# Patient Record
Sex: Male | Born: 1965 | Race: White | Hispanic: No | Marital: Married | Smoking: Current every day smoker
Health system: Southern US, Community
[De-identification: ages and names within clinical notes are randomized; demographics above are authoritative.]

## PROBLEM LIST (undated history)

## (undated) DIAGNOSIS — Z720 Tobacco use: Secondary | ICD-10-CM

## (undated) DIAGNOSIS — I5020 Unspecified systolic (congestive) heart failure: Secondary | ICD-10-CM

## (undated) DIAGNOSIS — I2109 ST elevation (STEMI) myocardial infarction involving other coronary artery of anterior wall: Secondary | ICD-10-CM

## (undated) DIAGNOSIS — I255 Ischemic cardiomyopathy: Secondary | ICD-10-CM

## (undated) DIAGNOSIS — I10 Essential (primary) hypertension: Secondary | ICD-10-CM

## (undated) DIAGNOSIS — I2510 Atherosclerotic heart disease of native coronary artery without angina pectoris: Secondary | ICD-10-CM

---

## 2012-07-25 ENCOUNTER — Ambulatory Visit: Payer: Self-pay | Admitting: Emergency Medicine

## 2012-07-30 ENCOUNTER — Ambulatory Visit: Payer: Self-pay | Admitting: Podiatry

## 2019-06-17 ENCOUNTER — Inpatient Hospital Stay (HOSPITAL_COMMUNITY)
Admission: AD | Admit: 2019-06-17 | Discharge: 2019-07-24 | DRG: 001 | Disposition: A | Discharge status: Rehabilitation Facility | Payer: BC Managed Care – PPO | Source: Other Acute Inpatient Hospital | Attending: Cardiothoracic Surgery | Admitting: Cardiothoracic Surgery

## 2019-06-17 ENCOUNTER — Emergency Department: Payer: BC Managed Care – PPO

## 2019-06-17 ENCOUNTER — Ambulatory Visit (INDEPENDENT_AMBULATORY_CARE_PROVIDER_SITE_OTHER)
Admission: EM | Admit: 2019-06-17 | Discharge: 2019-06-17 | Disposition: A | Discharge status: Home / Self Care | Payer: BC Managed Care – PPO | Source: Home / Self Care

## 2019-06-17 ENCOUNTER — Other Ambulatory Visit: Payer: Self-pay

## 2019-06-17 ENCOUNTER — Inpatient Hospital Stay
Admission: EM | Admit: 2019-06-17 | Discharge: 2019-06-17 | DRG: 270 | Disposition: A | Discharge status: Nursing Home (Includes ICF) | Payer: BC Managed Care – PPO | Attending: Internal Medicine | Admitting: Internal Medicine

## 2019-06-17 ENCOUNTER — Encounter
Admission: EM | Disposition: A | Discharge status: Nursing Home (Includes ICF) | Payer: Self-pay | Source: Home / Self Care | Attending: Internal Medicine

## 2019-06-17 ENCOUNTER — Encounter: Payer: Self-pay | Admitting: Physician Assistant

## 2019-06-17 DIAGNOSIS — E871 Hypo-osmolality and hyponatremia: Secondary | ICD-10-CM | POA: Diagnosis not present

## 2019-06-17 DIAGNOSIS — Z8249 Family history of ischemic heart disease and other diseases of the circulatory system: Secondary | ICD-10-CM

## 2019-06-17 DIAGNOSIS — I110 Hypertensive heart disease with heart failure: Secondary | ICD-10-CM | POA: Diagnosis present

## 2019-06-17 DIAGNOSIS — Z452 Encounter for adjustment and management of vascular access device: Secondary | ICD-10-CM

## 2019-06-17 DIAGNOSIS — I213 ST elevation (STEMI) myocardial infarction of unspecified site: Secondary | ICD-10-CM | POA: Diagnosis not present

## 2019-06-17 DIAGNOSIS — I420 Dilated cardiomyopathy: Secondary | ICD-10-CM | POA: Diagnosis present

## 2019-06-17 DIAGNOSIS — I340 Nonrheumatic mitral (valve) insufficiency: Secondary | ICD-10-CM | POA: Diagnosis not present

## 2019-06-17 DIAGNOSIS — E785 Hyperlipidemia, unspecified: Secondary | ICD-10-CM | POA: Diagnosis present

## 2019-06-17 DIAGNOSIS — Y95 Nosocomial condition: Secondary | ICD-10-CM | POA: Diagnosis not present

## 2019-06-17 DIAGNOSIS — Z95811 Presence of heart assist device: Secondary | ICD-10-CM

## 2019-06-17 DIAGNOSIS — D696 Thrombocytopenia, unspecified: Secondary | ICD-10-CM | POA: Diagnosis present

## 2019-06-17 DIAGNOSIS — Z6833 Body mass index (BMI) 33.0-33.9, adult: Secondary | ICD-10-CM

## 2019-06-17 DIAGNOSIS — R112 Nausea with vomiting, unspecified: Secondary | ICD-10-CM | POA: Diagnosis not present

## 2019-06-17 DIAGNOSIS — G4730 Sleep apnea, unspecified: Secondary | ICD-10-CM | POA: Diagnosis present

## 2019-06-17 DIAGNOSIS — R531 Weakness: Secondary | ICD-10-CM | POA: Diagnosis not present

## 2019-06-17 DIAGNOSIS — N289 Disorder of kidney and ureter, unspecified: Secondary | ICD-10-CM | POA: Diagnosis present

## 2019-06-17 DIAGNOSIS — J189 Pneumonia, unspecified organism: Secondary | ICD-10-CM | POA: Diagnosis not present

## 2019-06-17 DIAGNOSIS — E874 Mixed disorder of acid-base balance: Secondary | ICD-10-CM | POA: Diagnosis not present

## 2019-06-17 DIAGNOSIS — I5021 Acute systolic (congestive) heart failure: Secondary | ICD-10-CM | POA: Diagnosis not present

## 2019-06-17 DIAGNOSIS — I2101 ST elevation (STEMI) myocardial infarction involving left main coronary artery: Secondary | ICD-10-CM | POA: Diagnosis not present

## 2019-06-17 DIAGNOSIS — F419 Anxiety disorder, unspecified: Secondary | ICD-10-CM | POA: Diagnosis not present

## 2019-06-17 DIAGNOSIS — R570 Cardiogenic shock: Secondary | ICD-10-CM | POA: Diagnosis present

## 2019-06-17 DIAGNOSIS — I272 Pulmonary hypertension, unspecified: Secondary | ICD-10-CM | POA: Diagnosis present

## 2019-06-17 DIAGNOSIS — I5022 Chronic systolic (congestive) heart failure: Secondary | ICD-10-CM | POA: Diagnosis not present

## 2019-06-17 DIAGNOSIS — R0600 Dyspnea, unspecified: Secondary | ICD-10-CM

## 2019-06-17 DIAGNOSIS — I255 Ischemic cardiomyopathy: Secondary | ICD-10-CM | POA: Diagnosis present

## 2019-06-17 DIAGNOSIS — R079 Chest pain, unspecified: Secondary | ICD-10-CM

## 2019-06-17 DIAGNOSIS — Z9889 Other specified postprocedural states: Secondary | ICD-10-CM | POA: Diagnosis not present

## 2019-06-17 DIAGNOSIS — K5900 Constipation, unspecified: Secondary | ICD-10-CM | POA: Diagnosis not present

## 2019-06-17 DIAGNOSIS — J90 Pleural effusion, not elsewhere classified: Secondary | ICD-10-CM

## 2019-06-17 DIAGNOSIS — I4901 Ventricular fibrillation: Secondary | ICD-10-CM | POA: Diagnosis not present

## 2019-06-17 DIAGNOSIS — Z7901 Long term (current) use of anticoagulants: Secondary | ICD-10-CM | POA: Diagnosis not present

## 2019-06-17 DIAGNOSIS — Z20822 Contact with and (suspected) exposure to covid-19: Secondary | ICD-10-CM | POA: Diagnosis present

## 2019-06-17 DIAGNOSIS — I10 Essential (primary) hypertension: Secondary | ICD-10-CM | POA: Diagnosis present

## 2019-06-17 DIAGNOSIS — A419 Sepsis, unspecified organism: Secondary | ICD-10-CM | POA: Diagnosis not present

## 2019-06-17 DIAGNOSIS — C641 Malignant neoplasm of right kidney, except renal pelvis: Secondary | ICD-10-CM | POA: Diagnosis present

## 2019-06-17 DIAGNOSIS — I2102 ST elevation (STEMI) myocardial infarction involving left anterior descending coronary artery: Secondary | ICD-10-CM | POA: Diagnosis not present

## 2019-06-17 DIAGNOSIS — R7401 Elevation of levels of liver transaminase levels: Secondary | ICD-10-CM | POA: Diagnosis not present

## 2019-06-17 DIAGNOSIS — Z01818 Encounter for other preprocedural examination: Secondary | ICD-10-CM

## 2019-06-17 DIAGNOSIS — R5381 Other malaise: Secondary | ICD-10-CM | POA: Diagnosis not present

## 2019-06-17 DIAGNOSIS — E876 Hypokalemia: Secondary | ICD-10-CM | POA: Diagnosis not present

## 2019-06-17 DIAGNOSIS — Z515 Encounter for palliative care: Secondary | ICD-10-CM | POA: Diagnosis not present

## 2019-06-17 DIAGNOSIS — I472 Ventricular tachycardia: Secondary | ICD-10-CM | POA: Diagnosis not present

## 2019-06-17 DIAGNOSIS — J9601 Acute respiratory failure with hypoxia: Secondary | ICD-10-CM | POA: Diagnosis not present

## 2019-06-17 DIAGNOSIS — K7200 Acute and subacute hepatic failure without coma: Secondary | ICD-10-CM | POA: Diagnosis not present

## 2019-06-17 DIAGNOSIS — F17210 Nicotine dependence, cigarettes, uncomplicated: Secondary | ICD-10-CM | POA: Diagnosis present

## 2019-06-17 DIAGNOSIS — Z4509 Encounter for adjustment and management of other cardiac device: Secondary | ICD-10-CM

## 2019-06-17 DIAGNOSIS — I2109 ST elevation (STEMI) myocardial infarction involving other coronary artery of anterior wall: Secondary | ICD-10-CM | POA: Diagnosis present

## 2019-06-17 DIAGNOSIS — J181 Lobar pneumonia, unspecified organism: Secondary | ICD-10-CM | POA: Diagnosis not present

## 2019-06-17 DIAGNOSIS — I77819 Aortic ectasia, unspecified site: Secondary | ICD-10-CM | POA: Diagnosis present

## 2019-06-17 DIAGNOSIS — I5023 Acute on chronic systolic (congestive) heart failure: Secondary | ICD-10-CM

## 2019-06-17 DIAGNOSIS — I2510 Atherosclerotic heart disease of native coronary artery without angina pectoris: Secondary | ICD-10-CM | POA: Diagnosis present

## 2019-06-17 DIAGNOSIS — E669 Obesity, unspecified: Secondary | ICD-10-CM | POA: Diagnosis present

## 2019-06-17 DIAGNOSIS — I442 Atrioventricular block, complete: Secondary | ICD-10-CM | POA: Diagnosis not present

## 2019-06-17 DIAGNOSIS — R42 Dizziness and giddiness: Secondary | ICD-10-CM | POA: Diagnosis not present

## 2019-06-17 DIAGNOSIS — K5901 Slow transit constipation: Secondary | ICD-10-CM

## 2019-06-17 DIAGNOSIS — Z7189 Other specified counseling: Secondary | ICD-10-CM | POA: Diagnosis not present

## 2019-06-17 DIAGNOSIS — Z9289 Personal history of other medical treatment: Secondary | ICD-10-CM

## 2019-06-17 DIAGNOSIS — I959 Hypotension, unspecified: Secondary | ICD-10-CM | POA: Diagnosis present

## 2019-06-17 DIAGNOSIS — I509 Heart failure, unspecified: Secondary | ICD-10-CM | POA: Diagnosis not present

## 2019-06-17 DIAGNOSIS — Z01810 Encounter for preprocedural cardiovascular examination: Secondary | ICD-10-CM | POA: Diagnosis not present

## 2019-06-17 DIAGNOSIS — D649 Anemia, unspecified: Secondary | ICD-10-CM | POA: Diagnosis present

## 2019-06-17 DIAGNOSIS — Z9911 Dependence on respirator [ventilator] status: Secondary | ICD-10-CM | POA: Diagnosis not present

## 2019-06-17 DIAGNOSIS — I952 Hypotension due to drugs: Secondary | ICD-10-CM | POA: Diagnosis not present

## 2019-06-17 HISTORY — PX: LEFT HEART CATH AND CORONARY ANGIOGRAPHY: CATH118249

## 2019-06-17 HISTORY — DX: Essential (primary) hypertension: I10

## 2019-06-17 HISTORY — DX: Unspecified systolic (congestive) heart failure: I50.20

## 2019-06-17 HISTORY — PX: CORONARY/GRAFT ACUTE MI REVASCULARIZATION: CATH118305

## 2019-06-17 HISTORY — DX: ST elevation (STEMI) myocardial infarction involving other coronary artery of anterior wall: I21.09

## 2019-06-17 HISTORY — DX: Tobacco use: Z72.0

## 2019-06-17 HISTORY — PX: IABP INSERTION: CATH118242

## 2019-06-17 HISTORY — PX: RIGHT HEART CATH: CATH118263

## 2019-06-17 HISTORY — DX: Atherosclerotic heart disease of native coronary artery without angina pectoris: I25.10

## 2019-06-17 HISTORY — DX: Ischemic cardiomyopathy: I25.5

## 2019-06-17 LAB — BASIC METABOLIC PANEL
Anion gap: 12 (ref 5–15)
BUN: 17 mg/dL (ref 6–20)
CO2: 20 mmol/L — ABNORMAL LOW (ref 22–32)
Calcium: 8.6 mg/dL — ABNORMAL LOW (ref 8.9–10.3)
Chloride: 105 mmol/L (ref 98–111)
Creatinine, Ser: 0.95 mg/dL (ref 0.61–1.24)
GFR calc Af Amer: >60 mL/min (ref >60)
GFR calc non Af Amer: >60 mL/min (ref >60)
Glucose, Bld: 168 mg/dL — ABNORMAL HIGH (ref 70–99)
Potassium: 3.9 mmol/L (ref 3.5–5.1)
Sodium: 137 mmol/L (ref 135–145)

## 2019-06-17 LAB — TROPONIN I (HIGH SENSITIVITY): Troponin I (High Sensitivity): 202 ng/L (ref <18)

## 2019-06-17 LAB — CBC WITH DIFFERENTIAL/PLATELET
Abs Immature Granulocytes: 0.05 10*3/uL (ref 0.00–0.07)
Basophils Absolute: 0 10*3/uL (ref 0.0–0.1)
Basophils Relative: 0 %
Eosinophils Absolute: 0 10*3/uL (ref 0.0–0.5)
Eosinophils Relative: 0 %
HCT: 47.4 % (ref 39.0–52.0)
Hemoglobin: 16.6 g/dL (ref 13.0–17.0)
Immature Granulocytes: 0 %
Lymphocytes Relative: 10 %
Lymphs Abs: 1.2 10*3/uL (ref 0.7–4.0)
MCH: 33.7 pg (ref 26.0–34.0)
MCHC: 35 g/dL (ref 30.0–36.0)
MCV: 96.1 fL (ref 80.0–100.0)
Monocytes Absolute: 0.5 10*3/uL (ref 0.1–1.0)
Monocytes Relative: 4 %
Neutro Abs: 10.7 10*3/uL — ABNORMAL HIGH (ref 1.7–7.7)
Neutrophils Relative %: 86 %
Platelets: 172 10*3/uL (ref 150–400)
RBC: 4.93 MIL/uL (ref 4.22–5.81)
RDW: 12.7 % (ref 11.5–15.5)
WBC: 12.5 10*3/uL — ABNORMAL HIGH (ref 4.0–10.5)
nRBC: 0 % (ref 0.0–0.2)

## 2019-06-17 LAB — SARS CORONAVIRUS 2 BY RT PCR (HOSPITAL ORDER, PERFORMED IN ~~LOC~~ HOSPITAL LAB): SARS Coronavirus 2: NEGATIVE

## 2019-06-17 LAB — HEPATIC FUNCTION PANEL
ALT: 161 U/L — ABNORMAL HIGH (ref 0–44)
AST: 196 U/L — ABNORMAL HIGH (ref 15–41)
Albumin: 3.7 g/dL (ref 3.5–5.0)
Alkaline Phosphatase: 40 U/L (ref 38–126)
Bilirubin, Direct: 0.3 mg/dL — ABNORMAL HIGH (ref 0.0–0.2)
Indirect Bilirubin: 1.1 mg/dL — ABNORMAL HIGH (ref 0.3–0.9)
Total Bilirubin: 1.4 mg/dL — ABNORMAL HIGH (ref 0.3–1.2)
Total Protein: 6.3 g/dL — ABNORMAL LOW (ref 6.5–8.1)

## 2019-06-17 LAB — APTT: aPTT: 25 seconds (ref 24–36)

## 2019-06-17 LAB — POCT ACTIVATED CLOTTING TIME: Activated Clotting Time: 324 seconds

## 2019-06-17 LAB — BRAIN NATRIURETIC PEPTIDE: B Natriuretic Peptide: 751.1 pg/mL — ABNORMAL HIGH (ref 0.0–100.0)

## 2019-06-17 LAB — PROTIME-INR
INR: 1 (ref 0.8–1.2)
Prothrombin Time: 12.8 seconds (ref 11.4–15.2)

## 2019-06-17 SURGERY — CORONARY/GRAFT ACUTE MI REVASCULARIZATION
Anesthesia: Moderate Sedation

## 2019-06-17 MED ORDER — HEPARIN (PORCINE) 25000 UT/250ML-% IV SOLN: 1200.0000 [IU]/h | INTRAVENOUS | 0 refills | Status: DC

## 2019-06-17 MED ORDER — SODIUM CHLORIDE 0.9 % IV SOLN
INTRAVENOUS | Status: AC | PRN
  Administered 2019-06-17: 10 mL/h via INTRAVENOUS

## 2019-06-17 MED ORDER — HYDRALAZINE HCL 20 MG/ML IJ SOLN: 10.0000 mg | INTRAMUSCULAR | Status: AC | PRN

## 2019-06-17 MED ORDER — SODIUM CHLORIDE 0.9% FLUSH
3.0000 mL | Freq: Two times a day (BID) | INTRAVENOUS | Status: DC
  Administered 2019-06-18 – 2019-06-23 (×5): 3 mL via INTRAVENOUS

## 2019-06-17 MED ORDER — SODIUM CHLORIDE 0.9% IV SOLUTION: INTRAVENOUS | Status: DC | PRN

## 2019-06-17 MED ORDER — CHLORHEXIDINE GLUCONATE CLOTH 2 % EX PADS: 6.0000 | MEDICATED_PAD | Freq: Every day | CUTANEOUS | Status: DC

## 2019-06-17 MED ORDER — FUROSEMIDE 10 MG/ML IJ SOLN
INTRAMUSCULAR | Status: AC
  Filled 2019-06-17: qty 4

## 2019-06-17 MED ORDER — MORPHINE SULFATE (PF) 4 MG/ML IV SOLN: 4.0000 mg | Freq: Once | INTRAVENOUS | Status: AC

## 2019-06-17 MED ORDER — NOREPINEPHRINE BITARTRATE 1 MG/ML IV SOLN
INTRAVENOUS | Status: AC
  Filled 2019-06-17: qty 4

## 2019-06-17 MED ORDER — ASPIRIN 81 MG PO CHEW
324.0000 mg | CHEWABLE_TABLET | Freq: Once | ORAL | Status: AC
  Administered 2019-06-17: 324 mg via ORAL

## 2019-06-17 MED ORDER — ASPIRIN 81 MG PO CHEW
81.0000 mg | CHEWABLE_TABLET | Freq: Every day | ORAL | Status: DC
  Administered 2019-06-18 – 2019-06-24 (×7): 81 mg via ORAL
  Filled 2019-06-17 (×4): qty 1

## 2019-06-17 MED ORDER — SODIUM CHLORIDE 0.9 % IV SOLN: INTRAVENOUS | Status: DC

## 2019-06-17 MED ORDER — HEPARIN (PORCINE) 25000 UT/250ML-% IV SOLN
1200.0000 [IU]/h | INTRAVENOUS | Status: DC
  Administered 2019-06-17: 1200 [IU]/h via INTRAVENOUS

## 2019-06-17 MED ORDER — IOHEXOL 300 MG/ML  SOLN
INTRAMUSCULAR | Status: DC | PRN
  Administered 2019-06-17: 260 mL

## 2019-06-17 MED ORDER — HEPARIN BOLUS VIA INFUSION
4000.0000 [IU] | Freq: Once | INTRAVENOUS | Status: AC
  Administered 2019-06-17: 4000 [IU] via INTRAVENOUS
  Filled 2019-06-17: qty 4000

## 2019-06-17 MED ORDER — LABETALOL HCL 5 MG/ML IV SOLN: 10.0000 mg | INTRAVENOUS | Status: AC | PRN

## 2019-06-17 MED ORDER — FENTANYL CITRATE (PF) 100 MCG/2ML IJ SOLN
INTRAMUSCULAR | Status: DC | PRN
  Administered 2019-06-17 (×2): 25 ug via INTRAVENOUS

## 2019-06-17 MED ORDER — ONDANSETRON HCL 4 MG/2ML IJ SOLN
INTRAMUSCULAR | Status: AC
  Administered 2019-06-17: 4 mg via INTRAVENOUS
  Filled 2019-06-17: qty 2

## 2019-06-17 MED ORDER — BIVALIRUDIN BOLUS VIA INFUSION - CUPID
INTRAVENOUS | Status: DC | PRN
  Administered 2019-06-17: 76.2 mg via INTRAVENOUS

## 2019-06-17 MED ORDER — ONDANSETRON HCL 4 MG/2ML IJ SOLN: 4.0000 mg | Freq: Four times a day (QID) | INTRAMUSCULAR | Status: DC | PRN

## 2019-06-17 MED ORDER — SODIUM CHLORIDE 0.9 % IV SOLN: 250.0000 mL | INTRAVENOUS | Status: DC | PRN

## 2019-06-17 MED ORDER — SODIUM CHLORIDE 0.9% FLUSH: 3.0000 mL | INTRAVENOUS | Status: DC | PRN

## 2019-06-17 MED ORDER — SODIUM CHLORIDE 0.9% IV SOLUTION: INTRAVENOUS | Status: DC

## 2019-06-17 MED ORDER — HEPARIN (PORCINE) IN NACL 1000-0.9 UT/500ML-% IV SOLN
INTRAVENOUS | Status: DC | PRN
  Administered 2019-06-17: 1500 mL

## 2019-06-17 MED ORDER — BIVALIRUDIN TRIFLUOROACETATE 250 MG IV SOLR
INTRAVENOUS | Status: AC
  Filled 2019-06-17: qty 250

## 2019-06-17 MED ORDER — FUROSEMIDE 10 MG/ML IJ SOLN
INTRAMUSCULAR | Status: DC | PRN
  Administered 2019-06-17: 40 mg via INTRAVENOUS

## 2019-06-17 MED ORDER — MORPHINE SULFATE (PF) 4 MG/ML IV SOLN
INTRAVENOUS | Status: AC
  Administered 2019-06-17: 4 mg via INTRAVENOUS
  Filled 2019-06-17: qty 1

## 2019-06-17 MED ORDER — ONDANSETRON HCL 4 MG/2ML IJ SOLN: 4.0000 mg | Freq: Once | INTRAMUSCULAR | Status: AC

## 2019-06-17 MED ORDER — ACETAMINOPHEN 325 MG PO TABS: 650.0000 mg | ORAL_TABLET | ORAL | Status: DC | PRN

## 2019-06-17 MED ORDER — MIDAZOLAM HCL 2 MG/2ML IJ SOLN
INTRAMUSCULAR | Status: AC
  Filled 2019-06-17: qty 2

## 2019-06-17 MED ORDER — NITROGLYCERIN 0.4 MG SL SUBL
0.4000 mg | SUBLINGUAL_TABLET | SUBLINGUAL | Status: DC | PRN
  Administered 2019-06-17 (×2): 0.4 mg via SUBLINGUAL

## 2019-06-17 MED ORDER — IOHEXOL 350 MG/ML SOLN
100.0000 mL | Freq: Once | INTRAVENOUS | Status: AC | PRN
  Administered 2019-06-17: 100 mL via INTRAVENOUS

## 2019-06-17 MED ORDER — HEPARIN (PORCINE) 25000 UT/250ML-% IV SOLN
INTRAVENOUS | Status: AC
  Filled 2019-06-17: qty 250

## 2019-06-17 MED ORDER — SODIUM CHLORIDE 0.9 % IV SOLN
INTRAVENOUS | Status: DC | PRN
  Administered 2019-06-17: 1.75 mg/kg/h via INTRAVENOUS

## 2019-06-17 MED ORDER — FENTANYL CITRATE (PF) 100 MCG/2ML IJ SOLN
INTRAMUSCULAR | Status: AC
  Filled 2019-06-17: qty 2

## 2019-06-17 SURGICAL SUPPLY — 20 items
BALLN EUPHORA RX 2.5X15 (BALLOONS)
BALLN IABP SENSA PLUS 8F 50CC (BALLOONS) ×3
BALLOON EUPHORA RX 2.5X15 (BALLOONS) IMPLANT
BALLOON IABP SENS PLUS 8F 50CC (BALLOONS) ×1 IMPLANT
CATH INFINITI 5FR JL4 (CATHETERS) ×3 IMPLANT
CATH INFINITI JR4 5F (CATHETERS) ×3 IMPLANT
CATH SWANZ 7F THERMO (CATHETERS) ×3 IMPLANT
CATH VISTA GUIDE 6FR XB3.5 (CATHETERS) ×3 IMPLANT
DEVICE INFLAT 30 PLUS (MISCELLANEOUS) ×3 IMPLANT
GUIDEWIRE EMER 3M J .025X150CM (WIRE) ×3 IMPLANT
KIT MANI 3VAL PERCEP (MISCELLANEOUS) ×3 IMPLANT
NEEDLE PERC 18GX7CM (NEEDLE) ×3 IMPLANT
PACK CARDIAC CATH (CUSTOM PROCEDURE TRAY) ×3 IMPLANT
PROTECTION STATION PRESSURIZED (MISCELLANEOUS) ×3
SHEATH AVANTI 6FR X 11CM (SHEATH) ×3 IMPLANT
SHEATH AVANTI 7FRX11 (SHEATH) ×3 IMPLANT
SLEEVE REPOSITIONING LENGTH 30 (MISCELLANEOUS) ×3 IMPLANT
STATION PROTECTION PRESSURIZED (MISCELLANEOUS) ×1 IMPLANT
WIRE G HI TQ BMW 190 (WIRE) ×6 IMPLANT
WIRE GUIDERIGHT .035X150 (WIRE) ×3 IMPLANT

## 2019-06-17 NOTE — ED Triage Notes (Signed)
Pt to ED with sudden onset of chest pain at 1700 tonight while at work. Pain feels like pressure and radiating to his left arm and back. No cardiac hx. 324 ASA and 4 Nitro given with no relief. Pt arrived to ED on 2L of oxygen via [place] due to reported SOB but no hypoxia on RA per EMS. Pt is diaphoretic but alert and oriented.

## 2019-06-17 NOTE — ED Notes (Signed)
Left arm numbness and abd pain also reported.

## 2019-06-17 NOTE — ED Notes (Signed)
Pt arrived back to ED treatment room from CT. Cardiologist at bedside with pt and patient's sister.

## 2019-06-17 NOTE — ED Provider Notes (Signed)
[facility]    CSN: [id number] Arrival date & time: 06/17/19  1851      History   Chief Complaint Chief Complaint  Patient presents with  . Chest Pain    HPI Aaron [name] is a 54 y.o. male.   patient is a 54 y.o. male with a history of hypertension who presented with sudden onset of left sided chest pain.  Patient states that he was at work when he had sudden onset of severe chest pain in the middle of his chest radiating to his back and down his left arm.  Patient also reported tingling sensation in his left arm as well.  He denies having this previously.  Patient rated pain at a 7-9/10 and described as a tightness/pressure.  Patient reports she has not been on any blood pressure medications for at least a year.  Patient also presenting with shortness of breath and diaphoresis.     Past medical history of hypertension Patient Active Problem List   Diagnosis Date Noted  . STEMI (ST elevation myocardial infarction) ([place]) 06/17/2019    History reviewed. No pertinent surgical history.     Home Medications    Prior to Admission medications   Not on File    Family History History reviewed. No pertinent family history.  Social History Social History   Tobacco Use  . Smoking status: Current Every Day Smoker    Packs/day: 1.50    Years: 30.00    Pack years: 45.00  . Smokeless tobacco: Never Used  Substance Use Topics  . Alcohol use: Yes    Alcohol/week: 7.0 - 14.0 standard drinks    Types: 7 - 14 Standard drinks or equivalent per week  . Drug use: Not on file     Allergies   Patient has no known allergies.   Review of Systems Review of Systems as noted above in HPI.  Other systems reviewed and found to be negative   Physical Exam Triage Vital Signs ED Triage Vitals  Enc Vitals Group     BP 06/17/19 1906 (!) 127/103     Pulse Rate 06/17/19 1906 96     Resp 06/17/19 1906 (!) 24     Temp 06/17/19 1906 (!) 97.4 F (36.3 C)     Temp  Source 06/17/19 1906 Oral     SpO2 06/17/19 1906 100 %     Weight --      Height --      Head Circumference --      Peak Flow --      Pain Score 06/17/19 1905 8     Pain Loc --      Pain Edu? --      Excl. in [place]? --    No data found.  Updated Vital Signs BP 129/87   Pulse 94   Temp (!) 97.4 F (36.3 C) (Oral)   Resp 20   SpO2 99%    Physical Exam Constitutional:      Appearance: He is well-developed.     Comments: Diaphoretic with increased work of breathing  HENT:     Head: Normocephalic.  Eyes:     Pupils: Pupils are equal, round, and reactive to light.  Cardiovascular:     Rate and Rhythm: Normal rate and regular rhythm.     Pulses:          Carotid pulses are 2+ on the right side and 2+ on the left side.      Radial  pulses are 2+ on the right side and 2+ on the left side.       Dorsalis pedis pulses are 2+ on the right side and 2+ on the left side.       Posterior tibial pulses are 2+ on the right side and 2+ on the left side.     Heart sounds: Normal heart sounds. No murmur heard.   Pulmonary:     Effort: Pulmonary effort is normal. Tachypnea present. No respiratory distress.     Breath sounds: Normal breath sounds. No stridor.  Chest:     Chest wall: No mass, deformity or tenderness.  Abdominal:     General: Bowel sounds are normal.     Palpations: Abdomen is soft.  Musculoskeletal:        General: Normal range of motion.  Skin:    General: Skin is warm.     Capillary Refill: Capillary refill takes less than 2 seconds.     Comments: Diaphoretic  Neurological:     General: No focal deficit present.     Mental Status: He is alert and oriented to person, place, and time.      UC Treatments / Results  Labs (all labs ordered are listed, but only abnormal results are displayed) Labs Reviewed - No data to display  EKG ED ECG REPORT   Date: 06/17/2019  EKG Time: 7:11 PM  Rate: 96  Rhythm: normal sinus rhythm,  ST depression in 2,3,aVF, V4-V6. ST  elevation in aVR, V1-V3 Intervals:first-degree A-V block   Narrative Interpretation: 1st degree AV block.  Anterior infarct, possibly acute.  [place] ST abnormality, possible inferolateral subendocardial injury.  ACUTE MI.  Normal EKG     Radiology None in urgent care  Procedures Critical care Critical care time of 40 minutes.  Great care was necessary to treat or prevent imminent or life-threatening deterioration due to cardiac failure in the setting of acute STEMI.  Critical care time spent evaluating EKG, ordering medications, administering medications, discussion with nursing staff and patient.  Evaluation of patient exam, condition and vital signs.   Medications Ordered in UC Medications  aspirin chewable tablet 324 mg (324 mg Oral Given 06/17/19 1921)    Initial Impression / Assessment and Plan / UC Course  I have reviewed the triage vital signs and the nursing notes.  Pertinent labs & imaging results that were available during my care of the patient were reviewed by me and considered in my medical decision making (see chart for details).    Patient presents with left-sided chest pain radiating down left arm that began approximately 1 hour prior to arrival with increased shortness of breath and diaphoresis.  EKG done on arrival with ST elevations in V1 through V3 and AVR.  Reciprocal ST depression in leads II, 3, aVF, V4 through V6.  911 called for emergency transport as soon as EKG obtained.  Patient given 324 mg aspirin.  Patient given nitroglycerin sublingual x3 5 minutes apart.  No improvement in pain after the first 2.  Patient reported minimal improvement in pain after the third tablet.  Blood pressure after the second tablet of 129/87.  Blood pressure after the second time of 128/77.  Patient placed on nasal cannula at 4 L with saturations 94-96% heart rate 94. Final Clinical Impressions(s) / UC Diagnoses   Final diagnoses:  ST elevation myocardial infarction (STEMI),  unspecified artery [facility])     Discharge Instructions     EMS called after EKG obtained -Patient transferred  to [place] regional ER by EMS    ED Prescriptions    None     PDMP not reviewed this encounter.   Luvenia Redden, PA-C 06/17/19 2159

## 2019-06-17 NOTE — ED Provider Notes (Signed)
[facility] Emergency Department Provider Note  ____________________________________________   First MD Initiated Contact with Patient 06/17/19 1955     (approximate)  I have reviewed the triage vital signs and the nursing notes.   HISTORY  Chief Complaint Chest Pain    HPI Aaron [name] is a 54 y.o. male who is not seen a doctor in many years with a history of hypertension who comes in with sudden onset of chest pain.  Patient states that he was exerting himself when he had sudden onset of severe chest pain in the middle of his chest radiating to his back and down his left arm.  He states it is associate with a tingling sensation in his left arm and left leg as well as pain in his abdomen as well.  He denies having this previously.  Is been constant, severe, nothing makes it better, nothing makes it worse.  EKG with EMS was concerning for acute MI with ST elevation in V1 V2 V3 with ST depression in 2 3 aVF V4 through V6 so STEMI was initially activated in the field.  Patient denies any history of GI bleeding.  He states he had a little bit of shortness of breath for the past few days.    Medical.  Patient reports prior hypertension but denies being on any medicines.     There are no problems to display for this patient.   Prior to Admission medications   Not on File    Allergies Patient has no known allergies.  No family history on file.  Social History Due to acuity of the situation unable to get full social history   Review of Systems Constitutional: No fever/chills Eyes: No visual changes. ENT: No sore throat. Cardiovascular: Positive chest pain Respiratory: Positive shortness of breath Gastrointestinal: Positive abdominal pain no nausea, no vomiting.  No diarrhea.  No constipation. Genitourinary: Negative for dysuria. Musculoskeletal: Negative for back pain. Skin: Negative for rash. Neurological: Negative for headaches, focal  weakness.  Positive for tingling on the left side of his body All other ROS negative ____________________________________________   PHYSICAL EXAM:  VITAL SIGNS: ED Triage Vitals [06/17/19 1956]  Enc Vitals Group     BP      Pulse Rate 93     Resp (!) 25     Temp 97.8 F (36.6 C)     Temp Source Oral     SpO2 95 %     Weight 223 lb 15.8 oz (101.6 kg)     Height 5\' 9"  (1.753 m)     Head Circumference      Peak Flow      Pain Score 9     Pain Loc      Pain Edu?      Excl. in [place]?     Constitutional: Alert and oriented. Diaphoretic, appears in pain  Eyes: Conjunctivae are normal. EOMI. Head: Atraumatic. Nose: No congestion/rhinnorhea. Mouth/Throat: Mucous membranes are moist.   Neck: No stridor. Trachea Midline. FROM Cardiovascular: Normal rate, regular rhythm. Grossly normal heart sounds.  Good peripheral circulation. Respiratory: Normal respiratory effort.  No retractions. Lungs CTAB. Gastrointestinal: Soft and nontender. No distention. No abdominal bruits.  Musculoskeletal: No lower extremity tenderness nor edema.  No joint effusions. Neurologic:  Normal speech and language. No gross focal neurologic deficits are appreciated.  Skin:  Skin is warm, dry and intact. No rash noted. Psychiatric: Mood and affect are normal. Speech and behavior are normal. GU: Deferred  ____________________________________________   LABS (all labs ordered are listed, but only abnormal results are displayed)  Labs Reviewed  CBC WITH DIFFERENTIAL/PLATELET - Abnormal; Notable for the following components:      Result Value   WBC 12.5 (*)    Neutro Abs 10.7 (*)    All other components within normal limits  SARS CORONAVIRUS 2 BY RT PCR (HOSPITAL ORDER, [place] [facility])  BASIC METABOLIC PANEL  BRAIN NATRIURETIC PEPTIDE  HEPATIC FUNCTION PANEL  PROTIME-INR  APTT  TROPONIN I (HIGH SENSITIVITY)   ____________________________________________   ED ECG REPORT I, Vanessa Shields, the attending physician, personally viewed and interpreted this ECG.  EKG shows ST elevation in aVR V1 through V3 with ST depression in V4 through V6, 2 3 aVF, type I AV block ____________________________________________  RADIOLOGY   Official radiology report(s): CT Angio Chest/Abd/Pel for Dissection W and/or Wo Contrast  Result Date: 06/17/2019 CLINICAL DATA:  Thoracic aortic disease, post repair. Chest pain, left arm pain. EXAM: CT ANGIOGRAPHY CHEST, ABDOMEN AND PELVIS TECHNIQUE: Non-contrast CT of the chest was initially obtained. Multidetector CT imaging through the chest, abdomen and pelvis was performed using the standard protocol during bolus administration of intravenous contrast. Multiplanar reconstructed images and MIPs were obtained and reviewed to evaluate the vascular anatomy. CONTRAST:  143mL OMNIPAQUE IOHEXOL 350 MG/ML SOLN COMPARISON:  None. FINDINGS: CTA CHEST FINDINGS Cardiovascular: Mild cardiomegaly. Diffuse coronary artery calcifications. No visible aortic aneurysm or dissection. No evidence of prior repair. Maximum aortic diameter 3.6 cm in the ascending thoracic aorta. Mediastinum/Nodes: No mediastinal, hilar, or axillary adenopathy. Trachea and esophagus are unremarkable. Thyroid unremarkable. Lungs/Pleura: Lungs are clear. No focal airspace opacities or suspicious nodules. No effusions. Musculoskeletal: Chest wall soft tissues are unremarkable. No acute bony abnormality. Review of the MIP images confirms the above findings. CTA ABDOMEN AND PELVIS FINDINGS VASCULAR Aorta: Infrarenal aortic atherosclerosis. No aneurysm or dissection. Celiac: Patent without evidence of aneurysm, dissection, vasculitis or significant stenosis. SMA: Patent without evidence of aneurysm, dissection, vasculitis or significant stenosis. Renals: Both renal arteries are patent without evidence of aneurysm, dissection, vasculitis, fibromuscular dysplasia or significant stenosis. IMA: Patent without  evidence of aneurysm, dissection, vasculitis or significant stenosis. Inflow: Patent without evidence of aneurysm, dissection, vasculitis or significant stenosis. Scattered calcifications. Veins: No obvious venous abnormality within the limitations of this arterial phase study. Review of the MIP images confirms the above findings. NON-VASCULAR Hepatobiliary: Suspect diffuse fatty infiltration of the liver. No focal hepatic abnormality. Gallbladder unremarkable. Pancreas: No focal abnormality or ductal dilatation. Spleen: No focal abnormality.  Normal size. Adrenals/Urinary Tract: Low-density area noted in the midpole of the right kidney measuring 2.4 cm concerning for enhancing lesion. Small cyst off the lower pole of the right kidney measures 11 mm. No hydronephrosis. Adrenal glands and urinary bladder are unremarkable. Stomach/Bowel: Normal appendix. Stomach, large and small bowel grossly unremarkable. Lymphatic: No adenopathy Reproductive: No visible focal abnormality. Other: No free fluid or free air. Musculoskeletal: No acute bony abnormality. Review of the MIP images confirms the above findings. IMPRESSION: No evidence of aortic aneurysm or dissection. No visible evidence of prior repair. Diffuse coronary artery calcifications.  Aortic atherosclerosis. No acute cardiopulmonary disease. Concern for 2.4 cm enhancing lesion within the midpole of the right kidney. Cannot exclude renal cell carcinoma. When the patient is clinically stable and able to follow directions and hold their breath (preferably as an outpatient) further evaluation with dedicated abdominal MRI should be considered. Electronically Signed   By: Lennette Bihari  [place] M.D.   On: 06/17/2019 20:29    ____________________________________________   PROCEDURES  Procedure(s) performed (including Critical Care):  .Critical Care Performed by: Vanessa [place], MD Authorized by: Vanessa [place], MD   Critical care provider statement:    Critical care  time (minutes):  31   Critical care was necessary to treat or prevent imminent or life-threatening deterioration of the following conditions:  Cardiac failure   Critical care was time spent personally by me on the following activities:  Discussions with consultants, evaluation of patient's response to treatment, examination of patient, ordering and performing treatments and interventions, ordering and review of laboratory studies, ordering and review of radiographic studies, pulse oximetry, re-evaluation of patient's condition, obtaining history from patient or surrogate and review of old charts .1-3 Lead EKG Interpretation Performed by: Vanessa , MD Authorized by: Vanessa , MD     Interpretation: normal     ECG rate:  90s   ECG rate assessment: normal     Rhythm: sinus rhythm     Ectopy: none     Conduction: normal       ____________________________________________   INITIAL IMPRESSION / ASSESSMENT AND PLAN / ED COURSE   Vannie Hochstetler Guidotti was evaluated in Emergency Department on 06/17/2019 for the symptoms described in the history of present illness. He was evaluated in the context of the global COVID-19 pandemic, which necessitated consideration that the patient might be at risk for infection with the SARS-CoV-2 virus that causes COVID-19. Institutional protocols and algorithms that pertain to the evaluation of patients at risk for COVID-19 are in a state of rapid change based on information released by regulatory bodies including the CDC and federal and state organizations. These policies and algorithms were followed during the patient's care in the ED.    Patient comes in as STEMI activation however his story is concerning for the possibility of dissection.  Discussed with Dr. Clayborn Bigness.  Patient has no recent BMP but given the significant EKG changes in the setting of significant concerning story I think it is best to proceed with CT dissection before heparinizing patient.  I  discussed the case with Dr. Clayborn Bigness and he also agrees.  Patient himself consents for CT without waiting for kidney function back.  He understands there is a risk to his kidneys.  Patient already received full dose of aspirin with EMS.  We will keep patient on the cardiac monitor to evaluate for arrhythmia  DDx that was also considered d/t potential to cause harm, but was found less likely based on history and physical (as detailed above): -PNA (no fevers, cough but CXR to evaluate) -Symptomatic anemia (will get H&H)   Patient CT scan was negative.  Patient was already taken to the Cath Lab while waiting the results.  There was an incidental finding of a lesion in the kidney which I have messaged Dr. Clayborn Bigness about the hope that he will get follow-up for this outpatient.  Patient taken to the Cath Lab for further intervention       ____________________________________________   FINAL CLINICAL IMPRESSION(S) / ED DIAGNOSES   Final diagnoses:  ST elevation myocardial infarction (STEMI), unspecified artery ([place])     MEDICATIONS GIVEN DURING THIS VISIT:  Medications  ondansetron (ZOFRAN) injection 4 mg (4 mg Intravenous Given 06/17/19 2005)  morphine 4 MG/ML injection 4 mg (4 mg Intravenous Given 06/17/19 2005)  iohexol (OMNIPAQUE) 350 MG/ML injection 100 mL (100 mLs Intravenous Contrast Given 06/17/19 2011)  ED Discharge Orders    None       Note:  This document was prepared using Dragon voice recognition software and may include unintentional dictation errors.   Vanessa [place], MD 06/17/19 2035

## 2019-06-17 NOTE — Progress Notes (Signed)
19:45   Ch arrived at room in response to Code STEMI. Pt was to be taken to CT soon. Ch found from RN that no family was around at this time. Ch let ED intake staff know to page chaplain when family arrives.   20:39  Ch ws paged and called to be let known about family in the waiting area. Ch went to special recovery waiting area to meet with Pt's sister Aaron [name]. Aaron [name] was tearful and looking at the screen for the time when the procedure began. Aaron [name] let Ch know that Pt took longer to arrive at ED because he was hesitant about getting EMS to his neighborhood. Pt had asked her to take her to the ED; she lives twenty minutes away from him. They stopped at an urgent care facility before coming to the ED. Ch provided support to sister. Sister said that she will be alright, and that they have been getting prayer help from friends and family. Ch took her number and gave it to the staff at the Cath Lab for easier contact if needed.

## 2019-06-17 NOTE — Discharge Instructions (Addendum)
No evidence of aortic aneurysm or dissection. No visible evidence of prior repair.   Diffuse coronary artery calcifications.  Aortic atherosclerosis.   No acute cardiopulmonary disease.   Concern for 2.4 cm enhancing lesion within the midpole of the right kidney. Cannot exclude renal cell carcinoma. When the patient is clinically stable and able to follow directions and hold their breath (preferably as an outpatient) further evaluation with dedicated abdominal MRI should be considered.

## 2019-06-17 NOTE — Discharge Instructions (Addendum)
EMS called after EKG obtained -Patient transferred to [facility] regional ER by EMS

## 2019-06-17 NOTE — ED Triage Notes (Signed)
Patient in today c/o chest pain and left arm pain that started 45 minutes ago. Patient denies nausea. Patient cannot describe the pain.

## 2019-06-17 NOTE — H&P (Signed)
CARDIOLOGY CONSULT NOTE               Patient ID: Aaron [name] MRN: [id number] DOB/AGE: 55-Sep-1967 55 y.o.  Admit date: 06/17/2019 Referring Physician Dr. Jari Pigg emergency room Primary Physician none Primary Cardiologist [facility] Reason for Consultation STEMI  HPI: Patient was admitted as a STEMI complaining of significant new onset chest discomfort abdominal discomfort.  Patient went to urgent care around 5 PM after evaluation urgent care and EKG they called ambulance and he was brought to the emergency room during the ambulance ride he was a code STEMI with abnormal EKG with diffuse ST segment depression pain having 9 out of 10 chest pain and abdominal pain.  When the patient came to emergency room hemodynamically stable but he had some complaints of abdominal back pain with left arm numbness the emergency room physician thought it was prudent to rule out dissection so he went for CT of his chest and abdomen which was negative for dissection.  Patient was then taken directly to cardiac Cath Lab for evaluation and possible intervention.  Patient got aspirin morphine in the emergency room before being transported to the Cath Lab  Review of systems complete and found to be negative unless listed above     History reviewed. No pertinent past medical history.  No past surgical history on file.  No medications prior to admission.   Social History   Socioeconomic History  . Marital status: Married    Spouse name: Not on file  . Number of children: Not on file  . Years of education: Not on file  . Highest education level: Not on file  Occupational History  . Not on file  Tobacco Use  . Smoking status: Current Every Day Smoker    Packs/day: 1.50    Years: 30.00    Pack years: 45.00  . Smokeless tobacco: Never Used  Substance and Sexual Activity  . Alcohol use: Yes    Alcohol/week: 7.0 - 14.0 standard drinks    Types: 7 - 14 Standard drinks or equivalent per week  . Drug  use: Not on file  . Sexual activity: Not on file  Other Topics Concern  . Not on file  Social History Narrative  . Not on file   Social Determinants of Health   Financial Resource Strain:   . Difficulty of Paying Living Expenses:   Food Insecurity:   . Worried About Charity fundraiser in the Last Year:   . Arboriculturist in the Last Year:   Transportation Needs:   . Film/video editor (Medical):   Marland Kitchen Lack of Transportation (Non-Medical):   Physical Activity:   . Days of Exercise per Week:   . Minutes of Exercise per Session:   Stress:   . Feeling of Stress :   Social Connections:   . Frequency of Communication with Friends and Family:   . Frequency of Social Gatherings with Friends and Family:   . Attends Religious Services:   . Active Member of Clubs or Organizations:   . Attends Archivist Meetings:   Marland Kitchen Marital Status:   Intimate Partner Violence:   . Fear of Current or Ex-Partner:   . Emotionally Abused:   Marland Kitchen Physically Abused:   . Sexually Abused:     No family history on file.    Review of systems complete and found to be negative unless listed above      PHYSICAL EXAM  General: Well developed, well  nourished, in no acute distress HEENT:  Normocephalic and atramatic Neck:  No JVD.  Lungs: Clear bilaterally to auscultation and percussion. Heart: HRRR . Normal S1 and S2 without gallops or murmurs.  Abdomen: Bowel sounds are positive, abdomen soft and non-tender  Msk:  Back normal, normal gait. Normal strength and tone for age. Extremities: No clubbing, cyanosis or edema.   Neuro: Alert and oriented X 3. Psych:  Good affect, responds appropriately  Labs:   Lab Results  Component Value Date   WBC 12.5 (H) 06/17/2019   HGB 16.6 06/17/2019   HCT 47.4 06/17/2019   MCV 96.1 06/17/2019   PLT 172 06/17/2019    Recent Labs  Lab 06/17/19 2023  NA 137  K 3.9  CL 105  CO2 20*  BUN 17  CREATININE 0.95  CALCIUM 8.6*  PROT 6.3*  BILITOT  1.4*  ALKPHOS 40  ALT 161*  AST 196*  GLUCOSE 168*   No results found for: CKTOTAL, CKMB, CKMBINDEX, TROPONINI No results found for: CHOL No results found for: HDL No results found for: LDLCALC No results found for: TRIG No results found for: CHOLHDL No results found for: LDLDIRECT    Radiology: CT Angio Chest/Abd/Pel for Dissection W and/or Wo Contrast  Result Date: 06/17/2019 CLINICAL DATA:  Thoracic aortic disease, post repair. Chest pain, left arm pain. EXAM: CT ANGIOGRAPHY CHEST, ABDOMEN AND PELVIS TECHNIQUE: Non-contrast CT of the chest was initially obtained. Multidetector CT imaging through the chest, abdomen and pelvis was performed using the standard protocol during bolus administration of intravenous contrast. Multiplanar reconstructed images and MIPs were obtained and reviewed to evaluate the vascular anatomy. CONTRAST:  193mL OMNIPAQUE IOHEXOL 350 MG/ML SOLN COMPARISON:  None. FINDINGS: CTA CHEST FINDINGS Cardiovascular: Mild cardiomegaly. Diffuse coronary artery calcifications. No visible aortic aneurysm or dissection. No evidence of prior repair. Maximum aortic diameter 3.6 cm in the ascending thoracic aorta. Mediastinum/Nodes: No mediastinal, hilar, or axillary adenopathy. Trachea and esophagus are unremarkable. Thyroid unremarkable. Lungs/Pleura: Lungs are clear. No focal airspace opacities or suspicious nodules. No effusions. Musculoskeletal: Chest wall soft tissues are unremarkable. No acute bony abnormality. Review of the MIP images confirms the above findings. CTA ABDOMEN AND PELVIS FINDINGS VASCULAR Aorta: Infrarenal aortic atherosclerosis. No aneurysm or dissection. Celiac: Patent without evidence of aneurysm, dissection, vasculitis or significant stenosis. SMA: Patent without evidence of aneurysm, dissection, vasculitis or significant stenosis. Renals: Both renal arteries are patent without evidence of aneurysm, dissection, vasculitis, fibromuscular dysplasia or significant  stenosis. IMA: Patent without evidence of aneurysm, dissection, vasculitis or significant stenosis. Inflow: Patent without evidence of aneurysm, dissection, vasculitis or significant stenosis. Scattered calcifications. Veins: No obvious venous abnormality within the limitations of this arterial phase study. Review of the MIP images confirms the above findings. NON-VASCULAR Hepatobiliary: Suspect diffuse fatty infiltration of the liver. No focal hepatic abnormality. Gallbladder unremarkable. Pancreas: No focal abnormality or ductal dilatation. Spleen: No focal abnormality.  Normal size. Adrenals/Urinary Tract: Low-density area noted in the midpole of the right kidney measuring 2.4 cm concerning for enhancing lesion. Small cyst off the lower pole of the right kidney measures 11 mm. No hydronephrosis. Adrenal glands and urinary bladder are unremarkable. Stomach/Bowel: Normal appendix. Stomach, large and small bowel grossly unremarkable. Lymphatic: No adenopathy Reproductive: No visible focal abnormality. Other: No free fluid or free air. Musculoskeletal: No acute bony abnormality. Review of the MIP images confirms the above findings. IMPRESSION: No evidence of aortic aneurysm or dissection. No visible evidence of prior repair. Diffuse coronary artery calcifications.  Aortic atherosclerosis. No acute cardiopulmonary disease. Concern for 2.4 cm enhancing lesion within the midpole of the right kidney. Cannot exclude renal cell carcinoma. When the patient is clinically stable and able to follow directions and hold their breath (preferably as an outpatient) further evaluation with dedicated abdominal MRI should be considered. Electronically Signed   By: Rolm Baptise M.D.   On: 06/17/2019 20:29    EKG: Normal sinus rhythm with diffuse ST segment depression rate of 80  ASSESSMENT AND PLAN:  STEMI Abnormal EKG Hypotension Acute coronary syndrome Obesity Smoking Alcohol abuse . Plan Recommend admit as code  STEMI Cardiac cath with intention to treat with possible intervention Pain control with morphine aspirin nitrates Echocardiogram will be helpful for assessment of ventricular function Emergent cardiac cath with possible PCI and stent  Signed: Yolonda Kida MD 06/17/2019, 9:56 PM

## 2019-06-17 NOTE — Progress Notes (Signed)
ANTICOAGULATION CONSULT NOTE - Initial Consult  Pharmacy Consult for Heparin  Indication: chest pain/ACS  No Known Allergies  Patient Measurements: Height: 5\' 9"  (175.3 cm) Weight: 101.6 kg (223 lb 15.8 oz) IBW/kg (Calculated) : 70.7 Heparin Dosing Weight: 92.3 kg   Vital Signs: Temp: 97.8 F (36.6 C) (06/16 1956) Temp Source: Oral (06/16 1956) BP: 118/92 (06/16 2111) Pulse Rate: 93 (06/16 1956)  Labs: Recent Labs    06/17/19 1958 06/17/19 2023  HGB 16.6  --   HCT 47.4  --   PLT 172  --   APTT  --  25  LABPROT  --  12.8  INR  --  1.0  CREATININE  --  0.95  TROPONINIHS  --  202*    Estimated Creatinine Clearance: 103.3 mL/min (by C-G formula based on SCr of 0.95 mg/dL).   Medical History: History reviewed. No pertinent past medical history.  Medications:  No medications prior to admission.    Assessment: Pharmacy consulted to dose heparin in this 54 year old male admitted with STEMI. PTA med list not available but baseline aPTT and INR were subtherapeutic so probably no prior anticoag. CrCl = 103.3 ml/min  Goal of Therapy:  Heparin level 0.3-0.7 units/ml Monitor platelets by anticoagulation protocol: Yes   Plan:  Give 4000 units bolus x 1 Start heparin infusion at 1200 units/hr Check anti-Xa level in 6 hours and daily while on heparin Continue to monitor H&H and platelets  Keiran Sias D 06/17/2019,9:38 PM

## 2019-06-17 NOTE — ED Notes (Addendum)
Pt transported to cath lab.  

## 2019-06-18 ENCOUNTER — Inpatient Hospital Stay (HOSPITAL_COMMUNITY): Payer: BC Managed Care – PPO

## 2019-06-18 ENCOUNTER — Other Ambulatory Visit (HOSPITAL_COMMUNITY): Payer: Self-pay

## 2019-06-18 ENCOUNTER — Encounter (HOSPITAL_COMMUNITY): Payer: Self-pay | Admitting: Cardiovascular Disease

## 2019-06-18 DIAGNOSIS — I2102 ST elevation (STEMI) myocardial infarction involving left anterior descending coronary artery: Secondary | ICD-10-CM

## 2019-06-18 DIAGNOSIS — I340 Nonrheumatic mitral (valve) insufficiency: Secondary | ICD-10-CM

## 2019-06-18 DIAGNOSIS — I2101 ST elevation (STEMI) myocardial infarction involving left main coronary artery: Secondary | ICD-10-CM

## 2019-06-18 DIAGNOSIS — R570 Cardiogenic shock: Secondary | ICD-10-CM

## 2019-06-18 DIAGNOSIS — Z01810 Encounter for preprocedural cardiovascular examination: Secondary | ICD-10-CM

## 2019-06-18 DIAGNOSIS — I213 ST elevation (STEMI) myocardial infarction of unspecified site: Secondary | ICD-10-CM

## 2019-06-18 LAB — BASIC METABOLIC PANEL
Anion gap: 12 (ref 5–15)
Anion gap: 10 (ref 5–15)
Anion gap: 10 (ref 5–15)
BUN: 16 mg/dL (ref 6–20)
BUN: 15 mg/dL (ref 6–20)
BUN: 12 mg/dL (ref 6–20)
CO2: 22 mmol/L (ref 22–32)
CO2: 26 mmol/L (ref 22–32)
CO2: 25 mmol/L (ref 22–32)
Calcium: 9 mg/dL (ref 8.9–10.3)
Calcium: 8.9 mg/dL (ref 8.9–10.3)
Calcium: 8.8 mg/dL — ABNORMAL LOW (ref 8.9–10.3)
Chloride: 103 mmol/L (ref 98–111)
Chloride: 103 mmol/L (ref 98–111)
Chloride: 101 mmol/L (ref 98–111)
Creatinine, Ser: 1.02 mg/dL (ref 0.61–1.24)
Creatinine, Ser: 1.16 mg/dL (ref 0.61–1.24)
Creatinine, Ser: 0.86 mg/dL (ref 0.61–1.24)
GFR calc Af Amer: >60 mL/min (ref >60)
GFR calc Af Amer: >60 mL/min (ref >60)
GFR calc Af Amer: >60 mL/min (ref >60)
GFR calc non Af Amer: >60 mL/min (ref >60)
GFR calc non Af Amer: >60 mL/min (ref >60)
GFR calc non Af Amer: >60 mL/min (ref >60)
Glucose, Bld: 159 mg/dL — ABNORMAL HIGH (ref 70–99)
Glucose, Bld: 158 mg/dL — ABNORMAL HIGH (ref 70–99)
Glucose, Bld: 163 mg/dL — ABNORMAL HIGH (ref 70–99)
Potassium: 4.2 mmol/L (ref 3.5–5.1)
Potassium: 4 mmol/L (ref 3.5–5.1)
Potassium: 3.5 mmol/L (ref 3.5–5.1)
Sodium: 137 mmol/L (ref 135–145)
Sodium: 139 mmol/L (ref 135–145)
Sodium: 136 mmol/L (ref 135–145)

## 2019-06-18 LAB — COOXEMETRY PANEL
Carboxyhemoglobin: 1.7 % — ABNORMAL HIGH (ref 0.5–1.5)
Methemoglobin: 1 % (ref 0.0–1.5)
O2 Saturation: 58.3 %
Total hemoglobin: 16.4 g/dL — ABNORMAL HIGH (ref 12.0–16.0)

## 2019-06-18 LAB — TYPE AND SCREEN
ABO/RH(D): O NEG
Antibody Screen: NEGATIVE

## 2019-06-18 LAB — HIV ANTIBODY (ROUTINE TESTING W REFLEX): HIV Screen 4th Generation wRfx: NONREACTIVE

## 2019-06-18 LAB — CBC
HCT: 49.7 % (ref 39.0–52.0)
HCT: 49.1 % (ref 39.0–52.0)
Hemoglobin: 16.8 g/dL (ref 13.0–17.0)
Hemoglobin: 16.5 g/dL (ref 13.0–17.0)
MCH: 33.3 pg (ref 26.0–34.0)
MCH: 33.5 pg (ref 26.0–34.0)
MCHC: 33.8 g/dL (ref 30.0–36.0)
MCHC: 33.6 g/dL (ref 30.0–36.0)
MCV: 98.4 fL (ref 80.0–100.0)
MCV: 99.8 fL (ref 80.0–100.0)
Platelets: 157 10*3/uL (ref 150–400)
Platelets: 155 10*3/uL (ref 150–400)
RBC: 5.05 MIL/uL (ref 4.22–5.81)
RBC: 4.92 MIL/uL (ref 4.22–5.81)
RDW: 12.7 % (ref 11.5–15.5)
RDW: 12.8 % (ref 11.5–15.5)
WBC: 8.7 10*3/uL (ref 4.0–10.5)
WBC: 8.9 10*3/uL (ref 4.0–10.5)
nRBC: 0 % (ref 0.0–0.2)
nRBC: 0 % (ref 0.0–0.2)

## 2019-06-18 LAB — ABO/RH: ABO/RH(D): O NEG

## 2019-06-18 LAB — MRSA PCR SCREENING: MRSA by PCR: NEGATIVE

## 2019-06-18 LAB — HEPARIN LEVEL (UNFRACTIONATED)
Heparin Unfractionated: 0.43 IU/mL (ref 0.30–0.70)
Heparin Unfractionated: 0.35 IU/mL (ref 0.30–0.70)

## 2019-06-18 LAB — PROTIME-INR
INR: 1.2 (ref 0.8–1.2)
Prothrombin Time: 14.5 seconds (ref 11.4–15.2)

## 2019-06-18 LAB — BRAIN NATRIURETIC PEPTIDE: B Natriuretic Peptide: 1068.5 pg/mL — ABNORMAL HIGH (ref 0.0–100.0)

## 2019-06-18 LAB — TROPONIN I (HIGH SENSITIVITY)
Troponin I (High Sensitivity): 17602 ng/L (ref <18)
Troponin I (High Sensitivity): 17568 ng/L (ref <18)
Troponin I (High Sensitivity): >27000 ng/L (ref <18)

## 2019-06-18 MED ORDER — MORPHINE SULFATE (PF) 2 MG/ML IV SOLN: 2.0000 mg | INTRAVENOUS | Status: DC | PRN

## 2019-06-18 MED ORDER — MILRINONE LACTATE IN DEXTROSE 20-5 MG/100ML-% IV SOLN
0.1250 ug/kg/min | INTRAVENOUS | Status: DC
  Administered 2019-06-18 – 2019-06-19 (×3): 0.25 ug/kg/min via INTRAVENOUS
  Administered 2019-06-20: 0.125 ug/kg/min via INTRAVENOUS
  Administered 2019-06-20: 0.25 ug/kg/min via INTRAVENOUS
  Administered 2019-06-21 – 2019-06-22 (×2): 0.2 ug/kg/min via INTRAVENOUS
  Administered 2019-06-22 – 2019-06-24 (×3): 0.125 ug/kg/min via INTRAVENOUS
  Administered 2019-06-25: .125 ug/kg/min via INTRAVENOUS
  Administered 2019-06-26 – 2019-06-30 (×5): 0.125 ug/kg/min via INTRAVENOUS
  Administered 2019-07-01 – 2019-07-02 (×3): 0.25 ug/kg/min via INTRAVENOUS
  Administered 2019-07-02: 0.125 ug/kg/min via INTRAVENOUS
  Filled 2019-06-18 (×19): qty 100

## 2019-06-18 MED ORDER — ATORVASTATIN CALCIUM 80 MG PO TABS
80.0000 mg | ORAL_TABLET | Freq: Every day | ORAL | Status: DC
  Administered 2019-06-18 – 2019-07-10 (×22): 80 mg via ORAL
  Filled 2019-06-18 (×22): qty 1

## 2019-06-18 MED ORDER — HEPARIN (PORCINE) 25000 UT/250ML-% IV SOLN
1200.0000 [IU]/h | INTRAVENOUS | Status: DC
  Administered 2019-06-18: 1200 [IU]/h via INTRAVENOUS
  Filled 2019-06-18: qty 250

## 2019-06-18 MED ORDER — TRAZODONE HCL 50 MG PO TABS
100.0000 mg | ORAL_TABLET | Freq: Every evening | ORAL | Status: DC | PRN
  Administered 2019-06-18 – 2019-07-05 (×15): 100 mg via ORAL
  Filled 2019-06-18 (×16): qty 2

## 2019-06-18 MED ORDER — FUROSEMIDE 10 MG/ML IJ SOLN
40.0000 mg | Freq: Once | INTRAMUSCULAR | Status: AC
  Administered 2019-06-18: 40 mg via INTRAVENOUS
  Filled 2019-06-18: qty 4

## 2019-06-18 MED ORDER — CHLORHEXIDINE GLUCONATE CLOTH 2 % EX PADS
6.0000 | MEDICATED_PAD | Freq: Every day | CUTANEOUS | Status: DC
  Administered 2019-06-18 – 2019-07-24 (×34): 6 via TOPICAL

## 2019-06-18 MED ORDER — DIGOXIN 125 MCG PO TABS
0.1250 mg | ORAL_TABLET | Freq: Every day | ORAL | Status: DC
  Administered 2019-06-18 – 2019-06-24 (×7): 0.125 mg via ORAL
  Filled 2019-06-18 (×7): qty 1

## 2019-06-18 MED ORDER — SPIRONOLACTONE 12.5 MG HALF TABLET
12.5000 mg | ORAL_TABLET | Freq: Every day | ORAL | Status: DC
  Administered 2019-06-18: 12.5 mg via ORAL
  Filled 2019-06-18: qty 1

## 2019-06-18 MED ORDER — SODIUM CHLORIDE 0.9 % IV SOLN: INTRAVENOUS | Status: DC | PRN

## 2019-06-18 MED ORDER — SACUBITRIL-VALSARTAN 24-26 MG PO TABS
1.0000 | ORAL_TABLET | Freq: Two times a day (BID) | ORAL | Status: DC
  Administered 2019-06-18 – 2019-06-24 (×12): 1 via ORAL
  Filled 2019-06-18 (×17): qty 1

## 2019-06-18 MED ORDER — MIDAZOLAM HCL 2 MG/2ML IJ SOLN
1.0000 mg | INTRAMUSCULAR | Status: DC | PRN
  Administered 2019-06-18: 1 mg via INTRAVENOUS
  Filled 2019-06-18: qty 2

## 2019-06-18 MED ORDER — NITROGLYCERIN 0.4 MG SL SUBL: 0.4000 mg | SUBLINGUAL_TABLET | SUBLINGUAL | Status: DC | PRN

## 2019-06-18 MED ORDER — SODIUM CHLORIDE 0.9% FLUSH: 10.0000 mL | INTRAVENOUS | Status: DC | PRN

## 2019-06-18 MED ORDER — FENTANYL CITRATE (PF) 100 MCG/2ML IJ SOLN
25.0000 ug | INTRAMUSCULAR | Status: DC | PRN
  Administered 2019-06-25: 200 ug via INTRAVENOUS
  Administered 2019-06-25: 25 ug via INTRAVENOUS
  Administered 2019-06-25: 250 ug via INTRAVENOUS
  Administered 2019-06-25 (×6): 100 ug via INTRAVENOUS
  Administered 2019-06-25: 200 ug via INTRAVENOUS
  Administered 2019-06-26: 25 ug via INTRAVENOUS
  Filled 2019-06-18 (×2): qty 2

## 2019-06-18 MED ORDER — SODIUM CHLORIDE 0.9% FLUSH
10.0000 mL | Freq: Two times a day (BID) | INTRAVENOUS | Status: DC
  Administered 2019-06-18 – 2019-06-19 (×2): 10 mL

## 2019-06-18 MED ORDER — NITROGLYCERIN IN D5W 200-5 MCG/ML-% IV SOLN
0.0000 ug/min | INTRAVENOUS | Status: DC
  Administered 2019-06-18: 5 ug/min via INTRAVENOUS
  Administered 2019-06-18: 60 ug/min via INTRAVENOUS
  Filled 2019-06-18 (×2): qty 250

## 2019-06-18 MED ORDER — MORPHINE SULFATE (PF) 2 MG/ML IV SOLN
2.0000 mg | INTRAVENOUS | Status: DC | PRN
  Administered 2019-06-18 – 2019-06-20 (×6): 2 mg via INTRAVENOUS
  Filled 2019-06-18 (×6): qty 1

## 2019-06-18 MED ORDER — PNEUMOCOCCAL VAC POLYVALENT 25 MCG/0.5ML IJ INJ: 0.5000 mL | INJECTION | INTRAMUSCULAR | Status: DC | PRN

## 2019-06-18 MED ORDER — FUROSEMIDE 10 MG/ML IJ SOLN
40.0000 mg | Freq: Two times a day (BID) | INTRAMUSCULAR | Status: DC
  Administered 2019-06-18 – 2019-06-19 (×2): 40 mg via INTRAVENOUS
  Filled 2019-06-18 (×2): qty 4

## 2019-06-18 MED ORDER — MORPHINE SULFATE (PF) 2 MG/ML IV SOLN
INTRAVENOUS | Status: AC
  Administered 2019-06-18: 2 mg via INTRAVENOUS
  Filled 2019-06-18: qty 1

## 2019-06-18 MED ORDER — ORAL CARE MOUTH RINSE
15.0000 mL | Freq: Two times a day (BID) | OROMUCOSAL | Status: DC
  Administered 2019-06-18 – 2019-06-24 (×9): 15 mL via OROMUCOSAL

## 2019-06-18 NOTE — Progress Notes (Signed)
Pre cabg has been completed.   Preliminary results in CV Proc.   Abram Sander 06/18/2019 2:36 PM

## 2019-06-18 NOTE — Progress Notes (Addendum)
Progress Note  Patient Name: Aaron [name] Date of Encounter: 06/18/2019  [facility] Cardiologist: No primary care provider on file. Dr. Clayborn Bigness new  Subjective   Currently in bed, balloon pump in place.  Having mild to moderate chest discomfort.  Breathing fairly comfortably.  Inpatient Medications    Scheduled Meds: . aspirin  81 mg Oral Daily  . atorvastatin  80 mg Oral Daily  . Chlorhexidine Gluconate Cloth  6 each Topical Daily  . mouth rinse  15 mL Mouth Rinse BID  . sodium chloride flush  10-40 mL Intracatheter Q12H  . sodium chloride flush  3 mL Intravenous Q12H   Continuous Infusions: . sodium chloride 10 mL/hr at 06/18/19 0800  . sodium chloride 10 mL/hr at 06/18/19 0800  . sodium chloride 10 mL/hr at 06/18/19 0800  . heparin 1,200 Units/hr (06/18/19 0800)  . nitroGLYCERIN 45 mcg/min (06/18/19 0800)   PRN Meds: sodium chloride, sodium chloride, sodium chloride, acetaminophen, morphine injection, nitroGLYCERIN, ondansetron (ZOFRAN) IV, [START ON 06/22/2019] pneumococcal 23 valent vaccine, sodium chloride flush, sodium chloride flush   Vital Signs    Vitals:   06/18/19 0636 06/18/19 0645 06/18/19 0700 06/18/19 0800  BP:   (!) 122/97 118/82  Pulse: 84 (!) 163 84 (!) 115  Resp: 11 11 10 13   Temp:  97.9 F (36.6 C) 98.1 F (36.7 C) 97.9 F (36.6 C)  TempSrc:   Oral Oral  SpO2: 99% 98% 98% 98%  Weight:      Height:        Intake/Output Summary (Last 24 hours) at 06/18/2019 0816 Last data filed at 06/18/2019 0800 Gross per 24 hour  Intake 402.79 ml  Output 1575 ml  Net -1172.21 ml   Last 3 Weights 06/18/2019 06/18/2019 06/17/2019  Weight (lbs) 210 lb 8.6 oz 223 lb 15.8 oz 223 lb 15.8 oz  Weight (kg) 95.5 kg 101.6 kg 101.6 kg      Telemetry    5 beats of nonsustained ventricular tachycardia otherwise sinus rhythm- Personally Reviewed  ECG    Sinus rhythm anterior wall ST elevation Q waves present- Personally Reviewed  Physical Exam   GEN: No  acute distress.   Neck: No JVD Cardiac: RRR, intra-aortic balloon pump noise in place, no rubs, or gallops.  Respiratory: Clear to auscultation bilaterally. GI: Soft, nontender, non-distended  MS: No edema; No deformity. Neuro:  Nonfocal  Psych: Normal affect   Labs    High Sensitivity Troponin:   Recent Labs  Lab 06/17/19 2023 06/18/19 0119 06/18/19 0424  TROPONINIHS 202* 17,602* 17,568*      Chemistry Recent Labs  Lab 06/17/19 2023 06/18/19 0119  NA 137 137  K 3.9 4.2  CL 105 103  CO2 20* 22  GLUCOSE 168* 159*  BUN 17 16  CREATININE 0.95 1.02  CALCIUM 8.6* 9.0  PROT 6.3*  --   ALBUMIN 3.7  --   AST 196*  --   ALT 161*  --   ALKPHOS 40  --   BILITOT 1.4*  --   GFRNONAA >60 >60  GFRAA >60 >60  ANIONGAP 12 12     Hematology Recent Labs  Lab 06/17/19 1958 06/18/19 0119 06/18/19 0424  WBC 12.5* 8.7 8.9  RBC 4.93 5.05 4.92  HGB 16.6 16.8 16.5  HCT 47.4 49.7 49.1  MCV 96.1 98.4 99.8  MCH 33.7 33.3 33.5  MCHC 35.0 33.8 33.6  RDW 12.7 12.7 12.8  PLT 172 157 155    BNP Recent Labs  Lab 06/17/19 2023 06/18/19 0119  BNP 751.1* 1,068.5*     DDimer No results for input(s): DDIMER in the last 168 hours.   Radiology    CARDIAC CATHETERIZATION  Result Date: 06/17/2019  Mid LM lesion is 75% stenosed.  Ost LAD lesion is 100% stenosed.  Ost Cx to Prox Cx lesion is 100% stenosed.  Ost RCA lesion is 100% stenosed.  Hemodynamic findings consistent with severe pulmonary hypertension.  Intra-aortic balloon pump was placed to help with hemodynamic stability  Intervention was deferred unable to cross with a wire ostial LAD  Intervention was otherwise deferred in favor of bypass surgery  Conclusion Evidence of moderate to severe pulmonary hypertension Mean wedge of 43 mean PA of 53 Severe dilated cardiomyopathy probably ischemic ejection fraction less than 25% Moderate calcification of the proximal coronary arteries Multiple CTO's ostial LAD proximal  circumflex ostial RCA Mild to moderate collaterals right to left to the LAD Minimal right to right collaterals Minimal left to left collaterals Coronaries 75% mid to distal left main 100% ostial LAD probable CTO 100% proximal circumflex probable CTO 100% ostial RCA probable CTO Collaterals as described Intervention was deferred after unable to cross with the wire for what was thought to be suspected CTO's of the ostial LAD Intra-aortic balloon pump was placed right femoral artery to help with hemodynamic stability 7 [place] Swan was placed on left and the PA position to help with hemodynamic monitoring Patient was placed on IV heparin for anticoagulation Arrangements were made for transfer to [facility] for possible ICU CCU care and possible evaluation for coronary bypass surgery Dr. Nechama Guard accepted the patient in transfer to CCU:   CT Angio Chest/Abd/Pel for Dissection W and/or Wo Contrast  Result Date: 06/17/2019 CLINICAL DATA:  Thoracic aortic disease, post repair. Chest pain, left arm pain. EXAM: CT ANGIOGRAPHY CHEST, ABDOMEN AND PELVIS TECHNIQUE: Non-contrast CT of the chest was initially obtained. Multidetector CT imaging through the chest, abdomen and pelvis was performed using the standard protocol during bolus administration of intravenous contrast. Multiplanar reconstructed images and MIPs were obtained and reviewed to evaluate the vascular anatomy. CONTRAST:  122mL OMNIPAQUE IOHEXOL 350 MG/ML SOLN COMPARISON:  None. FINDINGS: CTA CHEST FINDINGS Cardiovascular: Mild cardiomegaly. Diffuse coronary artery calcifications. No visible aortic aneurysm or dissection. No evidence of prior repair. Maximum aortic diameter 3.6 cm in the ascending thoracic aorta. Mediastinum/Nodes: No mediastinal, hilar, or axillary adenopathy. Trachea and esophagus are unremarkable. Thyroid unremarkable. Lungs/Pleura: Lungs are clear. No focal airspace opacities or suspicious nodules. No effusions. Musculoskeletal: Chest  wall soft tissues are unremarkable. No acute bony abnormality. Review of the MIP images confirms the above findings. CTA ABDOMEN AND PELVIS FINDINGS VASCULAR Aorta: Infrarenal aortic atherosclerosis. No aneurysm or dissection. Celiac: Patent without evidence of aneurysm, dissection, vasculitis or significant stenosis. SMA: Patent without evidence of aneurysm, dissection, vasculitis or significant stenosis. Renals: Both renal arteries are patent without evidence of aneurysm, dissection, vasculitis, fibromuscular dysplasia or significant stenosis. IMA: Patent without evidence of aneurysm, dissection, vasculitis or significant stenosis. Inflow: Patent without evidence of aneurysm, dissection, vasculitis or significant stenosis. Scattered calcifications. Veins: No obvious venous abnormality within the limitations of this arterial phase study. Review of the MIP images confirms the above findings. NON-VASCULAR Hepatobiliary: Suspect diffuse fatty infiltration of the liver. No focal hepatic abnormality. Gallbladder unremarkable. Pancreas: No focal abnormality or ductal dilatation. Spleen: No focal abnormality.  Normal size. Adrenals/Urinary Tract: Low-density area noted in the midpole of the right kidney measuring 2.4 cm concerning for  enhancing lesion. Small cyst off the lower pole of the right kidney measures 11 mm. No hydronephrosis. Adrenal glands and urinary bladder are unremarkable. Stomach/Bowel: Normal appendix. Stomach, large and small bowel grossly unremarkable. Lymphatic: No adenopathy Reproductive: No visible focal abnormality. Other: No free fluid or free air. Musculoskeletal: No acute bony abnormality. Review of the MIP images confirms the above findings. IMPRESSION: No evidence of aortic aneurysm or dissection. No visible evidence of prior repair. Diffuse coronary artery calcifications.  Aortic atherosclerosis. No acute cardiopulmonary disease. Concern for 2.4 cm enhancing lesion within the midpole of the  right kidney. Cannot exclude renal cell carcinoma. When the patient is clinically stable and able to follow directions and hold their breath (preferably as an outpatient) further evaluation with dedicated abdominal MRI should be considered. Electronically Signed   By: Rolm Baptise M.D.   On: 06/17/2019 20:29    Cardiac Studies   Cardiac catheterization angiogram personally reviewed-extremely severe triple-vessel disease. Diagnostic Dominance: Right   Mid LM lesion is 75% stenosed.  Ost LAD lesion is 100% stenosed.  Ost Cx to Prox Cx lesion is 100% stenosed.  Ost RCA lesion is 100% stenosed.  Hemodynamic findings consistent with severe pulmonary hypertension.  Intra-aortic balloon pump was placed to help with hemodynamic stability  Intervention was deferred unable to cross with a wire ostial LAD  Intervention was otherwise deferred in favor of bypass surgery   Conclusion Evidence of moderate to severe pulmonary hypertension Mean wedge of 43 mean PA of 53 Severe dilated cardiomyopathy probably ischemic ejection fraction less than 25% Moderate calcification of the proximal coronary arteries Multiple CTO's ostial LAD proximal circumflex ostial RCA Mild to moderate collaterals right to left to the LAD Minimal right to right collaterals Minimal left to left collaterals Coronaries 75% mid to distal left main 100% ostial LAD probable CTO 100% proximal circumflex probable CTO 100% ostial RCA probable CTO Collaterals as described Intervention was deferred after unable to cross with the wire for what was thought to be suspected CTO's of the ostial LAD Intra-aortic balloon pump was placed right femoral artery to help with hemodynamic stability 7 [place] Swan was placed on left and the PA position to help with hemodynamic monitoring Patient was placed on IV heparin for anticoagulation Arrangements were made for transfer to [facility] for possible ICU CCU care and possible  evaluation for coronary bypass surgery Dr. Nechama Guard accepted the patient in transfer to CCU:  Patient Profile     54 y.o. 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  Assessment & Plan    Anterior ST elevation myocardial infarction Cardiogenic shock-wedge pressure 43 in Cath Lab Severe ischemic cardiomyopathy, acute systolic heart failure-EF 25% Diabetic smoker.  -Currently intraaortic balloon pump in place.  Mentating well.  Question would he benefit from [place]ella while awaiting potential CABG. -Dr. Orvan Seen with cardiothoracic surgery aware (discussed with surgery team here) -IV nitroglycerin, IV heparin, aspirin, high intensity statin -Swan-Ganz catheter in place -We will get advanced heart failure team involved -Currently appears fairly comfortable in bed. -High-sensitivity troponin value has increased to 17,000.  CRITICAL CARE Performed by: Candee Furbish   Total critical care time: 40 minutes  Critical care time was exclusive of separately billable procedures and treating other patients.  Critical care was necessary to treat or prevent imminent or life-threatening deterioration.  Critical care was time spent personally by me on the following activities: development  of treatment plan with patient and/or surrogate as well as nursing, discussions with consultants, evaluation of patient's response to treatment, examination of patient, obtaining history from patient or surrogate, ordering and performing treatments and interventions, ordering and review of laboratory studies, ordering and review of radiographic studies, pulse oximetry and re-evaluation of patient's condition.     For questions or updates, please contact [place] Please consult [email or web address] for contact info under        Signed, Candee Furbish, MD  06/18/2019, 8:16 AM

## 2019-06-18 NOTE — Progress Notes (Signed)
  EKG CRITICAL VALUE     12 lead EKG performed.  Critical value noted.  Monna Fam, RN notified.   Zekiah Caruth, CCT 06/18/2019 8:02 AM

## 2019-06-18 NOTE — Consult Note (Addendum)
Advanced Heart Failure Team Consult Note   Primary Physician: Patient, No Pcp Per PCP-Cardiologist:  Dr. Marlou Porch (new)  AHF: Dr. Haroldine Laws (new)  Reason for Consultation: Post MI Cardiogenic Shock, Optimization prior to anticipated CABG   HPI:    Aaron [name] is seen today for evaluation of post MI cardiogenic shock/ optimization prior to anticipated CABG, at the request of Dr. Marlou Porch, Cardiology.   54 y/o male w/ PMH of untreated HTN and tobacco use (30 pack years) but no prior cardiac history, admitted to [facility] 6/16 w/ acute anterolateral STEMI. Emergent cath showed severe multivessel disease w/ 75% mid- distal LM, 100% prox LAD, not amendable to PCI (unable to cross w/ guide wire, probable CTO), 100% prox LCx (probable CTO) and 100% ostial RCA (probable CTO). There were mild to moderate collaterals right to left to the LAD, minimal right to right collaterals an minimal left to left collaterals. RHC demonstrated severe pulmonary HTN and elevated LVEDP. LVG demonstrated severely reduced LVEF, 25%, and mild-mod AI. IABP + Gordy Councilman cath placed and pt transferred to [facility] for further management + surgical consultation for CABG.   He has been evaluated by Dr. Orvan Seen. CABG tentatively scheduled for tomorrow.   Pertinent labs: Hs Troponin peaked at 17,602 (now trending down). BNP 1,068. CBC unremarkable. SCr 1.02. K 4.2. Hepatic enzymes elevated, AST 196, ALD 161.  Remains on IABP 1:1. Good argumentation.   Swan#s CVP 8 PAP 56/29 PCWP 32 CO 4.4 CI 2.1 PVR 201 SVR 1607  He has been started on milrinone 0.25 mcg/kg/min. On IV heparin + NTG gtt. BP 138/92.  Currently CP free.      Emergent R/LHC 06/18/19  Mid LM lesion is 75% stenosed.  Ost LAD lesion is 100% stenosed.  Ost Cx to Prox Cx lesion is 100% stenosed.  Ost RCA lesion is 100% stenosed.  Hemodynamic findings consistent with severe pulmonary hypertension.  Intra-aortic balloon pump was placed to help with hemodynamic  stability  Intervention was deferred unable to cross with a wire ostial LAD  Intervention was otherwise deferred in favor of bypass surgery   Right Heart Pressures Hemodynamic findings consistent with severe pulmonary hypertension. Elevated LV EDP consistent with volume overload. Ao 114/97 mean of 102 LV 967/59 and diastolic of 55 Wedge mean of 43 A-wave 43 V wave 50 PA systolic 65 diastolic of 45 mean of 53 RV 16/38-GYK RV end diastolic of 20 AO sat was 98 PA sat was 66   LVG  All segments of the heart are hypokinetic.  Globally depressed left ventricular function severely ejection fraction 25% Mild to moderate AI    2D Echo- pending   Review of Systems: [y] = yes, [ ]  = no   . General: Weight gain [ ] ; Weight loss [ ] ; Anorexia [ ] ; Fatigue [ ] ; Fever [ ] ; Chills [ ] ; Weakness [ ]   . Cardiac: Chest pain/pressure [ ] ; Resting SOB [ ] ; Exertional SOB [ ] ; Orthopnea [ ] ; Pedal Edema [ ] ; Palpitations [ ] ; Syncope [ ] ; Presyncope [ ] ; Paroxysmal nocturnal dyspnea[ ]   . Pulmonary: Cough [ ] ; Wheezing[ ] ; Hemoptysis[ ] ; Sputum [ ] ; Snoring [ ]   . GI: Vomiting[ ] ; Dysphagia[ ] ; Melena[ ] ; Hematochezia [ ] ; Heartburn[ ] ; Abdominal pain [ ] ; Constipation [ ] ; Diarrhea [ ] ; BRBPR [ ]   . GU: Hematuria[ ] ; Dysuria [ ] ; Nocturia[ ]   . Vascular: Pain in legs with walking [ ] ; Pain in feet with lying flat [ ] ;  Non-healing sores [ ] ; Stroke [ ] ; TIA [ ] ; Slurred speech [ ] ;  . Neuro: Headaches[ ] ; Vertigo[ ] ; Seizures[ ] ; Paresthesias[ ] ;Blurred vision [ ] ; Diplopia [ ] ; Vision changes [ ]   . Ortho/Skin: Arthritis [ ] ; Joint pain [ ] ; Muscle pain [ ] ; Joint swelling [ ] ; Back Pain [ ] ; Rash [ ]   . Psych: Depression[ ] ; Anxiety[ ]   . Heme: Bleeding problems [ ] ; Clotting disorders [ ] ; Anemia [ ]   . Endocrine: Diabetes [ ] ; Thyroid dysfunction[ ]   Home Medications Prior to Admission medications   Medication Sig Start Date End Date Taking? Authorizing Provider  heparin 25000-0.45 UT/250ML-%  infusion Inject 1,200 Units/hr into the vein continuous. 06/17/19   Yolonda Kida, MD    Past Medical History: Past Medical History:  Diagnosis Date  . Acute anterolateral wall MI ([place])   . CAD, multiple vessel   . Hypertension   . Ischemic cardiomyopathy   . Systolic heart failure ([place])   . Tobacco abuse     Past Surgical History: Past Surgical History:  Procedure Laterality Date  . CORONARY/GRAFT ACUTE MI REVASCULARIZATION N/A 06/17/2019   Procedure: Coronary/Graft Acute MI Revascularization;  Surgeon: Yolonda Kida, MD;  Location: [place] [facility];  Service: Cardiovascular;  Laterality: N/A;  . IABP INSERTION N/A 06/17/2019   Procedure: IABP Insertion;  Surgeon: Yolonda Kida, MD;  Location: [place] [facility];  Service: Cardiovascular;  Laterality: N/A;  . LEFT HEART CATH AND CORONARY ANGIOGRAPHY N/A 06/17/2019   Procedure: LEFT HEART CATH AND CORONARY ANGIOGRAPHY;  Surgeon: Yolonda Kida, MD;  Location: [place] [facility];  Service: Cardiovascular;  Laterality: N/A;  . RIGHT HEART CATH N/A 06/17/2019   Procedure: RIGHT HEART CATH;  Surgeon: Yolonda Kida, MD;  Location: [place] [facility];  Service: Cardiovascular;  Laterality: N/A;    Family History: Family History  Problem Relation Age of Onset  . CAD Father   . Valvular heart disease Father     Social History: Social History   Socioeconomic History  . Marital status: Married    Spouse name: Not on file  . Number of children: Not on file  . Years of education: Not on file  . Highest education level: Not on file  Occupational History  . Not on file  Tobacco Use  . Smoking status: Current Every Day Smoker    Packs/day: 1.50    Years: 30.00    Pack years: 45.00  . Smokeless tobacco: Never Used  Substance and Sexual Activity  . Alcohol use: Yes    Alcohol/week: 7.0 - 14.0 standard drinks    Types: 7 - 14 Standard drinks or equivalent per week  . Drug use: Never  . Sexual  activity: Not on file  Other Topics Concern  . Not on file  Social History Narrative  . Not on file   Social Determinants of Health   Financial Resource Strain:   . Difficulty of Paying Living Expenses:   Food Insecurity:   . Worried About Charity fundraiser in the Last Year:   . Arboriculturist in the Last Year:   Transportation Needs:   . Film/video editor (Medical):   Marland Kitchen Lack of Transportation (Non-Medical):   Physical Activity:   . Days of Exercise per Week:   . Minutes of Exercise per Session:   Stress:   . Feeling of Stress :   Social Connections:   . Frequency of Communication  with Friends and Family:   . Frequency of Social Gatherings with Friends and Family:   . Attends Religious Services:   . Active Member of Clubs or Organizations:   . Attends Archivist Meetings:   Marland Kitchen Marital Status:     Allergies:  Allergies  Allergen Reactions  . Codeine Hives    Objective:    Vital Signs:   Temp:  [97.8 F (36.6 C)-98.4 F (36.9 C)] 98.2 F (36.8 C) (06/17 1000) Pulse Rate:  [84-264] 184 (06/17 1000) Resp:  [10-28] 20 (06/17 1000) BP: (118-169)/(82-115) 138/92 (06/17 1000) SpO2:  [90 %-99 %] 96 % (06/17 1000) Arterial Line BP: (90-153)/(60-96) 107/71 (06/17 1000) Weight:  [95.5 kg-101.6 kg] 95.5 kg (06/17 0530) Last BM Date: 06/17/19  Weight change: Filed Weights   06/18/19 0045 06/18/19 0530  Weight: 101.6 kg 95.5 kg    Intake/Output:   Intake/Output Summary (Last 24 hours) at 06/18/2019 1052 Last data filed at 06/18/2019 1000 Gross per 24 hour  Intake 763.07 ml  Output 1575 ml  Net -811.93 ml      Physical Exam    General:  Well appearing, moderately obese. No resp difficulty HEENT: normal Neck: supple. JVP ~8 cm . Carotids 2+ bilat; no bruits. No lymphadenopathy or thyromegaly appreciated. Cor: PMI nondisplaced. Regular rate & rhythm. No rubs, gallops or murmurs. + Rt femoral IABP site ok Lungs: clear Abdomen: soft, nontender,  nondistended. No hepatosplenomegaly. No bruits or masses. Good bowel sounds. Extremities: no cyanosis, clubbing, rash, edema Neuro: alert & orientedx3, cranial nerves grossly intact. moves all 4 extremities w/o difficulty. Affect pleasant   Telemetry   NSR 80s   EKG    SR w/ anterolateral ST elevations, PVCs, 81 bpm   Labs   Basic Metabolic Panel: Recent Labs  Lab 06/17/19 2023 06/18/19 0119  NA 137 137  K 3.9 4.2  CL 105 103  CO2 20* 22  GLUCOSE 168* 159*  BUN 17 16  CREATININE 0.95 1.02  CALCIUM 8.6* 9.0    Liver Function Tests: Recent Labs  Lab 06/17/19 2023  AST 196*  ALT 161*  ALKPHOS 40  BILITOT 1.4*  PROT 6.3*  ALBUMIN 3.7   No results for input(s): LIPASE, AMYLASE in the last 168 hours. No results for input(s): AMMONIA in the last 168 hours.  CBC: Recent Labs  Lab 06/17/19 1958 06/18/19 0119 06/18/19 0424  WBC 12.5* 8.7 8.9  NEUTROABS 10.7*  --   --   HGB 16.6 16.8 16.5  HCT 47.4 49.7 49.1  MCV 96.1 98.4 99.8  PLT 172 157 155    Cardiac Enzymes: No results for input(s): CKTOTAL, CKMB, CKMBINDEX, TROPONINI in the last 168 hours.  BNP: BNP (last 3 results) Recent Labs    06/17/19 2023 06/18/19 0119  BNP 751.1* 1,068.5*    ProBNP (last 3 results) No results for input(s): PROBNP in the last 8760 hours.   CBG: No results for input(s): GLUCAP in the last 168 hours.  Coagulation Studies: Recent Labs    06/17/19 2023 06/18/19 0119  LABPROT 12.8 14.5  INR 1.0 1.2     Imaging   DG Chest 1 View  Result Date: 06/18/2019 CLINICAL DATA:  Chest pain EXAM: CHEST  1 VIEW COMPARISON:  06/17/2019 FINDINGS: Cardiac shadow is enlarged. Swan-Ganz catheter is noted extending into the left pulmonary artery. Intra-aortic balloon pump is noted with the tip just below the aortic knob. Lungs are well aerated without focal infiltrate. Mild central vascular congestion is noted  with minimal interstitial edema. IMPRESSION: Mild CHF. Tubes and lines  as described. Electronically Signed   By: Inez Catalina M.D.   On: 06/18/2019 10:31   CARDIAC CATHETERIZATION  Result Date: 06/17/2019  Mid LM lesion is 75% stenosed.  Ost LAD lesion is 100% stenosed.  Ost Cx to Prox Cx lesion is 100% stenosed.  Ost RCA lesion is 100% stenosed.  Hemodynamic findings consistent with severe pulmonary hypertension.  Intra-aortic balloon pump was placed to help with hemodynamic stability  Intervention was deferred unable to cross with a wire ostial LAD  Intervention was otherwise deferred in favor of bypass surgery  Conclusion Evidence of moderate to severe pulmonary hypertension Mean wedge of 43 mean PA of 53 Severe dilated cardiomyopathy probably ischemic ejection fraction less than 25% Moderate calcification of the proximal coronary arteries Multiple CTO's ostial LAD proximal circumflex ostial RCA Mild to moderate collaterals right to left to the LAD Minimal right to right collaterals Minimal left to left collaterals Coronaries 75% mid to distal left main 100% ostial LAD probable CTO 100% proximal circumflex probable CTO 100% ostial RCA probable CTO Collaterals as described Intervention was deferred after unable to cross with the wire for what was thought to be suspected CTO's of the ostial LAD Intra-aortic balloon pump was placed right femoral artery to help with hemodynamic stability 7 [place] Swan was placed on left and the PA position to help with hemodynamic monitoring Patient was placed on IV heparin for anticoagulation Arrangements were made for transfer to [facility] for possible ICU CCU care and possible evaluation for coronary bypass surgery Dr. Nechama Guard accepted the patient in transfer to CCU:   CT Angio Chest/Abd/Pel for Dissection W and/or Wo Contrast  Result Date: 06/17/2019 CLINICAL DATA:  Thoracic aortic disease, post repair. Chest pain, left arm pain. EXAM: CT ANGIOGRAPHY CHEST, ABDOMEN AND PELVIS TECHNIQUE: Non-contrast CT of the chest was  initially obtained. Multidetector CT imaging through the chest, abdomen and pelvis was performed using the standard protocol during bolus administration of intravenous contrast. Multiplanar reconstructed images and MIPs were obtained and reviewed to evaluate the vascular anatomy. CONTRAST:  162mL OMNIPAQUE IOHEXOL 350 MG/ML SOLN COMPARISON:  None. FINDINGS: CTA CHEST FINDINGS Cardiovascular: Mild cardiomegaly. Diffuse coronary artery calcifications. No visible aortic aneurysm or dissection. No evidence of prior repair. Maximum aortic diameter 3.6 cm in the ascending thoracic aorta. Mediastinum/Nodes: No mediastinal, hilar, or axillary adenopathy. Trachea and esophagus are unremarkable. Thyroid unremarkable. Lungs/Pleura: Lungs are clear. No focal airspace opacities or suspicious nodules. No effusions. Musculoskeletal: Chest wall soft tissues are unremarkable. No acute bony abnormality. Review of the MIP images confirms the above findings. CTA ABDOMEN AND PELVIS FINDINGS VASCULAR Aorta: Infrarenal aortic atherosclerosis. No aneurysm or dissection. Celiac: Patent without evidence of aneurysm, dissection, vasculitis or significant stenosis. SMA: Patent without evidence of aneurysm, dissection, vasculitis or significant stenosis. Renals: Both renal arteries are patent without evidence of aneurysm, dissection, vasculitis, fibromuscular dysplasia or significant stenosis. IMA: Patent without evidence of aneurysm, dissection, vasculitis or significant stenosis. Inflow: Patent without evidence of aneurysm, dissection, vasculitis or significant stenosis. Scattered calcifications. Veins: No obvious venous abnormality within the limitations of this arterial phase study. Review of the MIP images confirms the above findings. NON-VASCULAR Hepatobiliary: Suspect diffuse fatty infiltration of the liver. No focal hepatic abnormality. Gallbladder unremarkable. Pancreas: No focal abnormality or ductal dilatation. Spleen: No focal  abnormality.  Normal size. Adrenals/Urinary Tract: Low-density area noted in the midpole of the right kidney measuring 2.4 cm concerning for enhancing  lesion. Small cyst off the lower pole of the right kidney measures 11 mm. No hydronephrosis. Adrenal glands and urinary bladder are unremarkable. Stomach/Bowel: Normal appendix. Stomach, large and small bowel grossly unremarkable. Lymphatic: No adenopathy Reproductive: No visible focal abnormality. Other: No free fluid or free air. Musculoskeletal: No acute bony abnormality. Review of the MIP images confirms the above findings. IMPRESSION: No evidence of aortic aneurysm or dissection. No visible evidence of prior repair. Diffuse coronary artery calcifications.  Aortic atherosclerosis. No acute cardiopulmonary disease. Concern for 2.4 cm enhancing lesion within the midpole of the right kidney. Cannot exclude renal cell carcinoma. When the patient is clinically stable and able to follow directions and hold their breath (preferably as an outpatient) further evaluation with dedicated abdominal MRI should be considered. Electronically Signed   By: Rolm Baptise M.D.   On: 06/17/2019 20:29     Medications:     Current Medications: . aspirin  81 mg Oral Daily  . atorvastatin  80 mg Oral Daily  . Chlorhexidine Gluconate Cloth  6 each Topical Daily  . mouth rinse  15 mL Mouth Rinse BID  . sodium chloride flush  10-40 mL Intracatheter Q12H  . sodium chloride flush  3 mL Intravenous Q12H    Infusions: . sodium chloride 10 mL/hr at 06/18/19 1000  . sodium chloride Stopped (06/18/19 0953)  . sodium chloride 10 mL/hr at 06/18/19 1000  . heparin 1,200 Units/hr (06/18/19 1000)  . milrinone 0.25 mcg/kg/min (06/18/19 1000)  . nitroGLYCERIN 45 mcg/min (06/18/19 1000)     Assessment/Plan   1. Acute Anterolateral STEMI/ Multivessel CAD - Emergent cath showed severe multivessel disease w/ 75% mid- distal LM, 100% prox LAD, not amendable to PCI (unable to cross  w/ guide wire, probable CTO), 100% prox LCx (probable CTO) and 100% ostial RCA (probable CTO). There were mild to moderate collaterals right to left to the LAD, minimal right to right collaterals an minimal left to left collaterals. LVEF 25% - Hs trop peaked to 17,602. Trending down - Plan for CABG, tentatively scheduled for tomorrow. Currently CP free. - Continue IV heparin - Continue NTG gtt  - Continue ASA 81 mg - Continue Atorvastatin 80. Check FLP in am. LDL goal < 70   - no  blocker w/ shock   - would benefit from early initiation of a MRA (spironolactone)  - hold initiation of ARB/ ARNI until post CABG   2. Cardiogenic Shock - 2/2 acute MI. LVEF by LVG 25% - support w/ IABP until CABG, continue at 1:1 - CI marginal at 2.1. SVR elevated at 1607. - start milrinone 0.25 mcg/kg/min - follow Swan#s closely - co-ox pending   3. Systolic Heart Failure/ Ischemic CM - EF 25% by LVG. 2D Echo pending - 2/2 multivessel CAD. Plan for CABG - Volume status ok, CVP 8 - no  blocker w/ shock - eventual addition of an ARB/ARNi and spiro post CABG    4. Hypertension  - reported h/o untreated HTN prior to admit - BP elevated - on NTG gtt at 45 mcg/min - starting milrinone for cardiogenic shock, monitor pressure closely - If BP remains elevated, will try adding low dose spironolactone    5. Anxiety  - anxious post MI and awaiting CABG  - PRN versed and fentanyl  - trazodone at bedtime for sleep  6. Tobacco Abuse - 30 pack years  - smoking cessation advised   7. Elevated LFTs - AST 196 - ALT 161  - suspect shock  liver in setting of MI + CGS - continue MCS + inotropes - follow trend    Length of Stay: 1  Brittainy Simmons, PA-C  06/18/2019, 10:52 AM  Advanced Heart Failure Team Pager [phone] (M-F; 7a - 4p)  Please contact [facility] for night-coverage after hours (4p -7a ) and weekends on [email or web address]  Agree with above  54 y/o male smoker with HTN admitted to [facility]  with anterior stemi. Cath with severe 3v CAD. Unable to open LAD. Has total occlusion of pLCX and pRCA as well. EF 15% Now with IABP in place.   Luiz Blare numbers this am c/w cardiogenic shock and I started milrinone. Swan numbers now improved.   General:  Sitting up in bed . No resp difficulty HEENT: normal Neck: supple. no JVD. Carotids 2+ bilat; no bruits. No lymphadenopathy or thryomegaly appreciated. Cor: PMI nondisplaced. Regular rate & rhythm. 2/6 MR Lungs: clear Abdomen: soft, nontender, nondistended. No hepatosplenomegaly. No bruits or masses. Good bowel sounds. Extremities: no cyanosis, clubbing, rash, edema + RFA IABP Neuro: alert & orientedx3, cranial nerves grossly intact. moves all 4 extremities w/o difficulty. Affect pleasant  I reviewed cath films with Dr. Orvan Seen -> no targets for CABG or PCI. At this point he will almost certainly need advanced therapies due to lack of coronary circulation. Ideally would get transplant but has been actively smoking so would have to wait 6 months at a minimum. Suspect he will need VAD prior to discharge. Will try to optimize drips and oral meds and wean IABP first. VAD team to see in am.   R groin swan currently in RA on echo. I will likely move to IJ in am.  CRITICAL CARE Performed by: Glori Bickers  Total critical care time: 35 minutes  Critical care time was exclusive of separately billable procedures and treating other patients.  Critical care was necessary to treat or prevent imminent or life-threatening deterioration.  Critical care was time spent personally by me (independent of midlevel providers or residents) on the following activities: development of treatment plan with patient and/or surrogate as well as nursing, discussions with consultants, evaluation of patient's response to treatment, examination of patient, obtaining history from patient or surrogate, ordering and performing treatments and interventions, ordering and review  of laboratory studies, ordering and review of radiographic studies, pulse oximetry and re-evaluation of patient's condition.  Glori Bickers, MD  8:41 PM

## 2019-06-18 NOTE — Progress Notes (Signed)
White for Heparin  Indication: chest pain/ACS, s/p cath, multi-vessel CAD, awaiting CVTS evaluation for CABG, IABP in place  Allergies  Allergen Reactions  . Codeine Hives    Patient Measurements: Height: 5\' 9"  (175.3 cm) Weight: 101.6 kg (223 lb 15.8 oz) IBW/kg (Calculated) : 70.7  Vital Signs: Temp: 98.3 F (36.8 C) (06/17 0100) Temp Source: Oral (06/17 0100) BP: 127/88 (06/17 0500) Pulse Rate: 88 (06/17 0500)  Labs: Recent Labs    06/17/19 1958 06/17/19 1958 06/17/19 2023 06/18/19 0119 06/18/19 0424 06/18/19 0425  HGB 16.6   < >  --  16.8 16.5  --   HCT 47.4  --   --  49.7 49.1  --   PLT 172  --   --  157 155  --   APTT  --   --  25  --   --   --   LABPROT  --   --  12.8 14.5  --   --   INR  --   --  1.0 1.2  --   --   HEPARINUNFRC  --   --   --   --   --  0.43  CREATININE  --   --  0.95 1.02  --   --   TROPONINIHS  --   --  202* 17,602* 17,568*  --    < > = values in this interval not displayed.    Estimated Creatinine Clearance: 96.2 mL/min (by C-G formula based on SCr of 1.02 mg/dL).   Assessment: 54 y/o M transfer from [facility] for CABG. Pt presented to [facility] as CODE STEMI and underwent cath which showed multi-vessel CAD not amendable to PCI. IABP was placed in the cath lab. Pt was transferred with heparin running at 1200 units/hr.   6/17 AM update:  Heparin level is therapeutic   Goal of Therapy:  Heparin level 0.2-0.5 units/ml with IABP in place Monitor platelets by anticoagulation protocol: Yes   Plan:  Cont heparin at 1200 units/hr Confirmatory heparin level at 1200 Daily CBC/HL Monitor for bleeding F/U CVTS plans  Narda Bonds, PharmD, BCPS Clinical Pharmacist Phone: [phone]

## 2019-06-18 NOTE — Progress Notes (Signed)
°  Echocardiogram 2D Echocardiogram has been performed.  Aaron [name] 06/18/2019, 11:39 AM

## 2019-06-18 NOTE — Plan of Care (Signed)

## 2019-06-18 NOTE — Progress Notes (Signed)
ANTICOAGULATION CONSULT NOTE - Initial Consult  Pharmacy Consult for Heparin  Indication: chest pain/ACS, s/p cath, multi-vessel CAD, awaiting CVTS evaluation for CABG, IABP in place  Allergies  Allergen Reactions  . Codeine Hives    Patient Measurements: Height: 5\' 9"  (175.3 cm) Weight: 101.6 kg (223 lb 15.8 oz) IBW/kg (Calculated) : 70.7  Vital Signs: Temp: 98.3 F (36.8 C) (06/17 0100) Temp Source: Oral (06/17 0100) BP: 129/104 (06/17 0100) Pulse Rate: 98 (06/17 0100)  Labs: Recent Labs    06/17/19 1958 06/17/19 2023 06/18/19 0119  HGB 16.6  --  16.8  HCT 47.4  --  49.7  PLT 172  --  157  APTT  --  25  --   LABPROT  --  12.8  --   INR  --  1.0  --   CREATININE  --  0.95  --   TROPONINIHS  --  202*  --     Estimated Creatinine Clearance: 103.3 mL/min (by C-G formula based on SCr of 0.95 mg/dL).   Assessment: 54 y/o M transfer from [facility] for CABG. Pt presented to [facility] as CODE STEMI and underwent cath which showed multi-vessel CAD not amendable to PCI. IABP was placed in the cath lab. Pt was transferred with heparin running at 1200 units/hr.   Goal of Therapy:  Heparin level 0.2-0.5 units/ml with IABP in place Monitor platelets by anticoagulation protocol: Yes   Plan:  Cont heparin at 1200 units/hr 0500 heparin level Daily CBC/HL Monitor for bleeding F/U CVTS plans  Narda Bonds, PharmD, BCPS Clinical Pharmacist Phone: [phone]

## 2019-06-18 NOTE — H&P (Signed)
Cardiology Admission History and Physical:   Patient ID: Aaron [name]; MRN: [id number]; DOB: Mar 11, 1965   Admission date: 24-Jun-2019  Primary Care Provider:  Patient, No Pcp Per Primary Cardiologist:  No primary care provider on file.   Chief Complaint:  Chest pain  History of Present Illness:   Aaron [name] is a 54 y.o. male with a history of hypertension (not on any medications) who presented to the hospital with complaints of acute onset of chest pain yesterday (06-24-2019). The patient states that at around 5 pm he developed substernal, pressure-like chest pain, 9/10 in intensity. The pain went to his back. He also noted tingling sensation in his left arm. He felt dizzy and was very diaphoretic. He denied any shortness of breath. He did not have any nausea or vomiting. He has been experiencing worsening dyspnea on exertion for the past few weeks.   In the ED at [facility], the vital signs were: BP 129/87 mm Hg, HR 94 bpm. The electrocardiogram revealed normal sinus rhythm, rate 96 bpm, ST elevations in V1 - V3, aVR, ST depressions in inferolateral leads and Q waves in V1 - V3. For the diagnosis of a STEMI he was taken emergently to the cath lab at [facility]. 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 An attempt was made to open the LAD but the wire would not cross. An IABP and Swan-Ganz catheter were placed and the patient was then transferred to [facility] ICU for possible evaluation for CABG. EF on LV gram was 25%. LVEDP = 40 mmHg.  The labs were as follows: hs-Troponin 202, BNP 751, K 3.9, Gluc 168, Cr 0.95, AST 196, ALT 161, WBC 12.5, Hgb 16.6, Platelets 172. CTA chest/abdomen/pelvis did not reveal an aortic dissection.  Cardiac studies: Cardiac catheterization 06/24/2019 Conclusion Evidence of moderate to severe pulmonary hypertension Mean wedge of 43 mean PA of 53 Severe dilated cardiomyopathy probably ischemic ejection fraction less than 25% Moderate calcification of  the proximal coronary arteries Multiple CTO's ostial LAD proximal circumflex ostial RCA Mild to moderate collaterals right to left to the LAD Minimal right to right collaterals Minimal left to left collaterals Coronaries 75% mid to distal left main 100% ostial LAD probable CTO 100% proximal circumflex probable CTO 100% ostial RCA probable CTO Collaterals as described Intervention was deferred after unable to cross with the wire for what was thought to be suspected CTO's of the ostial LAD Intra-aortic balloon pump was placed right femoral artery to help with hemodynamic stability 7 [place] Swan was placed on left and the PA position to help with hemodynamic monitoring Patient was placed on IV heparin for anticoagulation   PAST MEDICAL HISTORY Hypertension Tobacco use   No past surgical history on file.   Medications Prior to Admission: Prior to Admission medications   Medication Sig Start Date End Date Taking? Authorizing Provider  heparin 25000-0.45 UT/250ML-% infusion Inject 1,200 Units/hr into the vein continuous. 2019-06-24   Callwood, Loran Senters, MD     Allergies:    Allergies  Allergen Reactions  . Codeine Hives    Social History:   Social History   Socioeconomic History  . Marital status: Married    Spouse name: Not on file  . Number of children: Not on file  . Years of education: Not on file  . Highest education level: Not on file  Occupational History  . Not on file  Tobacco Use  . Smoking status: Current Every Day Smoker    Packs/day: 1.50  Years: 30.00    Pack years: 45.00  . Smokeless tobacco: Never Used  Substance and Sexual Activity  . Alcohol use: Yes    Alcohol/week: 7.0 - 14.0 standard drinks    Types: 7 - 14 Standard drinks or equivalent per week  . Drug use: Not on file  . Sexual activity: Not on file  Other Topics Concern  . Not on file  Social History Narrative  . Not on file   Social Determinants of Health   Financial Resource Strain:    . Difficulty of Paying Living Expenses:   Food Insecurity:   . Worried About Charity fundraiser in the Last Year:   . Arboriculturist in the Last Year:   Transportation Needs:   . Film/video editor (Medical):   Marland Kitchen Lack of Transportation (Non-Medical):   Physical Activity:   . Days of Exercise per Week:   . Minutes of Exercise per Session:   Stress:   . Feeling of Stress :   Social Connections:   . Frequency of Communication with Friends and Family:   . Frequency of Social Gatherings with Friends and Family:   . Attends Religious Services:   . Active Member of Clubs or Organizations:   . Attends Archivist Meetings:   Marland Kitchen Marital Status:   Intimate Partner Violence:   . Fear of Current or Ex-Partner:   . Emotionally Abused:   Marland Kitchen Physically Abused:   . Sexually Abused:      Family History:   The patient's family history is not on file.     Review of Systems: [y] = yes, [ ]  = no   . General: Weight gain [ ] ; Weight loss [ ] ; Anorexia [ ] ; Fatigue [Y]; Fever [ ] ; Chills [ ] ; Weakness [ ]   . Cardiac: Chest pain/pressure [Y]; Resting SOB [ ] ; Exertional SOB [ ] ; Orthopnea [ ] ; Pedal Edema [ ] ; Palpitations [ ] ; Syncope [ ] ; Presyncope [ ] ; Paroxysmal nocturnal dyspnea[ ]   . Pulmonary: Cough [ ] ; Wheezing[ ] ; Hemoptysis[ ] ; Sputum [ ] ; Snoring [ ]   . GI: Vomiting[ ] ; Dysphagia[ ] ; Melena[ ] ; Hematochezia [ ] ; Heartburn[ ] ; Abdominal pain [ ] ; Constipation [ ] ; Diarrhea [ ] ; BRBPR [ ]   . GU: Hematuria[ ] ; Dysuria [ ] ; Nocturia[ ]   . Vascular: Pain in legs with walking [ ] ; Pain in feet with lying flat [ ] ; Non-healing sores [ ] ; Stroke [ ] ; TIA [ ] ; Slurred speech [ ] ;  . Neuro: Headaches[ ] ; Vertigo[ ] ; Seizures[ ] ; Paresthesias[ ] ;Blurred vision [ ] ; Diplopia [ ] ; Vision changes [ ]   . Ortho/Skin: Arthritis [ ] ; Joint pain [ ] ; Muscle pain [ ] ; Joint swelling [ ] ; Back Pain [ ] ; Rash [ ]   . Psych: Depression[ ] ; Anxiety[ ]   . Heme: Bleeding problems [ ] ; Clotting  disorders [ ] ; Anemia [ ]   . Endocrine: Diabetes [ ] ; Thyroid dysfunction[ ]      Physical Exam/Data:   Vitals:   06/17/19 2345 06/18/19 0000 06/18/19 0015 06/18/19 0030  BP: (!) 169/115     Pulse: 98 (!) 157 (!) 155 95  Resp: (!) 24 (!) 27 18 18   SpO2: 94% 93% 96% 96%   No intake or output data in the 24 hours ending 06/18/19 0039 There were no vitals filed for this visit. There is no height or weight on file to calculate BMI.  General:  I'll appearing HEENT: normal Lymph: no adenopathy Neck:  mildly elevated JVP Endocrine:  No thryomegaly Vascular: No carotid bruits; FA pulses 2+ bilaterally without bruits  Cardiac:  Soft systolic murmur, tachycardiac Lungs:  clear to auscultation bilaterally  Abd: soft, nontender, no hepatomegaly  Ext: trace edema, IABP in place Musculoskeletal:  No deformities Skin: warm and dry  Neuro:  CNs 2-12 intact, no focal abnormalities noted Psych:  Normal affect    Laboratory Data:  Chemistry Recent Labs  Lab 06/17/19 2023  NA 137  K 3.9  CL 105  CO2 20*  GLUCOSE 168*  BUN 17  CREATININE 0.95  CALCIUM 8.6*  GFRNONAA >60  GFRAA >60  ANIONGAP 12    Recent Labs  Lab 06/17/19 2023  PROT 6.3*  ALBUMIN 3.7  AST 196*  ALT 161*  ALKPHOS 40  BILITOT 1.4*   Hematology Recent Labs  Lab 06/17/19 1958  WBC 12.5*  RBC 4.93  HGB 16.6  HCT 47.4  MCV 96.1  MCH 33.7  MCHC 35.0  RDW 12.7  PLT 172   Cardiac EnzymesNo results for input(s): TROPONINI in the last 168 hours. No results for input(s): TROPIPOC in the last 168 hours.  BNP Recent Labs  Lab 06/17/19 2023  BNP 751.1*    DDimer No results for input(s): DDIMER in the last 168 hours.  Radiology/Studies:  CARDIAC CATHETERIZATION  Result Date: 06/17/2019  Mid LM lesion is 75% stenosed.  Ost LAD lesion is 100% stenosed.  Ost Cx to Prox Cx lesion is 100% stenosed.  Ost RCA lesion is 100% stenosed.  Hemodynamic findings consistent with severe pulmonary hypertension.   Intra-aortic balloon pump was placed to help with hemodynamic stability  Intervention was deferred unable to cross with a wire ostial LAD  Intervention was otherwise deferred in favor of bypass surgery  Conclusion Evidence of moderate to severe pulmonary hypertension Mean wedge of 43 mean PA of 53 Severe dilated cardiomyopathy probably ischemic ejection fraction less than 25% Moderate calcification of the proximal coronary arteries Multiple CTO's ostial LAD proximal circumflex ostial RCA Mild to moderate collaterals right to left to the LAD Minimal right to right collaterals Minimal left to left collaterals Coronaries 75% mid to distal left main 100% ostial LAD probable CTO 100% proximal circumflex probable CTO 100% ostial RCA probable CTO Collaterals as described Intervention was deferred after unable to cross with the wire for what was thought to be suspected CTO's of the ostial LAD Intra-aortic balloon pump was placed right femoral artery to help with hemodynamic stability 7 [place] Swan was placed on left and the PA position to help with hemodynamic monitoring Patient was placed on IV heparin for anticoagulation Arrangements were made for transfer to [facility] for possible ICU CCU care and possible evaluation for coronary bypass surgery Dr. Nechama Guard accepted the patient in transfer to CCU:   CT Angio Chest/Abd/Pel for Dissection W and/or Wo Contrast  Result Date: 06/17/2019 CLINICAL DATA:  Thoracic aortic disease, post repair. Chest pain, left arm pain. EXAM: CT ANGIOGRAPHY CHEST, ABDOMEN AND PELVIS TECHNIQUE: Non-contrast CT of the chest was initially obtained. Multidetector CT imaging through the chest, abdomen and pelvis was performed using the standard protocol during bolus administration of intravenous contrast. Multiplanar reconstructed images and MIPs were obtained and reviewed to evaluate the vascular anatomy. CONTRAST:  134mL OMNIPAQUE IOHEXOL 350 MG/ML SOLN COMPARISON:  None. FINDINGS: CTA  CHEST FINDINGS Cardiovascular: Mild cardiomegaly. Diffuse coronary artery calcifications. No visible aortic aneurysm or dissection. No evidence of prior repair. Maximum aortic diameter 3.6 cm in the  ascending thoracic aorta. Mediastinum/Nodes: No mediastinal, hilar, or axillary adenopathy. Trachea and esophagus are unremarkable. Thyroid unremarkable. Lungs/Pleura: Lungs are clear. No focal airspace opacities or suspicious nodules. No effusions. Musculoskeletal: Chest wall soft tissues are unremarkable. No acute bony abnormality. Review of the MIP images confirms the above findings. CTA ABDOMEN AND PELVIS FINDINGS VASCULAR Aorta: Infrarenal aortic atherosclerosis. No aneurysm or dissection. Celiac: Patent without evidence of aneurysm, dissection, vasculitis or significant stenosis. SMA: Patent without evidence of aneurysm, dissection, vasculitis or significant stenosis. Renals: Both renal arteries are patent without evidence of aneurysm, dissection, vasculitis, fibromuscular dysplasia or significant stenosis. IMA: Patent without evidence of aneurysm, dissection, vasculitis or significant stenosis. Inflow: Patent without evidence of aneurysm, dissection, vasculitis or significant stenosis. Scattered calcifications. Veins: No obvious venous abnormality within the limitations of this arterial phase study. Review of the MIP images confirms the above findings. NON-VASCULAR Hepatobiliary: Suspect diffuse fatty infiltration of the liver. No focal hepatic abnormality. Gallbladder unremarkable. Pancreas: No focal abnormality or ductal dilatation. Spleen: No focal abnormality.  Normal size. Adrenals/Urinary Tract: Low-density area noted in the midpole of the right kidney measuring 2.4 cm concerning for enhancing lesion. Small cyst off the lower pole of the right kidney measures 11 mm. No hydronephrosis. Adrenal glands and urinary bladder are unremarkable. Stomach/Bowel: Normal appendix. Stomach, large and small bowel grossly  unremarkable. Lymphatic: No adenopathy Reproductive: No visible focal abnormality. Other: No free fluid or free air. Musculoskeletal: No acute bony abnormality. Review of the MIP images confirms the above findings. IMPRESSION: No evidence of aortic aneurysm or dissection. No visible evidence of prior repair. Diffuse coronary artery calcifications.  Aortic atherosclerosis. No acute cardiopulmonary disease. Concern for 2.4 cm enhancing lesion within the midpole of the right kidney. Cannot exclude renal cell carcinoma. When the patient is clinically stable and able to follow directions and hold their breath (preferably as an outpatient) further evaluation with dedicated abdominal MRI should be considered. Electronically Signed   By: Rolm Baptise M.D.   On: 06/17/2019 20:29    Assessment and Plan:   1. ST Elevation myocardial infarction  The patient presents with acute onset of chest pain and ST elevations on ECG. He underwent an emergent cardiac catheterization procedure and was found to have multi-vessel CAD. An attempt was made at PCI of the LAD but was unsuccessful. His EF is 25% and the LV filling pressures were elevated during the cath (LVEDP 40). He has an IABP and Swan in place.  -IABP at 1:1 augmentation -Continue IV heparin -ASA 81 mg daily -High dose statins -Start IV nitroglycerin infusion for afterload reduction -Echocardiogram in AM -CT Surgery consult in AM -Keep the patient NPO   2. Systolic LV dysfunction  -Strict I and Os -Diuretics as needed -Monitor right heart pressures (PA 66/41 mmHg), CVP >10 currently, cardiac output is preserved   Severity of Illness: The appropriate patient status for this patient is INPATIENT. Inpatient status is judged to be reasonable and necessary in order to provide the required intensity of service to ensure the patient's safety. The patient's presenting symptoms, physical exam findings, and initial radiographic and laboratory data in the  context of their chronic comorbidities is felt to place them at high risk for further clinical deterioration. Furthermore, it is not anticipated that the patient will be medically stable for discharge from the hospital within 2 midnights of admission. The following factors support the patient status of inpatient.   " The patient's presenting symptoms include chest pain. " The worrisome physical exam  findings include tachycardia. " The initial radiographic and laboratory data are worrisome because of abnormal ECG. " The chronic co-morbidities include hypertension.   * I certify that at the point of admission it is my clinical judgment that the patient will require inpatient hospital care spanning beyond 2 midnights from the point of admission due to high intensity of service, high risk for further deterioration and high frequency of surveillance required.*    For questions or updates, please contact [place] Please consult [email or web address] for contact info under Cardiology/STEMI.    Signed, Meade Maw, MD  06/18/2019 12:39 AM

## 2019-06-18 NOTE — Progress Notes (Signed)
CRITICAL VALUE ALERT  Critical Value:  EKG reading ACUTE MI/STEMI  Date & Time Notied:  06/18/19 7:00 AM   Provider Notified: Dr. Kathlen Mody, PA- Cardiology  Orders Received/Actions taken: No new orders. Team to round shortly.

## 2019-06-18 NOTE — Progress Notes (Signed)
SWAN repositioned at bedside by Dr. Orvan Seen. Swan at [facility]

## 2019-06-18 NOTE — Progress Notes (Signed)
[facility] for Heparin  Indication: chest pain/ACS, multi-vessel CAD, awaiting CVTS evaluation for CABG, IABP in place  Allergies  Allergen Reactions  . Codeine Hives    Patient Measurements: Height: 5\' 9"  (175.3 cm) Weight: 95.5 kg (210 lb 8.6 oz) IBW/kg (Calculated) : 70.7  Vital Signs: Temp: 99.1 F (37.3 C) (06/17 1800) Temp Source: Oral (06/17 0800) BP: 118/65 (06/17 1800) Pulse Rate: 206 (06/17 1800)  Labs: Recent Labs    06/17/19 1958 06/17/19 1958 06/17/19 2023 06/18/19 0119 06/18/19 0424 06/18/19 0425 06/18/19 0911 06/18/19 1228  HGB 16.6   < >  --  16.8 16.5  --   --   --   HCT 47.4  --   --  49.7 49.1  --   --   --   PLT 172  --   --  157 155  --   --   --   APTT  --   --  25  --   --   --   --   --   LABPROT  --   --  12.8 14.5  --   --   --   --   INR  --   --  1.0 1.2  --   --   --   --   HEPARINUNFRC  --   --   --   --   --  0.43  --  0.35  CREATININE  --   --  0.95 1.02  --   --   --  1.16  TROPONINIHS  --    < > 202* 17,602* 17,568*  --  >27,000*  --    < > = values in this interval not displayed.    Estimated Creatinine Clearance: 82 mL/min (by C-G formula based on SCr of 1.16 mg/dL).   Assessment: 54 y/o M transfer from [facility] for CABG. Pt presented to [facility] as CODE STEMI and underwent cath which showed multi-vessel CAD not amendable to PCI. IABP was placed in the cath lab.   Confirmatory heparin level therapeutic; no bleeding reported.   Goal of Therapy:  Heparin level 0.2-0.5 units/ml with IABP in place Monitor platelets by anticoagulation protocol: Yes   Plan:  Continue heparin gtt at 1200 units/hr CABG in AM  Rylie Limburg D. Mina Marble, PharmD, BCPS, [place] 06/18/2019, 6:25 PM

## 2019-06-19 ENCOUNTER — Inpatient Hospital Stay: Payer: Self-pay

## 2019-06-19 LAB — COOXEMETRY PANEL
Carboxyhemoglobin: 1.8 % — ABNORMAL HIGH (ref 0.5–1.5)
Carboxyhemoglobin: 1.8 % — ABNORMAL HIGH (ref 0.5–1.5)
Methemoglobin: 1 % (ref 0.0–1.5)
Methemoglobin: 1.1 % (ref 0.0–1.5)
O2 Saturation: 63.9 %
O2 Saturation: 69.7 %
Total hemoglobin: 13.8 g/dL (ref 12.0–16.0)
Total hemoglobin: 14 g/dL (ref 12.0–16.0)

## 2019-06-19 LAB — HEPARIN LEVEL (UNFRACTIONATED): Heparin Unfractionated: 0.18 IU/mL — ABNORMAL LOW (ref 0.30–0.70)

## 2019-06-19 LAB — LIPID PANEL
Cholesterol: 196 mg/dL (ref 0–200)
HDL: 59 mg/dL (ref >40)
LDL Cholesterol: 123 mg/dL — ABNORMAL HIGH (ref 0–99)
Total CHOL/HDL Ratio: 3.3 RATIO
Triglycerides: 72 mg/dL (ref <150)
VLDL: 14 mg/dL (ref 0–40)

## 2019-06-19 LAB — CBC
HCT: 46.2 % (ref 39.0–52.0)
Hemoglobin: 15.3 g/dL (ref 13.0–17.0)
MCH: 33.1 pg (ref 26.0–34.0)
MCHC: 33.1 g/dL (ref 30.0–36.0)
MCV: 100 fL (ref 80.0–100.0)
Platelets: 105 10*3/uL — ABNORMAL LOW (ref 150–400)
RBC: 4.62 MIL/uL (ref 4.22–5.81)
RDW: 12.8 % (ref 11.5–15.5)
WBC: 10.1 10*3/uL (ref 4.0–10.5)
nRBC: 0 % (ref 0.0–0.2)

## 2019-06-19 LAB — BASIC METABOLIC PANEL
Anion gap: 10 (ref 5–15)
BUN: 12 mg/dL (ref 6–20)
CO2: 26 mmol/L (ref 22–32)
Calcium: 8.7 mg/dL — ABNORMAL LOW (ref 8.9–10.3)
Chloride: 99 mmol/L (ref 98–111)
Creatinine, Ser: 1.08 mg/dL (ref 0.61–1.24)
GFR calc Af Amer: >60 mL/min (ref >60)
GFR calc non Af Amer: >60 mL/min (ref >60)
Glucose, Bld: 124 mg/dL — ABNORMAL HIGH (ref 70–99)
Potassium: 3.6 mmol/L (ref 3.5–5.1)
Sodium: 135 mmol/L (ref 135–145)

## 2019-06-19 LAB — POCT ACTIVATED CLOTTING TIME: Activated Clotting Time: 125 seconds

## 2019-06-19 LAB — ECHOCARDIOGRAM COMPLETE
Height: 69 in
Weight: 3368.63 oz

## 2019-06-19 MED ORDER — POTASSIUM CHLORIDE CRYS ER 20 MEQ PO TBCR
40.0000 meq | EXTENDED_RELEASE_TABLET | Freq: Once | ORAL | Status: AC
  Administered 2019-06-19: 40 meq via ORAL
  Filled 2019-06-19: qty 2

## 2019-06-19 MED ORDER — SODIUM CHLORIDE 0.9% FLUSH
10.0000 mL | Freq: Two times a day (BID) | INTRAVENOUS | Status: DC
  Administered 2019-06-20 – 2019-06-22 (×4): 10 mL
  Administered 2019-06-23: 30 mL
  Administered 2019-06-24 – 2019-06-27 (×4): 10 mL

## 2019-06-19 MED ORDER — ADULT MULTIVITAMIN W/MINERALS CH
1.0000 | ORAL_TABLET | Freq: Every day | ORAL | Status: DC
  Administered 2019-06-19 – 2019-07-10 (×19): 1 via ORAL
  Filled 2019-06-19 (×21): qty 1

## 2019-06-19 MED ORDER — SODIUM CHLORIDE 0.9% FLUSH: 10.0000 mL | INTRAVENOUS | Status: DC | PRN

## 2019-06-19 MED ORDER — MIDAZOLAM HCL 2 MG/2ML IJ SOLN: 1.0000 mg | INTRAMUSCULAR | Status: DC | PRN

## 2019-06-19 MED ORDER — SPIRONOLACTONE 25 MG PO TABS
25.0000 mg | ORAL_TABLET | Freq: Every day | ORAL | Status: DC
  Administered 2019-06-19 – 2019-06-24 (×6): 25 mg via ORAL
  Filled 2019-06-19 (×6): qty 1

## 2019-06-19 MED ORDER — THIAMINE HCL 100 MG PO TABS
100.0000 mg | ORAL_TABLET | Freq: Every day | ORAL | Status: DC
  Administered 2019-06-19 – 2019-07-10 (×19): 100 mg via ORAL
  Filled 2019-06-19 (×23): qty 1

## 2019-06-19 MED ORDER — ASPIRIN 81 MG PO CHEW
CHEWABLE_TABLET | ORAL | Status: AC
  Filled 2019-06-19: qty 1

## 2019-06-19 MED ORDER — ALPRAZOLAM 0.25 MG PO TABS
0.2500 mg | ORAL_TABLET | Freq: Three times a day (TID) | ORAL | Status: DC | PRN
  Administered 2019-06-19 – 2019-07-08 (×16): 0.25 mg via ORAL
  Filled 2019-06-19 (×16): qty 1

## 2019-06-19 MED ORDER — FOLIC ACID 1 MG PO TABS
1.0000 mg | ORAL_TABLET | Freq: Every day | ORAL | Status: DC
  Administered 2019-06-19 – 2019-07-10 (×19): 1 mg via ORAL
  Filled 2019-06-19 (×20): qty 1

## 2019-06-19 NOTE — Progress Notes (Signed)
IABP 1:3 per Dr. Haroldine Laws. RN to check COOX  in 1 hr, if greater than 58-60, turn off heparin in preparation for removal of IABP. RN will continue to monitor.   Pt also to get PICC line. Once PICC line in, RN to remove SWAN in right fem.

## 2019-06-19 NOTE — Progress Notes (Signed)
Progress Note  Patient Name: Aaron [name] Date of Encounter: 06/19/2019  [facility] Cardiologist: No primary care provider on file.   Subjective   Understandably shocked by situation.  Breathing improved.  Minimal chest pain.  Inpatient Medications    Scheduled Meds: . aspirin  81 mg Oral Daily  . atorvastatin  80 mg Oral Daily  . Chlorhexidine Gluconate Cloth  6 each Topical Daily  . digoxin  0.125 mg Oral Daily  . folic acid  1 mg Oral Daily  . furosemide  40 mg Intravenous BID  . mouth rinse  15 mL Mouth Rinse BID  . multivitamin with minerals  1 tablet Oral Daily  . sacubitril-valsartan  1 tablet Oral BID  . sodium chloride flush  10-40 mL Intracatheter Q12H  . sodium chloride flush  3 mL Intravenous Q12H  . spironolactone  25 mg Oral Daily  . thiamine  100 mg Oral Daily   Continuous Infusions: . sodium chloride 10 mL/hr at 06/19/19 0900  . sodium chloride Stopped (06/18/19 0953)  . sodium chloride 10 mL/hr at 06/19/19 0900  . heparin 1,200 Units/hr (06/19/19 0900)  . milrinone 0.25 mcg/kg/min (06/19/19 0900)  . nitroGLYCERIN Stopped (06/19/19 0840)   PRN Meds: sodium chloride, sodium chloride, sodium chloride, acetaminophen, ALPRAZolam, fentaNYL (SUBLIMAZE) injection, morphine injection, nitroGLYCERIN, ondansetron (ZOFRAN) IV, [START ON 06/22/2019] pneumococcal 23 valent vaccine, sodium chloride flush, sodium chloride flush, traZODone   Vital Signs    Vitals:   06/19/19 0700 06/19/19 0800 06/19/19 0819 06/19/19 0900  BP: 117/81 129/62 103/71 110/64  Pulse: 94 96  (!) 131  Resp: 18 15  17   Temp: 99.5 F (37.5 C) 100 F (37.8 C)  100 F (37.8 C)  TempSrc:  Core    SpO2: 94% 97%  99%  Weight:      Height:        Intake/Output Summary (Last 24 hours) at 06/19/2019 0935 Last data filed at 06/19/2019 0900 Gross per 24 hour  Intake 2300.85 ml  Output 3700 ml  Net -1399.15 ml   Last 3 Weights 06/19/2019 06/18/2019 06/18/2019  Weight (lbs) 209 lb 3.5 oz  210 lb 8.6 oz 223 lb 15.8 oz  Weight (kg) 94.9 kg 95.5 kg 101.6 kg      Telemetry    Sinus rhythm with occasional ectopy- Personally Reviewed  ECG    Sinus rhythm, persistent ST elevation anterior leads- Personally Reviewed  Physical Exam   GEN: No acute distress.   Neck: No JVD Cardiac: RRR, no murmurs, rubs, or gallops.  Ectopy, balloon pump noise Respiratory: Clear to auscultation bilaterally. GI: Soft, nontender, non-distended  MS: No edema; No deformity. Neuro:  Nonfocal  Psych: Overwhelmed  Labs    High Sensitivity Troponin:   Recent Labs  Lab 06/17/19 2023 06/18/19 0119 06/18/19 0424 06/18/19 0911  TROPONINIHS 202* 17,602* 17,568* >27,000*      Chemistry Recent Labs  Lab 06/17/19 2023 06/18/19 0119 06/18/19 1228 06/18/19 2013 06/19/19 0402  NA 137   < > 139 136 135  K 3.9   < > 4.0 3.5 3.6  CL 105   < > 103 101 99  CO2 20*   < > 26 25 26   GLUCOSE 168*   < > 158* 163* 124*  BUN 17   < > 15 12 12   CREATININE 0.95   < > 1.16 0.86 1.08  CALCIUM 8.6*   < > 8.9 8.8* 8.7*  PROT 6.3*  --   --   --   --  ALBUMIN 3.7  --   --   --   --   AST 196*  --   --   --   --   ALT 161*  --   --   --   --   ALKPHOS 40  --   --   --   --   BILITOT 1.4*  --   --   --   --   GFRNONAA >60   < > >60 >60 >60  GFRAA >60   < > >60 >60 >60  ANIONGAP 12   < > 10 10 10    < > = values in this interval not displayed.     Hematology Recent Labs  Lab 06/18/19 0119 06/18/19 0424 06/19/19 0402  WBC 8.7 8.9 10.1  RBC 5.05 4.92 4.62  HGB 16.8 16.5 15.3  HCT 49.7 49.1 46.2  MCV 98.4 99.8 100.0  MCH 33.3 33.5 33.1  MCHC 33.8 33.6 33.1  RDW 12.7 12.8 12.8  PLT 157 155 105*    BNP Recent Labs  Lab 06/17/19 2023 06/18/19 0119  BNP 751.1* 1,068.5*     DDimer No results for input(s): DDIMER in the last 168 hours.   Radiology    DG Chest 1 View  Result Date: 06/18/2019 CLINICAL DATA:  Chest pain EXAM: CHEST  1 VIEW COMPARISON:  06/17/2019 FINDINGS: Cardiac shadow  is enlarged. Swan-Ganz catheter is noted extending into the left pulmonary artery. Intra-aortic balloon pump is noted with the tip just below the aortic knob. Lungs are well aerated without focal infiltrate. Mild central vascular congestion is noted with minimal interstitial edema. IMPRESSION: Mild CHF. Tubes and lines as described. Electronically Signed   By: Inez Catalina M.D.   On: 06/18/2019 10:31   CARDIAC CATHETERIZATION  Result Date: 06/17/2019  Mid LM lesion is 75% stenosed.  Ost LAD lesion is 100% stenosed.  Ost Cx to Prox Cx lesion is 100% stenosed.  Ost RCA lesion is 100% stenosed.  Hemodynamic findings consistent with severe pulmonary hypertension.  Intra-aortic balloon pump was placed to help with hemodynamic stability  Intervention was deferred unable to cross with a wire ostial LAD  Intervention was otherwise deferred in favor of bypass surgery  Conclusion Evidence of moderate to severe pulmonary hypertension Mean wedge of 43 mean PA of 53 Severe dilated cardiomyopathy probably ischemic ejection fraction less than 25% Moderate calcification of the proximal coronary arteries Multiple CTO's ostial LAD proximal circumflex ostial RCA Mild to moderate collaterals right to left to the LAD Minimal right to right collaterals Minimal left to left collaterals Coronaries 75% mid to distal left main 100% ostial LAD probable CTO 100% proximal circumflex probable CTO 100% ostial RCA probable CTO Collaterals as described Intervention was deferred after unable to cross with the wire for what was thought to be suspected CTO's of the ostial LAD Intra-aortic balloon pump was placed right femoral artery to help with hemodynamic stability 7 [place] Swan was placed on left and the PA position to help with hemodynamic monitoring Patient was placed on IV heparin for anticoagulation Arrangements were made for transfer to [facility] for possible ICU CCU care and possible evaluation for coronary bypass  surgery Dr. Nechama Guard accepted the patient in transfer to CCU:   DG CHEST PORT 1 VIEW  Result Date: 06/18/2019 CLINICAL DATA:  Central line care, Swan-Ganz catheter EXAM: PORTABLE CHEST 1 VIEW COMPARISON:  06/18/2019 FINDINGS: Swan-Ganz catheter enters the heart from the IVC, coils with the tip in the  right atrium. Cardiomegaly. Mild vascular congestion. No confluent opacities or effusions. IMPRESSION: Swan-Ganz catheter coils in the heart with the tip in the right atrium. Cardiomegaly, vascular congestion. Electronically Signed   By: Rolm Baptise M.D.   On: 06/18/2019 20:23   ECHOCARDIOGRAM COMPLETE  Result Date: 06/19/2019    ECHOCARDIOGRAM REPORT   Patient Name:   Aaron [name] Date of Exam: 06/18/2019 Medical Rec #:  [id number]       Height:       69.0 in Accession #:    [id number]      Weight:       210.5 lb Date of Birth:  [birth year]       BSA:          2.112 m Patient Age:    54 years        BP:           138/92 mmHg Patient Gender: M               HR:           87 bpm. Exam Location:  Inpatient Procedure: 2D Echo, Cardiac Doppler and Color Doppler Indications:     Chest pain  History:         Patient has no prior history of Echocardiogram examinations.                  CAD; Risk Factors:Current Smoker and Hypertension. Ischemic                  cardiomyopathy.  Sonographer:     Clayton Lefort RDCS (AE) Referring Phys:  [id number] Lacie Scotts [facility] Diagnosing Phys: Jenkins Rouge MD IMPRESSIONS  1. Findings consistent with multi vessel CAD. Septal and apical akinesis mid and apical inferior and anterior wall hypokinesis . Left ventricular ejection fraction, by estimation, is 20 to 25%. The left ventricle has severely decreased function. The left ventricle demonstrates regional wall motion abnormalities (see scoring diagram/findings for description). The left ventricular internal cavity size was severely dilated. There is moderate left ventricular hypertrophy. Left ventricular diastolic parameters are  indeterminate.  2. Right ventricular systolic function is normal. The right ventricular size is normal.  3. Left atrial size was severely dilated.  4. The mitral valve is normal in structure. Mild mitral valve regurgitation. No evidence of mitral stenosis.  5. The aortic valve is tricuspid. Aortic valve regurgitation is not visualized. No aortic stenosis is present.  6. Dilated sinus and root Root measures 4.2 cm . Aortic dilatation noted. There is mild dilatation at the level of the sinuses of Valsalva measuring 41 mm.  7. The inferior vena cava is normal in size with greater than 50% respiratory variability, suggesting right atrial pressure of 3 mmHg. FINDINGS  Left Ventricle: Findings consistent with multi vessel CAD. Septal and apical akinesis mid and apical inferior and anterior wall hypokinesis. Left ventricular ejection fraction, by estimation, is 20 to 25%. The left ventricle has severely decreased function. The left ventricle demonstrates regional wall motion abnormalities. The left ventricular internal cavity size was severely dilated. There is moderate left ventricular hypertrophy. Left ventricular diastolic parameters are indeterminate. Right Ventricle: The right ventricular size is normal. No increase in right ventricular wall thickness. Right ventricular systolic function is normal. Left Atrium: Left atrial size was severely dilated. Right Atrium: Right atrial size was normal in size. Pericardium: There is no evidence of pericardial effusion. Mitral Valve: The mitral valve is normal in structure. There is mild thickening  of the mitral valve leaflet(s). Normal mobility of the mitral valve leaflets. Mild mitral annular calcification. Mild mitral valve regurgitation. No evidence of mitral valve stenosis. Tricuspid Valve: The tricuspid valve is normal in structure. Tricuspid valve regurgitation is not demonstrated. No evidence of tricuspid stenosis. Aortic Valve: The aortic valve is tricuspid. Aortic valve  regurgitation is not visualized. No aortic stenosis is present. Aortic valve mean gradient measures 3.0 mmHg. Aortic valve peak gradient measures 5.0 mmHg. Aortic valve area, by VTI measures 3.32 cm. Pulmonic Valve: The pulmonic valve was normal in structure. Pulmonic valve regurgitation is not visualized. No evidence of pulmonic stenosis. Aorta: Dilated sinus and root Root measures 4.2 cm. The aortic root is normal in size and structure and aortic dilatation noted. There is mild dilatation at the level of the sinuses of Valsalva measuring 41 mm. Venous: The inferior vena cava is normal in size with greater than 50% respiratory variability, suggesting right atrial pressure of 3 mmHg. IAS/Shunts: No atrial level shunt detected by color flow Doppler.  LEFT VENTRICLE PLAX 2D LVIDd:         5.10 cm      Diastology LVIDs:         4.10 cm      LV e' lateral: 4.78 cm/s LV PW:         1.30 cm      LV e' medial:  2.27 cm/s LV IVS:        1.40 cm LVOT diam:     2.40 cm LV SV:         56 LV SV Index:   27 LVOT Area:     4.52 cm     3D Volume EF:                             3D EF:        32 %                             LV EDV:       304 ml LV Volumes (MOD)            LV ESV:       206 ml LV vol d, MOD A2C: 141.0 ml LV SV:        97 ml LV vol d, MOD A4C: 171.0 ml LV vol s, MOD A2C: 111.0 ml LV vol s, MOD A4C: 131.0 ml LV SV MOD A2C:     30.0 ml LV SV MOD A4C:     171.0 ml LV SV MOD BP:      32.7 ml RIGHT VENTRICLE            IVC RV Basal diam:  4.00 cm    IVC diam: 1.70 cm RV Mid diam:    3.20 cm RV S prime:     8.92 cm/s TAPSE (M-mode): 1.4 cm LEFT ATRIUM              Index       RIGHT ATRIUM           Index LA diam:        5.40 cm  2.56 cm/m  RA Area:     20.60 cm LA Vol (A2C):   117.0 ml 55.41 ml/m RA Volume:   62.20 ml  29.46 ml/m LA Vol (A4C):   131.0 ml 62.04 ml/m LA Biplane Vol:  123.0 ml 58.25 ml/m  AORTIC VALVE AV Area (Vmax):    3.40 cm AV Area (Vmean):   3.57 cm AV Area (VTI):     3.32 cm AV Vmax:            112.00 cm/s AV Vmean:          80.000 cm/s AV VTI:            0.169 m AV Peak Grad:      5.0 mmHg AV Mean Grad:      3.0 mmHg LVOT Vmax:         84.20 cm/s LVOT Vmean:        63.100 cm/s LVOT VTI:          0.124 m LVOT/AV VTI ratio: 0.73  AORTA Ao Root diam: 4.10 cm Ao Asc diam:  4.20 cm  SHUNTS Systemic VTI:  0.12 m Systemic Diam: 2.40 cm Jenkins Rouge MD Electronically signed by Jenkins Rouge MD Signature Date/Time: 06/18/2019/5:23:11 PM    Final (Updated)    CT Angio Chest/Abd/Pel for Dissection W and/or Wo Contrast  Result Date: 06/17/2019 CLINICAL DATA:  Thoracic aortic disease, post repair. Chest pain, left arm pain. EXAM: CT ANGIOGRAPHY CHEST, ABDOMEN AND PELVIS TECHNIQUE: Non-contrast CT of the chest was initially obtained. Multidetector CT imaging through the chest, abdomen and pelvis was performed using the standard protocol during bolus administration of intravenous contrast. Multiplanar reconstructed images and MIPs were obtained and reviewed to evaluate the vascular anatomy. CONTRAST:  151mL OMNIPAQUE IOHEXOL 350 MG/ML SOLN COMPARISON:  None. FINDINGS: CTA CHEST FINDINGS Cardiovascular: Mild cardiomegaly. Diffuse coronary artery calcifications. No visible aortic aneurysm or dissection. No evidence of prior repair. Maximum aortic diameter 3.6 cm in the ascending thoracic aorta. Mediastinum/Nodes: No mediastinal, hilar, or axillary adenopathy. Trachea and esophagus are unremarkable. Thyroid unremarkable. Lungs/Pleura: Lungs are clear. No focal airspace opacities or suspicious nodules. No effusions. Musculoskeletal: Chest wall soft tissues are unremarkable. No acute bony abnormality. Review of the MIP images confirms the above findings. CTA ABDOMEN AND PELVIS FINDINGS VASCULAR Aorta: Infrarenal aortic atherosclerosis. No aneurysm or dissection. Celiac: Patent without evidence of aneurysm, dissection, vasculitis or significant stenosis. SMA: Patent without evidence of aneurysm, dissection, vasculitis or  significant stenosis. Renals: Both renal arteries are patent without evidence of aneurysm, dissection, vasculitis, fibromuscular dysplasia or significant stenosis. IMA: Patent without evidence of aneurysm, dissection, vasculitis or significant stenosis. Inflow: Patent without evidence of aneurysm, dissection, vasculitis or significant stenosis. Scattered calcifications. Veins: No obvious venous abnormality within the limitations of this arterial phase study. Review of the MIP images confirms the above findings. NON-VASCULAR Hepatobiliary: Suspect diffuse fatty infiltration of the liver. No focal hepatic abnormality. Gallbladder unremarkable. Pancreas: No focal abnormality or ductal dilatation. Spleen: No focal abnormality.  Normal size. Adrenals/Urinary Tract: Low-density area noted in the midpole of the right kidney measuring 2.4 cm concerning for enhancing lesion. Small cyst off the lower pole of the right kidney measures 11 mm. No hydronephrosis. Adrenal glands and urinary bladder are unremarkable. Stomach/Bowel: Normal appendix. Stomach, large and small bowel grossly unremarkable. Lymphatic: No adenopathy Reproductive: No visible focal abnormality. Other: No free fluid or free air. Musculoskeletal: No acute bony abnormality. Review of the MIP images confirms the above findings. IMPRESSION: No evidence of aortic aneurysm or dissection. No visible evidence of prior repair. Diffuse coronary artery calcifications.  Aortic atherosclerosis. No acute cardiopulmonary disease. Concern for 2.4 cm enhancing lesion within the midpole of the right kidney. Cannot exclude renal cell carcinoma. When the  patient is clinically stable and able to follow directions and hold their breath (preferably as an outpatient) further evaluation with dedicated abdominal MRI should be considered. Electronically Signed   By: Rolm Baptise M.D.   On: 06/17/2019 20:29   VAS US DOPPLER PRE CABG  Result Date: 06/18/2019 PREOPERATIVE VASCULAR  EVALUATION  Indications:  Pre-CABG. Risk Factors: Hypertension, coronary artery disease. Limitations:  Pt on balloon pump Performing Technologist: Abram Sander RVS  Examination Guidelines: A complete evaluation includes B-mode imaging, spectral Doppler, color Doppler, and power Doppler as needed of all accessible portions of each vessel. Bilateral testing is considered an integral part of a complete examination. Limited examinations for reoccurring indications may be performed as noted.  Right Carotid Findings: +----------+--------+--------+--------+------------+--------+           PSV cm/sEDV cm/sStenosisDescribe    Comments +----------+--------+--------+--------+------------+--------+ CCA Prox                          heterogenouspatent   +----------+--------+--------+--------+------------+--------+ CCA Distal                        heterogenouspatent   +----------+--------+--------+--------+------------+--------+ ICA Prox                          heterogenouspatent   +----------+--------+--------+--------+------------+--------+ ICA Distal                                    patent   +----------+--------+--------+--------+------------+--------+ ECA                                           patent   +----------+--------+--------+--------+------------+--------+ +----------+--------+-------+--------+------------+           PSV cm/sEDV cmsDescribeArm Pressure +----------+--------+-------+--------+------------+ Subclavian               patent               +----------+--------+-------+--------+------------+ +---------+--------+--------+------+ VertebralPSV cm/sEDV cm/spatent +---------+--------+--------+------+ Left Carotid Findings: +----------+--------+--------+--------+------------+--------+           PSV cm/sEDV cm/sStenosisDescribe    Comments +----------+--------+--------+--------+------------+--------+ CCA Prox                           heterogenouspatent   +----------+--------+--------+--------+------------+--------+ CCA Distal                        heterogenouspatent   +----------+--------+--------+--------+------------+--------+ ICA Prox                          heterogenouspatent   +----------+--------+--------+--------+------------+--------+ ICA Distal                                    patent   +----------+--------+--------+--------+------------+--------+ ECA                                           patent   +----------+--------+--------+--------+------------+--------+ +----------+--------+--------+--------+------------+ SubclavianPSV cm/sEDV cm/sDescribeArm Pressure +----------+--------+--------+--------+------------+  patent               +----------+--------+--------+--------+------------+ +---------+--------+--------+------+ VertebralPSV cm/sEDV cm/spatent +---------+--------+--------+------+  Summary: Right Carotid: Patient on balloon pump. Left Carotid: Patient on balloon pump. Right ABI: Patient on balloon pump. Left ABI: Patient on balloon pump. Right Upper Extremity: Doppler waveforms remain within normal limits with right radial compression. Doppler waveform obliterate with right ulnar compression. Left Upper Extremity: Doppler waveforms decrease 50% with left radial compression. Doppler waveforms remain within normal limits with left ulnar compression.  Electronically signed by Monica Martinez MD on 06/18/2019 at 3:23:41 PM.    Final    [place] EKG SITE RITE  Result Date: 06/19/2019 If Site Rite image not attached, placement could not be confirmed due to current cardiac rhythm.   Cardiac Studies   Severe three-vessel coronary artery disease with no targets, EF 25%  Patient Profile     54 y.o. male with acute systolic heart failure, cardiogenic shock, acute anterior myocardial infarction with severe three-vessel disease-non-CABG candidate, poor  targets  Assessment & Plan    Cardiogenic shock/acute systolic heart failure ischemic cardiomyopathy/severe CAD -Appreciate cardiothoracic team, advanced heart failure team, VAD team -Cardiac catheterization demonstrates no viable targets for CABG -Continuing with aggressive support, weaning balloon pump currently, milrinone, Lasix PICC line placement.  Coox 64, creatinine 1.08, LDL 123 -Understandably, patient is overwhelmed at this time. -Started Entresto, spironolactone. -Smoking cessation.    For questions or updates, please contact [place] Please consult [email or web address] for contact info under        Signed, Candee Furbish, MD  06/19/2019, 9:35 AM

## 2019-06-19 NOTE — Progress Notes (Addendum)
Coox results on IABP 1:3 ratio = 69%. Heparin gtt turned off at this time.

## 2019-06-19 NOTE — Progress Notes (Signed)
Peripherally Inserted Central Catheter Placement  The IV Nurse has discussed with the patient and/or persons authorized to consent for the patient, the purpose of this procedure and the potential benefits and risks involved with this procedure.  The benefits include less needle sticks, lab draws from the catheter, and the patient may be discharged home with the catheter. Risks include, but not limited to, infection, bleeding, blood clot (thrombus formation), and puncture of an artery; nerve damage and irregular heartbeat and possibility to perform a PICC exchange if needed/ordered by physician.  Alternatives to this procedure were also discussed.  Bard Power PICC patient education guide, fact sheet on infection prevention and patient information card has been provided to patient /or left at bedside.    PICC Placement Documentation  PICC Double Lumen 04/14/62 PICC Right Basilic 42 cm 1 cm (Active)  Indication for Insertion or Continuance of Line Prolonged intravenous therapies 06/19/19 1300  Exposed Catheter (cm) 1 cm 06/19/19 1300  Site Assessment Clean;Dry;Intact 06/19/19 1300  Lumen #1 Status Infusing;Flushed;Blood return noted 06/19/19 1300  Lumen #2 Status Flushed;Saline locked;Blood return noted 06/19/19 1300  Dressing Type Transparent;Securing device 06/19/19 1300  Dressing Status Clean;Dry;Intact;Antimicrobial disc in place 06/19/19 1300  Dressing Change Due 06/26/19 06/19/19 1300       [name] 06/19/2019, 1:28 PM

## 2019-06-19 NOTE — Plan of Care (Signed)
Overnight, patient rested well in between cares.  MD in last PM to discuss next steps in cares.  Patient is in agreement with weighing all options and discussing with team today.  Patient's site looked okay through night without signs of bleeding or complications.  VS WDL with IABP in place at 1:1 ratio augementing around 100 with means around 70 - 100.  Nitro, heparin, and milrinone drip remain on.  Given paint medication overnight for some slight discomfort and anxiety.  Patient able to microshift in bed with help from staff.  No skin issues noted at this time. Plan for today to discuss next steps with team and possible removal of IABP to [place] if decided on today.  Patient is aware of plans. Problem: Education: Goal: Understanding of cardiac disease, CV risk reduction, and recovery process will improve Outcome: Progressing Goal: Understanding of medication regimen will improve Outcome: Progressing   Problem: Cardiac: Goal: Ability to achieve and maintain adequate cardiopulmonary perfusion will improve Outcome: Progressing Goal: Vascular access site(s) Level 0-1 will be maintained Outcome: Progressing   Problem: Clinical Measurements: Goal: Ability to maintain clinical measurements within normal limits will improve Outcome: Progressing Goal: Will remain free from infection Outcome: Progressing Goal: Diagnostic test results will improve Outcome: Progressing Goal: Respiratory complications will improve Outcome: Progressing Goal: Cardiovascular complication will be avoided Outcome: Progressing   Problem: Pain Managment: Goal: General experience of comfort will improve Outcome: Progressing   Problem: Safety: Goal: Ability to remain free from injury will improve Outcome: Progressing   Problem: Skin Integrity: Goal: Risk for impaired skin integrity will decrease Outcome: Progressing   Problem: Activity: Goal: Ability to tolerate increased activity will improve Outcome: Not  Progressing

## 2019-06-19 NOTE — Progress Notes (Signed)
EKG CRITICAL VALUE     12 lead EKG performed.  Critical value noted.Shon Baton, RN notified.   Warren Lacy, CCT 06/19/2019 7:40 AM

## 2019-06-19 NOTE — Progress Notes (Signed)
[place] for Heparin  Indication: chest pain/ACS, multi-vessel CAD, awaiting CVTS evaluation for CABG, IABP in place  Allergies  Allergen Reactions  . Codeine Hives    Patient Measurements: Height: 5\' 9"  (175.3 cm) Weight: 94.9 kg (209 lb 3.5 oz) IBW/kg (Calculated) : 70.7  Vital Signs: Temp: 100 F (37.8 C) (06/18 0900) Temp Source: Core (06/18 0800) BP: 89/62 (06/18 1300) Pulse Rate: 99 (06/18 1300)  Labs: Recent Labs    06/17/19 1958 06/17/19 2023 06/18/19 0119 06/18/19 0119 06/18/19 0424 06/18/19 0425 06/18/19 0911 06/18/19 1228 06/18/19 2013 06/19/19 0402  HGB   < >  --  16.8   < > 16.5  --   --   --   --  15.3  HCT   < >  --  49.7  --  49.1  --   --   --   --  46.2  PLT   < >  --  157  --  155  --   --   --   --  105*  APTT  --  25  --   --   --   --   --   --   --   --   LABPROT  --  12.8 14.5  --   --   --   --   --   --   --   INR  --  1.0 1.2  --   --   --   --   --   --   --   HEPARINUNFRC  --   --   --   --   --  0.43  --  0.35  --  0.18*  CREATININE   < > 0.95 1.02   < >  --   --   --  1.16 0.86 1.08  TROPONINIHS   < > 202* 17,602*  --  17,568*  --  >27,000*  --   --   --    < > = values in this interval not displayed.    Estimated Creatinine Clearance: 87.9 mL/min (by C-G formula based on SCr of 1.08 mg/dL).   Assessment: 54 y/o M transfer from [facility] for CABG. Pt presented to [facility] as CODE STEMI and underwent cath which showed multi-vessel CAD not amendable to PCI. IABP was placed in the cath lab.   Heparin level this AM just slightly subtherapeutic.  Given plans to pull IABP did not increase heparin gtt rate.  IABP now out and heparin off.  Goal of Therapy:  Heparin level 0.2-0.5 units/ml with IABP in place Monitor platelets by anticoagulation protocol: Yes   Plan:  Heparin off. Pharmacy will follow peripherally.  [place] Crane, Roylene Reason, BCCP Clinical Pharmacist  06/19/2019 2:20 PM   [facility]  pharmacy phone numbers are listed on [email or web address]

## 2019-06-19 NOTE — Progress Notes (Signed)
M[facility] EDUCATION NOTE:                Initial VAD teaching completed with pt. Very pleasant and open to information, just "overwhelmed at this time" with sudden onset of hospitalization.   VAD educational packet including "Understanding Your Options with Advanced Heart Failure", "[place] Patient Agreement for VAD Evaluation and Potential Implantation" consent, and [employer] "Living a More Active Life" HM III booklet", "[place] HM III Patient Education", "[place] Mechanical Circulatory Support Program", and "Decision Aids for Left Ventricular Assist Device" reviewed in detail and left at bedside for continued reference.  All questions answered regarding VAD implant, hospital stay, and what to expect when discharged home living with a heart pump. Explained need for need for 24/7 care when pt is discharged home due to sternal precautions, adaptation to living on support, emotional support, consistent and meticulous exit site care and management, medication adherence and high volume of follow up visits with the [facility] after discharge. Pt says he lives alone and did not identify a primary caregiver at this time. He is hoping LVAD will not be necessary.   Explained that LVAD is one of two advanced therapy options, the other being heart transplant. Explained due to smoking status, transplant is not an option at this time. Pt says he needs a few days and primary goal at this time is to get the IABP out so he can get OOB.                                        The patient understands that from this discussion it does not mean that they will receive the device, but that depends on an extensive evaluation process. The patient is aware of the fact that if at anytime they want to stop the evaluation process they can; he does not want to start evaluation at this time.   All questions have been answered at this time and contact information was provided should he encounter any further questions.   Zada Girt,  RN VAD Coordinator   Office: [phone] 24/7 VAD Pager: [phone]

## 2019-06-19 NOTE — Progress Notes (Signed)
IABP 1:2 per heart failure. Pt tolerating at this time.

## 2019-06-19 NOTE — Progress Notes (Signed)
Cath lab personnel removed IABP in pt room. Bed rest began 14:15 until 20:15. Pt tolerated procedure well. RN will continue to monitor.

## 2019-06-19 NOTE — Progress Notes (Addendum)
Patient IABP removed from right femoral arterial. RCIS Smokey Twine present as well. Hemostasis was obtained with manual pressure held for 23 minutes. No hematoma or bruising noted to the site following manual pressure hold. Patient tolerated procedure well with VS remaining stable. Nurse Gaspar Bidding visualized site with Cath Lab staff prior to me placing guaze and tegaderm. Distal pulses present.

## 2019-06-19 NOTE — Progress Notes (Addendum)
Advanced Heart Failure Rounding Note  PCP-Cardiologist: No primary care provider on file.   Subjective:    Currently on milrinone 0.25 mcg + Nitro drip.   CO-OX 64%.   IABP  1:1   No swan numbers.    Denies shortness of breath. Denies chest pain. Anxious about surgery.    Objective:   Weight Range: 94.9 kg Body mass index is 30.9 kg/m.   Vital Signs:   Temp:  [97.9 F (36.6 C)-100 F (37.8 C)] 99.5 F (37.5 C) (06/18 0700) Pulse Rate:  [81-206] 86 (06/18 0500) Resp:  [11-24] 18 (06/18 0700) BP: (102-139)/(32-113) 117/81 (06/18 0700) SpO2:  [90 %-98 %] 94 % (06/18 0700) Arterial Line BP: (74-129)/(54-89) 102/65 (06/18 0700) Weight:  [94.9 kg] 94.9 kg (06/18 0500) Last BM Date: 06/17/19  Weight change: Filed Weights   06/18/19 0045 06/18/19 0530 06/19/19 0500  Weight: 101.6 kg 95.5 kg 94.9 kg    Intake/Output:   Intake/Output Summary (Last 24 hours) at 06/19/2019 0727 Last data filed at 06/19/2019 0700 Gross per 24 hour  Intake 1490.72 ml  Output 2300 ml  Net -809.28 ml      Physical Exam    General:  No resp difficulty HEENT: Normal Neck: Supple. JVP 5-6 . Carotids 2+ bilat; no bruits. No lymphadenopathy or thyromegaly appreciated. Cor: PMI nondisplaced. Regular rate & rhythm. No rubs, gallops or murmurs. Lungs: Clear Abdomen: Soft, nontender, nondistended. No hepatosplenomegaly. No bruits or masses. Good bowel sounds. Extremities: No cyanosis, clubbing, rash, edema. R groin IABP  Neuro: Alert & orientedx3, cranial nerves grossly intact. moves all 4 extremities w/o difficulty. Affect pleasant   Telemetry  NSR 90s   EKG    N/a   Labs    CBC Recent Labs    06/17/19 1958 06/18/19 0119 06/18/19 0424 06/19/19 0402  WBC 12.5*   < > 8.9 10.1  NEUTROABS 10.7*  --   --   --   HGB 16.6   < > 16.5 15.3  HCT 47.4   < > 49.1 46.2  MCV 96.1   < > 99.8 100.0  PLT 172   < > 155 105*   < > = values in this interval not displayed.   Basic  Metabolic Panel Recent Labs    06/18/19 2013 06/19/19 0402  NA 136 135  K 3.5 3.6  CL 101 99  CO2 25 26  GLUCOSE 163* 124*  BUN 12 12  CREATININE 0.86 1.08  CALCIUM 8.8* 8.7*   Liver Function Tests Recent Labs    06/17/19 2023  AST 196*  ALT 161*  ALKPHOS 40  BILITOT 1.4*  PROT 6.3*  ALBUMIN 3.7   No results for input(s): LIPASE, AMYLASE in the last 72 hours. Cardiac Enzymes No results for input(s): CKTOTAL, CKMB, CKMBINDEX, TROPONINI in the last 72 hours.  BNP: BNP (last 3 results) Recent Labs    06/17/19 2023 06/18/19 0119  BNP 751.1* 1,068.5*    ProBNP (last 3 results) No results for input(s): PROBNP in the last 8760 hours.   D-Dimer No results for input(s): DDIMER in the last 72 hours. Hemoglobin A1C No results for input(s): HGBA1C in the last 72 hours. Fasting Lipid Panel Recent Labs    06/19/19 0402  CHOL 196  HDL 59  LDLCALC 123*  TRIG 72  CHOLHDL 3.3   Thyroid Function Tests No results for input(s): TSH, T4TOTAL, T3FREE, THYROIDAB in the last 72 hours.  Invalid input(s): FREET3  Other results:  Imaging    DG Chest 1 View  Result Date: 06/18/2019 CLINICAL DATA:  Chest pain EXAM: CHEST  1 VIEW COMPARISON:  06/17/2019 FINDINGS: Cardiac shadow is enlarged. Swan-Ganz catheter is noted extending into the left pulmonary artery. Intra-aortic balloon pump is noted with the tip just below the aortic knob. Lungs are well aerated without focal infiltrate. Mild central vascular congestion is noted with minimal interstitial edema. IMPRESSION: Mild CHF. Tubes and lines as described. Electronically Signed   By: Inez Catalina M.D.   On: 06/18/2019 10:31   DG CHEST PORT 1 VIEW  Result Date: 06/18/2019 CLINICAL DATA:  Central line care, Swan-Ganz catheter EXAM: PORTABLE CHEST 1 VIEW COMPARISON:  06/18/2019 FINDINGS: Swan-Ganz catheter enters the heart from the IVC, coils with the tip in the right atrium. Cardiomegaly. Mild vascular congestion. No  confluent opacities or effusions. IMPRESSION: Swan-Ganz catheter coils in the heart with the tip in the right atrium. Cardiomegaly, vascular congestion. Electronically Signed   By: Rolm Baptise M.D.   On: 06/18/2019 20:23   VAS US DOPPLER PRE CABG  Result Date: 06/18/2019 PREOPERATIVE VASCULAR EVALUATION  Indications:  Pre-CABG. Risk Factors: Hypertension, coronary artery disease. Limitations:  Pt on balloon pump Performing Technologist: Abram Sander RVS  Examination Guidelines: A complete evaluation includes B-mode imaging, spectral Doppler, color Doppler, and power Doppler as needed of all accessible portions of each vessel. Bilateral testing is considered an integral part of a complete examination. Limited examinations for reoccurring indications may be performed as noted.  Right Carotid Findings: +----------+--------+--------+--------+------------+--------+           PSV cm/sEDV cm/sStenosisDescribe    Comments +----------+--------+--------+--------+------------+--------+ CCA Prox                          heterogenouspatent   +----------+--------+--------+--------+------------+--------+ CCA Distal                        heterogenouspatent   +----------+--------+--------+--------+------------+--------+ ICA Prox                          heterogenouspatent   +----------+--------+--------+--------+------------+--------+ ICA Distal                                    patent   +----------+--------+--------+--------+------------+--------+ ECA                                           patent   +----------+--------+--------+--------+------------+--------+ +----------+--------+-------+--------+------------+           PSV cm/sEDV cmsDescribeArm Pressure +----------+--------+-------+--------+------------+ Subclavian               patent               +----------+--------+-------+--------+------------+ +---------+--------+--------+------+ VertebralPSV cm/sEDV  cm/spatent +---------+--------+--------+------+ Left Carotid Findings: +----------+--------+--------+--------+------------+--------+           PSV cm/sEDV cm/sStenosisDescribe    Comments +----------+--------+--------+--------+------------+--------+ CCA Prox                          heterogenouspatent   +----------+--------+--------+--------+------------+--------+ CCA Distal                        heterogenouspatent   +----------+--------+--------+--------+------------+--------+ ICA Prox  heterogenouspatent   +----------+--------+--------+--------+------------+--------+ ICA Distal                                    patent   +----------+--------+--------+--------+------------+--------+ ECA                                           patent   +----------+--------+--------+--------+------------+--------+ +----------+--------+--------+--------+------------+ SubclavianPSV cm/sEDV cm/sDescribeArm Pressure +----------+--------+--------+--------+------------+                           patent               +----------+--------+--------+--------+------------+ +---------+--------+--------+------+ VertebralPSV cm/sEDV cm/spatent +---------+--------+--------+------+  Summary: Right Carotid: Patient on balloon pump. Left Carotid: Patient on balloon pump. Right ABI: Patient on balloon pump. Left ABI: Patient on balloon pump. Right Upper Extremity: Doppler waveforms remain within normal limits with right radial compression. Doppler waveform obliterate with right ulnar compression. Left Upper Extremity: Doppler waveforms decrease 50% with left radial compression. Doppler waveforms remain within normal limits with left ulnar compression.  Electronically signed by Monica Martinez MD on 06/18/2019 at 3:23:41 PM.    Final       Medications:     Scheduled Medications: . aspirin  81 mg Oral Daily  . atorvastatin  80 mg Oral Daily  .  Chlorhexidine Gluconate Cloth  6 each Topical Daily  . digoxin  0.125 mg Oral Daily  . furosemide  40 mg Intravenous BID  . mouth rinse  15 mL Mouth Rinse BID  . sacubitril-valsartan  1 tablet Oral BID  . sodium chloride flush  10-40 mL Intracatheter Q12H  . sodium chloride flush  3 mL Intravenous Q12H  . spironolactone  12.5 mg Oral Daily     Infusions: . sodium chloride 10 mL/hr at 06/19/19 0700  . sodium chloride Stopped (06/18/19 0953)  . sodium chloride 10 mL/hr at 06/19/19 0700  . heparin 1,200 Units/hr (06/19/19 0700)  . milrinone 0.25 mcg/kg/min (06/19/19 0700)  . nitroGLYCERIN 10 mcg/min (06/19/19 0700)     PRN Medications:  sodium chloride, sodium chloride, sodium chloride, acetaminophen, fentaNYL (SUBLIMAZE) injection, midazolam, morphine injection, nitroGLYCERIN, ondansetron (ZOFRAN) IV, [START ON 06/22/2019] pneumococcal 23 valent vaccine, sodium chloride flush, sodium chloride flush, traZODone    Patient Profile   Aaron [name] --post MI cardiogenic shock/ optimization prior to anticipated CABG, at the request of Dr. Marlou Porch, Cardiology.    Assessment/Plan  1. Acute Anterolateral STEMI/ Multivessel CAD - Emergent cath showed severe multivessel disease w/ 75% mid- distal LM, 100% prox LAD, not amendable to PCI (unable to cross w/ guide wire, probable CTO), 100% prox LCx (probable CTO) and 100% ostial RCA (probable CTO). There were mild to moderate collaterals right to left to the LAD, minimal right to right collaterals an minimal left to left collaterals. LVEF 25% - Hs trop peaked to 17,602>27,000. - No targets for CABG / PCI. May need LVAD  - Continue IV heparin - Continue NTG gtt  - Continue ASA 81 mg - Continue Atorvastatin 80. Check FLP in am. LDL goal < 70   - no ? blocker w/ shock    2.  Acute Systolic Heart Failure ---.>Cardiogenic Shock - 2/2 acute MI. LVEF by LVG 25% - Start to wean IABP and remove later today if CO-OX stable.  -  Place PICC  Then  will remove swan.   -Continue start milrinone 0.25 mcg/kg/min - Volume status stable.  - Continue entresto 24-26 mg twice a day - Increase spiro to 25 mg daily.  - Renal function stable.   3. Hypertension  - reported h/o untreated HTN prior to admit -BP improving.  - on NTG gtt 10 mcg.   5. Anxiety  - anxious post MI and awaiting CABG  - PRN xanax  - trazodone at bedtime for sleep  6. Tobacco Abuse - 30 pack years  - smoking cessation advised   7. Elevated LFTs - AST 196> repeat in am  - ALT 161 > repeat in am  - suspect shock liver in setting of MI + CGS - continue MCS + inotropes - follow trend   8. ETOH  Drinks 2 mixed drinks a night + more on the weekend.  Concern he may have withdrawal. Add CIWA protocol   9. Renal mass - seen on CT - will check MRI tomorrow.  Concern for RCC  Plan to wean IABP today . Place PICC and remove swan.  Not a candidate for transplant for with nicotine abuse.   Blood type pending.   VAD coordinators will meet with him today.  CT surgery consulted.   Length of Stay: 2  Darrick Grinder, NP  06/19/2019, 7:27 AM  Advanced Heart Failure Team Pager [phone] (M-F; 7a - 4p)  Please contact [facility] for night-coverage after hours (4p -7a ) and weekends on [email or web address]  Agree with above.  Remains on IABP 1:1. Co-ox 63%. No further CP. Breathing ok. Luiz Blare now in RA. CVP 3-4. On milrinone 0.25. renal function stable. No bleeding on heparin. Blood type O  General: Sitting up in bed. No resp difficulty HEENT: normal Neck: supple. no JVD. Carotids 2+ bilat; no bruits. No lymphadenopathy or thryomegaly appreciated. Cor: PMI nondisplaced. Regular rate & rhythm. No rubs, gallops or murmurs. Lungs: clear Abdomen: soft, nontender, nondistended. No hepatosplenomegaly. No bruits or masses. Good bowel sounds. Extremities: no cyanosis, clubbing, rash, edema R groin IABP and swan Neuro: alert & orientedx3, cranial nerves grossly intact. moves all  4 extremities w/o difficulty. Affect pleasant  Case reviewed with TCTS and IC team. No targets for CABG or PCI. He has minimal coronary circulation remaining. Will likely need durable VAD this admit.   Will wean IABP today if possible and support with milrinone VAD team to see today. Not transplant candidate with active tobacco use and likaly cannot wait 6 months.  Will need MRI of renal mass seen on CT  CRITICAL CARE Performed by: Glori Bickers  Total critical care time: 35 minutes  Critical care time was exclusive of separately billable procedures and treating other patients.  Critical care was necessary to treat or prevent imminent or life-threatening deterioration.  Critical care was time spent personally by me (independent of midlevel providers or residents) on the following activities: development of treatment plan with patient and/or surrogate as well as nursing, discussions with consultants, evaluation of patient's response to treatment, examination of patient, obtaining history from patient or surrogate, ordering and performing treatments and interventions, ordering and review of laboratory studies, ordering and review of radiographic studies, pulse oximetry and re-evaluation of patient's condition.   Glori Bickers, MD  9:20 AM

## 2019-06-20 LAB — BASIC METABOLIC PANEL
Anion gap: 10 (ref 5–15)
BUN: 14 mg/dL (ref 6–20)
CO2: 24 mmol/L (ref 22–32)
Calcium: 8.6 mg/dL — ABNORMAL LOW (ref 8.9–10.3)
Chloride: 98 mmol/L (ref 98–111)
Creatinine, Ser: 1.01 mg/dL (ref 0.61–1.24)
GFR calc Af Amer: >60 mL/min (ref >60)
GFR calc non Af Amer: >60 mL/min (ref >60)
Glucose, Bld: 177 mg/dL — ABNORMAL HIGH (ref 70–99)
Potassium: 3.7 mmol/L (ref 3.5–5.1)
Sodium: 132 mmol/L — ABNORMAL LOW (ref 135–145)

## 2019-06-20 LAB — CBC
HCT: 47.5 % (ref 39.0–52.0)
Hemoglobin: 16 g/dL (ref 13.0–17.0)
MCH: 33.8 pg (ref 26.0–34.0)
MCHC: 33.7 g/dL (ref 30.0–36.0)
MCV: 100.2 fL — ABNORMAL HIGH (ref 80.0–100.0)
Platelets: 94 10*3/uL — ABNORMAL LOW (ref 150–400)
RBC: 4.74 MIL/uL (ref 4.22–5.81)
RDW: 12.6 % (ref 11.5–15.5)
WBC: 11.4 10*3/uL — ABNORMAL HIGH (ref 4.0–10.5)
nRBC: 0 % (ref 0.0–0.2)

## 2019-06-20 LAB — COOXEMETRY PANEL
Carboxyhemoglobin: 1.4 % (ref 0.5–1.5)
Carboxyhemoglobin: 1.7 % — ABNORMAL HIGH (ref 0.5–1.5)
Methemoglobin: 0.8 % (ref 0.0–1.5)
Methemoglobin: 1 % (ref 0.0–1.5)
O2 Saturation: 60.6 %
O2 Saturation: 60.3 %
Total hemoglobin: 15.4 g/dL (ref 12.0–16.0)
Total hemoglobin: 13.9 g/dL (ref 12.0–16.0)

## 2019-06-20 LAB — TSH: TSH: 5.402 u[IU]/mL — ABNORMAL HIGH (ref 0.350–4.500)

## 2019-06-20 LAB — DIGOXIN LEVEL: Digoxin Level: 0.3 ng/mL — ABNORMAL LOW (ref 0.8–2.0)

## 2019-06-20 LAB — MAGNESIUM: Magnesium: 1.8 mg/dL (ref 1.7–2.4)

## 2019-06-20 MED ORDER — POTASSIUM CHLORIDE 10 MEQ/50ML IV SOLN
10.0000 meq | INTRAVENOUS | Status: AC
  Administered 2019-06-20: 10 meq via INTRAVENOUS

## 2019-06-20 MED ORDER — AMIODARONE HCL IN DEXTROSE 360-4.14 MG/200ML-% IV SOLN
30.0000 mg/h | INTRAVENOUS | Status: DC
  Administered 2019-06-20 – 2019-06-28 (×15): 30 mg/h via INTRAVENOUS
  Filled 2019-06-20 (×2): qty 200
  Filled 2019-06-20: qty 400
  Filled 2019-06-20 (×12): qty 200

## 2019-06-20 MED ORDER — AMIODARONE LOAD VIA INFUSION
150.0000 mg | Freq: Once | INTRAVENOUS | Status: AC
  Administered 2019-06-20: 150 mg via INTRAVENOUS
  Filled 2019-06-20: qty 83.34

## 2019-06-20 MED ORDER — MAGNESIUM SULFATE 2 GM/50ML IV SOLN
INTRAVENOUS | Status: AC
  Filled 2019-06-20: qty 50

## 2019-06-20 MED ORDER — ENOXAPARIN SODIUM 40 MG/0.4ML ~~LOC~~ SOLN
40.0000 mg | SUBCUTANEOUS | Status: DC
  Administered 2019-06-20 – 2019-06-23 (×4): 40 mg via SUBCUTANEOUS
  Filled 2019-06-20 (×4): qty 0.4

## 2019-06-20 MED ORDER — AMIODARONE HCL IN DEXTROSE 360-4.14 MG/200ML-% IV SOLN
60.0000 mg/h | INTRAVENOUS | Status: AC
  Administered 2019-06-20 (×2): 60 mg/h via INTRAVENOUS
  Filled 2019-06-20: qty 200

## 2019-06-20 MED ORDER — POTASSIUM CHLORIDE CRYS ER 20 MEQ PO TBCR
40.0000 meq | EXTENDED_RELEASE_TABLET | Freq: Four times a day (QID) | ORAL | Status: AC
  Administered 2019-06-20 (×2): 40 meq via ORAL
  Filled 2019-06-20 (×2): qty 2

## 2019-06-20 MED ORDER — CLONAZEPAM 0.5 MG PO TABS
0.5000 mg | ORAL_TABLET | Freq: Two times a day (BID) | ORAL | Status: DC
  Administered 2019-06-20 – 2019-07-10 (×38): 0.5 mg via ORAL
  Filled 2019-06-20 (×38): qty 1

## 2019-06-20 MED ORDER — POTASSIUM CHLORIDE 10 MEQ/50ML IV SOLN
INTRAVENOUS | Status: AC
  Administered 2019-06-20: 10 meq via INTRAVENOUS
  Filled 2019-06-20: qty 50

## 2019-06-20 MED ORDER — AMIODARONE HCL IN DEXTROSE 360-4.14 MG/200ML-% IV SOLN
INTRAVENOUS | Status: AC
  Filled 2019-06-20: qty 200

## 2019-06-20 MED ORDER — MAGNESIUM SULFATE 2 GM/50ML IV SOLN
2.0000 g | Freq: Once | INTRAVENOUS | Status: AC
  Administered 2019-06-20: 2 g via INTRAVENOUS
  Filled 2019-06-20: qty 50

## 2019-06-20 NOTE — Progress Notes (Signed)
Advanced Heart Failure Rounding Note  PCP-Cardiologist: No primary care provider on file.   Subjective:    IABP removed 6/18. Currently on milrinone 0.25 mcg  CO-OX 61%. CVP low. Having extensive ventricular triplets and couplets + runs of NSVT  No CP. Mild SOB.   Objective:   Weight Range: 95 kg Body mass index is 30.92 kg/m.   Vital Signs:   Temp:  [98.2 F (36.8 C)-100 F (37.8 C)] 98.2 F (36.8 C) (06/19 0822) Pulse Rate:  [89-131] 107 (06/19 0700) Resp:  [11-30] 19 (06/19 0800) BP: (66-120)/(51-91) 103/80 (06/19 0500) SpO2:  [93 %-100 %] 98 % (06/19 0700) Arterial Line BP: (90-114)/(64-77) 114/77 (06/18 1300) Weight:  [95 kg] 95 kg (06/19 0543) Last BM Date: 06/17/19  Weight change: Filed Weights   06/18/19 0530 06/19/19 0500 06/20/19 0543  Weight: 95.5 kg 94.9 kg 95 kg    Intake/Output:   Intake/Output Summary (Last 24 hours) at 06/20/2019 0837 Last data filed at 06/20/2019 0700 Gross per 24 hour  Intake 1375.17 ml  Output 1250 ml  Net 125.17 ml      Physical Exam    General:  Well appearing. No resp difficulty HEENT: normal Neck: supple. no JVD. Carotids 2+ bilat; no bruits. No lymphadenopathy or thryomegaly appreciated. Cor: PMI nondisplaced. Tachy irregular + ectopy  No rubs, gallops or murmurs. Lungs: clear Abdomen: soft, nontender, nondistended. No hepatosplenomegaly. No bruits or masses. Good bowel sounds. Extremities: no cyanosis, clubbing, rash, edema R groin site ok Neuro: alert & orientedx3, cranial nerves grossly intact. moves all 4 extremities w/o difficulty. Affect pleasant   Telemetry   ST 100-120 extensive ventricular couplets, triplets and NSVT Personally reviewed  Labs    CBC Recent Labs    06/17/19 1958 06/18/19 0119 06/19/19 0402 06/20/19 0416  WBC 12.5*   < > 10.1 11.4*  NEUTROABS 10.7*  --   --   --   HGB 16.6   < > 15.3 16.0  HCT 47.4   < > 46.2 47.5  MCV 96.1   < > 100.0 100.2*  PLT 172   < > 105* 94*   <  > = values in this interval not displayed.   Basic Metabolic Panel Recent Labs    06/19/19 0402 06/20/19 0416  NA 135 132*  K 3.6 3.7  CL 99 98  CO2 26 24  GLUCOSE 124* 177*  BUN 12 14  CREATININE 1.08 1.01  CALCIUM 8.7* 8.6*   Liver Function Tests Recent Labs    06/17/19 2023  AST 196*  ALT 161*  ALKPHOS 40  BILITOT 1.4*  PROT 6.3*  ALBUMIN 3.7   No results for input(s): LIPASE, AMYLASE in the last 72 hours. Cardiac Enzymes No results for input(s): CKTOTAL, CKMB, CKMBINDEX, TROPONINI in the last 72 hours.  BNP: BNP (last 3 results) Recent Labs    06/17/19 2023 06/18/19 0119  BNP 751.1* 1,068.5*    ProBNP (last 3 results) No results for input(s): PROBNP in the last 8760 hours.   D-Dimer No results for input(s): DDIMER in the last 72 hours. Hemoglobin A1C No results for input(s): HGBA1C in the last 72 hours. Fasting Lipid Panel Recent Labs    06/19/19 0402  CHOL 196  HDL 59  LDLCALC 123*  TRIG 72  CHOLHDL 3.3   Thyroid Function Tests No results for input(s): TSH, T4TOTAL, T3FREE, THYROIDAB in the last 72 hours.  Invalid input(s): FREET3  Other results:   Imaging    No  results found.   Medications:     Scheduled Medications: . aspirin  81 mg Oral Daily  . atorvastatin  80 mg Oral Daily  . Chlorhexidine Gluconate Cloth  6 each Topical Daily  . digoxin  0.125 mg Oral Daily  . folic acid  1 mg Oral Daily  . mouth rinse  15 mL Mouth Rinse BID  . multivitamin with minerals  1 tablet Oral Daily  . sacubitril-valsartan  1 tablet Oral BID  . sodium chloride flush  10-40 mL Intracatheter Q12H  . sodium chloride flush  3 mL Intravenous Q12H  . spironolactone  25 mg Oral Daily  . thiamine  100 mg Oral Daily    Infusions: . sodium chloride Stopped (06/19/19 1443)  . sodium chloride Stopped (06/18/19 0953)  . sodium chloride Stopped (06/19/19 1446)  . milrinone 0.25 mcg/kg/min (06/20/19 0700)  . nitroGLYCERIN Stopped (06/19/19 0840)     PRN Medications: sodium chloride, sodium chloride, sodium chloride, acetaminophen, ALPRAZolam, fentaNYL (SUBLIMAZE) injection, morphine injection, nitroGLYCERIN, ondansetron (ZOFRAN) IV, [START ON 06/22/2019] pneumococcal 23 valent vaccine, sodium chloride flush, sodium chloride flush, traZODone    Patient Profile   Aaron [name] --post MI cardiogenic shock/ optimization prior to anticipated CABG, at the request of Dr. Marlou Porch, Cardiology.    Assessment/Plan   1. Acute Anterolateral STEMI/ Multivessel CAD - Emergent cath showed severe multivessel disease w/ 75% mid- distal LM, 100% prox LAD, not amendable to PCI (unable to cross w/ guide wire, probable CTO), 100% prox LCx (probable CTO) and 100% ostial RCA (probable CTO). There were mild to moderate collaterals right to left to the LAD, minimal right to right collaterals an minimal left to left collaterals. LVEF 25% - Hs trop peaked to 17,602>27,000. - No targets for CABG / PCI. May need LVAD  - IABP out - Continue ASA 81 mg - Continue Atorvastatin 80. - no ? blocker w/ shock    2.  Acute Systolic Heart Failure ---.>Cardiogenic Shock - 2/2 acute MI. LVEF by LVG 25% - IABP out 6/18 - On milrinone 0.25 mcg/kg/min. Co-ox 60%. Having lots of ectopy. Will decrease milrinone to 0.125. Low threshold to put IABP back in  - Volume status stable.  - Continue entresto 24-26 mg twice a day - Continue spiro  25 mg daily.  - Continue dig - No b-blocker yet with shock - Case reviewed with TCTS and IC team. No targets for CABG or PCI. He has minimal coronary circulation remaining. Transplant off the table with tobacco use.   - Not tolerating IABP removal easily. Will need durable VAD this admit. Met with VAD team and remains hesitant - Needs w/u of renal mass  3. NSVT - keep K> 4.0 Mg >2.0  - wean milrinone  - start amio - low threshold to put IABP back  4. Hypertension  - reported h/o untreated HTN prior to admit - Blood  pressure well controlled. Continue current regimen.  5. Tobacco Abuse - 30 pack years  - smoking cessation advised   6. Elevated LFTs - AST 196> repeat in am  - ALT 161 > repeat in am  - suspect shock liver in setting of MI + CGS - continue MCS + inotropes - follow trend   7. ETOH  - Drinks 2 mixed drinks a night + more on the weekend.    8. Renal mass - seen on CT -  Concern for RCC. Check MRI  CRITICAL CARE Performed by: Glori Bickers  Total critical care time:  35 minutes  Critical care time was exclusive of separately billable procedures and treating other patients.  Critical care was necessary to treat or prevent imminent or life-threatening deterioration.  Critical care was time spent personally by me (independent of midlevel providers or residents) on the following activities: development of treatment plan with patient and/or surrogate as well as nursing, discussions with consultants, evaluation of patient's response to treatment, examination of patient, obtaining history from patient or surrogate, ordering and performing treatments and interventions, ordering and review of laboratory studies, ordering and review of radiographic studies, pulse oximetry and re-evaluation of patient's condition.    Length of Stay: 3  Glori Bickers, MD  06/20/2019, 8:37 AM  Advanced Heart Failure Team Pager [phone] (M-F; 7a - 4p)  Please contact [facility] for night-coverage after hours (4p -7a ) and weekends on [email or web address]

## 2019-06-20 NOTE — Progress Notes (Signed)
Pt with multiple runs of VT noted.  Pt lying in bed with no complaints.  Dr. Haroldine Laws aware.  Orders received.  Will continue to closely monitor.

## 2019-06-21 ENCOUNTER — Inpatient Hospital Stay (HOSPITAL_COMMUNITY): Payer: BC Managed Care – PPO

## 2019-06-21 DIAGNOSIS — I472 Ventricular tachycardia: Secondary | ICD-10-CM

## 2019-06-21 LAB — COOXEMETRY PANEL
Carboxyhemoglobin: 1.4 % (ref 0.5–1.5)
Carboxyhemoglobin: 1.5 % (ref 0.5–1.5)
Methemoglobin: 1 % (ref 0.0–1.5)
Methemoglobin: 1 % (ref 0.0–1.5)
O2 Saturation: 50.5 %
O2 Saturation: 60.5 %
Total hemoglobin: 14.3 g/dL (ref 12.0–16.0)
Total hemoglobin: 15.2 g/dL (ref 12.0–16.0)

## 2019-06-21 LAB — BASIC METABOLIC PANEL
Anion gap: 10 (ref 5–15)
BUN: 29 mg/dL — ABNORMAL HIGH (ref 6–20)
CO2: 22 mmol/L (ref 22–32)
Calcium: 8.7 mg/dL — ABNORMAL LOW (ref 8.9–10.3)
Chloride: 100 mmol/L (ref 98–111)
Creatinine, Ser: 1.36 mg/dL — ABNORMAL HIGH (ref 0.61–1.24)
GFR calc Af Amer: >60 mL/min (ref >60)
GFR calc non Af Amer: 58 mL/min — ABNORMAL LOW (ref >60)
Glucose, Bld: 130 mg/dL — ABNORMAL HIGH (ref 70–99)
Potassium: 4.5 mmol/L (ref 3.5–5.1)
Sodium: 132 mmol/L — ABNORMAL LOW (ref 135–145)

## 2019-06-21 LAB — CBC
HCT: 45.8 % (ref 39.0–52.0)
Hemoglobin: 15.4 g/dL (ref 13.0–17.0)
MCH: 33.3 pg (ref 26.0–34.0)
MCHC: 33.6 g/dL (ref 30.0–36.0)
MCV: 99.1 fL (ref 80.0–100.0)
Platelets: 92 10*3/uL — ABNORMAL LOW (ref 150–400)
RBC: 4.62 MIL/uL (ref 4.22–5.81)
RDW: 12.7 % (ref 11.5–15.5)
WBC: 10.3 10*3/uL (ref 4.0–10.5)
nRBC: 0 % (ref 0.0–0.2)

## 2019-06-21 MED ORDER — GADOBUTROL 1 MMOL/ML IV SOLN
9.6000 mL | Freq: Once | INTRAVENOUS | Status: AC | PRN
  Administered 2019-06-21: 9.6 mL via INTRAVENOUS

## 2019-06-21 MED ORDER — ALTEPLASE 2 MG IJ SOLR: 2.0000 mg | Freq: Once | INTRAMUSCULAR | Status: DC

## 2019-06-21 MED ORDER — FUROSEMIDE 10 MG/ML IJ SOLN
INTRAMUSCULAR | Status: AC
  Administered 2019-06-21: 40 mg via INTRAVENOUS
  Filled 2019-06-21: qty 4

## 2019-06-21 MED ORDER — FUROSEMIDE 10 MG/ML IJ SOLN
40.0000 mg | Freq: Two times a day (BID) | INTRAMUSCULAR | Status: DC
  Administered 2019-06-21 – 2019-06-22 (×2): 40 mg via INTRAVENOUS
  Filled 2019-06-21 (×2): qty 4

## 2019-06-21 MED ORDER — ALTEPLASE 2 MG IJ SOLR
2.0000 mg | Freq: Once | INTRAMUSCULAR | Status: DC
  Filled 2019-06-21: qty 2

## 2019-06-21 NOTE — Progress Notes (Signed)
Advanced Heart Failure Rounding Note  PCP-Cardiologist: No primary care provider on file.   Subjective:    IABP removed 6/18. Milrinone decreased yesterday due to frequent ectopy. Now on milrinone 0.125 Co-ox 51%. Feels more SOB. CVP 17  On IV amio. Ectopy much improved. Feels ok. Mild SOB. No CP    Objective:   Weight Range: 95.4 kg Body mass index is 31.06 kg/m.   Vital Signs:   Temp:  [98.1 F (36.7 C)-98.7 F (37.1 C)] 98.7 F (37.1 C) (06/20 0700) Pulse Rate:  [88-97] 89 (06/20 1000) Resp:  [14-31] 26 (06/20 1000) BP: (75-125)/(51-106) 99/72 (06/20 0900) SpO2:  [80 %-100 %] 92 % (06/20 1000) Weight:  [95.4 kg] 95.4 kg (06/20 0628) Last BM Date: 06/21/19  Weight change: Filed Weights   06/19/19 0500 06/20/19 0543 06/21/19 0628  Weight: 94.9 kg 95 kg 95.4 kg    Intake/Output:   Intake/Output Summary (Last 24 hours) at 06/21/2019 1018 Last data filed at 06/21/2019 1000 Gross per 24 hour  Intake 969.28 ml  Output 550 ml  Net 419.28 ml      Physical Exam    General:  Lying in bed Mildly SOB  HEENT: normal Neck: supple. JVP to ear. Carotids 2+ bilat; no bruits. No lymphadenopathy or thryomegaly appreciated. Cor: PMI nondisplaced. Regular rate & rhythm. No rubs, gallops or murmurs. Lungs: clear Abdomen: soft, nontender, nondistended. No hepatosplenomegaly. No bruits or masses. Good bowel sounds. Extremities: no cyanosis, clubbing, rash, edema Neuro: alert & orientedx3, cranial nerves grossly intact. moves all 4 extremities w/o difficulty. Affect pleasant  Telemetry   SR 80 ectopy improved Personally reviewed   Labs    CBC Recent Labs    06/20/19 0416 06/21/19 0618  WBC 11.4* 10.3  HGB 16.0 15.4  HCT 47.5 45.8  MCV 100.2* 99.1  PLT 94* 92*   Basic Metabolic Panel Recent Labs    06/20/19 0416 06/21/19 0618  NA 132* 132*  K 3.7 4.5  CL 98 100  CO2 24 22  GLUCOSE 177* 130*  BUN 14 29*  CREATININE 1.01 1.36*  CALCIUM 8.6* 8.7*  MG  1.8  --    Liver Function Tests No results for input(s): AST, ALT, ALKPHOS, BILITOT, PROT, ALBUMIN in the last 72 hours. No results for input(s): LIPASE, AMYLASE in the last 72 hours. Cardiac Enzymes No results for input(s): CKTOTAL, CKMB, CKMBINDEX, TROPONINI in the last 72 hours.  BNP: BNP (last 3 results) Recent Labs    06/17/19 2023 06/18/19 0119  BNP 751.1* 1,068.5*    ProBNP (last 3 results) No results for input(s): PROBNP in the last 8760 hours.   D-Dimer No results for input(s): DDIMER in the last 72 hours. Hemoglobin A1C No results for input(s): HGBA1C in the last 72 hours. Fasting Lipid Panel Recent Labs    06/19/19 0402  CHOL 196  HDL 59  LDLCALC 123*  TRIG 72  CHOLHDL 3.3   Thyroid Function Tests Recent Labs    06/20/19 1319  TSH 5.402*    Other results:   Imaging    No results found.   Medications:     Scheduled Medications:  alteplase  2 mg Intracatheter Once   alteplase  2 mg Intracatheter Once   aspirin  81 mg Oral Daily   atorvastatin  80 mg Oral Daily   Chlorhexidine Gluconate Cloth  6 each Topical Daily   clonazePAM  0.5 mg Oral BID   digoxin  0.125 mg Oral Daily  enoxaparin (LOVENOX) injection  40 mg Subcutaneous J88C   folic acid  1 mg Oral Daily   mouth rinse  15 mL Mouth Rinse BID   multivitamin with minerals  1 tablet Oral Daily   sacubitril-valsartan  1 tablet Oral BID   sodium chloride flush  10-40 mL Intracatheter Q12H   sodium chloride flush  3 mL Intravenous Q12H   spironolactone  25 mg Oral Daily   thiamine  100 mg Oral Daily    Infusions:  sodium chloride Stopped (06/19/19 1443)   sodium chloride Stopped (06/18/19 0953)   sodium chloride Stopped (06/19/19 1446)   amiodarone 30 mg/hr (06/21/19 1000)   milrinone 0.125 mcg/kg/min (06/21/19 1000)   nitroGLYCERIN Stopped (06/19/19 0840)    PRN Medications: sodium chloride, sodium chloride, sodium chloride, acetaminophen, ALPRAZolam,  fentaNYL (SUBLIMAZE) injection, morphine injection, nitroGLYCERIN, ondansetron (ZOFRAN) IV, [START ON 06/22/2019] pneumococcal 23 valent vaccine, sodium chloride flush, sodium chloride flush, traZODone    Patient Profile   Aaron [name] --post MI cardiogenic shock/ optimization prior to anticipated CABG, at the request of Dr. Marlou Porch, Cardiology.    Assessment/Plan   1. Acute Anterolateral STEMI/ Multivessel CAD - Emergent cath showed severe multivessel disease w/ 75% mid- distal LM, 100% prox LAD, not amendable to PCI (unable to cross w/ guide wire, probable CTO), 100% prox LCx (probable CTO) and 100% ostial RCA (probable CTO). There were mild to moderate collaterals right to left to the LAD, minimal right to right collaterals an minimal left to left collaterals. LVEF 25% - Hs trop peaked to 17,602>27,000. - No targets for CABG / PCI. May need LVAD  - IABP out - No CP today - Continue ASA 81 mg - Continue Atorvastatin 80. - no ? blocker w/ shock    2.  Acute Systolic Heart Failure ---.>Cardiogenic Shock - 2/2 acute MI. LVEF by LVG 25% - IABP out 6/18 - On milrinone 01.25 mcg/kg/min. Co-ox pending 51%. Will increase to 0.20.  - Volume status up. CVP 17 Restart lasix - Continue entresto 24-26 mg twice a day - Continue spiro  25 mg daily.  - Continue dig - No b-blocker yet with shock - Case reviewed with TCTS and IC team. No targets for CABG or PCI. He has minimal coronary circulation remaining. Transplant off the table with tobacco use.   - Remains tenuous. Will need durable VAD this admit. Met with VAD team and remains hesitant. Low threshold to put IABP back prior to VAD - Needs w/u of renal mass. Will order MRI abdomen today  3. NSVT - improved - keep K> 4.0 Mg >2.0  - continue amio - low threshold to put IABP back  4. Hypertension  - reported h/o untreated HTN prior to admit - Blood pressure well controlled. Continue current regimen.  5. Tobacco Abuse - 30 pack  years  - smoking cessation advised   6. Elevated LFTs due to shock liver - improved  7. ETOH  - Drinks 2 mixed drinks a night + more on the weekend.    8. Renal mass - seen on CT -  Concern for RCC. Check MRI abdomen today  CRITICAL CARE Performed by: Glori Bickers  Total critical care time: 35 minutes  Critical care time was exclusive of separately billable procedures and treating other patients.  Critical care was necessary to treat or prevent imminent or life-threatening deterioration.  Critical care was time spent personally by me (independent of midlevel providers or residents) on the following activities: development of  treatment plan with patient and/or surrogate as well as nursing, discussions with consultants, evaluation of patient's response to treatment, examination of patient, obtaining history from patient or surrogate, ordering and performing treatments and interventions, ordering and review of laboratory studies, ordering and review of radiographic studies, pulse oximetry and re-evaluation of patient's condition.     Length of Stay: [place], MD  06/21/2019, 10:18 AM  Advanced Heart Failure Team Pager [phone] (M-F; 7a - 4p)  Please contact [facility] for night-coverage after hours (4p -7a ) and weekends on [email or web address]

## 2019-06-22 ENCOUNTER — Other Ambulatory Visit (HOSPITAL_COMMUNITY): Payer: BC Managed Care – PPO

## 2019-06-22 ENCOUNTER — Inpatient Hospital Stay (HOSPITAL_COMMUNITY): Payer: BC Managed Care – PPO

## 2019-06-22 DIAGNOSIS — Z01810 Encounter for preprocedural cardiovascular examination: Secondary | ICD-10-CM

## 2019-06-22 LAB — COOXEMETRY PANEL
Carboxyhemoglobin: 1.4 % (ref 0.5–1.5)
Carboxyhemoglobin: 1.4 % (ref 0.5–1.5)
Carboxyhemoglobin: 1.1 % (ref 0.5–1.5)
Methemoglobin: 1 % (ref 0.0–1.5)
Methemoglobin: 1 % (ref 0.0–1.5)
Methemoglobin: 0.6 % (ref 0.0–1.5)
O2 Saturation: 50.6 %
O2 Saturation: 49.3 %
O2 Saturation: 57.3 %
Total hemoglobin: 15.3 g/dL (ref 12.0–16.0)
Total hemoglobin: 14.9 g/dL (ref 12.0–16.0)
Total hemoglobin: 15.1 g/dL (ref 12.0–16.0)

## 2019-06-22 LAB — RAPID URINE DRUG SCREEN, HOSP PERFORMED
Amphetamines: NOT DETECTED
Barbiturates: NOT DETECTED
Benzodiazepines: POSITIVE — AB
Cocaine: NOT DETECTED
Opiates: NOT DETECTED
Tetrahydrocannabinol: POSITIVE — AB

## 2019-06-22 LAB — BASIC METABOLIC PANEL
Anion gap: 11 (ref 5–15)
BUN: 27 mg/dL — ABNORMAL HIGH (ref 6–20)
CO2: 23 mmol/L (ref 22–32)
Calcium: 8.4 mg/dL — ABNORMAL LOW (ref 8.9–10.3)
Chloride: 96 mmol/L — ABNORMAL LOW (ref 98–111)
Creatinine, Ser: 1.26 mg/dL — ABNORMAL HIGH (ref 0.61–1.24)
GFR calc Af Amer: >60 mL/min (ref >60)
GFR calc non Af Amer: >60 mL/min (ref >60)
Glucose, Bld: 163 mg/dL — ABNORMAL HIGH (ref 70–99)
Potassium: 4 mmol/L (ref 3.5–5.1)
Sodium: 130 mmol/L — ABNORMAL LOW (ref 135–145)

## 2019-06-22 LAB — HEPATITIS B SURFACE ANTIGEN: Hepatitis B Surface Ag: NONREACTIVE

## 2019-06-22 LAB — URINALYSIS, ROUTINE W REFLEX MICROSCOPIC
Bilirubin Urine: NEGATIVE
Glucose, UA: NEGATIVE mg/dL
Hgb urine dipstick: NEGATIVE
Ketones, ur: NEGATIVE mg/dL
Leukocytes,Ua: NEGATIVE
Nitrite: NEGATIVE
Protein, ur: NEGATIVE mg/dL
Specific Gravity, Urine: 1.017 (ref 1.005–1.030)
pH: 5 (ref 5.0–8.0)

## 2019-06-22 LAB — CBC
HCT: 41.9 % (ref 39.0–52.0)
Hemoglobin: 14.1 g/dL (ref 13.0–17.0)
MCH: 33.3 pg (ref 26.0–34.0)
MCHC: 33.7 g/dL (ref 30.0–36.0)
MCV: 99.1 fL (ref 80.0–100.0)
Platelets: 117 10*3/uL — ABNORMAL LOW (ref 150–400)
RBC: 4.23 MIL/uL (ref 4.22–5.81)
RDW: 12.7 % (ref 11.5–15.5)
WBC: 10.1 10*3/uL (ref 4.0–10.5)
nRBC: 0 % (ref 0.0–0.2)

## 2019-06-22 LAB — POCT I-STAT 7, (LYTES, BLD GAS, ICA,H+H)
Acid-Base Excess: 0 mmol/L (ref 0.0–2.0)
Bicarbonate: 22.5 mmol/L (ref 20.0–28.0)
Calcium, Ion: 1.17 mmol/L (ref 1.15–1.40)
HCT: 43 % (ref 39.0–52.0)
Hemoglobin: 14.6 g/dL (ref 13.0–17.0)
O2 Saturation: 93 %
Patient temperature: 97.4
Potassium: 3.6 mmol/L (ref 3.5–5.1)
Sodium: 132 mmol/L — ABNORMAL LOW (ref 135–145)
TCO2: 23 mmol/L (ref 22–32)
pCO2 arterial: 30.5 mmHg — ABNORMAL LOW (ref 32.0–48.0)
pH, Arterial: 7.472 — ABNORMAL HIGH (ref 7.350–7.450)
pO2, Arterial: 58 mmHg — ABNORMAL LOW (ref 83.0–108.0)

## 2019-06-22 LAB — PROTIME-INR
INR: 1.1 (ref 0.8–1.2)
Prothrombin Time: 14.2 seconds (ref 11.4–15.2)

## 2019-06-22 LAB — APTT: aPTT: 31 seconds (ref 24–36)

## 2019-06-22 LAB — PSA: Prostatic Specific Antigen: 0.68 ng/mL (ref 0.00–4.00)

## 2019-06-22 LAB — HEPATITIS C ANTIBODY: HCV Ab: NONREACTIVE

## 2019-06-22 LAB — AMMONIA: Ammonia: 29 umol/L (ref 9–35)

## 2019-06-22 LAB — ANTITHROMBIN III: AntiThromb III Func: 86 % (ref 75–120)

## 2019-06-22 LAB — PREALBUMIN: Prealbumin: 9.3 mg/dL — ABNORMAL LOW (ref 18–38)

## 2019-06-22 LAB — T4, FREE: Free T4: 0.82 ng/dL (ref 0.61–1.12)

## 2019-06-22 LAB — LACTATE DEHYDROGENASE: LDH: 660 U/L — ABNORMAL HIGH (ref 98–192)

## 2019-06-22 MED ORDER — ALTEPLASE 2 MG IJ SOLR
2.0000 mg | Freq: Once | INTRAMUSCULAR | Status: AC
  Administered 2019-06-22: 2 mg

## 2019-06-22 MED ORDER — FUROSEMIDE 10 MG/ML IJ SOLN
80.0000 mg | Freq: Two times a day (BID) | INTRAMUSCULAR | Status: DC
  Administered 2019-06-22 – 2019-06-24 (×5): 80 mg via INTRAVENOUS
  Filled 2019-06-22 (×5): qty 8

## 2019-06-22 MED ORDER — FUROSEMIDE 10 MG/ML IJ SOLN
40.0000 mg | Freq: Once | INTRAMUSCULAR | Status: AC
  Administered 2019-06-22: 40 mg via INTRAVENOUS
  Filled 2019-06-22: qty 4

## 2019-06-22 NOTE — Progress Notes (Signed)
[facility] EDUCATION NOTE:                VAD evaluation consent reviewed and signed by patient and designated caregiver [name] (sister). Continued VAD teaching with pt and caregiver. Sister says she reviewed teaching packet that was left at bedside including: "Understanding Your Options with Advanced Heart Failure", "[facility] Patient Agreement for VAD Evaluation and Potential Implantation" consent, and [employer] "Living a More Active Life" HM III booklet", "[place] HM III Patient Education", "[place] Mechanical Circulatory Support Program", and "Decision Aids for Left Ventricular Assist Device".  All questions answered regarding VAD implant, hospital stay, and what to expect when discharged home living with a heart pump. Pt identified his sister, [name] as his primary caregiver if he should be deemed appropriate for LVAD.  Explained need for 24/7 care when pt is discharged home due to sternal precautions, adaptation to living on support, emotional support, consistent and meticulous exit site care and management, medication adherence and high volume of follow up visits with the [facility] after discharge; both pt and caregiver verbalized understanding of above.   Explained that LVAD can be implanted for two indications in the setting of advanced left ventricular heart failure treatment:  1. Bridge to transplant - used for patients who cannot safely wait for heart transplant without this device.  Or    2. Destination therapy - used for patients until end of life or recovery of heart function.  Patient and caregiver acknowledge that the indication at this point in time for LVAD therapy would be for Destination Therapy due to recent smoking history.  Pt asked if he could meet with a LVAD patient. Plan for current LVAD patient to come to hospital and meet with patient to answer questions about living on MCS.    Pt asked about out of pocket cost of getting LVAD. Explained we will get prior authorization if  required by his insurance prior to implant, but specific out of pocket expenses would be something he would need to discuss with his individual insurance company. Sister emphasized he doesn't have any other options. Explained other options might include palliative care or hospice if Dr. Haroldine Laws deemed necessary. Pt verbalized he wants to undergo the evaluation at this time.   Reinforced need for 24 hour/7 day week caregivers; pt designated [place] as caregiver. He will also need to abide by sternal precautions with no lifting >10lbs, pushing, pulling and will need assistance with adapting to new life style with VAD equipment and care.   [facility]termacs patient survival statistics through December 2020 reviewed with patient and caregiver as follows:                                               The patient understands that from this discussion it does not mean that they will receive the device, but that depends on an extensive evaluation process. The patient is aware of the fact that if at anytime they want to stop the evaluation process they can.  All questions have been answered at this time and contact information was provided should they encounter any further questions.  They are both agreeable at this time to the evaluation process and will move forward.   VAD Social Worker plans on meeting with patient and sister this afternoon to complete formal social evaluation. Both pt and sister are in  agreement to same.   Olevia Perches, RN VAD Coordinator   Office: [phone] 24/7 VAD Pager: [phone]

## 2019-06-22 NOTE — Progress Notes (Signed)
Bilateral lower extremity venous duplex has been completed. Preliminary results can be found in CV Proc through chart review.   06/22/19 1:07 PM Aaron [name] RVT

## 2019-06-22 NOTE — Progress Notes (Addendum)
Advanced Heart Failure Rounding Note  PCP-Cardiologist: No primary care provider on file.   Subjective:    IABP removed 6/18.   Yesterday milrinone was increased to Milrinone 0.20 due to low CO-OX and put back on IV lasix.  Negative > 1.4 liters   Co-ox 50.5%. Repeat now.   Denies SOB. Wants to get up and walk around.      Objective:   Weight Range: 96 kg Body mass index is 31.25 kg/m.   Vital Signs:   Temp:  [97.8 F (36.6 C)-98.4 F (36.9 C)] 98.4 F (36.9 C) (06/21 0400) Pulse Rate:  [86-95] 86 (06/21 0700) Resp:  [14-37] 27 (06/21 0700) BP: (78-152)/(48-115) 100/88 (06/21 0700) SpO2:  [76 %-97 %] 96 % (06/21 0700) Weight:  [96 kg] 96 kg (06/21 0359) Last BM Date: 06/20/19 (per pt)  Weight change: Filed Weights   06/20/19 0543 06/21/19 0628 06/22/19 0359  Weight: 95 kg 95.4 kg 96 kg    Intake/Output:   Intake/Output Summary (Last 24 hours) at 06/22/2019 0732 Last data filed at 06/22/2019 0700 Gross per 24 hour  Intake 756.78 ml  Output 2200 ml  Net -1443.22 ml      Physical Exam   General: In bed  No resp difficulty HEENT: normal Neck: supple. JVP 8-9   Carotids 2+ bilat; no bruits. No lymphadenopathy or thryomegaly appreciated. Cor: PMI nondisplaced. Regular rate & rhythm. No rubs, or murmurs. + S3  Lungs: clear Abdomen: soft, nontender, nondistended. No hepatosplenomegaly. No bruits or masses. Good bowel sounds. Extremities: no cyanosis, clubbing, rash, edema. RUE PICC  Neuro: alert & orientedx3, cranial nerves grossly intact. moves all 4 extremities w/o difficulty. Affect pleasant   Telemetry   SR with frequent PVCs and PACs Personally reviewed   Labs    CBC Recent Labs    06/21/19 0618 06/22/19 0403  WBC 10.3 10.1  HGB 15.4 14.1  HCT 45.8 41.9  MCV 99.1 99.1  PLT 92* 497*   Basic Metabolic Panel Recent Labs    06/20/19 0416 06/20/19 0416 06/21/19 0618 06/22/19 0403  NA 132*   < > 132* 130*  K 3.7   < > 4.5 4.0  CL 98    < > 100 96*  CO2 24   < > 22 23  GLUCOSE 177*   < > 130* 163*  BUN 14   < > 29* 27*  CREATININE 1.01   < > 1.36* 1.26*  CALCIUM 8.6*   < > 8.7* 8.4*  MG 1.8  --   --   --    < > = values in this interval not displayed.   Liver Function Tests No results for input(s): AST, ALT, ALKPHOS, BILITOT, PROT, ALBUMIN in the last 72 hours. No results for input(s): LIPASE, AMYLASE in the last 72 hours. Cardiac Enzymes No results for input(s): CKTOTAL, CKMB, CKMBINDEX, TROPONINI in the last 72 hours.  BNP: BNP (last 3 results) Recent Labs    06/17/19 2023 06/18/19 0119  BNP 751.1* 1,068.5*    ProBNP (last 3 results) No results for input(s): PROBNP in the last 8760 hours.   D-Dimer No results for input(s): DDIMER in the last 72 hours. Hemoglobin A1C No results for input(s): HGBA1C in the last 72 hours. Fasting Lipid Panel No results for input(s): CHOL, HDL, LDLCALC, TRIG, CHOLHDL, LDLDIRECT in the last 72 hours. Thyroid Function Tests Recent Labs    06/20/19 1319  TSH 5.402*    Other results:   Imaging  No results found.   Medications:     Scheduled Medications: . alteplase  2 mg Intracatheter Once  . alteplase  2 mg Intracatheter Once  . aspirin  81 mg Oral Daily  . atorvastatin  80 mg Oral Daily  . Chlorhexidine Gluconate Cloth  6 each Topical Daily  . clonazePAM  0.5 mg Oral BID  . digoxin  0.125 mg Oral Daily  . enoxaparin (LOVENOX) injection  40 mg Subcutaneous Q24H  . folic acid  1 mg Oral Daily  . furosemide  40 mg Intravenous BID  . mouth rinse  15 mL Mouth Rinse BID  . multivitamin with minerals  1 tablet Oral Daily  . sacubitril-valsartan  1 tablet Oral BID  . sodium chloride flush  10-40 mL Intracatheter Q12H  . sodium chloride flush  3 mL Intravenous Q12H  . spironolactone  25 mg Oral Daily  . thiamine  100 mg Oral Daily    Infusions: . sodium chloride Stopped (06/19/19 1443)  . sodium chloride Stopped (06/18/19 0953)  . sodium chloride  Stopped (06/19/19 1446)  . amiodarone 30 mg/hr (06/22/19 0700)  . milrinone 0.2 mcg/kg/min (06/22/19 0700)    PRN Medications: sodium chloride, sodium chloride, sodium chloride, acetaminophen, ALPRAZolam, fentaNYL (SUBLIMAZE) injection, morphine injection, nitroGLYCERIN, ondansetron (ZOFRAN) IV, pneumococcal 23 valent vaccine, sodium chloride flush, sodium chloride flush, traZODone    Patient Profile   Aaron [name] --post MI cardiogenic shock/ optimization prior to anticipated CABG, at the request of Dr. Marlou Porch, Cardiology.    Assessment/Plan   1. Acute Anterolateral STEMI/ Multivessel CAD - Emergent cath showed severe multivessel disease w/ 75% mid- distal LM, 100% prox LAD, not amendable to PCI (unable to cross w/ guide wire, probable CTO), 100% prox LCx (probable CTO) and 100% ostial RCA (probable CTO). There were mild to moderate collaterals right to left to the LAD, minimal right to right collaterals an minimal left to left collaterals. LVEF 25% - Hs trop peaked to 17,602>27,000. - No targets for CABG / PCI. May need LVAD  - IABP out -No chest pain.  - Continue ASA 81 mg - Continue Atorvastatin 80. - no ? blocker w/ shock    2.  Acute Systolic Heart Failure ---.>Cardiogenic Shock - 2/2 acute MI. LVEF by LVG 25% - IABP out 6/18 - On milrinone 0.25 mcg. CO-OX 50.5%. - Continue entresto 24-26 mg twice a day - Continue spiro  25 mg daily.  - CVP - having difficulty with PICC.  - Continue IV lasix.  - Continue dig - No b-blocker yet with shock - Case reviewed with TCTS and IC team. No targets for CABG or PCI. He has minimal coronary circulation remaining. Transplant off the table with tobacco use.   - Remains tenuous. Will need durable VAD this admit. Met with VAD team and remains hesitant. Low threshold to put IABP back prior to VAD - Needs w/u of renal mass. Will order MRI abdomen today - He is not sure he wants to purse.   3. NSVT - improved - keep K> 4.0 Mg >2.0    - continue amio - low threshold to put IABP back  4. Hypertension  - reported h/o untreated HTN prior to admit - Controlled.   5. Tobacco Abuse - 30 pack years  - smoking cessation advised   6. Elevated LFTs due to shock liver - improved  7. ETOH  - Drinks 2 mixed drinks a night + more on the weekend.    8. Renal mass -  seen on CT -  Concern for RCC. MRI abdomen pending.   9. Thrombocytopenia -PLTs on admit 172   -Down to 92>117  - On lovenox  10. Hyponatremia  -Sodium 130.  - Daily BMET     Length of Stay: [place], NP  06/22/2019, 7:32 AM  Advanced Heart Failure Team Pager [phone] (M-F; 7a - 4p)  Please contact [facility] for night-coverage after hours (4p -7a ) and weekends on [email or web address]  Agree with above. Remains very tenuous. On milrinone 0.20. PVC and PAC burden increasing. Co-ox down to 50%. CVP up to 14. Mildly SOB.   MRI abdomen today with R RCC  General:  Lying in bed. Fatigued  No resp difficulty HEENT: normal Neck: supple. JVP to jaw. Carotids 2+ bilat; no bruits. No lymphadenopathy or thryomegaly appreciated. Cor: PMI nondisplaced. Irregular rate & rhythm. No rubs, gallops or murmurs. Lungs: clear Abdomen: soft, nontender, nondistended. No hepatosplenomegaly. No bruits or masses. Good bowel sounds. Extremities: no cyanosis, clubbing, rash, edema Neuro: alert & orientedx3, cranial nerves grossly intact. moves all 4 extremities w/o difficulty. Affect pleasant  Very difficult situation. He is struggling after removal of IABP. Long talk with him about the need for mechanical support this admission. He is willing to consider VAD. Will continue VAD w/u. Will likely need IABP back in prior to surgery.   I suspect PVC burden is hurting CO. Will cut milrinone back to 0.125. Diurese with IV lasix.   Ab MRI with R RCC. Spoke with Radiology. Feel it is indolent. Will d/w TCTS and Urology.   CRITICAL CARE Performed by: Glori Bickers  Total critical care time: 35 minutes  Critical care time was exclusive of separately billable procedures and treating other patients.  Critical care was necessary to treat or prevent imminent or life-threatening deterioration.  Critical care was time spent personally by me (independent of midlevel providers or residents) on the following activities: development of treatment plan with patient and/or surrogate as well as nursing, discussions with consultants, evaluation of patient's response to treatment, examination of patient, obtaining history from patient or surrogate, ordering and performing treatments and interventions, ordering and review of laboratory studies, ordering and review of radiographic studies, pulse oximetry and re-evaluation of patient's condition.  Glori Bickers, MD  11:08 AM

## 2019-06-22 NOTE — Plan of Care (Signed)
Pt is alert and oriented, was originally on room air but was placed on 2L [place] throughout the night due to desaturation. Pt reports history of sleep apnea. Pt can be impulsive and stand up without calling for assistance. He has been encouraged to call due to dizziness upon standing and monitoring equipment lines. Pt denies pain but does report anxiety, pt did toss and turn all night, sat up on the edge of the bed a couple of times. Pt is now resting in bed with eyes closed, call bell is within reach, ed is in lowest position, and bed alarm is on.  Problem: Education: Goal: Understanding of cardiac disease, CV risk reduction, and recovery process will improve Outcome: Progressing   Problem: Activity: Goal: Ability to tolerate increased activity will improve Outcome: Progressing   Problem: Education: Goal: Knowledge of General Education information will improve Description: Including pain rating scale, medication(s)/side effects and non-pharmacologic comfort measures Outcome: Progressing   Problem: Clinical Measurements: Goal: Respiratory complications will improve Outcome: Progressing Goal: Cardiovascular complication will be avoided Outcome: Progressing

## 2019-06-23 ENCOUNTER — Inpatient Hospital Stay (HOSPITAL_COMMUNITY): Payer: BC Managed Care – PPO

## 2019-06-23 DIAGNOSIS — F419 Anxiety disorder, unspecified: Secondary | ICD-10-CM

## 2019-06-23 DIAGNOSIS — I509 Heart failure, unspecified: Secondary | ICD-10-CM

## 2019-06-23 DIAGNOSIS — Z515 Encounter for palliative care: Secondary | ICD-10-CM

## 2019-06-23 DIAGNOSIS — Z7189 Other specified counseling: Secondary | ICD-10-CM

## 2019-06-23 LAB — LUPUS ANTICOAGULANT PANEL
DRVVT: 46.4 s (ref 0.0–47.0)
PTT Lupus Anticoagulant: 44.6 s (ref 0.0–51.9)

## 2019-06-23 LAB — PULMONARY FUNCTION TEST
FEF 25-75 Pre: 2.43 L/sec
FEF2575-%Pred-Pre: 77 %
FEV1-%Pred-Pre: 58 %
FEV1-Pre: 2.12 L
FEV1FVC-%Pred-Pre: 110 %
FEV6-%Pred-Pre: 54 %
FEV6-Pre: 2.5 L
FEV6FVC-%Pred-Pre: 104 %
FVC-%Pred-Pre: 52 %
FVC-Pre: 2.5 L
Pre FEV1/FVC ratio: 85 %
Pre FEV6/FVC Ratio: 100 %

## 2019-06-23 LAB — POCT I-STAT 7, (LYTES, BLD GAS, ICA,H+H)
Acid-Base Excess: 0 mmol/L (ref 0.0–2.0)
Bicarbonate: 22.7 mmol/L (ref 20.0–28.0)
Calcium, Ion: 1.16 mmol/L (ref 1.15–1.40)
HCT: 43 % (ref 39.0–52.0)
Hemoglobin: 14.6 g/dL (ref 13.0–17.0)
O2 Saturation: 97 %
Potassium: 4 mmol/L (ref 3.5–5.1)
Sodium: 131 mmol/L — ABNORMAL LOW (ref 135–145)
TCO2: 24 mmol/L (ref 22–32)
pCO2 arterial: 32 mmHg (ref 32.0–48.0)
pH, Arterial: 7.46 — ABNORMAL HIGH (ref 7.350–7.450)
pO2, Arterial: 82 mmHg — ABNORMAL LOW (ref 83.0–108.0)

## 2019-06-23 LAB — CBC
HCT: 41.4 % (ref 39.0–52.0)
Hemoglobin: 14 g/dL (ref 13.0–17.0)
MCH: 32.9 pg (ref 26.0–34.0)
MCHC: 33.8 g/dL (ref 30.0–36.0)
MCV: 97.2 fL (ref 80.0–100.0)
Platelets: 136 10*3/uL — ABNORMAL LOW (ref 150–400)
RBC: 4.26 MIL/uL (ref 4.22–5.81)
RDW: 12.5 % (ref 11.5–15.5)
WBC: 8.8 10*3/uL (ref 4.0–10.5)
nRBC: 0 % (ref 0.0–0.2)

## 2019-06-23 LAB — HEMOGLOBIN A1C
Hgb A1c MFr Bld: 5.4 % (ref 4.8–5.6)
Mean Plasma Glucose: 108 mg/dL

## 2019-06-23 LAB — COOXEMETRY PANEL
Carboxyhemoglobin: 1.2 % (ref 0.5–1.5)
Methemoglobin: 0.7 % (ref 0.0–1.5)
O2 Saturation: 58 %
Total hemoglobin: 14.3 g/dL (ref 12.0–16.0)

## 2019-06-23 LAB — HEPATIC FUNCTION PANEL
ALT: 125 U/L — ABNORMAL HIGH (ref 0–44)
AST: 123 U/L — ABNORMAL HIGH (ref 15–41)
Albumin: 2.5 g/dL — ABNORMAL LOW (ref 3.5–5.0)
Alkaline Phosphatase: 57 U/L (ref 38–126)
Bilirubin, Direct: 0.2 mg/dL (ref 0.0–0.2)
Indirect Bilirubin: 0.7 mg/dL (ref 0.3–0.9)
Total Bilirubin: 0.9 mg/dL (ref 0.3–1.2)
Total Protein: 6.1 g/dL — ABNORMAL LOW (ref 6.5–8.1)

## 2019-06-23 LAB — URIC ACID: Uric Acid, Serum: 8.1 mg/dL (ref 3.7–8.6)

## 2019-06-23 LAB — BASIC METABOLIC PANEL
Anion gap: 12 (ref 5–15)
BUN: 24 mg/dL — ABNORMAL HIGH (ref 6–20)
CO2: 23 mmol/L (ref 22–32)
Calcium: 8 mg/dL — ABNORMAL LOW (ref 8.9–10.3)
Chloride: 96 mmol/L — ABNORMAL LOW (ref 98–111)
Creatinine, Ser: 1.13 mg/dL (ref 0.61–1.24)
GFR calc Af Amer: >60 mL/min (ref >60)
GFR calc non Af Amer: >60 mL/min (ref >60)
Glucose, Bld: 161 mg/dL — ABNORMAL HIGH (ref 70–99)
Potassium: 3.2 mmol/L — ABNORMAL LOW (ref 3.5–5.1)
Sodium: 131 mmol/L — ABNORMAL LOW (ref 135–145)

## 2019-06-23 LAB — DIGOXIN LEVEL: Digoxin Level: 0.3 ng/mL — ABNORMAL LOW (ref 0.8–2.0)

## 2019-06-23 LAB — HEPATITIS B SURFACE ANTIBODY, QUANTITATIVE: Hep B S AB Quant (Post): <3.1 m[IU]/mL — ABNORMAL LOW (ref >9.9)

## 2019-06-23 MED ORDER — POTASSIUM CHLORIDE CRYS ER 20 MEQ PO TBCR
40.0000 meq | EXTENDED_RELEASE_TABLET | ORAL | Status: AC
  Administered 2019-06-23 (×3): 40 meq via ORAL
  Filled 2019-06-23 (×4): qty 2

## 2019-06-23 MED ORDER — ENSURE ENLIVE PO LIQD
237.0000 mL | Freq: Two times a day (BID) | ORAL | Status: DC
  Administered 2019-06-23 (×2): 237 mL via ORAL

## 2019-06-23 NOTE — Progress Notes (Signed)
ABI completed. Refer to "CV Proc" under chart review to view preliminary results.  06/23/2019 12:08 PM Kelby Aline., MHA, RVT, RDCS, RDMS

## 2019-06-23 NOTE — Progress Notes (Signed)
CSW met with patient and sister at bedside to start the LVAD Psychosocial Assessment. Patient and sister asked appropriate questions and verbalize understanding of work up. Full Psychosocial Assessment to follow. Raquel Sarna, [place], [place]

## 2019-06-23 NOTE — Progress Notes (Signed)
CSW contacted [place] to request that an Advanced Directive be completed with patient prior to potential surgery end of the week. Spiritual Care  Confirmed they will see patient tonight or tomorrow. CSW appreciative of assistance with completion of AD. Raquel Sarna, [place], [place]

## 2019-06-23 NOTE — Progress Notes (Addendum)
Advanced Heart Failure Rounding Note  PCP-Cardiologist: No primary care provider on file.   Subjective:    IABP removed 6/18.   Co-ox 58% on milrinone 0.125 mcg and on amio 30 mg per hour.  Feels ok. Denies chest pain. Able to walk in the unit.      Objective:   Weight Range: 92.4 kg Body mass index is 30.08 kg/m.   Vital Signs:   Temp:  [97.4 F (36.3 C)-98.9 F (37.2 C)] 98.9 F (37.2 C) (06/22 0802) Pulse Rate:  [83-88] 85 (06/22 0800) Resp:  [14-40] 20 (06/22 1000) BP: (86-119)/(49-89) 119/83 (06/22 1000) SpO2:  [94 %-99 %] 94 % (06/22 0800) Weight:  [92.4 kg] 92.4 kg (06/22 0500) Last BM Date: 06/22/19  Weight change: Filed Weights   06/21/19 0628 06/22/19 0359 06/23/19 0500  Weight: 95.4 kg 96 kg 92.4 kg    Intake/Output:   Intake/Output Summary (Last 24 hours) at 06/23/2019 1046 Last data filed at 06/23/2019 1000 Gross per 24 hour  Intake 727.46 ml  Output 1115 ml  Net -387.54 ml      Physical Exam   CVP 6  General:  In bed.  No resp difficulty HEENT: normal Neck: supple. JVP 5-6 . Carotids 2+ bilat; no bruits. No lymphadenopathy or thryomegaly appreciated. Cor: PMI nondisplaced. Regular rate & rhythm. No rubs, or murmurs. +S3  Lungs: clear Abdomen: soft, nontender, nondistended. No hepatosplenomegaly. No bruits or masses. Good bowel sounds. Extremities: no cyanosis, clubbing, rash, edema Neuro: alert & orientedx3, cranial nerves grossly intact. moves all 4 extremities w/o difficulty. Affect pleasant  Telemetry   SR with PVCs   Labs    CBC Recent Labs    06/22/19 0403 06/22/19 0403 06/22/19 1227 06/23/19 0457  WBC 10.1  --   --  8.8  HGB 14.1   < > 14.6 14.0  HCT 41.9   < > 43.0 41.4  MCV 99.1  --   --  97.2  PLT 117*  --   --  136*   < > = values in this interval not displayed.   Basic Metabolic Panel Recent Labs    06/22/19 0403 06/22/19 0403 06/22/19 1227 06/23/19 0457  NA 130*   < > 132* 131*  K 4.0   < > 3.6 3.2*   CL 96*  --   --  96*  CO2 23  --   --  23  GLUCOSE 163*  --   --  161*  BUN 27*  --   --  24*  CREATININE 1.26*  --   --  1.13  CALCIUM 8.4*  --   --  8.0*   < > = values in this interval not displayed.   Liver Function Tests No results for input(s): AST, ALT, ALKPHOS, BILITOT, PROT, ALBUMIN in the last 72 hours. No results for input(s): LIPASE, AMYLASE in the last 72 hours. Cardiac Enzymes No results for input(s): CKTOTAL, CKMB, CKMBINDEX, TROPONINI in the last 72 hours.  BNP: BNP (last 3 results) Recent Labs    06/17/19 2023 06/18/19 0119  BNP 751.1* 1,068.5*    ProBNP (last 3 results) No results for input(s): PROBNP in the last 8760 hours.   D-Dimer No results for input(s): DDIMER in the last 72 hours. Hemoglobin A1C Recent Labs    06/22/19 1159  HGBA1C 5.4   Fasting Lipid Panel No results for input(s): CHOL, HDL, LDLCALC, TRIG, CHOLHDL, LDLDIRECT in the last 72 hours. Thyroid Function Tests Recent Labs  06/20/19 1319  TSH 5.402*    Other results:   Imaging    DG Orthopantogram  Result Date: 06/22/2019 CLINICAL DATA:  Preoperative evaluation. EXAM: ORTHOPANTOGRAM/PANORAMIC COMPARISON:  None. FINDINGS: No fracture or dislocation is noted. No lytic destruction is noted. No significant dental disease is noted. IMPRESSION: Negative. Electronically Signed   By: Marijo Conception M.D.   On: 06/22/2019 18:05   DG Chest 2 View  Result Date: 06/22/2019 CLINICAL DATA:  Preoperative examination. EXAM: CHEST - 2 VIEW COMPARISON:  June 18, 2019. FINDINGS: Stable cardiomegaly with mild central pulmonary vascular congestion. No pneumothorax or pleural effusion is noted. No consolidative process is noted. Right-sided PICC line was noted with distal tip in expected position of the SVC. Bony thorax is unremarkable. IMPRESSION: Stable cardiomegaly with mild central pulmonary vascular congestion. Electronically Signed   By: Marijo Conception M.D.   On: 06/22/2019 18:07   VAS  [place] LOWER EXTREMITY VENOUS (DVT)  Result Date: 06/22/2019  Lower Venous DVTStudy Indications: Pre-op.  Risk Factors: None identified. Comparison Study: No prior studies. Performing Technologist: Oliver Hum RVT  Examination Guidelines: A complete evaluation includes B-mode imaging, spectral Doppler, color Doppler, and power Doppler as needed of all accessible portions of each vessel. Bilateral testing is considered an integral part of a complete examination. Limited examinations for reoccurring indications may be performed as noted. The reflux portion of the exam is performed with the patient in reverse Trendelenburg.  +---------+---------------+---------+-----------+----------+--------------+ RIGHT    CompressibilityPhasicitySpontaneityPropertiesThrombus Aging +---------+---------------+---------+-----------+----------+--------------+ CFV      Full           Yes      Yes                                 +---------+---------------+---------+-----------+----------+--------------+ SFJ      Full                                                        +---------+---------------+---------+-----------+----------+--------------+ FV Prox  Full                                                        +---------+---------------+---------+-----------+----------+--------------+ FV Mid   Full                                                        +---------+---------------+---------+-----------+----------+--------------+ FV DistalFull                                                        +---------+---------------+---------+-----------+----------+--------------+ PFV      Full                                                        +---------+---------------+---------+-----------+----------+--------------+  POP      Full           Yes      Yes                                 +---------+---------------+---------+-----------+----------+--------------+ PTV      Full                                                         +---------+---------------+---------+-----------+----------+--------------+ PERO     Full                                                        +---------+---------------+---------+-----------+----------+--------------+   +---------+---------------+---------+-----------+----------+--------------+ LEFT     CompressibilityPhasicitySpontaneityPropertiesThrombus Aging +---------+---------------+---------+-----------+----------+--------------+ CFV      Full           Yes      Yes                                 +---------+---------------+---------+-----------+----------+--------------+ SFJ      Full                                                        +---------+---------------+---------+-----------+----------+--------------+ FV Prox  Full                                                        +---------+---------------+---------+-----------+----------+--------------+ FV Mid   Full                                                        +---------+---------------+---------+-----------+----------+--------------+ FV DistalFull                                                        +---------+---------------+---------+-----------+----------+--------------+ PFV      Full                                                        +---------+---------------+---------+-----------+----------+--------------+ POP      Full           Yes      Yes                                 +---------+---------------+---------+-----------+----------+--------------+  PTV      Full                                                        +---------+---------------+---------+-----------+----------+--------------+ PERO     Full                                                        +---------+---------------+---------+-----------+----------+--------------+     Summary: RIGHT: - There is no evidence of deep vein thrombosis in the  lower extremity.  - No cystic structure found in the popliteal fossa.  LEFT: - There is no evidence of deep vein thrombosis in the lower extremity.  - No cystic structure found in the popliteal fossa.  *See table(s) above for measurements and observations. Electronically signed by Fabienne Bruns MD on 06/22/2019 at 3:36:10 PM.    Final      Medications:     Scheduled Medications: . alteplase  2 mg Intracatheter Once  . alteplase  2 mg Intracatheter Once  . aspirin  81 mg Oral Daily  . atorvastatin  80 mg Oral Daily  . Chlorhexidine Gluconate Cloth  6 each Topical Daily  . clonazePAM  0.5 mg Oral BID  . digoxin  0.125 mg Oral Daily  . enoxaparin (LOVENOX) injection  40 mg Subcutaneous Q24H  . folic acid  1 mg Oral Daily  . furosemide  80 mg Intravenous BID  . mouth rinse  15 mL Mouth Rinse BID  . multivitamin with minerals  1 tablet Oral Daily  . potassium chloride  40 mEq Oral Q3H  . sacubitril-valsartan  1 tablet Oral BID  . sodium chloride flush  10-40 mL Intracatheter Q12H  . sodium chloride flush  3 mL Intravenous Q12H  . spironolactone  25 mg Oral Daily  . thiamine  100 mg Oral Daily    Infusions: . sodium chloride Stopped (06/19/19 1443)  . sodium chloride Stopped (06/18/19 0953)  . sodium chloride Stopped (06/19/19 1446)  . amiodarone 30 mg/hr (06/23/19 1000)  . milrinone 0.125 mcg/kg/min (06/23/19 1000)    PRN Medications: sodium chloride, sodium chloride, sodium chloride, acetaminophen, ALPRAZolam, fentaNYL (SUBLIMAZE) injection, morphine injection, nitroGLYCERIN, ondansetron (ZOFRAN) IV, pneumococcal 23 valent vaccine, sodium chloride flush, sodium chloride flush, traZODone    Patient Profile   Aaron [name] --post MI cardiogenic shock/ optimization prior to anticipated CABG, at the request of Dr. Anne Fu, Cardiology.    Assessment/Plan   1. Acute Anterolateral STEMI/ Multivessel CAD - Emergent cath showed severe multivessel disease w/ 75% mid- distal LM,  100% prox LAD, not amendable to PCI (unable to cross w/ guide wire, probable CTO), 100% prox LCx (probable CTO) and 100% ostial RCA (probable CTO). There were mild to moderate collaterals right to left to the LAD, minimal right to right collaterals an minimal left to left collaterals. LVEF 25% - Hs trop peaked to 17,602>27,000. - No targets for CABG / PCI.  VAD workup pending.  - IABP out -No chest pain.  - Continue ASA 81 mg - Continue Atorvastatin 80. - no ? blocker w/ shock    2.  Acute Systolic Heart Failure ---.>Cardiogenic Shock - 2/2 acute MI.  LVEF by LVG 25% - IABP out 6/18 - On milrinone 0.125 mcg. CO-OX 58%  - Continue entresto 24-26 mg twice a day - Continue spiro  25 mg daily.  - CVP 5-6.  - Continue IV lasix. K low will supp.  - Continue dig, dig level 0.3.  - No b-blocker yet with shock - Case reviewed with TCTS and IC team. No targets for CABG or PCI. He has minimal coronary circulation remaining. Transplant off the table with tobacco use.   - Remains tenuous. Will need durable VAD this admit. Met with VAD team and remains hesitant. Low threshold to put IABP back prior to VAD  3. NSVT - improved - keep K> 4.0 Mg >2.0  - continue amio - Supp K - low threshold to put IABP back  4. Hypertension  - reported h/o untreated HTN prior to admit - Controlled.   5. Tobacco Abuse - 30 pack years  - smoking cessation advised   6. Elevated LFTs due to shock liver - improved  7. ETOH  - Drinks 2 mixed drinks a night + more on the weekend.    8. Renal mass - seen on CT -  Concern for RCC.  - MRI abdomen- 1.8 cm right renal lesion, most consistent with renal cell carcinoma. No evidence of renal vein involvement or nodal metastasis.  9. Thrombocytopenia -PLTs on admit 172   -Down to 92>117 >136 - On lovenox  10. Hyponatremia  -Sodium 131  Length of Stay: 6  Amy Clegg, NP  06/23/2019, 10:46 AM  Advanced Heart Failure Team Pager [phone] (M-F; [place])    Please contact [facility] for night-coverage after hours (4p -7a ) and weekends on [email or web address]  Agree with above.   Remains very tenuous. Co-ox 58% on milrinone 0.125. CVP improved. MRI ab confirms small RCC  General:  Lying in bed. No resp difficulty HEENT: normal Neck: supple. JVP 6. Carotids 2+ bilat; no bruits. No lymphadenopathy or thryomegaly appreciated. Cor: PMI nondisplaced. Regular rate & rhythm. No rubs, gallops or murmurs. Lungs: clear Abdomen: soft, nontender, nondistended. No hepatosplenomegaly. No bruits or masses. Good bowel sounds. Extremities: no cyanosis, clubbing, rash, edema Neuro: alert & orientedx3, cranial nerves grossly intact. moves all 4 extremities w/o difficulty. Affect pleasant  He is very tenuous. Will need mechanical support this admission. I have discussed case with Dr. Tresa Moore in Urology and R RCC is small and will just require surveillance. - ok to proceed with VAD from their standpoint.   ABIs ok. Will need PFTs and insurance approval. Will d/w further with VAD team.   CRITICAL CARE Performed by: Glori Bickers  Total critical care time: 35 minutes  Critical care time was exclusive of separately billable procedures and treating other patients.  Critical care was necessary to treat or prevent imminent or life-threatening deterioration.  Critical care was time spent personally by me (independent of midlevel providers or residents) on the following activities: development of treatment plan with patient and/or surrogate as well as nursing, discussions with consultants, evaluation of patient's response to treatment, examination of patient, obtaining history from patient or surrogate, ordering and performing treatments and interventions, ordering and review of laboratory studies, ordering and review of radiographic studies, pulse oximetry and re-evaluation of patient's condition.  Glori Bickers, MD  12:19 PM

## 2019-06-23 NOTE — Progress Notes (Signed)
Initial Nutrition Assessment  DOCUMENTATION CODES:   Not applicable  INTERVENTION:    Ensure Enlive po BID, each supplement provides 350 kcal and 20 grams of protein  MVI daily   NUTRITION DIAGNOSIS:   Inadequate oral intake related to poor appetite as evidenced by meal completion < 50%.  GOAL:   Patient will meet greater than or equal to 90% of their needs  MONITOR:   PO intake, Supplement acceptance, Weight trends, Labs, I & O's  REASON FOR ASSESSMENT:   Consult LVAD Eval  ASSESSMENT:   Patient with PMH significant for HTN. Presents this admission with STEMI and acute systolic heart failure.   Pt denies loss in appetite PTA. States he typically consumes three meals daily that consist of B- coffee L-deli sandwich or leftovers D- hamburger helper or air fries chicken with vegetables. Pt consumes 2 mixed drinks each night and more on weekends. Appetite slow to progress this admission. Meal completions charted as 0-50% for his last three meals. Discussed the importance of protein intake for preservation of lean body mass. Pt willing to try Ensure.   Pt endorses a UBW of 210 lb and denies wt loss. Records indicate pt weighed 224 lb on 6/16 and 204 lb this admission. Unable to determine dry weight loss vs fluid fluctuation given hx of CHF.    Drips: milrinone Medications: folic acid, 80 mg lasix BID, MVI with minerals, aldactone, thiamine  Labs: Na 131 (L) K 3.2 (L)   NUTRITION - FOCUSED PHYSICAL EXAM:    Most Recent Value  Orbital Region No depletion  Upper Arm Region No depletion  Thoracic and Lumbar Region No depletion  Buccal Region No depletion  Temple Region No depletion  Clavicle Bone Region No depletion  Clavicle and Acromion Bone Region No depletion  Scapular Bone Region No depletion  Dorsal Hand No depletion  Patellar Region No depletion  Anterior Thigh Region No depletion  Posterior Calf Region No depletion  Edema (RD Assessment) Mild  Hair Reviewed   Eyes Reviewed  Mouth Reviewed  Skin Reviewed  Nails Reviewed     Diet Order:   Diet Order            Diet Heart Room service appropriate? Yes; Fluid consistency: Thin  Diet effective now                 EDUCATION NEEDS:   Not appropriate for education at this time  Skin:  Skin Assessment: Reviewed RN Assessment  Last BM:  6/21  Height:   Ht Readings from Last 1 Encounters:  06/18/19 5\' 9"  (1.753 m)    Weight:   Wt Readings from Last 1 Encounters:  06/23/19 92.4 kg    BMI:  Body mass index is 30.08 kg/m.  Estimated Nutritional Needs:   Kcal:  2300-2500 kcal  Protein:  115-130 grams  Fluid:  >/= 2.3 L/day   Mariana Single RD, LDN Clinical Nutrition Pager listed in [place]

## 2019-06-23 NOTE — Progress Notes (Signed)
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 VAD will be covered under hospitalization. VAD coordinator spoke w/Janice H at [facility].  Tanda Rockers RN, BSN VAD Coordinator 24/7 Pager [phone]

## 2019-06-23 NOTE — Consult Note (Signed)
Reason for Consult: Right Renal Mass, Right Renal Cyst, Prostate Screening  Referring Physician: Charlett Blake MD  Aaron [name] is an 54 y.o. male.   HPI:   1 - Small Right Renal Mass - 2.5 cm Rt mid lateral enhancing solid mass incidental on abd CT then dediated MRI 06/2019. Lesion about 90% enophytic. 1 artery / 1 vein right renovascular anatomy. NO chest lesions.   2 - Minimal Complex Right Renal Cyst - Rt lower pole about 2 cm exophytic cortical cyst w/o neoplastic features or mass effect by CT/MRI 2021.  3 - Prostate Screening - No FHX prostate cancer.  06/2019 - PSA 0.68  PMH sig for CAD/Severe HF/LVAD pending. No prior surgeries. No PCP. 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 6 grand kids.   Today " Aaron [name] " is seen in consultation for small right renal mass. He is undergoing semi-urgent eval for LVAD due to severe ichemic heart failure.   Past Medical History:  Diagnosis Date   Acute anterolateral wall MI (HCC)    CAD, multiple vessel    Hypertension    Ischemic cardiomyopathy    Systolic heart failure (HCC)    Tobacco abuse     Past Surgical History:  Procedure Laterality Date   CORONARY/GRAFT ACUTE MI REVASCULARIZATION N/A 06/17/2019   Procedure: Coronary/Graft Acute MI Revascularization;  Surgeon: Yolonda Kida, MD;  Location: [facility];  Service: Cardiovascular;  Laterality: N/A;   IABP INSERTION N/A 06/17/2019   Procedure: IABP Insertion;  Surgeon: Yolonda Kida, MD;  Location: [facility];  Service: Cardiovascular;  Laterality: N/A;   LEFT HEART CATH AND CORONARY ANGIOGRAPHY N/A 06/17/2019   Procedure: LEFT HEART CATH AND CORONARY ANGIOGRAPHY;  Surgeon: Yolonda Kida, MD;  Location: [facility];  Service: Cardiovascular;  Laterality: N/A;   RIGHT HEART CATH N/A 06/17/2019   Procedure: RIGHT HEART CATH;  Surgeon: Yolonda Kida, MD;  Location: [facility];  Service: Cardiovascular;  Laterality: N/A;    Family History  Problem Relation Age of Onset   CAD Father    Valvular heart disease Father     Social History:  reports that he has been smoking. He has a 45.00 pack-year smoking history. He has never used smokeless tobacco. He reports current alcohol use of about 7.0 - 14.0 standard drinks of alcohol per week. He reports that he does not use drugs.  Allergies:  Allergies  Allergen Reactions   Codeine Hives    Medications: I have reviewed the patient's current medications.  Results for orders placed or performed during the hospital encounter of 06/17/19 (from the past 48 hour(s))  .Cooxemetry Panel (carboxy, met, total hgb, O2 sat)     Status: None   Collection Time: 06/21/19  8:10 PM  Result Value Ref Range   Total hemoglobin 15.2 12.0 - 16.0 g/dL   O2 Saturation 60.5 %   Carboxyhemoglobin 1.5 0.5 - 1.5 %   Methemoglobin 1.0 0.0 - 1.5 %    Comment: Performed at [facility] [address], [place] [address]  CBC     Status: Abnormal   Collection Time: 06/22/19  4:03 AM  Result Value Ref Range   WBC 10.1 4.0 - 10.5 K/uL   RBC 4.23 4.22 - 5.81 MIL/uL   Hemoglobin 14.1 13.0 - 17.0 g/dL   HCT 41.9 39 - 52 %   MCV  99.1 80.0 - 100.0 fL   MCH 33.3 26.0 - 34.0 pg   MCHC 33.7 30.0 - 36.0 g/dL   RDW 12.7 11.5 - 15.5 %   Platelets 117 (L) 150 - 400 K/uL    Comment: CONSISTENT WITH PREVIOUS RESULT Immature Platelet Fraction may be clinically indicated, consider ordering this additional test [id number] REPEATED TO VERIFY    nRBC 0.0 0.0 - 0.2 %    Comment: Performed at [facility], [facility] [facility] [address], [place], [place] [address]  Basic metabolic panel     Status: Abnormal   Collection Time: 06/22/19  4:03 AM  Result Value Ref Range   Sodium 130 (L) 135 - 145 mmol/L   Potassium 4.0 3.5 - 5.1 mmol/L   Chloride 96 (L) 98 - 111 mmol/L   CO2 23 22 - 32 mmol/L   Glucose, Bld 163 (H) 70 - 99 mg/dL     Comment: Glucose reference range applies only to samples taken after fasting for at least 8 hours.   BUN 27 (H) 6 - 20 mg/dL   Creatinine, Ser 1.26 (H) 0.61 - 1.24 mg/dL   Calcium 8.4 (L) 8.9 - 10.3 mg/dL   GFR calc non Af Amer >60 >60 mL/min   GFR calc Af Amer >60 >60 mL/min   Anion gap 11 5 - 15    Comment: Performed at [facility] [address] [place], [place] [address]  Cooxemetry Panel (carboxy, met, total hgb, O2 sat)     Status: None   Collection Time: 06/22/19  4:45 AM  Result Value Ref Range   Total hemoglobin 15.3 12.0 - 16.0 g/dL   O2 Saturation 50.6 %   Carboxyhemoglobin 1.4 0.5 - 1.5 %   Methemoglobin 1.0 0.0 - 1.5 %    Comment: Performed at [facility], [place] [address] [place], [place] [address]  .Cooxemetry Panel (carboxy, met, total hgb, O2 sat)     Status: None   Collection Time: 06/22/19 10:07 AM  Result Value Ref Range   Total hemoglobin 14.9 12.0 - 16.0 g/dL   O2 Saturation 49.3 %   Carboxyhemoglobin 1.4 0.5 - 1.5 %   Methemoglobin 1.0 0.0 - 1.5 %    Comment: Performed at [facility] [address], [place], [place] [address]  Lactate dehydrogenase     Status: Abnormal   Collection Time: 06/22/19 11:59 AM  Result Value Ref Range   LDH 660 (H) 98 - 192 U/L    Comment: Performed at [facility], [place] [address], [place], [place] [address]  PSA     Status: None   Collection Time: 06/22/19 11:59 AM  Result Value Ref Range   Prostatic Specific Antigen 0.68 0.00 - 4.00 ng/mL    Comment: (NOTE) While PSA levels of <=4.0 ng/ml are reported as reference range, some men with levels below 4.0 ng/ml can have prostate cancer and many men with PSA above 4.0 ng/ml do not have prostate cancer.  Other tests such as free PSA, age specific reference ranges, PSA velocity and PSA doubling time may be helpful especially in men less than 53 years old. Performed at [facility], [place] [address] [place],  [address]   Hepatitis B  surface antibody,quantitative     Status: Abnormal   Collection Time: 06/22/19 11:59 AM  Result Value Ref Range   Hepatitis B-Post <3.1 (L) Immunity>9.9 mIU/mL    Comment: (NOTE)  Status of Immunity  Anti-HBs Level  ------------------                     -------------- Inconsistent with Immunity                   0.0 - 9.9 Consistent with Immunity                          >9.9 Performed At: [facility] [place], [place] [id number] Rush Farmer MD [id number]   T4, free     Status: None   Collection Time: 06/22/19 11:59 AM  Result Value Ref Range   Free T4 0.82 0.61 - 1.12 ng/dL    Comment: (NOTE) Biotin ingestion may interfere with free T4 tests. If the results are inconsistent with the TSH level, previous test results, or the clinical presentation, then consider biotin interference. If needed, order repeat testing after stopping biotin. Performed at [facility], [place] [address] [place], [place] [address]   Antithrombin III     Status: None   Collection Time: 06/22/19 11:59 AM  Result Value Ref Range   AntiThromb III Func 86 75 - 120 %    Comment: Performed at [facility] [address], [place], [place] [address]  Protime-INR     Status: None   Collection Time: 06/22/19 11:59 AM  Result Value Ref Range   Prothrombin Time 14.2 11.4 - 15.2 seconds   INR 1.1 0.8 - 1.2    Comment: (NOTE) INR goal varies based on device and disease states. Performed at [facility], [place] [address], [place], [place] [address]   APTT     Status: None   Collection Time: 06/22/19 11:59 AM  Result Value Ref Range   aPTT 31 24 - 36 seconds    Comment: Performed at [facility] [address], [place], [place] [address]  Hemoglobin A1c     Status: None   Collection Time: 06/22/19 11:59 AM  Result Value Ref Range   Hgb A1c MFr Bld 5.4 4.8 - 5.6 %    Comment: (NOTE)         Prediabetes: 5.7 - 6.4         Diabetes: >6.4          Glycemic control for adults with diabetes: <7.0    Mean Plasma Glucose 108 mg/dL    Comment: (NOTE) Performed At: [facility] [place], [place] [id number] Rush Farmer MD [id number]   Prealbumin     Status: Abnormal   Collection Time: 06/22/19 11:59 AM  Result Value Ref Range   Prealbumin 9.3 (L) 18 - 38 mg/dL    Comment: Performed at [facility], [place] [address], [place], [place] [address]  I-STAT 7, (LYTES, BLD GAS, ICA, H+H)     Status: Abnormal   Collection Time: 06/22/19 12:27 PM  Result Value Ref Range   pH, Arterial 7.472 (H) 7.35 - 7.45   pCO2 arterial 30.5 (L) 32 - 48 mmHg   pO2, Arterial 58 (L) 83 - 108 mmHg   Bicarbonate 22.5 20.0 - 28.0 mmol/L   TCO2 23 22 - 32 mmol/L   O2 Saturation 93.0 %   Acid-Base Excess 0.0 0.0 - 2.0 mmol/L   Sodium 132 (L) 135 - 145 mmol/L   Potassium 3.6 3.5 - 5.1 mmol/L   Calcium, Ion 1.17 1.15 - 1.40 mmol/L  HCT 43.0 39 - 52 %   Hemoglobin 14.6 13.0 - 17.0 g/dL   Patient temperature 97.4 F    Collection site Radial    Drawn by RT    Sample type ARTERIAL   Ammonia     Status: None   Collection Time: 06/22/19  4:56 PM  Result Value Ref Range   Ammonia 29 9 - 35 umol/L    Comment: Performed at [facility], [place] [address], [place], [place] [address]  Rapid urine drug screen (hospital performed)     Status: Abnormal   Collection Time: 06/22/19  5:44 PM  Result Value Ref Range   Opiates NONE DETECTED NONE DETECTED   Cocaine NONE DETECTED NONE DETECTED   Benzodiazepines POSITIVE (A) NONE DETECTED   Amphetamines NONE DETECTED NONE DETECTED   Tetrahydrocannabinol POSITIVE (A) NONE DETECTED   Barbiturates NONE DETECTED NONE DETECTED    Comment: (NOTE) DRUG SCREEN FOR MEDICAL PURPOSES ONLY.  IF CONFIRMATION IS NEEDED FOR ANY PURPOSE, NOTIFY LAB WITHIN 5 DAYS.  LOWEST DETECTABLE LIMITS FOR URINE DRUG SCREEN Drug Class                     Cutoff (ng/mL) Amphetamine and metabolites     1000 Barbiturate and metabolites    200 Benzodiazepine                 165 Tricyclics and metabolites     300 Opiates and metabolites        300 Cocaine and metabolites        300 THC                            50 Performed at [facility], [place] [address], [place], [place] [address]   Urinalysis, Routine w reflex microscopic     Status: None   Collection Time: 06/22/19  5:45 PM  Result Value Ref Range   Color, Urine YELLOW YELLOW   APPearance CLEAR CLEAR   Specific Gravity, Urine 1.017 1.005 - 1.030   pH 5.0 5.0 - 8.0   Glucose, UA NEGATIVE NEGATIVE mg/dL   Hgb urine dipstick NEGATIVE NEGATIVE   Bilirubin Urine NEGATIVE NEGATIVE   Ketones, ur NEGATIVE NEGATIVE mg/dL   Protein, ur NEGATIVE NEGATIVE mg/dL   Nitrite NEGATIVE NEGATIVE   Leukocytes,Ua NEGATIVE NEGATIVE    Comment: Performed at [facility] [address] [place], [place] [address]  Hepatitis B surface antigen     Status: None   Collection Time: 06/22/19  6:30 PM  Result Value Ref Range   Hepatitis B Surface Ag NON REACTIVE NON REACTIVE    Comment: Performed at [facility] [address] [place], [place] [address]  Hepatitis C antibody     Status: None   Collection Time: 06/22/19  6:30 PM  Result Value Ref Range   HCV Ab NON REACTIVE NON REACTIVE    Comment: (NOTE) Nonreactive HCV antibody screen is consistent with no HCV infections,  unless recent infection is suspected or other evidence exists to indicate HCV infection.  Performed at [facility], [place] [address], [place], [place] [address]   .Cooxemetry Panel (carboxy, met, total hgb, O2 sat)     Status: None   Collection Time: 06/22/19  6:34 PM  Result Value Ref Range   Total hemoglobin 15.1 12.0 - 16.0 g/dL   O2 Saturation 57.3 %   Carboxyhemoglobin  1.1 0.5 - 1.5 %   Methemoglobin 0.6 0.0 - 1.5 %    Comment: Performed at [facility] [address] [place], [place] [address]  CBC     Status: Abnormal   Collection  Time: 06/23/19  4:57 AM  Result Value Ref Range   WBC 8.8 4.0 - 10.5 K/uL   RBC 4.26 4.22 - 5.81 MIL/uL   Hemoglobin 14.0 13.0 - 17.0 g/dL   HCT 41.4 39 - 52 %   MCV 97.2 80.0 - 100.0 fL   MCH 32.9 26.0 - 34.0 pg   MCHC 33.8 30.0 - 36.0 g/dL   RDW 12.5 11.5 - 15.5 %   Platelets 136 (L) 150 - 400 K/uL    Comment: REPEATED TO VERIFY   nRBC 0.0 0.0 - 0.2 %    Comment: Performed at [facility], [place] [address], [place], [place] [address]  Basic metabolic panel     Status: Abnormal   Collection Time: 06/23/19  4:57 AM  Result Value Ref Range   Sodium 131 (L) 135 - 145 mmol/L   Potassium 3.2 (L) 3.5 - 5.1 mmol/L   Chloride 96 (L) 98 - 111 mmol/L   CO2 23 22 - 32 mmol/L   Glucose, Bld 161 (H) 70 - 99 mg/dL    Comment: Glucose reference range applies only to samples taken after fasting for at least 8 hours.   BUN 24 (H) 6 - 20 mg/dL   Creatinine, Ser 1.13 0.61 - 1.24 mg/dL   Calcium 8.0 (L) 8.9 - 10.3 mg/dL   GFR calc non Af Amer >60 >60 mL/min   GFR calc Af Amer >60 >60 mL/min   Anion gap 12 5 - 15    Comment: Performed at [facility] [address], [place], [place] [address]  Cooxemetry Panel (carboxy, met, total hgb, O2 sat)     Status: None   Collection Time: 06/23/19  4:57 AM  Result Value Ref Range   Total hemoglobin 14.3 12.0 - 16.0 g/dL   O2 Saturation 58.0 %   Carboxyhemoglobin 1.2 0.5 - 1.5 %   Methemoglobin 0.7 0.0 - 1.5 %    Comment: Performed at [facility] [address], [place], [place] [address]  Digoxin level     Status: Abnormal   Collection Time: 06/23/19  4:57 AM  Result Value Ref Range   Digoxin Level 0.3 (L) 0.8 - 2.0 ng/mL    Comment: Performed at [facility], [place] [address], [place], [place] [address]    DG Orthopantogram  Result Date: 06/22/2019 CLINICAL DATA:  Preoperative evaluation. EXAM: ORTHOPANTOGRAM/PANORAMIC COMPARISON:  None. FINDINGS: No fracture or dislocation is noted. No lytic destruction is noted. No  significant dental disease is noted. IMPRESSION: Negative. Electronically Signed   By: Marijo Conception M.D.   On: 06/22/2019 18:05   DG Chest 2 View  Result Date: 06/22/2019 CLINICAL DATA:  Preoperative examination. EXAM: CHEST - 2 VIEW COMPARISON:  June 18, 2019. FINDINGS: Stable cardiomegaly with mild central pulmonary vascular congestion. No pneumothorax or pleural effusion is noted. No consolidative process is noted. Right-sided PICC line was noted with distal tip in expected position of the SVC. Bony thorax is unremarkable. IMPRESSION: Stable cardiomegaly with mild central pulmonary vascular congestion. Electronically Signed   By: Marijo Conception M.D.   On: 06/22/2019 18:07   MR ABDOMEN W WO CONTRAST  Result Date: 06/22/2019 CLINICAL DATA:  Chest CT demonstrating  a right renal lesion. EXAM: MRI ABDOMEN WITHOUT AND WITH CONTRAST TECHNIQUE: Multiplanar multisequence MR imaging of the abdomen was performed both before and after the administration of intravenous contrast. CONTRAST:  9.39m GADAVIST GADOBUTROL 1 MMOL/ML IV SOLN COMPARISON:  CT chest of 06/17/2019 FINDINGS: Portions of exam are mildly motion degraded. Lower chest: Mild cardiomegaly.  Small bilateral pleural effusions. Hepatobiliary: Normal liver. Normal gallbladder, without biliary ductal dilatation. Pancreas:  Normal, without mass or ductal dilatation. Spleen:  Normal in size, without focal abnormality. Adrenals/Urinary Tract: Normal adrenal glands. A 4 mm inter/lower pole left renal lesion is technically too small to characterize but likely a cyst. A minimally complex cyst involving the posterior lower pole right kidney measures 1.4 cm, including on 70/17. No post-contrast enhancement within this lesion, including on subtracted images. Corresponding to the CT abnormality, within the lateral inter/lower pole right kidney, is a 1.8 cm lesion which is T1 hypointense on 61/11, relatively T2 isointense on 26/10. Demonstrates mild arterial phase  hyperenhancement on 60/18 and delayed hypoenhancement, including on 61/24, 27/25. Stomach/Bowel: Normal stomach and abdominal bowel loops. Vascular/Lymphatic: Aortic atherosclerosis. Patent renal veins. No abdominal adenopathy. Other:  No ascites. Musculoskeletal: No acute osseous abnormality. IMPRESSION: 1. Lateral inter/lower pole 1.8 cm right renal lesion, most consistent with renal cell carcinoma. No evidence of renal vein involvement or nodal metastasis. 2. Small bilateral pleural effusions. 3.  Aortic Atherosclerosis (ICD10-I70.0). Electronically Signed   By: KAbigail MiyamotoM.D.   On: 06/22/2019 07:58   VAS [place]PL[place] PRE VAD  Result Date: 06/23/2019 PERIOPERATIVE VASCULAR EVALUATION Indications:      Pre-VAD evaluation. Comparison Study: No prior study Performing Technologist: SMaudry MayhewMHA, RVT, RDCS, RDMS  Examination Guidelines: A complete evaluation includes B-mode imaging, spectral Doppler, color Doppler, and power Doppler as needed of all accessible portions of each vessel. Bilateral testing is considered an integral part of a complete examination. Limited examinations for reoccurring indications may be performed as noted.  Left Carotid Findings: +----------+--------+--------+--------+------------+  Subclavian PSV cm/s EDV cm/s Describe Arm Pressure  +----------+--------+--------+--------+------------+                                        85            +----------+--------+--------+--------+------------+  ABI Findings: +--------+------------------+-----+---------+----------------------------------+  Right    Rt Pressure (mmHg) Index Waveform  Comment                             +--------+------------------+-----+---------+----------------------------------+  Brachial                          triphasic Unable to obtain pressure due to                                                 PICC line                            +--------+------------------+-----+---------+----------------------------------+  PTA      96                 1.13  triphasic                                     +--------+------------------+-----+---------+----------------------------------+  DP       87                 1.02  triphasic                                     +--------+------------------+-----+---------+----------------------------------+ +--------+------------------+-----+---------+-------+  Left     Lt Pressure (mmHg) Index Waveform  Comment  +--------+------------------+-----+---------+-------+  Brachial 85                       triphasic          +--------+------------------+-----+---------+-------+  PTA      103                1.21  triphasic          +--------+------------------+-----+---------+-------+  DP       72                 0.85  triphasic          +--------+------------------+-----+---------+-------+ +-------+---------------+----------------+  ABI/TBI Today's ABI/TBI Previous ABI/TBI  +-------+---------------+----------------+  Right   1.13                              +-------+---------------+----------------+  Left    1.21                              +-------+---------------+----------------+  Right ABI: Resting right ankle-brachial index is within normal range. No evidence of significant right lower extremity arterial disease. Left ABI: Resting left ankle-brachial index is within normal range. No evidence of significant left lower extremity arterial disease.    Preliminary    VAS [place] LOWER EXTREMITY VENOUS (DVT)  Result Date: 06/22/2019  Lower Venous DVTStudy Indications: Pre-op.  Risk Factors: None identified. Comparison Study: No prior studies. Performing Technologist: Oliver Hum RVT  Examination Guidelines: A complete evaluation includes B-mode imaging, spectral Doppler, color Doppler, and power Doppler as needed of all accessible portions of each vessel. Bilateral testing is considered an integral part of a complete  examination. Limited examinations for reoccurring indications may be performed as noted. The reflux portion of the exam is performed with the patient in reverse Trendelenburg.  +---------+---------------+---------+-----------+----------+--------------+  RIGHT     Compressibility Phasicity Spontaneity Properties Thrombus Aging  +---------+---------------+---------+-----------+----------+--------------+  CFV       Full            Yes       Yes                                    +---------+---------------+---------+-----------+----------+--------------+  SFJ       Full                                                             +---------+---------------+---------+-----------+----------+--------------+  FV Prox   Full                                                             +---------+---------------+---------+-----------+----------+--------------+  FV Mid    Full                                                             +---------+---------------+---------+-----------+----------+--------------+  FV Distal Full                                                             +---------+---------------+---------+-----------+----------+--------------+  PFV       Full                                                             +---------+---------------+---------+-----------+----------+--------------+  POP       Full            Yes       Yes                                    +---------+---------------+---------+-----------+----------+--------------+  PTV       Full                                                             +---------+---------------+---------+-----------+----------+--------------+  PERO      Full                                                             +---------+---------------+---------+-----------+----------+--------------+   +---------+---------------+---------+-----------+----------+--------------+  LEFT      Compressibility Phasicity Spontaneity Properties Thrombus Aging   +---------+---------------+---------+-----------+----------+--------------+  CFV       Full            Yes       Yes                                    +---------+---------------+---------+-----------+----------+--------------+  SFJ       Full                                                             +---------+---------------+---------+-----------+----------+--------------+  FV Prox   Full                                                             +---------+---------------+---------+-----------+----------+--------------+  FV Mid    Full                                                             +---------+---------------+---------+-----------+----------+--------------+  FV Distal Full                                                             +---------+---------------+---------+-----------+----------+--------------+  PFV       Full                                                             +---------+---------------+---------+-----------+----------+--------------+  POP       Full            Yes       Yes                                    +---------+---------------+---------+-----------+----------+--------------+  PTV       Full                                                             +---------+---------------+---------+-----------+----------+--------------+  PERO      Full                                                             +---------+---------------+---------+-----------+----------+--------------+     Summary: RIGHT: - There is no evidence of deep vein thrombosis in the lower extremity.  - No cystic structure found in the popliteal fossa.  LEFT: - There is no evidence of deep vein thrombosis in the lower extremity.  - No cystic structure found in the popliteal fossa.  *See table(s) above for measurements and observations. Electronically signed by Ruta Hinds MD on 06/22/2019 at 3:36:10 PM.    Final     Review of Systems  Constitutional: Positive for fatigue. Negative for chills.  Eyes:  Negative.   Cardiovascular: Positive for chest pain and leg swelling.  Gastrointestinal: Negative.   Endocrine: Negative.   Genitourinary: Negative for difficulty urinating and hematuria.  Allergic/Immunologic: Negative.   Neurological: Negative.   Hematological: Negative.   Psychiatric/Behavioral: Negative.    Blood pressure 92/70, pulse 90, temperature 97.8 F (36.6 C), temperature source Oral, resp. rate 12, height _0  (1.753 m), weight 92.4 kg, SpO2 94 %. Physical Exam  Constitutional:  Very pleasant in CICU  HENT:  Nose: Nose normal.  Eyes: Pupils are equal, round, and reactive to light.  Cardiovascular: Normal  pulses.  Respiratory: Effort normal.  GI:  Mild obesity  Genitourinary:    Genitourinary Comments: No CVAT   Musculoskeletal:     Cervical back: Normal range of motion.  Neurological: He is alert.  Skin: Skin is warm.  Psychiatric: Mood normal.    Assessment/Plan:  1 - Small Right Renal Mass - likely stage 1 renal cancer. Discussed that at least 70% of such lesions remain indolent and given his significant CV comorbidity, surveillance preferred. Consider therapy (ablation) if progression.   2 - Minimal Complex Right Renal Cyst - essentially nil clinical significance. Observe.   3 - Prostate Screening - Up to date. Minimal indication for further screening given his CV comorbidity.  WE will arrange outpatient FU in about 37mo with repeat renal imaging, then yearly if stable.   Please call me directly with questions anytime.   TAlexis Frock6/22/2021, 12:35 PM

## 2019-06-23 NOTE — Consult Note (Signed)
Consultation Note Date: 06/23/2019   Patient Name: Aaron [name]  DOB: June 30, 1965  MRN: [id number]  Age / Sex: 54 y.o., male  PCP: Patient, No Pcp Per Referring Physician: Sueanne Margarita, MD  Reason for Consultation:    LVAD  HPI/Patient Profile: 54 y.o. male   admitted on 06/17/2019 with   PMH of untreated HTN and tobacco use (30 pack years) but no prior cardiac history, admitted to [facility] 6/16 w/ acute anterolateral STEMI.   Emergent cath showed severe multivessel disease w/ 75% mid- distal LM, 100% prox LAD, not amendable to PCI (unable to cross w/ guide wire, probable CTO), 100% prox LCx (probable CTO) and 100% ostial RCA (probable CTO).   There were mild to moderate collaterals right to left to the LAD, minimal right to right collaterals an minimal left to left collaterals. RHC demonstrated severe pulmonary HTN and elevated LVEDP. LVG demonstrated severely reduced LVEF, 25%, and mild-mod AI. IABP +  Tr ansferred to [facility] for further management. Cardiac cath demonstrated no viable targets for CABG.   Current work-up for LVAD  Patient faces treatment option decisions, advanced directives and the emotional and physical impact of serious life limiting illness.    Clinical Assessment and Goals of Care:   This NP Wadie Lessen reviewed medical records, received report from team, assessed the patient and then meet at the bedside along with sister/ Aaron [name] to discuss advanced directives and a preparedness plan in light of scheduled LVAD implantation tomorrow morning.  This is a bridge to possible transplantation therapy in the future.   A detailed discussion was had today regarding the concept of a preparedness plan as it relates to LVAD therpay with intention of bridge to transplantation.   Patient and family were comfortable talking about the "what ifs"  and the importance of today's conversation  so everyone can have all the information to be full participants and to understand the patient's basic beliefs and wishes as it relates  to healthcare.  Concepts specific to future possibilities of -long term ventilation -artificial feeding and hydration -long term antibiotic use -dialysis -psychological adjustments -need to terminate the pump- Other chronic or terminal disease unrelated to cardiac LVAD  Dr. Haroldine Laws to the bedside for ongoing education regarding current medical situation and treatment plan.  Patient's sister raises her questions and concerns regarding noted renal mass and underlying significant arterial disease.  Questions and concerns addressed.  Patient was able to verbalize to his family the importance of quality of life.    He is hopeful that LVAD procedure will increase quantity and quality of life.  He verbalizes and understands the risks and benefits of the procedure as it relates to his future.  At this time patient is open to all offered and available medical interventions to prolong life and the success of the LVAD therapy. He shared and verbalized an understanding that his sister would know when the burdens of treatment would outweigh the benefits and trust in her decisions and  support at that time.  Patient is hopeful and optimistic for the future  A MOST form was introduced.  Plan is for advanced directives to be completed by spiritual care prior to surgery this week.    Patient and family were encouraged to continue conversation into the future as it is vital for the patient centered care.   SUMMARY OF RECOMMENDATIONS    Code Status/Advance Care Planning:  Full code   Symptom Management:   Anxiety: Discussed with patient utilization of  antianxiety medications.  Education offered to Mr. Nessler on relaxation breathing.  Demonstration offered and patient returned demonstration.   Psycho-social/Spiritual:   Desire for further Chaplaincy  support:yes  Created space and opportunity for patient and family to explore the thoughts and feelings regarding current medical situation.  Emotional support offered.  PMT will continue to support holistically        Primary Diagnoses: Present on Admission: . STEMI (ST elevation myocardial infarction) ([place])   I have reviewed the medical record, interviewed the patient and family, and examined the patient. The following aspects are pertinent.  Past Medical History:  Diagnosis Date  . Acute anterolateral wall MI ([place])   . CAD, multiple vessel   . Hypertension   . Ischemic cardiomyopathy   . Systolic heart failure ([place])   . Tobacco abuse    Social History   Socioeconomic History  . Marital status: Married    Spouse name: Not on file  . Number of children: Not on file  . Years of education: Not on file  . Highest education level: Not on file  Occupational History  . Not on file  Tobacco Use  . Smoking status: Current Every Day Smoker    Packs/day: 1.50    Years: 30.00    Pack years: 45.00  . Smokeless tobacco: Never Used  Substance and Sexual Activity  . Alcohol use: Yes    Alcohol/week: 7.0 - 14.0 standard drinks    Types: 7 - 14 Standard drinks or equivalent per week  . Drug use: Never  . Sexual activity: Not on file  Other Topics Concern  . Not on file  Social History Narrative  . Not on file   Social Determinants of Health   Financial Resource Strain:   . Difficulty of Paying Living Expenses:   Food Insecurity:   . Worried About Charity fundraiser in the Last Year:   . Arboriculturist in the Last Year:   Transportation Needs:   . Film/video editor (Medical):   Marland Kitchen Lack of Transportation (Non-Medical):   Physical Activity:   . Days of Exercise per Week:   . Minutes of Exercise per Session:   Stress:   . Feeling of Stress :   Social Connections:   . Frequency of Communication with Friends and Family:   . Frequency of Social Gatherings with  Friends and Family:   . Attends Religious Services:   . Active Member of Clubs or Organizations:   . Attends Archivist Meetings:   Marland Kitchen Marital Status:    Family History  Problem Relation Age of Onset  . CAD Father   . Valvular heart disease Father    Scheduled Meds: . alteplase  2 mg Intracatheter Once  . alteplase  2 mg Intracatheter Once  . aspirin  81 mg Oral Daily  . atorvastatin  80 mg Oral Daily  . Chlorhexidine Gluconate Cloth  6 each Topical Daily  . clonazePAM  0.5 mg Oral BID  . digoxin  0.125 mg Oral Daily  . enoxaparin (LOVENOX) injection  40 mg Subcutaneous Q24H  . feeding supplement (ENSURE ENLIVE)  237 mL Oral BID BM  . folic acid  1 mg Oral Daily  . furosemide  80 mg Intravenous BID  . mouth rinse  15 mL Mouth Rinse BID  . multivitamin with minerals  1 tablet Oral Daily  . potassium chloride  40 mEq Oral Q3H  . sacubitril-valsartan  1 tablet Oral BID  . sodium chloride flush  10-40 mL Intracatheter Q12H  . sodium chloride flush  3 mL Intravenous Q12H  . spironolactone  25 mg Oral Daily  . thiamine  100 mg Oral Daily   Continuous Infusions: . sodium chloride Stopped (06/19/19 1443)  . sodium chloride Stopped (06/18/19 0953)  . sodium chloride Stopped (06/19/19 1446)  . amiodarone 30 mg/hr (06/23/19 1100)  . milrinone 0.125 mcg/kg/min (06/23/19 1100)   PRN Meds:.sodium chloride, sodium chloride, sodium chloride, acetaminophen, ALPRAZolam, fentaNYL (SUBLIMAZE) injection, morphine injection, nitroGLYCERIN, ondansetron (ZOFRAN) IV, pneumococcal 23 valent vaccine, sodium chloride flush, sodium chloride flush, traZODone Medications Prior to Admission:  Prior to Admission medications   Medication Sig Start Date End Date Taking? Authorizing Provider  heparin 25000-0.45 UT/250ML-% infusion Inject 1,200 Units/hr into the vein continuous. 06/17/19   Callwood, Loran Senters, MD   Allergies  Allergen Reactions  . Codeine Hives   Review of Systems  Neurological:  Positive for weakness.    Physical Exam Cardiovascular:     Rate and Rhythm: Normal rate.  Pulmonary:     Effort: Pulmonary effort is normal.  Skin:    General: Skin is warm and dry.  Neurological:     Mental Status: He is alert and oriented to person, place, and time.  Psychiatric:        Mood and Affect: Mood normal.     Vital Signs: BP 91/69   Pulse 84   Temp 97.8 F (36.6 C) (Oral)   Resp 18   Ht 5\' 9"  (1.753 m)   Wt 92.4 kg   SpO2 97%   BMI 30.08 kg/m  Pain Scale: 0-10   Pain Score: 0-No pain   SpO2: SpO2: 97 % O2 Device:SpO2: 97 % O2 Flow Rate: .O2 Flow Rate (L/min): 2 L/min  IO: Intake/output summary:   Intake/Output Summary (Last 24 hours) at 06/23/2019 1132 Last data filed at 06/23/2019 1100 Gross per 24 hour  Intake 725.35 ml  Output 1115 ml  Net -389.65 ml    LBM: Last BM Date: 06/22/19 Baseline Weight: Weight: 101.6 kg Most recent weight: Weight: 92.4 kg     Palliative Assessment/Data:   Discussed with Dr Haroldine Laws and Kennyth Lose LCSW  Time In: 1200 Time Out: 1310 Time Total: 70 minutes Greater than 50%  of this time was spent counseling and coordinating care related to the above assessment and plan.  Signed by: Wadie Lessen, NP   Please contact Palliative Medicine Team phone at [phone] for questions and concerns.  For individual provider: See Shea Evans

## 2019-06-24 ENCOUNTER — Telehealth (HOSPITAL_COMMUNITY): Payer: Self-pay | Admitting: Licensed Clinical Social Worker

## 2019-06-24 ENCOUNTER — Inpatient Hospital Stay (HOSPITAL_COMMUNITY): Payer: BC Managed Care – PPO

## 2019-06-24 ENCOUNTER — Encounter (HOSPITAL_COMMUNITY)
Admission: AD | Disposition: A | Discharge status: Rehabilitation Facility | Payer: Self-pay | Source: Other Acute Inpatient Hospital | Attending: Cardiothoracic Surgery

## 2019-06-24 DIAGNOSIS — Z515 Encounter for palliative care: Secondary | ICD-10-CM | POA: Insufficient documentation

## 2019-06-24 DIAGNOSIS — Z7189 Other specified counseling: Secondary | ICD-10-CM

## 2019-06-24 DIAGNOSIS — F419 Anxiety disorder, unspecified: Secondary | ICD-10-CM | POA: Insufficient documentation

## 2019-06-24 DIAGNOSIS — I509 Heart failure, unspecified: Secondary | ICD-10-CM | POA: Insufficient documentation

## 2019-06-24 DIAGNOSIS — R570 Cardiogenic shock: Secondary | ICD-10-CM

## 2019-06-24 HISTORY — PX: IABP INSERTION: CATH118242

## 2019-06-24 HISTORY — PX: RIGHT HEART CATH: CATH118263

## 2019-06-24 LAB — URINALYSIS, MICROSCOPIC (REFLEX)

## 2019-06-24 LAB — POCT I-STAT EG7
Acid-Base Excess: 2 mmol/L (ref 0.0–2.0)
Acid-Base Excess: 2 mmol/L (ref 0.0–2.0)
Bicarbonate: 27.8 mmol/L (ref 20.0–28.0)
Bicarbonate: 27.4 mmol/L (ref 20.0–28.0)
Calcium, Ion: 1.18 mmol/L (ref 1.15–1.40)
Calcium, Ion: 1.17 mmol/L (ref 1.15–1.40)
HCT: 46 % (ref 39.0–52.0)
HCT: 45 % (ref 39.0–52.0)
Hemoglobin: 15.6 g/dL (ref 13.0–17.0)
Hemoglobin: 15.3 g/dL (ref 13.0–17.0)
O2 Saturation: 67 %
O2 Saturation: 63 %
Potassium: 3.9 mmol/L (ref 3.5–5.1)
Potassium: 3.9 mmol/L (ref 3.5–5.1)
Sodium: 135 mmol/L (ref 135–145)
Sodium: 135 mmol/L (ref 135–145)
TCO2: 29 mmol/L (ref 22–32)
TCO2: 29 mmol/L (ref 22–32)
pCO2, Ven: 45.5 mmHg (ref 44.0–60.0)
pCO2, Ven: 45.1 mmHg (ref 44.0–60.0)
pH, Ven: 7.394 (ref 7.250–7.430)
pH, Ven: 7.392 (ref 7.250–7.430)
pO2, Ven: 35 mmHg (ref 32.0–45.0)
pO2, Ven: 33 mmHg (ref 32.0–45.0)

## 2019-06-24 LAB — HEPARIN LEVEL (UNFRACTIONATED): Heparin Unfractionated: 0.16 IU/mL — ABNORMAL LOW (ref 0.30–0.70)

## 2019-06-24 LAB — POCT I-STAT 7, (LYTES, BLD GAS, ICA,H+H)
Acid-base deficit: 2 mmol/L (ref 0.0–2.0)
Bicarbonate: 21.3 mmol/L (ref 20.0–28.0)
Calcium, Ion: 0.82 mmol/L — CL (ref 1.15–1.40)
HCT: 37 % — ABNORMAL LOW (ref 39.0–52.0)
Hemoglobin: 12.6 g/dL — ABNORMAL LOW (ref 13.0–17.0)
O2 Saturation: 99 %
Potassium: 3 mmol/L — ABNORMAL LOW (ref 3.5–5.1)
Sodium: 141 mmol/L (ref 135–145)
TCO2: 22 mmol/L (ref 22–32)
pCO2 arterial: 30.7 mmHg — ABNORMAL LOW (ref 32.0–48.0)
pH, Arterial: 7.448 (ref 7.350–7.450)
pO2, Arterial: 144 mmHg — ABNORMAL HIGH (ref 83.0–108.0)

## 2019-06-24 LAB — URINALYSIS, ROUTINE W REFLEX MICROSCOPIC
Bilirubin Urine: NEGATIVE
Glucose, UA: NEGATIVE mg/dL
Ketones, ur: NEGATIVE mg/dL
Leukocytes,Ua: NEGATIVE
Nitrite: NEGATIVE
Protein, ur: NEGATIVE mg/dL
Specific Gravity, Urine: 1.025 (ref 1.005–1.030)
pH: 5.5 (ref 5.0–8.0)

## 2019-06-24 LAB — BASIC METABOLIC PANEL
Anion gap: 11 (ref 5–15)
BUN: 20 mg/dL (ref 6–20)
CO2: 24 mmol/L (ref 22–32)
Calcium: 8.5 mg/dL — ABNORMAL LOW (ref 8.9–10.3)
Chloride: 96 mmol/L — ABNORMAL LOW (ref 98–111)
Creatinine, Ser: 1.23 mg/dL (ref 0.61–1.24)
GFR calc Af Amer: >60 mL/min (ref >60)
GFR calc non Af Amer: >60 mL/min (ref >60)
Glucose, Bld: 129 mg/dL — ABNORMAL HIGH (ref 70–99)
Potassium: 3.9 mmol/L (ref 3.5–5.1)
Sodium: 131 mmol/L — ABNORMAL LOW (ref 135–145)

## 2019-06-24 LAB — COOXEMETRY PANEL
Carboxyhemoglobin: 1 % (ref 0.5–1.5)
Methemoglobin: 0.6 % (ref 0.0–1.5)
O2 Saturation: 56.5 %
Total hemoglobin: 16.2 g/dL — ABNORMAL HIGH (ref 12.0–16.0)

## 2019-06-24 LAB — CBC
HCT: 45 % (ref 39.0–52.0)
Hemoglobin: 15.4 g/dL (ref 13.0–17.0)
MCH: 33.3 pg (ref 26.0–34.0)
MCHC: 34.2 g/dL (ref 30.0–36.0)
MCV: 97.4 fL (ref 80.0–100.0)
Platelets: 163 10*3/uL (ref 150–400)
RBC: 4.62 MIL/uL (ref 4.22–5.81)
RDW: 12.5 % (ref 11.5–15.5)
WBC: 8.8 10*3/uL (ref 4.0–10.5)
nRBC: 0 % (ref 0.0–0.2)

## 2019-06-24 SURGERY — RIGHT HEART CATH
Anesthesia: LOCAL

## 2019-06-24 MED ORDER — VANCOMYCIN HCL 1500 MG/300ML IV SOLN
1500.0000 mg | INTRAVENOUS | Status: AC
  Administered 2019-06-25: 1500 mg via INTRAVENOUS
  Filled 2019-06-24: qty 300

## 2019-06-24 MED ORDER — HYDRALAZINE HCL 20 MG/ML IJ SOLN: 10.0000 mg | INTRAMUSCULAR | Status: AC | PRN

## 2019-06-24 MED ORDER — SODIUM CHLORIDE 0.9% IV SOLUTION: INTRAVENOUS | Status: DC | PRN

## 2019-06-24 MED ORDER — DOPAMINE-DEXTROSE 3.2-5 MG/ML-% IV SOLN
0.0000 ug/kg/min | INTRAVENOUS | Status: DC
  Filled 2019-06-24: qty 250

## 2019-06-24 MED ORDER — SODIUM CHLORIDE 0.9% FLUSH: 3.0000 mL | INTRAVENOUS | Status: DC | PRN

## 2019-06-24 MED ORDER — MAGNESIUM SULFATE 50 % IJ SOLN
40.0000 meq | INTRAMUSCULAR | Status: DC
  Filled 2019-06-24: qty 9.85

## 2019-06-24 MED ORDER — DOBUTAMINE IN D5W 4-5 MG/ML-% IV SOLN
2.0000 ug/kg/min | INTRAVENOUS | Status: DC
  Filled 2019-06-24: qty 250

## 2019-06-24 MED ORDER — INSULIN REGULAR(HUMAN) IN NACL 100-0.9 UT/100ML-% IV SOLN
INTRAVENOUS | Status: DC
  Filled 2019-06-24: qty 100

## 2019-06-24 MED ORDER — SODIUM CHLORIDE 0.9 % IV SOLN: INTRAVENOUS | Status: DC

## 2019-06-24 MED ORDER — NITROGLYCERIN IN D5W 200-5 MCG/ML-% IV SOLN
0.0000 ug/min | INTRAVENOUS | Status: DC
  Filled 2019-06-24: qty 250

## 2019-06-24 MED ORDER — NOREPINEPHRINE 4 MG/250ML-% IV SOLN
0.0000 ug/min | INTRAVENOUS | Status: DC
  Filled 2019-06-24: qty 250

## 2019-06-24 MED ORDER — FENTANYL CITRATE (PF) 100 MCG/2ML IJ SOLN
INTRAMUSCULAR | Status: AC
  Filled 2019-06-24: qty 2

## 2019-06-24 MED ORDER — DEXMEDETOMIDINE HCL IN NACL 400 MCG/100ML IV SOLN
0.1000 ug/kg/h | INTRAVENOUS | Status: AC
  Administered 2019-06-25: .5 ug/kg/h via INTRAVENOUS
  Filled 2019-06-24: qty 100

## 2019-06-24 MED ORDER — SODIUM CHLORIDE 0.9 % IV SOLN
1.5000 g | INTRAVENOUS | Status: DC
  Filled 2019-06-24: qty 1.5

## 2019-06-24 MED ORDER — NOREPINEPHRINE BITARTRATE 1 MG/ML IV SOLN
INTRAVENOUS | Status: AC | PRN
  Administered 2019-06-24: 5 ug/kg/min via INTRAVENOUS

## 2019-06-24 MED ORDER — MILRINONE LACTATE IN DEXTROSE 20-5 MG/100ML-% IV SOLN
0.3000 ug/kg/min | INTRAVENOUS | Status: DC
  Filled 2019-06-24: qty 100

## 2019-06-24 MED ORDER — CHLORHEXIDINE GLUCONATE CLOTH 2 % EX PADS: 6.0000 | MEDICATED_PAD | Freq: Once | CUTANEOUS | Status: DC

## 2019-06-24 MED ORDER — MIDAZOLAM HCL 2 MG/2ML IJ SOLN
INTRAMUSCULAR | Status: DC | PRN
  Administered 2019-06-24 (×2): 1 mg via INTRAVENOUS

## 2019-06-24 MED ORDER — EPINEPHRINE HCL 5 MG/250ML IV SOLN IN NS
0.0000 ug/min | INTRAVENOUS | Status: AC
  Administered 2019-06-25: 3 ug/min via INTRAVENOUS
  Filled 2019-06-24: qty 250

## 2019-06-24 MED ORDER — LABETALOL HCL 5 MG/ML IV SOLN: 10.0000 mg | INTRAVENOUS | Status: AC | PRN

## 2019-06-24 MED ORDER — IOHEXOL 350 MG/ML SOLN
INTRAVENOUS | Status: DC | PRN
  Administered 2019-06-24: 10 mL

## 2019-06-24 MED ORDER — ONDANSETRON HCL 4 MG/2ML IJ SOLN: 4.0000 mg | Freq: Four times a day (QID) | INTRAMUSCULAR | Status: DC | PRN

## 2019-06-24 MED ORDER — FLUCONAZOLE IN SODIUM CHLORIDE 400-0.9 MG/200ML-% IV SOLN
400.0000 mg | INTRAVENOUS | Status: DC
  Filled 2019-06-24: qty 200

## 2019-06-24 MED ORDER — SODIUM CHLORIDE 0.9% FLUSH
3.0000 mL | Freq: Two times a day (BID) | INTRAVENOUS | Status: DC
  Administered 2019-06-24: 3 mL via INTRAVENOUS

## 2019-06-24 MED ORDER — TRANEXAMIC ACID 1000 MG/10ML IV SOLN
1.5000 mg/kg/h | INTRAVENOUS | Status: AC
  Administered 2019-06-25: 1.5 mg/kg/h via INTRAVENOUS
  Filled 2019-06-24: qty 25

## 2019-06-24 MED ORDER — TRANEXAMIC ACID 1000 MG/10ML IV SOLN
1.5000 mg/kg/h | INTRAVENOUS | Status: DC
  Filled 2019-06-24 (×2): qty 25

## 2019-06-24 MED ORDER — EPINEPHRINE HCL 5 MG/250ML IV SOLN IN NS
0.0000 ug/min | INTRAVENOUS | Status: DC
  Filled 2019-06-24: qty 250

## 2019-06-24 MED ORDER — HEPARIN (PORCINE) IN NACL 1000-0.9 UT/500ML-% IV SOLN
INTRAVENOUS | Status: DC | PRN
  Administered 2019-06-24: 500 mL

## 2019-06-24 MED ORDER — SODIUM CHLORIDE 0.9 % IV SOLN
600.0000 mg | INTRAVENOUS | Status: DC
  Filled 2019-06-24: qty 600

## 2019-06-24 MED ORDER — VANCOMYCIN HCL 1000 MG IV SOLR
1000.0000 mg | INTRAVENOUS | Status: DC
  Filled 2019-06-24: qty 1000

## 2019-06-24 MED ORDER — SODIUM CHLORIDE 0.9 % IV SOLN
600.0000 mg | INTRAVENOUS | Status: AC
  Administered 2019-06-25: 600 mg via INTRAVENOUS
  Filled 2019-06-24: qty 600

## 2019-06-24 MED ORDER — NOREPINEPHRINE 4 MG/250ML-% IV SOLN
2.0000 ug/min | INTRAVENOUS | Status: DC
  Administered 2019-06-24: 11 ug/min via INTRAVENOUS
  Administered 2019-06-25: 7 ug/min via INTRAVENOUS
  Administered 2019-06-25: 9 ug/min via INTRAVENOUS
  Filled 2019-06-24 (×2): qty 250

## 2019-06-24 MED ORDER — TRANEXAMIC ACID (OHS) BOLUS VIA INFUSION
15.0000 mg/kg | INTRAVENOUS | Status: AC
  Administered 2019-06-25: 1386 mg via INTRAVENOUS
  Filled 2019-06-24: qty 1386

## 2019-06-24 MED ORDER — HEPARIN (PORCINE) IN NACL 1000-0.9 UT/500ML-% IV SOLN
INTRAVENOUS | Status: AC
  Filled 2019-06-24: qty 1000

## 2019-06-24 MED ORDER — SODIUM CHLORIDE 0.9 % IV SOLN: 250.0000 mL | INTRAVENOUS | Status: DC | PRN

## 2019-06-24 MED ORDER — SODIUM CHLORIDE 0.9% IV SOLUTION: INTRAVENOUS | Status: DC

## 2019-06-24 MED ORDER — PHENYLEPHRINE HCL-NACL 20-0.9 MG/250ML-% IV SOLN
0.0000 ug/min | INTRAVENOUS | Status: DC
  Filled 2019-06-24: qty 250

## 2019-06-24 MED ORDER — VASOPRESSIN 20 UNIT/ML IV SOLN
0.0400 [IU]/min | INTRAVENOUS | Status: AC
  Administered 2019-06-25: .03 [IU]/min via INTRAVENOUS
  Filled 2019-06-24: qty 2

## 2019-06-24 MED ORDER — LIDOCAINE HCL (PF) 1 % IJ SOLN
INTRAMUSCULAR | Status: DC | PRN
  Administered 2019-06-24: 5 mL
  Administered 2019-06-24: 15 mL

## 2019-06-24 MED ORDER — SODIUM CHLORIDE 0.9 % IV SOLN
750.0000 mg | INTRAVENOUS | Status: DC
  Filled 2019-06-24: qty 750

## 2019-06-24 MED ORDER — POTASSIUM CHLORIDE 2 MEQ/ML IV SOLN
80.0000 meq | INTRAVENOUS | Status: DC
  Filled 2019-06-24: qty 40

## 2019-06-24 MED ORDER — HEPARIN SODIUM (PORCINE) 1000 UNIT/ML IJ SOLN
INTRAMUSCULAR | Status: AC
  Filled 2019-06-24: qty 1

## 2019-06-24 MED ORDER — SODIUM CHLORIDE 0.9% FLUSH: 3.0000 mL | Freq: Two times a day (BID) | INTRAVENOUS | Status: DC

## 2019-06-24 MED ORDER — PHENYLEPHRINE HCL-NACL 20-0.9 MG/250ML-% IV SOLN
0.0000 ug/min | INTRAVENOUS | Status: AC
  Administered 2019-06-25: 40 ug/min via INTRAVENOUS
  Filled 2019-06-24: qty 250

## 2019-06-24 MED ORDER — TRANEXAMIC ACID (OHS) PUMP PRIME SOLUTION
2.0000 mg/kg | INTRAVENOUS | Status: DC
  Filled 2019-06-24: qty 1.85

## 2019-06-24 MED ORDER — HEPARIN (PORCINE) 25000 UT/250ML-% IV SOLN
1350.0000 [IU]/h | INTRAVENOUS | Status: DC
  Administered 2019-06-24: 1200 [IU]/h via INTRAVENOUS
  Filled 2019-06-24: qty 250

## 2019-06-24 MED ORDER — FLUCONAZOLE IN SODIUM CHLORIDE 400-0.9 MG/200ML-% IV SOLN
400.0000 mg | INTRAVENOUS | Status: AC
  Administered 2019-06-25: 400 mg via INTRAVENOUS
  Filled 2019-06-24: qty 200

## 2019-06-24 MED ORDER — FENTANYL CITRATE (PF) 100 MCG/2ML IJ SOLN
INTRAMUSCULAR | Status: DC | PRN
  Administered 2019-06-24 (×2): 25 ug via INTRAVENOUS

## 2019-06-24 MED ORDER — SODIUM CHLORIDE 0.9 % IV SOLN
750.0000 mg | INTRAVENOUS | Status: AC
  Administered 2019-06-25: 750 mg via INTRAVENOUS
  Filled 2019-06-24: qty 750

## 2019-06-24 MED ORDER — HEPARIN SODIUM (PORCINE) 1000 UNIT/ML IJ SOLN
INTRAMUSCULAR | Status: DC | PRN
  Administered 2019-06-24: 5000 [IU] via INTRAVENOUS

## 2019-06-24 MED ORDER — TRANEXAMIC ACID (OHS) BOLUS VIA INFUSION
15.0000 mg/kg | INTRAVENOUS | Status: DC
  Filled 2019-06-24: qty 1386

## 2019-06-24 MED ORDER — TEMAZEPAM 15 MG PO CAPS: 15.0000 mg | ORAL_CAPSULE | Freq: Once | ORAL | Status: DC | PRN

## 2019-06-24 MED ORDER — INSULIN REGULAR(HUMAN) IN NACL 100-0.9 UT/100ML-% IV SOLN
INTRAVENOUS | Status: AC
  Administered 2019-06-25: 1.4 [IU]/h via INTRAVENOUS
  Filled 2019-06-24: qty 100

## 2019-06-24 MED ORDER — ASPIRIN 81 MG PO CHEW: 81.0000 mg | CHEWABLE_TABLET | ORAL | Status: DC

## 2019-06-24 MED ORDER — MUPIROCIN 2 % EX OINT: TOPICAL_OINTMENT | Freq: Two times a day (BID) | CUTANEOUS | Status: DC

## 2019-06-24 MED ORDER — MIDAZOLAM HCL 2 MG/2ML IJ SOLN
INTRAMUSCULAR | Status: AC
  Filled 2019-06-24: qty 2

## 2019-06-24 MED ORDER — SODIUM CHLORIDE 0.9 % IV SOLN
1.5000 g | INTRAVENOUS | Status: AC
  Administered 2019-06-25: 1.5 g via INTRAVENOUS
  Filled 2019-06-24: qty 1.5

## 2019-06-24 MED ORDER — DEXMEDETOMIDINE HCL IN NACL 400 MCG/100ML IV SOLN
0.1000 ug/kg/h | INTRAVENOUS | Status: DC
  Filled 2019-06-24: qty 100

## 2019-06-24 MED ORDER — METOPROLOL TARTRATE 12.5 MG HALF TABLET
12.5000 mg | ORAL_TABLET | Freq: Once | ORAL | Status: DC
Start: 2019-06-25 — End: 2019-06-24

## 2019-06-24 MED ORDER — LIDOCAINE HCL (PF) 1 % IJ SOLN
INTRAMUSCULAR | Status: AC
  Filled 2019-06-24: qty 30

## 2019-06-24 MED ORDER — SODIUM CHLORIDE 0.9 % IV SOLN
INTRAVENOUS | Status: DC
  Filled 2019-06-24: qty 30

## 2019-06-24 MED ORDER — NOREPINEPHRINE 4 MG/250ML-% IV SOLN
INTRAVENOUS | Status: AC
  Filled 2019-06-24: qty 250

## 2019-06-24 MED ORDER — CHLORHEXIDINE GLUCONATE 0.12 % MT SOLN
15.0000 mL | Freq: Once | OROMUCOSAL | Status: DC
  Filled 2019-06-24: qty 15

## 2019-06-24 MED ORDER — VANCOMYCIN HCL 1500 MG/300ML IV SOLN
1500.0000 mg | INTRAVENOUS | Status: DC
  Filled 2019-06-24: qty 300

## 2019-06-24 MED ORDER — VASOPRESSIN 20 UNIT/ML IV SOLN
0.0400 [IU]/min | INTRAVENOUS | Status: DC
  Filled 2019-06-24: qty 2

## 2019-06-24 MED ORDER — BISACODYL 5 MG PO TBEC: 5.0000 mg | DELAYED_RELEASE_TABLET | Freq: Once | ORAL | Status: DC

## 2019-06-24 MED ORDER — ACETAMINOPHEN 325 MG PO TABS
650.0000 mg | ORAL_TABLET | ORAL | Status: DC | PRN
  Administered 2019-06-24 – 2019-06-25 (×2): 650 mg via ORAL
  Filled 2019-06-24 (×2): qty 2

## 2019-06-24 SURGICAL SUPPLY — 14 items
BALLN IABP SENSA PLUS 8F 50CC (BALLOONS) ×2
BALLOON IABP SENS PLUS 8F 50CC (BALLOONS) IMPLANT
CATH SWAN GANZ VIP 7.5F (CATHETERS) ×1 IMPLANT
HOVERMATT SINGLE USE (MISCELLANEOUS) ×1 IMPLANT
KIT HEART LEFT (KITS) ×2 IMPLANT
KIT MICROPUNCTURE NIT STIFF (SHEATH) ×1 IMPLANT
PACK CARDIAC CATHETERIZATION (CUSTOM PROCEDURE TRAY) ×2 IMPLANT
SHEATH PINNACLE 5F 10CM (SHEATH) ×1 IMPLANT
SHEATH PINNACLE 8F 10CM (SHEATH) ×1 IMPLANT
SHEATH PROBE COVER 6X72 (BAG) ×1 IMPLANT
SLEEVE REPOSITIONING LENGTH 30 (MISCELLANEOUS) ×1 IMPLANT
TRANSDUCER W/STOPCOCK (MISCELLANEOUS) ×2 IMPLANT
TUBING ART PRESS 72  MALE/FEM (TUBING) ×1
TUBING ART PRESS 72 MALE/FEM (TUBING) IMPLANT

## 2019-06-24 NOTE — Progress Notes (Signed)
RT placed pt on CPAP dream station for the night on auto titrate with settings of max 12 min 6 w/4 Lpm bled into the system. Pt respiratory status is stable on CPAP at this time. RT will continue to monitor.

## 2019-06-24 NOTE — Progress Notes (Signed)
Chaplain responded to page from unit SW requesting assistance with AD after patient returned from Cath Lab around 3:30. Chaplain initiated visit w/ an introduction and inquired about AD.  Patient and sister were in the room and expressed how much comfort patient would feel if the AD could be completed.  Chaplain attempted to procure volunteers.  They were not available prior to leaving for the day at 4pm. Patient and sister expressed concern over not being able to have the AD notarized prior to patient's surgery at 6:30am tomorrow.   Patient explained his designation for his HCPOA would be his sister, [name] who was present at bedside.  Chaplain explained Living Will and patient acknowledged understanding the content.  It is the patient's wish, in the event he is unable to speak for himself. his sister would be the person he elects to speak for him.  She too acknowledged a complete understanding of the Living Will and the role she would play for her brother if the time ever comes he is unable to speak for himself about medical issues related to his life.  Chaplain assured patient his wishes would be entered into his chart so staff would know his wishes and his sister is his elected HCPOA.  Patient exhibited relief this would be a matter of record.  Chaplain has provided a copy of these notes to the patient and his sister.  Sister:  Michaelle Birks  [phone].  Office of [place] is available for continued support as requested.   De Burrs Chaplain Resident

## 2019-06-24 NOTE — Interval H&P Note (Signed)
History and Physical Interval Note:  06/24/2019 1:05 PM  Aaron [name]  has presented today for surgery, with the diagnosis of cardiogenic shock.  The various methods of treatment have been discussed with the patient and family. After consideration of risks, benefits and other options for treatment, the patient has consented to  Procedure(s): RIGHT HEART CATH (N/A) IABP INSERTION (N/A) as a surgical intervention.  The patient's history has been reviewed, patient examined, no change in status, stable for surgery.  I have reviewed the patient's chart and labs.  Questions were answered to the patient's satisfaction.     [name]

## 2019-06-24 NOTE — Progress Notes (Signed)
Orthopedic Tech Progress Note Patient Details:  Aaron [name] [birth year] [id number] Patient has KNEE IMMOBILIZER  Patient ID: Aaron [name], male   DOB: 1965/11/05, 54 y.o.   MRN: [id number]   Aaron [name] 06/24/2019, 4:46 PM

## 2019-06-24 NOTE — Progress Notes (Signed)
[facility] for Heparin  Indication: chest pain/ACS, multi-vessel CAD>>new IABP in place  Allergies  Allergen Reactions  . Codeine Hives    Patient Measurements: Height: 5\' 9"  (175.3 cm) Weight: 92.4 kg (203 lb 11.3 oz) IBW/kg (Calculated) : 70.7  Vital Signs: Temp: 97.8 F (36.6 C) (06/23 1149) Temp Source: Oral (06/23 0400) BP: 109/69 (06/23 1418) Pulse Rate: 71 (06/23 1418)  Labs: Recent Labs    06/22/19 0403 06/22/19 1159 06/22/19 1227 06/23/19 0457 06/23/19 0457 06/23/19 1357 06/23/19 1357 06/24/19 0448 06/24/19 1350  HGB 14.1  --    < > 14.0   < > 14.6   < > 15.4 15.6  HCT 41.9  --    < > 41.4   < > 43.0  --  45.0 46.0  PLT 117*  --   --  136*  --   --   --  163  --   APTT  --  31  --   --   --   --   --   --   --   LABPROT  --  14.2  --   --   --   --   --   --   --   INR  --  1.1  --   --   --   --   --   --   --   CREATININE 1.26*  --   --  1.13  --   --   --  1.23  --    < > = values in this interval not displayed.    Estimated Creatinine Clearance: 76.2 mL/min (by C-G formula based on SCr of 1.23 mg/dL).   Assessment: 54 y/o M transfer from [facility] for CABG. Pt presented to [facility] as CODE STEMI and underwent cath which showed multi-vessel CAD not amendable to PCI.  IABP was replaced in the cath lab this afternoon. Plan is for VAD implant in am. CBC stable this am. Orders to start IV heparin this afternoon.   Goal of Therapy:  Heparin level 0.2-0.5 units/ml with IABP in place Monitor platelets by anticoagulation protocol: Yes   Plan:  Restart IV heparin gtt at 1200 units/hr Check 6h heparin level LVAD implant in am  Erin Hearing PharmD., BCPS Clinical Pharmacist 06/24/2019 3:08 PM

## 2019-06-24 NOTE — Telephone Encounter (Signed)
CSW contacted patient's sister to confirm contact information for tomorrow. VAD Coordinator will call sister to update from the OR. Sister will be present this afternoon for completion of Living Will with Chaplain. Sister verbalizes understanding of plan for surgery day. CSW continues to follow. Raquel Sarna, [place], [place]

## 2019-06-24 NOTE — Progress Notes (Signed)
Aaron [name] has been discussed with the VAD Medical Review board on 06/24/19. The team feels as if the patient is a good candidate for Destination LVAD therapy. The patient meets criteria for a LVAD implant as listed below:  1)  Has failed to respond to optimal medical management (including beta-blockers and ACE inhibitors if tolerated) for at least 45 of the last 60 days, or have been balloon dependent for 7 days, or IV inotrope dependent for 14 days; and,       *On Inotropes Milrinone started 06/22/19    2)  Has a left ventricular ejection fraction (LVEF) < 25% and, have demonstrated functional limitation with a peak oxygen consumption of <14 ml/kg/min unless balloon pump or inotrope dependent or physically unable to perform the test.         *EF 20 - 25% by echo on 06/18/19.            *CPX not done; pt on Milrinone  3)  Social work and palliative care evaluations demonstrate appropriate support system in place for discharge to home with a VAD and that end of life discussions have taken place. Both services have expressed no concern regarding patient's candidacy.         *Social work consult 06/23/19: Raquel Sarna, LCSW        *Palliative Care Consult 06/23/19: Wadie Lessen, NP  4)  Primary caretaker identified that can be taught along with the patient how to manage        the VAD equipment.        *Name:  Tristan Schroeder, [place]   5)  Deemed appropriate by our financial coordinator: Cecilio Asper        Prior approval: specific VAD prior authorization not required by [facility] Transplant Nurse on 06/23/19;             "LVAD approval covered under hospitalization".  6)  VAD Coordinator, Zada Girt has met with patient and caregiver, shown them the VAD equipment and discussed with the patient and caregiver about lifestyle changes necessary for success on mechanical circulatory device.        *Met with patient and sister on 06/22/19       *Consent for VAD Evaluation/Caregiver Agreement/HIPPA  Release/Photo Release signed on 06/22/19  7)  Dr. Haroldine Laws as discussed patient with [facility] (Dr. Mosetta Pigeon) and felt to be an appropriate candidate for Destination Therapy LVAD due to smoking history.   8)  Intermacs profile: 2  INTERMACS 1: Critical cardiogenic shock describes a patient who is "crashing and burning", in which a patient has life-threatening hypotension and rapidly escalating inotropic pressor support, with critical organ hypoperfusion often confirmed by worsening acidosis and lactate levels.  INTERMACS 2: Progressive decline describes a patient who has been demonstrated "dependent" on inotropic support but nonetheless shows signs of continuing deterioration in nutrition, renal function, fluid retention, or other major status indicator. Patient profile 2 can also describe a patient with refractory volume overload, perhaps with evidence of impaired perfusion, in whom inotropic infusions cannot be maintained due to tachyarrhythmias, clinical ischemia, or other intolerance.  INTERMACS 3: Stable but inotrope dependent describes a patient who is clinically stable on mild-moderate doses of intravenous inotropes (or has a temporary circulatory support device) after repeated documentation of failure to wean without symptomatic hypotension, worsening symptoms, or progressive organ dysfunction (usually renal). It is critical to monitor nutrition, renal function, fluid balance, and overall status carefully in order to distinguish between a   patient who is  truly stable at Patient Profile 3 and a patient who has unappreciated decline rendering them Patient Profile 2. This patient may be either at home or in the hospital.      INTERMACS 4: Resting symptoms describes a patient who is at home on oral therapy but frequently has symptoms of congestion at rest or with activities of daily living (ADL). He or she may have orthopnea, shortness of breath during ADL such as dressing or bathing,  gastrointestinal symptoms (abdominal discomfort, nausea, poor appetite), disabling ascites or severe lower extremity edema. This patient should be carefully considered for more intensive management and surveillance programs, which may in some cases, reveal poor compliance that would compromise outcomes with any therapy.   .   INTERMACS 5: Exertion Intolerant describes a patient who is comfortable at rest but unable to engage in any activity, living predominantly within the house or housebound. This patient has no congestive symptoms, but may have chronically elevated volume status, frequently with renal dysfunction, and may be characterized as exercise intolerant.      INTERMACS 6: Exertion Limited also describes a patient who is comfortable at rest without evidence of fluid overload, but who is able to do some mild activity. Activities of daily living are comfortable and minor activities outside the home such as visiting friends or going to a restaurant can be performed, but fatigue results within a few minutes of any meaningful physical exertion. This patient has occasional episodes of worsening symptoms and is likely to have had a hospitalization for heart failure within the past year.   INTERMACS 7: Advanced NYHA Class 3 describes a patient who is clinically stable with a reasonable level of comfortable activity, despite history of previous decompensation that is not recent. This patient is usually able to walk more than a block. Any decompensation requiring intravenous diuretics or hospitalization within the previous month should make this person a Patient Profile 6 or lower.     9)  NYHA Class: IV  Zada Girt RN, [place] Coordinator [phone]

## 2019-06-24 NOTE — Progress Notes (Addendum)
Advanced Heart Failure Rounding Note  PCP-Cardiologist: No primary care provider on file.   Subjective:    IABP removed 6/18.   Co-ox 57% on milrinone 0.125 mcg. Continues on amio 30 mg per hour.  CVP 13. I/Os not accurate. Only 275 cc charted yesterday. He confirms that he did not use designated urinal much yesterday.   Frequent apnea observed overnight.   Feels ok. Denies CP. Continues w/ orthopnea.      Objective:   Weight Range: 92.4 kg Body mass index is 30.08 kg/m.   Vital Signs:   Temp:  [97.6 F (36.4 C)-98.9 F (37.2 C)] 97.6 F (36.4 C) (06/23 0737) Pulse Rate:  [75-90] 78 (06/23 0700) Resp:  [12-34] 21 (06/23 0700) BP: (78-119)/(57-86) 101/78 (06/23 0700) SpO2:  [93 %-99 %] 93 % (06/23 0700) Last BM Date: 06/23/19  Weight change: Filed Weights   06/21/19 0628 06/22/19 0359 06/23/19 0500  Weight: 95.4 kg 96 kg 92.4 kg    Intake/Output:   Intake/Output Summary (Last 24 hours) at 06/24/2019 0752 Last data filed at 06/24/2019 0700 Gross per 24 hour  Intake 483.44 ml  Output 275 ml  Net 208.44 ml      Physical Exam  CVP 13  General: fatigue appearing.  No resp difficulty HEENT: normal Neck: supple. JVP elevated to ear . Carotids 2+ bilat; no bruits. No lymphadenopathy or thryomegaly appreciated. Cor: PMI nondisplaced. Regular rate & rhythm. No rubs, or murmurs. +S3  Lungs: clear Abdomen: soft, nontender, nondistended. No hepatosplenomegaly. No bruits or masses. Good bowel sounds. Extremities: no cyanosis, clubbing, rash, edema + RUE PICC Neuro: alert & orientedx3, cranial nerves grossly intact. moves all 4 extremities w/o difficulty. Affect pleasant  Telemetry   NSR w/ PVCs    Labs    CBC Recent Labs    06/23/19 0457 06/23/19 0457 06/23/19 1357 06/24/19 0448  WBC 8.8  --   --  8.8  HGB 14.0   < > 14.6 15.4  HCT 41.4   < > 43.0 45.0  MCV 97.2  --   --  97.4  PLT 136*  --   --  163   < > = values in this interval not displayed.    Basic Metabolic Panel Recent Labs    06/23/19 0457 06/23/19 0457 06/23/19 1357 06/24/19 0448  NA 131*   < > 131* 131*  K 3.2*   < > 4.0 3.9  CL 96*  --   --  96*  CO2 23  --   --  24  GLUCOSE 161*  --   --  129*  BUN 24*  --   --  20  CREATININE 1.13  --   --  1.23  CALCIUM 8.0*  --   --  8.5*   < > = values in this interval not displayed.   Liver Function Tests Recent Labs    06/23/19 1430  AST 123*  ALT 125*  ALKPHOS 57  BILITOT 0.9  PROT 6.1*  ALBUMIN 2.5*   No results for input(s): LIPASE, AMYLASE in the last 72 hours. Cardiac Enzymes No results for input(s): CKTOTAL, CKMB, CKMBINDEX, TROPONINI in the last 72 hours.  BNP: BNP (last 3 results) Recent Labs    06/17/19 2023 06/18/19 0119  BNP 751.1* 1,068.5*    ProBNP (last 3 results) No results for input(s): PROBNP in the last 8760 hours.   D-Dimer No results for input(s): DDIMER in the last 72 hours. Hemoglobin A1C Recent Labs  06/22/19 1159  HGBA1C 5.4   Fasting Lipid Panel No results for input(s): CHOL, HDL, LDLCALC, TRIG, CHOLHDL, LDLDIRECT in the last 72 hours. Thyroid Function Tests No results for input(s): TSH, T4TOTAL, T3FREE, THYROIDAB in the last 72 hours.  Invalid input(s): FREET3  Other results:   Imaging    VAS US DOPPLER PRE VAD  Result Date: 06/23/2019 PERIOPERATIVE VASCULAR EVALUATION Indications:      Pre-VAD evaluation. Comparison Study: No prior study Performing Technologist: Maudry Mayhew MHA, RVT, RDCS, RDMS  Examination Guidelines: A complete evaluation includes B-mode imaging, spectral Doppler, color Doppler, and power Doppler as needed of all accessible portions of each vessel. Bilateral testing is considered an integral part of a complete examination. Limited examinations for reoccurring indications may be performed as noted.  Left Carotid Findings: +----------+--------+--------+--------+------------+ SubclavianPSV cm/sEDV cm/sDescribeArm Pressure  +----------+--------+--------+--------+------------+                                   85           +----------+--------+--------+--------+------------+  ABI Findings: +--------+------------------+-----+---------+----------------------------------+ Right   Rt Pressure (mmHg)IndexWaveform Comment                            +--------+------------------+-----+---------+----------------------------------+ Brachial                       triphasicUnable to obtain pressure due to                                           PICC line                          +--------+------------------+-----+---------+----------------------------------+ PTA     96                1.13 triphasic                                   +--------+------------------+-----+---------+----------------------------------+ DP      87                1.02 triphasic                                   +--------+------------------+-----+---------+----------------------------------+ +--------+------------------+-----+---------+-------+ Left    Lt Pressure (mmHg)IndexWaveform Comment +--------+------------------+-----+---------+-------+ Brachial85                     triphasic        +--------+------------------+-----+---------+-------+ PTA     103               1.21 triphasic        +--------+------------------+-----+---------+-------+ DP      72                0.85 triphasic        +--------+------------------+-----+---------+-------+ +-------+---------------+----------------+ ABI/TBIToday's ABI/TBIPrevious ABI/TBI +-------+---------------+----------------+ Right  1.13                            +-------+---------------+----------------+ Left   1.21                            +-------+---------------+----------------+  Right ABI: Resting right ankle-brachial index is within normal range. No evidence of significant right lower extremity arterial disease. Left ABI: Resting left ankle-brachial  index is within normal range. No evidence of significant left lower extremity arterial disease.  Electronically signed by Servando Snare MD on 06/23/2019 at 5:15:44 PM.   Final      Medications:     Scheduled Medications: . aspirin  81 mg Oral Daily  . atorvastatin  80 mg Oral Daily  . Chlorhexidine Gluconate Cloth  6 each Topical Daily  . clonazePAM  0.5 mg Oral BID  . digoxin  0.125 mg Oral Daily  . enoxaparin (LOVENOX) injection  40 mg Subcutaneous Q24H  . feeding supplement (ENSURE ENLIVE)  237 mL Oral BID BM  . folic acid  1 mg Oral Daily  . furosemide  80 mg Intravenous BID  . mouth rinse  15 mL Mouth Rinse BID  . multivitamin with minerals  1 tablet Oral Daily  . sacubitril-valsartan  1 tablet Oral BID  . sodium chloride flush  10-40 mL Intracatheter Q12H  . sodium chloride flush  3 mL Intravenous Q12H  . spironolactone  25 mg Oral Daily  . thiamine  100 mg Oral Daily    Infusions: . sodium chloride Stopped (06/19/19 1443)  . sodium chloride Stopped (06/18/19 0953)  . sodium chloride Stopped (06/19/19 1446)  . amiodarone 30 mg/hr (06/24/19 0700)  . milrinone 0.125 mcg/kg/min (06/24/19 0700)    PRN Medications: sodium chloride, sodium chloride, sodium chloride, acetaminophen, ALPRAZolam, fentaNYL (SUBLIMAZE) injection, morphine injection, nitroGLYCERIN, ondansetron (ZOFRAN) IV, pneumococcal 23 valent vaccine, sodium chloride flush, sodium chloride flush, traZODone    Patient Profile   Aaron [name] --post MI cardiogenic shock/ optimization prior to anticipated CABG, at the request of Dr. Marlou Porch, Cardiology.    Assessment/Plan   1. Acute Anterolateral STEMI/ Multivessel CAD - Emergent cath showed severe multivessel disease w/ 75% mid- distal LM, 100% prox LAD, not amendable to PCI (unable to cross w/ guide wire, probable CTO), 100% prox LCx (probable CTO) and 100% ostial RCA (probable CTO). There were mild to moderate collaterals right to left to the LAD, minimal  right to right collaterals an minimal left to left collaterals. LVEF 25% - Hs trop peaked to 17,602>27,000. - No targets for CABG / PCI.  VAD workup pending.  - IABP out - No chest pain.  - Continue ASA 81 mg - Continue Atorvastatin 80. - no ? blocker w/ shock    2.  Acute Systolic Heart Failure --->Cardiogenic Shock - 2/2 acute MI. LVEF by LVG 25% - IABP out 6/18 - On milrinone 0.125 mcg. CO-OX 57%  - Continue entresto 24-26 mg twice a day - Continue spiro  25 mg daily.  - CVP 13.  - Continue IV lasix 80 mg bid. K low will supp.  - Continue dig, dig level 0.3.  - No b-blocker yet with shock - Case reviewed with TCTS and IC team. No targets for CABG or PCI. He has minimal coronary circulation remaining. Transplant off the table with tobacco use.   - Will need durable VAD this admit. Scheduled for 6/24. He remains tenuous. Will put IABP back in + Rt IJ Swan for stabilization prior to VAD.   3. NSVT - improved - keep K> 4.0 Mg >2.0  - continue amio - Supp K - low threshold to put IABP back  4. Hypertension  - reported h/o untreated HTN prior to admit - Controlled on current regimen  5. Tobacco Abuse - 30 pack years  - smoking cessation advised  - Will need to quit if to be considered for heart transplant in the future   6. Elevated LFTs due to shock liver - improved  7. ETOH  - Drinks 2 mixed drinks a night + more on the weekend.    8. Renal mass - seen on CT -  Concern for RCC.  - MRI abdomen- 1.8 cm right renal lesion, most consistent with renal cell carcinoma. No evidence of renal vein involvement or nodal metastasis. - Discussed w/ Urology and R RCC is small and will just require surveillance. - ok to proceed with VAD from their standpoint.   9. Thrombocytopenia - PLTs on admit 172   -Down to 92>117 >136>163 - On lovenox  10. Hyponatremia  - Sodium 131  11. Sleep Apnea - frequent apnea at night  - needs formal sleep study   - order CPAP while  inpatient   Length of Stay: [address] PA-C  06/24/2019, 7:52 AM  Advanced Heart Failure Team Pager [phone] (M-F; 7a - 4p)  Please contact [facility] for night-coverage after hours (4p -7a ) and weekends on [email or web address]  Agree with above.   Remains quite tenuous. Co-ox marginal. CVP up. Having some apnea overnight. Plan for VAD tomorrow  General:   Sitting up in bed. No resp difficulty HEENT: normal Neck: supple. no JVD. Carotids 2+ bilat; no bruits. No lymphadenopathy or thryomegaly appreciated. Cor: PMI nondisplaced. Regular rate & rhythm. + PVCs No rubs, gallops or murmurs. Lungs: clear Abdomen: soft, nontender, nondistended. No hepatosplenomegaly. No bruits or masses. Good bowel sounds. Extremities: no cyanosis, clubbing, rash, edema Neuro: alert & orientedx3, cranial nerves grossly intact. moves all 4 extremities w/o difficulty. Affect pleasant  For VAD tomorrow. He is currently not optimized. D/w TCTS. Will take to cath lab and place IABP and Swan to stabilize/optimize for VAD implant tomorrow.   CRITICAL CARE Performed by: Glori Bickers  Total critical care time: 35 minutes  Critical care time was exclusive of separately billable procedures and treating other patients.  Critical care was necessary to treat or prevent imminent or life-threatening deterioration.  Critical care was time spent personally by me (independent of midlevel providers or residents) on the following activities: development of treatment plan with patient and/or surrogate as well as nursing, discussions with consultants, evaluation of patient's response to treatment, examination of patient, obtaining history from patient or surrogate, ordering and performing treatments and interventions, ordering and review of laboratory studies, ordering and review of radiographic studies, pulse oximetry and re-evaluation of patient's condition.  Glori Bickers, MD  10:58 AM

## 2019-06-24 NOTE — Progress Notes (Signed)
[place] for Heparin  Indication: chest pain/ACS, multi-vessel CAD>>new IABP in place  Allergies  Allergen Reactions  . Codeine Hives    Patient Measurements: Height: 5\' 9"  (175.3 cm) Weight: 92.4 kg (203 lb 11.3 oz) IBW/kg (Calculated) : 70.7  Vital Signs: Temp: 100.8 F (38.2 C) (06/23 2115) Temp Source: Oral (06/23 2000) BP: 122/85 (06/23 2100) Pulse Rate: 65 (06/23 2115)  Labs: Recent Labs    06/22/19 0403 06/22/19 1159 06/22/19 1227 06/23/19 0457 06/23/19 1357 06/24/19 0448 06/24/19 0448 06/24/19 1350 06/24/19 1357 06/24/19 2143  HGB 14.1  --    < > 14.0   < > 15.4   < > 15.3  15.6 12.6*  --   HCT 41.9  --    < > 41.4   < > 45.0  --  45.0  46.0 37.0*  --   PLT 117*  --   --  136*  --  163  --   --   --   --   APTT  --  31  --   --   --   --   --   --   --   --   LABPROT  --  14.2  --   --   --   --   --   --   --   --   INR  --  1.1  --   --   --   --   --   --   --   --   HEPARINUNFRC  --   --   --   --   --   --   --   --   --  0.16*  CREATININE 1.26*  --   --  1.13  --  1.23  --   --   --   --    < > = values in this interval not displayed.    Estimated Creatinine Clearance: 76.2 mL/min (by C-G formula based on SCr of 1.23 mg/dL).   Assessment: 54 y/o M transfer from [facility] for CABG. Pt presented to [facility] as CODE STEMI and underwent cath which showed multi-vessel CAD not amendable to PCI.  IABP was replaced in the cath lab this afternoon. Plan is for VAD implant in am. Heparin was restarted earlier today  -heparin level = 0.16  Goal of Therapy:  Heparin level 0.2-0.5 units/ml with IABP in place Monitor platelets by anticoagulation protocol: Yes   Plan:  Increase heparin to 1350 units/hr Heparin level in am LVAD implant in am  Hildred Laser, PharmD Clinical Pharmacist **Pharmacist phone directory can now be found on [email or web address] ([email or web address]).  Listed under [place].

## 2019-06-24 NOTE — Progress Notes (Signed)
LVAD Initial Psychosocial Screening  Date Initiated:  June 23, 2019 Referral Source: Tanda Rockers, LVAD Coordinator Referral Reason: LVAD implantation  Source of Information:  Pt, sister Aaron and chart review  Demographics Name:  Aaron [name] Address:  [address] [place] [address] Home phone:  n/a   Cell: [phone] Marital Status:  Divorced (63yrs ago)  Faith:  none Primary Language: English  DOB:  1965/11/10  Medical & Follow-up Adherence to Medical regimen/INR checks: compliant  Medication adherence: compliant  Physician/Clinic Appointment Attendance: compliant Comments: Patient states he was not on any medications or followed by any specialist prior to admission.   Advance Directives: Do you have a Living Will or Medical POA? No  Would you like to complete a Living Will and Medical POA prior to surgery?  Yes CSW referred to Spiritual Care Dept for completion prior to surgery.  Do you have Goals of Care? Yes  Have you had a consult with the Palliative Care Team at [facility]? Yes  Psychological Health Appearance:  In hospital gown Mental Status:  Alert, oriented Eye Contact:  Good Thought Content:  Coherent Speech:  Unremarkable Mood:  Appropriate and Pleasant  Affect:  Appropriate to circumstance Insight:  Good Judgement: Unimpaired Interaction Style:  Engaged  Family/Social Information Who lives in your home? Name:   Relationship:   Patient lives alone with his dog Aaron [name]  Other family members/support persons in your life? Name:   Relationship:   Aaron [name]  Sister Aaron [name]  1/2 brother Aaron [name]   18yo son  Caregiving Needs Who is the primary caregiver? Aaron [name] status:  good Do you drive?  yes Do you work?  PRN Physical Limitations:  none Do you have other care giving responsibilities?  none Contact number: [phone]  Who is the secondary caregiver? Aaron [name] status:  good Do you drive?  yes Do you work?   In between jobs Physical Limitations:  none Do you have other care giving responsibilities?  no Contact number:  Home Environment/Personal Care Do you have reliable phone service? Yes  If so, what is the number?  Spectrum Do you own or rent your home? own Number of steps into the home? 3 How many levels in the home? 1 Assistive devices in the home? none Electrical needs for LVAD (3 prong outlets)? yes Second hand smoke exposure in the home? Patient smoked prior to admission Travel distance from [facility]? 35 minutes   Community Are you active with community agencies/resources/homecare? No Agency Name: n/a Are you active in a church, synagogue, mosque or other faith based community? No Faith based institutions name: n/a What other sources do you have for spiritual support? none Are you active in any clubs or social organizations? none What do you do for fun?  Hobbies?  Interests? "piddle around the house" Work on cars and trucks  Education/Work Information What is the last grade of school you completed?  12th grade Preferred method of learning?  Hands on Do you have any problems with reading or writing?  No Are you currently employed?  Yes  When were you last employed? Last day of work was June 17, 2019  Name of employer? 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 15+ years If you are not working, do you plan to return to work after VAD surgery? Pending If yes, what type of employment do you  hope to find? Are you interested in job training or learning Aaron skills?  No Did you serve in the [employer]? Yes  If so, what branch? [employer] 4 years  Financial Information What is your source of income? Employment although applying for short term disability through employer Do you have difficulty meeting your monthly expenses? No Can you budget for the monthly cost for dressing supplies post  procedure? Yes  Primary Health insurance:  BC/BS Secondary Insurance: Prescription plan: BC/BS   What are your prescription co-pays? varies Do you use mail order for your prescriptions?  No Have you ever had to refuse medication due to cost?  No Have you applied for Medicaid? No   Have you applied for Social Security Disability (SSI)  No  Medical Information Briefly describe why you are here for evaluation: Patient was admitted last week and diagnosed with HF and requiring VAD implant. No prior history Do you have a PCP or other medical provider? Patient, No Pcp Per Are you able to complete your ADL's? yes Do you have a history of trauma, physical, emotional, or sexual abuse? No Do you have any family history of heart problems? Father Do you smoke now or past usage? Yes  30+ years 1.5 packs a day (last smoked on admission day) Do you drink alcohol now or past usage? Yes  2 drinks a night of burboun   Are you currently using illegal drugs or misuse of medication or past usage? past usage  Have you ever been treated for substance abuse? No      If yes, where and when did you receive treatment? n/a  Mental Health History How have you been feeling in the past year? Good days and Bad days Have you ever had any problems with depression, anxiety or other mental health issues? "Feeling antsy and jittery since admission"  Do you see a counselor, psychiatrist or therapist?  No If you are currently experiencing problems are you interested in talking with a professional? No Have you or are you taking medications for anxiety/depression or any mental health concerns?  No prior to admission but has been prescribed since admission (see med list) What are your coping strategies under stressful situations? "pretty patient" Are there any other stressors in your life? no Have you had any past or current thoughts of suicide? no How many hours do you sleep at night? 3+ hrs How is your appetite? "don't eat  breakfast and late dinners" Would you be interested in attending the LVAD support group? Yes  PHQ2 Depression Scale: 1  Legal Do you currently have any legal issues/problems?  No Have you had any legal issues/problems in the past?   No     Plan for VAD Implementation Do you know and understand what happens during the VAD surgery? Patient Verbalizes Understanding  of surgery and able to describe details What do you know about the risks and side effect associated with VAD surgery? Patient Verbalizes Understanding  of risks (infection, stroke and death) Explain what will happen right after surgery: Patient Verbalizes Understanding  of OR to ICU and will be intubated What is your plan for transportation for the first 8 weeks post-surgery? (Patients are not recommended to drive post-surgery for 8 weeks)  Driver: Aaron [name] and other family members    Do you have airbags in your vehicle?  There is a risk of discharging the device if the airbag were to deploy. What do you know about your diet post-surgery? Patient Verbalizes Understanding  of Heart healthy How do you plan to monitor your medications, current and future?   Will try pillbox How do you plan to complete ADL's post-surgery?  Family support Will it be difficult to ask for help from your caregivers?  No  Please explain what you hope will be improved about your life as a result of receiving the LVAD? "staying alive" Please tell me your biggest concern or fear about living with the LVAD?  Getting through the surgery and finances Please explain your understanding of how their body will change. Are you worried about these changes? Patient stated he does have concerns regarding body image.  Do you see any barriers to your surgery or follow-up? None identified  Understanding of LVAD Patient states understanding of the following: Surgical procedures and risks, Electrical need for LVAD (3 prong outlets), Safety precautions with LVAD (water,  etc.), LVAD daily self-care (dressing changes, computer check, extra supplies), Outpatient follow up (LVAD clinic appts, monitoring blood thinners) and Need for Emergency Planning  Discussed and Reviewed with Patient and Caregiver  Patient's current level of motivation to prepare for LVAD: Motivated for "life" Patient's present Level of Consent for LVAD: Ready    Education provided to patient/family/caregiver:   Caregiver role and responsibiltiy, Financial planning for LVAD, Role of Clinical Social Worker and Signs of Depression and Anxiety   Caregiver questions Please explain what you hope will be improved about your life and loved one's life as a result of receiving the LVAD?  Improved quality of life What is your biggest concern or fear about caregiving with an LVAD patient?  "get through this... all goes well. What is your plan for availability to provide care 24/7 x2 weeks post op and dressing changes ongoing?  Aaron [name] is not currently working and has the availability. She has been a CMA for 30+ years. Who is the relief/backup caregiver and what is their availability? Aaron Kitchens is "in between jobs"  Preferred method of learning? Hands on  Do you drive? yes How do you handle stressful situations?  Pretty good Do you think you can do this? Yes absolutely Is there anything that concerns about caregiving?  no Do you provide caregiving to anyone else? no  Caregiver's current level of motivation to prepare for LVAD: Highly motivated Caregiver's present level of consent for LVAD: Ready  Clinical Interventions Needed:    CSW will monitor signs and symptoms of depression and assist with adjustment to life with an LVAD. CSW will provide supportive intervention around concerns with body image. CSW will refer patient for Advanced Directive if not completed prior to surgery. CSW will monitor substance use (alcohol and tobacco use) and provide supportive interventions and referrals to community  resources as needed.   CSW encouraged attendance with the LVAD Support Group to assist further with adjustment and post implant peer support.  Clinical Impressions/Recommendations:   Aaron [name] is a 54 yo male who has been divorced for 3 years and has an 41yo son Aaron [name] who mostly lives with ex-wife. He lives alone with his dog named Public relations account executive. He works for a company that [employer] and he works as the Radio broadcast assistant. He denies any particular faith and his primary language is [place]. He was working full time and under no medical care or medications prior to this admission. He does not have a advanced directive and has been referred to Spiritual Care for completion.  His sister Aaron [name] is very supportive and he also has a half brother  who is supportive as well. He denies any social clubs and states he loves to "piddle around the house and work on his cars and truck". He completed the 12th grade and his preferred method of learning is hands on. He was in the [employer] for 4 years and is not service connected. He has BC/BS as his insurance through his employer and plans to apply for short term disability. He is unsure about returning to work post surgery and states he will see how he feels. He states he has been feeling antsy and jittery since admission ut has no mental health history. He does admit to alcohol and tobacco use on a daily basis. He is open to community resources to assist with quitting both substances post hospitalization. Patient appears to have a good support system and is adjusting to the concept of life with an LVAD. CSW would recommend patient for LVAD implant and will provide the above mentioned interventions pre and post implant in conjunction with the VAD team.   Melba Coon, [place]

## 2019-06-24 NOTE — Anesthesia Preprocedure Evaluation (Addendum)
Anesthesia Evaluation  Patient identified by MRN, date of birth, ID band Patient awake    Reviewed: Allergy & Precautions, NPO status , Patient's Chart, lab work & pertinent test results  Airway Mallampati: III  TM Distance: >3 FB Neck ROM: Full  Mouth opening: Limited Mouth Opening  Dental  (+) Dental Advisory Given   Pulmonary Current Smoker,    breath sounds clear to auscultation       Cardiovascular hypertension, Pt. on medications and Pt. on home beta blockers + CAD, + Past MI and +CHF   Rhythm:Regular Rate:Normal  EF 20-25%. Good RV function. Mild MR   Neuro/Psych negative neurological ROS     GI/Hepatic negative GI ROS, Neg liver ROS,   Endo/Other    Renal/GU Renal InsufficiencyRenal disease     Musculoskeletal   Abdominal   Peds  Hematology  (+) anemia ,   Anesthesia Other Findings   Reproductive/Obstetrics                            Anesthesia Physical Anesthesia Plan  ASA: IV  Anesthesia Plan: General   Post-op Pain Management:    Induction: Intravenous  PONV Risk Score and Plan: 1 and Dexamethasone, Ondansetron and Treatment may vary due to age or medical condition  Airway Management Planned: Oral ETT and Video Laryngoscope Planned  Additional Equipment: Arterial line, CVP, PA Cath, TEE and Ultrasound Guidance Line Placement  Intra-op Plan:   Post-operative Plan: Post-operative intubation/ventilation  Informed Consent: I have reviewed the patients History and Physical, chart, labs and discussed the procedure including the risks, benefits and alternatives for the proposed anesthesia with the patient or authorized representative who has indicated his/her understanding and acceptance.     Dental advisory given  Plan Discussed with: CRNA  Anesthesia Plan Comments:        Anesthesia Quick Evaluation

## 2019-06-24 NOTE — Consult Note (Signed)
[address]       [place] [address]             [phone]        Dael W Hailu [place] Medical Record #[id number] Date of Birth: [birth year]  Referring: No ref. provider found Primary Care: Patient, No Pcp Per Primary Cardiologist:No primary care provider on file.  Chief Complaint:   No chief complaint on file. Chest pain  History of Present Illness:     The patient is a 54 year old gentleman with past medical history notable for hypertension who experienced sudden onset chest pain on 06/17/2019.  When this did not resolve he presented to the emergency department locally.  There he was diagnosed with a STEMI.  He was taken to the Cath Lab which demonstrated severe multivessel coronary artery disease,  depressed left ventricular function, and severely elevated LVEDP.  An intra-aortic balloon pump was placed and the patient was transferred to [facility] for consideration of CABG.  However, upon review his films he was considered a poor candidate for the operation due to severity of his native coronary disease.  He was ultimately weaned off the balloon pump and additional considerations were made for definitive treatment.  In the meantime he has been relatively stable on medical therapy which includes continuous IV milrinone and amiodarone to suppress ventricular tachyarrhythmias.  Consultation is requested for consideration of HeartMate 3 LVAD implantation. Current Activity/ Functional Status: Patient will be independent with mobility/ambulation, transfers, ADL's, IADL's.   Zubrod Score: At the time of surgery this patient's most appropriate activity status/level should be described as: []     0    Normal activity, no symptoms []     1    Restricted in physical strenuous activity but ambulatory, able to do out light work []     2    Ambulatory and capable of self care, unable to do work activities, up and about                 more than 50%  Of the time                             []     3    Only limited self care, in bed greater than 50% of waking hours []     4    Completely disabled, no self care, confined to bed or chair []     5    Moribund  Past Medical History:  Diagnosis Date  . Acute anterolateral wall MI ([place])   . CAD, multiple vessel   . Hypertension   . Ischemic cardiomyopathy   . Systolic heart failure ([place])   . Tobacco abuse     Past Surgical History:  Procedure Laterality Date  . CORONARY/GRAFT ACUTE MI REVASCULARIZATION N/A 06/17/2019   Procedure: Coronary/Graft Acute MI Revascularization;  Surgeon: Yolonda Kida, MD;  Location: [place] CV LAB;  Service: Cardiovascular;  Laterality: N/A;  . IABP INSERTION N/A 06/17/2019   Procedure: IABP Insertion;  Surgeon: Yolonda Kida, MD;  Location: [place] CV LAB;  Service: Cardiovascular;  Laterality: N/A;  . LEFT HEART CATH AND CORONARY ANGIOGRAPHY N/A 06/17/2019   Procedure: LEFT HEART CATH AND CORONARY ANGIOGRAPHY;  Surgeon: Yolonda Kida, MD;  Location: [place] CV LAB;  Service: Cardiovascular;  Laterality: N/A;  . RIGHT HEART CATH N/A 06/17/2019   Procedure: RIGHT HEART CATH;  Surgeon:  Callwood, Dwayne D, MD;  Location: [place] CV LAB;  Service: Cardiovascular;  Laterality: N/A;    Social History   Tobacco Use  Smoking Status Current Every Day Smoker  . Packs/day: 1.50  . Years: 30.00  . Pack years: 45.00  Smokeless Tobacco Never Used    Social History   Substance and Sexual Activity  Alcohol Use Yes  . Alcohol/week: 7.0 - 14.0 standard drinks  . Types: 7 - 14 Standard drinks or equivalent per week     Allergies  Allergen Reactions  . Codeine Hives    Current Facility-Administered Medications  Medication Dose Route Frequency Provider Last Rate Last Admin  . 0.9 %  sodium chloride infusion  250 mL Intravenous PRN Yolonda Kida, MD   Stopped at 06/19/19 1443  . 0.9 %  sodium chloride infusion   Intravenous PRN Jerline Pain, MD   Stopped at 06/18/19 [phone]  . 0.9 %  sodium chloride infusion   Intravenous PRN Jerline Pain, MD   Stopped at 06/19/19 1446  . acetaminophen (TYLENOL) tablet 650 mg  650 mg Oral Q4H PRN Callwood, Dwayne D, MD      . ALPRAZolam Duanne Moron) tablet 0.25 mg  0.25 mg Oral TID PRN Clegg, Amy D, NP   0.25 mg at 06/23/19 1715  . amiodarone (NEXTERONE PREMIX) 360-4.14 MG/200ML-% (1.8 mg/mL) IV infusion  30 mg/hr Intravenous Continuous Bensimhon, Shaune Pascal, MD 16.67 mL/hr at 06/24/19 0700 30 mg/hr at 06/24/19 0700  . aspirin chewable tablet 81 mg  81 mg Oral Daily Callwood, Dwayne D, MD   81 mg at 06/23/19 0908  . atorvastatin (LIPITOR) tablet 80 mg  80 mg Oral Daily Meade Maw, MD   80 mg at 06/23/19 0907  . Chlorhexidine Gluconate Cloth 2 % PADS 6 each  6 each Topical Daily Jerline Pain, MD   6 each at 06/23/19 1204  . clonazePAM (KLONOPIN) tablet 0.5 mg  0.5 mg Oral BID Bensimhon, Shaune Pascal, MD   0.5 mg at 06/23/19 2123  . digoxin (LANOXIN) tablet 0.125 mg  0.125 mg Oral Daily Bensimhon, Shaune Pascal, MD   0.125 mg at 06/23/19 0907  . enoxaparin (LOVENOX) injection 40 mg  40 mg Subcutaneous Q24H Bensimhon, Shaune Pascal, MD   40 mg at 06/23/19 1716  . feeding supplement (ENSURE ENLIVE) (ENSURE ENLIVE) liquid 237 mL  237 mL Oral BID BM Turner, Traci R, MD   237 mL at 06/23/19 1635  . fentaNYL (SUBLIMAZE) injection 25 mcg  25 mcg Intravenous Q4H PRN Bensimhon, Shaune Pascal, MD      . folic acid (FOLVITE) tablet 1 mg  1 mg Oral Daily Clegg, Amy D, NP   1 mg at 06/23/19 0908  . furosemide (LASIX) injection 80 mg  80 mg Intravenous BID Bensimhon, Shaune Pascal, MD   80 mg at 06/23/19 1714  . MEDLINE mouth rinse  15 mL Mouth Rinse BID Jerline Pain, MD   15 mL at 06/23/19 0909  . milrinone (PRIMACOR) 20 MG/100 ML (0.2 mg/mL) infusion  0.125 mcg/kg/min Intravenous Continuous Bensimhon, Shaune Pascal, MD 3.58 mL/hr at 06/24/19 0700 0.125 mcg/kg/min at 06/24/19 0700  . morphine 2 MG/ML injection 2 mg  2 mg Intravenous  Q2H PRN Meade Maw, MD   2 mg at 06/20/19 2013  . multivitamin with minerals tablet 1 tablet  1 tablet Oral Daily Clegg, Amy D, NP   1 tablet at 06/23/19 0908  . nitroGLYCERIN (NITROSTAT) SL tablet  0.4 mg  0.4 mg Sublingual Q5 Min x 3 PRN Paticia Stack, Mohammed W, MD      . ondansetron [facility]) injection 4 mg  4 mg Intravenous Q6H PRN Callwood, Dwayne D, MD      . pneumococcal 23 valent vaccine (PNEUMOVAX-23) injection 0.5 mL  0.5 mL Intramuscular Prior to discharge Sueanne Margarita, MD      . sacubitril-valsartan (ENTRESTO) 24-26 mg per tablet  1 tablet Oral BID Bensimhon, Shaune Pascal, MD   1 tablet at 06/23/19 2122  . sodium chloride flush (NS) 0.9 % injection 10-40 mL  10-40 mL Intracatheter Q12H Wonda Olds, MD   30 mL at 06/23/19 0909  . sodium chloride flush (NS) 0.9 % injection 10-40 mL  10-40 mL Intracatheter PRN Ronnette Rump, Glenice Bow, MD      . sodium chloride flush (NS) 0.9 % injection 3 mL  3 mL Intravenous Q12H Callwood, Dwayne D, MD   3 mL at 06/23/19 0909  . sodium chloride flush (NS) 0.9 % injection 3 mL  3 mL Intravenous PRN Callwood, Dwayne D, MD      . spironolactone (ALDACTONE) tablet 25 mg  25 mg Oral Daily Clegg, Amy D, NP   25 mg at 06/23/19 0907  . thiamine tablet 100 mg  100 mg Oral Daily Clegg, Amy D, NP   100 mg at 06/23/19 0908  . traZODone (DESYREL) tablet 100 mg  100 mg Oral QHS PRN Bensimhon, Shaune Pascal, MD   100 mg at 06/23/19 2123    Medications Prior to Admission  Medication Sig Dispense Refill Last Dose  . heparin 25000-0.45 UT/250ML-% infusion Inject 1,200 Units/hr into the vein continuous. 1200 mL 0     Family History  Problem Relation Age of Onset  . CAD Father   . Valvular heart disease Father      Review of Systems:   ROS A comprehensive review of systems was negative.     Cardiac Review of Systems: Y or  [    ]= no  Chest Pain [    ]  Resting SOB [   ] Exertional SOB  [  ]  Orthopnea [  ]   Pedal Edema [   ]    Palpitations [  ] Syncope  [  ]     Presyncope [   ]  General Review of Systems: [Y] = yes [  ]=no Constitional: recent weight change [  ]; anorexia [  ]; fatigue [  ]; nausea [  ]; night sweats [  ]; fever [  ]; or chills [  ]                                                               Dental: Last Dentist visit:   Eye : blurred vision [  ]; diplopia [   ]; vision changes [  ];  Amaurosis fugax[  ]; Resp: cough [  ];  wheezing[  ];  hemoptysis[  ]; shortness of breath[  ]; paroxysmal nocturnal dyspnea[  ]; dyspnea on exertion[  ]; or orthopnea[  ];  GI:  gallstones[  ], vomiting[  ];  dysphagia[  ]; melena[  ];  hematochezia [  ]; heartburn[  ];   Hx of  Colonoscopy[  ];  GU: kidney stones [  ]; hematuria[  ];   dysuria [  ];  nocturia[  ];  history of     obstruction [  ]; urinary frequency [  ]             Skin: rash, swelling[  ];, hair loss[  ];  peripheral edema[  ];  or itching[  ]; Musculosketetal: myalgias[  ];  joint swelling[  ];  joint erythema[  ];  joint pain[  ];  back pain[  ];  Heme/Lymph: bruising[  ];  bleeding[  ];  anemia[  ];  Neuro: TIA[  ];  headaches[  ];  stroke[  ];  vertigo[  ];  seizures[  ];   paresthesias[  ];  difficulty walking[  ];  Psych:depression[  ]; anxiety[  ];  Endocrine: diabetes[  ];  thyroid dysfunction[  ];         Physical Exam: BP 101/78   Pulse 78   Temp 97.6 F (36.4 C)   Resp (!) 21   Ht 5\' 9"  (1.753 m)   Wt 92.4 kg   SpO2 93%   BMI 30.08 kg/m    General appearance: alert and cooperative Head: Normocephalic, without obvious abnormality, atraumatic Resp: diminished breath sounds bibasilar Cardio: regular rate and rhythm, S1, S2 normal, no murmur, click, rub or gallop GI: soft, non-tender; bowel sounds normal; no masses,  no organomegaly Extremities: extremities normal, atraumatic, no cyanosis or edema Neurologic: Alert and oriented X 3, normal strength and tone. Normal symmetric reflexes. Normal coordination and gait  Diagnostic Studies & Laboratory data:      Recent Radiology Findings:   DG Orthopantogram  Result Date: 06/22/2019 CLINICAL DATA:  Preoperative evaluation. EXAM: ORTHOPANTOGRAM/PANORAMIC COMPARISON:  None. FINDINGS: No fracture or dislocation is noted. No lytic destruction is noted. No significant dental disease is noted. IMPRESSION: Negative. Electronically Signed   By: Marijo Conception M.D.   On: 06/22/2019 18:05   DG Chest 2 View  Result Date: 06/22/2019 CLINICAL DATA:  Preoperative examination. EXAM: CHEST - 2 VIEW COMPARISON:  June 18, 2019. FINDINGS: Stable cardiomegaly with mild central pulmonary vascular congestion. No pneumothorax or pleural effusion is noted. No consolidative process is noted. Right-sided PICC line was noted with distal tip in expected position of the SVC. Bony thorax is unremarkable. IMPRESSION: Stable cardiomegaly with mild central pulmonary vascular congestion. Electronically Signed   By: Marijo Conception M.D.   On: 06/22/2019 18:07   VAS US DOPPLER PRE VAD  Result Date: 06/23/2019 PERIOPERATIVE VASCULAR EVALUATION Indications:      Pre-VAD evaluation. Comparison Study: No prior study Performing Technologist: Maudry Mayhew MHA, RVT, RDCS, RDMS  Examination Guidelines: A complete evaluation includes B-mode imaging, spectral Doppler, color Doppler, and power Doppler as needed of all accessible portions of each vessel. Bilateral testing is considered an integral part of a complete examination. Limited examinations for reoccurring indications may be performed as noted.  Left Carotid Findings: +----------+--------+--------+--------+------------+ SubclavianPSV cm/sEDV cm/sDescribeArm Pressure +----------+--------+--------+--------+------------+                                   85           +----------+--------+--------+--------+------------+  ABI Findings: +--------+------------------+-----+---------+----------------------------------+ Right   Rt Pressure (mmHg)IndexWaveform Comment                             +--------+------------------+-----+---------+----------------------------------+  Brachial                       triphasicUnable to obtain pressure due to                                           PICC line                          +--------+------------------+-----+---------+----------------------------------+ PTA     96                1.13 triphasic                                   +--------+------------------+-----+---------+----------------------------------+ DP      87                1.02 triphasic                                   +--------+------------------+-----+---------+----------------------------------+ +--------+------------------+-----+---------+-------+ Left    Lt Pressure (mmHg)IndexWaveform Comment +--------+------------------+-----+---------+-------+ Brachial85                     triphasic        +--------+------------------+-----+---------+-------+ PTA     103               1.21 triphasic        +--------+------------------+-----+---------+-------+ DP      72                0.85 triphasic        +--------+------------------+-----+---------+-------+ +-------+---------------+----------------+ ABI/TBIToday's ABI/TBIPrevious ABI/TBI +-------+---------------+----------------+ Right  1.13                            +-------+---------------+----------------+ Left   1.21                            +-------+---------------+----------------+  Right ABI: Resting right ankle-brachial index is within normal range. No evidence of significant right lower extremity arterial disease. Left ABI: Resting left ankle-brachial index is within normal range. No evidence of significant left lower extremity arterial disease.  Electronically signed by Servando Snare MD on 06/23/2019 at 5:15:44 PM.   Final    VAS [place] LOWER EXTREMITY VENOUS (DVT)  Result Date: 06/22/2019  Lower Venous DVTStudy Indications: Pre-op.  Risk Factors: None identified. Comparison  Study: No prior studies. Performing Technologist: Oliver Hum RVT  Examination Guidelines: A complete evaluation includes B-mode imaging, spectral Doppler, color Doppler, and power Doppler as needed of all accessible portions of each vessel. Bilateral testing is considered an integral part of a complete examination. Limited examinations for reoccurring indications may be performed as noted. The reflux portion of the exam is performed with the patient in reverse Trendelenburg.  +---------+---------------+---------+-----------+----------+--------------+ RIGHT    CompressibilityPhasicitySpontaneityPropertiesThrombus Aging +---------+---------------+---------+-----------+----------+--------------+ CFV      Full           Yes      Yes                                 +---------+---------------+---------+-----------+----------+--------------+  SFJ      Full                                                        +---------+---------------+---------+-----------+----------+--------------+ FV Prox  Full                                                        +---------+---------------+---------+-----------+----------+--------------+ FV Mid   Full                                                        +---------+---------------+---------+-----------+----------+--------------+ FV DistalFull                                                        +---------+---------------+---------+-----------+----------+--------------+ PFV      Full                                                        +---------+---------------+---------+-----------+----------+--------------+ POP      Full           Yes      Yes                                 +---------+---------------+---------+-----------+----------+--------------+ PTV      Full                                                        +---------+---------------+---------+-----------+----------+--------------+ PERO     Full                                                         +---------+---------------+---------+-----------+----------+--------------+   +---------+---------------+---------+-----------+----------+--------------+ LEFT     CompressibilityPhasicitySpontaneityPropertiesThrombus Aging +---------+---------------+---------+-----------+----------+--------------+ CFV      Full           Yes      Yes                                 +---------+---------------+---------+-----------+----------+--------------+ SFJ      Full                                                        +---------+---------------+---------+-----------+----------+--------------+  FV Prox  Full                                                        +---------+---------------+---------+-----------+----------+--------------+ FV Mid   Full                                                        +---------+---------------+---------+-----------+----------+--------------+ FV DistalFull                                                        +---------+---------------+---------+-----------+----------+--------------+ PFV      Full                                                        +---------+---------------+---------+-----------+----------+--------------+ POP      Full           Yes      Yes                                 +---------+---------------+---------+-----------+----------+--------------+ PTV      Full                                                        +---------+---------------+---------+-----------+----------+--------------+ PERO     Full                                                        +---------+---------------+---------+-----------+----------+--------------+     Summary: RIGHT: - There is no evidence of deep vein thrombosis in the lower extremity.  - No cystic structure found in the popliteal fossa.  LEFT: - There is no evidence of deep vein thrombosis in the lower  extremity.  - No cystic structure found in the popliteal fossa.  *See table(s) above for measurements and observations. Electronically signed by Ruta Hinds MD on 06/22/2019 at 3:36:10 PM.    Final      I have independently reviewed the above radiologic studies and discussed with the patient   Recent Lab Findings: Lab Results  Component Value Date   WBC 8.8 06/24/2019   HGB 15.4 06/24/2019   HCT 45.0 06/24/2019   PLT 163 06/24/2019   GLUCOSE 129 (H) 06/24/2019   CHOL 196 06/19/2019   TRIG 72 06/19/2019   HDL 59 06/19/2019   LDLCALC 123 (H) 06/19/2019   ALT 125 (H) 06/23/2019   AST 123 (H) 06/23/2019   NA 131 (L) 06/24/2019   K 3.9  06/24/2019   CL 96 (L) 06/24/2019   CREATININE 1.23 06/24/2019   BUN 20 06/24/2019   CO2 24 06/24/2019   TSH 5.402 (H) 06/20/2019   INR 1.1 06/22/2019   HGBA1C 5.4 06/22/2019      Assessment / Plan:    54 yo man with severe, multivessel CAD not amenable to surgical or percutaneous revascularization due to poor target vessels and poor run-off. As a result, he has severely depressed LV function, and hemodynamics are very tenuous since little coronary perfusion reserve. He is class 4 heart failure. Agree with plan for Heart Mate 3 LVAD urgently as he is not a candidate for OHT (on-going tobacco abuse). He is a good candidate for LVAD with no major or prohibitive perioperative risks.  We will hold an LVAD MRB today for final decision making but tentatively plan for HeartMate 3 LVAD implantation tomorrow.    I  spent 30 minutes counseling the patient face to face.   Latresa Gasser Z. Orvan Seen, MD [phone] 06/24/2019 8:43 AM

## 2019-06-24 NOTE — H&P (View-Only) (Signed)
Advanced Heart Failure Rounding Note  PCP-Cardiologist: No primary care provider on file.   Subjective:    IABP removed 6/18.   Co-ox 57% on milrinone 0.125 mcg. Continues on amio 30 mg per hour.  CVP 13. I/Os not accurate. Only 275 cc charted yesterday. He confirms that he did not use designated urinal much yesterday.   Frequent apnea observed overnight.   Feels ok. Denies CP. Continues w/ orthopnea.      Objective:   Weight Range: 92.4 kg Body mass index is 30.08 kg/m.   Vital Signs:   Temp:  [97.6 F (36.4 C)-98.9 F (37.2 C)] 97.6 F (36.4 C) (06/23 0737) Pulse Rate:  [75-90] 78 (06/23 0700) Resp:  [12-34] 21 (06/23 0700) BP: (78-119)/(57-86) 101/78 (06/23 0700) SpO2:  [93 %-99 %] 93 % (06/23 0700) Last BM Date: 06/23/19  Weight change: Filed Weights   06/21/19 0628 06/22/19 0359 06/23/19 0500  Weight: 95.4 kg 96 kg 92.4 kg    Intake/Output:   Intake/Output Summary (Last 24 hours) at 06/24/2019 0752 Last data filed at 06/24/2019 0700 Gross per 24 hour  Intake 483.44 ml  Output 275 ml  Net 208.44 ml      Physical Exam  CVP 13  General: fatigue appearing.  No resp difficulty HEENT: normal Neck: supple. JVP elevated to ear . Carotids 2+ bilat; no bruits. No lymphadenopathy or thryomegaly appreciated. Cor: PMI nondisplaced. Regular rate & rhythm. No rubs, or murmurs. +S3  Lungs: clear Abdomen: soft, nontender, nondistended. No hepatosplenomegaly. No bruits or masses. Good bowel sounds. Extremities: no cyanosis, clubbing, rash, edema + RUE PICC Neuro: alert & orientedx3, cranial nerves grossly intact. moves all 4 extremities w/o difficulty. Affect pleasant  Telemetry   NSR w/ PVCs    Labs    CBC Recent Labs    06/23/19 0457 06/23/19 0457 06/23/19 1357 06/24/19 0448  WBC 8.8  --   --  8.8  HGB 14.0   < > 14.6 15.4  HCT 41.4   < > 43.0 45.0  MCV 97.2  --   --  97.4  PLT 136*  --   --  163   < > = values in this interval not displayed.    Basic Metabolic Panel Recent Labs    06/23/19 0457 06/23/19 0457 06/23/19 1357 06/24/19 0448  NA 131*   < > 131* 131*  K 3.2*   < > 4.0 3.9  CL 96*  --   --  96*  CO2 23  --   --  24  GLUCOSE 161*  --   --  129*  BUN 24*  --   --  20  CREATININE 1.13  --   --  1.23  CALCIUM 8.0*  --   --  8.5*   < > = values in this interval not displayed.   Liver Function Tests Recent Labs    06/23/19 1430  AST 123*  ALT 125*  ALKPHOS 57  BILITOT 0.9  PROT 6.1*  ALBUMIN 2.5*   No results for input(s): LIPASE, AMYLASE in the last 72 hours. Cardiac Enzymes No results for input(s): CKTOTAL, CKMB, CKMBINDEX, TROPONINI in the last 72 hours.  BNP: BNP (last 3 results) Recent Labs    06/17/19 2023 06/18/19 0119  BNP 751.1* 1,068.5*    ProBNP (last 3 results) No results for input(s): PROBNP in the last 8760 hours.   D-Dimer No results for input(s): DDIMER in the last 72 hours. Hemoglobin A1C Recent Labs  06/22/19 1159  HGBA1C 5.4   Fasting Lipid Panel No results for input(s): CHOL, HDL, LDLCALC, TRIG, CHOLHDL, LDLDIRECT in the last 72 hours. Thyroid Function Tests No results for input(s): TSH, T4TOTAL, T3FREE, THYROIDAB in the last 72 hours.  Invalid input(s): FREET3  Other results:   Imaging    VAS US DOPPLER PRE VAD  Result Date: 06/23/2019 PERIOPERATIVE VASCULAR EVALUATION Indications:      Pre-VAD evaluation. Comparison Study: No prior study Performing Technologist: Maudry Mayhew MHA, RVT, RDCS, RDMS  Examination Guidelines: A complete evaluation includes B-mode imaging, spectral Doppler, color Doppler, and power Doppler as needed of all accessible portions of each vessel. Bilateral testing is considered an integral part of a complete examination. Limited examinations for reoccurring indications may be performed as noted.  Left Carotid Findings: +----------+--------+--------+--------+------------+ SubclavianPSV cm/sEDV cm/sDescribeArm Pressure  +----------+--------+--------+--------+------------+                                   85           +----------+--------+--------+--------+------------+  ABI Findings: +--------+------------------+-----+---------+----------------------------------+ Right   Rt Pressure (mmHg)IndexWaveform Comment                            +--------+------------------+-----+---------+----------------------------------+ Brachial                       triphasicUnable to obtain pressure due to                                           PICC line                          +--------+------------------+-----+---------+----------------------------------+ PTA     96                1.13 triphasic                                   +--------+------------------+-----+---------+----------------------------------+ DP      87                1.02 triphasic                                   +--------+------------------+-----+---------+----------------------------------+ +--------+------------------+-----+---------+-------+ Left    Lt Pressure (mmHg)IndexWaveform Comment +--------+------------------+-----+---------+-------+ Brachial85                     triphasic        +--------+------------------+-----+---------+-------+ PTA     103               1.21 triphasic        +--------+------------------+-----+---------+-------+ DP      72                0.85 triphasic        +--------+------------------+-----+---------+-------+ +-------+---------------+----------------+ ABI/TBIToday's ABI/TBIPrevious ABI/TBI +-------+---------------+----------------+ Right  1.13                            +-------+---------------+----------------+ Left   1.21                            +-------+---------------+----------------+  Right ABI: Resting right ankle-brachial index is within normal range. No evidence of significant right lower extremity arterial disease. Left ABI: Resting left ankle-brachial  index is within normal range. No evidence of significant left lower extremity arterial disease.  Electronically signed by Servando Snare MD on 06/23/2019 at 5:15:44 PM.   Final      Medications:     Scheduled Medications: . aspirin  81 mg Oral Daily  . atorvastatin  80 mg Oral Daily  . Chlorhexidine Gluconate Cloth  6 each Topical Daily  . clonazePAM  0.5 mg Oral BID  . digoxin  0.125 mg Oral Daily  . enoxaparin (LOVENOX) injection  40 mg Subcutaneous Q24H  . feeding supplement (ENSURE ENLIVE)  237 mL Oral BID BM  . folic acid  1 mg Oral Daily  . furosemide  80 mg Intravenous BID  . mouth rinse  15 mL Mouth Rinse BID  . multivitamin with minerals  1 tablet Oral Daily  . sacubitril-valsartan  1 tablet Oral BID  . sodium chloride flush  10-40 mL Intracatheter Q12H  . sodium chloride flush  3 mL Intravenous Q12H  . spironolactone  25 mg Oral Daily  . thiamine  100 mg Oral Daily    Infusions: . sodium chloride Stopped (06/19/19 1443)  . sodium chloride Stopped (06/18/19 0953)  . sodium chloride Stopped (06/19/19 1446)  . amiodarone 30 mg/hr (06/24/19 0700)  . milrinone 0.125 mcg/kg/min (06/24/19 0700)    PRN Medications: sodium chloride, sodium chloride, sodium chloride, acetaminophen, ALPRAZolam, fentaNYL (SUBLIMAZE) injection, morphine injection, nitroGLYCERIN, ondansetron (ZOFRAN) IV, pneumococcal 23 valent vaccine, sodium chloride flush, sodium chloride flush, traZODone    Patient Profile   Aaron [name] --post MI cardiogenic shock/ optimization prior to anticipated CABG, at the request of Dr. Marlou Porch, Cardiology.    Assessment/Plan   1. Acute Anterolateral STEMI/ Multivessel CAD - Emergent cath showed severe multivessel disease w/ 75% mid- distal LM, 100% prox LAD, not amendable to PCI (unable to cross w/ guide wire, probable CTO), 100% prox LCx (probable CTO) and 100% ostial RCA (probable CTO). There were mild to moderate collaterals right to left to the LAD, minimal  right to right collaterals an minimal left to left collaterals. LVEF 25% - Hs trop peaked to 17,602>27,000. - No targets for CABG / PCI.  VAD workup pending.  - IABP out - No chest pain.  - Continue ASA 81 mg - Continue Atorvastatin 80. - no ? blocker w/ shock    2.  Acute Systolic Heart Failure --->Cardiogenic Shock - 2/2 acute MI. LVEF by LVG 25% - IABP out 6/18 - On milrinone 0.125 mcg. CO-OX 57%  - Continue entresto 24-26 mg twice a day - Continue spiro  25 mg daily.  - CVP 13.  - Continue IV lasix 80 mg bid. K low will supp.  - Continue dig, dig level 0.3.  - No b-blocker yet with shock - Case reviewed with TCTS and IC team. No targets for CABG or PCI. He has minimal coronary circulation remaining. Transplant off the table with tobacco use.   - Will need durable VAD this admit. Scheduled for 6/24. He remains tenuous. Will put IABP back in + Rt IJ Swan for stabilization prior to VAD.   3. NSVT - improved - keep K> 4.0 Mg >2.0  - continue amio - Supp K - low threshold to put IABP back  4. Hypertension  - reported h/o untreated HTN prior to admit - Controlled on current regimen  5. Tobacco Abuse - 30 pack years  - smoking cessation advised  - Will need to quit if to be considered for heart transplant in the future   6. Elevated LFTs due to shock liver - improved  7. ETOH  - Drinks 2 mixed drinks a night + more on the weekend.    8. Renal mass - seen on CT -  Concern for RCC.  - MRI abdomen- 1.8 cm right renal lesion, most consistent with renal cell carcinoma. No evidence of renal vein involvement or nodal metastasis. - Discussed w/ Urology and R RCC is small and will just require surveillance. - ok to proceed with VAD from their standpoint.   9. Thrombocytopenia - PLTs on admit 172   -Down to 92>117 >136>163 - On lovenox  10. Hyponatremia  - Sodium 131  11. Sleep Apnea - frequent apnea at night  - needs formal sleep study   - order CPAP while  inpatient   Length of Stay: [address], PA-C  06/24/2019, 7:52 AM  Advanced Heart Failure Team Pager [phone] (M-F; 7a - 4p)  Please contact [facility] for night-coverage after hours (4p -7a ) and weekends on [email or web address]  Agree with above.   Remains quite tenuous. Co-ox marginal. CVP up. Having some apnea overnight. Plan for VAD tomorrow  General:   Sitting up in bed. No resp difficulty HEENT: normal Neck: supple. no JVD. Carotids 2+ bilat; no bruits. No lymphadenopathy or thryomegaly appreciated. Cor: PMI nondisplaced. Regular rate & rhythm. + PVCs No rubs, gallops or murmurs. Lungs: clear Abdomen: soft, nontender, nondistended. No hepatosplenomegaly. No bruits or masses. Good bowel sounds. Extremities: no cyanosis, clubbing, rash, edema Neuro: alert & orientedx3, cranial nerves grossly intact. moves all 4 extremities w/o difficulty. Affect pleasant  For VAD tomorrow. He is currently not optimized. D/w TCTS. Will take to cath lab and place IABP and Swan to stabilize/optimize for VAD implant tomorrow.   CRITICAL CARE Performed by: Glori Bickers  Total critical care time: 35 minutes  Critical care time was exclusive of separately billable procedures and treating other patients.  Critical care was necessary to treat or prevent imminent or life-threatening deterioration.  Critical care was time spent personally by me (independent of midlevel providers or residents) on the following activities: development of treatment plan with patient and/or surrogate as well as nursing, discussions with consultants, evaluation of patient's response to treatment, examination of patient, obtaining history from patient or surrogate, ordering and performing treatments and interventions, ordering and review of laboratory studies, ordering and review of radiographic studies, pulse oximetry and re-evaluation of patient's condition.  Glori Bickers, MD  10:58 AM

## 2019-06-24 NOTE — Progress Notes (Addendum)
CSW met with patient and son at bedside. Patient reports feeling tired today and scheduled for procedure this afternoon. Spiritual Care has not been by patient room to complete ADS yet and CSW will call for reminder.  Patient reports he is ready for surgery tomorrow. CSW provided support and will contact his sister to discuss plan for information during surgery as patient identified his sister as the primary contact person. Raquel Sarna, [place], [place]

## 2019-06-25 ENCOUNTER — Encounter (HOSPITAL_COMMUNITY): Payer: Self-pay | Admitting: Internal Medicine

## 2019-06-25 ENCOUNTER — Inpatient Hospital Stay (HOSPITAL_COMMUNITY): Payer: BC Managed Care – PPO

## 2019-06-25 ENCOUNTER — Inpatient Hospital Stay (HOSPITAL_COMMUNITY)
Admission: AD | Disposition: A | Discharge status: Rehabilitation Facility | Payer: Self-pay | Source: Other Acute Inpatient Hospital | Attending: Cardiothoracic Surgery

## 2019-06-25 ENCOUNTER — Inpatient Hospital Stay (HOSPITAL_COMMUNITY): Payer: BC Managed Care – PPO | Admitting: Anesthesiology

## 2019-06-25 DIAGNOSIS — I255 Ischemic cardiomyopathy: Secondary | ICD-10-CM | POA: Diagnosis present

## 2019-06-25 DIAGNOSIS — I420 Dilated cardiomyopathy: Secondary | ICD-10-CM | POA: Diagnosis present

## 2019-06-25 DIAGNOSIS — I509 Heart failure, unspecified: Secondary | ICD-10-CM

## 2019-06-25 HISTORY — PX: TEE WITHOUT CARDIOVERSION: SHX5443

## 2019-06-25 HISTORY — PX: INSERTION OF IMPLANTABLE LEFT VENTRICULAR ASSIST DEVICE: SHX5866

## 2019-06-25 LAB — COOXEMETRY PANEL
Carboxyhemoglobin: 1.4 % (ref 0.5–1.5)
Carboxyhemoglobin: 1 % (ref 0.5–1.5)
Methemoglobin: 0.9 % (ref 0.0–1.5)
Methemoglobin: 1 % (ref 0.0–1.5)
O2 Saturation: 71.4 %
O2 Saturation: 56.3 %
Total hemoglobin: 16.3 g/dL — ABNORMAL HIGH (ref 12.0–16.0)
Total hemoglobin: 11.5 g/dL — ABNORMAL LOW (ref 12.0–16.0)

## 2019-06-25 LAB — POCT I-STAT 7, (LYTES, BLD GAS, ICA,H+H)
Acid-Base Excess: 2 mmol/L (ref 0.0–2.0)
Acid-Base Excess: 0 mmol/L (ref 0.0–2.0)
Bicarbonate: 26.5 mmol/L (ref 20.0–28.0)
Bicarbonate: 24.3 mmol/L (ref 20.0–28.0)
Calcium, Ion: 1.19 mmol/L (ref 1.15–1.40)
Calcium, Ion: 1.17 mmol/L (ref 1.15–1.40)
HCT: 33 % — ABNORMAL LOW (ref 39.0–52.0)
HCT: 32 % — ABNORMAL LOW (ref 39.0–52.0)
Hemoglobin: 11.2 g/dL — ABNORMAL LOW (ref 13.0–17.0)
Hemoglobin: 10.9 g/dL — ABNORMAL LOW (ref 13.0–17.0)
O2 Saturation: 97 %
O2 Saturation: 97 %
Patient temperature: 36.8
Patient temperature: 38.7
Potassium: 3.8 mmol/L (ref 3.5–5.1)
Potassium: 4.7 mmol/L (ref 3.5–5.1)
Sodium: 137 mmol/L (ref 135–145)
Sodium: 135 mmol/L (ref 135–145)
TCO2: 28 mmol/L (ref 22–32)
TCO2: 25 mmol/L (ref 22–32)
pCO2 arterial: 39.5 mmHg (ref 32.0–48.0)
pCO2 arterial: 39.7 mmHg (ref 32.0–48.0)
pH, Arterial: 7.434 (ref 7.350–7.450)
pH, Arterial: 7.402 (ref 7.350–7.450)
pO2, Arterial: 85 mmHg (ref 83.0–108.0)
pO2, Arterial: 101 mmHg (ref 83.0–108.0)

## 2019-06-25 LAB — GLUCOSE, CAPILLARY
Glucose-Capillary: 151 mg/dL — ABNORMAL HIGH (ref 70–99)
Glucose-Capillary: 113 mg/dL — ABNORMAL HIGH (ref 70–99)
Glucose-Capillary: 114 mg/dL — ABNORMAL HIGH (ref 70–99)
Glucose-Capillary: 112 mg/dL — ABNORMAL HIGH (ref 70–99)
Glucose-Capillary: 117 mg/dL — ABNORMAL HIGH (ref 70–99)
Glucose-Capillary: 122 mg/dL — ABNORMAL HIGH (ref 70–99)
Glucose-Capillary: 117 mg/dL — ABNORMAL HIGH (ref 70–99)
Glucose-Capillary: 98 mg/dL (ref 70–99)
Glucose-Capillary: 118 mg/dL — ABNORMAL HIGH (ref 70–99)
Glucose-Capillary: 165 mg/dL — ABNORMAL HIGH (ref 70–99)

## 2019-06-25 LAB — PREPARE FRESH FROZEN PLASMA
Unit division: 0
Unit division: 0

## 2019-06-25 LAB — CBC
HCT: 46.2 % (ref 39.0–52.0)
HCT: 32.1 % — ABNORMAL LOW (ref 39.0–52.0)
HCT: 33.5 % — ABNORMAL LOW (ref 39.0–52.0)
Hemoglobin: 15.7 g/dL (ref 13.0–17.0)
Hemoglobin: 10.9 g/dL — ABNORMAL LOW (ref 13.0–17.0)
Hemoglobin: 11.3 g/dL — ABNORMAL LOW (ref 13.0–17.0)
MCH: 32.8 pg (ref 26.0–34.0)
MCH: 33 pg (ref 26.0–34.0)
MCH: 32.8 pg (ref 26.0–34.0)
MCHC: 34 g/dL (ref 30.0–36.0)
MCHC: 34 g/dL (ref 30.0–36.0)
MCHC: 33.7 g/dL (ref 30.0–36.0)
MCV: 96.5 fL (ref 80.0–100.0)
MCV: 97.3 fL (ref 80.0–100.0)
MCV: 97.4 fL (ref 80.0–100.0)
Platelets: 190 10*3/uL (ref 150–400)
Platelets: 118 10*3/uL — ABNORMAL LOW (ref 150–400)
Platelets: 122 10*3/uL — ABNORMAL LOW (ref 150–400)
RBC: 4.79 MIL/uL (ref 4.22–5.81)
RBC: 3.3 MIL/uL — ABNORMAL LOW (ref 4.22–5.81)
RBC: 3.44 MIL/uL — ABNORMAL LOW (ref 4.22–5.81)
RDW: 12.2 % (ref 11.5–15.5)
RDW: 12.2 % (ref 11.5–15.5)
RDW: 12.4 % (ref 11.5–15.5)
WBC: 10.1 10*3/uL (ref 4.0–10.5)
WBC: 10.5 10*3/uL (ref 4.0–10.5)
WBC: 6.9 10*3/uL (ref 4.0–10.5)
nRBC: 0 % (ref 0.0–0.2)
nRBC: 0 % (ref 0.0–0.2)
nRBC: 0 % (ref 0.0–0.2)

## 2019-06-25 LAB — DIC (DISSEMINATED INTRAVASCULAR COAGULATION)PANEL
D-Dimer, Quant: 3.48 ug/mL-FEU — ABNORMAL HIGH (ref 0.00–0.50)
Fibrinogen: 627 mg/dL — ABNORMAL HIGH (ref 210–475)
INR: 1.4 — ABNORMAL HIGH (ref 0.8–1.2)
Platelets: 118 10*3/uL — ABNORMAL LOW (ref 150–400)
Prothrombin Time: 16.3 seconds — ABNORMAL HIGH (ref 11.4–15.2)
Smear Review: NONE SEEN
aPTT: 39 seconds — ABNORMAL HIGH (ref 24–36)

## 2019-06-25 LAB — FIBRINOGEN: Fibrinogen: 618 mg/dL — ABNORMAL HIGH (ref 210–475)

## 2019-06-25 LAB — HEPARIN LEVEL (UNFRACTIONATED): Heparin Unfractionated: 0.32 IU/mL (ref 0.30–0.70)

## 2019-06-25 LAB — PROTIME-INR
INR: 1.3 — ABNORMAL HIGH (ref 0.8–1.2)
Prothrombin Time: 15.9 seconds — ABNORMAL HIGH (ref 11.4–15.2)

## 2019-06-25 LAB — BASIC METABOLIC PANEL
Anion gap: 11 (ref 5–15)
Anion gap: 10 (ref 5–15)
BUN: 20 mg/dL (ref 6–20)
BUN: 16 mg/dL (ref 6–20)
CO2: 24 mmol/L (ref 22–32)
CO2: 24 mmol/L (ref 22–32)
Calcium: 8.4 mg/dL — ABNORMAL LOW (ref 8.9–10.3)
Calcium: 8.3 mg/dL — ABNORMAL LOW (ref 8.9–10.3)
Chloride: 97 mmol/L — ABNORMAL LOW (ref 98–111)
Chloride: 99 mmol/L (ref 98–111)
Creatinine, Ser: 1.21 mg/dL (ref 0.61–1.24)
Creatinine, Ser: 0.97 mg/dL (ref 0.61–1.24)
GFR calc Af Amer: >60 mL/min (ref >60)
GFR calc Af Amer: >60 mL/min (ref >60)
GFR calc non Af Amer: >60 mL/min (ref >60)
GFR calc non Af Amer: >60 mL/min (ref >60)
Glucose, Bld: 142 mg/dL — ABNORMAL HIGH (ref 70–99)
Glucose, Bld: 152 mg/dL — ABNORMAL HIGH (ref 70–99)
Potassium: 3.8 mmol/L (ref 3.5–5.1)
Potassium: 3.8 mmol/L (ref 3.5–5.1)
Sodium: 132 mmol/L — ABNORMAL LOW (ref 135–145)
Sodium: 133 mmol/L — ABNORMAL LOW (ref 135–145)

## 2019-06-25 LAB — CREATININE, SERUM
Creatinine, Ser: 0.99 mg/dL (ref 0.61–1.24)
GFR calc Af Amer: >60 mL/min (ref >60)
GFR calc non Af Amer: >60 mL/min (ref >60)

## 2019-06-25 LAB — BPAM FFP
Blood Product Expiration Date: 202106292359
Blood Product Expiration Date: 202106292359
ISSUE DATE / TIME: 202106241411
ISSUE DATE / TIME: 202106241411
Unit Type and Rh: 5100
Unit Type and Rh: 5100

## 2019-06-25 LAB — SURGICAL PCR SCREEN
MRSA, PCR: NEGATIVE
Staphylococcus aureus: NEGATIVE

## 2019-06-25 LAB — APTT
aPTT: 63 seconds — ABNORMAL HIGH (ref 24–36)
aPTT: 39 seconds — ABNORMAL HIGH (ref 24–36)

## 2019-06-25 LAB — PLATELET COUNT: Platelets: 133 10*3/uL — ABNORMAL LOW (ref 150–400)

## 2019-06-25 LAB — HEMOGLOBIN AND HEMATOCRIT, BLOOD
HCT: 33.5 % — ABNORMAL LOW (ref 39.0–52.0)
Hemoglobin: 11.3 g/dL — ABNORMAL LOW (ref 13.0–17.0)

## 2019-06-25 LAB — PREPARE RBC (CROSSMATCH)

## 2019-06-25 LAB — MAGNESIUM
Magnesium: 2 mg/dL (ref 1.7–2.4)
Magnesium: 2.9 mg/dL — ABNORMAL HIGH (ref 1.7–2.4)

## 2019-06-25 SURGERY — INSERTION OF IMPLANTABLE LEFT VENTRICULAR ASSIST DEVICE
Anesthesia: General | Site: Chest

## 2019-06-25 MED ORDER — ACETAMINOPHEN 160 MG/5ML PO SOLN: 650.0000 mg | Freq: Once | ORAL | Status: AC

## 2019-06-25 MED ORDER — VASOPRESSIN 20 UNIT/ML IV SOLN
0.0100 [IU]/min | INTRAVENOUS | Status: DC
  Administered 2019-06-26: 0.02 [IU]/min via INTRAVENOUS
  Filled 2019-06-25: qty 2

## 2019-06-25 MED ORDER — MORPHINE SULFATE (PF) 2 MG/ML IV SOLN
1.0000 mg | INTRAVENOUS | Status: DC | PRN
  Administered 2019-06-25 (×3): 2 mg via INTRAVENOUS
  Administered 2019-06-25: 4 mg via INTRAVENOUS
  Administered 2019-06-25: 2 mg via INTRAVENOUS
  Administered 2019-06-25: 4 mg via INTRAVENOUS
  Administered 2019-06-25 – 2019-06-26 (×2): 2 mg via INTRAVENOUS
  Administered 2019-06-27: 1 mg via INTRAVENOUS
  Administered 2019-06-28: 2 mg via INTRAVENOUS
  Administered 2019-06-28 (×2): 4 mg via INTRAVENOUS
  Administered 2019-07-01 (×2): 2 mg via INTRAVENOUS
  Filled 2019-06-25: qty 2
  Filled 2019-06-25 (×7): qty 1
  Filled 2019-06-25 (×2): qty 2
  Filled 2019-06-25 (×3): qty 1
  Filled 2019-06-25: qty 2

## 2019-06-25 MED ORDER — HEMOSTATIC AGENTS (NO CHARGE) OPTIME
TOPICAL | Status: DC | PRN
  Administered 2019-06-25 (×2): 1 via TOPICAL

## 2019-06-25 MED ORDER — ALBUMIN HUMAN 5 % IV SOLN
250.0000 mL | INTRAVENOUS | Status: AC | PRN
  Administered 2019-06-25 – 2019-06-26 (×4): 12.5 g via INTRAVENOUS
  Filled 2019-06-25 (×2): qty 250

## 2019-06-25 MED ORDER — CHLORHEXIDINE GLUCONATE 0.12% ORAL RINSE (MEDLINE KIT)
15.0000 mL | Freq: Two times a day (BID) | OROMUCOSAL | Status: DC
  Administered 2019-06-25 – 2019-06-26 (×2): 15 mL via OROMUCOSAL

## 2019-06-25 MED ORDER — LACTATED RINGERS IV SOLN: INTRAVENOUS | Status: DC | PRN

## 2019-06-25 MED ORDER — SODIUM CHLORIDE 0.9 % IV SOLN
INTRAVENOUS | Status: DC | PRN
Start: 2019-06-25 — End: 2019-06-25

## 2019-06-25 MED ORDER — POTASSIUM CHLORIDE 10 MEQ/50ML IV SOLN
10.0000 meq | INTRAVENOUS | Status: AC
  Administered 2019-06-25 (×3): 10 meq via INTRAVENOUS

## 2019-06-25 MED ORDER — HEPARIN SODIUM (PORCINE) 1000 UNIT/ML IJ SOLN
INTRAMUSCULAR | Status: DC | PRN
  Administered 2019-06-25: 33000 [IU] via INTRAVENOUS

## 2019-06-25 MED ORDER — MAGNESIUM SULFATE 4 GM/100ML IV SOLN
4.0000 g | Freq: Once | INTRAVENOUS | Status: AC
  Administered 2019-06-25: 4 g via INTRAVENOUS

## 2019-06-25 MED ORDER — TRAMADOL HCL 50 MG PO TABS
50.0000 mg | ORAL_TABLET | ORAL | Status: DC | PRN
  Administered 2019-06-27 – 2019-06-30 (×4): 100 mg via ORAL
  Filled 2019-06-25 (×4): qty 2

## 2019-06-25 MED ORDER — ACETAMINOPHEN 160 MG/5ML PO SOLN
1000.0000 mg | Freq: Four times a day (QID) | ORAL | Status: AC
  Administered 2019-06-25 – 2019-06-26 (×2): 1000 mg
  Filled 2019-06-25 (×2): qty 40.6

## 2019-06-25 MED ORDER — SODIUM CHLORIDE (PF) 0.9 % IJ SOLN
OROMUCOSAL | Status: DC | PRN
  Administered 2019-06-25: 4 mL via TOPICAL

## 2019-06-25 MED ORDER — SODIUM CHLORIDE 0.9 % IV SOLN
INTRAVENOUS | Status: DC | PRN
  Administered 2019-06-25: 500 mL

## 2019-06-25 MED ORDER — MIDAZOLAM HCL 5 MG/5ML IJ SOLN
INTRAMUSCULAR | Status: DC | PRN
  Administered 2019-06-25 (×2): 2 mg via INTRAVENOUS
  Administered 2019-06-25 (×2): 1 mg via INTRAVENOUS
  Administered 2019-06-25 (×3): 2 mg via INTRAVENOUS

## 2019-06-25 MED ORDER — NOREPINEPHRINE 4 MG/250ML-% IV SOLN
0.0000 ug/min | INTRAVENOUS | Status: DC
  Administered 2019-06-26: 3 ug/min via INTRAVENOUS
  Administered 2019-06-27: 6 ug/min via INTRAVENOUS
  Filled 2019-06-25 (×3): qty 250

## 2019-06-25 MED ORDER — ASPIRIN 300 MG RE SUPP: 300.0000 mg | Freq: Every day | RECTAL | Status: DC

## 2019-06-25 MED ORDER — BUPIVACAINE HCL (PF) 0.5 % IJ SOLN
INTRAMUSCULAR | Status: AC
  Filled 2019-06-25: qty 30

## 2019-06-25 MED ORDER — ROCURONIUM BROMIDE 10 MG/ML (PF) SYRINGE
PREFILLED_SYRINGE | INTRAVENOUS | Status: DC | PRN
  Administered 2019-06-25 (×2): 50 mg via INTRAVENOUS
  Administered 2019-06-25: 40 mg via INTRAVENOUS

## 2019-06-25 MED ORDER — PROTAMINE SULFATE 10 MG/ML IV SOLN
INTRAVENOUS | Status: AC
  Filled 2019-06-25: qty 5

## 2019-06-25 MED ORDER — CALCIUM CHLORIDE 10 % IV SOLN
INTRAVENOUS | Status: AC
  Filled 2019-06-25: qty 10

## 2019-06-25 MED ORDER — DEXTROSE 50 % IV SOLN: 0.0000 mL | INTRAVENOUS | Status: DC | PRN

## 2019-06-25 MED ORDER — VANCOMYCIN HCL 1000 MG IV SOLR
INTRAVENOUS | Status: AC
  Filled 2019-06-25: qty 1000

## 2019-06-25 MED ORDER — PROTAMINE SULFATE 10 MG/ML IV SOLN
INTRAVENOUS | Status: DC | PRN
  Administered 2019-06-25: 280 mg via INTRAVENOUS

## 2019-06-25 MED ORDER — PROPOFOL 10 MG/ML IV BOLUS
INTRAVENOUS | Status: AC
  Filled 2019-06-25: qty 20

## 2019-06-25 MED ORDER — ALBUMIN HUMAN 5 % IV SOLN: INTRAVENOUS | Status: DC | PRN

## 2019-06-25 MED ORDER — SODIUM CHLORIDE 0.9 % IV SOLN
INTRAVENOUS | Status: AC
  Filled 2019-06-25: qty 1.2

## 2019-06-25 MED ORDER — BUPIVACAINE LIPOSOME 1.3 % IJ SUSP
INTRAMUSCULAR | Status: DC | PRN
  Administered 2019-06-25: 50 mL

## 2019-06-25 MED ORDER — SODIUM CHLORIDE 0.9 % IV SOLN: 250.0000 mL | INTRAVENOUS | Status: DC

## 2019-06-25 MED ORDER — BISACODYL 5 MG PO TBEC
10.0000 mg | DELAYED_RELEASE_TABLET | Freq: Every day | ORAL | Status: DC
  Administered 2019-06-26 – 2019-07-13 (×13): 10 mg via ORAL
  Filled 2019-06-25 (×18): qty 2

## 2019-06-25 MED ORDER — VANCOMYCIN HCL IN DEXTROSE 1-5 GM/200ML-% IV SOLN
1000.0000 mg | Freq: Two times a day (BID) | INTRAVENOUS | Status: AC
  Administered 2019-06-25 – 2019-06-26 (×3): 1000 mg via INTRAVENOUS
  Filled 2019-06-25 (×3): qty 200

## 2019-06-25 MED ORDER — EPINEPHRINE PF 1 MG/ML IJ SOLN
0.0000 ug/min | INTRAVENOUS | Status: DC
  Filled 2019-06-25: qty 4

## 2019-06-25 MED ORDER — EPINEPHRINE 1 MG/10ML IJ SOSY
PREFILLED_SYRINGE | INTRAMUSCULAR | Status: AC
  Filled 2019-06-25: qty 10

## 2019-06-25 MED ORDER — MIDAZOLAM HCL 2 MG/2ML IJ SOLN
INTRAMUSCULAR | Status: AC
  Filled 2019-06-25: qty 2

## 2019-06-25 MED ORDER — LACTATED RINGERS IV SOLN: INTRAVENOUS | Status: DC

## 2019-06-25 MED ORDER — FAMOTIDINE IN NACL 20-0.9 MG/50ML-% IV SOLN
20.0000 mg | Freq: Two times a day (BID) | INTRAVENOUS | Status: AC
  Administered 2019-06-25: 20 mg via INTRAVENOUS
  Filled 2019-06-25: qty 50

## 2019-06-25 MED ORDER — MIDAZOLAM HCL (PF) 10 MG/2ML IJ SOLN
INTRAMUSCULAR | Status: AC
  Filled 2019-06-25: qty 2

## 2019-06-25 MED ORDER — SODIUM CHLORIDE 0.9 % IV SOLN: 10.0000 mL/h | Freq: Once | INTRAVENOUS | Status: DC

## 2019-06-25 MED ORDER — ROCURONIUM BROMIDE 10 MG/ML (PF) SYRINGE
PREFILLED_SYRINGE | INTRAVENOUS | Status: AC
  Filled 2019-06-25: qty 10

## 2019-06-25 MED ORDER — LACTATED RINGERS IV SOLN
INTRAVENOUS | Status: DC | PRN
Start: 2019-06-25 — End: 2019-06-25

## 2019-06-25 MED ORDER — DEXMEDETOMIDINE HCL IN NACL 400 MCG/100ML IV SOLN
0.0000 ug/kg/h | INTRAVENOUS | Status: DC
  Administered 2019-06-25: 0.9 ug/kg/h via INTRAVENOUS
  Administered 2019-06-25: 0.7 ug/kg/h via INTRAVENOUS
  Administered 2019-06-25: 0.852 ug/kg/h via INTRAVENOUS
  Filled 2019-06-25: qty 200
  Filled 2019-06-25 (×2): qty 100

## 2019-06-25 MED ORDER — INSULIN REGULAR(HUMAN) IN NACL 100-0.9 UT/100ML-% IV SOLN
INTRAVENOUS | Status: DC
  Administered 2019-06-26: 0.2 [IU]/h via INTRAVENOUS
  Administered 2019-06-27: 1.6 [IU]/h via INTRAVENOUS
  Filled 2019-06-25: qty 100

## 2019-06-25 MED ORDER — ORAL CARE MOUTH RINSE
15.0000 mL | OROMUCOSAL | Status: DC
  Administered 2019-06-25 – 2019-06-26 (×8): 15 mL via OROMUCOSAL

## 2019-06-25 MED ORDER — HEPARIN SODIUM (PORCINE) 1000 UNIT/ML IJ SOLN
INTRAMUSCULAR | Status: AC
  Filled 2019-06-25: qty 1

## 2019-06-25 MED ORDER — ASPIRIN EC 325 MG PO TBEC
325.0000 mg | DELAYED_RELEASE_TABLET | Freq: Every day | ORAL | Status: DC
  Administered 2019-06-26 – 2019-07-04 (×8): 325 mg via ORAL
  Filled 2019-06-25 (×9): qty 1

## 2019-06-25 MED ORDER — PROTAMINE SULFATE 10 MG/ML IV SOLN
INTRAVENOUS | Status: AC
  Filled 2019-06-25: qty 25

## 2019-06-25 MED ORDER — PANTOPRAZOLE SODIUM 40 MG PO TBEC
40.0000 mg | DELAYED_RELEASE_TABLET | Freq: Every day | ORAL | Status: DC
  Administered 2019-06-27 – 2019-07-10 (×14): 40 mg via ORAL
  Filled 2019-06-25 (×14): qty 1

## 2019-06-25 MED ORDER — SODIUM CHLORIDE 0.9 % IV SOLN
600.0000 mg | Freq: Once | INTRAVENOUS | Status: AC
  Administered 2019-06-26: 600 mg via INTRAVENOUS
  Filled 2019-06-25: qty 600

## 2019-06-25 MED ORDER — ARTIFICIAL TEARS OPHTHALMIC OINT
TOPICAL_OINTMENT | OPHTHALMIC | Status: DC | PRN
  Administered 2019-06-25: 1 via OPHTHALMIC

## 2019-06-25 MED ORDER — ACETAMINOPHEN 650 MG RE SUPP
650.0000 mg | Freq: Once | RECTAL | Status: AC
  Administered 2019-06-25: 650 mg via RECTAL

## 2019-06-25 MED ORDER — OXYCODONE HCL 5 MG PO TABS
5.0000 mg | ORAL_TABLET | ORAL | Status: DC | PRN
  Administered 2019-06-26 – 2019-06-28 (×8): 10 mg via ORAL
  Administered 2019-06-28: 5 mg via ORAL
  Administered 2019-06-28 – 2019-07-09 (×17): 10 mg via ORAL
  Filled 2019-06-25 (×5): qty 2
  Filled 2019-06-25: qty 1
  Filled 2019-06-25 (×21): qty 2

## 2019-06-25 MED ORDER — VANCOMYCIN HCL 1000 MG IV SOLR
INTRAVENOUS | Status: DC | PRN
  Administered 2019-06-25: 1 g
  Administered 2019-06-25: 3 g
  Administered 2019-06-25: 1 g

## 2019-06-25 MED ORDER — FAMOTIDINE IN NACL 20-0.9 MG/50ML-% IV SOLN
INTRAVENOUS | Status: AC
  Administered 2019-06-25: 20 mg via INTRAVENOUS
  Filled 2019-06-25: qty 50

## 2019-06-25 MED ORDER — 0.9 % SODIUM CHLORIDE (POUR BTL) OPTIME
TOPICAL | Status: DC | PRN
  Administered 2019-06-25: 2000 mL
  Administered 2019-06-25: 1000 mL

## 2019-06-25 MED ORDER — THIAMINE HCL 100 MG/ML IJ SOLN
Freq: Once | INTRAVENOUS | Status: AC
  Filled 2019-06-25: qty 1000

## 2019-06-25 MED ORDER — CHLORHEXIDINE GLUCONATE 0.12 % MT SOLN
15.0000 mL | OROMUCOSAL | Status: AC
  Administered 2019-06-25: 15 mL via OROMUCOSAL

## 2019-06-25 MED ORDER — ACETAMINOPHEN 500 MG PO TABS
1000.0000 mg | ORAL_TABLET | Freq: Four times a day (QID) | ORAL | Status: AC
  Administered 2019-06-26 – 2019-06-30 (×18): 1000 mg via ORAL
  Filled 2019-06-25 (×19): qty 2

## 2019-06-25 MED ORDER — MIDAZOLAM HCL 2 MG/2ML IJ SOLN
2.0000 mg | INTRAMUSCULAR | Status: DC | PRN
  Administered 2019-06-25: 2 mg via INTRAVENOUS
  Administered 2019-06-25: 1 mg via INTRAVENOUS
  Administered 2019-06-25 (×2): 2 mg via INTRAVENOUS
  Administered 2019-06-25: 1 mg via INTRAVENOUS
  Administered 2019-06-25 (×2): 2 mg via INTRAVENOUS
  Filled 2019-06-25 (×6): qty 2

## 2019-06-25 MED ORDER — SODIUM CHLORIDE 0.9% FLUSH
3.0000 mL | Freq: Two times a day (BID) | INTRAVENOUS | Status: DC
  Administered 2019-06-26: 3 mL via INTRAVENOUS

## 2019-06-25 MED ORDER — SODIUM CHLORIDE 0.9 % IV SOLN: INTRAVENOUS | Status: DC

## 2019-06-25 MED ORDER — BISACODYL 10 MG RE SUPP
10.0000 mg | Freq: Every day | RECTAL | Status: DC
  Administered 2019-07-11 – 2019-07-12 (×2): 10 mg via RECTAL
  Filled 2019-06-25 (×2): qty 1

## 2019-06-25 MED ORDER — SODIUM CHLORIDE 0.9 % IV SOLN
1.5000 g | Freq: Two times a day (BID) | INTRAVENOUS | Status: AC
  Administered 2019-06-25 – 2019-06-27 (×4): 1.5 g via INTRAVENOUS
  Filled 2019-06-25 (×5): qty 1.5

## 2019-06-25 MED ORDER — PNEUMOCOCCAL VAC POLYVALENT 25 MCG/0.5ML IJ INJ: 0.5000 mL | INJECTION | INTRAMUSCULAR | Status: DC | PRN

## 2019-06-25 MED ORDER — EPINEPHRINE HCL 5 MG/250ML IV SOLN IN NS
0.0000 ug/min | INTRAVENOUS | Status: DC
  Administered 2019-06-26: 3 ug/min via INTRAVENOUS
  Filled 2019-06-25 (×2): qty 250

## 2019-06-25 MED ORDER — DOCUSATE SODIUM 100 MG PO CAPS
200.0000 mg | ORAL_CAPSULE | Freq: Every day | ORAL | Status: DC
  Administered 2019-06-27 – 2019-07-08 (×11): 200 mg via ORAL
  Filled 2019-06-25 (×13): qty 2

## 2019-06-25 MED ORDER — LACTATED RINGERS IV SOLN: 500.0000 mL | Freq: Once | INTRAVENOUS | Status: DC | PRN

## 2019-06-25 MED ORDER — ASPIRIN 81 MG PO CHEW
324.0000 mg | CHEWABLE_TABLET | Freq: Every day | ORAL | Status: DC
  Administered 2019-07-02: 324 mg
  Filled 2019-06-25 (×2): qty 4

## 2019-06-25 MED ORDER — FENTANYL 2500MCG IN NS 250ML (10MCG/ML) PREMIX INFUSION
0.0000 ug/h | INTRAVENOUS | Status: DC
  Administered 2019-06-25: 25 ug/h via INTRAVENOUS
  Filled 2019-06-25: qty 250

## 2019-06-25 MED ORDER — FLUCONAZOLE IN SODIUM CHLORIDE 400-0.9 MG/200ML-% IV SOLN
400.0000 mg | Freq: Once | INTRAVENOUS | Status: AC
  Administered 2019-06-26: 400 mg via INTRAVENOUS
  Filled 2019-06-25: qty 200

## 2019-06-25 MED ORDER — MAGNESIUM SULFATE 4 GM/100ML IV SOLN
INTRAVENOUS | Status: AC
  Filled 2019-06-25: qty 100

## 2019-06-25 MED ORDER — FENTANYL CITRATE (PF) 250 MCG/5ML IJ SOLN
INTRAMUSCULAR | Status: AC
  Filled 2019-06-25: qty 25

## 2019-06-25 MED ORDER — SODIUM CHLORIDE 0.9% FLUSH: 3.0000 mL | INTRAVENOUS | Status: DC | PRN

## 2019-06-25 MED ORDER — SODIUM CHLORIDE 0.45 % IV SOLN: INTRAVENOUS | Status: DC | PRN

## 2019-06-25 MED ORDER — ONDANSETRON HCL 4 MG/2ML IJ SOLN
4.0000 mg | Freq: Four times a day (QID) | INTRAMUSCULAR | Status: DC | PRN
  Administered 2019-07-08 – 2019-07-23 (×3): 4 mg via INTRAVENOUS
  Filled 2019-06-25 (×3): qty 2

## 2019-06-25 MED ORDER — VANCOMYCIN HCL 1000 MG IV SOLR
INTRAVENOUS | Status: AC
  Filled 2019-06-25: qty 3000

## 2019-06-25 SURGICAL SUPPLY — 116 items
ADAPTER CARDIO PERF ANTE/RETRO (ADAPTER) IMPLANT
ANTEGRADE CPLG (MISCELLANEOUS) IMPLANT
APPLICATOR TIP COSEAL (VASCULAR PRODUCTS) ×1 IMPLANT
BAG DECANTER FOR FLEXI CONT (MISCELLANEOUS) ×3 IMPLANT
BLADE CLIPPER SURG (BLADE) ×3 IMPLANT
BLADE STERNUM SYSTEM 6 (BLADE) ×3 IMPLANT
BLADE SURG 12 STRL SS (BLADE) ×3 IMPLANT
BLADE SURG 15 STRL LF DISP TIS (BLADE) IMPLANT
BLADE SURG 15 STRL SS (BLADE)
CANISTER SUCT 3000ML PPV (MISCELLANEOUS) ×3 IMPLANT
CANNULA ARTERIAL NVNT 3/8 20FR (MISCELLANEOUS) ×3 IMPLANT
CANNULA MC2 2 STG 36/46 NON-V (CANNULA) IMPLANT
CANNULA NON VENT 20FR 12 (CANNULA) ×1 IMPLANT
CANNULA SUMP PERICARDIAL (CANNULA) ×3 IMPLANT
CANNULA VENOUS 2 STG 34/46 (CANNULA) ×1
CANNULA VENOUS LOW PROF 34X46 (CANNULA) ×3 IMPLANT
CATH FOLEY 2WAY SLVR  5CC 14FR (CATHETERS) ×1
CATH FOLEY 2WAY SLVR 18FR 30CC (CATHETERS) ×1 IMPLANT
CATH FOLEY 2WAY SLVR 5CC 14FR (CATHETERS) ×2 IMPLANT
CATH HEART VENT LEFT (CATHETERS) IMPLANT
CATH ROBINSON RED A/P 18FR (CATHETERS) ×6 IMPLANT
CATH THORACIC 28FR (CATHETERS) IMPLANT
CHLORAPREP W/TINT 26 (MISCELLANEOUS) ×3 IMPLANT
CNTNR URN SCR LID CUP LEK RST (MISCELLANEOUS) IMPLANT
CONN ST 1/4X3/8  BEN (MISCELLANEOUS) ×2
CONN ST 1/4X3/8 BEN (MISCELLANEOUS) IMPLANT
CONT SPEC 4OZ STRL OR WHT (MISCELLANEOUS) ×1
DERMABOND ADVANCED (GAUZE/BANDAGES/DRESSINGS) ×1
DERMABOND ADVANCED .7 DNX12 (GAUZE/BANDAGES/DRESSINGS) IMPLANT
DRAIN CHANNEL 28F RND 3/8 FF (WOUND CARE) IMPLANT
DRAPE CV SPLIT W-CLR ANES SCRN (DRAPES) ×3 IMPLANT
DRAPE INCISE IOBAN 66X45 STRL (DRAPES) ×6 IMPLANT
DRAPE PERI GROIN 82X75IN TIB (DRAPES) ×3 IMPLANT
DRAPE SLUSH/WARMER DISC (DRAPES) ×3 IMPLANT
DRAPE TABLE BACK 80X90 (DRAPES) ×1 IMPLANT
DRSG AQUACEL AG ADV 3.5X14 (GAUZE/BANDAGES/DRESSINGS) ×3 IMPLANT
ELECT BLADE 4.0 EZ CLEAN MEGAD (MISCELLANEOUS) ×3
ELECT BLADE 6.5 EXT (BLADE) ×3 IMPLANT
ELECT CAUTERY BLADE 6.4 (BLADE) ×3 IMPLANT
ELECT REM PT RETURN 9FT ADLT (ELECTROSURGICAL) ×3
ELECTRODE BLDE 4.0 EZ CLN MEGD (MISCELLANEOUS) ×2 IMPLANT
ELECTRODE REM PT RTRN 9FT ADLT (ELECTROSURGICAL) ×2 IMPLANT
FELT TEFLON 6X6 (MISCELLANEOUS) ×3 IMPLANT
GAUZE SPONGE 4X4 12PLY STRL (GAUZE/BANDAGES/DRESSINGS) ×6 IMPLANT
GAUZE SPONGE 4X4 12PLY STRL LF (GAUZE/BANDAGES/DRESSINGS) ×3 IMPLANT
GLOVE BIO SURGEON STRL SZ 6.5 (GLOVE) ×1 IMPLANT
GLOVE BIO SURGEON STRL SZ7.5 (GLOVE) ×1 IMPLANT
GLOVE BIOGEL PI IND STRL 7.0 (GLOVE) IMPLANT
GLOVE BIOGEL PI IND STRL 9 (GLOVE) IMPLANT
GLOVE BIOGEL PI INDICATOR 7.0 (GLOVE) ×1
GLOVE BIOGEL PI INDICATOR 9 (GLOVE) ×1
GLOVE NEODERM STRL 7.5 LF PF (GLOVE) ×6 IMPLANT
GLOVE SURG NEODERM 7.5  LF PF (GLOVE) ×4
GOWN STRL REUS W/ TWL LRG LVL3 (GOWN DISPOSABLE) ×8 IMPLANT
GOWN STRL REUS W/TWL LRG LVL3 (GOWN DISPOSABLE) ×4
HEMOSTAT POWDER SURGIFOAM 1G (HEMOSTASIS) ×12 IMPLANT
HEMOSTAT SURGICEL 2X14 (HEMOSTASIS) IMPLANT
INSERT FOGARTY XLG (MISCELLANEOUS) IMPLANT
IV NS 1000ML (IV SOLUTION) ×1
IV NS 1000ML BAXH (IV SOLUTION) IMPLANT
KIT BASIN OR (CUSTOM PROCEDURE TRAY) ×3 IMPLANT
KIT LVAD HEARTMATE 3 W-CNTRL (Prosthesis & Implant Heart) IMPLANT
KIT LVAD HEARTMATE III W-CNTRL (Prosthesis & Implant Heart) ×1 IMPLANT
KIT SUCTION CATH 14FR (SUCTIONS) ×3 IMPLANT
KIT TURNOVER KIT B (KITS) ×3 IMPLANT
LEAD PACING MYOCARDI (MISCELLANEOUS) IMPLANT
LINE VENT (MISCELLANEOUS) ×1 IMPLANT
NS IRRIG 1000ML POUR BTL (IV SOLUTION) ×16 IMPLANT
PACK OPEN HEART (CUSTOM PROCEDURE TRAY) ×3 IMPLANT
PAD ARMBOARD 7.5X6 YLW CONV (MISCELLANEOUS) ×6 IMPLANT
PAD DEFIB R2 (MISCELLANEOUS) ×3 IMPLANT
POSITIONER HEAD DONUT 9IN (MISCELLANEOUS) ×3 IMPLANT
PUNCH AORTIC ROTATE 4.5MM 8IN (MISCELLANEOUS) ×3 IMPLANT
SEALANT SURG COSEAL 8ML (VASCULAR PRODUCTS) ×3 IMPLANT
SET CARDIOPLEGIA MPS 5001102 (MISCELLANEOUS) ×1 IMPLANT
SHEATH AVANTI 11CM 5FR (SHEATH) IMPLANT
SUT ETHIBOND 2 0 SH (SUTURE) ×5
SUT ETHIBOND 2 0 SH 36X2 (SUTURE) ×10 IMPLANT
SUT ETHIBOND NAB MH 2-0 36IN (SUTURE) ×38 IMPLANT
SUT ETHILON 3 0 FSL (SUTURE) ×1 IMPLANT
SUT MNCRL AB 3-0 PS2 18 (SUTURE) ×1 IMPLANT
SUT PROLENE 3 0 RB 1 (SUTURE) IMPLANT
SUT PROLENE 3 0 SH DA (SUTURE) ×6 IMPLANT
SUT PROLENE 4 0 RB 1 (SUTURE) ×5
SUT PROLENE 4-0 RB1 .5 CRCL 36 (SUTURE) ×8 IMPLANT
SUT PROLENE 5 0 C1 (SUTURE) IMPLANT
SUT SILK  1 MH (SUTURE) ×8
SUT SILK 1 MH (SUTURE) ×8 IMPLANT
SUT SILK 1 TIES 10X30 (SUTURE) ×3 IMPLANT
SUT SILK 2 0 SH CR/8 (SUTURE) ×7 IMPLANT
SUT STEEL 6MS V (SUTURE) ×6 IMPLANT
SUT STEEL SZ 6 DBL 3X14 BALL (SUTURE) ×3 IMPLANT
SUT TEM PAC WIRE 2 0 SH (SUTURE) ×2 IMPLANT
SUT VIC AB 1 CTX 36 (SUTURE) ×2
SUT VIC AB 1 CTX36XBRD ANBCTR (SUTURE) ×4 IMPLANT
SUT VIC AB 2-0 CT1 27 (SUTURE) ×1
SUT VIC AB 2-0 CT1 TAPERPNT 27 (SUTURE) IMPLANT
SUT VIC AB 2-0 CTX 27 (SUTURE) ×6 IMPLANT
SUT VIC AB 2-0 CTX 36 (SUTURE) ×1 IMPLANT
SUT VIC AB 3-0 SH 8-18 (SUTURE) ×3 IMPLANT
SUT VIC AB 3-0 X1 27 (SUTURE) ×6 IMPLANT
SYR 50ML LL SCALE MARK (SYRINGE) ×3 IMPLANT
SYR 50ML SLIP (SYRINGE) ×1 IMPLANT
SYR BULB IRRIG 60ML STRL (SYRINGE) ×2 IMPLANT
SYSTEM SAHARA CHEST DRAIN ATS (WOUND CARE) ×4 IMPLANT
TAPE CLOTH SURG 4X10 WHT LF (GAUZE/BANDAGES/DRESSINGS) ×3 IMPLANT
TAPE PAPER 2X10 WHT MICROPORE (GAUZE/BANDAGES/DRESSINGS) ×1 IMPLANT
TOWEL GREEN STERILE (TOWEL DISPOSABLE) ×3 IMPLANT
TRAY CATH LUMEN 1 20CM STRL (SET/KITS/TRAYS/PACK) ×3 IMPLANT
TRAY FOLEY SLVR 16FR TEMP STAT (SET/KITS/TRAYS/PACK) ×3 IMPLANT
TUBE CONNECTING 12X1/4 (SUCTIONS) ×3 IMPLANT
TUBE SUCT INTRACARD DLP 20F (MISCELLANEOUS) ×1 IMPLANT
UNDERPAD 30X36 HEAVY ABSORB (UNDERPADS AND DIAPERS) ×3 IMPLANT
VENT LEFT HEART 12002 (CATHETERS)
WATER STERILE IRR 1000ML POUR (IV SOLUTION) ×6 IMPLANT
YANKAUER SUCT BULB TIP NO VENT (SUCTIONS) ×3 IMPLANT

## 2019-06-25 NOTE — Anesthesia Postprocedure Evaluation (Signed)
Anesthesia Post Note  Patient: Derrel Moore Sundell  Procedure(s) Performed: INSERTION OF IMPLANTABLE LEFT VENTRICULAR ASSIST DEVICE USING THORATEC HEARTMATE 3 (N/A Chest) TRANSESOPHAGEAL ECHOCARDIOGRAM (TEE) (N/A )     Patient location during evaluation: SICU Anesthesia Type: General Level of consciousness: sedated Pain management: pain level controlled Vital Signs Assessment: post-procedure vital signs reviewed and stable Respiratory status: patient remains intubated per anesthesia plan Cardiovascular status: stable Postop Assessment: no apparent nausea or vomiting Anesthetic complications: no   No complications documented.  Last Vitals:  Vitals:   06/25/19 1516 06/25/19 1530  BP: 91/73 110/74  Pulse: 90 90  Resp:  (!) 0  Temp:  37.6 C  SpO2: 93% (!) 89%    Last Pain:  Vitals:   06/25/19 1400  TempSrc: Core  PainSc:                  Tiajuana Amass

## 2019-06-25 NOTE — Transfer of Care (Signed)
Immediate Anesthesia Transfer of Care Note  Patient: Ithiel Liebler Dayrit  Procedure(s) Performed: INSERTION OF IMPLANTABLE LEFT VENTRICULAR ASSIST DEVICE USING THORATEC HEARTMATE 3 (N/A Chest) TRANSESOPHAGEAL ECHOCARDIOGRAM (TEE) (N/A )  Patient Location: SICU  Anesthesia Type:General  Level of Consciousness: sedated and Patient remains intubated per anesthesia plan  Airway & Oxygen Therapy: Patient remains intubated per anesthesia plan and Patient placed on Ventilator (see vital sign flow sheet for setting)  Post-op Assessment: Report given to RN and Post -op Vital signs reviewed and stable  Post vital signs: Reviewed and stable  Last Vitals:  Vitals Value Taken Time  BP 105/89 06/25/19 1230  Temp 36.9 C 06/25/19 1231  Pulse 88 06/25/19 1231  Resp 4 06/25/19 1231  SpO2 97 % 06/25/19 1231  Vitals shown include unvalidated device data.  Last Pain:  Vitals:   06/25/19 0400  TempSrc: Core  PainSc: 0-No pain         Complications: No complications documented.

## 2019-06-25 NOTE — Progress Notes (Signed)
Pharmacy Antibiotic Note  Aaron [name] is a 54 y.o. male admitted on 06/17/2019 with surgical prophylaxis.  Pharmacy has been consulted for vancomycin dosing.  S/p LVAD implantation on 6/24. Scr 0.97 (CrCl 97 mL/min). WBC 10.5, afebrile. Received pre-op dose of 1500 mg x1 on 6/24@0809 .   Plan: Vancomycin 1g IV every 12 hours for at least 48 hours post-LVAD Monitor Scr, cx results, clinical pic, and vanc levels if continued   Height: 5\' 9"  (175.3 cm) Weight: 93.9 kg (207 lb 0.2 oz) IBW/kg (Calculated) : 70.7  Temp (24hrs), Avg:99.1 F (37.3 C), Min:97.5 F (36.4 C), Max:101.1 F (38.4 C)  Recent Labs  Lab 06/21/19 0618 06/21/19 0618 06/22/19 0403 06/23/19 0457 06/24/19 0448 06/25/19 0345 06/25/19 1233  WBC 10.3   < > 10.1 8.8 8.8 10.1 10.5  CREATININE 1.36*  --  1.26* 1.13 1.23 1.21  --    < > = values in this interval not displayed.    Estimated Creatinine Clearance: 78.1 mL/min (by C-G formula based on SCr of 1.21 mg/dL).    Allergies  Allergen Reactions  . Codeine Hives    Antimicrobials this admission: Vancomycyin 6/24 >>  Cefuroxime 6/24 >>   Dose adjustments this admission: N/A  Microbiology results: 6/17 MRSA PCR: neg   Thank you for allowing pharmacy to be a part of this patient's care.  Antonietta Jewel, PharmD, [place] Clinical Pharmacist  Phone: [phone] 06/25/2019 2:30 PM  Please check AMION for all [facility] phone numbers After 10:00 PM, call [place] [phone]

## 2019-06-25 NOTE — H&P (Signed)
History and Physical Interval Note:  06/25/2019 7:12 AM  Aaron [name]  has presented today for surgery, with the diagnosis of cardiogenic shock.  The various methods of treatment have been discussed with the patient and family. After consideration of risks, benefits and other options for treatment, the patient has consented to PROCEDURE: HeartMate 3 Left Ventricular Assist Device implantation as a surgical intervention.  The patient's history has been reviewed, patient examined, no change in status, stable for surgery.  I have reviewed the patient's chart and labs.  Questions were answered to the patient's satisfaction.     Wonda Olds

## 2019-06-25 NOTE — Progress Notes (Addendum)
Advanced Heart Failure Rounding Note  PCP-Cardiologist: No primary care provider on file.   Subjective:   S/P HMIII today   On multiple drips milrinone + vasopressin. Intubated   Swan #s 24/15 (20) CO 4.4  CI 2.1    LVAD Interrogation HM III: Speed: 5100  Flow: 4.1 PI:3.1 Power:4. Objective:   Weight Range: 93.9 kg Body mass index is 30.57 kg/m.   Vital Signs:   Temp:  [97.5 F (36.4 C)-101.1 F (38.4 C)] 99.1 F (37.3 C) (06/24 1400) Pulse Rate:  [55-102] 88 (06/24 1230) Resp:  [0-31] 0 (06/24 1400) BP: (82-131)/(54-112) 87/70 (06/24 1400) SpO2:  [73 %-100 %] 97 % (06/24 1230) Arterial Line BP: (87-107)/(66-91) 90/71 (06/24 1400) FiO2 (%):  [50 %] 50 % (06/24 1300) Weight:  [93.9 kg] 93.9 kg (06/24 0500) Last BM Date: 06/24/19  Weight change: Filed Weights   06/22/19 0359 06/23/19 0500 06/25/19 0500  Weight: 96 kg 92.4 kg 93.9 kg    Intake/Output:   Intake/Output Summary (Last 24 hours) at 06/25/2019 1416 Last data filed at 06/25/2019 1400 Gross per 24 hour  Intake 4964.61 ml  Output 3070 ml  Net 1894.61 ml      Physical Exam  Physical Exam: GENERAL: Intubated/sedated   HEENT: ETT  NECK: Supple, JVP hard to assess with lines.  2+ bilaterally, no bruits.  No lymphadenopathy or thyromegaly appreciated.   CARDIAC:  Mechanical heart sounds with LVAD hum present.  LUNGS:  Clear to auscultation bilaterally.  ABDOMEN:  Soft, round, nontender, no bowel sounds     LVAD exit site: Driveline dressing in place.  EXTREMITIES:  Warm and dry, no cyanosis, clubbing, rash or edema  NEUROLOGIC:  Intubated/sedated  Telemetry   NSR with PVCs    Labs    CBC Recent Labs    06/25/19 0345 06/25/19 0345 06/25/19 1029 06/25/19 1029 06/25/19 1233 06/25/19 1242  WBC 10.1  --   --   --  10.5  --   HGB 15.7   < > 11.3*   < > 10.9* 11.2*  HCT 46.2   < > 33.5*   < > 32.1* 33.0*  MCV 96.5  --   --   --  97.3  --   PLT 190   < > 133*  --  118*  118*  --    <  > = values in this interval not displayed.   Basic Metabolic Panel Recent Labs    06/25/19 0345 06/25/19 0345 06/25/19 1233 06/25/19 1242  NA 132*   < > 133* 137  K 3.8   < > 3.8 3.8  CL 97*  --  99  --   CO2 24  --  24  --   GLUCOSE 142*  --  152*  --   BUN 20  --  16  --   CREATININE 1.21  --  0.97  --   CALCIUM 8.4*  --  8.3*  --   MG  --   --  2.0  --    < > = values in this interval not displayed.   Liver Function Tests Recent Labs    06/23/19 1430  AST 123*  ALT 125*  ALKPHOS 57  BILITOT 0.9  PROT 6.1*  ALBUMIN 2.5*   No results for input(s): LIPASE, AMYLASE in the last 72 hours. Cardiac Enzymes No results for input(s): CKTOTAL, CKMB, CKMBINDEX, TROPONINI in the last 72 hours.  BNP: BNP (last 3 results) Recent Labs  06/17/19 2023 06/18/19 0119  BNP 751.1* 1,068.5*    ProBNP (last 3 results) No results for input(s): PROBNP in the last 8760 hours.   D-Dimer Recent Labs    06/25/19 1233  DDIMER 3.48*   Hemoglobin A1C No results for input(s): HGBA1C in the last 72 hours. Fasting Lipid Panel No results for input(s): CHOL, HDL, LDLCALC, TRIG, CHOLHDL, LDLDIRECT in the last 72 hours. Thyroid Function Tests No results for input(s): TSH, T4TOTAL, T3FREE, THYROIDAB in the last 72 hours.  Invalid input(s): FREET3  Other results:   Imaging    DG Chest Port 1 View  Result Date: 06/25/2019 CLINICAL DATA:  Status post LVAD insertion. Chronic systolic congestive heart failure. EXAM: PORTABLE CHEST 1 VIEW 12:34 p.m. COMPARISON:  Chest x-ray dated 06/25/2019 at 2:32 a.m. FINDINGS: Endotracheal tube tip in good position 4.7 cm above the carina. NG tube tip in the fundus of the stomach. Swan-Ganz catheter is in an artery to the right lower lobe. Two chest tubes in place. Unchanged cardiomegaly.  No pneumothorax. New slight haziness in the right upper lobe which may represent mild edema. The lungs are otherwise clear. IMPRESSION: 1. New slight haziness in the  right upper lobe which may represent mild edema. 2. No pneumothorax. 3. Endotracheal tube in good position. Swan-Ganz catheter is in an artery to the right lower lobe. Electronically Signed   By: Lorriane Shire M.D.   On: 06/25/2019 13:12   DG CHEST PORT 1 VIEW  Result Date: 06/25/2019 CLINICAL DATA:  Intra-aortic balloon pump. EXAM: PORTABLE CHEST 1 VIEW COMPARISON:  Radiograph yesterday. FINDINGS: Tip of the intra-aortic balloon pump projects over the upper descending aorta at the level of T6. This is unchanged from prior exam. Right internal jugular Swan-Ganz catheter tip in the region of the distal main pulmonary outflow tract. Cardiomegaly is stable. Improved pulmonary edema, residual asymmetric greater on the right. Trace fluid in the right minor fissure without large subpulmonic effusion. No pneumothorax. IMPRESSION: 1. Unchanged positioning of the intra-aortic balloon pump with tip over the descending aorta at the level of T6. 2. Stable positioning of right internal jugular Swan-Ganz catheter. 3. Improved pulmonary edema, mild residual greater on the right. Stable cardiomegaly. Electronically Signed   By: Keith Rake M.D.   On: 06/25/2019 02:58   Port CXR  Result Date: 06/24/2019 CLINICAL DATA:  Central line placement. EXAM: PORTABLE CHEST 1 VIEW COMPARISON:  06/22/2019 FINDINGS: Right internal jugular Swan-Ganz catheter tip at central right pulmonary artery. Numerous leads and wires project over the chest. Midline trachea. Cardiomegaly accentuated by AP portable technique. No pleural effusion or pneumothorax. Interstitial prominence is similar. Somewhat more confluent left perihilar and right upper lobe airspace disease. IMPRESSION: Swan-Ganz catheter tip at central right pulmonary artery. No pneumothorax. Slightly worsened aeration, which is favored to represent progressive pulmonary edema. Electronically Signed   By: Abigail Miyamoto M.D.   On: 06/24/2019 16:35     Medications:      Scheduled Medications: . [START ON 06/26/2019] acetaminophen  1,000 mg Oral Q6H   Or  . [START ON 06/26/2019] acetaminophen (TYLENOL) oral liquid 160 mg/5 mL  1,000 mg Per Tube Q6H  . [START ON 06/26/2019] aspirin EC  325 mg Oral Daily   Or  . [START ON 06/26/2019] aspirin  324 mg Per Tube Daily   Or  . [START ON 06/26/2019] aspirin  300 mg Rectal Daily  . aspirin  81 mg Oral Daily  . atorvastatin  80 mg Oral Daily  . [  START ON 06/26/2019] bisacodyl  10 mg Oral Daily   Or  . [START ON 06/26/2019] bisacodyl  10 mg Rectal Daily  . chlorhexidine gluconate (MEDLINE KIT)  15 mL Mouth Rinse BID  . Chlorhexidine Gluconate Cloth  6 each Topical Daily  . clonazePAM  0.5 mg Oral BID  . digoxin  0.125 mg Oral Daily  . [START ON 06/26/2019] docusate sodium  200 mg Oral Daily  . feeding supplement (ENSURE ENLIVE)  237 mL Oral BID BM  . folic acid  1 mg Oral Daily  . furosemide  80 mg Intravenous BID  . mouth rinse  15 mL Mouth Rinse BID  . mouth rinse  15 mL Mouth Rinse 10 times per day  . multivitamin with minerals  1 tablet Oral Daily  . [START ON 06/27/2019] pantoprazole  40 mg Oral Daily  . sacubitril-valsartan  1 tablet Oral BID  . sodium chloride flush  10-40 mL Intracatheter Q12H  . sodium chloride flush  3 mL Intravenous Q12H  . sodium chloride flush  3 mL Intravenous Q12H  . [START ON 06/26/2019] sodium chloride flush  3 mL Intravenous Q12H  . spironolactone  25 mg Oral Daily  . thiamine  100 mg Oral Daily    Infusions: . sodium chloride Stopped (06/25/19 1313)  . sodium chloride    . sodium chloride Stopped (06/19/19 1443)  . sodium chloride Stopped (06/18/19 0953)  . sodium chloride Stopped (06/19/19 1446)  . sodium chloride    . sodium chloride    . sodium chloride    . sodium chloride    . [START ON 06/26/2019] sodium chloride    . sodium chloride 10 mL/hr at 06/25/19 1220  . albumin human    . amiodarone 30 mg/hr (06/25/19 0814)  . cefUROXime (ZINACEF)  IV    .  dexmedetomidine (PRECEDEX) IV infusion 0.9 mcg/kg/hr (06/25/19 1400)  . epinephrine 4 mcg/min (06/25/19 1220)  . famotidine (PEPCID) IV Stopped (06/25/19 1309)  . [START ON 06/26/2019] fluconazole (DIFLUCAN) IV    . heparin 1,350 Units/hr (06/25/19 0700)  . insulin 2.2 mL/hr at 06/25/19 1400  . lactated ringers    . lactated ringers    . lactated ringers 20 mL/hr at 06/25/19 1400  . [COMPLETED] magnesium sulfate 20 mL/hr at 06/25/19 1400  . milrinone 0.125 mcg/kg/min (06/25/19 1220)  . norepinephrine (LEVOPHED) Adult infusion 4 mcg/min (06/25/19 1129)  . norepinephrine (LEVOPHED) Adult infusion Stopped (06/25/19 1245)  . potassium chloride 10 mEq (06/25/19 1415)  . [START ON 06/26/2019] rifampin (RIFADIN) IVPB    . vancomycin    . vasopressin (PITRESSIN) infusion - *FOR SHOCK*      PRN Medications: sodium chloride, sodium chloride, sodium chloride, sodium chloride, sodium chloride, sodium chloride, acetaminophen, albumin human, ALPRAZolam, dextrose, fentaNYL (SUBLIMAZE) injection, lactated ringers, midazolam, morphine injection, morphine injection, nitroGLYCERIN, ondansetron (ZOFRAN) IV, ondansetron (ZOFRAN) IV, ondansetron (ZOFRAN) IV, oxyCODONE, pneumococcal 23 valent vaccine, sodium chloride flush, sodium chloride flush, sodium chloride flush, [START ON 06/26/2019] sodium chloride flush, traMADol, traZODone    Patient Profile   Aaron [name] --post MI cardiogenic shock/ optimization prior to anticipated CABG, at the request of Dr. Marlou Porch, Cardiology.    Assessment/Plan   1. S/P HMIII LVAD implant 06/25/19 On multiple drips + intubated.  Watch for bleeding.   2. Acute Anterolateral STEMI/ Multivessel CAD - Emergent cath showed severe multivessel disease w/ 75% mid- distal LM, 100% prox LAD, not amendable to PCI (unable to cross w/ guide wire, probable CTO),  100% prox LCx (probable CTO) and 100% ostial RCA (probable CTO). There were mild to moderate collaterals right to left to  the LAD, minimal right to right collaterals an minimal left to left collaterals. LVEF 25% - Hs trop peaked to 17,602>27,000. - No targets for CABG / PCI.   -S/P HMIII VAD - Plan for ramp ECHO next week.    3.  Acute Systolic Heart Failure --->Cardiogenic Shock - 2/2 acute MI. LVEF by LVG 25% - IABP out 6/18 but replaced 6/23. Remove in OR on 6/24  - HMIII LVAD  Placed 6/24  4. NSVT - improved - keep K> 4.0 Mg >2.0  - continue amio - Supp K - low threshold to put IABP back  5  Hypertension  - reported h/o untreated HTN prior to admit - Controlled on current regimen   6. Tobacco Abuse - 30 pack years  - smoking cessation advised  - Will need to quit if to be considered for heart transplant in the future   7. Elevated LFTs due to shock liver - improved  8 ETOH  - Drinks 2 mixed drinks a night + more on the weekend.    9. Renal mass - seen on CT -  Concern for RCC.  - MRI abdomen- 1.8 cm right renal lesion, most consistent with renal cell carcinoma. No evidence of renal vein involvement or nodal metastasis. - Discussed w/ Urology and R RCC is small and will just require surveillance. - ok to proceed with VAD from their standpoint.   10  Thrombocytopenia - PLTs on admit 172   -118   11. Hyponatremia  - Sodium 133  11. Sleep Apnea - frequent apnea at night  - needs formal sleep study   - order CPAP while inpatient   Length of Stay: Anderson, NP  06/25/2019, 2:16 PM  Advanced Heart Failure Team Pager [phone] (M-F; 7a - 4p)  Please contact [facility] for night-coverage after hours (4p -7a ) and weekends on [email or web address]  Agree.  He is back from the OR after VAD placement. IABP out. Remains intubated. On NE, milrinone, epi and iNO.  Chest tube drainage ok. Swan #s and VAD parameters look good.   General:  Intubated sedated HEENT: normal  Neck: supple. RIJ swan.  Carotids 2+ bilat; no bruits. No lymphadenopathy or thryomegaly appreciated. Cor: LVAD  hum. Sternal dressing and CTs ok.   Lungs: Coarse Abdomen: obese soft, nontender, + distended. No hepatosplenomegaly. No bruits or masses. Good bowel sounds. Driveline site clean. Anchor in place.  Extremities: no cyanosis, clubbing, rash. Warm no edema  Neuro: alert & oriented x 3. No focal deficits. Moves all 4 without problem   He looks good immediately post-op. Hemodynamics ok. VAD interrogated personally. Parameters stable. Wean iNO this afternoon. Likely extubate in am.   CRITICAL CARE Performed by: Glori Bickers  Total critical care time: 35 minutes  Critical care time was exclusive of separately billable procedures and treating other patients.  Critical care was necessary to treat or prevent imminent or life-threatening deterioration.  Critical care was time spent personally by me (independent of midlevel providers or residents) on the following activities: development of treatment plan with patient and/or surrogate as well as nursing, discussions with consultants, evaluation of patient's response to treatment, examination of patient, obtaining history from patient or surrogate, ordering and performing treatments and interventions, ordering and review of laboratory studies, ordering and review of radiographic studies, pulse oximetry and re-evaluation of patient's condition.  Glori Bickers, MD  4:09 PM

## 2019-06-25 NOTE — Progress Notes (Addendum)
Pt placed on vent in OR and transported to [facility] on vent with no complications. Pt stable throughout. Pt arrived to [place] at 1225.

## 2019-06-25 NOTE — Brief Op Note (Signed)
06/17/2019 - 06/25/2019  11:54 AM  PATIENT:  Aaron [name]  54 y.o. male  PRE-OPERATIVE DIAGNOSIS:  Systolic Heart Failure, Ischemic cardiomyopathy  POST-OPERATIVE DIAGNOSIS:  same  PROCEDURE:  Procedure(s): INSERTION OF IMPLANTABLE LEFT VENTRICULAR ASSIST DEVICE USING THORATEC HEARTMATE 3 (N/A) TRANSESOPHAGEAL ECHOCARDIOGRAM (TEE) (N/A)  SURGEON:  Surgeon(s) and Role:    * Wonda Olds, MD - Primary    * Ivin Poot, MD - Assisting  PHYSICIAN ASSISTANT: n/a  ASSISTANTS: staff   ANESTHESIA:   general   BLOOD ADMINISTERED:per perfusion record  DRAINS: 4 Chest Tube(s) in the mediastinum and left pleural space   LOCAL MEDICATIONS USED:  OTHER Exparel solution  SPECIMEN:  Source of Specimen:  LV apical core  DISPOSITION OF SPECIMEN:  PATHOLOGY  COUNTS:  YES  TOURNIQUET:  * No tourniquets in log *  DICTATION: .Note written in EPIC  PLAN OF CARE: Admit to inpatient   PATIENT DISPOSITION:  ICU - intubated and critically ill.   Delay start of Pharmacological VTE agent (>24hrs) due to surgical blood loss or risk of bleeding: yes

## 2019-06-25 NOTE — Progress Notes (Signed)
  Echocardiogram Echocardiogram Transesophageal has been performed.  Darlina Sicilian M 06/25/2019, 8:46 AM

## 2019-06-25 NOTE — Op Note (Signed)
CARDIOTHORACIC SURGERY OPERATIVE NOTE  Date of Procedure: 06/25/2019  Preoperative Diagnosis: Ischemic cardiomyopathy with Class IV heart failure Postoperative Diagnosis: Same  Procedure:   HeartMate 3 Left ventricular assist device implantation  Surgeon: B. Murvin Natal, MD  Assistant: Ivin Poot, MD  Anesthesia: get  Operative Findings:  Severely reduced left ventricular systolic function  Moderately reduced right ventricular function  BRIEF CLINICAL NOTE AND INDICATIONS FOR SURGERY  54 yo man presented last week with STEMI. He underwent LHC and was found to have severe CAD and LV systolic function. He was considered a poor candidate for coronary revascularization due to lack of target vessels. He presents for [facility] LVAD implantation after thorough workup.    DETAILS OF THE OPERATIVE PROCEDURE  Preparation:  The patient is brought to the operating room on the above mentioned date and central monitoring was established by the anesthesia team including placement of Swan-Ganz catheter and radial arterial line. The patient is placed in the supine position on the operating table.  Intravenous antibiotics are administered. General endotracheal anesthesia is induced uneventfully. A Foley catheter is placed.  Baseline transesophageal echocardiogram was performed.  Findings were notable for severely reduced LV function and competent aortic, mitral, and tricuspid valve function.   The patient's chest, abdomen, both groins, and both lower extremities are prepared and draped in a sterile manner. A time out procedure is performed.   Surgical Approach:  A median sternotomy incision was performed, and the chest is entered safely. Attention is 1st turn to creation of a pump pocket towards the LV apex by dividing muscular diaphragmatic attachments laterally. Driveline tunnelers were passed from inside the mediastinum inferiorly and then to the left prior to heparinization. The pericardium  is opened and pericardial tacking sutures were placed to create a pericardial well. CO2 was blown into the field. Full dose heparin was delivered intravenously. The ascending aorta is normal in appearance. The ascending aorta and the right atrium are cannulated for cardiopulmonary bypass.  Adequate heparinization is verified. Cardiopulmonary bypass was begun and the patient's temperature is kept warm; the lungs inflated at very low tidal volumes to introduce inhaled nitric oxide systemically.    LVAD Insertion: Moist packs were placed behind the LV to elevate the LV apex into the wound. The LV apical core is created sharply and angled directly towards the mitral apparatus. The edges of the incised LV are inspected for hemostasis and cauterized as needed. Circumferential sutures of 2-0 Ethibond with pledgets are placed from the epicardium, into the LV, and back into the epicardium in a mattressed fashion. These are then placed through the sewing cuff of the LVAD inflow housing. The sutures are tied down, and the pump is brought onto the field and inspected. The inflow cannula is secured within the housing and the outflow graft is clamped. The driveline is tunneled exteriorly using the pre-placed tunnelers.  The outflow graft is then measured to appropriate length. A partial occlusion clamp is placed on the ascending aorta and aortotomy is made anteriorly. The outflow graft is anastomosed to the ascending aorta with running 4-0 prolene. The side-biting clamp is released and the pump is turned on at 4,000 rpm to deair the LVAD. Full pulmonary ventilation is resumed. The patient is weaned from CPB as the LVAD flows are increased to 5100 rpm gradually.    Procedure Completion:   Epicardial pacing wires are fixed to the right ventricular outflow tract and to the right atrial appendage. The patient is rewarmed to 37C temperature.  The patient is weaned and disconnected from cardiopulmonary bypass.  The patient's  rhythm at separation from bypass was NSR.  The patient was weaned from cardiopulmonary bypass with moderate inotropic support.  Followup transesophageal echocardiogram performed after separation from bypass revealed no significant changes from the preoperative exam and good pump inflow positioning.   The aortic and venous cannula were removed uneventfully. Protamine was administered to reverse the anticoagulation. The mediastinum and pleural space were inspected for hemostasis and irrigated with saline solution. The mediastinum and bilateral pleural space were drained using fluted chest tubes placed through separate stab incisions inferiorly.  The soft tissues anterior to the aorta were reapproximated loosely. The sternum is closed with double strength sternal wire. The soft tissues anterior to the sternum were closed in multiple layers and the skin is closed with a running subcuticular skin closure.   Disposition:   The patient tolerated the procedure well and is transported to the surgical intensive care in stable condition. There are no intraoperative complications. All sponge instrument and needle counts are verified correct at completion of the operation.  Treniece Holsclaw Z. Orvan Seen, [place]

## 2019-06-25 NOTE — Anesthesia Procedure Notes (Addendum)
Arterial Line Insertion Start/End6/24/2021 8:05 AM, 06/25/2019 8:10 AM Performed by: Suzette Battiest, MD, anesthesiologist  Patient location: OR. Preanesthetic checklist: patient identified, IV checked, site marked, risks and benefits discussed, surgical consent, monitors and equipment checked, pre-op evaluation, timeout performed and anesthesia consent Lidocaine 1% used for infiltration radial was placed Catheter size: 20 G Hand hygiene performed  and maximum sterile barriers used   Attempts: 2 Procedure performed without using ultrasound guided technique. Following insertion, dressing applied and Biopatch. Post procedure assessment: normal and unchanged  Patient tolerated the procedure well with no immediate complications.

## 2019-06-25 NOTE — Anesthesia Procedure Notes (Signed)
Procedure Name: Intubation Date/Time: 06/25/2019 7:56 AM Performed by: Renato Shin, CRNA Pre-anesthesia Checklist: Patient identified, Emergency Drugs available, Suction available and Patient being monitored Patient Re-evaluated:Patient Re-evaluated prior to induction Oxygen Delivery Method: Circle system utilized Preoxygenation: Pre-oxygenation with 100% oxygen Induction Type: IV induction Ventilation: Mask ventilation without difficulty Laryngoscope Size: Glidescope and 4 Grade View: Grade I Tube type: Oral Tube size: 8.0 mm Number of attempts: 1 Airway Equipment and Method: Oral airway,  Video-laryngoscopy and Rigid stylet Placement Confirmation: ETT inserted through vocal cords under direct vision,  positive ETCO2 and breath sounds checked- equal and bilateral Secured at: 23 cm Tube secured with: Tape Dental Injury: Teeth and Oropharynx as per pre-operative assessment  Difficulty Due To: Difficult Airway- due to limited oral opening Comments: SRNA Doretha Sou intubated

## 2019-06-25 NOTE — Progress Notes (Signed)
RT called for Nitric setup for 0815 in OR room 14.  Pt placed on Nitric 20ppm per MD.  Pt is on OR's vent.  RT will be called when pt is ready for tranport to ICU. Rt will continue to monitor.

## 2019-06-25 NOTE — Progress Notes (Signed)
TCTS Evening Rounds  DOS from LVAD Hemodyn stable Pump flows 4.2; PI 2.9 MAP 82 CI 2.15  Following commands Not bleeding  A/P: begin iNO wean soon Anticipate extubation in the morning Leave other drips at current rates Aaron Code Z. Orvan Seen, [place]

## 2019-06-25 NOTE — Progress Notes (Signed)
ANTICOAGULATION CONSULT NOTE - Follow Up Consult  Pharmacy Consult for heparin Indication: IABP  Labs: Recent Labs    06/22/19 1159 06/22/19 1227 06/23/19 0457 06/23/19 1357 06/24/19 0448 06/24/19 0448 06/24/19 1350 06/24/19 1350 06/24/19 1357 06/24/19 2143 06/25/19 0345  HGB  --    < > 14.0   < > 15.4   < > 15.3  15.6   < > 12.6*  --  15.7  HCT  --    < > 41.4   < > 45.0   < > 45.0  46.0  --  37.0*  --  46.2  PLT  --   --  136*  --  163  --   --   --   --   --  190  APTT 31  --   --   --   --   --   --   --   --   --  63*  LABPROT 14.2  --   --   --   --   --   --   --   --   --   --   INR 1.1  --   --   --   --   --   --   --   --   --   --   HEPARINUNFRC  --   --   --   --   --   --   --   --   --  0.16* 0.32  CREATININE  --   --  1.13  --  1.23  --   --   --   --   --  1.21   < > = values in this interval not displayed.    Assessment/Plan:  54yo male therapeutic on heparin after rate change. Will continue gtt at current rate and confirm stable with f/u after OR.   Wynona Neat, PharmD, BCPS  06/25/2019,4:45 AM

## 2019-06-25 NOTE — Progress Notes (Signed)
Rt called to tranport pt from OR to ICU.  Pt remained on Nitric.  No issues during transport.  RT will continue to monitor.

## 2019-06-26 ENCOUNTER — Encounter (HOSPITAL_COMMUNITY): Payer: Self-pay | Admitting: Cardiothoracic Surgery

## 2019-06-26 ENCOUNTER — Inpatient Hospital Stay (HOSPITAL_COMMUNITY): Payer: BC Managed Care – PPO

## 2019-06-26 LAB — BPAM FFP
Blood Product Expiration Date: 202106282359
Blood Product Expiration Date: 202106282359
Blood Product Expiration Date: 202106282359
Blood Product Expiration Date: 202106282359
Blood Product Expiration Date: 202106292359
Blood Product Expiration Date: 202106292359
ISSUE DATE / TIME: 202106240740
ISSUE DATE / TIME: 202106240740
ISSUE DATE / TIME: 202106240740
ISSUE DATE / TIME: 202106240740
ISSUE DATE / TIME: 202106241117
ISSUE DATE / TIME: 202106241117
Unit Type and Rh: 600
Unit Type and Rh: 6200
Unit Type and Rh: 6200
Unit Type and Rh: 6200
Unit Type and Rh: 6200
Unit Type and Rh: 6200

## 2019-06-26 LAB — POCT I-STAT 7, (LYTES, BLD GAS, ICA,H+H)
Acid-Base Excess: 0 mmol/L (ref 0.0–2.0)
Acid-Base Excess: 0 mmol/L (ref 0.0–2.0)
Acid-Base Excess: 1 mmol/L (ref 0.0–2.0)
Acid-Base Excess: 1 mmol/L (ref 0.0–2.0)
Acid-Base Excess: 2 mmol/L (ref 0.0–2.0)
Acid-Base Excess: 2 mmol/L (ref 0.0–2.0)
Acid-base deficit: 1 mmol/L (ref 0.0–2.0)
Bicarbonate: 22.7 mmol/L (ref 20.0–28.0)
Bicarbonate: 23.9 mmol/L (ref 20.0–28.0)
Bicarbonate: 24 mmol/L (ref 20.0–28.0)
Bicarbonate: 24.1 mmol/L (ref 20.0–28.0)
Bicarbonate: 25.6 mmol/L (ref 20.0–28.0)
Bicarbonate: 25.9 mmol/L (ref 20.0–28.0)
Bicarbonate: 26.5 mmol/L (ref 20.0–28.0)
Calcium, Ion: 0.98 mmol/L — ABNORMAL LOW (ref 1.15–1.40)
Calcium, Ion: 1.08 mmol/L — ABNORMAL LOW (ref 1.15–1.40)
Calcium, Ion: 1.1 mmol/L — ABNORMAL LOW (ref 1.15–1.40)
Calcium, Ion: 1.14 mmol/L — ABNORMAL LOW (ref 1.15–1.40)
Calcium, Ion: 1.17 mmol/L (ref 1.15–1.40)
Calcium, Ion: 1.19 mmol/L (ref 1.15–1.40)
Calcium, Ion: 1.22 mmol/L (ref 1.15–1.40)
HCT: 29 % — ABNORMAL LOW (ref 39.0–52.0)
HCT: 30 % — ABNORMAL LOW (ref 39.0–52.0)
HCT: 31 % — ABNORMAL LOW (ref 39.0–52.0)
HCT: 31 % — ABNORMAL LOW (ref 39.0–52.0)
HCT: 36 % — ABNORMAL LOW (ref 39.0–52.0)
HCT: 36 % — ABNORMAL LOW (ref 39.0–52.0)
HCT: 48 % (ref 39.0–52.0)
Hemoglobin: 10.2 g/dL — ABNORMAL LOW (ref 13.0–17.0)
Hemoglobin: 10.5 g/dL — ABNORMAL LOW (ref 13.0–17.0)
Hemoglobin: 10.5 g/dL — ABNORMAL LOW (ref 13.0–17.0)
Hemoglobin: 12.2 g/dL — ABNORMAL LOW (ref 13.0–17.0)
Hemoglobin: 12.2 g/dL — ABNORMAL LOW (ref 13.0–17.0)
Hemoglobin: 16.3 g/dL (ref 13.0–17.0)
Hemoglobin: 9.9 g/dL — ABNORMAL LOW (ref 13.0–17.0)
O2 Saturation: 100 %
O2 Saturation: 100 %
O2 Saturation: 94 %
O2 Saturation: 96 %
O2 Saturation: 97 %
O2 Saturation: 98 %
O2 Saturation: 99 %
Patient temperature: 37.7
Patient temperature: 37.7
Patient temperature: 38.1
Patient temperature: 38.2
Potassium: 3.8 mmol/L (ref 3.5–5.1)
Potassium: 3.9 mmol/L (ref 3.5–5.1)
Potassium: 4.1 mmol/L (ref 3.5–5.1)
Potassium: 4.5 mmol/L (ref 3.5–5.1)
Potassium: 4.6 mmol/L (ref 3.5–5.1)
Potassium: 4.6 mmol/L (ref 3.5–5.1)
Potassium: 4.9 mmol/L (ref 3.5–5.1)
Sodium: 133 mmol/L — ABNORMAL LOW (ref 135–145)
Sodium: 134 mmol/L — ABNORMAL LOW (ref 135–145)
Sodium: 135 mmol/L (ref 135–145)
Sodium: 135 mmol/L (ref 135–145)
Sodium: 135 mmol/L (ref 135–145)
Sodium: 135 mmol/L (ref 135–145)
Sodium: 135 mmol/L (ref 135–145)
TCO2: 24 mmol/L (ref 22–32)
TCO2: 25 mmol/L (ref 22–32)
TCO2: 25 mmol/L (ref 22–32)
TCO2: 25 mmol/L (ref 22–32)
TCO2: 27 mmol/L (ref 22–32)
TCO2: 27 mmol/L (ref 22–32)
TCO2: 28 mmol/L (ref 22–32)
pCO2 arterial: 33 mmHg (ref 32.0–48.0)
pCO2 arterial: 36 mmHg (ref 32.0–48.0)
pCO2 arterial: 36.1 mmHg (ref 32.0–48.0)
pCO2 arterial: 37 mmHg (ref 32.0–48.0)
pCO2 arterial: 38.5 mmHg (ref 32.0–48.0)
pCO2 arterial: 38.9 mmHg (ref 32.0–48.0)
pCO2 arterial: 43.7 mmHg (ref 32.0–48.0)
pH, Arterial: 7.39 (ref 7.350–7.450)
pH, Arterial: 7.406 (ref 7.350–7.450)
pH, Arterial: 7.412 (ref 7.350–7.450)
pH, Arterial: 7.424 (ref 7.350–7.450)
pH, Arterial: 7.431 (ref 7.350–7.450)
pH, Arterial: 7.459 — ABNORMAL HIGH (ref 7.350–7.450)
pH, Arterial: 7.473 — ABNORMAL HIGH (ref 7.350–7.450)
pO2, Arterial: 148 mmHg — ABNORMAL HIGH (ref 83.0–108.0)
pO2, Arterial: 186 mmHg — ABNORMAL HIGH (ref 83.0–108.0)
pO2, Arterial: 223 mmHg — ABNORMAL HIGH (ref 83.0–108.0)
pO2, Arterial: 75 mmHg — ABNORMAL LOW (ref 83.0–108.0)
pO2, Arterial: 80 mmHg — ABNORMAL LOW (ref 83.0–108.0)
pO2, Arterial: 83 mmHg (ref 83.0–108.0)
pO2, Arterial: 99 mmHg (ref 83.0–108.0)

## 2019-06-26 LAB — BASIC METABOLIC PANEL
Anion gap: 8 (ref 5–15)
Anion gap: 11 (ref 5–15)
BUN: 12 mg/dL (ref 6–20)
BUN: 15 mg/dL (ref 6–20)
CO2: 22 mmol/L (ref 22–32)
CO2: 22 mmol/L (ref 22–32)
Calcium: 8 mg/dL — ABNORMAL LOW (ref 8.9–10.3)
Calcium: 8.1 mg/dL — ABNORMAL LOW (ref 8.9–10.3)
Chloride: 103 mmol/L (ref 98–111)
Chloride: 98 mmol/L (ref 98–111)
Creatinine, Ser: 1 mg/dL (ref 0.61–1.24)
Creatinine, Ser: 1.23 mg/dL (ref 0.61–1.24)
GFR calc Af Amer: >60 mL/min (ref >60)
GFR calc Af Amer: >60 mL/min (ref >60)
GFR calc non Af Amer: >60 mL/min (ref >60)
GFR calc non Af Amer: >60 mL/min (ref >60)
Glucose, Bld: 144 mg/dL — ABNORMAL HIGH (ref 70–99)
Glucose, Bld: 161 mg/dL — ABNORMAL HIGH (ref 70–99)
Potassium: 4.7 mmol/L (ref 3.5–5.1)
Potassium: 4 mmol/L (ref 3.5–5.1)
Sodium: 133 mmol/L — ABNORMAL LOW (ref 135–145)
Sodium: 131 mmol/L — ABNORMAL LOW (ref 135–145)

## 2019-06-26 LAB — PREPARE FRESH FROZEN PLASMA
Unit division: 0
Unit division: 0
Unit division: 0

## 2019-06-26 LAB — CBC WITH DIFFERENTIAL/PLATELET
Abs Immature Granulocytes: 0.03 10*3/uL (ref 0.00–0.07)
Basophils Absolute: 0 10*3/uL (ref 0.0–0.1)
Basophils Relative: 1 %
Eosinophils Absolute: 0 10*3/uL (ref 0.0–0.5)
Eosinophils Relative: 0 %
HCT: 30.6 % — ABNORMAL LOW (ref 39.0–52.0)
Hemoglobin: 10.2 g/dL — ABNORMAL LOW (ref 13.0–17.0)
Immature Granulocytes: 0 %
Lymphocytes Relative: 11 %
Lymphs Abs: 0.8 10*3/uL (ref 0.7–4.0)
MCH: 33.2 pg (ref 26.0–34.0)
MCHC: 33.3 g/dL (ref 30.0–36.0)
MCV: 99.7 fL (ref 80.0–100.0)
Monocytes Absolute: 0.5 10*3/uL (ref 0.1–1.0)
Monocytes Relative: 8 %
Neutro Abs: 5.7 10*3/uL (ref 1.7–7.7)
Neutrophils Relative %: 80 %
Platelets: 137 10*3/uL — ABNORMAL LOW (ref 150–400)
RBC: 3.07 MIL/uL — ABNORMAL LOW (ref 4.22–5.81)
RDW: 12.9 % (ref 11.5–15.5)
WBC: 7.1 10*3/uL (ref 4.0–10.5)
nRBC: 0 % (ref 0.0–0.2)

## 2019-06-26 LAB — CBC
HCT: 31.9 % — ABNORMAL LOW (ref 39.0–52.0)
HCT: 32 % — ABNORMAL LOW (ref 39.0–52.0)
Hemoglobin: 10.6 g/dL — ABNORMAL LOW (ref 13.0–17.0)
Hemoglobin: 10.7 g/dL — ABNORMAL LOW (ref 13.0–17.0)
MCH: 33 pg (ref 26.0–34.0)
MCH: 33.1 pg (ref 26.0–34.0)
MCHC: 33.1 g/dL (ref 30.0–36.0)
MCHC: 33.5 g/dL (ref 30.0–36.0)
MCV: 100 fL (ref 80.0–100.0)
MCV: 98.5 fL (ref 80.0–100.0)
Platelets: 151 10*3/uL (ref 150–400)
Platelets: 186 10*3/uL (ref 150–400)
RBC: 3.2 MIL/uL — ABNORMAL LOW (ref 4.22–5.81)
RBC: 3.24 MIL/uL — ABNORMAL LOW (ref 4.22–5.81)
RDW: 12.7 % (ref 11.5–15.5)
RDW: 13.1 % (ref 11.5–15.5)
WBC: 12.8 10*3/uL — ABNORMAL HIGH (ref 4.0–10.5)
WBC: 8.5 10*3/uL (ref 4.0–10.5)
nRBC: 0 % (ref 0.0–0.2)
nRBC: 0 % (ref 0.0–0.2)

## 2019-06-26 LAB — POCT I-STAT, CHEM 8
BUN: 19 mg/dL (ref 6–20)
BUN: 19 mg/dL (ref 6–20)
BUN: 17 mg/dL (ref 6–20)
BUN: 18 mg/dL (ref 6–20)
BUN: 18 mg/dL (ref 6–20)
Calcium, Ion: 1.02 mmol/L — ABNORMAL LOW (ref 1.15–1.40)
Calcium, Ion: 1.08 mmol/L — ABNORMAL LOW (ref 1.15–1.40)
Calcium, Ion: 0.93 mmol/L — ABNORMAL LOW (ref 1.15–1.40)
Calcium, Ion: 0.97 mmol/L — ABNORMAL LOW (ref 1.15–1.40)
Calcium, Ion: 1.3 mmol/L (ref 1.15–1.40)
Chloride: 96 mmol/L — ABNORMAL LOW (ref 98–111)
Chloride: 96 mmol/L — ABNORMAL LOW (ref 98–111)
Chloride: 93 mmol/L — ABNORMAL LOW (ref 98–111)
Chloride: 97 mmol/L — ABNORMAL LOW (ref 98–111)
Chloride: 95 mmol/L — ABNORMAL LOW (ref 98–111)
Creatinine, Ser: 1.2 mg/dL (ref 0.61–1.24)
Creatinine, Ser: 1 mg/dL (ref 0.61–1.24)
Creatinine, Ser: 0.9 mg/dL (ref 0.61–1.24)
Creatinine, Ser: 0.9 mg/dL (ref 0.61–1.24)
Creatinine, Ser: 0.8 mg/dL (ref 0.61–1.24)
Glucose, Bld: 134 mg/dL — ABNORMAL HIGH (ref 70–99)
Glucose, Bld: 162 mg/dL — ABNORMAL HIGH (ref 70–99)
Glucose, Bld: 154 mg/dL — ABNORMAL HIGH (ref 70–99)
Glucose, Bld: 179 mg/dL — ABNORMAL HIGH (ref 70–99)
Glucose, Bld: 179 mg/dL — ABNORMAL HIGH (ref 70–99)
HCT: 45 % (ref 39.0–52.0)
HCT: 42 % (ref 39.0–52.0)
HCT: 34 % — ABNORMAL LOW (ref 39.0–52.0)
HCT: 34 % — ABNORMAL LOW (ref 39.0–52.0)
HCT: 39 % (ref 39.0–52.0)
Hemoglobin: 15.3 g/dL (ref 13.0–17.0)
Hemoglobin: 14.3 g/dL (ref 13.0–17.0)
Hemoglobin: 11.6 g/dL — ABNORMAL LOW (ref 13.0–17.0)
Hemoglobin: 11.6 g/dL — ABNORMAL LOW (ref 13.0–17.0)
Hemoglobin: 13.3 g/dL (ref 13.0–17.0)
Potassium: 4.2 mmol/L (ref 3.5–5.1)
Potassium: 3.8 mmol/L (ref 3.5–5.1)
Potassium: 3.9 mmol/L (ref 3.5–5.1)
Potassium: 4 mmol/L (ref 3.5–5.1)
Potassium: 3.7 mmol/L (ref 3.5–5.1)
Sodium: 134 mmol/L — ABNORMAL LOW (ref 135–145)
Sodium: 133 mmol/L — ABNORMAL LOW (ref 135–145)
Sodium: 133 mmol/L — ABNORMAL LOW (ref 135–145)
Sodium: 135 mmol/L (ref 135–145)
Sodium: 137 mmol/L (ref 135–145)
TCO2: 26 mmol/L (ref 22–32)
TCO2: 23 mmol/L (ref 22–32)
TCO2: 26 mmol/L (ref 22–32)
TCO2: 29 mmol/L (ref 22–32)
TCO2: 28 mmol/L (ref 22–32)

## 2019-06-26 LAB — GLUCOSE, CAPILLARY
Glucose-Capillary: 115 mg/dL — ABNORMAL HIGH (ref 70–99)
Glucose-Capillary: 122 mg/dL — ABNORMAL HIGH (ref 70–99)
Glucose-Capillary: 127 mg/dL — ABNORMAL HIGH (ref 70–99)
Glucose-Capillary: 131 mg/dL — ABNORMAL HIGH (ref 70–99)
Glucose-Capillary: 132 mg/dL — ABNORMAL HIGH (ref 70–99)
Glucose-Capillary: 137 mg/dL — ABNORMAL HIGH (ref 70–99)
Glucose-Capillary: 137 mg/dL — ABNORMAL HIGH (ref 70–99)
Glucose-Capillary: 139 mg/dL — ABNORMAL HIGH (ref 70–99)
Glucose-Capillary: 139 mg/dL — ABNORMAL HIGH (ref 70–99)
Glucose-Capillary: 143 mg/dL — ABNORMAL HIGH (ref 70–99)
Glucose-Capillary: 144 mg/dL — ABNORMAL HIGH (ref 70–99)
Glucose-Capillary: 146 mg/dL — ABNORMAL HIGH (ref 70–99)
Glucose-Capillary: 148 mg/dL — ABNORMAL HIGH (ref 70–99)
Glucose-Capillary: 150 mg/dL — ABNORMAL HIGH (ref 70–99)
Glucose-Capillary: 152 mg/dL — ABNORMAL HIGH (ref 70–99)
Glucose-Capillary: 157 mg/dL — ABNORMAL HIGH (ref 70–99)
Glucose-Capillary: 158 mg/dL — ABNORMAL HIGH (ref 70–99)
Glucose-Capillary: 197 mg/dL — ABNORMAL HIGH (ref 70–99)
Glucose-Capillary: 238 mg/dL — ABNORMAL HIGH (ref 70–99)
Glucose-Capillary: 80 mg/dL (ref 70–99)

## 2019-06-26 LAB — COMPREHENSIVE METABOLIC PANEL
ALT: 86 U/L — ABNORMAL HIGH (ref 0–44)
AST: 88 U/L — ABNORMAL HIGH (ref 15–41)
Albumin: 3 g/dL — ABNORMAL LOW (ref 3.5–5.0)
Alkaline Phosphatase: 47 U/L (ref 38–126)
Anion gap: 8 (ref 5–15)
BUN: 12 mg/dL (ref 6–20)
CO2: 21 mmol/L — ABNORMAL LOW (ref 22–32)
Calcium: 7.9 mg/dL — ABNORMAL LOW (ref 8.9–10.3)
Chloride: 102 mmol/L (ref 98–111)
Creatinine, Ser: 0.95 mg/dL (ref 0.61–1.24)
GFR calc Af Amer: >60 mL/min (ref >60)
GFR calc non Af Amer: >60 mL/min (ref >60)
Glucose, Bld: 139 mg/dL — ABNORMAL HIGH (ref 70–99)
Potassium: 4.6 mmol/L (ref 3.5–5.1)
Sodium: 131 mmol/L — ABNORMAL LOW (ref 135–145)
Total Bilirubin: 1.1 mg/dL (ref 0.3–1.2)
Total Protein: 5.4 g/dL — ABNORMAL LOW (ref 6.5–8.1)

## 2019-06-26 LAB — FACTOR 5 LEIDEN

## 2019-06-26 LAB — PROTIME-INR
INR: 1.3 — ABNORMAL HIGH (ref 0.8–1.2)
Prothrombin Time: 15.4 seconds — ABNORMAL HIGH (ref 11.4–15.2)

## 2019-06-26 LAB — MAGNESIUM
Magnesium: 2.4 mg/dL (ref 1.7–2.4)
Magnesium: 2.4 mg/dL (ref 1.7–2.4)
Magnesium: 2.4 mg/dL (ref 1.7–2.4)

## 2019-06-26 LAB — COOXEMETRY PANEL
Carboxyhemoglobin: 0.8 % (ref 0.5–1.5)
Methemoglobin: 0.8 % (ref 0.0–1.5)
O2 Saturation: 54.6 %
Total hemoglobin: 10.5 g/dL — ABNORMAL LOW (ref 12.0–16.0)

## 2019-06-26 LAB — PHOSPHORUS: Phosphorus: 4.1 mg/dL (ref 2.5–4.6)

## 2019-06-26 LAB — BRAIN NATRIURETIC PEPTIDE: B Natriuretic Peptide: 1301.1 pg/mL — ABNORMAL HIGH (ref 0.0–100.0)

## 2019-06-26 LAB — LACTATE DEHYDROGENASE: LDH: 398 U/L — ABNORMAL HIGH (ref 98–192)

## 2019-06-26 LAB — CALCIUM, IONIZED: Calcium, Ionized, Serum: 4.8 mg/dL (ref 4.5–5.6)

## 2019-06-26 LAB — SURGICAL PATHOLOGY

## 2019-06-26 MED ORDER — FUROSEMIDE 10 MG/ML IJ SOLN
8.0000 mg/h | INTRAVENOUS | Status: DC
  Administered 2019-06-26: 8 mg/h via INTRAVENOUS
  Administered 2019-06-27: 4 mg/h via INTRAVENOUS
  Administered 2019-06-28 – 2019-07-01 (×3): 8 mg/h via INTRAVENOUS
  Filled 2019-06-26 (×5): qty 25

## 2019-06-26 MED ORDER — ALBUMIN HUMAN 25 % IV SOLN
12.5000 g | Freq: Two times a day (BID) | INTRAVENOUS | Status: AC
  Administered 2019-06-26 (×2): 12.5 g via INTRAVENOUS
  Filled 2019-06-26: qty 50

## 2019-06-26 MED ORDER — ORAL CARE MOUTH RINSE
15.0000 mL | Freq: Two times a day (BID) | OROMUCOSAL | Status: DC
  Administered 2019-06-26 – 2019-06-30 (×7): 15 mL via OROMUCOSAL

## 2019-06-26 MED ORDER — FUROSEMIDE 10 MG/ML IJ SOLN
40.0000 mg | Freq: Once | INTRAMUSCULAR | Status: AC
  Administered 2019-06-26: 40 mg via INTRAVENOUS
  Filled 2019-06-26: qty 4

## 2019-06-26 MED ORDER — KETOROLAC TROMETHAMINE 15 MG/ML IJ SOLN
7.5000 mg | Freq: Four times a day (QID) | INTRAMUSCULAR | Status: AC
  Administered 2019-06-26 – 2019-06-27 (×5): 7.5 mg via INTRAVENOUS
  Filled 2019-06-26 (×5): qty 1

## 2019-06-26 NOTE — Progress Notes (Signed)
Existing VAD dressing removed and site care performed using sterile technique.  Drive line site cleaned with Chlora prep applicator x2, allowed to dry, and gauze dressing with silver strip re-applied.  Exit site with suture remaining, the velour is fully implanted at the exit site.  No drainage noted on dressing, no redness, tenderness, foul odor or rash noted.  Drive line anchor intact.  Patient sister at bedside for dressing change.  All questions answered at this time.

## 2019-06-26 NOTE — Progress Notes (Addendum)
Advanced Heart Failure Rounding Note  PCP-Cardiologist: No primary care provider on file.   Subjective:   S/P HMIII today   On multiple drips milrinone + vasopressin + norei + epi   Planning extubation this morning.   Swan #s 26/11 CO 4.3 CI 2.1    LVAD Interrogation HM III: Speed: 5150  Flow: 4.1 PI:3.2 Power: 3.5  Objective:   Weight Range: 102.4 kg Body mass index is 33.34 kg/m.   Vital Signs:   Temp:  [98.4 F (36.9 C)-102.2 F (39 C)] 100 F (37.8 C) (06/25 0700) Pulse Rate:  [26-124] 124 (06/25 0700) Resp:  [0-28] 24 (06/25 0700) BP: (72-130)/(42-103) 130/95 (06/25 0700) SpO2:  [76 %-100 %] 95 % (06/25 0700) Arterial Line BP: (75-107)/(60-91) 75/62 (06/25 0700) FiO2 (%):  [40 %-50 %] 40 % (06/25 0542) Weight:  [102.4 kg] 102.4 kg (06/25 0500) Last BM Date: 06/24/19  Weight change: Filed Weights   06/23/19 0500 06/25/19 0500 06/26/19 0500  Weight: 92.4 kg 93.9 kg 102.4 kg    Intake/Output:   Intake/Output Summary (Last 24 hours) at 06/26/2019 0734 Last data filed at 06/26/2019 0700 Gross per 24 hour  Intake 7184.34 ml  Output 3330 ml  Net 3854.34 ml      Physical Exam   Physical Exam: GENERAL: Intubated  HEENT: ETT  NECK: Supple, JVP difficult to assess with lines  .  2+ bilaterally, no bruits.  No lymphadenopathy or thyromegaly appreciated.  RIJ  CARDIAC:  Mechanical heart sounds with LVAD hum present.  LUNGS:  Crackles in the bases.   ABDOMEN:  Soft, round, nontender, positive bowel sounds x4.     LVAD exit site:  Dressing dry and intact.    Stabilization device present and accurately applied.  Driveline dressing is being changed daily per sterile technique. EXTREMITIES:  Warm and dry, no cyanosis, clubbing, rash . R and LLE 1+ edema  NEUROLOGIC: Intubated awake and following commands.   Telemetry   NSr with occasional PVCs.    Labs    CBC Recent Labs    06/26/19 0020 06/26/19 0020 06/26/19 0445 06/26/19 0445 06/26/19 0501  06/26/19 0607  WBC 8.5  --  7.1  --   --   --   NEUTROABS  --   --  5.7  --   --   --   HGB 10.7*   < > 10.2*   < > 9.9* 10.2*  HCT 31.9*   < > 30.6*   < > 29.0* 30.0*  MCV 98.5  --  99.7  --   --   --   PLT 151  --  137*  --   --   --    < > = values in this interval not displayed.   Basic Metabolic Panel Recent Labs    06/25/19 1233 06/26/19 0020 06/26/19 0027 06/26/19 0027 06/26/19 0445 06/26/19 0445 06/26/19 0501 06/26/19 0607  NA   < >  --  133*   < > 131*   < > 135 135  K   < >  --  4.7   < > 4.6   < > 4.6 4.9  CL   < >  --  103  --  102  --   --   --   CO2   < >  --  22  --  21*  --   --   --   GLUCOSE   < >  --  144*  --  139*  --   --   --   BUN   < >  --  12  --  12  --   --   --   CREATININE   < >  --  1.00  --  0.95  --   --   --   CALCIUM   < >  --  8.0*  --  7.9*  --   --   --   MG  --  2.4  --   --  2.4  --   --   --   PHOS  --   --   --   --  4.1  --   --   --    < > = values in this interval not displayed.   Liver Function Tests Recent Labs    06/23/19 1430 06/26/19 0445  AST 123* 88*  ALT 125* 86*  ALKPHOS 57 47  BILITOT 0.9 1.1  PROT 6.1* 5.4*  ALBUMIN 2.5* 3.0*   No results for input(s): LIPASE, AMYLASE in the last 72 hours. Cardiac Enzymes No results for input(s): CKTOTAL, CKMB, CKMBINDEX, TROPONINI in the last 72 hours.  BNP: BNP (last 3 results) Recent Labs    06/17/19 2023 06/18/19 0119  BNP 751.1* 1,068.5*    ProBNP (last 3 results) No results for input(s): PROBNP in the last 8760 hours.   D-Dimer Recent Labs    06/25/19 1233  DDIMER 3.48*   Hemoglobin A1C No results for input(s): HGBA1C in the last 72 hours. Fasting Lipid Panel No results for input(s): CHOL, HDL, LDLCALC, TRIG, CHOLHDL, LDLDIRECT in the last 72 hours. Thyroid Function Tests No results for input(s): TSH, T4TOTAL, T3FREE, THYROIDAB in the last 72 hours.  Invalid input(s): FREET3  Other results:   Imaging    DG Chest Port 1 View  Result Date:  06/26/2019 CLINICAL DATA:  ETT, LVAD EXAM: PORTABLE CHEST 1 VIEW COMPARISON:  Radiograph 06/25/2019 FINDINGS: Endotracheal tube terminates in the mid trachea, approximately 5 cm from the level of the carina. Transesophageal tube tip terminates below the level of imaging, beyond the GE junction. Right IJ catheter sheath with a Swan-Ganz catheter positioned likely in a distal branch of the right pulmonary artery. Mediastinal drains in place. Postsurgical changes from sternotomy. LVAD projects over the cardiac apex with drive lead intact and coursing towards the midline abdomen. Additional support devices overlie the chest. There is some persistent heterogeneous opacity throughout the right lung likely with some increasing volume loss in the right hemithorax as well. Could reflect some edema or airspace disease likely with superimposed atelectatic changes. Mild retrocardiac opacity could reflect further atelectatic changes in the left lung, left lung being otherwise clear. No pneumothorax or effusion. No other acute osseous or soft tissue abnormality. Degenerative changes are present in the imaged spine and shoulders. IMPRESSION: 1. Persistent heterogeneous opacity throughout the right lung likely with some increasing volume loss in the right hemithorax as well. Could reflect some edema or airspace disease with superimposed atelectatic changes. 2. Mild retrocardiac opacity could reflect further atelectatic changes in the left lung. 3. Lines and tubes as above with LVAD at the cardiac apex. Electronically Signed   By: Lovena Le M.D.   On: 06/26/2019 07:02   DG Chest Port 1 View  Result Date: 06/25/2019 CLINICAL DATA:  Status post LVAD insertion. Chronic systolic congestive heart failure. EXAM: PORTABLE CHEST 1 VIEW 12:34 p.m. COMPARISON:  Chest x-ray dated 06/25/2019 at 2:32 a.m. FINDINGS: Endotracheal  tube tip in good position 4.7 cm above the carina. NG tube tip in the fundus of the stomach. Swan-Ganz catheter  is in an artery to the right lower lobe. Two chest tubes in place. Unchanged cardiomegaly.  No pneumothorax. New slight haziness in the right upper lobe which may represent mild edema. The lungs are otherwise clear. IMPRESSION: 1. New slight haziness in the right upper lobe which may represent mild edema. 2. No pneumothorax. 3. Endotracheal tube in good position. Swan-Ganz catheter is in an artery to the right lower lobe. Electronically Signed   By: Lorriane Shire M.D.   On: 06/25/2019 13:12     Medications:     Scheduled Medications: . acetaminophen  1,000 mg Oral Q6H   Or  . acetaminophen (TYLENOL) oral liquid 160 mg/5 mL  1,000 mg Per Tube Q6H  . aspirin EC  325 mg Oral Daily   Or  . aspirin  324 mg Per Tube Daily   Or  . aspirin  300 mg Rectal Daily  . atorvastatin  80 mg Oral Daily  . bisacodyl  10 mg Oral Daily   Or  . bisacodyl  10 mg Rectal Daily  . chlorhexidine gluconate (MEDLINE KIT)  15 mL Mouth Rinse BID  . Chlorhexidine Gluconate Cloth  6 each Topical Daily  . clonazePAM  0.5 mg Oral BID  . docusate sodium  200 mg Oral Daily  . folic acid  1 mg Oral Daily  . furosemide  40 mg Intravenous Once  . mouth rinse  15 mL Mouth Rinse 10 times per day  . multivitamin with minerals  1 tablet Oral Daily  . [START ON 06/27/2019] pantoprazole  40 mg Oral Daily  . sodium chloride flush  10-40 mL Intracatheter Q12H  . sodium chloride flush  3 mL Intravenous Q12H  . sodium chloride flush  3 mL Intravenous Q12H  . sodium chloride flush  3 mL Intravenous Q12H  . thiamine  100 mg Oral Daily    Infusions: . sodium chloride Stopped (06/26/19 0508)  . sodium chloride    . sodium chloride Stopped (06/19/19 1443)  . sodium chloride Stopped (06/18/19 0953)  . sodium chloride Stopped (06/19/19 1446)  . sodium chloride    . sodium chloride    . sodium chloride    . sodium chloride    . sodium chloride    . sodium chloride 10 mL/hr at 06/25/19 1220  . amiodarone 30 mg/hr (06/26/19  0705)  . cefUROXime (ZINACEF)  IV Stopped (06/25/19 2042)  . dexmedetomidine (PRECEDEX) IV infusion Stopped (06/26/19 0442)  . epinephrine 3 mcg/min (06/26/19 0700)  . fentaNYL infusion INTRAVENOUS Stopped (06/26/19 0502)  . furosemide (LASIX) infusion    . insulin 2.2 mL/hr at 06/26/19 0700  . lactated ringers    . lactated ringers    . lactated ringers 20 mL/hr at 06/26/19 0700  . milrinone 0.125 mcg/kg/min (06/26/19 0700)  . norepinephrine (LEVOPHED) Adult infusion 3 mcg/min (06/26/19 0718)  . rifampin (RIFADIN) IVPB    . vancomycin Stopped (06/25/19 2159)  . vasopressin (PITRESSIN) infusion - *FOR SHOCK* 0.02 Units/min (06/26/19 0721)    PRN Medications: sodium chloride, sodium chloride, sodium chloride, sodium chloride, sodium chloride, sodium chloride, acetaminophen, ALPRAZolam, dextrose, fentaNYL (SUBLIMAZE) injection, lactated ringers, midazolam, morphine injection, nitroGLYCERIN, ondansetron (ZOFRAN) IV, oxyCODONE, [START ON 07/02/2019] pneumococcal 23 valent vaccine, sodium chloride flush, sodium chloride flush, sodium chloride flush, sodium chloride flush, traMADol, traZODone    Patient Profile   Aaron [name] --  post MI cardiogenic shock/ optimization prior to anticipated CABG, at the request of Dr. Marlou Porch, Cardiology.    Assessment/Plan   1. 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 Ok to start diuresing. CXR wet. Give 40 mg IV lasix now and start lasix drip at 8 mg per hour.  - Slow pressor wean. CO-OX 55%. - Starting asa today.  - Hgb stable.  - Renal function stable.    2. Acute Anterolateral STEMI/ Multivessel CAD - Emergent cath showed severe multivessel disease w/ 75% mid- distal LM, 100% prox LAD, not amendable to PCI (unable to cross w/ guide wire, probable CTO), 100% prox LCx (probable CTO) and 100% ostial RCA (probable CTO). There were mild to moderate collaterals right to left to the LAD, minimal  right to right collaterals an minimal left to left collaterals. LVEF 25% - Hs trop peaked to 17,602>27,000. - No targets for CABG / PCI.   -S/P HMIII VAD - Plan for ramp ECHO next week.    3.  Acute Systolic Heart Failure --->Cardiogenic Shock - 2/2 acute MI. LVEF by LVG 25% - IABP out 6/18 but replaced 6/23. Remove in OR on 6/24  - HMIII LVAD  Placed 6/24 - Extubated 6/25    4. NSVT - improved - keep K> 4.0 Mg >2.0  - continue amio - K/Mag stable.  - low threshold to put IABP back  5  Hypertension  - reported h/o untreated HTN prior to admit - Controlled on current regimen   6. Tobacco Abuse - 30 pack years  - smoking cessation advised  - Will need to quit if to be considered for heart transplant in the future   7. Elevated LFTs due to shock liver - improved  8 ETOH  - Drinks 2 mixed drinks a night + more on the weekend.    9. Renal mass - seen on CT -  Concern for RCC.  - MRI abdomen- 1.8 cm right renal lesion, most consistent with renal cell carcinoma. No evidence of renal vein involvement or nodal metastasis. - Discussed w/ Urology and R RCC is small and will just require surveillance. - ok to proceed with VAD from their standpoint.   10  Thrombocytopenia - PLTs on admit 172   137 today.   11. Hyponatremia  - Sodium 131  11. Sleep Apnea - frequent apnea at night  - needs formal sleep study   - order CPAP while inpatient   Length of Stay: [place], NP  06/26/2019, 7:34 AM  Advanced Heart Failure Team Pager [phone] (M-F; 7a - 4p)  Please contact [facility] for night-coverage after hours (4p -7a ) and weekends on [email or web address]  Patient seen with NP, agree with the above note.   Pt just extubated this morning, awake/alert.    CVP about 15, CI 2.1, co-ox 55%.  He is on epinephrine 3, milrinone 0.125, NE 3, vasopressin 0.02. He is A-V sequentially paced on amiodarone.   General: Well appearing this am. NAD.  HEENT: Normal. Neck: Supple,  JVP 12 cm. Carotids OK.  Cardiac:  Mechanical heart sounds with LVAD hum present.  Lungs:  Decreased at bases.  Abdomen:  NT, ND, no HSM. No bruits or masses. +BS  LVAD exit site: Well-healed and incorporated. Dressing dry and intact. No erythema or drainage. Stabilization device present and accurately applied. Driveline dressing changed daily per sterile technique. Extremities:  Warm and dry. No cyanosis,  clubbing, rash. Trace edema.  Neuro:  Alert & oriented x 3. Cranial nerves grossly intact. Moves all 4 extremities w/o difficulty. Affect pleasant    Volume overloaded, will give Lasix 40 mg IV x 1 and start gtt at 8 mg/hr.  BMET in pm.  PI on the low side, will give albumen 12.5 mg bid x 2 doses today.   Slow wean of inotropes (RV support).    Begin warfarin when ok per surgery.   Tm 100.6, may be post-op, follow closely. WBCs not elevated.   CRITICAL CARE Performed by: Loralie Champagne  Total critical care time: 35 minutes  Critical care time was exclusive of separately billable procedures and treating other patients.  Critical care was necessary to treat or prevent imminent or life-threatening deterioration.  Critical care was time spent personally by me on the following activities: development of treatment plan with patient and/or surrogate as well as nursing, discussions with consultants, evaluation of patient's response to treatment, examination of patient, obtaining history from patient or surrogate, ordering and performing treatments and interventions, ordering and review of laboratory studies, ordering and review of radiographic studies, pulse oximetry and re-evaluation of patient's condition.  Loralie Champagne 06/26/2019 9:41 AM

## 2019-06-26 NOTE — Addendum Note (Signed)
Addendum  created 06/26/19 0935 by Josephine Igo, CRNA   Order list changed

## 2019-06-26 NOTE — Progress Notes (Signed)
Pts nitric weaned down and off at this time. RT will continue to monitor.

## 2019-06-26 NOTE — Procedures (Signed)
Extubation Procedure Note  Patient Details:   Name: Aaron [name] DOB: 1965/10/07 MRN: [id number]   Airway Documentation:    Vent end date: 06/26/19 Vent end time: 0727   Evaluation  O2 sats: stable throughout Complications: No apparent complications Patient did tolerate procedure well. Bilateral Breath Sounds: Clear, Diminished   Yes   Positive cuff leak noted.  No NIF or VC needed per Dr. Orvan Seen.  Patient placed on [place] 4 L with humidity, no stridor noted.  Patient able to reach 375 mL using the incentive spirometer.  Bayard Beaver 06/26/2019, 7:39 AM

## 2019-06-26 NOTE — Progress Notes (Signed)
RT assessed pt for readiness to wean to next phase of heart wean. Pt able to perform all task without difficulty. Pt placed on CPAP/PSV 10/5 40%. Pt respiratory status is stable at this time. RT will continue to monitor.

## 2019-06-26 NOTE — Progress Notes (Signed)
1 Day Post-Op Procedure(s) (LRB): INSERTION OF IMPLANTABLE LEFT VENTRICULAR ASSIST DEVICE USING THORATEC HEARTMATE 3 (N/A) TRANSESOPHAGEAL ECHOCARDIOGRAM (TEE) (N/A) Subjective: No complaints  Objective: Vital signs in last 24 hours: Temp:  [98.4 F (36.9 C)-102.2 F (39 C)] 100.2 F (37.9 C) (06/25 0745) Pulse Rate:  [26-124] 37 (06/25 0745) Cardiac Rhythm: A-V Sequential paced (06/25 0000) Resp:  [0-31] 31 (06/25 0745) BP: (72-130)/(15-103) 79/15 (06/25 0745) SpO2:  [76 %-100 %] 97 % (06/25 0745) Arterial Line BP: (75-107)/(60-91) 78/66 (06/25 0745) FiO2 (%):  [40 %-50 %] 40 % (06/25 0542) Weight:  [102.4 kg] 102.4 kg (06/25 0500)  Hemodynamic parameters for last 24 hours: PAP: (13-35)/(8-27) 26/10 CVP:  [9 mmHg-29 mmHg] 19 mmHg CO:  [3.7 L/min-5.3 L/min] 4.3 L/min CI:  [1.8 L/min/m2-2.5 L/min/m2] 2.1 L/min/m2  Intake/Output from previous day: 06/24 0701 - 06/25 0700 In: 7184.3 [I.V.:4721.5; Blood:631; IV Piggyback:1831.9] Out: 3329 [Urine:1940; Emesis/NG output:350; Chest Tube:1040] Intake/Output this shift: No intake/output data recorded.  General appearance: alert and cooperative Neurologic: intact Heart: regular rate and rhythm, S1, S2 normal, no murmur, click, rub or gallop Lungs: clear to auscultation bilaterally Abdomen: soft, non-tender; bowel sounds normal; no masses,  no organomegaly Extremities: edema 1+ Wound: dressed, dry  Lab Results: Recent Labs    06/26/19 0020 06/26/19 0020 06/26/19 0445 06/26/19 0445 06/26/19 0501 06/26/19 0607  WBC 8.5  --  7.1  --   --   --   HGB 10.7*   < > 10.2*   < > 9.9* 10.2*  HCT 31.9*   < > 30.6*   < > 29.0* 30.0*  PLT 151  --  137*  --   --   --    < > = values in this interval not displayed.   BMET:  Recent Labs    06/26/19 0027 06/26/19 0027 06/26/19 0445 06/26/19 0445 06/26/19 0501 06/26/19 0607  NA 133*   < > 131*   < > 135 135  K 4.7   < > 4.6   < > 4.6 4.9  CL 103  --  102  --   --   --   CO2 22   --  21*  --   --   --   GLUCOSE 144*  --  139*  --   --   --   BUN 12  --  12  --   --   --   CREATININE 1.00  --  0.95  --   --   --   CALCIUM 8.0*  --  7.9*  --   --   --    < > = values in this interval not displayed.    PT/INR:  Recent Labs    06/26/19 0445  LABPROT 15.4*  INR 1.3*   ABG    Component Value Date/Time   PHART 7.473 (H) 06/26/2019 0607   HCO3 24.0 06/26/2019 0607   TCO2 25 06/26/2019 0607   ACIDBASEDEF 2.0 06/24/2019 1357   O2SAT 97.0 06/26/2019 0607   CBG (last 3)  Recent Labs    06/26/19 0441 06/26/19 0539 06/26/19 0625  GLUCAP 137* 137* 144*    Assessment/Plan: S/P Procedure(s) (LRB): INSERTION OF IMPLANTABLE LEFT VENTRICULAR ASSIST DEVICE USING THORATEC HEARTMATE 3 (N/A) TRANSESOPHAGEAL ECHOCARDIOGRAM (TEE) (N/A) Extubate Diuresis Hold anticoag today Out of bed pulm toilet Leave chest tubes Continue pharmacol support of RV   LOS: 9 days    Aaron [name] 06/26/2019

## 2019-06-26 NOTE — Progress Notes (Signed)
OT Cancellation Note  Patient Details Name: KRISTIAN MOGG MRN: [id number] DOB: 1965-04-10   Cancelled Treatment:    Reason Eval/Treat Not Completed: Patient not medically ready (POD 1 -- order to start POD2 6/26 )  Jeri Modena 06/26/2019, 9:28 AM

## 2019-06-26 NOTE — Progress Notes (Signed)
RT placed pt on CPAP dream station for the night on auto titrate of max 8 min 4 w/6 LPM bled into the system. Pt respiratory status is stable at this time. RT will continue to monitor.

## 2019-06-26 NOTE — Progress Notes (Signed)
LVAD Coordinator Rounding Note:  Transferred from [facility] ED on 06/17/19 with STEMI. Cath revealed multi-vessel CAD, wire would not cross to open LAD; IABP placed and pt transferred here for evaluation for CABG.   Dr. Orvan Seen evaluated and found he was not amenable to surgical or percutaneous revascularization due to poor targets. With severe depressed LV function and little coronary perfusion, LVAD was recommended.   HM III LVAD implanted on 06/25/19 by Dr. Orvan Seen under Destination Therapy criteria due to recent smoking history.  Pt lying in bed awake, extubated earlier this am. He is awake, alert, and complaining of incisional pain. Elmyra Ricks [facility] nurse) just administered pain medication.  Encouraged patient to continue using Incentive Spirometer; also to use pillow to splint and perform deep breathing/coughing exercises. Smoking history have damaged his lungs, so he needs to focus the above exercises to prevent complications. Pt verbalized understanding of same.   Vital signs: Temp: 100.6 HR: 92 Doppler Pressure: not done A-Line: 86/73 (81) O2 Sat: 91% on 4 L/[place] Wt: 207>225.7 lbs   LVAD parameters: Speed:  5100 Flow: 4.3 Power:  3.5w PI: 2.9 Hct: 31  Alarms: none Events:  12 PI events today  Fixed speed: 5100 Low speed limit: 4800   Drive Line: left abdominal DL dressing C/D/I with anchor intact and accurately applied. Daily dressing changes per VAD Coordinator, Nurse Davonna Belling, or trained caregiver.   Labs:  LDH trend: 660>398  INR trend: 1.4>1.3  Anticoagulation Plan: -INR Goal:  2.0 - 5.5 -ASA Dose: 325 mg daily until INR therapeutic  Blood Products:  -Intra-op - 06/25/19>>6 units FFP  Device: N/A  Arrythmias: was on Amiodarone pre implant for NSVT  Respiratory: extubated 06/26/19  Nitric Oxide: off 06/26/19  VAD Education: 1. Elmyra Ricks [facility] RN) reviewed how to perform controller this am after patient extubated. Explained each soft key and function of each.  Also demonstrated controller self test. Pt verbalized understanding of same.   Plan/Recommendations:  1. Call VAD pager if any VAD equipment or drive line issues. 2. Daily dressing changes per VAD Coordinator, Nurse Davonna Belling, or trained caregiver.  Zada Girt RN, VAD Coordinator [phone] Pager: [phone]

## 2019-06-26 NOTE — Progress Notes (Signed)
RT assessed pts readiness to wean per heart wean protocol. Pt able to hold head off of bed for greater than 20 sec, stick tongue out, squeeze RTs hand, and wiggle toes. Pt placed in first phase of wean on SIMV/PRVC/PSV  600/4/40%/+5/10 PS. RT will continue to monitor.

## 2019-06-27 ENCOUNTER — Inpatient Hospital Stay (HOSPITAL_COMMUNITY): Payer: BC Managed Care – PPO

## 2019-06-27 LAB — BASIC METABOLIC PANEL
Anion gap: 9 (ref 5–15)
BUN: 17 mg/dL (ref 6–20)
CO2: 26 mmol/L (ref 22–32)
Calcium: 7.7 mg/dL — ABNORMAL LOW (ref 8.9–10.3)
Chloride: 97 mmol/L — ABNORMAL LOW (ref 98–111)
Creatinine, Ser: 1.07 mg/dL (ref 0.61–1.24)
GFR calc Af Amer: >60 mL/min (ref >60)
GFR calc non Af Amer: >60 mL/min (ref >60)
Glucose, Bld: 156 mg/dL — ABNORMAL HIGH (ref 70–99)
Potassium: 3.5 mmol/L (ref 3.5–5.1)
Sodium: 132 mmol/L — ABNORMAL LOW (ref 135–145)

## 2019-06-27 LAB — POCT I-STAT 7, (LYTES, BLD GAS, ICA,H+H)
Acid-Base Excess: 2 mmol/L (ref 0.0–2.0)
Bicarbonate: 25.8 mmol/L (ref 20.0–28.0)
Calcium, Ion: 1.12 mmol/L — ABNORMAL LOW (ref 1.15–1.40)
HCT: 29 % — ABNORMAL LOW (ref 39.0–52.0)
Hemoglobin: 9.9 g/dL — ABNORMAL LOW (ref 13.0–17.0)
O2 Saturation: 95 %
Patient temperature: 36
Potassium: 3.2 mmol/L — ABNORMAL LOW (ref 3.5–5.1)
Sodium: 134 mmol/L — ABNORMAL LOW (ref 135–145)
TCO2: 27 mmol/L (ref 22–32)
pCO2 arterial: 34.7 mmHg (ref 32.0–48.0)
pH, Arterial: 7.475 — ABNORMAL HIGH (ref 7.350–7.450)
pO2, Arterial: 67 mmHg — ABNORMAL LOW (ref 83.0–108.0)

## 2019-06-27 LAB — GLUCOSE, CAPILLARY
Glucose-Capillary: 113 mg/dL — ABNORMAL HIGH (ref 70–99)
Glucose-Capillary: 119 mg/dL — ABNORMAL HIGH (ref 70–99)
Glucose-Capillary: 136 mg/dL — ABNORMAL HIGH (ref 70–99)
Glucose-Capillary: 137 mg/dL — ABNORMAL HIGH (ref 70–99)
Glucose-Capillary: 142 mg/dL — ABNORMAL HIGH (ref 70–99)
Glucose-Capillary: 159 mg/dL — ABNORMAL HIGH (ref 70–99)
Glucose-Capillary: 159 mg/dL — ABNORMAL HIGH (ref 70–99)
Glucose-Capillary: 196 mg/dL — ABNORMAL HIGH (ref 70–99)
Glucose-Capillary: 90 mg/dL (ref 70–99)

## 2019-06-27 LAB — CBC WITH DIFFERENTIAL/PLATELET
Abs Immature Granulocytes: 0.06 10*3/uL (ref 0.00–0.07)
Basophils Absolute: 0 10*3/uL (ref 0.0–0.1)
Basophils Relative: 0 %
Eosinophils Absolute: 0.1 10*3/uL (ref 0.0–0.5)
Eosinophils Relative: 1 %
HCT: 29.6 % — ABNORMAL LOW (ref 39.0–52.0)
Hemoglobin: 9.7 g/dL — ABNORMAL LOW (ref 13.0–17.0)
Immature Granulocytes: 1 %
Lymphocytes Relative: 13 %
Lymphs Abs: 1.2 10*3/uL (ref 0.7–4.0)
MCH: 32.8 pg (ref 26.0–34.0)
MCHC: 32.8 g/dL (ref 30.0–36.0)
MCV: 100 fL (ref 80.0–100.0)
Monocytes Absolute: 1 10*3/uL (ref 0.1–1.0)
Monocytes Relative: 10 %
Neutro Abs: 7 10*3/uL (ref 1.7–7.7)
Neutrophils Relative %: 75 %
Platelets: 153 10*3/uL (ref 150–400)
RBC: 2.96 MIL/uL — ABNORMAL LOW (ref 4.22–5.81)
RDW: 13.2 % (ref 11.5–15.5)
WBC: 9.4 10*3/uL (ref 4.0–10.5)
nRBC: 0 % (ref 0.0–0.2)

## 2019-06-27 LAB — MAGNESIUM: Magnesium: 2.2 mg/dL (ref 1.7–2.4)

## 2019-06-27 LAB — COOXEMETRY PANEL
Carboxyhemoglobin: 1.1 % (ref 0.5–1.5)
Methemoglobin: 0.6 % (ref 0.0–1.5)
O2 Saturation: 70.1 %
Total hemoglobin: 10.6 g/dL — ABNORMAL LOW (ref 12.0–16.0)

## 2019-06-27 LAB — COMPREHENSIVE METABOLIC PANEL
ALT: 180 U/L — ABNORMAL HIGH (ref 0–44)
AST: 165 U/L — ABNORMAL HIGH (ref 15–41)
Albumin: 3 g/dL — ABNORMAL LOW (ref 3.5–5.0)
Alkaline Phosphatase: 45 U/L (ref 38–126)
Anion gap: 11 (ref 5–15)
BUN: 16 mg/dL (ref 6–20)
CO2: 23 mmol/L (ref 22–32)
Calcium: 8.1 mg/dL — ABNORMAL LOW (ref 8.9–10.3)
Chloride: 98 mmol/L (ref 98–111)
Creatinine, Ser: 1.27 mg/dL — ABNORMAL HIGH (ref 0.61–1.24)
GFR calc Af Amer: >60 mL/min (ref >60)
GFR calc non Af Amer: >60 mL/min (ref >60)
Glucose, Bld: 117 mg/dL — ABNORMAL HIGH (ref 70–99)
Potassium: 3.2 mmol/L — ABNORMAL LOW (ref 3.5–5.1)
Sodium: 132 mmol/L — ABNORMAL LOW (ref 135–145)
Total Bilirubin: 1.4 mg/dL — ABNORMAL HIGH (ref 0.3–1.2)
Total Protein: 5.4 g/dL — ABNORMAL LOW (ref 6.5–8.1)

## 2019-06-27 LAB — PHOSPHORUS: Phosphorus: 4 mg/dL (ref 2.5–4.6)

## 2019-06-27 LAB — PROTIME-INR
INR: 1.3 — ABNORMAL HIGH (ref 0.8–1.2)
Prothrombin Time: 15.3 seconds — ABNORMAL HIGH (ref 11.4–15.2)

## 2019-06-27 LAB — HEPARIN LEVEL (UNFRACTIONATED): Heparin Unfractionated: <0.1 IU/mL — ABNORMAL LOW (ref 0.30–0.70)

## 2019-06-27 LAB — LACTATE DEHYDROGENASE: LDH: 358 U/L — ABNORMAL HIGH (ref 98–192)

## 2019-06-27 MED ORDER — INSULIN ASPART 100 UNIT/ML ~~LOC~~ SOLN
0.0000 [IU] | Freq: Three times a day (TID) | SUBCUTANEOUS | Status: DC
  Administered 2019-06-27: 2 [IU] via SUBCUTANEOUS
  Administered 2019-06-27 (×2): 3 [IU] via SUBCUTANEOUS
  Administered 2019-06-28 – 2019-06-30 (×4): 2 [IU] via SUBCUTANEOUS
  Administered 2019-06-30 – 2019-07-02 (×3): 3 [IU] via SUBCUTANEOUS
  Administered 2019-07-02: 2 [IU] via SUBCUTANEOUS
  Administered 2019-07-02: 5 [IU] via SUBCUTANEOUS
  Administered 2019-07-03: 3 [IU] via SUBCUTANEOUS
  Administered 2019-07-03: 5 [IU] via SUBCUTANEOUS
  Administered 2019-07-04: 2 [IU] via SUBCUTANEOUS
  Administered 2019-07-04 (×2): 3 [IU] via SUBCUTANEOUS
  Administered 2019-07-05 – 2019-07-06 (×3): 2 [IU] via SUBCUTANEOUS
  Administered 2019-07-07 – 2019-07-08 (×3): 3 [IU] via SUBCUTANEOUS
  Administered 2019-07-09: 5 [IU] via SUBCUTANEOUS

## 2019-06-27 MED ORDER — HEPARIN (PORCINE) 25000 UT/250ML-% IV SOLN
1100.0000 [IU]/h | INTRAVENOUS | Status: DC
  Administered 2019-06-27: 500 [IU]/h via INTRAVENOUS
  Administered 2019-06-28: 950 [IU]/h via INTRAVENOUS
  Administered 2019-06-30: 1000 [IU]/h via INTRAVENOUS
  Administered 2019-07-01: 1100 [IU]/h via INTRAVENOUS
  Administered 2019-07-01: 1000 [IU]/h via INTRAVENOUS
  Administered 2019-07-02 – 2019-07-03 (×3): 1100 [IU]/h via INTRAVENOUS
  Filled 2019-06-27 (×7): qty 250

## 2019-06-27 MED ORDER — WARFARIN SODIUM 2.5 MG PO TABS
2.5000 mg | ORAL_TABLET | Freq: Once | ORAL | Status: AC
  Administered 2019-06-27: 2.5 mg via ORAL
  Filled 2019-06-27: qty 1

## 2019-06-27 MED ORDER — POTASSIUM CHLORIDE CRYS ER 20 MEQ PO TBCR
20.0000 meq | EXTENDED_RELEASE_TABLET | ORAL | Status: AC
  Administered 2019-06-27 (×3): 20 meq via ORAL
  Filled 2019-06-27 (×3): qty 1

## 2019-06-27 MED ORDER — FUROSEMIDE 10 MG/ML IJ SOLN
INTRAMUSCULAR | Status: AC
  Administered 2019-06-27: 80 mg via INTRAVENOUS
  Filled 2019-06-27: qty 8

## 2019-06-27 MED ORDER — WARFARIN - PHARMACIST DOSING INPATIENT
Freq: Every day | Status: DC
  Administered 2019-07-01: 1

## 2019-06-27 MED ORDER — BISACODYL 10 MG RE SUPP
10.0000 mg | Freq: Every day | RECTAL | Status: DC | PRN
  Administered 2019-06-27: 10 mg via RECTAL
  Filled 2019-06-27: qty 1

## 2019-06-27 MED ORDER — FUROSEMIDE 10 MG/ML IJ SOLN: 80.0000 mg | Freq: Once | INTRAMUSCULAR | Status: AC

## 2019-06-27 MED ORDER — INSULIN ASPART 100 UNIT/ML ~~LOC~~ SOLN: 0.0000 [IU] | SUBCUTANEOUS | Status: DC

## 2019-06-27 MED ORDER — POTASSIUM CHLORIDE CRYS ER 20 MEQ PO TBCR
20.0000 meq | EXTENDED_RELEASE_TABLET | ORAL | Status: AC
  Administered 2019-06-27 – 2019-06-28 (×3): 20 meq via ORAL
  Filled 2019-06-27 (×3): qty 1

## 2019-06-27 MED ORDER — ALBUMIN HUMAN 5 % IV SOLN
12.5000 g | Freq: Once | INTRAVENOUS | Status: AC
  Administered 2019-06-27: 12.5 g via INTRAVENOUS
  Filled 2019-06-27: qty 250

## 2019-06-27 NOTE — Evaluation (Signed)
Physical Therapy Evaluation Patient Details Name: Aaron [name] MRN: [id number] DOB: September 29, 1965 Today's Date: 06/27/2019   History of Present Illness  Pt is a 54 y/o male admitted on 06/17/2019 with PMH of untreated HTNand tobacco use (30 pack years)but no prior cardiac history, admitted to [facility] 6/16 w/ acute anterolateral STEMI. Emergent cath showed severe multivessel disease. Pt was transferred to [facility] for further management. Cardiac cath demonstrated no viable targets for CABG. Work-up for LVAD was performed. 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     Clinical Impression  Pt presented supine in bed with HOB elevated, awake and willing to participate in therapy session. Prior to admission, pt reported that he was independent with all functional mobility and ADLs. Pt reported that he lives alone with his dog "[name]". He stated that his sister will be available to assist him upon d/c. At the time of evaluation, pt overall tolerating movement very well. He performed bed mobility with min A, transfers with min Ax2 and ambulated a short distance in room with min A x2. Pt unsteady with standing and walking but RN reporting pt ambulated a lap around the unit earlier today without difficulties. Pt on RA throughout with all VSS. RN was present throughout entire session. Hopeful that pt will make steady progress with functional mobility once more lines/tubes are discontinued. Pt would continue to benefit from skilled physical therapy services at this time while admitted and after d/c to address the below listed limitations in order to improve overall safety and independence with functional mobility.  LVAD settings:  Pump Flow = 4.1 Speed = 5200 PI = 3.2 Pump Power = 3.6     Follow Up Recommendations Home health PT;Supervision/Assistance - 24 hour    Equipment Recommendations  Other (comment) (TBD pending progress as lines/tubes are d/c'd)    Recommendations for Other Services        Precautions / Restrictions Precautions Precautions: Fall;Other (comment) (LVAD) Precaution Comments: LVAD, chest tubes x2, sternal precautions Restrictions Weight Bearing Restrictions: Yes Other Position/Activity Restrictions: STERNAL PRECAUTIONS      Mobility  Bed Mobility Overal bed mobility: Needs Assistance Bed Mobility: Rolling;Sidelying to Sit Rolling: Min guard Sidelying to sit: Min assist;+2 for safety/equipment;+2 for physical assistance       General bed mobility comments: cueing for sequencing and technique, assistance needed for trunk elevation and line management  Transfers Overall transfer level: Needs assistance Equipment used: 2 person hand held assist Transfers: Sit to/from Stand Sit to Stand: Min assist;+2 safety/equipment         General transfer comment: pt performed x1 from EOB and x2 from recliner chair, min A x2 for stability and line management  Ambulation/Gait Ambulation/Gait assistance: Min assist;+2 physical assistance;+2 safety/equipment Gait Distance (Feet): 2 Feet Assistive device: 2 person hand held assist Gait Pattern/deviations: Step-through pattern;Decreased step length - right;Decreased step length - left;Decreased stride length Gait velocity: decr   General Gait Details: pt able to take a few small shuffling steps forwards from bed to chair; assistance needed for stability and line management  Stairs            Wheelchair Mobility    Modified Rankin (Stroke Patients Only)       Balance Overall balance assessment: Needs assistance Sitting-balance support: Feet supported Sitting balance-Leahy Scale: Good     Standing balance support: During functional activity;Single extremity supported;Bilateral upper extremity supported Standing balance-Leahy Scale: Poor  Pertinent Vitals/Pain Pain Assessment: Faces Faces Pain Scale: Hurts little more Pain Location: abdomen Pain  Descriptors / Indicators: Sore;Guarding Pain Intervention(s): Monitored during session;Repositioned    Home Living Family/patient expects to be discharged to:: Private residence Living Arrangements: Alone Available Help at Discharge: Family;Available 24 hours/day Type of Home: House Home Access: Stairs to enter Entrance Stairs-Rails: Right;Left;Can reach both Entrance Stairs-Number of Steps: 3 Home Layout: One level Home Equipment: None      Prior Function Level of Independence: Independent         Comments: works in Education officer, museum; occasionally wears glasses for reading     Hand Dominance   Dominant Hand: Right    Extremity/Trunk Assessment   Upper Extremity Assessment Upper Extremity Assessment: Defer to OT evaluation;Overall WFL for tasks assessed    Lower Extremity Assessment Lower Extremity Assessment: Overall WFL for tasks assessed    Cervical / Trunk Assessment Cervical / Trunk Assessment: Other exceptions Cervical / Trunk Exceptions: sternal precautions  Communication   Communication: No difficulties  Cognition Arousal/Alertness: Awake/alert Behavior During Therapy: WFL for tasks assessed/performed Overall Cognitive Status: Within Functional Limits for tasks assessed                                        General Comments      Exercises General Exercises - Lower Extremity Mini-Sqauts: Strengthening;5 reps;Standing   Assessment/Plan    PT Assessment Patient needs continued PT services  PT Problem List Cardiopulmonary status limiting activity;Decreased balance;Decreased mobility;Decreased coordination;Decreased knowledge of use of DME;Decreased safety awareness;Decreased knowledge of precautions;Pain       PT Treatment Interventions DME instruction;Gait training;Stair training;Functional mobility training;Therapeutic activities;Therapeutic exercise;Balance training;Neuromuscular re-education;Patient/family education    PT  Goals (Current goals can be found in the Care Plan section)  Acute Rehab PT Goals Patient Stated Goal: to get back home to his dog "[name]" PT Goal Formulation: With patient Time For Goal Achievement: 07/11/19 Potential to Achieve Goals: Good    Frequency Min 5X/week   Barriers to discharge        Co-evaluation PT/OT/SLP Co-Evaluation/Treatment: Yes Reason for Co-Treatment: To address functional/ADL transfers;For patient/therapist safety PT goals addressed during session: Mobility/safety with mobility;Balance;Strengthening/ROM         AM-PAC PT "6 Clicks" Mobility  Outcome Measure Help needed turning from your back to your side while in a flat bed without using bedrails?: A Little Help needed moving from lying on your back to sitting on the side of a flat bed without using bedrails?: A Little Help needed moving to and from a bed to a chair (including a wheelchair)?: A Little Help needed standing up from a chair using your arms (e.g., wheelchair or bedside chair)?: A Little Help needed to walk in hospital room?: A Little Help needed climbing 3-5 steps with a railing? : A Lot 6 Click Score: 17    End of Session   Activity Tolerance: Patient tolerated treatment well Patient left: in chair;with call bell/phone within reach;Other (comment) (RN present in room) Nurse Communication: Mobility status PT Visit Diagnosis: Other abnormalities of gait and mobility (R26.89)    Time: 3016-0109 PT Time Calculation (min) (ACUTE ONLY): 37 min   Charges:   PT Evaluation $PT Eval Moderate Complexity: 1 Mod          Eduard Clos, PT, DPT  Acute Rehabilitation Services Pager [phone] Office [place] 06/27/2019, 3:56  PM

## 2019-06-27 NOTE — Progress Notes (Signed)
[place] for IV heparin, Coumadin Indication: LVAD  Allergies  Allergen Reactions  . Codeine Hives    Patient Measurements: Height: 5\' 9"  (175.3 cm) Weight: 100 kg (220 lb 7.4 oz) IBW/kg (Calculated) : 70.7 Heparin Dosing Weight: ~ 92 kg  Vital Signs: Temp: 96.2 F (35.7 C) (06/26 1139) Temp Source: Axillary (06/26 1139) BP: 73/62 (06/26 1300) Pulse Rate: 50 (06/26 1315)  Labs: Recent Labs    06/24/19 2143 06/25/19 0345 06/25/19 0819 06/25/19 1233 06/25/19 1242 06/26/19 0445 06/26/19 0501 06/26/19 1735 06/26/19 1735 06/27/19 0437 06/27/19 0500 06/27/19 1349  HGB  --  15.7   < > 10.9*   < > 10.2*   < > 10.6*   < > 9.9* 9.7*  --   HCT  --  46.2   < > 32.1*   < > 30.6*   < > 32.0*  --  29.0* 29.6*  --   PLT  --  190   < > 118*  118*   < > 137*  --  186  --   --  153  --   APTT  --  63*  --  39*  39*  --   --   --   --   --   --   --   --   LABPROT  --   --   --  16.3*  15.9*  --  15.4*  --   --   --   --  15.3*  --   INR  --   --   --  1.4*  1.3*  --  1.3*  --   --   --   --  1.3*  --   HEPARINUNFRC 0.16* 0.32  --   --   --   --   --   --   --   --   --  <0.10*  CREATININE  --  1.21   < > 0.97   < > 0.95  --  1.23  --   --  1.27*  --    < > = values in this interval not displayed.    Estimated Creatinine Clearance: 76.6 mL/min (A) (by C-G formula based on SCr of 1.27 mg/dL (H)).   Medical History: Past Medical History:  Diagnosis Date  . Acute anterolateral wall MI ([place])   . CAD, multiple vessel   . Hypertension   . Ischemic cardiomyopathy   . Systolic heart failure ([place])   . Tobacco abuse     Medications:  Infusions:  . sodium chloride Stopped (06/27/19 6962)  . sodium chloride    . sodium chloride    . sodium chloride    . sodium chloride 10 mL/hr at 06/26/19 2300  . amiodarone 30 mg/hr (06/27/19 1300)  . epinephrine Stopped (06/27/19 0006)  . fentaNYL infusion INTRAVENOUS Stopped (06/26/19 0502)  .  furosemide (LASIX) infusion 4 mg/hr (06/27/19 1300)  . heparin 500 Units/hr (06/27/19 1300)  . lactated ringers    . lactated ringers    . lactated ringers Stopped (06/27/19 0801)  . milrinone 0.125 mcg/kg/min (06/27/19 1300)  . norepinephrine (LEVOPHED) Adult infusion 6 mcg/min (06/27/19 1337)  . vasopressin (PITRESSIN) infusion - *FOR SHOCK* Stopped (06/26/19 1823)    Assessment: 54 yo male s/p LVAD placement 6/24.  Pharmacy asked to begin anticoagulation with IV heparin and Coumadin today.  No overt bleeding or complications noted.  Hgb low but stable post-op.  Per discussion with  Dr. Orvan Seen, will initiate heparin at 500 units/hr for today without specific heparin level goal (keep < 0.2)  PM f/u - heparin level undetectable this afternoon as expected.  No plans to titrate for now.  Goal of Therapy:  INR 2-2.5  Will not target specific heparin level yet. Monitor platelets by anticoagulation protocol: Yes   Plan:  1. Coumadin 2.5 mg po x 1 tonight. 2. Continue IV Heparin 500 units/hr IV. 3. Daily heparin level, CBC and INR.  [place] Crane, Roylene Reason, BCCP Clinical Pharmacist  06/27/2019 3:16 PM   [facility] pharmacy phone numbers are listed on [email or web address]

## 2019-06-27 NOTE — Progress Notes (Signed)
ANTICOAGULATION CONSULT NOTE - Initial Consult  Pharmacy Consult for IV heparin, Coumadin Indication: LVAD  Allergies  Allergen Reactions  . Codeine Hives    Patient Measurements: Height: 5\' 9"  (175.3 cm) Weight: 100 kg (220 lb 7.4 oz) IBW/kg (Calculated) : 70.7 Heparin Dosing Weight: ~ 92 kg  Vital Signs: Temp: 97.9 F (36.6 C) (06/26 0700) Temp Source: Core (Comment) (06/26 0400) BP: 63/48 (06/26 0500) Pulse Rate: 86 (06/26 0700)  Labs: Recent Labs    06/24/19 1357 06/24/19 2143 06/25/19 0345 06/25/19 0819 06/25/19 1233 06/25/19 1242 06/26/19 0445 06/26/19 0501 06/26/19 1735 06/26/19 1735 06/27/19 0437 06/27/19 0500  HGB   < >  --  15.7   < > 10.9*   < > 10.2*   < > 10.6*   < > 9.9* 9.7*  HCT   < >  --  46.2   < > 32.1*   < > 30.6*   < > 32.0*  --  29.0* 29.6*  PLT  --   --  190   < > 118*  118*   < > 137*  --  186  --   --  153  APTT  --   --  63*  --  39*  39*  --   --   --   --   --   --   --   LABPROT  --   --   --   --  16.3*  15.9*  --  15.4*  --   --   --   --  15.3*  INR  --   --   --   --  1.4*  1.3*  --  1.3*  --   --   --   --  1.3*  HEPARINUNFRC  --  0.16* 0.32  --   --   --   --   --   --   --   --   --   CREATININE  --   --  1.21   < > 0.97   < > 0.95  --  1.23  --   --  1.27*   < > = values in this interval not displayed.    Estimated Creatinine Clearance: 76.6 mL/min (A) (by C-G formula based on SCr of 1.27 mg/dL (H)).   Medical History: Past Medical History:  Diagnosis Date  . Acute anterolateral wall MI ([place])   . CAD, multiple vessel   . Hypertension   . Ischemic cardiomyopathy   . Systolic heart failure ([place])   . Tobacco abuse     Medications:  Infusions:  . sodium chloride Stopped (06/27/19 5053)  . sodium chloride    . sodium chloride Stopped (06/18/19 0953)  . sodium chloride Stopped (06/19/19 1446)  . sodium chloride    . sodium chloride    . sodium chloride    . sodium chloride    . sodium chloride 10 mL/hr at  06/26/19 2300  . amiodarone 30 mg/hr (06/27/19 0700)  . cefUROXime (ZINACEF)  IV 1.5 g (06/27/19 0801)  . dexmedetomidine (PRECEDEX) IV infusion Stopped (06/26/19 0442)  . epinephrine Stopped (06/27/19 0006)  . fentaNYL infusion INTRAVENOUS Stopped (06/26/19 0502)  . furosemide (LASIX) infusion Stopped (06/27/19 0221)  . heparin    . insulin 1.6 Units/hr (06/27/19 0758)  . lactated ringers    . lactated ringers    . lactated ringers 20 mL/hr at 06/27/19 0700  . milrinone 0.125 mcg/kg/min (06/27/19 0700)  .  norepinephrine (LEVOPHED) Adult infusion 8 mcg/min (06/27/19 0700)  . vasopressin (PITRESSIN) infusion - *FOR SHOCK* Stopped (06/26/19 1823)    Assessment: 54 yo male s/p LVAD placement 6/24.  Pharmacy asked to begin anticoagulation with IV heparin and Coumadin today.  No overt bleeding or complications noted.  Hgb low but stable post-op.  Per discussion with Dr. Orvan Seen, will initiate heparin at 500 units/hr for today without specific heparin level goal (keep < 0.2)  Goal of Therapy:  INR 2-2.5  Will not target specific heparin level yet. Monitor platelets by anticoagulation protocol: Yes   Plan:  1. Coumadin 2.5 mg po x 1 tonight. 2. Start Heparin 500 units/hr IV. 3. Check heparin level in 6 hrs - will likely be undetectable. 4. Daily heparin level, CBC and INR.  [place] Crane, Roylene Reason, BCCP Clinical Pharmacist  06/27/2019 8:25 AM   [facility] pharmacy phone numbers are listed on [email or web address]

## 2019-06-27 NOTE — Progress Notes (Signed)
Pt not tolerating CPAP. Wore device for approx 2 hrs and subsequently removed. Pt states that pressure is "too much" and that the air is too cold. 4L [place] placed on pt instead, satting appropriately. RN will continue to monitor.

## 2019-06-27 NOTE — Progress Notes (Signed)
06/27/19 1600 LVAD dressing changed by this RN and Luberta Mutter RN. No redness or drainage noted. Driveline anchor intact.    Next LVAD dressing change due 06/28/19

## 2019-06-27 NOTE — Progress Notes (Signed)
Occupational Therapy Evaluation  PTA, pt lived alone with his dog "[name]" was independent with mobility and ADL and worked in [employer] Pt had walked this am with nsg staff. Able to mobilize with +2 min A due to unsteadiness and line maintenace. Increased assistance required for ADL at this time due generalized discomfort and limitations in movement at this time. Complaining of abdominal discomfort and states he feels "bloated" - nsg aware. Pt will most likely progress to DC home with HHOT (pending progress). Pt states his sister will be able to assist at DC. Began education on importance of identifying drive line and making sure control box is secured and line is free of being "tugged" before mobilizing. Will follow acutely.     06/27/19 1600  OT Visit Information  Last OT Received On 06/27/19  Assistance Needed +2 (for line management)  PT/OT/SLP Co-Evaluation/Treatment Yes  Reason for Co-Treatment Complexity of the patient's impairments (multi-system involvement);For patient/therapist safety;To address functional/ADL transfers  OT goals addressed during session ADL's and self-care  History of Present Illness Pt is a 54 y/o male admitted on 06/17/2019 with PMH of untreated HTNand tobacco use (30 pack years)but no prior cardiac history, admitted to [facility] 6/16 w/ acute anterolateral STEMI. Emergent cath showed severe multivessel disease. Pt was transferred to [facility] for further management. Cardiac cath demonstrated no viable targets for CABG. Work-up for LVAD was performed. 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   Precautions  Precautions Fall;Other (comment) (LVAD)  Precaution Comments LVAD, chest tubes x2, sternal precautions  Restrictions  Other Position/Activity Restrictions [place] expects to be discharged to: Private residence  Living Arrangements Alone  Available Help at Discharge Family;Available 24 hours/day  Type of  Home House  Home Access Stairs to enter  Entrance Stairs-Number of Steps 3  Entrance Stairs-Rails Right;Left;Can reach both  Home Layout One level  Bathroom Shower/Tub Tub/shower unit;Curtain  Corporate treasurer No  Home Equipment None  Prior Function  Level of Independence Independent  Comments works in Education officer, museum; occasionally wears glasses for reading; has a Insurance claims handler; enjoys "Sales executive No difficulties  Pain Assessment  Pain Assessment Faces  Faces Pain Scale 4  Pain Location abdomen  Pain Descriptors / Indicators Sore;Guarding  Pain Intervention(s) Limited activity within patient's tolerance  Cognition  Arousal/Alertness Awake/alert  Behavior During Therapy Flat affect (appears overwhelmed at times)  Overall Cognitive Status Within Functional Limits for tasks assessed  Upper Extremity Assessment  Upper Extremity Assessment Overall WFL for tasks assessed  Lower Extremity Assessment  Lower Extremity Assessment Defer to PT evaluation  Cervical / Trunk Assessment  Cervical / Trunk Assessment Other exceptions  Cervical / Trunk Exceptions sternal precautions  ADL  Overall ADL's  Needs assistance/impaired  Grooming Minimal assistance  Grooming Details (indicate cue type and reason) due to pain and lines  Upper Body Bathing Minimal assistance;Sitting  Lower Body Bathing Moderate assistance;Sit to/from stand  Upper Body Dressing  Moderate assistance  Lower Body Dressing Maximal assistance;Sit to/from Engineer, manufacturing for physical assistance  Toileting- Clothing Manipulation and Hygiene Maximal assistance  Functional mobility during ADLs Minimal assistance;+2 for physical assistance  General ADL Comments usually ablet o complete figure positioning however unable to complete at this time  Vision- History  Baseline Vision/History Wears glasses  Wears Glasses Reading only   Bed Mobility  Overal bed mobility Needs Assistance  Bed Mobility Rolling;Sidelying to Sit  Rolling Min guard  Sidelying to sit Min assist;+2 for safety/equipment;+2 for physical assistance  General bed mobility comments cueing for sequencing and technique, assistance needed for trunk elevation and line management  Transfers  Overall transfer level Needs assistance  Equipment used 2 person hand held assist  Transfers Sit to/from Stand  Sit to Stand Min assist;+2 safety/equipment  General transfer comment pt performed x1 from EOB and x2 from recliner chair, min A x2 for stability and line management  Balance  Overall balance assessment Needs assistance  Sitting-balance support Feet supported  Sitting balance-Leahy Scale Good  Standing balance support During functional activity;Single extremity supported;Bilateral upper extremity supported  Standing balance-Leahy Scale Poor  General Exercises - Lower Extremity  Mini-Sqauts Strengthening;5 reps;Standing  OT - End of Session  Activity Tolerance Patient tolerated treatment well  Patient left in chair;with call bell/phone within reach  Nurse Communication Mobility status  OT Assessment  OT Recommendation/Assessment Patient needs continued OT Services  OT Visit Diagnosis Unsteadiness on feet (R26.81);Muscle weakness (generalized) (M62.81);Pain  Pain - part of body  (abdomen; chest)  OT Problem List Decreased strength;Decreased activity tolerance;Impaired balance (sitting and/or standing);Decreased safety awareness;Decreased knowledge of use of DME or AE;Decreased knowledge of precautions;Cardiopulmonary status limiting activity;Obesity;Pain  OT Plan  OT Frequency (ACUTE ONLY) Min 2X/week  OT Treatment/Interventions (ACUTE ONLY) Self-care/ADL training;Therapeutic exercise;Energy conservation;DME and/or AE instruction;Therapeutic activities;Patient/family education;Balance training;Cognitive remediation/compensation  AM-PAC OT "6 Clicks"  Daily Activity Outcome Measure (Version 2)  Help from another person eating meals? 4  Help from another person taking care of personal grooming? 3  Help from another person toileting, which includes using toliet, bedpan, or urinal? 2  Help from another person bathing (including washing, rinsing, drying)? 2  Help from another person to put on and taking off regular upper body clothing? 2  Help from another person to put on and taking off regular lower body clothing? 2  6 Click Score 15  OT Recommendation  Follow Up Recommendations Supervision - Intermittent;Home health OT (pending progress)  OT Equipment 3 in 1 bedside commode  Individuals Consulted  Consulted and Agree with Results and Recommendations Patient  Acute Rehab OT Goals  Patient Stated Goal to get back home to his dog "[name]"  OT Goal Formulation With patient  Time For Goal Achievement 07/11/19  Potential to Achieve Goals Good  OT Time Calculation  OT Start Time (ACUTE ONLY) 1335  OT Stop Time (ACUTE ONLY) 1416  OT Time Calculation (min) 41 min  OT General Charges  $OT Visit 1 Visit  OT Evaluation  $OT Eval High Complexity 1 High  Written Expression  Dominant Hand Right  Maurie Boettcher, OT/L   Acute OT Clinical Specialist [place] Pager [phone] Office [phone]

## 2019-06-27 NOTE — Progress Notes (Signed)
Patient ID: Aaron [name], male   DOB: 1965/10/23, 54 y.o.   MRN: [id number]     Advanced Heart Failure Rounding Note  PCP-Cardiologist: No primary care provider on file.   Subjective:    - HM3 placement 6/24  He is on milrinone 0.125, NE 8.  Off epinephrine.  MAP 70-80.    CVP 3 last night, Lasix gtt stopped. CVP back up to 10.  I/Os negative yesterday with weight down.   Walked this morning, no complaints.   Swan #s CVP 10 PA 36/18 CI 2.1  Co-ox 70%   LVAD Interrogation HM III: Speed: 5200 Flow: 4.4 PI:2.5 Power: 4.4.  No PI events.   Objective:   Weight Range: 100 kg Body mass index is 32.56 kg/m.   Vital Signs:   Temp:  [96.4 F (35.8 C)-102.2 F (39 C)] 97.9 F (36.6 C) (06/26 0700) Pulse Rate:  [27-156] 86 (06/26 0700) Resp:  [9-33] 21 (06/26 0700) BP: (59-116)/(32-100) 63/48 (06/26 0500) SpO2:  [48 %-100 %] 93 % (06/26 0700) Arterial Line BP: (65-116)/(50-91) 72/62 (06/26 0700) Weight:  [100 kg] 100 kg (06/26 0500) Last BM Date: 06/24/19  Weight change: Filed Weights   06/25/19 0500 06/26/19 0500 06/27/19 0500  Weight: 93.9 kg 102.4 kg 100 kg    Intake/Output:   Intake/Output Summary (Last 24 hours) at 06/27/2019 0830 Last data filed at 06/27/2019 0700 Gross per 24 hour  Intake 3041.7 ml  Output 3450 ml  Net -408.3 ml      Physical Exam   General: Well appearing this am. NAD.  HEENT: Normal. Neck: Supple, JVP 10 cm. Carotids OK.  Cardiac:  Mechanical heart sounds with LVAD hum present.  Lungs:  CTAB, normal effort.  Abdomen:  NT, ND, no HSM. No bruits or masses. +BS  LVAD exit site: Well-healed and incorporated. Dressing dry and intact. No erythema or drainage. Stabilization device present and accurately applied. Driveline dressing changed daily per sterile technique. Extremities:  Warm and dry. No cyanosis, clubbing, rash, or edema.  Neuro:  Alert & oriented x 3. Cranial nerves grossly intact. Moves all 4 extremities w/o difficulty. Affect  pleasant     Telemetry   NSR with occasional PVCs (personally reviewed).    Labs    CBC Recent Labs    06/26/19 0445 06/26/19 0501 06/26/19 1735 06/26/19 1735 06/27/19 0437 06/27/19 0500  WBC 7.1   < > 12.8*  --   --  9.4  NEUTROABS 5.7  --   --   --   --  7.0  HGB 10.2*   < > 10.6*   < > 9.9* 9.7*  HCT 30.6*   < > 32.0*   < > 29.0* 29.6*  MCV 99.7   < > 100.0  --   --  100.0  PLT 137*   < > 186  --   --  153   < > = values in this interval not displayed.   Basic Metabolic Panel Recent Labs    06/26/19 0445 06/26/19 0501 06/26/19 1735 06/26/19 1735 06/27/19 0437 06/27/19 0500  NA 131*   < > 131*   < > 134* 132*  K 4.6   < > 4.0   < > 3.2* 3.2*  CL 102   < > 98  --   --  98  CO2 21*   < > 22  --   --  23  GLUCOSE 139*   < > 161*  --   --  117*  BUN 12   < > 15  --   --  16  CREATININE 0.95   < > 1.23  --   --  1.27*  CALCIUM 7.9*   < > 8.1*  --   --  8.1*  MG 2.4   < > 2.4  --   --  2.2  PHOS 4.1  --   --   --   --  4.0   < > = values in this interval not displayed.   Liver Function Tests Recent Labs    06/26/19 0445 06/27/19 0500  AST 88* 165*  ALT 86* 180*  ALKPHOS 47 45  BILITOT 1.1 1.4*  PROT 5.4* 5.4*  ALBUMIN 3.0* 3.0*   No results for input(s): LIPASE, AMYLASE in the last 72 hours. Cardiac Enzymes No results for input(s): CKTOTAL, CKMB, CKMBINDEX, TROPONINI in the last 72 hours.  BNP: BNP (last 3 results) Recent Labs    06/17/19 2023 06/18/19 0119 06/26/19 0445  BNP 751.1* 1,068.5* 1,301.1*    ProBNP (last 3 results) No results for input(s): PROBNP in the last 8760 hours.   D-Dimer Recent Labs    06/25/19 1233  DDIMER 3.48*   Hemoglobin A1C No results for input(s): HGBA1C in the last 72 hours. Fasting Lipid Panel No results for input(s): CHOL, HDL, LDLCALC, TRIG, CHOLHDL, LDLDIRECT in the last 72 hours. Thyroid Function Tests No results for input(s): TSH, T4TOTAL, T3FREE, THYROIDAB in the last 72 hours.  Invalid  input(s): FREET3  Other results:   Imaging    No results found.   Medications:     Scheduled Medications: . acetaminophen  1,000 mg Oral Q6H   Or  . acetaminophen (TYLENOL) oral liquid 160 mg/5 mL  1,000 mg Per Tube Q6H  . aspirin EC  325 mg Oral Daily   Or  . aspirin  324 mg Per Tube Daily   Or  . aspirin  300 mg Rectal Daily  . atorvastatin  80 mg Oral Daily  . bisacodyl  10 mg Oral Daily   Or  . bisacodyl  10 mg Rectal Daily  . Chlorhexidine Gluconate Cloth  6 each Topical Daily  . clonazePAM  0.5 mg Oral BID  . docusate sodium  200 mg Oral Daily  . folic acid  1 mg Oral Daily  . ketorolac  7.5 mg Intravenous Q6H  . mouth rinse  15 mL Mouth Rinse BID  . multivitamin with minerals  1 tablet Oral Daily  . pantoprazole  40 mg Oral Daily  . potassium chloride  20 mEq Oral Q4H  . sodium chloride flush  10-40 mL Intracatheter Q12H  . thiamine  100 mg Oral Daily  . warfarin  2.5 mg Oral ONCE-1600  . Warfarin - Pharmacist Dosing Inpatient   Does not apply q1600    Infusions: . sodium chloride Stopped (06/27/19 0611)  . sodium chloride    . sodium chloride Stopped (06/18/19 0953)  . sodium chloride Stopped (06/19/19 1446)  . sodium chloride    . sodium chloride    . sodium chloride    . sodium chloride    . sodium chloride 10 mL/hr at 06/26/19 2300  . amiodarone 30 mg/hr (06/27/19 0700)  . cefUROXime (ZINACEF)  IV 1.5 g (06/27/19 0801)  . dexmedetomidine (PRECEDEX) IV infusion Stopped (06/26/19 0442)  . epinephrine Stopped (06/27/19 0006)  . fentaNYL infusion INTRAVENOUS Stopped (06/26/19 0502)  . furosemide (LASIX) infusion Stopped (06/27/19 0221)  . heparin    .  insulin 1.6 Units/hr (06/27/19 0758)  . lactated ringers    . lactated ringers    . lactated ringers 20 mL/hr at 06/27/19 0700  . milrinone 0.125 mcg/kg/min (06/27/19 0700)  . norepinephrine (LEVOPHED) Adult infusion 8 mcg/min (06/27/19 0700)  . vasopressin (PITRESSIN) infusion - *FOR SHOCK*  Stopped (06/26/19 1823)    PRN Medications: sodium chloride, sodium chloride, sodium chloride, sodium chloride, acetaminophen, ALPRAZolam, dextrose, fentaNYL (SUBLIMAZE) injection, lactated ringers, morphine injection, ondansetron (ZOFRAN) IV, oxyCODONE, [START ON 07/02/2019] pneumococcal 23 valent vaccine, sodium chloride flush, traMADol, traZODone    Patient Profile   Joni Norrod Difatta --post MI cardiogenic shock/ optimization prior to anticipated CABG, at the request of Dr. Marlou Porch, Cardiology.    Assessment/Plan   1. S/P HMIII LVAD implant 06/25/19 - Extubated 6/25  - Co-ox and CI stable, remains on milrinone 0.125 and NE 8.  Wean NE today as MAP tolerates.  - Restart Lasix gtt at 4 mg/hr with CVP 10.  Replace K.  - Start low dose heparin gtt today + warfarin.     2. Acute Anterolateral STEMI/ Multivessel CAD - Emergent cath showed severe multivessel disease w/ 75% mid- distal LM, 100% prox LAD, not amendable to PCI (unable to cross w/ guide wire, probable CTO), 100% prox LCx (probable CTO) and 100% ostial RCA (probable CTO). There were mild to moderate collaterals right to left to the LAD, minimal right to right collaterals an minimal left to left collaterals. LVEF 25% - Hs trop peaked to 17,602>27,000. - No targets for CABG / PCI.   - S/P HMIII VAD  3.  Acute Systolic Heart Failure --->Cardiogenic Shock - 2/2 acute MI. LVEF by LVG 25% - IABP out 6/18 but replaced 6/23. Removed in OR on 6/24  - HMIII LVAD  Placed 6/24 - Extubated 6/25   4. NSVT - improved - keep K> 4.0 Mg >2.0  - continue amio gtt while on milrinone/NE.  - Replace electrolytes.   5  Hypertension  - reported h/o untreated HTN prior to admit  6. Tobacco Abuse - 30 pack years  - smoking cessation advised  - Will need to quit if to be considered for heart transplant in the future   7. Elevated LFTs due to shock liver - improved  8 ETOH  - Drinks 2 mixed drinks a night + more on the weekend.    9.  Renal mass - seen on CT - Concern for RCC.  - MRI abdomen- 1.8 cm right renal lesion, most consistent with renal cell carcinoma. No evidence of renal vein involvement or nodal metastasis. - Discussed w/ Urology and R RCC is small and will just require surveillance.  ok to proceed with VAD from their standpoint.   10  Thrombocytopenia - PLTs on admit 172  - 153 today.   11. Hyponatremia  - Sodium 132  12. Sleep Apnea - frequent apnea at night  - needs formal sleep study   - order CPAP while inpatient    CRITICAL CARE Performed by: Loralie Champagne  Total critical care time: 35 minutes  Critical care time was exclusive of separately billable procedures and treating other patients.  Critical care was necessary to treat or prevent imminent or life-threatening deterioration.  Critical care was time spent personally by me on the following activities: development of treatment plan with patient and/or surrogate as well as nursing, discussions with consultants, evaluation of patient's response to treatment, examination of patient, obtaining history from patient or surrogate, ordering and performing treatments  and interventions, ordering and review of laboratory studies, ordering and review of radiographic studies, pulse oximetry and re-evaluation of patient's condition.  Loralie Champagne 06/27/2019 8:30 AM

## 2019-06-27 NOTE — Progress Notes (Signed)
2 Days Post-Op Procedure(s) (LRB): INSERTION OF IMPLANTABLE LEFT VENTRICULAR ASSIST DEVICE USING THORATEC HEARTMATE 3 (N/A) TRANSESOPHAGEAL ECHOCARDIOGRAM (TEE) (N/A) Subjective: No complaints  Objective: Vital signs in last 24 hours: Temp:  [96.4 F (35.8 C)-102.2 F (39 C)] 97.9 F (36.6 C) (06/26 0700) Pulse Rate:  [27-156] 86 (06/26 0700) Cardiac Rhythm: Normal sinus rhythm (06/26 0400) Resp:  [9-33] 21 (06/26 0700) BP: (59-116)/(32-100) 63/48 (06/26 0500) SpO2:  [48 %-100 %] 93 % (06/26 0700) Arterial Line BP: (65-116)/(50-91) 72/62 (06/26 0700) Weight:  [100 kg] 100 kg (06/26 0500)  Hemodynamic parameters for last 24 hours: PAP: (12-41)/(4-26) 19/10 CVP:  [0 mmHg-31 mmHg] 13 mmHg CO:  [4.2 L/min-5.4 L/min] 4.4 L/min CI:  [2 L/min/m2-2.6 L/min/m2] 2.1 L/min/m2  Intake/Output from previous day: 06/25 0701 - 06/26 0700 In: 3131 [P.O.:290; I.V.:2166.3; IV Piggyback:674.7] Out: 1624 [Urine:3075; Chest Tube:400] Intake/Output this shift: No intake/output data recorded.  General appearance: alert and cooperative Neurologic: intact Heart: regular rate and rhythm, S1, S2 normal, no murmur, click, rub or gallop Lungs: diminished breath sounds bilaterally Abdomen: soft, non-tender; bowel sounds normal; no masses,  no organomegaly Extremities: edema 2+ Wound: c/d/i  Lab Results: Recent Labs    06/26/19 1735 06/26/19 1735 06/27/19 0437 06/27/19 0500  WBC 12.8*  --   --  9.4  HGB 10.6*   < > 9.9* 9.7*  HCT 32.0*   < > 29.0* 29.6*  PLT 186  --   --  153   < > = values in this interval not displayed.   BMET:  Recent Labs    06/26/19 1735 06/26/19 1735 06/27/19 0437 06/27/19 0500  NA 131*   < > 134* 132*  K 4.0   < > 3.2* 3.2*  CL 98  --   --  98  CO2 22  --   --  23  GLUCOSE 161*  --   --  117*  BUN 15  --   --  16  CREATININE 1.23  --   --  1.27*  CALCIUM 8.1*  --   --  8.1*   < > = values in this interval not displayed.    PT/INR:  Recent Labs     06/27/19 0500  LABPROT 15.3*  INR 1.3*   ABG    Component Value Date/Time   PHART 7.475 (H) 06/27/2019 0437   HCO3 25.8 06/27/2019 0437   TCO2 27 06/27/2019 0437   ACIDBASEDEF 1.0 06/26/2019 0838   O2SAT 70.1 06/27/2019 0450   CBG (last 3)  Recent Labs    06/27/19 0117 06/27/19 0237 06/27/19 0435  GLUCAP 142* 119* 90    Assessment/Plan: S/P Procedure(s) (LRB): INSERTION OF IMPLANTABLE LEFT VENTRICULAR ASSIST DEVICE USING THORATEC HEARTMATE 3 (N/A) TRANSESOPHAGEAL ECHOCARDIOGRAM (TEE) (N/A) Mobilize d/c PA cath  Begin warfarin; start hep at 500 u/hr no titration Consider removing mediastinal tubes later today but keep for now   LOS: 10 days    Aaron [name] 06/27/2019

## 2019-06-28 ENCOUNTER — Inpatient Hospital Stay (HOSPITAL_COMMUNITY): Payer: BC Managed Care – PPO

## 2019-06-28 LAB — LACTATE DEHYDROGENASE: LDH: 334 U/L — ABNORMAL HIGH (ref 98–192)

## 2019-06-28 LAB — CBC WITH DIFFERENTIAL/PLATELET
Abs Immature Granulocytes: 0.03 10*3/uL (ref 0.00–0.07)
Basophils Absolute: 0 10*3/uL (ref 0.0–0.1)
Basophils Relative: 0 %
Eosinophils Absolute: 0.1 10*3/uL (ref 0.0–0.5)
Eosinophils Relative: 2 %
HCT: 29.7 % — ABNORMAL LOW (ref 39.0–52.0)
Hemoglobin: 9.8 g/dL — ABNORMAL LOW (ref 13.0–17.0)
Immature Granulocytes: 0 %
Lymphocytes Relative: 16 %
Lymphs Abs: 1.2 10*3/uL (ref 0.7–4.0)
MCH: 32.7 pg (ref 26.0–34.0)
MCHC: 33 g/dL (ref 30.0–36.0)
MCV: 99 fL (ref 80.0–100.0)
Monocytes Absolute: 0.8 10*3/uL (ref 0.1–1.0)
Monocytes Relative: 11 %
Neutro Abs: 5.2 10*3/uL (ref 1.7–7.7)
Neutrophils Relative %: 71 %
Platelets: 149 10*3/uL — ABNORMAL LOW (ref 150–400)
RBC: 3 MIL/uL — ABNORMAL LOW (ref 4.22–5.81)
RDW: 13.1 % (ref 11.5–15.5)
WBC: 7.4 10*3/uL (ref 4.0–10.5)
nRBC: 0 % (ref 0.0–0.2)

## 2019-06-28 LAB — COOXEMETRY PANEL
Carboxyhemoglobin: 1.1 % (ref 0.5–1.5)
Carboxyhemoglobin: 0.8 % (ref 0.5–1.5)
Carboxyhemoglobin: 1 % (ref 0.5–1.5)
Methemoglobin: 1.1 % (ref 0.0–1.5)
Methemoglobin: 0.7 % (ref 0.0–1.5)
Methemoglobin: 0.6 % (ref 0.0–1.5)
O2 Saturation: 43.7 %
O2 Saturation: 44 %
O2 Saturation: 50.5 %
Total hemoglobin: 10.3 g/dL — ABNORMAL LOW (ref 12.0–16.0)
Total hemoglobin: 13 g/dL (ref 12.0–16.0)
Total hemoglobin: 9.4 g/dL — ABNORMAL LOW (ref 12.0–16.0)

## 2019-06-28 LAB — PHOSPHORUS: Phosphorus: 2.9 mg/dL (ref 2.5–4.6)

## 2019-06-28 LAB — PROTIME-INR
INR: 1.1 (ref 0.8–1.2)
Prothrombin Time: 13.9 seconds (ref 11.4–15.2)

## 2019-06-28 LAB — HEPARIN LEVEL (UNFRACTIONATED)
Heparin Unfractionated: <0.1 IU/mL — ABNORMAL LOW (ref 0.30–0.70)
Heparin Unfractionated: <0.1 IU/mL — ABNORMAL LOW (ref 0.30–0.70)
Heparin Unfractionated: <0.1 IU/mL — ABNORMAL LOW (ref 0.30–0.70)

## 2019-06-28 LAB — COMPREHENSIVE METABOLIC PANEL
ALT: 146 U/L — ABNORMAL HIGH (ref 0–44)
AST: 90 U/L — ABNORMAL HIGH (ref 15–41)
Albumin: 2.8 g/dL — ABNORMAL LOW (ref 3.5–5.0)
Alkaline Phosphatase: 53 U/L (ref 38–126)
Anion gap: 9 (ref 5–15)
BUN: 11 mg/dL (ref 6–20)
CO2: 28 mmol/L (ref 22–32)
Calcium: 7.8 mg/dL — ABNORMAL LOW (ref 8.9–10.3)
Chloride: 95 mmol/L — ABNORMAL LOW (ref 98–111)
Creatinine, Ser: 1.13 mg/dL (ref 0.61–1.24)
GFR calc Af Amer: >60 mL/min (ref >60)
GFR calc non Af Amer: >60 mL/min (ref >60)
Glucose, Bld: 130 mg/dL — ABNORMAL HIGH (ref 70–99)
Potassium: 3.4 mmol/L — ABNORMAL LOW (ref 3.5–5.1)
Sodium: 132 mmol/L — ABNORMAL LOW (ref 135–145)
Total Bilirubin: 0.7 mg/dL (ref 0.3–1.2)
Total Protein: 5.5 g/dL — ABNORMAL LOW (ref 6.5–8.1)

## 2019-06-28 LAB — MAGNESIUM: Magnesium: 2.1 mg/dL (ref 1.7–2.4)

## 2019-06-28 LAB — GLUCOSE, CAPILLARY
Glucose-Capillary: 90 mg/dL (ref 70–99)
Glucose-Capillary: 124 mg/dL — ABNORMAL HIGH (ref 70–99)
Glucose-Capillary: 112 mg/dL — ABNORMAL HIGH (ref 70–99)
Glucose-Capillary: 114 mg/dL — ABNORMAL HIGH (ref 70–99)

## 2019-06-28 MED ORDER — KETOROLAC TROMETHAMINE 15 MG/ML IJ SOLN
15.0000 mg | Freq: Four times a day (QID) | INTRAMUSCULAR | Status: AC
  Administered 2019-06-28 – 2019-06-30 (×5): 15 mg via INTRAVENOUS
  Filled 2019-06-28 (×5): qty 1

## 2019-06-28 MED ORDER — POTASSIUM CHLORIDE 10 MEQ/50ML IV SOLN
10.0000 meq | INTRAVENOUS | Status: AC
  Administered 2019-06-28 (×3): 10 meq via INTRAVENOUS
  Filled 2019-06-28: qty 50

## 2019-06-28 MED ORDER — POTASSIUM CHLORIDE CRYS ER 20 MEQ PO TBCR
40.0000 meq | EXTENDED_RELEASE_TABLET | Freq: Once | ORAL | Status: AC
  Administered 2019-06-28: 40 meq via ORAL
  Filled 2019-06-28: qty 2

## 2019-06-28 MED ORDER — NITROGLYCERIN 0.4 MG SL SUBL
SUBLINGUAL_TABLET | SUBLINGUAL | Status: AC
  Administered 2019-06-28: 0.4 mg
  Filled 2019-06-28: qty 1

## 2019-06-28 MED ORDER — ORAL CARE MOUTH RINSE
15.0000 mL | Freq: Two times a day (BID) | OROMUCOSAL | Status: DC
  Administered 2019-06-29 – 2019-07-09 (×11): 15 mL via OROMUCOSAL

## 2019-06-28 MED ORDER — AMIODARONE HCL 200 MG PO TABS
200.0000 mg | ORAL_TABLET | Freq: Two times a day (BID) | ORAL | Status: DC
  Administered 2019-06-28 – 2019-07-01 (×8): 200 mg via ORAL
  Filled 2019-06-28 (×8): qty 1

## 2019-06-28 MED ORDER — NITROGLYCERIN 0.4 MG SL SUBL
0.4000 mg | SUBLINGUAL_TABLET | SUBLINGUAL | Status: DC | PRN
  Administered 2019-06-28 (×2): 0.4 mg via SUBLINGUAL

## 2019-06-28 MED ORDER — WARFARIN SODIUM 2.5 MG PO TABS
2.5000 mg | ORAL_TABLET | Freq: Once | ORAL | Status: AC
  Administered 2019-06-28: 2.5 mg via ORAL
  Filled 2019-06-28: qty 1

## 2019-06-28 MED ORDER — DIAZEPAM 2 MG PO TABS
2.0000 mg | ORAL_TABLET | Freq: Three times a day (TID) | ORAL | Status: DC
  Administered 2019-06-29 – 2019-06-30 (×5): 2 mg via ORAL
  Filled 2019-06-28 (×5): qty 1

## 2019-06-28 MED ORDER — FENTANYL CITRATE (PF) 100 MCG/2ML IJ SOLN
50.0000 ug | INTRAMUSCULAR | Status: DC | PRN
  Administered 2019-06-28 – 2019-07-23 (×17): 50 ug via INTRAVENOUS
  Filled 2019-06-28 (×20): qty 2

## 2019-06-28 MED ORDER — CALCIUM CARBONATE ANTACID 500 MG PO CHEW
200.0000 mg | CHEWABLE_TABLET | Freq: Three times a day (TID) | ORAL | Status: DC | PRN
  Administered 2019-06-28 – 2019-07-18 (×10): 200 mg via ORAL
  Filled 2019-06-28 (×10): qty 1

## 2019-06-28 NOTE — Progress Notes (Signed)
Pt has declined use of cpap at this time stating it is uncomfortable to wear and he needs a sleep study.  RT removed device from room at this time as well so pt isn't charged for equipment not being in use. RT will continue to monitor.

## 2019-06-28 NOTE — Progress Notes (Addendum)
Patient ID: Aaron [name], male   DOB: May 21, 1965, 54 y.o.   MRN: [id number]     Advanced Heart Failure Rounding Note  PCP-Cardiologist: No primary care provider on file.   Subjective:    HM3 placement 6/24  He is on milrinone 0.125. Off NE   CVP 8-9.   Still sore but walking unit. Says he feels better every day. Denies SOB. On milrinone 0.125. Off NE. Co-ox 44%   LVAD Interrogation HM III: Speed: 5200 Flow: 4.2 PI: 3.4 Power: 4.0.  No PI events.   Objective:   Weight Range: 101 kg Body mass index is 32.88 kg/m.   Vital Signs:   Temp:  [97.3 F (36.3 C)-98 F (36.7 C)] 97.3 F (36.3 C) (06/27 1120) Pulse Rate:  [31-94] 64 (06/27 1300) Resp:  [11-23] 15 (06/27 1300) BP: (75-109)/(36-90) 80/65 (06/27 1300) SpO2:  [84 %-100 %] 93 % (06/27 1300) Weight:  [101 kg] 101 kg (06/27 0500) Last BM Date: 06/28/19  Weight change: Filed Weights   06/26/19 0500 06/27/19 0500 06/28/19 0500  Weight: 102.4 kg 100 kg 101 kg    Intake/Output:   Intake/Output Summary (Last 24 hours) at 06/28/2019 1419 Last data filed at 06/28/2019 1300 Gross per 24 hour  Intake 1502.18 ml  Output 2885 ml  Net -1382.82 ml      Physical Exam   General:  NAD. Lying in bed.  HEENT: normal  Neck: supple. JVP 9-10  Carotids 2+ bilat; no bruits. No lymphadenopathy or thryomegaly appreciated. Cor: Sternal wound ok. LVAD hum.  CT in place Lungs: Clear. Abdomen: soft, nontender, non-distended. No hepatosplenomegaly. No bruits or masses. Good bowel sounds. Driveline site clean. Anchor in place.  Extremities: no cyanosis, clubbing, rash. Warm trace edema  Neuro: alert & oriented x 3. No focal deficits. Moves all 4 without problem     Telemetry   NSR 60 with PVCs Personally reviewed   Labs    CBC Recent Labs    06/27/19 0500 06/28/19 0400  WBC 9.4 7.4  NEUTROABS 7.0 5.2  HGB 9.7* 9.8*  HCT 29.6* 29.7*  MCV 100.0 99.0  PLT 153 425*   Basic Metabolic Panel Recent Labs     06/27/19 0500 06/27/19 0500 06/27/19 1758 06/28/19 0400  NA 132*   < > 132* 132*  K 3.2*   < > 3.5 3.4*  CL 98   < > 97* 95*  CO2 23   < > 26 28  GLUCOSE 117*   < > 156* 130*  BUN 16   < > 17 11  CREATININE 1.27*   < > 1.07 1.13  CALCIUM 8.1*   < > 7.7* 7.8*  MG 2.2  --   --  2.1  PHOS 4.0  --   --  2.9   < > = values in this interval not displayed.   Liver Function Tests Recent Labs    06/27/19 0500 06/28/19 0400  AST 165* 90*  ALT 180* 146*  ALKPHOS 45 53  BILITOT 1.4* 0.7  PROT 5.4* 5.5*  ALBUMIN 3.0* 2.8*   No results for input(s): LIPASE, AMYLASE in the last 72 hours. Cardiac Enzymes No results for input(s): CKTOTAL, CKMB, CKMBINDEX, TROPONINI in the last 72 hours.  BNP: BNP (last 3 results) Recent Labs    06/17/19 2023 06/18/19 0119 06/26/19 0445  BNP 751.1* 1,068.5* 1,301.1*    ProBNP (last 3 results) No results for input(s): PROBNP in the last 8760 hours.   D-Dimer No results  for input(s): DDIMER in the last 72 hours. Hemoglobin A1C No results for input(s): HGBA1C in the last 72 hours. Fasting Lipid Panel No results for input(s): CHOL, HDL, LDLCALC, TRIG, CHOLHDL, LDLDIRECT in the last 72 hours. Thyroid Function Tests No results for input(s): TSH, T4TOTAL, T3FREE, THYROIDAB in the last 72 hours.  Invalid input(s): FREET3  Other results:   Imaging    DG Chest Port 1 View  Result Date: 06/28/2019 CLINICAL DATA:  Follow-up for pleural effusions/pneumothorax. Left ventricular assist device. EXAM: PORTABLE CHEST 1 VIEW COMPARISON:  06/27/2019 and older studies. FINDINGS: Changes from previous cardiac surgery and the placement of a left ventricular assist device are stable from the previous exam. No mediastinal widening. Swan-Ganz catheter has been removed. The introducer sheath remains in place. Lungs demonstrate mild, stable bilateral interstitial prominence. No overt pulmonary edema. No convincing pneumonia. No pneumothorax. IMPRESSION: 1. No  acute abnormalities or evidence of an operative complication. 2. Removal of the right Swan-Ganz catheter since the previous day's study with the introducer sheath remaining in place. No other change. Electronically Signed   By: Lajean Manes M.D.   On: 06/28/2019 08:38     Medications:     Scheduled Medications: . acetaminophen  1,000 mg Oral Q6H   Or  . acetaminophen (TYLENOL) oral liquid 160 mg/5 mL  1,000 mg Per Tube Q6H  . aspirin EC  325 mg Oral Daily   Or  . aspirin  324 mg Per Tube Daily  . atorvastatin  80 mg Oral Daily  . bisacodyl  10 mg Oral Daily   Or  . bisacodyl  10 mg Rectal Daily  . Chlorhexidine Gluconate Cloth  6 each Topical Daily  . clonazePAM  0.5 mg Oral BID  . docusate sodium  200 mg Oral Daily  . folic acid  1 mg Oral Daily  . insulin aspart  0-15 Units Subcutaneous TID WC  . mouth rinse  15 mL Mouth Rinse BID  . mouth rinse  15 mL Mouth Rinse BID  . multivitamin with minerals  1 tablet Oral Daily  . pantoprazole  40 mg Oral Daily  . thiamine  100 mg Oral Daily  . warfarin  2.5 mg Oral ONCE-1600  . Warfarin - Pharmacist Dosing Inpatient   Does not apply q1600    Infusions: . sodium chloride    . amiodarone 30 mg/hr (06/28/19 1300)  . furosemide (LASIX) infusion 8 mg/hr (06/28/19 1300)  . heparin 700 Units/hr (06/28/19 1300)  . lactated ringers    . milrinone 0.125 mcg/kg/min (06/28/19 1328)  . norepinephrine (LEVOPHED) Adult infusion Stopped (06/27/19 1833)    PRN Medications: acetaminophen, ALPRAZolam, bisacodyl, dextrose, fentaNYL (SUBLIMAZE) injection, morphine injection, ondansetron (ZOFRAN) IV, oxyCODONE, [START ON 07/02/2019] pneumococcal 23 valent vaccine, sodium chloride flush, traMADol, traZODone    Patient Profile   Aaron [name] --post MI cardiogenic shock/ optimization prior to anticipated CABG, at the request of Dr. Marlou Porch, Cardiology.    Assessment/Plan    1. Acute Anterolateral STEMI/ Multivessel CAD - Emergent cath  showed severe multivessel disease w/ 75% mid- distal LM, 100% prox LAD, not amendable to PCI (unable to cross w/ guide wire, probable CTO), 100% prox LCx (probable CTO) and 100% ostial RCA (probable CTO). There were mild to moderate collaterals right to left to the LAD, minimal right to right collaterals an minimal left to left collaterals. LVEF 25% - Hs trop peaked 27,000. - No targets for CABG / PCI.   - S/P HMIII VAD  2.  Acute Systolic Heart Failure --->Cardiogenic Shock - 2/2 acute MI. LVEF by LVG 25% - IABP out 6/18 but replaced 6/23. Removed in OR on 6/24  - HMIII LVAD  Placed 6/24 - Extubated 6/25  - remains on milrinone 0.125. NE turned off overnight. Co-ox down to 44%. Will repeat now. May need to increase milrinone - CVP still 9-10. Weight up 13 pounds.  Continue lasix gtt. - On low dose heparin gtt today + warfarin.  Discussed dosing with PharmD personally. No bleeding.  - Ramp echo later this week  3. NSVT - improved - keep K> 4.0 Mg >2.0  - change amio to po - Replace electrolytes.   4  Hypertension  - reported h/o untreated HTN prior to admit  5. Tobacco Abuse - 30 pack years  - smoking cessation advised  - Will need to quit if to be considered for heart transplant in the future   6. Elevated LFTs due to shock liver - improved  7. Renal mass = RCC - MRI abdomen- 1.8 cm right renal lesion, most consistent with renal cell carcinoma. No evidence of renal vein involvement or nodal metastasis. - Discussed w/ Urology and R RCC is small and will just require surveillance.  ok to proceed with VAD from their standpoint.   8. Hyponatremia  - Sodium 132 stable  9. Sleep Apnea - frequent apnea at night  - needs formal sleep study   - order CPAP while inpatient   10. Hypokalemia - supp    Glori Bickers MD 06/28/2019 2:19 PM   Addendum: Repeat co-ox 51%. Will continue milrinone at current level for now.   Glori Bickers, MD  4:45 PM

## 2019-06-28 NOTE — Progress Notes (Signed)
Right after patient walked 346ft he complained of 10/10 chest pain. Pain was treated  as surgical pain with 10mg  of oxycodone. Patient still rated pain as 10/10 and this time with left arm numbness and described pain as exactly the kind of pain associated with his recent heart attack. Patient is diaphoretic and has labored breathing. Nitro sublingual given with no relief in pain. ECG  Is unremarkable. Dr Orvan Seen paged about patient.. Md advise to continue given morphine till pain is resolved.

## 2019-06-28 NOTE — Progress Notes (Signed)
MD came to bedside to assess patient.

## 2019-06-28 NOTE — Progress Notes (Signed)
LVAD dressing changed by this RN. Driveline site is slightly pink, scant yellow crusted exudate at site. Driveline Cleaned using sterile technique. Sterile dressing applied. Sterile technique maintained throughout dressing change. Dressing is occlusive.  Driveline anchor intact. Patient tolerated well with minimal discomfort.   Alma Friendly RN

## 2019-06-28 NOTE — Progress Notes (Signed)
3 Days Post-Op Procedure(s) (LRB): INSERTION OF IMPLANTABLE LEFT VENTRICULAR ASSIST DEVICE USING THORATEC HEARTMATE 3 (N/A) TRANSESOPHAGEAL ECHOCARDIOGRAM (TEE) (N/A) Subjective: No complaints  Objective: Vital signs in last 24 hours: Temp:  [97.3 F (36.3 C)-98 F (36.7 C)] 97.3 F (36.3 C) (06/27 1120) Pulse Rate:  [31-94] 64 (06/27 1300) Cardiac Rhythm: Normal sinus rhythm (06/27 1200) Resp:  [11-23] 15 (06/27 1300) BP: (75-109)/(36-90) 80/65 (06/27 1300) SpO2:  [84 %-100 %] 93 % (06/27 1300) Weight:  [101 kg] 101 kg (06/27 0500)  Hemodynamic parameters for last 24 hours:    Intake/Output from previous day: 06/26 0701 - 06/27 0700 In: 2251.6 [P.O.:1130; I.V.:989.3; IV Piggyback:132.3] Out: 2436 [Urine:1836; Chest Tube:600] Intake/Output this shift: Total I/O In: 448.9 [P.O.:240; I.V.:208.9] Out: 710 [Urine:660; Chest Tube:50]  General appearance: alert and cooperative Neurologic: intact Heart: regular rate and rhythm, S1, S2 normal, no murmur, click, rub or gallop Lungs: clear to auscultation bilaterally Abdomen: soft, non-tender; bowel sounds normal; no masses,  no organomegaly Extremities: extremities normal, atraumatic, no cyanosis or edema Wound: dressed  Lab Results: Recent Labs    06/27/19 0500 06/28/19 0400  WBC 9.4 7.4  HGB 9.7* 9.8*  HCT 29.6* 29.7*  PLT 153 149*   BMET:  Recent Labs    06/27/19 1758 06/28/19 0400  NA 132* 132*  K 3.5 3.4*  CL 97* 95*  CO2 26 28  GLUCOSE 156* 130*  BUN 17 11  CREATININE 1.07 1.13  CALCIUM 7.7* 7.8*    PT/INR:  Recent Labs    06/28/19 0400  LABPROT 13.9  INR 1.1   ABG    Component Value Date/Time   PHART 7.475 (H) 06/27/2019 0437   HCO3 25.8 06/27/2019 0437   TCO2 27 06/27/2019 0437   ACIDBASEDEF 1.0 06/26/2019 0838   O2SAT 44.0 06/28/2019 0753   CBG (last 3)  Recent Labs    06/27/19 2104 06/28/19 0635 06/28/19 1121  GLUCAP 113* 90 124*    Assessment/Plan: S/P Procedure(s)  (LRB): INSERTION OF IMPLANTABLE LEFT VENTRICULAR ASSIST DEVICE USING THORATEC HEARTMATE 3 (N/A) TRANSESOPHAGEAL ECHOCARDIOGRAM (TEE) (N/A) Mobilize Diuresis remove mediastinal tubes  Increase Hep drip rate warfarin   LOS: 11 days    Wonda Olds 06/28/2019

## 2019-06-28 NOTE — Progress Notes (Signed)
[facility] for IV heparin, Coumadin Indication: LVAD  Allergies  Allergen Reactions  . Codeine Hives    Patient Measurements: Height: 5\' 9"  (175.3 cm) Weight: 101 kg (222 lb 10.6 oz) IBW/kg (Calculated) : 70.7 Heparin Dosing Weight: ~ 92 kg  Vital Signs: Temp: 97.7 F (36.5 C) (06/27 0724) Temp Source: Oral (06/27 0724) BP: 87/71 (06/27 0500) Pulse Rate: 64 (06/27 0800)  Labs: Recent Labs    06/25/19 1233 06/25/19 1242 06/26/19 0445 06/26/19 0501 06/26/19 1735 06/26/19 1735 06/27/19 0437 06/27/19 0437 06/27/19 0500 06/27/19 1349 06/27/19 1758 06/28/19 0400  HGB 10.9*   < > 10.2*   < > 10.6*   < > 9.9*   < > 9.7*  --   --  9.8*  HCT 32.1*   < > 30.6*   < > 32.0*   < > 29.0*  --  29.6*  --   --  29.7*  PLT 118*  118*   < > 137*   < > 186  --   --   --  153  --   --  149*  APTT 39*  39*  --   --   --   --   --   --   --   --   --   --   --   LABPROT 16.3*  15.9*   < > 15.4*  --   --   --   --   --  15.3*  --   --  13.9  INR 1.4*  1.3*   < > 1.3*  --   --   --   --   --  1.3*  --   --  1.1  HEPARINUNFRC  --   --   --   --   --   --   --   --   --  <0.10*  --  <0.10*  CREATININE 0.97   < > 0.95   < > 1.23   < >  --   --  1.27*  --  1.07 1.13   < > = values in this interval not displayed.    Estimated Creatinine Clearance: 86.5 mL/min (by C-G formula based on SCr of 1.13 mg/dL).   Medical History: Past Medical History:  Diagnosis Date  . Acute anterolateral wall MI ([place])   . CAD, multiple vessel   . Hypertension   . Ischemic cardiomyopathy   . Systolic heart failure ([place])   . Tobacco abuse     Medications:  Infusions:  . sodium chloride    . amiodarone 30 mg/hr (06/28/19 0700)  . furosemide (LASIX) infusion 8 mg/hr (06/28/19 0700)  . heparin 500 Units/hr (06/28/19 0700)  . lactated ringers    . milrinone 0.125 mcg/kg/min (06/28/19 0700)  . norepinephrine (LEVOPHED) Adult infusion Stopped (06/27/19 1833)  .  potassium chloride 10 mEq (06/28/19 0908)    Assessment: 54 yo male s/p LVAD placement 6/24.  Pharmacy asked to begin anticoagulation with IV heparin and Coumadin today.  No overt bleeding or complications noted.  Hgb low but stable post-op.  Per discussion with Dr. Orvan Seen, will increase heparin to 700 units/hr for today without specific heparin level goal (keep < 0.2).  No overt bleeding or complications noted currently.  Goal of Therapy:  INR 2-2.5  Will not target specific heparin level yet. Monitor platelets by anticoagulation protocol: Yes   Plan:  1. Coumadin 2.5 mg po x 1 again tonight.  2. Continue IV Heparin 700 units/hr IV. 3. Heparin level 6 hrs after rate adjustment. 4. Daily heparin level, CBC and INR.  [place] Crane, Roylene Reason, BCCP Clinical Pharmacist  06/28/2019 9:12 AM   [facility] pharmacy phone numbers are listed on [email or web address]

## 2019-06-28 NOTE — Progress Notes (Signed)
[facility] for IV heparin, Coumadin Indication: LVAD  Allergies  Allergen Reactions  . Codeine Hives    Patient Measurements: Height: 5\' 9"  (175.3 cm) Weight: 101 kg (222 lb 10.6 oz) IBW/kg (Calculated) : 70.7 Heparin Dosing Weight: ~ 92 kg  Vital Signs: Temp: 98.3 F (36.8 C) (06/27 2000) Temp Source: Oral (06/27 2000) BP: 93/74 (06/27 2024) Pulse Rate: 63 (06/27 1800)  Labs: Recent Labs    06/26/19 0445 06/26/19 0501 06/26/19 1735 06/26/19 1735 06/27/19 0437 06/27/19 0437 06/27/19 0500 06/27/19 1349 06/27/19 1758 06/28/19 0400 06/28/19 1328 06/28/19 1937  HGB 10.2*   < > 10.6*   < > 9.9*   < > 9.7*  --   --  9.8*  --   --   HCT 30.6*   < > 32.0*   < > 29.0*  --  29.6*  --   --  29.7*  --   --   PLT 137*   < > 186  --   --   --  153  --   --  149*  --   --   LABPROT 15.4*  --   --   --   --   --  15.3*  --   --  13.9  --   --   INR 1.3*  --   --   --   --   --  1.3*  --   --  1.1  --   --   HEPARINUNFRC  --   --   --   --   --   --   --    < >  --  <0.10* <0.10* <0.10*  CREATININE 0.95   < > 1.23   < >  --   --  1.27*  --  1.07 1.13  --   --    < > = values in this interval not displayed.    Estimated Creatinine Clearance: 86.5 mL/min (by C-G formula based on SCr of 1.13 mg/dL).   Medical History: Past Medical History:  Diagnosis Date  . Acute anterolateral wall MI ([place])   . CAD, multiple vessel   . Hypertension   . Ischemic cardiomyopathy   . Systolic heart failure ([place])   . Tobacco abuse     Medications:  Infusions:  . sodium chloride    . furosemide (LASIX) infusion 8 mg/hr (06/28/19 1900)  . heparin 850 Units/hr (06/28/19 1900)  . lactated ringers    . milrinone 0.125 mcg/kg/min (06/28/19 1900)  . norepinephrine (LEVOPHED) Adult infusion Stopped (06/27/19 1833)    Assessment: 54 yo male s/p LVAD placement 6/24.  Pharmacy asked to begin anticoagulation with IV heparin and Coumadin today.  No overt bleeding  or complications noted.  Hgb low but stable post-op.  Per discussion with Dr. Orvan Seen, will increase heparin to 850 units/hr and will target heparin level ~ 0.2.  No overt bleeding or complications noted currently.  OP HL remains < 0.1 will increase heparin drip   Goal of Therapy:  INR 2-2.5  Will not target specific heparin level yet. Monitor platelets by anticoagulation protocol: Yes   Plan:   Increase IV heparin 950 units/hr Daily heparin level, CBC and INR.  Bonnita Nasuti Pharm.D. CPP, BCPS Clinical Pharmacist [phone] 06/28/2019 9:56 PM      [facility] pharmacy phone numbers are listed on [email or web address]

## 2019-06-28 NOTE — Progress Notes (Signed)
[facility] for IV heparin, Coumadin Indication: LVAD  Allergies  Allergen Reactions  . Codeine Hives    Patient Measurements: Height: 5\' 9"  (175.3 cm) Weight: 101 kg (222 lb 10.6 oz) IBW/kg (Calculated) : 70.7 Heparin Dosing Weight: ~ 92 kg  Vital Signs: Temp: 97.3 F (36.3 C) (06/27 1120) Temp Source: Oral (06/27 1120) BP: 80/65 (06/27 1300) Pulse Rate: 64 (06/27 1300)  Labs: Recent Labs    06/26/19 0445 06/26/19 0501 06/26/19 1735 06/26/19 1735 06/27/19 0437 06/27/19 0437 06/27/19 0500 06/27/19 1349 06/27/19 1758 06/28/19 0400 06/28/19 1328  HGB 10.2*   < > 10.6*   < > 9.9*   < > 9.7*  --   --  9.8*  --   HCT 30.6*   < > 32.0*   < > 29.0*  --  29.6*  --   --  29.7*  --   PLT 137*   < > 186  --   --   --  153  --   --  149*  --   LABPROT 15.4*  --   --   --   --   --  15.3*  --   --  13.9  --   INR 1.3*  --   --   --   --   --  1.3*  --   --  1.1  --   HEPARINUNFRC  --   --   --   --   --   --   --  <0.10*  --  <0.10* <0.10*  CREATININE 0.95   < > 1.23   < >  --   --  1.27*  --  1.07 1.13  --    < > = values in this interval not displayed.    Estimated Creatinine Clearance: 86.5 mL/min (by C-G formula based on SCr of 1.13 mg/dL).   Medical History: Past Medical History:  Diagnosis Date  . Acute anterolateral wall MI ([place])   . CAD, multiple vessel   . Hypertension   . Ischemic cardiomyopathy   . Systolic heart failure ([place])   . Tobacco abuse     Medications:  Infusions:  . sodium chloride    . amiodarone 30 mg/hr (06/28/19 1300)  . furosemide (LASIX) infusion 8 mg/hr (06/28/19 1300)  . heparin 700 Units/hr (06/28/19 1300)  . lactated ringers    . milrinone 0.125 mcg/kg/min (06/28/19 1328)  . norepinephrine (LEVOPHED) Adult infusion Stopped (06/27/19 1833)    Assessment: 54 yo male s/p LVAD placement 6/24.  Pharmacy asked to begin anticoagulation with IV heparin and Coumadin today.  No overt bleeding or  complications noted.  Hgb low but stable post-op.  Per discussion with Dr. Orvan Seen, will increase heparin to 850 units/hr and will target heparin level ~ 0.2.  No overt bleeding or complications noted currently.  Goal of Therapy:  INR 2-2.5  Will not target specific heparin level yet. Monitor platelets by anticoagulation protocol: Yes   Plan:  1. Coumadin 2.5 mg po x 1 again tonight. 2. Increase IV heparin 850 units/hr 3. Heparin level 6 hrs after rate adjustment. 4. Daily heparin level, CBC and INR.  [place] Crane, Roylene Reason, BCCP Clinical Pharmacist  06/28/2019 2:40 PM   [facility] pharmacy phone numbers are listed on [email or web address]

## 2019-06-28 NOTE — Progress Notes (Signed)
Pt declined use of CPAP tonight. Pt states it is very uncomfortable to wear and prefers not to wear. Pt respiratory status is stable on [place] 3Lpm. RT will continue to monitor.

## 2019-06-29 ENCOUNTER — Inpatient Hospital Stay: Payer: Self-pay

## 2019-06-29 ENCOUNTER — Inpatient Hospital Stay (HOSPITAL_COMMUNITY): Payer: BC Managed Care – PPO

## 2019-06-29 DIAGNOSIS — R079 Chest pain, unspecified: Secondary | ICD-10-CM

## 2019-06-29 DIAGNOSIS — Z95811 Presence of heart assist device: Secondary | ICD-10-CM

## 2019-06-29 LAB — BPAM RBC
Blood Product Expiration Date: 202107132359
Blood Product Expiration Date: 202107142359
Blood Product Expiration Date: 202107152359
Blood Product Expiration Date: 202107152359
Blood Product Expiration Date: 202107152359
Blood Product Expiration Date: 202107172359
Blood Product Expiration Date: 202107222359
Blood Product Expiration Date: 202107232359
ISSUE DATE / TIME: 202106240120
ISSUE DATE / TIME: 202106240740
ISSUE DATE / TIME: 202106240740
ISSUE DATE / TIME: 202106271250
ISSUE DATE / TIME: 202106271526
ISSUE DATE / TIME: 202106271609
ISSUE DATE / TIME: 202106272349
ISSUE DATE / TIME: 202106281222
Unit Type and Rh: 5100
Unit Type and Rh: 5100
Unit Type and Rh: 5100
Unit Type and Rh: 5100
Unit Type and Rh: 9500
Unit Type and Rh: 9500
Unit Type and Rh: 9500
Unit Type and Rh: 9500

## 2019-06-29 LAB — CBC WITH DIFFERENTIAL/PLATELET
Abs Immature Granulocytes: 0.05 10*3/uL (ref 0.00–0.07)
Basophils Absolute: 0 10*3/uL (ref 0.0–0.1)
Basophils Relative: 0 %
Eosinophils Absolute: 0.1 10*3/uL (ref 0.0–0.5)
Eosinophils Relative: 1 %
HCT: 29.6 % — ABNORMAL LOW (ref 39.0–52.0)
Hemoglobin: 9.7 g/dL — ABNORMAL LOW (ref 13.0–17.0)
Immature Granulocytes: 1 %
Lymphocytes Relative: 14 %
Lymphs Abs: 1.3 10*3/uL (ref 0.7–4.0)
MCH: 32.6 pg (ref 26.0–34.0)
MCHC: 32.8 g/dL (ref 30.0–36.0)
MCV: 99.3 fL (ref 80.0–100.0)
Monocytes Absolute: 0.9 10*3/uL (ref 0.1–1.0)
Monocytes Relative: 10 %
Neutro Abs: 7 10*3/uL (ref 1.7–7.7)
Neutrophils Relative %: 74 %
Platelets: 203 10*3/uL (ref 150–400)
RBC: 2.98 MIL/uL — ABNORMAL LOW (ref 4.22–5.81)
RDW: 13.2 % (ref 11.5–15.5)
WBC: 9.4 10*3/uL (ref 4.0–10.5)
nRBC: 0 % (ref 0.0–0.2)

## 2019-06-29 LAB — BASIC METABOLIC PANEL
Anion gap: 10 (ref 5–15)
BUN: 13 mg/dL (ref 6–20)
CO2: 27 mmol/L (ref 22–32)
Calcium: 8.3 mg/dL — ABNORMAL LOW (ref 8.9–10.3)
Chloride: 95 mmol/L — ABNORMAL LOW (ref 98–111)
Creatinine, Ser: 1.07 mg/dL (ref 0.61–1.24)
GFR calc Af Amer: >60 mL/min (ref >60)
GFR calc non Af Amer: >60 mL/min (ref >60)
Glucose, Bld: 99 mg/dL (ref 70–99)
Potassium: 4 mmol/L (ref 3.5–5.1)
Sodium: 132 mmol/L — ABNORMAL LOW (ref 135–145)

## 2019-06-29 LAB — TYPE AND SCREEN
ABO/RH(D): O NEG
Antibody Screen: NEGATIVE
Unit division: 0
Unit division: 0
Unit division: 0
Unit division: 0
Unit division: 0
Unit division: 0
Unit division: 0
Unit division: 0

## 2019-06-29 LAB — ECHOCARDIOGRAM LIMITED
Height: 69 in
Weight: 3562.63 oz

## 2019-06-29 LAB — GLUCOSE, CAPILLARY
Glucose-Capillary: 100 mg/dL — ABNORMAL HIGH (ref 70–99)
Glucose-Capillary: 122 mg/dL — ABNORMAL HIGH (ref 70–99)
Glucose-Capillary: 132 mg/dL — ABNORMAL HIGH (ref 70–99)
Glucose-Capillary: 117 mg/dL — ABNORMAL HIGH (ref 70–99)

## 2019-06-29 LAB — PHOSPHORUS: Phosphorus: 3.4 mg/dL (ref 2.5–4.6)

## 2019-06-29 LAB — HEPARIN LEVEL (UNFRACTIONATED)
Heparin Unfractionated: 0.11 IU/mL — ABNORMAL LOW (ref 0.30–0.70)
Heparin Unfractionated: 0.16 IU/mL — ABNORMAL LOW (ref 0.30–0.70)

## 2019-06-29 LAB — LACTATE DEHYDROGENASE: LDH: 347 U/L — ABNORMAL HIGH (ref 98–192)

## 2019-06-29 LAB — COOXEMETRY PANEL
Carboxyhemoglobin: 1.2 % (ref 0.5–1.5)
Methemoglobin: 1.2 % (ref 0.0–1.5)
O2 Saturation: 53.8 %
Total hemoglobin: 10.3 g/dL — ABNORMAL LOW (ref 12.0–16.0)

## 2019-06-29 LAB — MAGNESIUM: Magnesium: 2.2 mg/dL (ref 1.7–2.4)

## 2019-06-29 LAB — PROTIME-INR
INR: 1.1 (ref 0.8–1.2)
Prothrombin Time: 14 seconds (ref 11.4–15.2)

## 2019-06-29 MED ORDER — WARFARIN SODIUM 3 MG PO TABS
3.0000 mg | ORAL_TABLET | Freq: Once | ORAL | Status: AC
  Administered 2019-06-29: 3 mg via ORAL
  Filled 2019-06-29: qty 1

## 2019-06-29 MED ORDER — SODIUM CHLORIDE 0.9% FLUSH
10.0000 mL | Freq: Two times a day (BID) | INTRAVENOUS | Status: DC
  Administered 2019-06-29 – 2019-07-02 (×5): 10 mL
  Administered 2019-07-02: 40 mL
  Administered 2019-07-03 – 2019-07-08 (×10): 10 mL
  Administered 2019-07-08: 20 mL
  Administered 2019-07-09 – 2019-07-12 (×5): 10 mL
  Administered 2019-07-13: 20 mL
  Administered 2019-07-14 – 2019-07-20 (×12): 10 mL
  Administered 2019-07-20: 20 mL
  Administered 2019-07-21 – 2019-07-23 (×5): 10 mL
  Administered 2019-07-24: 20 mL

## 2019-06-29 MED ORDER — SODIUM CHLORIDE 0.9% FLUSH: 10.0000 mL | INTRAVENOUS | Status: DC | PRN

## 2019-06-29 MED ORDER — GABAPENTIN 300 MG PO CAPS
300.0000 mg | ORAL_CAPSULE | Freq: Every day | ORAL | Status: DC
  Administered 2019-06-29 – 2019-06-30 (×2): 300 mg via ORAL
  Filled 2019-06-29 (×2): qty 1

## 2019-06-29 NOTE — Progress Notes (Signed)
Physical Therapy Treatment Patient Details Name: Aaron [name] MRN: [id number] DOB: 1965-04-26 Today's Date: 06/29/2019    History of Present Illness Pt is 54yo admitted 6/16 with STEMI with multivessel disease. cath with no viable targets for CABG. Pt s/p HeartMate 3 LVAD implantation 6/24. PMHx: HTN, tobacco use (30pack yrs)    PT Comments    Pt pleasant and reports feeling better after sleeping this am and pain decreased. Pt able to state not to push or lift and educated for all precautions. Pt able to transition power to and from battery appropriately but unaware of what anchor is as it was not in place on arrival and replaced prior to mobility. Pt also needs cues and assist to untangle cords and holster as pt just placing batteries in holster then connecting without awareness of lines twisting with attempting to don.   Pt able to walk in hall on RA and educated for progression.   HR 71-78 Speed 5200, flow 5.2, PI 3.9-5.5, power 3.6 Pre 114/100 (106) automatic cuff Post 64/49 (56) with automatic cuff but when dopplered got 85 100% on RA with drop to 88% at rest and 3L reapplied as RN reports desaturation with sleeping    Follow Up Recommendations  Home health PT;Supervision/Assistance - 24 hour     Equipment Recommendations  Other (comment) (rollator)    Recommendations for Other Services       Precautions / Restrictions Precautions Precautions: Fall;Other (comment);Sternal Precaution Comments: LVAD, chest tubes x2, sternal precautions Restrictions Weight Bearing Restrictions: Yes (Sternal precautions)    Mobility  Bed Mobility Overal bed mobility: Needs Assistance Bed Mobility: Supine to Sit;Sit to Supine     Supine to sit: Min guard Sit to supine: Min guard   General bed mobility comments: guarding for lines and precautions without physical assist  Transfers Overall transfer level: Needs assistance   Transfers: Sit to/from Stand Sit to Stand: Min guard          General transfer comment: cues for hand placement and safety  Ambulation/Gait Ambulation/Gait assistance: Min guard Gait Distance (Feet): 300 Feet Assistive device: Rolling walker (2 wheeled) Gait Pattern/deviations: Step-through pattern;Decreased stride length;Trunk flexed   Gait velocity interpretation: 1.31 - 2.62 ft/sec, indicative of limited community ambulator General Gait Details: 3 standing rest breaks during gait with cues for posture and RW use as well as direction to room   Stairs             Wheelchair Mobility    Modified Rankin (Stroke Patients Only)       Balance Overall balance assessment: Mild deficits observed, not formally tested                                          Cognition Arousal/Alertness: Awake/alert Behavior During Therapy: Flat affect Overall Cognitive Status: Within Functional Limits for tasks assessed                                        Exercises      General Comments        Pertinent Vitals/Pain Pain Assessment: 0-10 Pain Score: 5  Pain Location: chest pain after gait Pain Descriptors / Indicators: Sore;Guarding;Aching Pain Intervention(s): Limited activity within patient's tolerance;Monitored during session;Repositioned    Home Living  Prior Function            PT Goals (current goals can now be found in the care plan section) Progress towards PT goals: Progressing toward goals    Frequency    Min 4X/week      PT Plan Current plan remains appropriate;Frequency needs to be updated    Co-evaluation              AM-PAC PT "6 Clicks" Mobility   Outcome Measure  Help needed turning from your back to your side while in a flat bed without using bedrails?: A Little Help needed moving from lying on your back to sitting on the side of a flat bed without using bedrails?: A Little Help needed moving to and from a bed to a chair  (including a wheelchair)?: A Little Help needed standing up from a chair using your arms (e.g., wheelchair or bedside chair)?: A Little Help needed to walk in hospital room?: A Little Help needed climbing 3-5 steps with a railing? : A Lot 6 Click Score: 17    End of Session   Activity Tolerance: Patient tolerated treatment well Patient left: in bed;with call bell/phone within reach (return to bed for PICC) Nurse Communication: Mobility status PT Visit Diagnosis: Other abnormalities of gait and mobility (R26.89)     Time: 0350-0938 PT Time Calculation (min) (ACUTE ONLY): 45 min  Charges:  $Gait Training: 8-22 mins $Therapeutic Activity: 23-37 mins                     Ashira Kirsten P, PT Acute Rehabilitation Services Pager: [phone] Office: [phone]    Sandy Salaam Danelia Snodgrass 06/29/2019, 12:54 PM

## 2019-06-29 NOTE — Progress Notes (Addendum)
LVAD Coordinator Rounding Note:  Transferred from [facility] [name] on 06/17/19 with STEMI. Cath revealed multi-vessel CAD, wire would not cross to open LAD; IABP placed and pt transferred here for evaluation for CABG.   Dr. Orvan Seen evaluated and found he was not amenable to surgical or percutaneous revascularization due to poor targets. With severe depressed LV function and little coronary perfusion, LVAD was recommended.   HM III LVAD implanted on 06/25/19 by Dr. Orvan Seen under Destination Therapy criteria due to recent smoking history.  Pt lying in bed awake. Had a difficult night with severe left side chest pain. Pt experienced 10/10 chest pain requiring multiple PRN meds to control. Bedside echo obtained this morning. Pt states pain is well controlled this morning, but last night "felt exactly like my last heart attack that brought me to the hospital."    Plan to get out of bed with PT later this morning. Plan for PICC placement this afternoon.   Vital signs: Temp: 97.6 HR: 67 Doppler Pressure: 90 BP: 86/73 (81) O2 Sat: 96% on 4 L/[place] Wt: 207>225.7>222.6 lbs   LVAD parameters: Speed:  5200 Flow: 3.9 Power:  3.5w PI: 4.4 Hct: 31  Alarms: none Events:  30 PI events today  Fixed speed: 5200 Low speed limit: 4900   Drive Line: left abdominal DL dressing C/D/I with anchor intact and accurately applied. Daily dressing changes per VAD Coordinator, Nurse Davonna Belling, or trained caregiver.   Labs:  LDH trend: 694>854>627  INR trend: 1.4>1.3>1.1  Anticoagulation Plan: -INR Goal:  2.0 - 5.5 -ASA Dose: 325 mg daily until INR therapeutic  Blood Products:  -Intra-op - 06/25/19>>6 units FFP  Device: N/A  Drips: Milrinone 0.25 mcg/kg/min Lasix 8 mg/hr  Arrythmias: was on Amiodarone pre implant for NSVT  Respiratory: extubated 06/26/19  Nitric Oxide: off 06/26/19  VAD Education: 1. Per bedside RN- patient performed power source change independently with verbal cues this  morning. Pt tired this morning, and would like to rest.  2. No family at bedside. Plan for dressing change teaching with patient's sister tomorrow afternoon.   Plan/Recommendations:  1. Call VAD pager if any VAD equipment or drive line issues. 2. Daily dressing changes per VAD Coordinator, Nurse Davonna Belling, or trained caregiver.  Emerson Monte RN [place] Coordinator  Office: [phone]  24/7 Pager: [phone]

## 2019-06-29 NOTE — Progress Notes (Signed)
[facility] for IV heparin, Coumadin Indication: LVAD  Allergies  Allergen Reactions  . Codeine Hives    Patient Measurements: Height: 5\' 9"  (175.3 cm) Weight: 101 kg (222 lb 10.6 oz) IBW/kg (Calculated) : 70.7 Heparin Dosing Weight: ~ 92 kg  Vital Signs: Temp: 97.9 F (36.6 C) (06/28 0400) Temp Source: Oral (06/28 0400) BP: 106/85 (06/28 0600) Pulse Rate: 63 (06/28 0600)  Labs: Recent Labs    06/27/19 0500 06/27/19 1349 06/27/19 1758 06/28/19 0400 06/28/19 0400 06/28/19 1328 06/28/19 1937 06/29/19 0338 06/29/19 0616  HGB 9.7*  --   --  9.8*  --   --   --  9.7*  --   HCT 29.6*  --   --  29.7*  --   --   --  29.6*  --   PLT 153  --   --  149*  --   --   --  203  --   LABPROT 15.3*  --   --  13.9  --   --   --  14.0  --   INR 1.3*  --   --  1.1  --   --   --  1.1  --   HEPARINUNFRC  --    < >  --  <0.10*   < > <0.10* <0.10* 0.11*  --   CREATININE 1.27*  --  1.07 1.13  --   --   --   --  1.07   < > = values in this interval not displayed.    Estimated Creatinine Clearance: 91.4 mL/min (by C-G formula based on SCr of 1.07 mg/dL).   Medical History: Past Medical History:  Diagnosis Date  . Acute anterolateral wall MI ([place])   . CAD, multiple vessel   . Hypertension   . Ischemic cardiomyopathy   . Systolic heart failure ([place])   . Tobacco abuse     Medications:  Infusions:  . sodium chloride    . furosemide (LASIX) infusion 8 mg/hr (06/28/19 2232)  . heparin 950 Units/hr (06/28/19 2237)  . lactated ringers    . milrinone 0.125 mcg/kg/min (06/28/19 1900)  . norepinephrine (LEVOPHED) Adult infusion Stopped (06/27/19 1833)    Assessment: 54 yo male s/p LVAD placement 6/24.  Pharmacy asked to begin anticoagulation with IV heparin and Coumadin today.  No overt bleeding or complications noted.  Hgb low but stable post-op.  Heparin level 0.11, INR 1.1, CBC stable.  Goal of Therapy:  INR 2-2.5  Will not target specific  heparin level yet. Monitor platelets by anticoagulation protocol: Yes   Plan:  -Increase heparin to 1000 units/h -Warfarin 3mg  PO x1 tonight -Daily INR, CBC, heparin level -Recheck heparin level tonight  Arrie Senate, PharmD, BCPS Clinical Pharmacist [phone] Please check AMION for all [place] numbers 06/29/2019

## 2019-06-29 NOTE — Progress Notes (Addendum)
Patient ID: Aaron [name], male   DOB: 1965-06-21, 54 y.o.   MRN: [id number]     Advanced Heart Failure Rounding Note  PCP-Cardiologist: No primary care provider on file.   Subjective:    HM3 placement 6/24   He is on milrinone 0.125. Off NE. Co-ox marginal 54%  CVP 17. -2.4L in UOP yesterday. SCr 1.07. Today's wt not recorded yet.   Had pleuritic chest pain yesterday evening, eventually resolved w/ multiple pain meds. Currently CP free. CTs remain in place. -290 cc out yesterday.   Declining CPAP, unable to tolerate face mask.   LVAD Interrogation HM III: Speed: 5200 Flow: 3.9 PI: 3.6 Power: 3.5.  Multiple PI events    Objective:   Weight Range: 101 kg Body mass index is 32.88 kg/m.   Vital Signs:   Temp:  [97.3 F (36.3 C)-98.3 F (36.8 C)] 97.9 F (36.6 C) (06/28 0400) Pulse Rate:  [27-94] 63 (06/28 0600) Resp:  [10-24] 15 (06/28 0600) BP: (75-107)/(24-90) 106/85 (06/28 0600) SpO2:  [89 %-100 %] 97 % (06/28 0600) Last BM Date: 06/28/19  Weight change: Filed Weights   06/26/19 0500 06/27/19 0500 06/28/19 0500  Weight: 102.4 kg 100 kg 101 kg    Intake/Output:   Intake/Output Summary (Last 24 hours) at 06/29/2019 0737 Last data filed at 06/29/2019 0600 Gross per 24 hour  Intake 1120.03 ml  Output 2665 ml  Net -1544.97 ml      Physical Exam   General:  Middle aged WM, sitting up in bed. No distress HEENT: normal  Neck: supple. JVP elevated to jaw line. + Rt IJ. Carotids 2+ bilat; no bruits. No lymphadenopathy or thryomegaly appreciated. Cor: Sternal wound ok. LVAD hum.  + CTs  Lungs: Clear. Abdomen: soft, nontender, mildly distended. No hepatosplenomegaly. No bruits or masses. Good bowel sounds. Driveline site clean. Anchor in place.  Extremities: no cyanosis, clubbing, rash. Cold w/ bilateral trace LE edema  Neuro: alert & oriented x 3. No focal deficits. Moves all 4 without problem      Telemetry   NSR 60s Personally reviewed   Labs     CBC Recent Labs    06/28/19 0400 06/29/19 0338  WBC 7.4 9.4  NEUTROABS 5.2 7.0  HGB 9.8* 9.7*  HCT 29.7* 29.6*  MCV 99.0 99.3  PLT 149* 737   Basic Metabolic Panel Recent Labs    06/28/19 0400 06/29/19 0338 06/29/19 0616  NA 132*  --  132*  K 3.4*  --  4.0  CL 95*  --  95*  CO2 28  --  27  GLUCOSE 130*  --  99  BUN 11  --  13  CREATININE 1.13  --  1.07  CALCIUM 7.8*  --  8.3*  MG 2.1 2.2  --   PHOS 2.9 3.4  --    Liver Function Tests Recent Labs    06/27/19 0500 06/28/19 0400  AST 165* 90*  ALT 180* 146*  ALKPHOS 45 53  BILITOT 1.4* 0.7  PROT 5.4* 5.5*  ALBUMIN 3.0* 2.8*   No results for input(s): LIPASE, AMYLASE in the last 72 hours. Cardiac Enzymes No results for input(s): CKTOTAL, CKMB, CKMBINDEX, TROPONINI in the last 72 hours.  BNP: BNP (last 3 results) Recent Labs    06/17/19 2023 06/18/19 0119 06/26/19 0445  BNP 751.1* 1,068.5* 1,301.1*    ProBNP (last 3 results) No results for input(s): PROBNP in the last 8760 hours.   D-Dimer No results for input(s): DDIMER in  the last 72 hours. Hemoglobin A1C No results for input(s): HGBA1C in the last 72 hours. Fasting Lipid Panel No results for input(s): CHOL, HDL, LDLCALC, TRIG, CHOLHDL, LDLDIRECT in the last 72 hours. Thyroid Function Tests No results for input(s): TSH, T4TOTAL, T3FREE, THYROIDAB in the last 72 hours.  Invalid input(s): FREET3  Other results:   Imaging    DG CHEST PORT 1 VIEW  Result Date: 06/28/2019 CLINICAL DATA:  Chest pain EXAM: PORTABLE CHEST 1 VIEW COMPARISON:  06/28/2019 FINDINGS: LVAD remains in place. Cardiomegaly, vascular congestion. No visible effusions or pneumothorax. IMPRESSION: No significant change since prior study. Electronically Signed   By: Rolm Baptise M.D.   On: 06/28/2019 23:31     Medications:     Scheduled Medications: . acetaminophen  1,000 mg Oral Q6H   Or  . acetaminophen (TYLENOL) oral liquid 160 mg/5 mL  1,000 mg Per Tube Q6H  .  amiodarone  200 mg Oral BID  . aspirin EC  325 mg Oral Daily   Or  . aspirin  324 mg Per Tube Daily  . atorvastatin  80 mg Oral Daily  . bisacodyl  10 mg Oral Daily   Or  . bisacodyl  10 mg Rectal Daily  . Chlorhexidine Gluconate Cloth  6 each Topical Daily  . clonazePAM  0.5 mg Oral BID  . diazepam  2 mg Oral Q8H  . docusate sodium  200 mg Oral Daily  . folic acid  1 mg Oral Daily  . insulin aspart  0-15 Units Subcutaneous TID WC  . ketorolac  15 mg Intravenous Q6H  . mouth rinse  15 mL Mouth Rinse BID  . mouth rinse  15 mL Mouth Rinse BID  . multivitamin with minerals  1 tablet Oral Daily  . pantoprazole  40 mg Oral Daily  . thiamine  100 mg Oral Daily  . Warfarin - Pharmacist Dosing Inpatient   Does not apply q1600    Infusions: . sodium chloride    . furosemide (LASIX) infusion 8 mg/hr (06/28/19 2232)  . heparin 950 Units/hr (06/28/19 2237)  . lactated ringers    . milrinone 0.125 mcg/kg/min (06/28/19 1900)  . norepinephrine (LEVOPHED) Adult infusion Stopped (06/27/19 1833)    PRN Medications: acetaminophen, ALPRAZolam, bisacodyl, calcium carbonate, dextrose, fentaNYL (SUBLIMAZE) injection, morphine injection, nitroGLYCERIN, ondansetron (ZOFRAN) IV, oxyCODONE, [START ON 07/02/2019] pneumococcal 23 valent vaccine, sodium chloride flush, traMADol, traZODone    Patient Profile   Aaron [name] --post MI cardiogenic shock/ optimization prior to anticipated CABG, at the request of Dr. Marlou Porch, Cardiology.    Assessment/Plan    1. Acute Anterolateral STEMI/ Multivessel CAD - Emergent cath showed severe multivessel disease w/ 75% mid- distal LM, 100% prox LAD, not amendable to PCI (unable to cross w/ guide wire, probable CTO), 100% prox LCx (probable CTO) and 100% ostial RCA (probable CTO). There were mild to moderate collaterals right to left to the LAD, minimal right to right collaterals an minimal left to left collaterals. LVEF 25% - Hs trop peaked 27,000. - No targets  for CABG / PCI.   - S/P HMIII VAD  2.  Acute Systolic Heart Failure --->Cardiogenic Shock - 2/2 acute MI. LVEF by LVG 25% - IABP out 6/18 but replaced 6/23. Removed in OR on 6/24  - HMIII LVAD  Placed 6/24 - Extubated 6/25  - remains on milrinone 0.125. NE turned off 6/26. Co-ox 54%. Continue milrinone 0.125 today - Remains volume overloaded. Continue lasix gtt, 8 mg/hr.  -  On low dose heparin gtt today + warfarin.  Discussed dosing with PharmD personally. No bleeding.  - Ramp echo later this week  3. NSVT - improved - keep K> 4.0 Mg >2.0  - Continue PO amio 200 mg bid - Replace electrolytes as needed    4  Hypertension  - reported h/o untreated HTN prior to admit  5. Tobacco Abuse - 30 pack years  - smoking cessation advised  - Will need to quit if to be considered for heart transplant in the future   6. Elevated LFTs due to shock liver - improved  7. Renal mass = RCC - MRI abdomen- 1.8 cm right renal lesion, most consistent with renal cell carcinoma. No evidence of renal vein involvement or nodal metastasis. - Discussed w/ Urology and R RCC is small and will just require surveillance.  Ok to proceed with VAD from their standpoint.   8. Hyponatremia  - Sodium 132 stable  9. Sleep Apnea - frequent apnea at night  - needs formal sleep study   - CPAP ordered while inpatient  10. Hypokalemia - resolved today. K 4.0 - monitor w/ diuresis   Consider removing Rt IJ CVC today and replacing w/ PICC.    Lyda Jester PA-C  06/29/2019 7:37 AM   Patient seen and examined with the above-signed Advanced Practice Provider and/or Housestaff. I personally reviewed laboratory data, imaging studies and relevant notes. I independently examined the patient and formulated the important aspects of the plan. I have edited the note to reflect any of my changes or salient points. I have personally discussed the plan with the patient and/or family.  Developed severe CP and  diaphoresis overnight. Says he felt like his MI. Last for about 1 hour and resolved. Remains on milrinone 0.125. Co-ox marginal 0.125. Still with some volume on board  General:  Lying in bed  NAD.  HEENT: normal  Neck: supple. RIJ introducer  Carotids 2+ bilat; no bruits. No lymphadenopathy or thryomegaly appreciated. Cor: LVAD hum. Sternal wound ok  Lungs: Clear. Abdomen: obese soft, nontender, non-distended. No hepatosplenomegaly. No bruits or masses. Good bowel sounds. Driveline site clean. Anchor in place.  Extremities: no cyanosis, clubbing, rash. Warm trace edema  Neuro: alert & oriented x 3. No focal deficits. Moves all 4 without problem   Stat echo done at bedside with me present to exclude pericardial effusion. No effusion. VAD position stable. RV looks ok. Will continue milrinone today and IV diuresis. INR 1.1. Will continue low-dose heparin very carefully. Discussed dosing with PharmD personally. VAD interrogated personally. Parameters stable. Place PICC. Remove introducer. Keep in ICU today.  CRITICAL CARE Performed by: Glori Bickers  Total critical care time: 35 minutes  Critical care time was exclusive of separately billable procedures and treating other patients.  Critical care was necessary to treat or prevent imminent or life-threatening deterioration.  Critical care was time spent personally by me (independent of midlevel providers or residents) on the following activities: development of treatment plan with patient and/or surrogate as well as nursing, discussions with consultants, evaluation of patient's response to treatment, examination of patient, obtaining history from patient or surrogate, ordering and performing treatments and interventions, ordering and review of laboratory studies, ordering and review of radiographic studies, pulse oximetry and re-evaluation of patient's condition.  Glori Bickers, MD  3:50 PM

## 2019-06-29 NOTE — Progress Notes (Signed)
  Echocardiogram 2D Echocardiogram has been performed.  Michiel Cowboy 06/29/2019, 10:46 AM

## 2019-06-29 NOTE — Progress Notes (Signed)
[place] for IV heparin, Coumadin Indication: LVAD  Allergies  Allergen Reactions  . Codeine Hives    Patient Measurements: Height: 5\' 9"  (175.3 cm) Weight: 101 kg (222 lb 10.6 oz) IBW/kg (Calculated) : 70.7 Heparin Dosing Weight: ~ 92 kg  Vital Signs: Temp: 98 F (36.7 C) (06/28 1547) Temp Source: Oral (06/28 1547) BP: 108/93 (06/28 1800) Pulse Rate: 63 (06/28 1800)  Labs: Recent Labs    06/27/19 0500 06/27/19 1349 06/27/19 1758 06/28/19 0400 06/28/19 1328 06/28/19 1937 06/29/19 0338 06/29/19 0616 06/29/19 1830  HGB 9.7*  --   --  9.8*  --   --  9.7*  --   --   HCT 29.6*  --   --  29.7*  --   --  29.6*  --   --   PLT 153  --   --  149*  --   --  203  --   --   LABPROT 15.3*  --   --  13.9  --   --  14.0  --   --   INR 1.3*  --   --  1.1  --   --  1.1  --   --   HEPARINUNFRC  --    < >  --  <0.10*   < > <0.10* 0.11*  --  0.16*  CREATININE 1.27*  --  1.07 1.13  --   --   --  1.07  --    < > = values in this interval not displayed.    Estimated Creatinine Clearance: 91.4 mL/min (by C-G formula based on SCr of 1.07 mg/dL).   Medical History: Past Medical History:  Diagnosis Date  . Acute anterolateral wall MI ([place])   . CAD, multiple vessel   . Hypertension   . Ischemic cardiomyopathy   . Systolic heart failure ([place])   . Tobacco abuse     Medications:  Infusions:  . sodium chloride    . furosemide (LASIX) infusion 8 mg/hr (06/29/19 1900)  . heparin 1,000 Units/hr (06/29/19 1900)  . lactated ringers    . milrinone 0.125 mcg/kg/min (06/29/19 1900)  . norepinephrine (LEVOPHED) Adult infusion Stopped (06/27/19 1833)    Assessment: 54 yo male s/p LVAD HM3 placement 6/24.  Pharmacy asked to dose  anticoagulation with IV heparin and Coumadin.  No overt bleeding or complications noted.  Hgb low but stable post-op.  Heparin level 0.16 approaching goal of about 0.2 on heparin drip 1000 uts/hr, INR 1.1, CBC stable. Some  pocket drainage stable   Goal of Therapy:  INR 2-2.5  HL gaol about 0.2  Monitor platelets by anticoagulation protocol: Yes   Plan:  -continue  heparin to 1000 units/h - titrating up slowly  -Warfarin 3mg  PO x1 tonight -Daily INR, CBC, heparin level -Recheck heparin level tonight  Bonnita Nasuti Pharm.D. CPP, BCPS Clinical Pharmacist [phone] 06/29/2019 7:44 PM    Please check AMION for all [place] numbers 06/29/2019

## 2019-06-29 NOTE — Progress Notes (Signed)
RT NOTE:  Pt has refused CPAP therapy the past fews nights and stated he is not planning to wear. Device was removed from room last shift.

## 2019-06-29 NOTE — Progress Notes (Signed)
Patient ID: Aaron [name], male   DOB: [birth year], 54 y.o.   MRN: [id number]    This NP visited patient at the bedside as a follow up for palliative medicine needs and emotional support.  Patient is POD 4--HM3 placement 6/24 and he is doing well.  He just got back to room after walking in the halls with PT.  His mood is positive.  His sister will be in to visit later today.  Emotional support offered.  PMT will continue to support holistically  No charge  Wadie Lessen NP  Palliative Medicine Team Team Phone # [phone] Pager [phone]

## 2019-06-29 NOTE — Progress Notes (Signed)
CSW attempted to visit bedside although patient was resting comfortably. CSW will follow up tomorrow. Raquel Sarna, [place], [place]

## 2019-06-29 NOTE — Progress Notes (Signed)
Peripherally Inserted Central Catheter Placement  The IV Nurse has discussed with the patient and/or persons authorized to consent for the patient, the purpose of this procedure and the potential benefits and risks involved with this procedure.  The benefits include less needle sticks, lab draws from the catheter, and the patient may be discharged home with the catheter. Risks include, but not limited to, infection, bleeding, blood clot (thrombus formation), and puncture of an artery; nerve damage and irregular heartbeat and possibility to perform a PICC exchange if needed/ordered by physician.  Alternatives to this procedure were also discussed.  Bard Power PICC patient education guide, fact sheet on infection prevention and patient information card has been provided to patient /or left at bedside.    PICC Placement Documentation  PICC Double Lumen 77/37/36 PICC Right Basilic 40 cm 0 cm (Active)  Indication for Insertion or Continuance of Line Vasoactive infusions 06/29/19 1400  Exposed Catheter (cm) 0 cm 06/29/19 1400  Site Assessment Clean;Dry;Intact 06/29/19 1400  Lumen #1 Status Flushed;Saline locked;Blood return noted 06/29/19 1400  Lumen #2 Status Flushed;Saline locked;Blood return noted 06/29/19 1400  Dressing Type Transparent;Securing device 06/29/19 1400  Dressing Status Clean;Dry;Intact;Antimicrobial disc in place 06/29/19 1400  [place] checked and tightened 06/29/19 1400  Dressing Intervention New dressing 06/29/19 1400  Dressing Change Due 07/06/19 06/29/19 1400       Virgilio Belling 06/29/2019, 2:19 PM

## 2019-06-30 ENCOUNTER — Inpatient Hospital Stay (HOSPITAL_COMMUNITY): Payer: BC Managed Care – PPO

## 2019-06-30 LAB — CBC WITH DIFFERENTIAL/PLATELET
Abs Immature Granulocytes: 0.08 10*3/uL — ABNORMAL HIGH (ref 0.00–0.07)
Basophils Absolute: 0 10*3/uL (ref 0.0–0.1)
Basophils Relative: 0 %
Eosinophils Absolute: 0.2 10*3/uL (ref 0.0–0.5)
Eosinophils Relative: 2 %
HCT: 31.8 % — ABNORMAL LOW (ref 39.0–52.0)
Hemoglobin: 10.2 g/dL — ABNORMAL LOW (ref 13.0–17.0)
Immature Granulocytes: 1 %
Lymphocytes Relative: 9 %
Lymphs Abs: 0.8 10*3/uL (ref 0.7–4.0)
MCH: 32.3 pg (ref 26.0–34.0)
MCHC: 32.1 g/dL (ref 30.0–36.0)
MCV: 100.6 fL — ABNORMAL HIGH (ref 80.0–100.0)
Monocytes Absolute: 0.6 10*3/uL (ref 0.1–1.0)
Monocytes Relative: 7 %
Neutro Abs: 7.4 10*3/uL (ref 1.7–7.7)
Neutrophils Relative %: 81 %
Platelets: 225 10*3/uL (ref 150–400)
RBC: 3.16 MIL/uL — ABNORMAL LOW (ref 4.22–5.81)
RDW: 13.4 % (ref 11.5–15.5)
WBC: 9.2 10*3/uL (ref 4.0–10.5)
nRBC: 0 % (ref 0.0–0.2)

## 2019-06-30 LAB — COOXEMETRY PANEL
Carboxyhemoglobin: 1.4 % (ref 0.5–1.5)
Methemoglobin: 1.1 % (ref 0.0–1.5)
O2 Saturation: 52.4 %
Total hemoglobin: 10.6 g/dL — ABNORMAL LOW (ref 12.0–16.0)

## 2019-06-30 LAB — BASIC METABOLIC PANEL
Anion gap: 10 (ref 5–15)
BUN: 17 mg/dL (ref 6–20)
CO2: 26 mmol/L (ref 22–32)
Calcium: 8.3 mg/dL — ABNORMAL LOW (ref 8.9–10.3)
Chloride: 95 mmol/L — ABNORMAL LOW (ref 98–111)
Creatinine, Ser: 1.18 mg/dL (ref 0.61–1.24)
GFR calc Af Amer: >60 mL/min (ref >60)
GFR calc non Af Amer: >60 mL/min (ref >60)
Glucose, Bld: 196 mg/dL — ABNORMAL HIGH (ref 70–99)
Potassium: 4.1 mmol/L (ref 3.5–5.1)
Sodium: 131 mmol/L — ABNORMAL LOW (ref 135–145)

## 2019-06-30 LAB — GLUCOSE, CAPILLARY
Glucose-Capillary: 194 mg/dL — ABNORMAL HIGH (ref 70–99)
Glucose-Capillary: 126 mg/dL — ABNORMAL HIGH (ref 70–99)
Glucose-Capillary: 108 mg/dL — ABNORMAL HIGH (ref 70–99)
Glucose-Capillary: 117 mg/dL — ABNORMAL HIGH (ref 70–99)

## 2019-06-30 LAB — MAGNESIUM: Magnesium: 2.1 mg/dL (ref 1.7–2.4)

## 2019-06-30 LAB — LACTATE DEHYDROGENASE: LDH: 459 U/L — ABNORMAL HIGH (ref 98–192)

## 2019-06-30 LAB — PROTIME-INR
INR: 1.2 (ref 0.8–1.2)
Prothrombin Time: 15 seconds (ref 11.4–15.2)

## 2019-06-30 LAB — PHOSPHORUS: Phosphorus: 3.7 mg/dL (ref 2.5–4.6)

## 2019-06-30 LAB — HEPARIN LEVEL (UNFRACTIONATED): Heparin Unfractionated: 0.18 IU/mL — ABNORMAL LOW (ref 0.30–0.70)

## 2019-06-30 MED ORDER — COLCHICINE 0.6 MG PO TABS
0.6000 mg | ORAL_TABLET | Freq: Two times a day (BID) | ORAL | Status: DC
  Administered 2019-06-30 – 2019-07-10 (×19): 0.6 mg via ORAL
  Filled 2019-06-30 (×19): qty 1

## 2019-06-30 MED ORDER — GABAPENTIN 300 MG PO CAPS
300.0000 mg | ORAL_CAPSULE | Freq: Two times a day (BID) | ORAL | Status: DC
  Administered 2019-06-30 – 2019-07-02 (×5): 300 mg via ORAL
  Filled 2019-06-30 (×5): qty 1

## 2019-06-30 MED ORDER — DIGOXIN 125 MCG PO TABS
0.1250 mg | ORAL_TABLET | Freq: Every day | ORAL | Status: DC
  Administered 2019-06-30 – 2019-07-10 (×11): 0.125 mg via ORAL
  Filled 2019-06-30 (×11): qty 1

## 2019-06-30 MED ORDER — WARFARIN SODIUM 5 MG PO TABS
5.0000 mg | ORAL_TABLET | Freq: Once | ORAL | Status: AC
  Administered 2019-06-30: 5 mg via ORAL
  Filled 2019-06-30: qty 1

## 2019-06-30 MED FILL — Potassium Chloride Inj 2 mEq/ML: INTRAVENOUS | Qty: 40 | Status: AC

## 2019-06-30 MED FILL — Heparin Sodium (Porcine) Inj 1000 Unit/ML: INTRAMUSCULAR | Qty: 30 | Status: AC

## 2019-06-30 MED FILL — Magnesium Sulfate Inj 50%: INTRAMUSCULAR | Qty: 10 | Status: AC

## 2019-06-30 NOTE — Progress Notes (Signed)
[place] for IV heparin, Coumadin Indication: LVAD  Allergies  Allergen Reactions  . Codeine Hives    Patient Measurements: Height: 5\' 9"  (175.3 cm) Weight: 97.2 kg (214 lb 4.6 oz) IBW/kg (Calculated) : 70.7 Heparin Dosing Weight: ~ 92 kg  Vital Signs: Temp: 97.7 F (36.5 C) (06/29 0754) Temp Source: Oral (06/29 0754) BP: 92/77 (06/29 1000) Pulse Rate: 70 (06/29 1000)  Labs: Recent Labs    06/28/19 0400 06/28/19 1328 06/29/19 0338 06/29/19 0616 06/29/19 1830 06/30/19 0815 06/30/19 0945  HGB 9.8*  --  9.7*  --   --  10.2*  --   HCT 29.7*  --  29.6*  --   --  31.8*  --   PLT 149*  --  203  --   --  225  --   LABPROT 13.9  --  14.0  --   --   --  15.0  INR 1.1  --  1.1  --   --   --  1.2  HEPARINUNFRC <0.10*   < > 0.11*  --  0.16*  --  0.18*  CREATININE 1.13  --   --  1.07  --   --  1.18   < > = values in this interval not displayed.    Estimated Creatinine Clearance: 81.3 mL/min (by C-G formula based on SCr of 1.18 mg/dL).   Medical History: Past Medical History:  Diagnosis Date  . Acute anterolateral wall MI ([place])   . CAD, multiple vessel   . Hypertension   . Ischemic cardiomyopathy   . Systolic heart failure ([place])   . Tobacco abuse     Medications:  Infusions:  . sodium chloride    . furosemide (LASIX) infusion 8 mg/hr (06/30/19 1000)  . heparin 1,000 Units/hr (06/30/19 1000)  . lactated ringers    . milrinone 0.125 mcg/kg/min (06/30/19 1000)  . norepinephrine (LEVOPHED) Adult infusion Stopped (06/27/19 1833)    Assessment: 54 yo male s/p LVAD HM3 placement 6/24.  Pharmacy asked to dose  anticoagulation with IV heparin and Coumadin.  No overt bleeding or complications noted.  Hgb low but stable post-op.  Heparin level 0.18, close to goal, INR 1.2, CBC stable, LDH up slightly.   Goal of Therapy:  INR 2-2.5  HL goal about 0.2  Monitor platelets by anticoagulation protocol: Yes   Plan:  -Continue heparin  1000 units/h -Warfarin 5mg  PO x1 tonight -Daily INR, heparin level, CBC, LDH   Arrie Senate, PharmD, BCPS Clinical Pharmacist [phone] Please check AMION for all [place] numbers 06/30/2019

## 2019-06-30 NOTE — Progress Notes (Signed)
CSW attempted to visit with patient at bedside although patient was sound asleep. No family present at time of visit. CSW continues to follow for supportive intervention throughout implant hospitalization. Raquel Sarna, [place], [place]

## 2019-06-30 NOTE — Progress Notes (Signed)
Nutrition Follow-up  DOCUMENTATION CODES:   Not applicable  INTERVENTION:   Ensure Enlive po BID, each supplement provides 350 kcal and 20 grams of protein  MVI with [place]   NUTRITION DIAGNOSIS:   Inadequate oral intake related to poor appetite as evidenced by meal completion < 50%.  Being addressed as appetite improving, supplements  GOAL:   Patient will meet greater than or equal to 90% of their needs  Progressing  MONITOR:   PO intake, Supplement acceptance, Weight trends, Labs, I & O's  REASON FOR ASSESSMENT:   Consult LVAD Eval  ASSESSMENT:   Patient with PMH significant for HTN. Presents this admission with STEMI and acute systolic heart failure.  6/24 HM3 LVAD placed  Recorded po intake 0-75% of meals; pt ate 75% at breakfast this AM  Current weight 97 kg; net negative 3.5 L. Admit weight 95.5 kg. Weight up post LVAD placement but trending back down on lasix drip  Labs: sodium 131 (L) Meds: calcium carbonate, folic acid, lasix drip, milrinone drip, MVI with Minerals, thiamine,   Diet Order:   Diet Order            Diet Heart Room service appropriate? Yes; Fluid consistency: Thin  Diet effective now                 EDUCATION NEEDS:   Not appropriate for education at this time  Skin:  Skin Assessment: Reviewed RN Assessment  Last BM:  6/28  Height:   Ht Readings from Last 1 Encounters:  06/18/19 5\' 9"  (1.753 m)    Weight:   Wt Readings from Last 1 Encounters:  06/30/19 97.2 kg    BMI:  Body mass index is 31.64 kg/m.  Estimated Nutritional Needs:   Kcal:  2300-2500 kcal  Protein:  115-130 grams  Fluid:  >/= 2.3 L/day   Kerman Passey MS, RDN, LDN, CNSC Registered Dietitian III RD Pager Number and RD On-Call Pager Number Located in [place]

## 2019-06-30 NOTE — Progress Notes (Signed)
Physical Therapy Treatment Patient Details Name: Aaron [name] MRN: [id number] DOB: 20-Oct-1965 Today's Date: 06/30/2019    History of Present Illness Pt is 54yo admitted 6/16 with STEMI with multivessel disease. cath with no viable targets for CABG. Pt s/p HeartMate 3 LVAD implantation 6/24. PMHx: HTN, tobacco use (30pack yrs)    PT Comments    Pt supine on arrival with pt educated prior to session of planned therapy arrival. Pt walked early this morning and agreeable to gait with return to bed. Pt continues to transition to and from power well with only min cues for corrections of line management. Pt with ability to walk in hall with continued standing rests on RA with reports of 3/10 dizziness with BP stable. Will continue to follow and encouraged OOB for meals and further gait with nursing.   Speed 5200, flow 4.6-3.9, PI 2.5-4.7, power 3.6 HR 70-78 bP 112/95 (102) 91-92% on RA with activity    Follow Up Recommendations  Home health PT;Supervision/Assistance - 24 hour     Equipment Recommendations  Other (comment) (rollator)    Recommendations for Other Services       Precautions / Restrictions Precautions Precautions: Fall;Other (comment);Sternal Precaution Comments: LVAD, chest tube, sternal precautions    Mobility  Bed Mobility Overal bed mobility: Needs Assistance Bed Mobility: Supine to Sit;Sit to Supine     Supine to sit: Min guard Sit to supine: Min guard   General bed mobility comments: guarding for line and precautions with cues to prevent sternal retraction  Transfers Overall transfer level: Needs assistance   Transfers: Sit to/from Stand Sit to Stand: Min guard         General transfer comment: cues for hand placement and safety  Ambulation/Gait Ambulation/Gait assistance: Min guard Gait Distance (Feet): 300 Feet Assistive device: Rolling walker (2 wheeled) Gait Pattern/deviations: Step-through pattern;Decreased stride length;Trunk flexed    Gait velocity interpretation: 1.31 - 2.62 ft/sec, indicative of limited community ambulator General Gait Details: 3 standing rest breaks during gait with cues for posture and RW use as well as direction to room   Stairs             Wheelchair Mobility    Modified Rankin (Stroke Patients Only)       Balance Overall balance assessment: Mild deficits observed, not formally tested                                          Cognition Arousal/Alertness: Awake/alert Behavior During Therapy: Flat affect Overall Cognitive Status: Within Functional Limits for tasks assessed                                        Exercises      General Comments        Pertinent Vitals/Pain Pain Score: 5  Pain Location: chest pain after gait Pain Descriptors / Indicators: Sore;Guarding;Aching Pain Intervention(s): Limited activity within patient's tolerance;Patient requesting pain meds-RN notified;Monitored during session;Repositioned    Home Living                      Prior Function            PT Goals (current goals can now be found in the care plan section) Progress towards PT goals: Progressing toward goals  Frequency    Min 4X/week      PT Plan Current plan remains appropriate    Co-evaluation              AM-PAC PT "6 Clicks" Mobility   Outcome Measure  Help needed turning from your back to your side while in a flat bed without using bedrails?: A Little Help needed moving from lying on your back to sitting on the side of a flat bed without using bedrails?: A Little Help needed moving to and from a bed to a chair (including a wheelchair)?: A Little Help needed standing up from a chair using your arms (e.g., wheelchair or bedside chair)?: A Little Help needed to walk in hospital room?: A Little Help needed climbing 3-5 steps with a railing? : A Lot 6 Click Score: 17    End of Session   Activity Tolerance: Patient  tolerated treatment well Patient left: in bed;with call bell/phone within reach Nurse Communication: Mobility status PT Visit Diagnosis: Other abnormalities of gait and mobility (R26.89)     Time: 4975-3005 PT Time Calculation (min) (ACUTE ONLY): 32 min  Charges:  $Gait Training: 8-22 mins $Therapeutic Activity: 8-22 mins                     Addilynn Mowrer P, PT Acute Rehabilitation Services Pager: [phone] Office: [place] 06/30/2019, 1:49 PM

## 2019-06-30 NOTE — Progress Notes (Addendum)
Patient ID: Aaron [name], male   DOB: 1965-07-25, 54 y.o.   MRN: [id number]     Advanced Heart Failure Rounding Note  PCP-Cardiologist: No primary care provider on file.   Subjective:    HM3 placement 6/24   He is on milrinone 0.125. Off NE. Co-ox marginal 52%. CVP 14. Wt down 8 lb from yesterday. Doubt I/Os accurate.   Echo repeated yesterday for CP. No pericardial effusion. VAD position stable. RV looks ok.   Ambulated  w/ PT this am. Now back in bed. Feels tired. No further CP but complains of abdominal discomfort. ? Pocket pain. Had BM yesterday.   LDH up from 347>>459. BMP and INR labs pending.     LVAD Interrogation HM III: Speed: 5250 Flow: 4.0 PI: 4.0 Power: 3.6.  11 PI events    Objective:   Weight Range: 97.2 kg Body mass index is 31.64 kg/m.   Vital Signs:   Temp:  [97.5 F (36.4 C)-98 F (36.7 C)] 97.7 F (36.5 C) (06/29 0754) Pulse Rate:  [29-69] 66 (06/29 0100) Resp:  [15-24] 15 (06/29 0600) BP: (64-112)/(49-95) 112/95 (06/29 0600) SpO2:  [91 %-100 %] 98 % (06/29 0100) Weight:  [97.2 kg] 97.2 kg (06/29 0631) Last BM Date: 06/29/19  Weight change: Filed Weights   06/27/19 0500 06/28/19 0500 06/30/19 0631  Weight: 100 kg 101 kg 97.2 kg    Intake/Output:   Intake/Output Summary (Last 24 hours) at 06/30/2019 0911 Last data filed at 06/30/2019 0600 Gross per 24 hour  Intake 417.8 ml  Output 1165 ml  Net -747.2 ml      Physical Exam   CVP 14: General:  Fatigue appearing. Laying in bed. No respiratory difficulty HEENT: normal Neck: supple. JVD elevated to ear. Carotids 2+ bilat; no bruits. No lymphadenopathy or thyromegaly appreciated. Cor:+ LVAD HUM + CTs Lungs: clear. No wheezing  Abdomen: soft, nontender, nondistended. No hepatosplenomegaly. No bruits or masses. Good bowel sounds. drive line site clean. Anchor in place.  Extremities: no cyanosis, clubbing, rash, edema +RUE PICC GU: + Foley  Neuro: alert & oriented x 3, cranial nerves  grossly intact. moves all 4 extremities w/o difficulty. Affect pleasant.  Telemetry   NSR 70s Personally reviewed   Labs    CBC Recent Labs    06/28/19 0400 06/29/19 0338  WBC 7.4 9.4  NEUTROABS 5.2 7.0  HGB 9.8* 9.7*  HCT 29.7* 29.6*  MCV 99.0 99.3  PLT 149* 629   Basic Metabolic Panel Recent Labs    06/28/19 0400 06/29/19 0338 06/29/19 0616  NA 132*  --  132*  K 3.4*  --  4.0  CL 95*  --  95*  CO2 28  --  27  GLUCOSE 130*  --  99  BUN 11  --  13  CREATININE 1.13  --  1.07  CALCIUM 7.8*  --  8.3*  MG 2.1 2.2  --   PHOS 2.9 3.4  --    Liver Function Tests Recent Labs    06/28/19 0400  AST 90*  ALT 146*  ALKPHOS 53  BILITOT 0.7  PROT 5.5*  ALBUMIN 2.8*   No results for input(s): LIPASE, AMYLASE in the last 72 hours. Cardiac Enzymes No results for input(s): CKTOTAL, CKMB, CKMBINDEX, TROPONINI in the last 72 hours.  BNP: BNP (last 3 results) Recent Labs    06/17/19 2023 06/18/19 0119 06/26/19 0445  BNP 751.1* 1,068.5* 1,301.1*    ProBNP (last 3 results) No results for input(s): PROBNP  in the last 8760 hours.   D-Dimer No results for input(s): DDIMER in the last 72 hours. Hemoglobin A1C No results for input(s): HGBA1C in the last 72 hours. Fasting Lipid Panel No results for input(s): CHOL, HDL, LDLCALC, TRIG, CHOLHDL, LDLDIRECT in the last 72 hours. Thyroid Function Tests No results for input(s): TSH, T4TOTAL, T3FREE, THYROIDAB in the last 72 hours.  Invalid input(s): FREET3  Other results:   Imaging    DG Chest 2 View  Result Date: 06/30/2019 CLINICAL DATA:  History of open heart surgery. EXAM: CHEST - 2 VIEW COMPARISON:  06/28/2019. FINDINGS: Interim removal of right IJ sheath. Interim placement right PICC line, its tip is at the cavoatrial junction. Prior median sternotomy. Left ventricular assist device again noted. Cardiomegaly. Mild bilateral interstitial prominence. Mild CHF cannot be excluded. Tiny left pleural effusion. No  pneumothorax. IMPRESSION: 1. Interim removal of right IJ sheath. Interim placement of right PICC line, its tip is at the cavoatrial junction. 2. Prior median sternotomy. Left ventricular assist device again noted. Cardiomegaly. Mild bilateral interstitial prominence. Tiny left pleural effusion. Mild CHF cannot be excluded. Electronically Signed   By: Marcello Moores  Register   On: 06/30/2019 08:22   ECHOCARDIOGRAM LIMITED  Result Date: 06/29/2019    ECHOCARDIOGRAM LIMITED REPORT   Patient Name:   Aaron [name] Date of Exam: 06/29/2019 Medical Rec #:  [id number]       Height:       69.0 in Accession #:    [id number]      Weight:       222.7 lb Date of Birth:  [birth year]       BSA:          2.162 m Patient Age:    17 years        BP:           0/0 mmHg Patient Gender: M               HR:           71 bpm. Exam Location:  Inpatient Procedure: Limited Echo STAT ECHO Indications:    Chest Pain 786.50 / R07.9  History:        Patient has prior history of Echocardiogram examinations, most                 recent 06/19/2019. CHF and Cardiomyopathy, Arrythmias:STEMI; Risk                 Factors:Current Smoker. LVAD.  Sonographer:    Vickie Epley RDCS Referring Phys: [id number] Maia Handa R Oliana Gowens IMPRESSIONS  1. The left ventricle has severely decreased function. The left ventricle demonstrates global hypokinesis. There is severe asymmetric left ventricular hypertrophy.  2. Right ventricular systolic function was not well visualized. The right ventricular size is mildly enlarged.  3. No pericardial effusion seen.  4. FINDINGS  Left Ventricle: The left ventricle has severely decreased function. The left ventricle demonstrates global hypokinesis. The left ventricular internal cavity size was normal in size. There is severe asymmetric left ventricular hypertrophy. Right Ventricle: The right ventricular size is mildly enlarged. Right ventricular systolic function was not well visualized. Right Atrium: Right atrial size was not well  visualized. Pericardium: No pericardial effusion seen. There is no evidence of pericardial effusion. Tricuspid Valve: The tricuspid valve is not well visualized. Tricuspid valve regurgitation not assessed. Aortic Valve: The aortic valve is grossly normal. Pulmonic Valve: The pulmonic valve was normal in structure. Pulmonic valve regurgitation not assessed. Nevin Bloodgood  Harrington Challenger MD Electronically signed by Dorris Carnes MD Signature Date/Time: 06/29/2019/11:11:58 AM    Final    [place] EKG SITE RITE  Result Date: 06/29/2019 If Site Rite image not attached, placement could not be confirmed due to current cardiac rhythm.    Medications:     Scheduled Medications: . acetaminophen  1,000 mg Oral Q6H   Or  . acetaminophen (TYLENOL) oral liquid 160 mg/5 mL  1,000 mg Per Tube Q6H  . amiodarone  200 mg Oral BID  . aspirin EC  325 mg Oral Daily   Or  . aspirin  324 mg Per Tube Daily  . atorvastatin  80 mg Oral Daily  . bisacodyl  10 mg Oral Daily   Or  . bisacodyl  10 mg Rectal Daily  . Chlorhexidine Gluconate Cloth  6 each Topical Daily  . clonazePAM  0.5 mg Oral BID  . diazepam  2 mg Oral Q8H  . docusate sodium  200 mg Oral Daily  . folic acid  1 mg Oral Daily  . gabapentin  300 mg Oral Daily  . insulin aspart  0-15 Units Subcutaneous TID WC  . mouth rinse  15 mL Mouth Rinse BID  . mouth rinse  15 mL Mouth Rinse BID  . multivitamin with minerals  1 tablet Oral Daily  . pantoprazole  40 mg Oral Daily  . sodium chloride flush  10-40 mL Intracatheter Q12H  . thiamine  100 mg Oral Daily  . Warfarin - Pharmacist Dosing Inpatient   Does not apply q1600    Infusions: . sodium chloride    . furosemide (LASIX) infusion 8 mg/hr (06/29/19 1900)  . heparin 1,000 Units/hr (06/30/19 0341)  . lactated ringers    . milrinone 0.125 mcg/kg/min (06/29/19 1900)  . norepinephrine (LEVOPHED) Adult infusion Stopped (06/27/19 1833)    PRN Medications: acetaminophen, ALPRAZolam, bisacodyl, calcium carbonate, dextrose,  fentaNYL (SUBLIMAZE) injection, morphine injection, nitroGLYCERIN, ondansetron (ZOFRAN) IV, oxyCODONE, [START ON 07/02/2019] pneumococcal 23 valent vaccine, sodium chloride flush, sodium chloride flush, traMADol, traZODone    Patient Profile   Aaron [name] --post MI cardiogenic shock/ optimization prior to anticipated CABG, at the request of Dr. Marlou Porch, Cardiology.    Assessment/Plan    1. Acute Anterolateral STEMI/ Multivessel CAD - Emergent cath showed severe multivessel disease w/ 75% mid- distal LM, 100% prox LAD, not amendable to PCI (unable to cross w/ guide wire, probable CTO), 100% prox LCx (probable CTO) and 100% ostial RCA (probable CTO). There were mild to moderate collaterals right to left to the LAD, minimal right to right collaterals an minimal left to left collaterals. LVEF 25% - Hs trop peaked 27,000. - No targets for CABG / PCI.   - S/P HMIII VAD  2.  Acute Systolic Heart Failure --->Cardiogenic Shock - 2/2 acute MI. LVEF by LVG 25% - IABP out 6/18 but replaced 6/23. Removed in OR on 6/24  - HMIII LVAD  Placed 6/24 - Extubated 6/25  - remains on milrinone 0.125. NE turned off 6/26. Co-ox 52%. Continue milrinone 0.125 today - Remains volume overloaded. CVP 14. Continue lasix gtt, 8 mg/hr. Monitor UOP  - On low dose heparin gtt today + warfarin. INR pending.  - LDH up from 347>>459. Monitor closely  - Ramp echo later this week - ? Pocket Pain. Increase gabapentin to 300 mg bid   3. NSVT - improved - keep K> 4.0 Mg >2.0  - Continue PO amio 200 mg bid - Replace electrolytes as needed  4  Hypertension  - reported h/o untreated HTN prior to admit  5. Tobacco Abuse - 30 pack years  - smoking cessation advised  - Will need to quit if to be considered for heart transplant in the future   6. Elevated LFTs due to shock liver - improved  7. Renal mass = RCC - MRI abdomen- 1.8 cm right renal lesion, most consistent with renal cell carcinoma. No evidence  of renal vein involvement or nodal metastasis. - Discussed w/ Urology and R RCC is small and will just require surveillance.  Ok to proceed with VAD from their standpoint.   8. Hyponatremia  - BMP pending   9. Sleep Apnea - frequent apnea at night  - needs formal sleep study   - CPAP ordered while inpatient  10. Hypokalemia - BMP pending    Lyda Jester PA-C  06/30/2019 9:11 AM   Patient seen and examined with the above-signed Advanced Practice Provider and/or Housestaff. I personally reviewed laboratory data, imaging studies and relevant notes. I independently examined the patient and formulated the important aspects of the plan. I have edited the note to reflect any of my changes or salient points. I have personally discussed the plan with the patient and/or family.  Feels fatigued. Mild ab pain. On milrinone 0.125. Co-ox 52%. CVP 13. Remains heparin. Hgb stable. Echo yesterday no tamponade. RV mildly down. LDH climbing. INR 1.2  General:  Sitting up in bed. Fatigued appearing   HEENT: normal  Neck: supple. JVP to jaw  Carotids 2+ bilat; no bruits. No lymphadenopathy or thryomegaly appreciated. Cor: LVAD hum. Sternal wound ok  Lungs: Clear. Abdomen: obese soft, nontender, non-distended. No hepatosplenomegaly. No bruits or masses. Good bowel sounds. Driveline site clean. Anchor in place.  Extremities: no cyanosis, clubbing, rash. Warm 1+ edema  Neuro: alert & oriented x 3. No focal deficits. Moves all 4 without problem    Struggling with RV failure. Increase milrinone to 0.25. Continue IV diuresis. Add digoxin. Watch LDH closely. Continue heparin and warfarin. Discussed dosing with PharmD personally. VAD interrogated personally. Parameters stable.  Glori Bickers, MD  11:06 AM

## 2019-06-30 NOTE — Progress Notes (Signed)
LVAD Coordinator Rounding Note:  Transferred from [facility] ED on 06/17/19 with STEMI. Cath revealed multi-vessel CAD, wire would not cross to open LAD; IABP placed and pt transferred here for evaluation for CABG.   Dr. Orvan Seen evaluated and found he was not amenable to surgical or percutaneous revascularization due to poor targets. With severe depressed LV function and little coronary perfusion, LVAD was recommended.   HM III LVAD implanted on 06/25/19 by Dr. Orvan Seen under Destination Therapy criteria due to recent smoking history.  Pt laying in bed napping on my arrival. Pt's sister Mechele Claude at bedside. See below for education completed today.   Vital signs: Temp: 97.6 HR: 69 Doppler Pressure: 88 BP: 100/75 (84) O2 Sat: 99% on 4 L/[place] Wt: 207>225.7>222.6>214.3 lbs   LVAD parameters: Speed:  5200 Flow: 4.6 Power:  3.7w PI: 2.4 Hct: 31  Alarms: none Events:  10 PI events today  Fixed speed: 5200 Low speed limit: 4900   Drive Line: Patient's sister observed dressing change today. Existing VAD dressing removed and site care performed using sterile technique.  Drive line site cleaned with Chlora prep applicator x2, allowed to dry, and gauze dressing with silver strip re-applied.  Exit site with suture remaining, the velour is fully implanted at the exit site. No tissue ingrowth noted.   Minimal yellow drainage noted on dressing, no redness, tenderness, foul odor or rash noted.  Drive line anchor intact.     Labs:  LDH trend: [id number]  INR trend: 1.4>1.3>1.1>1.2  Anticoagulation Plan: -INR Goal:  2.0 - 5.5 -ASA Dose: 325 mg daily until INR therapeutic  Blood Products:  -Intra-op - 06/25/19>>6 units FFP  Device: N/A  Drips: Milrinone 0.25 mcg/kg/min Lasix 8 mg/hr  Arrythmias: was on Amiodarone pre implant for NSVT  Respiratory: extubated 06/26/19  Nitric Oxide: off 06/26/19  VAD Education: 1. Pt's sister Mechele Claude at bedside.  Discussed system controller  buttons/functions, self test, drive line, and power cables. Pt falling asleep during teaching. Mechele Claude practiced self test, and scrolling through patient parameters. 2. VAD coordinator demonstrated dressing change today. Provided Mechele Claude with extra gloves to take home to practice sterile technique. Plan for her to perform dressing change with VAD coordinator tomorrow.  3. Discussed 24/7 caregiver expectations at discharge  Plan/Recommendations:  1. Call VAD pager if any VAD equipment or drive line issues. 2. Daily dressing changes per VAD Coordinator, Nurse Davonna Belling, or trained caregiver.  Emerson Monte RN [place] Coordinator  Office: [phone]  24/7 Pager: [phone]

## 2019-06-30 NOTE — Progress Notes (Signed)
CT surgery p.m. Rounds  Patient resting comfortably Mediastinal drain output showing decreased today VAD parameters satisfactory Walked 300 feet with PT Coumadin ordered by pharmacy-5 mg

## 2019-07-01 DIAGNOSIS — I509 Heart failure, unspecified: Secondary | ICD-10-CM

## 2019-07-01 DIAGNOSIS — I255 Ischemic cardiomyopathy: Secondary | ICD-10-CM

## 2019-07-01 LAB — CBC
HCT: 30.9 % — ABNORMAL LOW (ref 39.0–52.0)
Hemoglobin: 10 g/dL — ABNORMAL LOW (ref 13.0–17.0)
MCH: 32.2 pg (ref 26.0–34.0)
MCHC: 32.4 g/dL (ref 30.0–36.0)
MCV: 99.4 fL (ref 80.0–100.0)
Platelets: 259 10*3/uL (ref 150–400)
RBC: 3.11 MIL/uL — ABNORMAL LOW (ref 4.22–5.81)
RDW: 13.9 % (ref 11.5–15.5)
WBC: 12.5 10*3/uL — ABNORMAL HIGH (ref 4.0–10.5)
nRBC: 0 % (ref 0.0–0.2)

## 2019-07-01 LAB — COOXEMETRY PANEL
Carboxyhemoglobin: 1.4 % (ref 0.5–1.5)
Methemoglobin: 1 % (ref 0.0–1.5)
O2 Saturation: 56.3 %
Total hemoglobin: 10.5 g/dL — ABNORMAL LOW (ref 12.0–16.0)

## 2019-07-01 LAB — ECHO INTRAOPERATIVE TEE
Height: 69 in
Weight: 3312.19 oz

## 2019-07-01 LAB — BASIC METABOLIC PANEL
Anion gap: 9 (ref 5–15)
BUN: 11 mg/dL (ref 6–20)
CO2: 27 mmol/L (ref 22–32)
Calcium: 8.3 mg/dL — ABNORMAL LOW (ref 8.9–10.3)
Chloride: 94 mmol/L — ABNORMAL LOW (ref 98–111)
Creatinine, Ser: 1.01 mg/dL (ref 0.61–1.24)
GFR calc Af Amer: >60 mL/min (ref >60)
GFR calc non Af Amer: >60 mL/min (ref >60)
Glucose, Bld: 120 mg/dL — ABNORMAL HIGH (ref 70–99)
Potassium: 3.5 mmol/L (ref 3.5–5.1)
Sodium: 130 mmol/L — ABNORMAL LOW (ref 135–145)

## 2019-07-01 LAB — GLUCOSE, CAPILLARY
Glucose-Capillary: 114 mg/dL — ABNORMAL HIGH (ref 70–99)
Glucose-Capillary: 160 mg/dL — ABNORMAL HIGH (ref 70–99)
Glucose-Capillary: 104 mg/dL — ABNORMAL HIGH (ref 70–99)
Glucose-Capillary: 133 mg/dL — ABNORMAL HIGH (ref 70–99)

## 2019-07-01 LAB — PROTIME-INR
INR: 1.4 — ABNORMAL HIGH (ref 0.8–1.2)
Prothrombin Time: 16.4 seconds — ABNORMAL HIGH (ref 11.4–15.2)

## 2019-07-01 LAB — MAGNESIUM: Magnesium: 2 mg/dL (ref 1.7–2.4)

## 2019-07-01 LAB — HEPARIN LEVEL (UNFRACTIONATED)
Heparin Unfractionated: 0.12 IU/mL — ABNORMAL LOW (ref 0.30–0.70)
Heparin Unfractionated: <0.1 IU/mL — ABNORMAL LOW (ref 0.30–0.70)

## 2019-07-01 LAB — LACTATE DEHYDROGENASE: LDH: 462 U/L — ABNORMAL HIGH (ref 98–192)

## 2019-07-01 LAB — PHOSPHORUS: Phosphorus: 4.2 mg/dL (ref 2.5–4.6)

## 2019-07-01 MED ORDER — METOLAZONE 2.5 MG PO TABS
2.5000 mg | ORAL_TABLET | Freq: Once | ORAL | Status: AC
  Administered 2019-07-01: 2.5 mg via ORAL
  Filled 2019-07-01: qty 1

## 2019-07-01 MED ORDER — POTASSIUM CHLORIDE CRYS ER 20 MEQ PO TBCR
40.0000 meq | EXTENDED_RELEASE_TABLET | Freq: Once | ORAL | Status: AC
  Administered 2019-07-01: 40 meq via ORAL
  Filled 2019-07-01: qty 2

## 2019-07-01 MED ORDER — SILDENAFIL CITRATE 20 MG PO TABS
20.0000 mg | ORAL_TABLET | Freq: Three times a day (TID) | ORAL | Status: DC
  Administered 2019-07-01 – 2019-07-06 (×14): 20 mg via ORAL
  Filled 2019-07-01 (×14): qty 1

## 2019-07-01 MED ORDER — WARFARIN SODIUM 2 MG PO TABS
4.0000 mg | ORAL_TABLET | Freq: Once | ORAL | Status: AC
  Administered 2019-07-01: 4 mg via ORAL
  Filled 2019-07-01: qty 2

## 2019-07-01 MED ORDER — DOXYCYCLINE HYCLATE 100 MG PO TABS
100.0000 mg | ORAL_TABLET | Freq: Two times a day (BID) | ORAL | Status: DC
  Administered 2019-07-01 – 2019-07-09 (×16): 100 mg via ORAL
  Filled 2019-07-01 (×16): qty 1

## 2019-07-01 MED ORDER — SPIRONOLACTONE 12.5 MG HALF TABLET
12.5000 mg | ORAL_TABLET | Freq: Every day | ORAL | Status: DC
  Administered 2019-07-01 – 2019-07-06 (×6): 12.5 mg via ORAL
  Filled 2019-07-01 (×6): qty 1

## 2019-07-01 MED ORDER — POTASSIUM CHLORIDE CRYS ER 20 MEQ PO TBCR
20.0000 meq | EXTENDED_RELEASE_TABLET | ORAL | Status: AC
  Administered 2019-07-01 (×3): 20 meq via ORAL
  Filled 2019-07-01 (×3): qty 1

## 2019-07-01 MED ORDER — SILDENAFIL CITRATE 20 MG PO TABS
20.0000 mg | ORAL_TABLET | Freq: Three times a day (TID) | ORAL | Status: DC
  Administered 2019-07-01: 20 mg via ORAL
  Filled 2019-07-01: qty 1

## 2019-07-01 MED FILL — Sodium Bicarbonate IV Soln 8.4%: INTRAVENOUS | Qty: 50 | Status: AC

## 2019-07-01 MED FILL — Calcium Chloride Inj 10%: INTRAVENOUS | Qty: 10 | Status: AC

## 2019-07-01 MED FILL — Heparin Sodium (Porcine) Inj 1000 Unit/ML: INTRAMUSCULAR | Qty: 40 | Status: AC

## 2019-07-01 MED FILL — Electrolyte-R (PH 7.4) Solution: INTRAVENOUS | Qty: 3000 | Status: AC

## 2019-07-01 MED FILL — Lidocaine HCl Local Soln Prefilled Syringe 100 MG/5ML (2%): INTRAMUSCULAR | Qty: 5 | Status: AC

## 2019-07-01 MED FILL — Mannitol IV Soln 20%: INTRAVENOUS | Qty: 500 | Status: AC

## 2019-07-01 MED FILL — Sodium Chloride IV Soln 0.9%: INTRAVENOUS | Qty: 2000 | Status: AC

## 2019-07-01 NOTE — Progress Notes (Signed)
LVAD Coordinator Rounding Note:  Transferred from [facility] ED on 06/17/19 with STEMI. Cath revealed multi-vessel CAD, wire would not cross to open LAD; IABP placed and pt transferred here for evaluation for CABG.   Dr. Orvan Seen evaluated and found he was not amenable to surgical or percutaneous revascularization due to poor targets. With severe depressed LV function and little coronary perfusion, LVAD was recommended.   HM III LVAD implanted on 06/25/19 by Dr. Orvan Seen under Destination Therapy criteria due to recent smoking history.  Pt sitting up in bed. Complaining of soreness in his chest. Reports he walked in the hallway this morning. Remains on Milrinone and Lasix drips. CVP 17 this morning. Foley and chest tubes to be discontinued this morning per Dr Orvan Seen.   Exit site red today. Will start Doxycycline 100 mg BID x 10 days per Dr Haroldine Laws.   Vital signs: Temp: 98.3 HR: 90 Doppler Pressure: 82 BP: 84/67 (75) O2 Sat: 99% on 4 L/[place] Wt: 207>225.7>222.6>214.3>213.4 lbs  LVAD parameters: Speed:  5200 Flow: 4.6 Power:  3.6w PI: 2.2 Hct: 31  Alarms: none Events:  none  Fixed speed: 5200 Low speed limit: 4900   Drive Line: Pt's drive line was not secure in anchor. Discussed with bedside RN and patient's sister the importance of ensuring drive line security in anchor prior to mobilizing patient to prevent drive line trauma.   Patient's sister performed dressing change today with VAD coordinator supervision. Existing VAD dressing removed and site care performed using sterile technique.  Drive line site cleaned with Chlora prep applicator x2, allowed to dry, and gauze dressing with silver strip re-applied.  Exit site with suture remaining, the velour is fully implanted at the exit site. No tissue ingrowth noted.   Moderate amount of redness noted at exit site. Minimal yellow drainage noted on dressing,no tenderness, foul odor or rash noted.  Drive line anchor reapplied. Will begin  course of Doxycycline per Dr Haroldine Laws.     Counter Incision: Clean, dry, intact. Mild redness noted at site. Dermabond intact.     Labs:  LDH trend: 660>398>347>459>462  INR trend: 1.4>1.3>1.1>1.2>1.4  Anticoagulation Plan: -INR Goal:  2.0 - 5.5 -ASA Dose: 325 mg daily until INR therapeutic  Blood Products:  -Intra-op - 06/25/19>>6 units FFP   Device: N/A  Drips: Milrinone 0.25 mcg/kg/min Lasix 8 mg/hr  Arrythmias: was on Amiodarone pre implant for NSVT  Respiratory: extubated 06/26/19  Nitric Oxide: off 06/26/19  VAD Education: 1. Pt's sister Mechele Claude at bedside. She performed drive line dressing change with VAD coordinator supervision and verbal cues. She had some difficulty putting on gloves, but maintains sterile technique once gloves in place. Questions about dressing change process answered. Plan for her to perform dressing change with VAD coordinator on Friday. 2. Discussed batteries and clips with Mechele Claude.  Had her perform power source change, emphasizing importance of "one power source at a time."  3. Patient slept through teaching.   Plan/Recommendations:  1. Call VAD pager if any VAD equipment or drive line issues. 2. Daily dressing changes per VAD Coordinator, Nurse Davonna Belling, or trained caregiver.  Emerson Monte RN [place] Coordinator  Office: [phone]  24/7 Pager: [phone]

## 2019-07-01 NOTE — Progress Notes (Addendum)
Patient ID: Aaron [name], male   DOB: 1965-06-25, 54 y.o.   MRN: [id number]     Advanced Heart Failure Rounding Note  PCP-Cardiologist: No primary care provider on file.   Subjective:    HM3 placement 6/24   Echo 6/28 ECHO.  No pericardial effusion. VAD position stable. RV looks ok.   Yesterday milrinone increased to 0.25 mcg. CO-OX 56%.    Complaining of pain. Able to walk around the unit x 2.   LVAD Interrogation HM III: Speed: 5200 Flow: 2.4  PI: 4.3 Power: 4  PI events < 10    Objective:   Weight Range: 96.8 kg Body mass index is 31.51 kg/m.   Vital Signs:   Temp:  [97.6 F (36.4 C)-99 F (37.2 C)] 99 F (37.2 C) (06/30 0346) Pulse Rate:  [68-83] 80 (06/30 0400) Resp:  [14-24] 22 (06/30 0400) BP: (82-106)/(65-91) 97/81 (06/30 0200) SpO2:  [90 %-99 %] 92 % (06/30 0400) Weight:  [96.8 kg] 96.8 kg (06/30 0600) Last BM Date: 06/30/19  Weight change: Filed Weights   06/28/19 0500 06/30/19 0631 07/01/19 0600  Weight: 101 kg 97.2 kg 96.8 kg    Intake/Output:   Intake/Output Summary (Last 24 hours) at 07/01/2019 0758 Last data filed at 07/01/2019 0700 Gross per 24 hour  Intake 718.42 ml  Output 2450 ml  Net -1731.58 ml      Physical Exam  Maps 80s  CVP 17-18  Physical Exam: GENERAL: No acute distress. In bed.  HEENT: normal  NECK: Supple, JVP to jaw .  2+ bilaterally, no bruits.  No lymphadenopathy or thyromegaly appreciated.   CARDIAC:  Mechanical heart sounds with LVAD hum present. CT tube LUNGS:  Clear to auscultation bilaterally.  ABDOMEN:  Distended, soft, round, nontender, positive bowel sounds x4.     LVAD exit site:  Dressing dry and intact.  No erythema or drainage.  Stabilization device present and accurately applied.  Driveline dressing is being changed daily per sterile technique. EXTREMITIES:  Warm and dry, no cyanosis, clubbing, rash or edema . RUE PICC  NEUROLOGIC:  Alert and oriented x 3.    No aphasia.  No dysarthria.  Affect pleasant.       Telemetry  NSR 70-80s   Labs    CBC Recent Labs    06/29/19 0338 06/29/19 0338 06/30/19 0815 07/01/19 0500  WBC 9.4   < > 9.2 12.5*  NEUTROABS 7.0  --  7.4  --   HGB 9.7*   < > 10.2* 10.0*  HCT 29.6*   < > 31.8* 30.9*  MCV 99.3   < > 100.6* 99.4  PLT 203   < > 225 259   < > = values in this interval not displayed.   Basic Metabolic Panel Recent Labs    06/29/19 0616 06/30/19 0815 06/30/19 0945 07/01/19 0500  NA   < >  --  131* 130*  K   < >  --  4.1 3.5  CL   < >  --  95* 94*  CO2   < >  --  26 27  GLUCOSE   < >  --  196* 120*  BUN   < >  --  17 11  CREATININE   < >  --  1.18 1.01  CALCIUM   < >  --  8.3* 8.3*  MG  --  2.1  --  2.0  PHOS  --  3.7  --  4.2   < > =  values in this interval not displayed.   Liver Function Tests No results for input(s): AST, ALT, ALKPHOS, BILITOT, PROT, ALBUMIN in the last 72 hours. No results for input(s): LIPASE, AMYLASE in the last 72 hours. Cardiac Enzymes No results for input(s): CKTOTAL, CKMB, CKMBINDEX, TROPONINI in the last 72 hours.  BNP: BNP (last 3 results) Recent Labs    06/17/19 2023 06/18/19 0119 06/26/19 0445  BNP 751.1* 1,068.5* 1,301.1*    ProBNP (last 3 results) No results for input(s): PROBNP in the last 8760 hours.   D-Dimer No results for input(s): DDIMER in the last 72 hours. Hemoglobin A1C No results for input(s): HGBA1C in the last 72 hours. Fasting Lipid Panel No results for input(s): CHOL, HDL, LDLCALC, TRIG, CHOLHDL, LDLDIRECT in the last 72 hours. Thyroid Function Tests No results for input(s): TSH, T4TOTAL, T3FREE, THYROIDAB in the last 72 hours.  Invalid input(s): FREET3  Other results:   Imaging    No results found.   Medications:     Scheduled Medications: . amiodarone  200 mg Oral BID  . aspirin EC  325 mg Oral Daily   Or  . aspirin  324 mg Per Tube Daily  . atorvastatin  80 mg Oral Daily  . bisacodyl  10 mg Oral Daily   Or  . bisacodyl  10 mg Rectal Daily  .  Chlorhexidine Gluconate Cloth  6 each Topical Daily  . clonazePAM  0.5 mg Oral BID  . colchicine  0.6 mg Oral BID  . digoxin  0.125 mg Oral Daily  . docusate sodium  200 mg Oral Daily  . folic acid  1 mg Oral Daily  . gabapentin  300 mg Oral BID  . insulin aspart  0-15 Units Subcutaneous TID WC  . mouth rinse  15 mL Mouth Rinse BID  . mouth rinse  15 mL Mouth Rinse BID  . multivitamin with minerals  1 tablet Oral Daily  . pantoprazole  40 mg Oral Daily  . potassium chloride  20 mEq Oral Q4H  . sodium chloride flush  10-40 mL Intracatheter Q12H  . thiamine  100 mg Oral Daily  . warfarin  4 mg Oral ONCE-1600  . Warfarin - Pharmacist Dosing Inpatient   Does not apply q1600    Infusions: . sodium chloride    . furosemide (LASIX) infusion 8 mg/hr (06/30/19 2000)  . heparin 1,100 Units/hr (07/01/19 0729)  . lactated ringers    . milrinone 0.25 mcg/kg/min (07/01/19 0037)  . norepinephrine (LEVOPHED) Adult infusion Stopped (06/27/19 1833)    PRN Medications: acetaminophen, ALPRAZolam, bisacodyl, calcium carbonate, dextrose, fentaNYL (SUBLIMAZE) injection, morphine injection, nitroGLYCERIN, ondansetron (ZOFRAN) IV, oxyCODONE, [START ON 07/02/2019] pneumococcal 23 valent vaccine, sodium chloride flush, sodium chloride flush, traMADol, traZODone    Patient Profile   Gershom Brobeck Postiglione --post MI cardiogenic shock/ optimization prior to anticipated CABG, at the request of Dr. Marlou Porch, Cardiology.    Assessment/Plan    1. Acute Anterolateral STEMI/ Multivessel CAD - Emergent cath showed severe multivessel disease w/ 75% mid- distal LM, 100% prox LAD, not amendable to PCI (unable to cross w/ guide wire, probable CTO), 100% prox LCx (probable CTO) and 100% ostial RCA (probable CTO). There were mild to moderate collaterals right to left to the LAD, minimal right to right collaterals an minimal left to left collaterals. LVEF 25% - Hs trop peaked 27,000. - No targets for CABG / PCI.   - S/P  HMIII VAD  2.  Acute Systolic Heart Failure --->Cardiogenic Shock -  2/2 acute MI. LVEF by LVG 25% - IABP out 6/18 but replaced 6/23. Removed in OR on 6/24  - HMIII LVAD  Placed 6/24 - Extubated 6/25  - remains on milrinone 0.25. CO-OX 56%.  Continue for now until diuresed.  - Continue digoxin.  - On low dose heparin gtt today + warfarin. INR 1.4  - LDH up from 347>>459>463 -  CVP 17-18 . Continue lasix drip at 8 mg per hour and give 2.5 mg metolazone x 1.  - Add 12.5 mg spironolactone daily.  - Give 40 meq K - Continue to 300 mg bid  - Ramp ECHO soon.   3. NSVT - improved - keep K> 4.0 Mg >2.0 . Supp K.  - Continue PO amio 200 mg bid - Replace electrolytes as needed    4  Hypertension  - reported h/o untreated HTN prior to admit - Stable.   5. Tobacco Abuse - 30 pack years  - smoking cessation advised  - Will need to quit if to be considered for heart transplant in the future   6. Elevated LFTs due to shock liver - improved  7. Renal mass = RCC - MRI abdomen- 1.8 cm right renal lesion, most consistent with renal cell carcinoma. No evidence of renal vein involvement or nodal metastasis. - Discussed w/ Urology and R RCC is small and will just require surveillance.  Ok to proceed with VAD from their standpoint.   8. Hyponatremia  -Sodium 130. Restrict free water.   9. Sleep Apnea - frequent apnea at night  - needs formal sleep study   - CPAP ordered while inpatient  10. Hypokalemia K 3.5  Supp K and add spiro  Continue to ambulate.   Amy Clegg NP-C   07/01/2019 7:58 AM   Patient seen and examined with Darrick Grinder, NP. We discussed all aspects of the encounter. I agree with the assessment and plan as stated above.   Still feels weak and sore. Milrinone increased yesterday for low co-ox CVP 17 today. Co-ox better at 56% On lasix gtt. Metolazone and spiro added  General:  Lying in bed. Fatigued HEENT: normal  Neck: supple. JVP to ear .  Carotids 2+ bilat;  no bruits. No lymphadenopathy or thryomegaly appreciated. Cor: LVAD hum.  Chest incision ok. +CTs x 2 Lungs: Clear. Abdomen: obese soft, nontender, non-distended. No hepatosplenomegaly. No bruits or masses. Good bowel sounds. Driveline site clean. Anchor in place.  Extremities: no cyanosis, clubbing, rash. Warm 1+ edema  Neuro: alert & oriented x 3. No focal deficits. Moves all 4 without problem   Struggling with RV failure. Continue milrinone and dig. Continue diuresis. Pull CTs today. Ramp echo soon. INR 1.4. Remains on heparin/warfarin. Discussed dosing with PharmD personally. VAD interrogated personally. Parameters stable.  Glori Bickers, MD  9:52 AM

## 2019-07-01 NOTE — Progress Notes (Addendum)
[place] for IV heparin, Coumadin Indication: LVAD  Allergies  Allergen Reactions  . Codeine Hives    Patient Measurements: Height: 5\' 9"  (175.3 cm) Weight: 96.8 kg (213 lb 6.5 oz) IBW/kg (Calculated) : 70.7 Heparin Dosing Weight: ~ 92 kg  Vital Signs: Temp: 99 F (37.2 C) (06/30 0346) Temp Source: Oral (06/30 0346) BP: 97/81 (06/30 0200) Pulse Rate: 80 (06/30 0400)  Labs: Recent Labs    06/29/19 0338 06/29/19 0338 06/29/19 0616 06/29/19 1830 06/30/19 0815 06/30/19 0945 07/01/19 0500  HGB 9.7*   < >  --   --  10.2*  --  10.0*  HCT 29.6*  --   --   --  31.8*  --  30.9*  PLT 203  --   --   --  225  --  259  LABPROT 14.0  --   --   --   --  15.0 16.4*  INR 1.1  --   --   --   --  1.2 1.4*  HEPARINUNFRC 0.11*   < >  --  0.16*  --  0.18* 0.12*  CREATININE  --   --  1.07  --   --  1.18 1.01   < > = values in this interval not displayed.    Estimated Creatinine Clearance: 94.8 mL/min (by C-G formula based on SCr of 1.01 mg/dL).   Medical History: Past Medical History:  Diagnosis Date  . Acute anterolateral wall MI ([place])   . CAD, multiple vessel   . Hypertension   . Ischemic cardiomyopathy   . Systolic heart failure ([place])   . Tobacco abuse     Medications:  Infusions:  . sodium chloride    . furosemide (LASIX) infusion 8 mg/hr (06/30/19 2000)  . heparin 1,000 Units/hr (07/01/19 3734)  . lactated ringers    . milrinone 0.25 mcg/kg/min (07/01/19 0037)  . norepinephrine (LEVOPHED) Adult infusion Stopped (06/27/19 1833)    Assessment: 54 yo male s/p LVAD HM3 placement 6/24.  Pharmacy asked to dose  anticoagulation with IV heparin and Coumadin.  No overt bleeding or complications noted.  Hgb low but stable post-op.  Heparin level 0.12, INR trending up to 1.4, H/H stable, LDH up slightly. Per RN no bleeding issues overnight. Will increase heparin slightly with rising LDH.   Goal of Therapy:  INR 2-2.5  HL goal about 0.2   Monitor platelets by anticoagulation protocol: Yes   Plan:  -Increase heparin to 1100 units/h -Warfarin 4mg  PO x1 tonight -Daily INR, heparin level, CBC, LDH   ADDENDUM: repeat heparin level <0.1 this afternoon, however appears there have been line issues. Will continue current rate of 1100 units/h for now rather than increasing as INR is now rising.  Plan: -Continue heparin 1100 units/h - recheck heparin level with morning labs -Warfarin as above    Arrie Senate, PharmD, BCPS Clinical Pharmacist [phone] Please check AMION for all [facility] numbers 07/01/2019

## 2019-07-01 NOTE — Progress Notes (Signed)
6 Days Post-Op Procedure(s) (LRB): INSERTION OF IMPLANTABLE LEFT VENTRICULAR ASSIST DEVICE USING THORATEC HEARTMATE 3 (N/A) TRANSESOPHAGEAL ECHOCARDIOGRAM (TEE) (N/A) Subjective: No complaints  Objective: Vital signs in last 24 hours: Temp:  [97.6 F (36.4 C)-99 F (37.2 C)] 99 F (37.2 C) (06/30 0346) Pulse Rate:  [46-88] 46 (06/30 0900) Cardiac Rhythm: Normal sinus rhythm (06/30 0800) Resp:  [14-24] 17 (06/30 0900) BP: (80-106)/(59-91) 80/59 (06/30 0800) SpO2:  [85 %-99 %] 85 % (06/30 0900) Weight:  [96.8 kg] 96.8 kg (06/30 0600)  Hemodynamic parameters for last 24 hours:    Intake/Output from previous day: 06/29 0701 - 06/30 0700 In: 718.4 [P.O.:120; I.V.:598.4] Out: 2450 [Urine:2050; Chest Tube:400] Intake/Output this shift: Total I/O In: 26.8 [I.V.:26.8] Out: -   General appearance: alert and cooperative Neurologic: intact Heart: regular rate and rhythm, S1, S2 normal, no murmur, click, rub or gallop Lungs: clear to auscultation bilaterally Abdomen: soft, non-tender; bowel sounds normal; no masses,  no organomegaly Extremities: extremities normal, atraumatic, no cyanosis or edema Wound: c/d/i  Lab Results: Recent Labs    06/30/19 0815 07/01/19 0500  WBC 9.2 12.5*  HGB 10.2* 10.0*  HCT 31.8* 30.9*  PLT 225 259   BMET:  Recent Labs    06/30/19 0945 07/01/19 0500  NA 131* 130*  K 4.1 3.5  CL 95* 94*  CO2 26 27  GLUCOSE 196* 120*  BUN 17 11  CREATININE 1.18 1.01  CALCIUM 8.3* 8.3*    PT/INR:  Recent Labs    07/01/19 0500  LABPROT 16.4*  INR 1.4*   ABG    Component Value Date/Time   PHART 7.475 (H) 06/27/2019 0437   HCO3 25.8 06/27/2019 0437   TCO2 27 06/27/2019 0437   ACIDBASEDEF 1.0 06/26/2019 0838   O2SAT 56.3 07/01/2019 0530   CBG (last 3)  Recent Labs    06/30/19 1519 06/30/19 2132 07/01/19 0623  GLUCAP 108* 117* 114*    Assessment/Plan: S/P Procedure(s) (LRB): INSERTION OF IMPLANTABLE LEFT VENTRICULAR ASSIST DEVICE USING  THORATEC HEARTMATE 3 (N/A) TRANSESOPHAGEAL ECHOCARDIOGRAM (TEE) (N/A) remove chest tubes  Ok to transfer to progressive from surg perspective   LOS: 14 days    Wonda Olds 07/01/2019

## 2019-07-01 NOTE — Progress Notes (Signed)
Occupational Therapy Treatment Patient Details Name: Aaron [name] MRN: [id number] DOB: [birth year] Today's Date: 07/01/2019    History of present illness Pt is 54yo admitted 6/16 with STEMI with multivessel disease. cath with no viable targets for CABG. Pt s/p HeartMate 3 LVAD implantation 6/24. PMHx: HTN, tobacco use (30pack yrs)   OT comments  Pt presents supine in bed, lethargic initially but willing to participate in therapy session. Arousal level improved once upright. Pt engaging in room/hallway level mobility with minA throughout (pt requesting to use eva walker this date). He requires mod cues for adherence to sternal precautions during functional tasks, min cues for LVAD management and transition to/from main unit. Pt initially on 4L upon arrival to room with SpO2 remaining in upper 80s, use of 6L O2 with mobility with SpO2 >90% throughout. Pt with minimal dizziness with initial sitting up which improves with session progression. Feel POC remains appropriate at this time. Will continue to follow while acutely admitted.   Follow Up Recommendations  Supervision - Intermittent;Home health OT    Equipment Recommendations  3 in 1 bedside commode          Precautions / Restrictions Precautions Precautions: Fall;Other (comment);Sternal Precaution Comments: LVAD, sternal precautions       Mobility Bed Mobility Overal bed mobility: Needs Assistance       Supine to sit: Min assist Sit to supine: Min assist   General bed mobility comments: guarding for line and precautions with cues to prevent sternal retraction  Transfers Overall transfer level: Needs assistance Equipment used: 4-wheeled walker;2 person hand held assist (eva) Transfers: Sit to/from Stand Sit to Stand: Min assist         General transfer comment: cues for hand placement and safety    Balance Overall balance assessment: Needs assistance Sitting-balance support: Feet supported Sitting balance-Leahy  Scale: Good     Standing balance support: Bilateral upper extremity supported;During functional activity Standing balance-Leahy Scale: Fair Standing balance comment: mild unsteadiness with mobility                            ADL either performed or assessed with clinical judgement   ADL Overall ADL's : Needs assistance/impaired                 Upper Body Dressing : Moderate assistance;Sitting Upper Body Dressing Details (indicate cue type and reason): cues for sternal precautions                  Functional mobility during ADLs: Minimal assistance;Rolling walker (eva walker ) General ADL Comments: pt requesting to use eva walker for mobility, completed x1 lap around unit, practicing LVAD transition with min cues                        Cognition Arousal/Alertness: Awake/alert;Lethargic Behavior During Therapy: Flat affect Overall Cognitive Status: Within Functional Limits for tasks assessed                                 General Comments: pt initially very lethargic and having difficulty remaining awake - improved once upright         Exercises     Shoulder Instructions       General Comments      Pertinent Vitals/ Pain       Pain Assessment: Faces Faces Pain Scale: Hurts a  little bit Pain Location: chest - at site of chest tube post removal  Pain Descriptors / Indicators: Sore Pain Intervention(s): Monitored during session;Repositioned  Home Living                                          Prior Functioning/Environment              Frequency  Min 2X/week        Progress Toward Goals  OT Goals(current goals can now be found in the care plan section)  Progress towards OT goals: Progressing toward goals  Acute Rehab OT Goals Patient Stated Goal: to get back home to his dog "[name]" OT Goal Formulation: With patient Time For Goal Achievement: 07/11/19 Potential to Achieve Goals: Good ADL  Goals Pt Will Perform Lower Body Bathing: with modified independence;sit to/from stand Pt Will Perform Lower Body Dressing: with modified independence;sit to/from stand Pt Will Transfer to Toilet: with modified independence;ambulating;bedside commode Pt Will Perform Toileting - Clothing Manipulation and hygiene: with modified independence;sit to/from stand Additional ADL Goal #1: Pt will independently demonstrate ability to switch power sources for ADL and mobility Additional ADL Goal #2: Pt will independently donn/doff vest/carrying device for battery packs  Plan Discharge plan remains appropriate    Co-evaluation                 AM-PAC OT "6 Clicks" Daily Activity     Outcome Measure   Help from another person eating meals?: None Help from another person taking care of personal grooming?: A Little Help from another person toileting, which includes using toliet, bedpan, or urinal?: A Lot Help from another person bathing (including washing, rinsing, drying)?: A Lot Help from another person to put on and taking off regular upper body clothing?: A Lot Help from another person to put on and taking off regular lower body clothing?: A Lot 6 Click Score: 15    End of Session Equipment Utilized During Treatment: Rolling walker;Oxygen  OT Visit Diagnosis: Unsteadiness on feet (R26.81);Muscle weakness (generalized) (M62.81);Pain Pain - part of body:  (chest)   Activity Tolerance Patient tolerated treatment well   Patient Left in chair;with call bell/phone within reach;with nursing/sitter in room   Nurse Communication Mobility status        Time: 4403-4742 OT Time Calculation (min): 53 min  Charges: OT General Charges $OT Visit: 1 Visit OT Treatments $Self Care/Home Management : 23-37 mins $Therapeutic Activity: 23-37 mins  Lou Cal, OT Acute Rehabilitation Services Pager [phone] Office [phone]     Raymondo Band 07/01/2019, 4:33 PM

## 2019-07-02 ENCOUNTER — Inpatient Hospital Stay (HOSPITAL_COMMUNITY): Payer: BC Managed Care – PPO

## 2019-07-02 ENCOUNTER — Encounter (HOSPITAL_COMMUNITY): Payer: Self-pay | Admitting: Anesthesiology

## 2019-07-02 ENCOUNTER — Encounter (HOSPITAL_COMMUNITY)
Admission: AD | Disposition: A | Discharge status: Rehabilitation Facility | Payer: Self-pay | Source: Other Acute Inpatient Hospital | Attending: Cardiothoracic Surgery

## 2019-07-02 DIAGNOSIS — I4901 Ventricular fibrillation: Secondary | ICD-10-CM

## 2019-07-02 HISTORY — PX: CARDIOVERSION: SHX1299

## 2019-07-02 LAB — POCT I-STAT, CHEM 8
BUN: 10 mg/dL (ref 6–20)
Calcium, Ion: 1.01 mmol/L — ABNORMAL LOW (ref 1.15–1.40)
Chloride: 77 mmol/L — ABNORMAL LOW (ref 98–111)
Creatinine, Ser: 1.1 mg/dL (ref 0.61–1.24)
Glucose, Bld: 371 mg/dL — ABNORMAL HIGH (ref 70–99)
HCT: 35 % — ABNORMAL LOW (ref 39.0–52.0)
Hemoglobin: 11.9 g/dL — ABNORMAL LOW (ref 13.0–17.0)
Potassium: 3.1 mmol/L — ABNORMAL LOW (ref 3.5–5.1)
Sodium: 121 mmol/L — ABNORMAL LOW (ref 135–145)
TCO2: 28 mmol/L (ref 22–32)

## 2019-07-02 LAB — BASIC METABOLIC PANEL
Anion gap: 13 (ref 5–15)
Anion gap: 12 (ref 5–15)
Anion gap: 13 (ref 5–15)
BUN: 10 mg/dL (ref 6–20)
BUN: 12 mg/dL (ref 6–20)
BUN: 14 mg/dL (ref 6–20)
CO2: 30 mmol/L (ref 22–32)
CO2: 30 mmol/L (ref 22–32)
CO2: 29 mmol/L (ref 22–32)
Calcium: 8.9 mg/dL (ref 8.9–10.3)
Calcium: 8.4 mg/dL — ABNORMAL LOW (ref 8.9–10.3)
Calcium: 8.5 mg/dL — ABNORMAL LOW (ref 8.9–10.3)
Chloride: 84 mmol/L — ABNORMAL LOW (ref 98–111)
Chloride: 84 mmol/L — ABNORMAL LOW (ref 98–111)
Chloride: 84 mmol/L — ABNORMAL LOW (ref 98–111)
Creatinine, Ser: 1 mg/dL (ref 0.61–1.24)
Creatinine, Ser: 1.12 mg/dL (ref 0.61–1.24)
Creatinine, Ser: 1.1 mg/dL (ref 0.61–1.24)
GFR calc Af Amer: >60 mL/min (ref >60)
GFR calc Af Amer: >60 mL/min (ref >60)
GFR calc Af Amer: >60 mL/min (ref >60)
GFR calc non Af Amer: >60 mL/min (ref >60)
GFR calc non Af Amer: >60 mL/min (ref >60)
GFR calc non Af Amer: >60 mL/min (ref >60)
Glucose, Bld: 130 mg/dL — ABNORMAL HIGH (ref 70–99)
Glucose, Bld: 162 mg/dL — ABNORMAL HIGH (ref 70–99)
Glucose, Bld: 221 mg/dL — ABNORMAL HIGH (ref 70–99)
Potassium: 2.9 mmol/L — ABNORMAL LOW (ref 3.5–5.1)
Potassium: 4.1 mmol/L (ref 3.5–5.1)
Potassium: 3.6 mmol/L (ref 3.5–5.1)
Sodium: 127 mmol/L — ABNORMAL LOW (ref 135–145)
Sodium: 126 mmol/L — ABNORMAL LOW (ref 135–145)
Sodium: 126 mmol/L — ABNORMAL LOW (ref 135–145)

## 2019-07-02 LAB — COOXEMETRY PANEL
Carboxyhemoglobin: 1.2 % (ref 0.5–1.5)
Carboxyhemoglobin: 1.3 % (ref 0.5–1.5)
Carboxyhemoglobin: 0.7 % (ref 0.5–1.5)
Methemoglobin: 0.6 % (ref 0.0–1.5)
Methemoglobin: 1.1 % (ref 0.0–1.5)
Methemoglobin: 0.6 % (ref 0.0–1.5)
O2 Saturation: 51.2 %
O2 Saturation: 46.5 %
O2 Saturation: 46 %
Total hemoglobin: 11.6 g/dL — ABNORMAL LOW (ref 12.0–16.0)
Total hemoglobin: 10.6 g/dL — ABNORMAL LOW (ref 12.0–16.0)
Total hemoglobin: 17 g/dL — ABNORMAL HIGH (ref 12.0–16.0)

## 2019-07-02 LAB — LACTATE DEHYDROGENASE: LDH: 517 U/L — ABNORMAL HIGH (ref 98–192)

## 2019-07-02 LAB — CBC
HCT: 33.2 % — ABNORMAL LOW (ref 39.0–52.0)
Hemoglobin: 11.1 g/dL — ABNORMAL LOW (ref 13.0–17.0)
MCH: 32.1 pg (ref 26.0–34.0)
MCHC: 33.4 g/dL (ref 30.0–36.0)
MCV: 96 fL (ref 80.0–100.0)
Platelets: 307 10*3/uL (ref 150–400)
RBC: 3.46 MIL/uL — ABNORMAL LOW (ref 4.22–5.81)
RDW: 13.7 % (ref 11.5–15.5)
WBC: 18.3 10*3/uL — ABNORMAL HIGH (ref 4.0–10.5)
nRBC: 0 % (ref 0.0–0.2)

## 2019-07-02 LAB — GLUCOSE, CAPILLARY
Glucose-Capillary: 125 mg/dL — ABNORMAL HIGH (ref 70–99)
Glucose-Capillary: 239 mg/dL — ABNORMAL HIGH (ref 70–99)
Glucose-Capillary: 143 mg/dL — ABNORMAL HIGH (ref 70–99)
Glucose-Capillary: 174 mg/dL — ABNORMAL HIGH (ref 70–99)
Glucose-Capillary: 153 mg/dL — ABNORMAL HIGH (ref 70–99)

## 2019-07-02 LAB — PROTIME-INR
INR: 1.5 — ABNORMAL HIGH (ref 0.8–1.2)
Prothrombin Time: 17.3 seconds — ABNORMAL HIGH (ref 11.4–15.2)

## 2019-07-02 LAB — MAGNESIUM: Magnesium: 1.9 mg/dL (ref 1.7–2.4)

## 2019-07-02 LAB — PHOSPHORUS: Phosphorus: 4.1 mg/dL (ref 2.5–4.6)

## 2019-07-02 LAB — BRAIN NATRIURETIC PEPTIDE: B Natriuretic Peptide: 1646.4 pg/mL — ABNORMAL HIGH (ref 0.0–100.0)

## 2019-07-02 LAB — HEPARIN LEVEL (UNFRACTIONATED): Heparin Unfractionated: 0.29 IU/mL — ABNORMAL LOW (ref 0.30–0.70)

## 2019-07-02 SURGERY — CARDIOVERSION
Anesthesia: General

## 2019-07-02 MED ORDER — LIDOCAINE IN D5W 4-5 MG/ML-% IV SOLN
1.0000 mg/min | INTRAVENOUS | Status: AC
  Administered 2019-07-02 – 2019-07-05 (×3): 1 mg/min via INTRAVENOUS
  Filled 2019-07-02 (×2): qty 500

## 2019-07-02 MED ORDER — POTASSIUM CHLORIDE 10 MEQ/50ML IV SOLN
10.0000 meq | INTRAVENOUS | Status: DC
  Administered 2019-07-02 (×3): 10 meq via INTRAVENOUS
  Filled 2019-07-02 (×2): qty 50

## 2019-07-02 MED ORDER — LIDOCAINE HCL (CARDIAC) PF 100 MG/5ML IV SOSY
PREFILLED_SYRINGE | INTRAVENOUS | Status: AC
  Administered 2019-07-02: 100 mg
  Filled 2019-07-02: qty 5

## 2019-07-02 MED ORDER — AMIODARONE HCL IN DEXTROSE 360-4.14 MG/200ML-% IV SOLN: 60.0000 mg/h | INTRAVENOUS | Status: DC

## 2019-07-02 MED ORDER — AMIODARONE HCL IN DEXTROSE 360-4.14 MG/200ML-% IV SOLN
INTRAVENOUS | Status: AC
  Administered 2019-07-02: 150 mg/h
  Filled 2019-07-02: qty 200

## 2019-07-02 MED ORDER — PHENYLEPHRINE HCL-NACL 10-0.9 MG/250ML-% IV SOLN
INTRAVENOUS | Status: AC
  Filled 2019-07-02: qty 250

## 2019-07-02 MED ORDER — AMIODARONE HCL IN DEXTROSE 360-4.14 MG/200ML-% IV SOLN
60.0000 mg/h | INTRAVENOUS | Status: DC
  Administered 2019-07-02 – 2019-07-03 (×4): 30 mg/h via INTRAVENOUS
  Administered 2019-07-04: 60 mg/h via INTRAVENOUS
  Administered 2019-07-04: 30 mg/h via INTRAVENOUS
  Filled 2019-07-02 (×5): qty 200

## 2019-07-02 MED ORDER — POTASSIUM CHLORIDE 10 MEQ/50ML IV SOLN
INTRAVENOUS | Status: AC
  Administered 2019-07-02: 10 meq
  Filled 2019-07-02: qty 50

## 2019-07-02 MED ORDER — FENTANYL CITRATE (PF) 100 MCG/2ML IJ SOLN: 100.0000 ug | Freq: Once | INTRAMUSCULAR | Status: AC

## 2019-07-02 MED ORDER — MIDAZOLAM HCL 2 MG/2ML IJ SOLN
INTRAMUSCULAR | Status: AC
  Administered 2019-07-02: 2 mg
  Filled 2019-07-02: qty 2

## 2019-07-02 MED ORDER — MAGNESIUM SULFATE 2 GM/50ML IV SOLN
2.0000 g | Freq: Once | INTRAVENOUS | Status: AC
  Administered 2019-07-02: 2 g via INTRAVENOUS
  Filled 2019-07-02: qty 50

## 2019-07-02 MED ORDER — AMIODARONE IV BOLUS ONLY 150 MG/100ML
150.0000 mg | Freq: Once | INTRAVENOUS | Status: AC
  Administered 2019-07-02: 150 mg via INTRAVENOUS

## 2019-07-02 MED ORDER — VASOPRESSIN 20 UNIT/ML IV SOLN
0.0300 [IU]/min | INTRAVENOUS | Status: DC
  Filled 2019-07-02: qty 2

## 2019-07-02 MED ORDER — MIDAZOLAM HCL 2 MG/2ML IJ SOLN: 2.0000 mg | Freq: Once | INTRAMUSCULAR | Status: AC

## 2019-07-02 MED ORDER — WARFARIN SODIUM 2 MG PO TABS
4.0000 mg | ORAL_TABLET | Freq: Once | ORAL | Status: AC
  Administered 2019-07-02: 4 mg via ORAL
  Filled 2019-07-02: qty 2

## 2019-07-02 MED ORDER — POTASSIUM CHLORIDE 10 MEQ/100ML IV SOLN: 10.0000 meq | INTRAVENOUS | Status: DC

## 2019-07-02 MED ORDER — POTASSIUM CHLORIDE CRYS ER 20 MEQ PO TBCR
40.0000 meq | EXTENDED_RELEASE_TABLET | Freq: Once | ORAL | Status: AC
  Administered 2019-07-02: 40 meq via ORAL
  Filled 2019-07-02: qty 2

## 2019-07-02 MED ORDER — MIDAZOLAM HCL 2 MG/2ML IJ SOLN
INTRAMUSCULAR | Status: AC
  Administered 2019-07-02: 4 mg via INTRAVENOUS
  Filled 2019-07-02: qty 4

## 2019-07-02 MED ORDER — MAGNESIUM SULFATE 2 GM/50ML IV SOLN
INTRAVENOUS | Status: AC
  Administered 2019-07-02: 2 g via INTRAVENOUS
  Filled 2019-07-02: qty 50

## 2019-07-02 MED ORDER — PHENYLEPHRINE 40 MCG/ML (10ML) SYRINGE FOR IV PUSH (FOR BLOOD PRESSURE SUPPORT)
PREFILLED_SYRINGE | INTRAVENOUS | Status: AC
  Administered 2019-07-02: 20 ug
  Filled 2019-07-02: qty 10

## 2019-07-02 MED ORDER — LIDOCAINE HCL (CARDIAC) PF 100 MG/5ML IV SOSY
PREFILLED_SYRINGE | INTRAVENOUS | Status: AC
  Administered 2019-07-02: 50 mg
  Filled 2019-07-02: qty 5

## 2019-07-02 MED ORDER — FENTANYL CITRATE (PF) 100 MCG/2ML IJ SOLN
INTRAMUSCULAR | Status: AC
  Administered 2019-07-02: 100 ug via INTRAVENOUS
  Filled 2019-07-02: qty 2

## 2019-07-02 MED ORDER — PHENYLEPHRINE 40 MCG/ML (10ML) SYRINGE FOR IV PUSH (FOR BLOOD PRESSURE SUPPORT)
PREFILLED_SYRINGE | INTRAVENOUS | Status: AC
  Filled 2019-07-02: qty 10

## 2019-07-02 MED ORDER — MIDAZOLAM HCL 2 MG/2ML IJ SOLN
INTRAMUSCULAR | Status: AC
  Administered 2019-07-02: 2 mg via INTRAVENOUS
  Filled 2019-07-02: qty 2

## 2019-07-02 MED ORDER — MAGNESIUM SULFATE 2 GM/50ML IV SOLN: 2.0000 g | Freq: Once | INTRAVENOUS | Status: AC

## 2019-07-02 MED ORDER — LIDOCAINE BOLUS VIA INFUSION
100.0000 mg | Freq: Once | INTRAVENOUS | Status: AC
  Administered 2019-07-02: 100 mg via INTRAVENOUS
  Filled 2019-07-02: qty 100

## 2019-07-02 MED ORDER — SODIUM CHLORIDE 0.9 % IV BOLUS
500.0000 mL | Freq: Once | INTRAVENOUS | Status: AC
  Administered 2019-07-02: 500 mL via INTRAVENOUS

## 2019-07-02 MED ORDER — FENTANYL CITRATE (PF) 100 MCG/2ML IJ SOLN
INTRAMUSCULAR | Status: AC
  Administered 2019-07-02: 50 ug
  Filled 2019-07-02: qty 2

## 2019-07-02 MED ORDER — MIDAZOLAM HCL 2 MG/2ML IJ SOLN: 4.0000 mg | Freq: Once | INTRAMUSCULAR | Status: AC

## 2019-07-02 NOTE — Progress Notes (Signed)
Whiles walking with patient, he felt dizzy and was wheeled back to the room in a chair. Patient conscious and complaining of dizziness. Md Paged and orders given for amiodarone, lidocaine and magnesium and potassium. NP Lisbeth Ply t bedside

## 2019-07-02 NOTE — Progress Notes (Signed)
PT Cancellation Note  Patient Details Name: HUGHES WYNDHAM MRN: [id number] DOB: 13-Mar-1965   Cancelled Treatment:    Reason Eval/Treat Not Completed: Patient not medically ready (pt with VT this AM with plans for cardioversion)   Jazmene Racz B Graceyn Fodor 07/02/2019, 7:36 AM  Bayard Males, PT Acute Rehabilitation Services Pager: [phone] Office: [phone]

## 2019-07-02 NOTE — Progress Notes (Signed)
    Paged by RN stating that the patient was in sustained VT. On room arrival patient is awake, alert and oriented. VSS. Attending paged as well. Orders for Amiodarone bolus x2, lidocaine 100 given on my arrival. Labs drawn with Mg+ at 1.9. 2g Mg+ given. BMET drawn and K+ found to be 2.9 therefore 40 K+ IV and 80meq PO given with repeat lab work at 10am. Repeat lidocaine 50 and repeat Amio bolus 150 given per Dr. Haroldine Laws verbal orders. Awaiting attending arrival. Report given to Lyda Jester PA who is at bedside.   Kathyrn Drown NP-C [place] Pager: [phone]

## 2019-07-02 NOTE — CV Procedure (Signed)
    DIRECT CURRENT CARDIOVERSION  NAME:  GUNNAR HEREFORD   MRN: [id number] DOB:  [birth year]   ADMIT DATE: 06/17/2019   INDICATIONS: Ventricular fibrillation   PROCEDURE:   Recently implanted VAD patient developed VT this am. Given multiple rounds of amio and IV lidocaine without much effect. On my arrival was in coarse VF. MAP 60 with low flows on VAD. Patient conscious but lethargic.   Anesthesia paged. I gave him 2 rounds of versed 2mg  and fentanyl 29mcg with blow-by bag mask support. When patient was moderately sedately. I performed emergent cardioversion at 200J with prompt conversion to NSR and improved hemodynamics.    Glori Bickers, MD  7:50 AM

## 2019-07-02 NOTE — Progress Notes (Signed)
Upon transferring patient from [facility] to bed, patient went into conscious vfib. VAD coordinator paged and notified of the situation. Gave orders to give 150 mg amio bolus, 500 mL normal saline bolus, and 2 of magnesium. Dr. Haroldine Laws arrived and gave orders to give 6 mg versed and 100 mcg fentanyl. Patient cardioverted to NSR after one shock at 200J. Orders received for lidocaine bolus and gtt. Verbal order to stop milrinone. Will continue to closely monitor patient.

## 2019-07-02 NOTE — Anesthesia Preprocedure Evaluation (Deleted)
Anesthesia Evaluation    Airway        Dental   Pulmonary Current Smoker,           Cardiovascular hypertension, + CAD, + Past MI and +CHF    LVAD   Neuro/Psych    GI/Hepatic   Endo/Other    Renal/GU      Musculoskeletal   Abdominal   Peds  Hematology   Anesthesia Other Findings   Reproductive/Obstetrics                             Anesthesia Physical Anesthesia Plan Anesthesia Quick Evaluation

## 2019-07-02 NOTE — Progress Notes (Signed)
LVAD Coordinator Rounding Note:  Transferred from [facility] ED on 06/17/19 with STEMI. Cath revealed multi-vessel CAD, wire would not cross to open LAD; IABP placed and pt transferred here for evaluation for CABG.   Dr. Orvan Seen evaluated and found he was not amenable to surgical or percutaneous revascularization due to poor targets. With severe depressed LV function and little coronary perfusion, LVAD was recommended.   HM III LVAD implanted on 06/25/19 by Dr. Orvan Seen under Destination Therapy criteria due to recent smoking history.  On my arrival, pt had just been sedated and cardioverted from V-Fib. Pt is groggy, but able to answer my questions. States he doesn't remember the shock. Dr. Haroldine Laws dropped pts speed to 5000 when he arrived to the room.  Vital signs: Temp: 97.8 HR: 90 Doppler Pressure: 88 BP: 90/70 (77) O2 Sat: 98% on 4 L/[place] Wt: 207>225.7>222.6>214.3>213.4>203.9 lbs  LVAD parameters: Speed:  5000 Flow: 3.8 Power:  3.4w PI: 4.6 Hct: 33  Alarms: none Events:  30 today  Fixed speed: 5000 Low speed limit: 4700   Drive Line:  Existing VAD dressing removed and site care performed using sterile technique.  Drive line site cleaned with Chlora prep applicator x2, allowed to dry, and gauze dressing with silver strip re-applied.  Exit site with suture remaining, the velour is fully implanted at the exit site. No tissue ingrowth noted.   Moderate amount of redness noted at exit site. Minimal fatty necrosis drainage noted on dressing,no tenderness, foul odor or rash noted.  Drive line anchor reapplied.       Counter Incision: Clean, dry, intact. Mild redness noted at site. Dermabond intact.    Labs:  LDH trend: 660>398>347>459>462>410  INR trend: 1.4>1.3>1.1>1.2>1.4>1.5  Anticoagulation Plan: -INR Goal:  2.0 - 2.5 -ASA Dose: 325 mg daily until INR therapeutic  Blood Products:  -Intra-op - 06/25/19>>6 units FFP   Device: N/A  Drips: Milrinone 0.125  mcg/kg/min Lasix 8 mg/hr-off today Amiodarone 60mg /hr Heparin 1100 u/hr   Arrythmias: was on Amiodarone pre implant for NSVT  Respiratory: extubated 06/26/19  Nitric Oxide: off 06/26/19  VAD Education: 1. Pt's sister Mechele Claude at bedside. She performed drive line dressing change with VAD coordinator supervision and verbal cues. She had some difficulty putting on gloves, but maintains sterile technique once gloves in place. Questions about dressing change process answered. Plan for her to perform dressing change with VAD coordinator on Friday. 2. Discussed batteries and clips with Mechele Claude.  Had her perform power source change, emphasizing importance of "one power source at a time."  3. Patient slept through teaching.   Plan/Recommendations:  1. Call VAD pager if any VAD equipment or drive line issues. 2. Daily dressing changes per VAD Coordinator, Nurse Davonna Belling, or trained caregiver.  Tanda Rockers RN [place] Coordinator  Office: [phone]  24/7 Pager: [phone]

## 2019-07-02 NOTE — Progress Notes (Signed)
[place] for IV heparin, Coumadin Indication: LVAD  Allergies  Allergen Reactions  . Codeine Hives    Patient Measurements: Height: 5\' 9"  (175.3 cm) Weight: 92.5 kg (203 lb 14.8 oz) IBW/kg (Calculated) : 70.7 Heparin Dosing Weight: ~ 92 kg  Vital Signs: Temp: 97.8 F (36.6 C) (07/01 1131) Temp Source: Oral (07/01 1131) BP: 75/59 (07/01 1300) Pulse Rate: 72 (07/01 1300)  Labs: Recent Labs    06/29/19 1830 06/30/19 0815 06/30/19 0945 06/30/19 0945 07/01/19 0500 07/01/19 0500 07/01/19 1430 07/02/19 0531 07/02/19 0655 07/02/19 1130  HGB  --  10.2*  --    < > 10.0*   < >  --  11.1* 11.9*  --   HCT  --  31.8*  --    < > 30.9*  --   --  33.2* 35.0*  --   PLT  --  225  --   --  259  --   --  307  --   --   LABPROT  --   --  15.0  --  16.4*  --   --  17.3*  --   --   INR  --   --  1.2  --  1.4*  --   --  1.5*  --   --   HEPARINUNFRC   < >  --  0.18*   < > 0.12*  --  <0.10* 0.29*  --   --   CREATININE  --   --  1.18   < > 1.01   < >  --  1.00 1.10 1.12   < > = values in this interval not displayed.    Estimated Creatinine Clearance: 83.7 mL/min (by C-G formula based on SCr of 1.12 mg/dL).   Medical History: Past Medical History:  Diagnosis Date  . Acute anterolateral wall MI ([place])   . CAD, multiple vessel   . Hypertension   . Ischemic cardiomyopathy   . Systolic heart failure ([place])   . Tobacco abuse     Medications:  Infusions:  . sodium chloride    . amiodarone 30 mg/hr (07/02/19 1300)  . heparin 1,100 Units/hr (07/02/19 1300)  . lactated ringers    . milrinone Stopped (07/02/19 3382)  . vasopressin (PITRESSIN) infusion - *FOR SHOCK*      Assessment: 54 yo male s/p LVAD HM3 placement 6/24.  Pharmacy asked to dose  anticoagulation with IV heparin and Coumadin.  No overt bleeding or complications noted.  Hgb low but stable post-op.  Heparin level 0.29, INR trending up to 1.5, H/H stable, LDH trending up. Per RN no  bleeding issues overnight.   Goal of Therapy:  INR 2-2.5  HL goal about 0.2  Monitor platelets by anticoagulation protocol: Yes   Plan:  -Continue IV heparin at 1100 units/h -Warfarin 4mg  PO x1 again tonight -Daily INR, heparin level, CBC, LDH  [place] Crane, Roylene Reason, [facility] Clinical Pharmacist  07/02/2019 1:50 PM   [facility] pharmacy phone numbers are listed on [email or web address]

## 2019-07-02 NOTE — Progress Notes (Addendum)
Patient ID: Aaron [name], male   DOB: 1965/06/28, 54 y.o.   MRN: [id number]     Advanced Heart Failure Rounding Note  PCP-Cardiologist: No primary care provider on file.   Subjective:    HM3 placement 6/24   Pt developed sustained VT>>VF w/ rates in the 200s ~0630 am. He's currently alert and oriented. Doppler MAP 60.    K 2.9 Mg 1.9   Got IV lasix and metolazone yesterday. -5L in UOP. CVP ~10 (down from 17 yesterday).  On Milrinone 0.25 (dose increased 6/29). Co-ox 52%  Has received 3 boluses of IV amio + 150 of lidocaine. Remains in VF.    LVAD Interrogation HM III: Speed: 5200 Flow: 3.5  PI: 1.7 Power: 3.5  Multiple PI events.    Objective:   Weight Range: 92.5 kg Body mass index is 30.11 kg/m.   Vital Signs:   Temp:  [97.6 F (36.4 C)-98.3 F (36.8 C)] 97.6 F (36.4 C) (07/01 0415) Pulse Rate:  [46-97] 97 (06/30 1900) Resp:  [11-23] 17 (07/01 0500) BP: (64-84)/(33-67) 77/61 (06/30 2306) SpO2:  [85 %-97 %] 97 % (07/01 0500) Weight:  [92.5 kg] 92.5 kg (07/01 0604) Last BM Date: 07/01/19  Weight change: Filed Weights   06/30/19 0631 07/01/19 0600 07/02/19 0604  Weight: 97.2 kg 96.8 kg 92.5 kg    Intake/Output:   Intake/Output Summary (Last 24 hours) at 07/02/2019 0713 Last data filed at 07/02/2019 0603 Gross per 24 hour  Intake 1203.46 ml  Output 4950 ml  Net -3746.54 ml      Physical Exam   MAP 60 General:  Alert and oriented in sustained VT/VF but no respiratory difficulty HEENT: normal Neck: supple. JVD elevated to ear. Carotids 2+ bilat; no bruits. No lymphadenopathy or thyromegaly appreciated. Cor: + LVAD Hum  Lungs: clear Abdomen: soft, nontender, nondistended. No hepatosplenomegaly. No bruits or masses. Good bowel sounds.  LVAD exit site:  Dressing dry and intact.  No erythema or drainage.  Stabilization device present and accurately applied. Extremities: no cyanosis, clubbing, rash, edema Neuro: alert & oriented x 3, cranial nerves grossly  intact. moves all 4 extremities w/o difficulty. Affect pleasant.   Telemetry   VT 200s   Labs    CBC Recent Labs    06/30/19 0815 06/30/19 0815 07/01/19 0500 07/02/19 0531  WBC 9.2   < > 12.5* 18.3*  NEUTROABS 7.4  --   --   --   HGB 10.2*   < > 10.0* 11.1*  HCT 31.8*   < > 30.9* 33.2*  MCV 100.6*   < > 99.4 96.0  PLT 225   < > 259 307   < > = values in this interval not displayed.   Basic Metabolic Panel Recent Labs    07/01/19 0500 07/02/19 0531  NA 130* 127*  K 3.5 2.9*  CL 94* 84*  CO2 27 30  GLUCOSE 120* 130*  BUN 11 10  CREATININE 1.01 1.00  CALCIUM 8.3* 8.9  MG 2.0 1.9  PHOS 4.2 4.1   Liver Function Tests No results for input(s): AST, ALT, ALKPHOS, BILITOT, PROT, ALBUMIN in the last 72 hours. No results for input(s): LIPASE, AMYLASE in the last 72 hours. Cardiac Enzymes No results for input(s): CKTOTAL, CKMB, CKMBINDEX, TROPONINI in the last 72 hours.  BNP: BNP (last 3 results) Recent Labs    06/18/19 0119 06/26/19 0445 07/02/19 0531  BNP 1,068.5* 1,301.1* 1,646.4*    ProBNP (last 3 results) No results for input(s): PROBNP  in the last 8760 hours.   D-Dimer No results for input(s): DDIMER in the last 72 hours. Hemoglobin A1C No results for input(s): HGBA1C in the last 72 hours. Fasting Lipid Panel No results for input(s): CHOL, HDL, LDLCALC, TRIG, CHOLHDL, LDLDIRECT in the last 72 hours. Thyroid Function Tests No results for input(s): TSH, T4TOTAL, T3FREE, THYROIDAB in the last 72 hours.  Invalid input(s): FREET3  Other results:   Imaging    No results found.   Medications:     Scheduled Medications: . amiodarone  200 mg Oral BID  . aspirin EC  325 mg Oral Daily   Or  . aspirin  324 mg Per Tube Daily  . atorvastatin  80 mg Oral Daily  . bisacodyl  10 mg Oral Daily   Or  . bisacodyl  10 mg Rectal Daily  . Chlorhexidine Gluconate Cloth  6 each Topical Daily  . clonazePAM  0.5 mg Oral BID  . colchicine  0.6 mg Oral BID    . digoxin  0.125 mg Oral Daily  . docusate sodium  200 mg Oral Daily  . doxycycline  100 mg Oral Q12H  . folic acid  1 mg Oral Daily  . gabapentin  300 mg Oral BID  . insulin aspart  0-15 Units Subcutaneous TID WC  . mouth rinse  15 mL Mouth Rinse BID  . multivitamin with minerals  1 tablet Oral Daily  . pantoprazole  40 mg Oral Daily  . sildenafil  20 mg Oral TID  . sodium chloride flush  10-40 mL Intracatheter Q12H  . spironolactone  12.5 mg Oral Daily  . thiamine  100 mg Oral Daily  . Warfarin - Pharmacist Dosing Inpatient   Does not apply q1600    Infusions: . sodium chloride    . amiodarone    . furosemide (LASIX) infusion 8 mg/hr (07/02/19 0500)  . heparin 1,100 Units/hr (07/02/19 0546)  . lactated ringers    . magnesium sulfate bolus IVPB 2 g (07/02/19 0646)  . milrinone 0.25 mcg/kg/min (07/02/19 0500)  . potassium chloride    . potassium chloride      PRN Medications: acetaminophen, ALPRAZolam, bisacodyl, calcium carbonate, dextrose, fentaNYL (SUBLIMAZE) injection, morphine injection, ondansetron (ZOFRAN) IV, oxyCODONE, pneumococcal 23 valent vaccine, sodium chloride flush, sodium chloride flush, traMADol, traZODone    Patient Profile   Christie Copley Blouch --post MI cardiogenic shock/ optimization prior to anticipated CABG, at the request of Dr. Marlou Porch, Cardiology.    Assessment/Plan    1. Acute Anterolateral STEMI/ Multivessel CAD - Emergent cath showed severe multivessel disease w/ 75% mid- distal LM, 100% prox LAD, not amendable to PCI (unable to cross w/ guide wire, probable CTO), 100% prox LCx (probable CTO) and 100% ostial RCA (probable CTO). There were mild to moderate collaterals right to left to the LAD, minimal right to right collaterals an minimal left to left collaterals. LVEF 25% - Hs trop peaked 27,000. - No targets for CABG / PCI.   - S/P HMIII VAD  2.  Acute Systolic Heart Failure --->Cardiogenic Shock - 2/2 acute MI. LVEF by LVG 25% - IABP out  6/18 but replaced 6/23. Removed in OR on 6/24  - HMIII LVAD  Placed 6/24 - Extubated 6/25  - was on milrinone 0.25. CO-OX 52%.  Milrinone currently on hold w/ sustained VT.   - Continue digoxin.  - On low dose heparin gtt today + warfarin. INR 1.5  - LDH up from 347>>459>463>517 - CVP 11. Hold lasix  gtt for now w/ hypokalemia + VT.  - Continue 12.5 mg spironolactone daily.   - Agressively  supp K and Mg  - Continue Gabapentin 300 mg bid for pocket pain - Reduce Speed to 5000 - Ramp ECHO soon.   3. Sustained VT/VF  - developed sustained VT on 7/1 in the setting of K 2.9 and Mg 1.9  - no chemical conversion w/ IV amio + lidocaine - successful  DCCV x 1.  - aggressively supp K and Mg  - keep K> 4.0 Mg >2.0 .  - Continue amiodarone gtt - Hold milrinone for now  4  Hypertension  - reported h/o untreated HTN prior to admit - Stable.   5. Tobacco Abuse - 30 pack years  - smoking cessation advised  - Will need to quit if to be considered for heart transplant in the future   6. Elevated LFTs due to shock liver - improved  7. Renal mass = RCC - MRI abdomen- 1.8 cm right renal lesion, most consistent with renal cell carcinoma. No evidence of renal vein involvement or nodal metastasis. - Discussed w/ Urology and R RCC is small and will just require surveillance.  Ok to proceed with VAD from their standpoint.   8. Hyponatremia  -Sodium 127. Restrict free water.   9. Sleep Apnea - frequent apnea at night  - needs formal sleep study   - CPAP ordered while inpatient  10. Hypokalemia - K 2.9. Mg 1.9 in setting of aggressive diuresis w/ lasix gtt + metolazone - hold diuretics  - aggressive K and Mg supplementation - repeat BMP and Mg at 1200   Brittainy Simmons PA-C   07/02/2019 7:13 AM   Agree with above.   Milrinone increased on Tuesday for RV failure. Was on IV lasix for volume overload. Developed sustained VT -> VF this am in setting of hypokalemia/hypomagnesemia. Did  not convert with amio/lido. Developed hemodynamic instability and cardioverted emergently.   Now awake. MAPs and flows stable. VAD speed turned down 5200 -> 5000.   General:  Sleepy but arousable after shock  HEENT: normal  Neck: supple. JVP to jaw  Carotids 2+ bilat; no bruits. No lymphadenopathy or thryomegaly appreciated. Cor: LVAD hum.  Lungs: Clear. Abdomen: obese soft, nontender, non-distended. No hepatosplenomegaly. No bruits or masses. Good bowel sounds. Driveline site clean. Anchor in place.  Extremities: no cyanosis, clubbing, rash. Warm no edema  Neuro: alert & oriented x 3. No focal deficits. Moves all 4 without problem   Now more stable after DC-CV for VF. Would hold off on restarting milrinone for now until electrolytes sufficiently replaced. Continue amio. Can increase speed back to 5100. Continue heparin/warfarin (INR 1.5).   CRITICAL CARE Performed by: Glori Bickers  Total critical care time: 45 minutes  Critical care time was exclusive of separately billable procedures and treating other patients.  Critical care was necessary to treat or prevent imminent or life-threatening deterioration.  Critical care was time spent personally by me (independent of midlevel providers or residents) on the following activities: development of treatment plan with patient and/or surrogate as well as nursing, discussions with consultants, evaluation of patient's response to treatment, examination of patient, obtaining history from patient or surrogate, ordering and performing treatments and interventions, ordering and review of laboratory studies, ordering and review of radiographic studies, pulse oximetry and re-evaluation of patient's condition.  Glori Bickers, MD  8:02 AM

## 2019-07-02 NOTE — Progress Notes (Signed)
   Patient developed recurrent VF this evening.   Felt very dizzy but MAPS ~ 70. We gave IVF, amio bolus and mag with no effect,  Patient more apprehensive and uncomfortable.   Decision made for emergent DC-CV.   RT in room. Anesthesia page. Patient moderately sedated and underwent emergent DC-CV to NSR.   Post DC-CV MAP 80. Respirations stable.   Lidocaine bolus and gtt started.   Additional CCT 45 mins. Not including DC-CV.   Glori Bickers, MD  9:42 PM

## 2019-07-02 NOTE — CV Procedure (Signed)
    DIRECT CURRENT CARDIOVERSION  NAME:  Aaron [name]   MRN: [id number] DOB:  10-Feb-1965   ADMIT DATE: 06/17/2019   INDICATIONS: Ventricular fibrillation    PROCEDURE:   Recently implanted VAD patient developed VT this am and underwent emergent DC-CV.  Developed recurrent VF this evening and became progressively symptomatic. Given IVF, amio, and Mg with no benefit.   Anesthesia paged. RT in room.  I gave him a total of versed 6mg  and fentanyl 165mcg with blow-by bag mask support. When patient was moderately sedately. I performed emergent cardioversion at 200J with prompt conversion to NSR and improved hemodynamics.    Glori Bickers, MD  9:37 PM

## 2019-07-03 ENCOUNTER — Encounter (HOSPITAL_COMMUNITY): Payer: Self-pay | Admitting: Internal Medicine

## 2019-07-03 LAB — BASIC METABOLIC PANEL
Anion gap: 13 (ref 5–15)
Anion gap: 13 (ref 5–15)
Anion gap: 11 (ref 5–15)
BUN: 15 mg/dL (ref 6–20)
BUN: 14 mg/dL (ref 6–20)
BUN: 13 mg/dL (ref 6–20)
CO2: 25 mmol/L (ref 22–32)
CO2: 25 mmol/L (ref 22–32)
CO2: 26 mmol/L (ref 22–32)
Calcium: 8.1 mg/dL — ABNORMAL LOW (ref 8.9–10.3)
Calcium: 8.2 mg/dL — ABNORMAL LOW (ref 8.9–10.3)
Calcium: 8.4 mg/dL — ABNORMAL LOW (ref 8.9–10.3)
Chloride: 84 mmol/L — ABNORMAL LOW (ref 98–111)
Chloride: 86 mmol/L — ABNORMAL LOW (ref 98–111)
Chloride: 89 mmol/L — ABNORMAL LOW (ref 98–111)
Creatinine, Ser: 1.04 mg/dL (ref 0.61–1.24)
Creatinine, Ser: 0.94 mg/dL (ref 0.61–1.24)
Creatinine, Ser: 0.94 mg/dL (ref 0.61–1.24)
GFR calc Af Amer: >60 mL/min (ref >60)
GFR calc Af Amer: >60 mL/min (ref >60)
GFR calc Af Amer: >60 mL/min (ref >60)
GFR calc non Af Amer: >60 mL/min (ref >60)
GFR calc non Af Amer: >60 mL/min (ref >60)
GFR calc non Af Amer: >60 mL/min (ref >60)
Glucose, Bld: 246 mg/dL — ABNORMAL HIGH (ref 70–99)
Glucose, Bld: 269 mg/dL — ABNORMAL HIGH (ref 70–99)
Glucose, Bld: 253 mg/dL — ABNORMAL HIGH (ref 70–99)
Potassium: 3.4 mmol/L — ABNORMAL LOW (ref 3.5–5.1)
Potassium: 3.7 mmol/L (ref 3.5–5.1)
Potassium: 4 mmol/L (ref 3.5–5.1)
Sodium: 122 mmol/L — ABNORMAL LOW (ref 135–145)
Sodium: 124 mmol/L — ABNORMAL LOW (ref 135–145)
Sodium: 126 mmol/L — ABNORMAL LOW (ref 135–145)

## 2019-07-03 LAB — PROTIME-INR
INR: 1.7 — ABNORMAL HIGH (ref 0.8–1.2)
Prothrombin Time: 19.1 seconds — ABNORMAL HIGH (ref 11.4–15.2)

## 2019-07-03 LAB — SODIUM: Sodium: 129 mmol/L — ABNORMAL LOW (ref 135–145)

## 2019-07-03 LAB — GLUCOSE, CAPILLARY
Glucose-Capillary: 105 mg/dL — ABNORMAL HIGH (ref 70–99)
Glucose-Capillary: 170 mg/dL — ABNORMAL HIGH (ref 70–99)
Glucose-Capillary: 237 mg/dL — ABNORMAL HIGH (ref 70–99)
Glucose-Capillary: 155 mg/dL — ABNORMAL HIGH (ref 70–99)

## 2019-07-03 LAB — COOXEMETRY PANEL
Carboxyhemoglobin: 1 % (ref 0.5–1.5)
Methemoglobin: 0.8 % (ref 0.0–1.5)
O2 Saturation: 46.1 %
Total hemoglobin: 10.8 g/dL — ABNORMAL LOW (ref 12.0–16.0)

## 2019-07-03 LAB — MAGNESIUM: Magnesium: 2.4 mg/dL (ref 1.7–2.4)

## 2019-07-03 LAB — CBC
HCT: 31.6 % — ABNORMAL LOW (ref 39.0–52.0)
Hemoglobin: 10.5 g/dL — ABNORMAL LOW (ref 13.0–17.0)
MCH: 32.1 pg (ref 26.0–34.0)
MCHC: 33.2 g/dL (ref 30.0–36.0)
MCV: 96.6 fL (ref 80.0–100.0)
Platelets: 323 10*3/uL (ref 150–400)
RBC: 3.27 MIL/uL — ABNORMAL LOW (ref 4.22–5.81)
RDW: 13.7 % (ref 11.5–15.5)
WBC: 14.9 10*3/uL — ABNORMAL HIGH (ref 4.0–10.5)
nRBC: 0 % (ref 0.0–0.2)

## 2019-07-03 LAB — LIDOCAINE LEVEL: Lidocaine Lvl: 3.4 ug/mL (ref 1.5–5.0)

## 2019-07-03 LAB — HEPARIN LEVEL (UNFRACTIONATED): Heparin Unfractionated: 0.19 IU/mL — ABNORMAL LOW (ref 0.30–0.70)

## 2019-07-03 LAB — LACTATE DEHYDROGENASE: LDH: 554 U/L — ABNORMAL HIGH (ref 98–192)

## 2019-07-03 MED ORDER — TOLVAPTAN 15 MG PO TABS
30.0000 mg | ORAL_TABLET | ORAL | Status: AC
  Administered 2019-07-03: 30 mg via ORAL
  Filled 2019-07-03: qty 2

## 2019-07-03 MED ORDER — POTASSIUM CHLORIDE CRYS ER 20 MEQ PO TBCR
20.0000 meq | EXTENDED_RELEASE_TABLET | ORAL | Status: AC
  Administered 2019-07-03 (×3): 20 meq via ORAL
  Filled 2019-07-03 (×3): qty 1

## 2019-07-03 MED ORDER — GABAPENTIN 300 MG PO CAPS
300.0000 mg | ORAL_CAPSULE | Freq: Three times a day (TID) | ORAL | Status: DC
  Administered 2019-07-03 – 2019-07-10 (×19): 300 mg via ORAL
  Filled 2019-07-03 (×6): qty 1
  Filled 2019-07-03: qty 3
  Filled 2019-07-03 (×12): qty 1

## 2019-07-03 MED ORDER — WARFARIN SODIUM 5 MG PO TABS
5.0000 mg | ORAL_TABLET | Freq: Once | ORAL | Status: AC
  Administered 2019-07-03: 5 mg via ORAL
  Filled 2019-07-03: qty 1

## 2019-07-03 NOTE — Progress Notes (Signed)
OT Cancellation Note  Patient Details Name: Aaron [name] MRN: [id number] DOB: 04-07-1965   Cancelled Treatment:    Reason Eval/Treat Not Completed: Medical issues which prohibited therapy Pt with VF 7/1. Underwent DC-CV to NSR. Per MD will hold today. Will follow up next week.   Ramond Dial, OT/L   Acute OT Clinical Specialist Acute Rehabilitation Services Pager [phone] Office [phone]  07/03/2019, 11:46 AM

## 2019-07-03 NOTE — Progress Notes (Signed)
[place] for IV heparin, Coumadin Indication: LVAD  Allergies  Allergen Reactions  . Codeine Hives    Patient Measurements: Height: 5\' 9"  (175.3 cm) Weight: 93 kg (205 lb 0.4 oz) IBW/kg (Calculated) : 70.7 Heparin Dosing Weight: ~ 92 kg  Vital Signs: Temp: 98.2 F (36.8 C) (07/02 1201) Temp Source: Oral (07/02 1201) BP: 117/85 (07/02 0700) Pulse Rate: 73 (07/02 1211)  Labs: Recent Labs    07/01/19 0500 07/01/19 0500 07/01/19 1430 07/02/19 0531 07/02/19 0531 07/02/19 0655 07/02/19 1130 07/02/19 2259 07/03/19 0332 07/03/19 0950  HGB 10.0*   < >  --  11.1*   < > 11.9*  --   --  10.5*  --   HCT 30.9*   < >  --  33.2*  --  35.0*  --   --  31.6*  --   PLT 259  --   --  307  --   --   --   --  323  --   LABPROT 16.4*  --   --  17.3*  --   --   --   --  19.1*  --   INR 1.4*  --   --  1.5*  --   --   --   --  1.7*  --   HEPARINUNFRC 0.12*   < > <0.10* 0.29*  --   --   --   --  0.19*  --   CREATININE 1.01   < >  --  1.00   < > 1.10   < > 1.04 0.94 0.94   < > = values in this interval not displayed.    Estimated Creatinine Clearance: 100 mL/min (by C-G formula based on SCr of 0.94 mg/dL).   Medical History: Past Medical History:  Diagnosis Date  . Acute anterolateral wall MI ([place])   . CAD, multiple vessel   . Hypertension   . Ischemic cardiomyopathy   . Systolic heart failure ([place])   . Tobacco abuse     Medications:  Infusions:  . sodium chloride    . amiodarone 30 mg/hr (07/03/19 0800)  . heparin 1,100 Units/hr (07/03/19 0800)  . lactated ringers    . lidocaine 1 mg/min (07/03/19 0800)  . milrinone Stopped (07/02/19 2130)  . vasopressin (PITRESSIN) infusion - *FOR SHOCK*      Assessment: 54 yo male s/p LVAD HM3 placement 6/24.  Pharmacy asked to dose  anticoagulation with IV heparin and Coumadin.  No overt bleeding or complications noted.  Hgb low but stable post-op.  Heparin level 0.19, INR trending up to 1.7, H/H  stable, LDH trending up. Per RN no bleeding issues overnight.   Goal of Therapy:  INR 2-2.5  HL goal about 0.2  Monitor platelets by anticoagulation protocol: Yes   Plan:  -Continue IV heparin at 1100 units/h -Warfarin 5 mg PO x1  tonight -Daily INR, heparin level, CBC, LDH  [place] Crane, Roylene Reason, [facility] Clinical Pharmacist  07/03/2019 12:38 PM   [facility] pharmacy phone numbers are listed on [email or web address]

## 2019-07-03 NOTE — Progress Notes (Signed)
Patient ID: Aaron [name], male   DOB: 18-Aug-1965, 54 y.o.   MRN: [id number]     Advanced Heart Failure Rounding Note  PCP-Cardiologist: No primary care provider on file.   Subjective:    HM3 placement 6/24   Had 2 episodes of VF requiring emergent DC-CV on 07/02/19  Off milrinone now Co-ox 46%  On amio and lido at 1. Rhythm stable so far today. Having severe pocket pain. CVP 15. Sodium 124.    LVAD Interrogation HM III: Speed: 5100 Flow: 4.4  PI: 2.7 Power: 4.0  VAD interrogated personally. Parameters stable.    Objective:   Weight Range: 93 kg Body mass index is 30.28 kg/m.   Vital Signs:   Temp:  [97.5 F (36.4 C)-98.4 F (36.9 C)] 98.1 F (36.7 C) (07/02 0753) Pulse Rate:  [30-132] 39 (07/02 0800) Resp:  [10-24] 17 (07/02 0800) BP: (71-117)/(44-88) 117/85 (07/02 0700) SpO2:  [77 %-100 %] 98 % (07/02 0800) Weight:  [93 kg] 93 kg (07/02 0500) Last BM Date: 07/02/19  Weight change: Filed Weights   07/01/19 0600 07/02/19 0604 07/03/19 0500  Weight: 96.8 kg 92.5 kg 93 kg    Intake/Output:   Intake/Output Summary (Last 24 hours) at 07/03/2019 0842 Last data filed at 07/03/2019 0800 Gross per 24 hour  Intake 1373.21 ml  Output 400 ml  Net 973.21 ml      Physical Exam   General:  Sitting in bed. Uncomfortable due to pocket pain.   HEENT: normal  Neck: supple. JVP tp jaw  Carotids 2+ bilat; no bruits. No lymphadenopathy or thryomegaly appreciated. Cor: sternal incision ok. LVAD hum.  Lungs: Clear. Abdomen: obese soft, nontender, non-distended. No hepatosplenomegaly. No bruits or masses. Good bowel sounds. Driveline site clean. Anchor in place.  Extremities: no cyanosis, clubbing, rash. 1+ edema  Neuro: alert & oriented x 3. No focal deficits. Moves all 4 without problem     Telemetry   Sinus 60-70s Personally reviewed   Labs    CBC Recent Labs    07/02/19 0531 07/02/19 0531 07/02/19 0655 07/03/19 0332  WBC 18.3*  --   --  14.9*  HGB 11.1*   <  > 11.9* 10.5*  HCT 33.2*   < > 35.0* 31.6*  MCV 96.0  --   --  96.6  PLT 307  --   --  323   < > = values in this interval not displayed.   Basic Metabolic Panel Recent Labs    07/01/19 0500 07/01/19 0500 07/02/19 0531 07/02/19 0655 07/02/19 2259 07/03/19 0332  NA 130*   < > 127*   < > 122* 124*  K 3.5   < > 2.9*   < > 3.4* 3.7  CL 94*   < > 84*   < > 84* 86*  CO2 27   < > 30   < > 25 25  GLUCOSE 120*   < > 130*   < > 246* 269*  BUN 11   < > 10   < > 15 14  CREATININE 1.01   < > 1.00   < > 1.04 0.94  CALCIUM 8.3*   < > 8.9   < > 8.1* 8.2*  MG 2.0   < > 1.9  --   --  2.4  PHOS 4.2  --  4.1  --   --   --    < > = values in this interval not displayed.   Liver Function Tests No  results for input(s): AST, ALT, ALKPHOS, BILITOT, PROT, ALBUMIN in the last 72 hours. No results for input(s): LIPASE, AMYLASE in the last 72 hours. Cardiac Enzymes No results for input(s): CKTOTAL, CKMB, CKMBINDEX, TROPONINI in the last 72 hours.  BNP: BNP (last 3 results) Recent Labs    06/18/19 0119 06/26/19 0445 07/02/19 0531  BNP 1,068.5* 1,301.1* 1,646.4*    ProBNP (last 3 results) No results for input(s): PROBNP in the last 8760 hours.   D-Dimer No results for input(s): DDIMER in the last 72 hours. Hemoglobin A1C No results for input(s): HGBA1C in the last 72 hours. Fasting Lipid Panel No results for input(s): CHOL, HDL, LDLCALC, TRIG, CHOLHDL, LDLDIRECT in the last 72 hours. Thyroid Function Tests No results for input(s): TSH, T4TOTAL, T3FREE, THYROIDAB in the last 72 hours.  Invalid input(s): FREET3  Other results:   Imaging    No results found.   Medications:     Scheduled Medications: . aspirin EC  325 mg Oral Daily   Or  . aspirin  324 mg Per Tube Daily  . atorvastatin  80 mg Oral Daily  . bisacodyl  10 mg Oral Daily   Or  . bisacodyl  10 mg Rectal Daily  . Chlorhexidine Gluconate Cloth  6 each Topical Daily  . clonazePAM  0.5 mg Oral BID  . colchicine   0.6 mg Oral BID  . digoxin  0.125 mg Oral Daily  . docusate sodium  200 mg Oral Daily  . doxycycline  100 mg Oral Q12H  . folic acid  1 mg Oral Daily  . gabapentin  300 mg Oral BID  . insulin aspart  0-15 Units Subcutaneous TID WC  . mouth rinse  15 mL Mouth Rinse BID  . multivitamin with minerals  1 tablet Oral Daily  . pantoprazole  40 mg Oral Daily  . phenylephrine      . sildenafil  20 mg Oral TID  . sodium chloride flush  10-40 mL Intracatheter Q12H  . spironolactone  12.5 mg Oral Daily  . thiamine  100 mg Oral Daily  . Warfarin - Pharmacist Dosing Inpatient   Does not apply q1600    Infusions: . sodium chloride    . amiodarone 30 mg/hr (07/03/19 0800)  . heparin 1,100 Units/hr (07/03/19 0800)  . lactated ringers    . lidocaine 1 mg/min (07/03/19 0800)  . milrinone Stopped (07/02/19 2130)  . vasopressin (PITRESSIN) infusion - *FOR SHOCK*      PRN Medications: acetaminophen, ALPRAZolam, bisacodyl, calcium carbonate, dextrose, fentaNYL (SUBLIMAZE) injection, morphine injection, ondansetron (ZOFRAN) IV, oxyCODONE, pneumococcal 23 valent vaccine, sodium chloride flush, sodium chloride flush, traMADol, traZODone    Patient Profile   Dusan Lipford [place] --post MI cardiogenic shock/ optimization prior to anticipated CABG, at the request of Dr. Marlou Porch, Cardiology.    Assessment/Plan   1. Sustained VT/VF  - developed VF on 7/1 x 2 requiring emergent DC-CV - suspect ischemically-mediated  - continue amio and mag. May benefit from ranexa - keep K> 4.0 Mg >2.0 .  - Keep milrinone off for now despite low co-ox - VAD speed reduced to 5000  2. Acute Anterolateral STEMI/ Multivessel CAD - Emergent cath showed severe multivessel disease w/ 75% mid- distal LM, 100% prox LAD, not amendable to PCI (unable to cross w/ guide wire, probable CTO), 100% prox LCx (probable CTO) and 100% ostial RCA (probable CTO). There were mild to moderate collaterals right to left to the LAD, minimal right  to right collaterals  an minimal left to left collaterals. LVEF 25% - Hs trop peaked 27,000. - No targets for CABG / PCI.   - S/P HMIII VAD - suspect VF is ischemically-mediated  3.  Acute Systolic Heart Failure --->Cardiogenic Shock - 2/2 acute MI. LVEF by LVG 25% - IABP out 6/18 but replaced 6/23. Removed in OR on 6/24  - HMIII LVAD  Placed 6/24 - Extubated 6/25  - off milrinone due to VF. Will not restart despite co-ox 46%   - Continue digoxin.  - On low dose heparin gtt today + warfarin. INR 1.7. Stop heparin  - LDH up from 347>>459>463>517>554. May be related to DC-CV - CVP 15. Will treat with tolvaptan +/- iv lasix - Continue 12.5 mg spironolactone daily.   - Increase gabapentin to 300 mg tid for pocket pain + back-up fentanyl - Ramp ECHO soon.  -VAD interrogated personally. Parameters stable.  4. Tobacco Abuse - 30 pack years  - smoking cessation advised  - Will need to quit if to be considered for heart transplant in the future   5. Renal mass = RCC - MRI abdomen- 1.8 cm right renal lesion, most consistent with renal cell carcinoma. No evidence of renal vein involvement or nodal metastasis. - Discussed w/ Urology and R RCC is small and will just require surveillance.  Ok to proceed with VAD from their standpoint.   8. Hyponatremia  -Sodium 124. Restrict free water. Give tolvaptan  9. Sleep Apnea - frequent apnea at night  - needs formal sleep study   - CPAP ordered while inpatient  10. Hypokalemia - supp  CRITICAL CARE Performed by: Glori Bickers  Total critical care time: 35 minutes  Critical care time was exclusive of separately billable procedures and treating other patients.  Critical care was necessary to treat or prevent imminent or life-threatening deterioration.  Critical care was time spent personally by me (independent of midlevel providers or residents) on the following activities: development of treatment plan with patient and/or surrogate as  well as nursing, discussions with consultants, evaluation of patient's response to treatment, examination of patient, obtaining history from patient or surrogate, ordering and performing treatments and interventions, ordering and review of laboratory studies, ordering and review of radiographic studies, pulse oximetry and re-evaluation of patient's condition.   Glori Bickers MD 07/03/2019 8:42 AM

## 2019-07-03 NOTE — Progress Notes (Signed)
LVAD Coordinator Rounding Note:  Transferred from [facility] ED on 06/17/19 with STEMI. Cath revealed multi-vessel CAD, wire would not cross to open LAD; IABP placed and pt transferred here for evaluation for CABG.   Dr. Orvan Seen evaluated and found he was not amenable to surgical or percutaneous revascularization due to poor targets. With severe depressed LV function and little coronary perfusion, LVAD was recommended.   HM III LVAD implanted on 06/25/19 by Dr. Orvan Seen under Destination Therapy criteria due to recent smoking history.  Pt laying in bed, states he is tired because he did not sleep well due to vfib episodes. Complaining of severe pump pocket pain. Will increase Gabapentin per Dr Haroldine Laws.   Received page overnight- patient in vfib- DCCV x 1. Lidocaine drip started. Currently in NSR w/ PVCs 70 PI events noted on interrogation for today; 60 yesterday. Several events are true suction.    Received dose of Tolvaptan this morning. Na 126.   Spoke with patient's sister on the phone. Instructed to take home patient handbook over the weekend to begin reviewing. Answered all questions re: plan for education next week and vfib episodes. She verbalized understanding of the above.   Vital signs: Temp: 97.8 HR: 63 Doppler Pressure: 82 BP: 81/65 (72) O2 Sat: 98% on 4 L/[place] Wt: 207>225.7>222.6>214.3>213.4>203.9>205.3 lbs  LVAD parameters: Speed:  5000 Flow: 4.2 Power:  3.4w PI: 2.9 Hct: 33  Alarms: none Events: 7 PI events today; 60 overnight  Fixed speed: 5000 Low speed limit: 4700   Drive Line:  Existing VAD dressing removed and site care performed using sterile technique.  Drive line site cleaned with Chlora prep applicator x2, allowed to dry, and gauze dressing with silver strip re-applied.  Exit site with suture remaining, the velour is fully implanted at the exit site. No tissue ingrowth noted. Moderate amount of redness noted at exit site. Minimal fatty necrosis drainage  noted on dressing, no tenderness, foul odor or rash noted.  Drive line anchor intact.       Counter Incision: Clean, dry, intact. Mild redness noted at site. Dermabond intact.    Labs:  LDH trend: 660>398>347>459>462>410>554  INR trend: 1.4>1.3>1.1>1.2>1.4>1.5>1.7  Anticoagulation Plan: -INR Goal:  2.0 - 2.5 -ASA Dose: 325 mg daily until INR therapeutic  Blood Products:  -Intra-op - 06/25/19>>6 units FFP   Device: N/A  Drips: Milrinone 0.125 mcg/kg/min>> off Lasix 8 mg/hr-off today Amiodarone 30mg /hr  Heparin 1100 u/hr Lidocaine 1 mg/min   Arrythmias: was on Amiodarone pre implant for NSVT  Respiratory: extubated 06/26/19  Nitric Oxide: off 06/26/19  VAD Education: 1. No family at bedside.  2. Patient tired and unable to stay awake for teaching today.    Plan/Recommendations:  1. Call VAD pager if any VAD equipment or drive line issues. 2. Daily dressing changes per VAD Coordinator, Nurse Davonna Belling, or trained caregiver.  Emerson Monte RN [place] Coordinator  Office: [phone]  24/7 Pager: [phone]

## 2019-07-03 NOTE — Progress Notes (Signed)
Physical Therapy Treatment Patient Details Name: Aaron [name] MRN: [id number] DOB: 1965/04/29 Today's Date: 07/03/2019    History of Present Illness Pt is 54yo admitted 6/16 with STEMI with multivessel disease. cath with no viable targets for CABG. Pt s/[name] HeartMate 3 LVAD implantation 6/24. VT 2x on 7/1 s/[name] DCCV. PMHx: HTN, tobacco use (30pack yrs)    PT Comments    Pt supine on arrival stating difficulty sleeping given events of yesterday. Pt willing to participate and cleared with RN within pt tolerance with decreased gait today. Pt able to transition to and from battery with only cues x 1 to attend to not connecting battery to power line. Assist for holster due to lines. Pt educated for sternal precautions and tolerated mobility well. Unable to obtain automatic cuff pressure with HR 63-72  Speed 5000, flow 4.0, PI 3.8-4.8, power 3.4    Follow Up Recommendations  Home health PT;Supervision/Assistance - 24 hour     Equipment Recommendations  Other (comment) (rollator)    Recommendations for Other Services       Precautions / Restrictions Precautions Precautions: Fall;Other (comment);Sternal Precaution Comments: LVAD    Mobility  Bed Mobility Overal bed mobility: Needs Assistance Bed Mobility: Supine to Sit     Supine to sit: Min guard     General bed mobility comments: minguard for line management with cues for precautions  Transfers Overall transfer level: Needs assistance   Transfers: Sit to/from Stand Sit to Stand: Min guard         General transfer comment: cues for hand placement and safety  Ambulation/Gait Ambulation/Gait assistance: Min guard Gait Distance (Feet): 150 Feet Assistive device: Rolling walker (2 wheeled) Gait Pattern/deviations: Step-through pattern;Decreased stride length;Trunk flexed   Gait velocity interpretation: 1.31 - 2.62 ft/sec, indicative of limited community ambulator General Gait Details: slow cautious gait with RW moving  within pt tolerance with pt directing speed and distance with HR 72 during gait and SpO2 92% on RA   Stairs             Wheelchair Mobility    Modified Rankin (Stroke Patients Only)       Balance Overall balance assessment: Needs assistance   Sitting balance-Leahy Scale: Good     Standing balance support: Bilateral upper extremity supported;During functional activity Standing balance-Leahy Scale: Fair                              Cognition Arousal/Alertness: Awake/alert Behavior During Therapy: Flat affect Overall Cognitive Status: Within Functional Limits for tasks assessed                                        Exercises General Exercises - Lower Extremity Long Arc Quad: AROM;Both;Seated;15 reps Hip Flexion/Marching: AROM;Both;Seated;15 reps    General Comments        Pertinent Vitals/Pain Pain Score: 6  Pain Location: pocket pain with increase from 4-6 with activity Pain Descriptors / Indicators: Aching Pain Intervention(s): Limited activity within patient's tolerance;Monitored during session;Patient requesting pain meds-RN notified;Repositioned    Home Living                      Prior Function            PT Goals (current goals can now be found in the care plan section) Progress towards PT goals:  Progressing toward goals    Frequency    Min 4X/week      PT Plan Current plan remains appropriate    Co-evaluation              AM-PAC PT "6 Clicks" Mobility   Outcome Measure  Help needed turning from your back to your side while in a flat bed without using bedrails?: A Little Help needed moving from lying on your back to sitting on the side of a flat bed without using bedrails?: A Little Help needed moving to and from a bed to a chair (including a wheelchair)?: A Little Help needed standing up from a chair using your arms (e.g., wheelchair or bedside chair)?: A Little Help needed to walk in  hospital room?: A Little Help needed climbing 3-5 steps with a railing? : A Little 6 Click Score: 18    End of Session   Activity Tolerance: Patient tolerated treatment well Patient left: in chair;with call bell/phone within reach;with family/visitor present;with nursing/sitter in room Nurse Communication: Mobility status PT Visit Diagnosis: Other abnormalities of gait and mobility (R26.89)     Time: 4835-0757 PT Time Calculation (min) (ACUTE ONLY): 28 min  Charges:  $Gait Training: 8-22 mins $Therapeutic Activity: 8-22 mins                     Aaron [name], PT Acute Rehabilitation Services Pager: [phone] Office: [phone]    Aaron [name] Aaron [name] 07/03/2019, 12:15 PM

## 2019-07-03 NOTE — Progress Notes (Signed)
CSW met with patient at bedside. Patient shared he had a rough night last night and did not get much sleep. He shared that he has "had thoughts that I may not survive this". CSW and patient spoke of coping mechanisms and provided encouragement. Patient shared that his tattoo on his arm was for his son and is a source of strength for him. Patient appeared to be feeling improved and trying to remain positive regarding his recovery. CSW continues to follow for supportive intervention throughout implant hospitalization. Raquel Sarna, [place], [place]

## 2019-07-04 DIAGNOSIS — I4901 Ventricular fibrillation: Secondary | ICD-10-CM

## 2019-07-04 DIAGNOSIS — I2510 Atherosclerotic heart disease of native coronary artery without angina pectoris: Secondary | ICD-10-CM

## 2019-07-04 DIAGNOSIS — I255 Ischemic cardiomyopathy: Secondary | ICD-10-CM

## 2019-07-04 DIAGNOSIS — I420 Dilated cardiomyopathy: Secondary | ICD-10-CM

## 2019-07-04 LAB — CBC
HCT: 30.5 % — ABNORMAL LOW (ref 39.0–52.0)
HCT: 33.7 % — ABNORMAL LOW (ref 39.0–52.0)
Hemoglobin: 8.9 g/dL — ABNORMAL LOW (ref 13.0–17.0)
Hemoglobin: 10.7 g/dL — ABNORMAL LOW (ref 13.0–17.0)
MCH: 31.3 pg (ref 26.0–34.0)
MCH: 31.8 pg (ref 26.0–34.0)
MCHC: 29.2 g/dL — ABNORMAL LOW (ref 30.0–36.0)
MCHC: 31.8 g/dL (ref 30.0–36.0)
MCV: 107.4 fL — ABNORMAL HIGH (ref 80.0–100.0)
MCV: 100.3 fL — ABNORMAL HIGH (ref 80.0–100.0)
Platelets: 292 10*3/uL (ref 150–400)
Platelets: 331 10*3/uL (ref 150–400)
RBC: 2.84 MIL/uL — ABNORMAL LOW (ref 4.22–5.81)
RBC: 3.36 MIL/uL — ABNORMAL LOW (ref 4.22–5.81)
RDW: 14.4 % (ref 11.5–15.5)
RDW: 14 % (ref 11.5–15.5)
WBC: 9.2 10*3/uL (ref 4.0–10.5)
WBC: 11.5 10*3/uL — ABNORMAL HIGH (ref 4.0–10.5)
nRBC: 0 % (ref 0.0–0.2)
nRBC: 0 % (ref 0.0–0.2)

## 2019-07-04 LAB — COOXEMETRY PANEL
Carboxyhemoglobin: 1.2 % (ref 0.5–1.5)
Methemoglobin: 0.9 % (ref 0.0–1.5)
O2 Saturation: 58.1 %
Total hemoglobin: 16.8 g/dL — ABNORMAL HIGH (ref 12.0–16.0)

## 2019-07-04 LAB — MAGNESIUM: Magnesium: 2.3 mg/dL (ref 1.7–2.4)

## 2019-07-04 LAB — PROTIME-INR
INR: 3.1 — ABNORMAL HIGH (ref 0.8–1.2)
INR: 2.1 — ABNORMAL HIGH (ref 0.8–1.2)
Prothrombin Time: 31.1 seconds — ABNORMAL HIGH (ref 11.4–15.2)
Prothrombin Time: 22.8 seconds — ABNORMAL HIGH (ref 11.4–15.2)

## 2019-07-04 LAB — BASIC METABOLIC PANEL
Anion gap: 12 (ref 5–15)
BUN: 12 mg/dL (ref 6–20)
CO2: 25 mmol/L (ref 22–32)
Calcium: 8.4 mg/dL — ABNORMAL LOW (ref 8.9–10.3)
Chloride: 96 mmol/L — ABNORMAL LOW (ref 98–111)
Creatinine, Ser: 1 mg/dL (ref 0.61–1.24)
GFR calc Af Amer: >60 mL/min (ref >60)
GFR calc non Af Amer: >60 mL/min (ref >60)
Glucose, Bld: 209 mg/dL — ABNORMAL HIGH (ref 70–99)
Potassium: 3.8 mmol/L (ref 3.5–5.1)
Sodium: 133 mmol/L — ABNORMAL LOW (ref 135–145)

## 2019-07-04 LAB — GLUCOSE, CAPILLARY
Glucose-Capillary: 134 mg/dL — ABNORMAL HIGH (ref 70–99)
Glucose-Capillary: 153 mg/dL — ABNORMAL HIGH (ref 70–99)
Glucose-Capillary: 133 mg/dL — ABNORMAL HIGH (ref 70–99)
Glucose-Capillary: 103 mg/dL — ABNORMAL HIGH (ref 70–99)

## 2019-07-04 LAB — LIDOCAINE LEVEL: Lidocaine Lvl: 5.2 ug/mL — ABNORMAL HIGH (ref 1.5–5.0)

## 2019-07-04 LAB — SODIUM: Sodium: 132 mmol/L — ABNORMAL LOW (ref 135–145)

## 2019-07-04 LAB — HEPARIN LEVEL (UNFRACTIONATED): Heparin Unfractionated: 0.23 IU/mL — ABNORMAL LOW (ref 0.30–0.70)

## 2019-07-04 LAB — LACTATE DEHYDROGENASE: LDH: 476 U/L — ABNORMAL HIGH (ref 98–192)

## 2019-07-04 MED ORDER — AMIODARONE HCL IN DEXTROSE 360-4.14 MG/200ML-% IV SOLN
30.0000 mg/h | INTRAVENOUS | Status: DC
  Administered 2019-07-05 – 2019-07-07 (×5): 30 mg/h via INTRAVENOUS
  Administered 2019-07-07 – 2019-07-13 (×28): 60 mg/h via INTRAVENOUS
  Administered 2019-07-14: 30 mg/h via INTRAVENOUS
  Administered 2019-07-14: 60 mg/h via INTRAVENOUS
  Administered 2019-07-15 – 2019-07-16 (×3): 30 mg/h via INTRAVENOUS
  Filled 2019-07-04 (×29): qty 200
  Filled 2019-07-04: qty 400
  Filled 2019-07-04 (×7): qty 200

## 2019-07-04 MED ORDER — AMIODARONE HCL IN DEXTROSE 360-4.14 MG/200ML-% IV SOLN
60.0000 mg/h | INTRAVENOUS | Status: DC
  Filled 2019-07-04: qty 200

## 2019-07-04 MED ORDER — AMIODARONE HCL IN DEXTROSE 360-4.14 MG/200ML-% IV SOLN: 60.0000 mg/h | INTRAVENOUS | Status: AC

## 2019-07-04 MED ORDER — FENTANYL CITRATE (PF) 100 MCG/2ML IJ SOLN
INTRAMUSCULAR | Status: AC
  Filled 2019-07-04: qty 2

## 2019-07-04 MED ORDER — AMIODARONE IV BOLUS ONLY 150 MG/100ML
150.0000 mg | Freq: Once | INTRAVENOUS | Status: AC
  Administered 2019-07-04: 150 mg via INTRAVENOUS

## 2019-07-04 MED ORDER — MIDAZOLAM HCL 2 MG/2ML IJ SOLN
INTRAMUSCULAR | Status: AC
  Filled 2019-07-04: qty 6

## 2019-07-04 MED ORDER — WARFARIN SODIUM 2 MG PO TABS
4.0000 mg | ORAL_TABLET | Freq: Once | ORAL | Status: AC
  Administered 2019-07-04: 4 mg via ORAL
  Filled 2019-07-04: qty 2

## 2019-07-04 MED ORDER — ASPIRIN EC 81 MG PO TBEC
81.0000 mg | DELAYED_RELEASE_TABLET | Freq: Every day | ORAL | Status: DC
  Administered 2019-07-05 – 2019-07-10 (×6): 81 mg via ORAL
  Filled 2019-07-04 (×6): qty 1

## 2019-07-04 MED ORDER — AMIODARONE HCL IN DEXTROSE 360-4.14 MG/200ML-% IV SOLN
30.0000 mg/h | INTRAVENOUS | Status: DC
  Administered 2019-07-04: 60 mg/h via INTRAVENOUS

## 2019-07-04 MED ORDER — AMIODARONE LOAD VIA INFUSION
150.0000 mg | Freq: Once | INTRAVENOUS | Status: AC
  Administered 2019-07-04: 150 mg via INTRAVENOUS
  Filled 2019-07-04: qty 83.34

## 2019-07-04 MED ORDER — MIDAZOLAM HCL 2 MG/2ML IJ SOLN
6.0000 mg | Freq: Once | INTRAMUSCULAR | Status: AC
  Administered 2019-07-04: 6 mg via INTRAVENOUS

## 2019-07-04 MED ORDER — POTASSIUM CHLORIDE CRYS ER 20 MEQ PO TBCR
30.0000 meq | EXTENDED_RELEASE_TABLET | Freq: Once | ORAL | Status: AC
  Administered 2019-07-04: 30 meq via ORAL
  Filled 2019-07-04: qty 1

## 2019-07-04 MED ORDER — FENTANYL CITRATE (PF) 100 MCG/2ML IJ SOLN
100.0000 ug | Freq: Once | INTRAMUSCULAR | Status: AC
  Administered 2019-07-04: 100 ug via INTRAVENOUS

## 2019-07-04 MED ORDER — RANOLAZINE ER 500 MG PO TB12
500.0000 mg | ORAL_TABLET | Freq: Two times a day (BID) | ORAL | Status: DC
  Administered 2019-07-04 – 2019-07-10 (×11): 500 mg via ORAL
  Filled 2019-07-04 (×11): qty 1

## 2019-07-04 MED ORDER — AMIODARONE IV BOLUS ONLY 150 MG/100ML
150.0000 mg | Freq: Once | INTRAVENOUS | Status: DC
  Administered 2019-07-04: 150 mg via INTRAVENOUS

## 2019-07-04 NOTE — Progress Notes (Signed)
[facility] for IV heparin, Coumadin Indication: LVAD  Allergies  Allergen Reactions  . Codeine Hives    Patient Measurements: Height: 5\' 9"  (175.3 cm) Weight: 94.5 kg (208 lb 5.4 oz) IBW/kg (Calculated) : 70.7 Heparin Dosing Weight: ~ 92 kg  Vital Signs: Temp: 98.1 F (36.7 C) (07/03 0645) Temp Source: Oral (07/03 0645) BP: 109/66 (07/03 0700) Pulse Rate: 44 (07/03 0700)  Labs: Recent Labs    07/02/19 0531 07/02/19 7858 07/03/19 0332 07/03/19 0332 07/03/19 0950 07/04/19 0231 07/04/19 0233 07/04/19 0401 07/04/19 0428  HGB 11.1*   < > 10.5*   < >  --   --  8.9*  --  10.7*  HCT 33.2*   < > 31.6*  --   --   --  30.5*  --  33.7*  PLT 307   < > 323  --   --   --  292  --  331  LABPROT 17.3*   < > 19.1*  --   --   --  31.1*  --  22.8*  INR 1.5*   < > 1.7*  --   --   --  3.1*  --  2.1*  HEPARINUNFRC 0.29*  --  0.19*  --   --  0.23*  --   --   --   CREATININE 1.00   < > 0.94  --  0.94  --   --  1.00  --    < > = values in this interval not displayed.    Estimated Creatinine Clearance: 94.7 mL/min (by C-G formula based on SCr of 1 mg/dL).   Medical History: Past Medical History:  Diagnosis Date  . Acute anterolateral wall MI ([place])   . CAD, multiple vessel   . Hypertension   . Ischemic cardiomyopathy   . Systolic heart failure ([place])   . Tobacco abuse     Medications:  Infusions:  . sodium chloride    . amiodarone 30 mg/hr (07/04/19 0700)  . heparin 1,100 Units/hr (07/04/19 0700)  . lactated ringers    . lidocaine 1 mg/min (07/04/19 0700)  . milrinone Stopped (07/02/19 2130)  . vasopressin (PITRESSIN) infusion - *FOR SHOCK*      Assessment: 54 yo male s/p LVAD HM3 placement 6/24.  Pharmacy asked to dose  anticoagulation with IV heparin and Coumadin.  No overt bleeding or complications noted.  Hgb low but stable post-op.  Heparin level is therapeutic at 0.23, on 1100 units/hr. INR today is therapeutic at 2.1. Hgb 10.7, plt  331. No s/sx of bleeding overnight. LDH down slightly to 476.  Goal of Therapy:  INR 2-2.5  HL goal about 0.2  Monitor platelets by anticoagulation protocol: Yes   Plan:  -Discontinue heparin infusion given therapeutic INR -Warfarin 4 mg PO x1  tonight -Daily INR, CBC, LDH  Antonietta Jewel, PharmD, [place] Clinical Pharmacist  Phone: [phone] 07/04/2019 8:01 AM  Please check AMION for all [place] phone numbers After 10:00 PM, call [place] [phone]

## 2019-07-04 NOTE — CV Procedure (Signed)
    DIRECT CURRENT CARDIOVERSION  NAME:  Aaron [name]   MRN: [id number] DOB:  February 03, 1965   ADMIT DATE: 06/17/2019   INDICATIONS: Ventricular fibrillation    PROCEDURE:   Developed recurrent VF/[place] around 2am with drop in flows on LVAD.  Patient became progressively dizzy and apprehensive,   RT in room.  I gave him a total of versed 6mg  and fentanyl 149mcg with blow-by bag mask support. When patient was moderately sedately. I performed emergent cardioversion at 200J with prompt conversion to NSR and improved hemodynamics and VAD flows.    Glori Bickers, MD  3:25 AM

## 2019-07-04 NOTE — Progress Notes (Signed)
  Drive Line: 07/01/25 8718  Existing VAD dressing removed and site care performed using sterile technique.  Drive line site cleaned with Chlora prep applicator x2, allowed to dry, and gauze dressing with silver strip re-applied.  Exit site with suture remaining, the velour is fully implanted at the exit site. No tissue ingrowth noted. Moderate amount of redness noted at exit site. Minimal fatty necrosis drainage noted on dressing, no tenderness, foul odor or rash noted. Drive line anchor changed.    Next LVAD dressing change due 07/05/19

## 2019-07-04 NOTE — Progress Notes (Signed)
Short runs of VT this afternoon but mostly stable today.  No new concerns.  Continue current therapy.  EP to follow  Thompson Grayer MD, [place] 07/04/2019 5:11 PM

## 2019-07-04 NOTE — Consult Note (Signed)
ELECTROPHYSIOLOGY CONSULT NOTE    Primary Care Physician: Patient, No Pcp Per Referring Physician:  Dr Haroldine Laws  Admit Date: 06/17/2019  Reason for consultation:  VT/VF  Aaron [name] is a 54 y.o. male with a h/o CAD, HTN and systolic dysfunction who continues to have refractory ventricular arrhythmias.  He presented 06/18/19 with acute anterolateral STEMI.  He underwent emergent cath and was found to have multivessel CAD.  PCI of the LAD was unsuccessful.  He required IABP and subsequent VAD for cardiogenic shock.   There were no targets for CABG. He has developed subsequent refractory ventricular arrhythmias.  He has had both monomorphic VT as well as VF.  He has frequent ventricular ectopy with couplets in bigeminy despite IV amiodarone and lidocaine.  Milrinone has been discontinued.  He has required emergent defibrillation multiple times overnight as outlined in Dr Bensimhon's note from this am (personally reviewed).   Past Medical History:  Diagnosis Date  . Acute anterolateral wall MI ([place])   . CAD, multiple vessel   . Hypertension   . Ischemic cardiomyopathy   . Systolic heart failure ([place])   . Tobacco abuse    Past Surgical History:  Procedure Laterality Date  . CARDIOVERSION N/A 07/02/2019   Procedure: CARDIOVERSION;  Surgeon: Jolaine Artist, MD;  Location: [facility] OR;  Service: Cardiovascular;  Laterality: N/A;  . CORONARY/GRAFT ACUTE MI REVASCULARIZATION N/A 06/17/2019   Procedure: Coronary/Graft Acute MI Revascularization;  Surgeon: Yolonda Kida, MD;  Location: [place] [facility];  Service: Cardiovascular;  Laterality: N/A;  . IABP INSERTION N/A 06/17/2019   Procedure: IABP Insertion;  Surgeon: Yolonda Kida, MD;  Location: [place] [facility];  Service: Cardiovascular;  Laterality: N/A;  . IABP INSERTION N/A 06/24/2019   Procedure: IABP INSERTION;  Surgeon: Jolaine Artist, MD;  Location: [place] [facility];  Service: Cardiovascular;  Laterality: N/A;  .  INSERTION OF IMPLANTABLE LEFT VENTRICULAR ASSIST DEVICE N/A 06/25/2019   Procedure: INSERTION OF IMPLANTABLE LEFT VENTRICULAR ASSIST DEVICE USING THORATEC HEARTMATE 3;  Surgeon: Wonda Olds, MD;  Location: [place];  Service: Open Heart Surgery;  Laterality: N/A;  . LEFT HEART CATH AND CORONARY ANGIOGRAPHY N/A 06/17/2019   Procedure: LEFT HEART CATH AND CORONARY ANGIOGRAPHY;  Surgeon: Yolonda Kida, MD;  Location: [place] [facility];  Service: Cardiovascular;  Laterality: N/A;  . RIGHT HEART CATH N/A 06/17/2019   Procedure: RIGHT HEART CATH;  Surgeon: Yolonda Kida, MD;  Location: [place] [facility];  Service: Cardiovascular;  Laterality: N/A;  . RIGHT HEART CATH N/A 06/24/2019   Procedure: RIGHT HEART CATH;  Surgeon: Jolaine Artist, MD;  Location: [place] [facility];  Service: Cardiovascular;  Laterality: N/A;  . TEE WITHOUT CARDIOVERSION N/A 06/25/2019   Procedure: TRANSESOPHAGEAL ECHOCARDIOGRAM (TEE);  Surgeon: Wonda Olds, MD;  Location: [place];  Service: Open Heart Surgery;  Laterality: N/A;    . [START ON 07/05/2019] aspirin EC  81 mg Oral Daily  . atorvastatin  80 mg Oral Daily  . bisacodyl  10 mg Oral Daily   Or  . bisacodyl  10 mg Rectal Daily  . Chlorhexidine Gluconate Cloth  6 each Topical Daily  . clonazePAM  0.5 mg Oral BID  . colchicine  0.6 mg Oral BID  . digoxin  0.125 mg Oral Daily  . docusate sodium  200 mg Oral Daily  . doxycycline  100 mg Oral Q12H  . folic acid  1 mg Oral Daily  .  gabapentin  300 mg Oral TID  . insulin aspart  0-15 Units Subcutaneous TID WC  . mouth rinse  15 mL Mouth Rinse BID  . multivitamin with minerals  1 tablet Oral Daily  . pantoprazole  40 mg Oral Daily  . ranolazine  500 mg Oral BID  . sildenafil  20 mg Oral TID  . sodium chloride flush  10-40 mL Intracatheter Q12H  . spironolactone  12.5 mg Oral Daily  . thiamine  100 mg Oral Daily  . warfarin  4 mg Oral ONCE-1600  . Warfarin - Pharmacist Dosing Inpatient   Does  not apply q1600   . sodium chloride    . amiodarone 60 mg/hr (07/04/19 0918)  . amiodarone    . lactated ringers    . lidocaine 1 mg/min (07/04/19 1000)    Allergies  Allergen Reactions  . Codeine Hives    Social History   Socioeconomic History  . Marital status: Married    Spouse name: Not on file  . Number of children: Not on file  . Years of education: Not on file  . Highest education level: Not on file  Occupational History  . Not on file  Tobacco Use  . Smoking status: Current Every Day Smoker    Packs/day: 1.50    Years: 30.00    Pack years: 45.00  . Smokeless tobacco: Never Used  Substance and Sexual Activity  . Alcohol use: Yes    Alcohol/week: 7.0 - 14.0 standard drinks    Types: 7 - 14 Standard drinks or equivalent per week  . Drug use: Never  . Sexual activity: Not on file  Other Topics Concern  . Not on file  Social History Narrative  . Not on file   Social Determinants of Health   Financial Resource Strain:   . Difficulty of Paying Living Expenses:   Food Insecurity:   . Worried About Charity fundraiser in the Last Year:   . Arboriculturist in the Last Year:   Transportation Needs:   . Film/video editor (Medical):   Marland Kitchen Lack of Transportation (Non-Medical):   Physical Activity:   . Days of Exercise per Week:   . Minutes of Exercise per Session:   Stress:   . Feeling of Stress :   Social Connections:   . Frequency of Communication with Friends and Family:   . Frequency of Social Gatherings with Friends and Family:   . Attends Religious Services:   . Active Member of Clubs or Organizations:   . Attends Archivist Meetings:   Marland Kitchen Marital Status:   Intimate Partner Violence:   . Fear of Current or Ex-Partner:   . Emotionally Abused:   Marland Kitchen Physically Abused:   . Sexually Abused:     Family History  Problem Relation Age of Onset  . CAD Father   . Valvular heart disease Father     ROS- All systems are reviewed and negative  except as per the HPI above  Physical Exam: Telemetry:  Sinus,  Frequent ventricular ectopy is noted with couplets and triplets in bigeminy,  Both monomorphic VT and VF observed. Vitals:   07/04/19 0745 07/04/19 0800 07/04/19 0900 07/04/19 1000  BP:      Pulse:   65 65  Resp:  13 13 (!) 23  Temp:      TempSrc:      SpO2: 99% 100% 98% 100%  Weight:      Height:  GEN- The patient is well appearing, alert and oriented x 3 today.   Head- normocephalic, atraumatic Eyes-  Sclera clear, conjunctiva pink Ears- hearing intact Oropharynx- clear Neck- supple,   Lungs-   normal work of breathing Heart- Regular rate and rhythm  GI- soft  Extremities- no clubbing, cyanosis, or edema MS- no significant deformity or atrophy Skin- no rash or lesion Psych- euthymic mood, full affect Neuro- strength and sensation are intact  EKG-  Sinus with IVCD and anterolateral infarct pattern  Labs:   Lab Results  Component Value Date   WBC 11.5 (H) 07/04/2019   HGB 10.7 (L) 07/04/2019   HCT 33.7 (L) 07/04/2019   MCV 100.3 (H) 07/04/2019   PLT 331 07/04/2019    Recent Labs  Lab 06/28/19 0400 06/29/19 0616 07/04/19 0401  NA 132*   < > 133*  K 3.4*   < > 3.8  CL 95*   < > 96*  CO2 28   < > 25  BUN 11   < > 12  CREATININE 1.13   < > 1.00  CALCIUM 7.8*   < > 8.4*  PROT 5.5*  --   --   BILITOT 0.7  --   --   ALKPHOS 53  --   --   ALT 146*  --   --   AST 90*  --   --   GLUCOSE 130*   < > 209*   < > = values in this interval not displayed.   No results found for: CKTOTAL, CKMB, CKMBINDEX, TROPONINI  Lab Results  Component Value Date   CHOL 196 06/19/2019     Echo:  reviewed All ekgs in epic reviewed  ASSESSMENT AND PLAN:   1. Refractory ventricular arrhythmias/ cardiogenic shock/ CAD/ MI The patient has (likely) ischemic driven VT/VF which has been refractory to IV amiodarone and lidocaine.  I have reviewed his telemetry at length and discussed with Dr Haroldine Laws.  There are  no ekgs of his VT to localize or better characterize this arrhythmia.  I have asked nursing to obtain an EKG if further VT occurs. Dr Haroldine Laws has reduced VAD flow rates in order to make sure that arrhythmias are not secondary to VAD function.   I have given an additional IV bolus of amiodarone and increased amiodarone rate to 60 mg/min x 6 hours.  Ranolazine has also been added. We may need to increase lidocaine if he has further arrhythmias. Ideally, we would reduce myocardial consumption as his arrhythmia may be ischemic in nature.  Unfortunately, given cardiogenic shock, beta blockers are not a good option at this time.  If his ventricular arrhythmias worsen, we could sedate the patient and also consider IABP. This patient is young and very viable.  Hopefully we can get him through this critical time.  His prognosis is guarded. I am available over the weekend.     Critical care time was exclusive of separate billable procedures and treating other patients.  Critial care time was spent personally by me (independant of midlevel providers) on the following activities: development of treatment plan with patient as well as nursing, discussions with Dr Haroldine Laws, evaluation of patients response to treatment, examining patient, obtaining history from patient, ordering/ reviewing treatments/ interventions, lab studies, radiographic studies, pulse ox, and re-evaluation of patients condition.     The patient is critically ill with multiple organ systems failure and requires high complexity decision making for assessment and support, frequent evaluation and titration of therapies, application  of advanced monitoring technologies and extensive interpretation of databases.   Critical care was necessary to treat or prevent immintent or life-threatening deterioration.   Total CCT spent directly with the patient today is 45 minutes  Thompson Grayer MD, [facility] 07/04/2019 10:59 AM

## 2019-07-04 NOTE — Progress Notes (Addendum)
Patient ID: Aaron [name], male   DOB: 1965/01/27, 54 y.o.   MRN: [id number]     Advanced Heart Failure Rounding Note  PCP-Cardiologist: No primary care provider on file.   Subjective:    HM3 placement 6/24   Had 2 episodes of VF requiring emergent DC-CV on 07/02/19 Recurrent VF last night and broke spontaneously.   At 2a developed recurrent VF which persisted with decrease in VAD flows and increasing apprehension. Underwent emergent DC-CV.   Off milrinone.   On amio and lido at 1. Still with some pocket pain. Diuresed well with tolvaptan yesterday. Labs pending this am. On heparin. No bleeding. CVP was 10 prior to VF   LVAD Interrogation HM III: Speed: 5100 Flow: 3.1  PI: 2.3 Power: 3.2   Prior to DC-CV LVAD interrogated personally. Parameters stable.  Speed: 5100 Flow: 3.8  PI: 5.4 Power: 3.4  Post DC-CV   Objective:   Weight Range: 93 kg Body mass index is 30.28 kg/m.   Vital Signs:   Temp:  [97.5 F (36.4 C)-98.4 F (36.9 C)] 97.5 F (36.4 C) (07/02 2300) Pulse Rate:  [25-129] 102 (07/03 0230) Resp:  [13-30] 19 (07/03 0240) BP: (64-120)/(46-98) 114/89 (07/03 0225) SpO2:  [64 %-100 %] 98 % (07/03 0230) Weight:  [93 kg] 93 kg (07/02 0500) Last BM Date: 07/03/19  Weight change: Filed Weights   07/01/19 0600 07/02/19 0604 07/03/19 0500  Weight: 96.8 kg 92.5 kg 93 kg    Intake/Output:   Intake/Output Summary (Last 24 hours) at 07/04/2019 0326 Last data filed at 07/04/2019 0300 Gross per 24 hour  Intake 1625.74 ml  Output 2065 ml  Net -439.26 ml      Physical Exam   MAPS 80-100  General: Apprehensive in setting of VF HEENT: normal  Neck: supple. JVP to jaw.  Carotids 2+ bilat; no bruits. No lymphadenopathy or thryomegaly appreciated. Cor: LVAD hum.  Incision ok  Lungs: Clear. Abdomen: obese soft, nontender, non-distended. No hepatosplenomegaly. No bruits or masses. Good bowel sounds. Driveline site clean. Anchor in place.  Extremities: no cyanosis,  clubbing, rash.Coot trace edema  Neuro: alert & oriented x 3. No focal deficits. Moves all 4 without problem   Telemetry   VF -> sinus 70s Personally reviewed   Labs    CBC Recent Labs    07/02/19 0531 07/02/19 0531 07/02/19 0655 07/03/19 0332  WBC 18.3*  --   --  14.9*  HGB 11.1*   < > 11.9* 10.5*  HCT 33.2*   < > 35.0* 31.6*  MCV 96.0  --   --  96.6  PLT 307  --   --  323   < > = values in this interval not displayed.   Basic Metabolic Panel Recent Labs    07/01/19 0500 07/01/19 0500 07/02/19 0531 07/02/19 0655 07/03/19 0332 07/03/19 0332 07/03/19 0950 07/03/19 0950 07/03/19 1539 07/04/19 0005  NA 130*   < > 127*   < > 124*   < > 126*   < > 129* 132*  K 3.5   < > 2.9*   < > 3.7  --  4.0  --   --   --   CL 94*   < > 84*   < > 86*  --  89*  --   --   --   CO2 27   < > 30   < > 25  --  26  --   --   --  GLUCOSE 120*   < > 130*   < > 269*  --  253*  --   --   --   BUN 11   < > 10   < > 14  --  13  --   --   --   CREATININE 1.01   < > 1.00   < > 0.94  --  0.94  --   --   --   CALCIUM 8.3*   < > 8.9   < > 8.2*  --  8.4*  --   --   --   MG 2.0   < > 1.9  --  2.4  --   --   --   --   --   PHOS 4.2  --  4.1  --   --   --   --   --   --   --    < > = values in this interval not displayed.   Liver Function Tests No results for input(s): AST, ALT, ALKPHOS, BILITOT, PROT, ALBUMIN in the last 72 hours. No results for input(s): LIPASE, AMYLASE in the last 72 hours. Cardiac Enzymes No results for input(s): CKTOTAL, CKMB, CKMBINDEX, TROPONINI in the last 72 hours.  BNP: BNP (last 3 results) Recent Labs    06/18/19 0119 06/26/19 0445 07/02/19 0531  BNP 1,068.5* 1,301.1* 1,646.4*    ProBNP (last 3 results) No results for input(s): PROBNP in the last 8760 hours.   D-Dimer No results for input(s): DDIMER in the last 72 hours. Hemoglobin A1C No results for input(s): HGBA1C in the last 72 hours. Fasting Lipid Panel No results for input(s): CHOL, HDL, LDLCALC,  TRIG, CHOLHDL, LDLDIRECT in the last 72 hours. Thyroid Function Tests No results for input(s): TSH, T4TOTAL, T3FREE, THYROIDAB in the last 72 hours.  Invalid input(s): FREET3  Other results:   Imaging    No results found.   Medications:     Scheduled Medications: . aspirin EC  325 mg Oral Daily   Or  . aspirin  324 mg Per Tube Daily  . atorvastatin  80 mg Oral Daily  . bisacodyl  10 mg Oral Daily   Or  . bisacodyl  10 mg Rectal Daily  . Chlorhexidine Gluconate Cloth  6 each Topical Daily  . clonazePAM  0.5 mg Oral BID  . colchicine  0.6 mg Oral BID  . digoxin  0.125 mg Oral Daily  . docusate sodium  200 mg Oral Daily  . doxycycline  100 mg Oral Q12H  . fentaNYL (SUBLIMAZE) injection  100 mcg Intravenous Once  . folic acid  1 mg Oral Daily  . gabapentin  300 mg Oral TID  . insulin aspart  0-15 Units Subcutaneous TID WC  . mouth rinse  15 mL Mouth Rinse BID  . midazolam  6 mg Intravenous Once  . multivitamin with minerals  1 tablet Oral Daily  . pantoprazole  40 mg Oral Daily  . sildenafil  20 mg Oral TID  . sodium chloride flush  10-40 mL Intracatheter Q12H  . spironolactone  12.5 mg Oral Daily  . thiamine  100 mg Oral Daily  . Warfarin - Pharmacist Dosing Inpatient   Does not apply q1600    Infusions: . sodium chloride    . amiodarone 30 mg/hr (07/04/19 0300)  . heparin 1,100 Units/hr (07/04/19 0300)  . lactated ringers    . lidocaine 1 mg/min (07/04/19 0300)  . milrinone Stopped (07/02/19 2130)  .  vasopressin (PITRESSIN) infusion - *FOR SHOCK*      PRN Medications: acetaminophen, ALPRAZolam, bisacodyl, calcium carbonate, dextrose, fentaNYL (SUBLIMAZE) injection, morphine injection, ondansetron (ZOFRAN) IV, oxyCODONE, pneumococcal 23 valent vaccine, sodium chloride flush, sodium chloride flush, traMADol, traZODone    Patient Profile   Aaron [name] --post MI cardiogenic shock/ optimization prior to anticipated CABG, at the request of Dr. Marlou Porch,  Cardiology.    Assessment/Plan   1. Sustained VT/VF  - developed VF on 7/1 x 2 requiring emergent DC-CV - On 7/2 had self-terminating episode - On 7/3 persistent VF/torsades -> DC-CV - suspect ischemically-mediated  - continue amio and lido.Will add Ranexa. Watch QT - keep K> 4.0 Mg >2.0 .  - Keep milrinone off for now despite low co-ox - VAD speed reduced to 5000 - Will d/w EP  2. Acute Anterolateral STEMI/ Multivessel CAD - Emergent cath showed severe multivessel disease w/ 75% mid- distal LM, 100% prox LAD, not amendable to PCI (unable to cross w/ guide wire, probable CTO), 100% prox LCx (probable CTO) and 100% ostial RCA (probable CTO). There were mild to moderate collaterals right to left to the LAD, minimal right to right collaterals an minimal left to left collaterals. LVEF 25% - Hs trop peaked 27,000. - No targets for CABG / PCI.   - S/P HMIII VAD - suspect VF is ischemically-mediated. Start Ranexa as above. Watch QT.   3.  Acute Systolic Heart Failure --->Cardiogenic Shock - 2/2 acute MI. LVEF by LVG 25% - IABP out 6/18 but replaced 6/23. Removed in OR on 6/24  - HMIII LVAD  Placed 6/24 - Extubated 6/25  - off milrinone due to VF. Will not restart despite co-ox 46% . Recheck co-ox today - Continue digoxin.  - On low dose heparin gtt today + warfarin. INR pending  - LDH up from 347>>459>463>517>554-> pending. May be related to DC-CV - CVP 10 this am. Improved with tolvaptan. Will not diurese this am - Continue 12.5 mg spironolactone daily.   - Continue gabapentin 300 mg tid for pocket pain + back-up fentanyl - Ramp ECHO soon.  - VAD interrogated personally. Parameters stable.  4. Tobacco Abuse - 30 pack years  - smoking cessation advised  - Will need to quit if to be considered for heart transplant in the future   5. Renal mass = RCC - MRI abdomen- 1.8 cm right renal lesion, most consistent with renal cell carcinoma. No evidence of renal vein involvement or  nodal metastasis. - Discussed w/ Urology and R RCC is small and will just require surveillance.  Ok to proceed with VAD from their standpoint.   8. Hyponatremia  -Sodium 124. Restrict free water. Give tolvaptan  9. Sleep Apnea - frequent apnea at night  - needs formal sleep study   - CPAP ordered while inpatient  10. Hypokalemia - BMET pending  11. Hyponatremia - improved with tolvaptan  CRITICAL CARE Performed by: Glori Bickers  Total critical care time: 40 minutes  Critical care time was exclusive of separately billable procedures and treating other patients.  Critical care was necessary to treat or prevent imminent or life-threatening deterioration.  Critical care was time spent personally by me (independent of midlevel providers or residents) on the following activities: development of treatment plan with patient and/or surrogate as well as nursing, discussions with consultants, evaluation of patient's response to treatment, examination of patient, obtaining history from patient or surrogate, ordering and performing treatments and interventions, ordering and review of laboratory studies, ordering  and review of radiographic studies, pulse oximetry and re-evaluation of patient's condition.   Glori Bickers MD 07/04/2019 3:26 AM

## 2019-07-04 NOTE — Progress Notes (Signed)
2 Days Post-Op Procedure(s) (LRB): CARDIOVERSION (N/A) Subjective: Short runs of VT continue after needing DC cardioversion at 3 AM for sustained symptomatic VT VAD parameters remain satisfactory Surgical incisions clean and dry Objective: Vital signs in last 24 hours: Temp:  [97.5 F (36.4 C)-98.5 F (36.9 C)] 98.5 F (36.9 C) (07/03 1103) Pulse Rate:  [25-107] 72 (07/03 1300) Cardiac Rhythm: Normal sinus rhythm (07/03 1200) Resp:  [13-30] 23 (07/03 1300) BP: (72-120)/(56-98) 101/79 (07/03 1300) SpO2:  [64 %-100 %] 94 % (07/03 1300) Weight:  [94.5 kg] 94.5 kg (07/03 0500)  Hemodynamic parameters for last 24 hours:    Intake/Output from previous day: 07/02 0701 - 07/03 0700 In: 2588.5 [P.O.:480; I.V.:2108.5] Out: 2615 [Urine:2615] Intake/Output this shift: Total I/O In: 697.2 [I.V.:697.2] Out: 625 [Urine:625]  Comfortable laying in bed Neuro intact Lungs clear Normal VAD hum  Lab Results: Recent Labs    07/04/19 0233 07/04/19 0428  WBC 9.2 11.5*  HGB 8.9* 10.7*  HCT 30.5* 33.7*  PLT 292 331   BMET:  Recent Labs    07/03/19 0950 07/03/19 1539 07/04/19 0005 07/04/19 0401  NA 126*   < > 132* 133*  K 4.0  --   --  3.8  CL 89*  --   --  96*  CO2 26  --   --  25  GLUCOSE 253*  --   --  209*  BUN 13  --   --  12  CREATININE 0.94  --   --  1.00  CALCIUM 8.4*  --   --  8.4*   < > = values in this interval not displayed.    PT/INR:  Recent Labs    07/04/19 0428  LABPROT 22.8*  INR 2.1*   ABG    Component Value Date/Time   PHART 7.475 (H) 06/27/2019 0437   HCO3 25.8 06/27/2019 0437   TCO2 28 07/02/2019 0655   ACIDBASEDEF 1.0 06/26/2019 0838   O2SAT 58.1 07/04/2019 0340   CBG (last 3)  Recent Labs    07/03/19 2142 07/04/19 0641 07/04/19 1101  GLUCAP 155* 134* 153*    Assessment/Plan: S/P Procedure(s) (LRB): CARDIOVERSION (N/A) Short runs of VT-continue IV lidocaine and amiodarone   LOS: 17 days    Aaron [name] 07/04/2019

## 2019-07-05 LAB — COOXEMETRY PANEL
Carboxyhemoglobin: 0.9 % (ref 0.5–1.5)
Methemoglobin: 0.6 % (ref 0.0–1.5)
O2 Saturation: 57.5 %
Total hemoglobin: 17.6 g/dL — ABNORMAL HIGH (ref 12.0–16.0)

## 2019-07-05 LAB — BASIC METABOLIC PANEL
Anion gap: 8 (ref 5–15)
BUN: 10 mg/dL (ref 6–20)
CO2: 27 mmol/L (ref 22–32)
Calcium: 8.3 mg/dL — ABNORMAL LOW (ref 8.9–10.3)
Chloride: 96 mmol/L — ABNORMAL LOW (ref 98–111)
Creatinine, Ser: 0.94 mg/dL (ref 0.61–1.24)
GFR calc Af Amer: >60 mL/min (ref >60)
GFR calc non Af Amer: >60 mL/min (ref >60)
Glucose, Bld: 180 mg/dL — ABNORMAL HIGH (ref 70–99)
Potassium: 4.7 mmol/L (ref 3.5–5.1)
Sodium: 131 mmol/L — ABNORMAL LOW (ref 135–145)

## 2019-07-05 LAB — CBC
HCT: 32.9 % — ABNORMAL LOW (ref 39.0–52.0)
Hemoglobin: 10.5 g/dL — ABNORMAL LOW (ref 13.0–17.0)
MCH: 32.4 pg (ref 26.0–34.0)
MCHC: 31.9 g/dL (ref 30.0–36.0)
MCV: 101.5 fL — ABNORMAL HIGH (ref 80.0–100.0)
Platelets: 340 10*3/uL (ref 150–400)
RBC: 3.24 MIL/uL — ABNORMAL LOW (ref 4.22–5.81)
RDW: 14.1 % (ref 11.5–15.5)
WBC: 12.3 10*3/uL — ABNORMAL HIGH (ref 4.0–10.5)
nRBC: 0 % (ref 0.0–0.2)

## 2019-07-05 LAB — GLUCOSE, CAPILLARY
Glucose-Capillary: 136 mg/dL — ABNORMAL HIGH (ref 70–99)
Glucose-Capillary: 98 mg/dL (ref 70–99)
Glucose-Capillary: 130 mg/dL — ABNORMAL HIGH (ref 70–99)
Glucose-Capillary: 107 mg/dL — ABNORMAL HIGH (ref 70–99)

## 2019-07-05 LAB — PROTIME-INR
INR: 2 — ABNORMAL HIGH (ref 0.8–1.2)
Prothrombin Time: 21.6 seconds — ABNORMAL HIGH (ref 11.4–15.2)

## 2019-07-05 LAB — LACTATE DEHYDROGENASE: LDH: 460 U/L — ABNORMAL HIGH (ref 98–192)

## 2019-07-05 LAB — LIDOCAINE LEVEL: Lidocaine Lvl: 26 ug/mL — ABNORMAL HIGH (ref 1.5–5.0)

## 2019-07-05 LAB — MAGNESIUM: Magnesium: 2.1 mg/dL (ref 1.7–2.4)

## 2019-07-05 MED ORDER — WARFARIN SODIUM 5 MG PO TABS
5.0000 mg | ORAL_TABLET | Freq: Once | ORAL | Status: AC
  Administered 2019-07-05: 5 mg via ORAL
  Filled 2019-07-05: qty 1

## 2019-07-05 NOTE — Progress Notes (Signed)
[place] for Coumadin Indication: LVAD  Allergies  Allergen Reactions  . Codeine Hives    Patient Measurements: Height: 5\' 9"  (175.3 cm) Weight: 91.8 kg (202 lb 6.1 oz) IBW/kg (Calculated) : 70.7 Heparin Dosing Weight: ~ 92 kg  Vital Signs: Temp: 97.6 F (36.4 C) (07/04 0740) Temp Source: Oral (07/04 0740) BP: 84/64 (07/04 0800) Pulse Rate: 50 (07/04 0830)  Labs: Recent Labs    07/03/19 0332 07/03/19 0332 07/03/19 0950 07/04/19 0231 07/04/19 0233 07/04/19 0233 07/04/19 0401 07/04/19 0428 07/05/19 0250  HGB 10.5*   < >  --   --  8.9*   < >  --  10.7* 10.5*  HCT 31.6*   < >  --   --  30.5*  --   --  33.7* 32.9*  PLT 323   < >  --   --  292  --   --  331 340  LABPROT 19.1*   < >  --   --  31.1*  --   --  22.8* 21.6*  INR 1.7*   < >  --   --  3.1*  --   --  2.1* 2.0*  HEPARINUNFRC 0.19*  --   --  0.23*  --   --   --   --   --   CREATININE 0.94   < > 0.94  --   --   --  1.00  --  0.94   < > = values in this interval not displayed.    Estimated Creatinine Clearance: 99.3 mL/min (by C-G formula based on SCr of 0.94 mg/dL).   Medical History: Past Medical History:  Diagnosis Date  . Acute anterolateral wall MI ([place])   . CAD, multiple vessel   . Hypertension   . Ischemic cardiomyopathy   . Systolic heart failure ([place])   . Tobacco abuse     Medications:  Infusions:  . sodium chloride    . amiodarone 30 mg/hr (07/05/19 0900)  . lactated ringers    . lidocaine 1 mg/min (07/05/19 0900)    Assessment: 54 yo male s/p LVAD HM3 placement 6/24.  Pharmacy asked to dose  anticoagulation with IV heparin and Coumadin.  No overt bleeding or complications noted.  Hgb low but stable post-op.  INR today is therapeutic at 2. Hgb 10.5, plt 340. No s/sx of bleeding overnight. LDH down slightly to 460.  Goal of Therapy:  INR 2-2.5  Monitor platelets by anticoagulation protocol: Yes   Plan:  -Warfarin 5 mg PO x1  tonight -Daily INR,  CBC, LDH  Antonietta Jewel, PharmD, [place] Clinical Pharmacist  Phone: [phone] 07/05/2019 9:17 AM  Please check AMION for all [place] phone numbers After 10:00 PM, call [place] [phone]

## 2019-07-05 NOTE — Progress Notes (Addendum)
3 Days Post-Op Procedure(s) (LRB): CARDIOVERSION (N/A) Subjective: Problems with VT improved- consult from EP rec to start mexiletine after lidocaine stopped Sitting up in bed VAD parameters, flow satisfactory Sternal incision clean Objective: Vital signs in last 24 hours: Temp:  [97.6 F (36.4 C)-98.4 F (36.9 C)] 98.4 F (36.9 C) (07/04 1120) Pulse Rate:  [39-92] 64 (07/04 1200) Cardiac Rhythm: Normal sinus rhythm (07/04 1200) Resp:  [13-29] 17 (07/04 1200) BP: (68-103)/(39-81) 75/63 (07/04 1200) SpO2:  [70 %-98 %] 91 % (07/04 1200) Weight:  [91.8 kg] 91.8 kg (07/04 0500)  Hemodynamic parameters for last 24 hours:    Intake/Output from previous day: 07/03 0701 - 07/04 0700 In: 2713.2 [P.O.:1440; I.V.:1273.2] Out: 1625 [Urine:1625] Intake/Output this shift: Total I/O In: 635.8 [P.O.:480; I.V.:155.8] Out: -   EXAM Lungs clear Normal VAD hum  Lab Results: Recent Labs    07/04/19 0428 07/05/19 0250  WBC 11.5* 12.3*  HGB 10.7* 10.5*  HCT 33.7* 32.9*  PLT 331 340   BMET:  Recent Labs    07/04/19 0401 07/05/19 0250  NA 133* 131*  K 3.8 4.7  CL 96* 96*  CO2 25 27  GLUCOSE 209* 180*  BUN 12 10  CREATININE 1.00 0.94  CALCIUM 8.4* 8.3*    PT/INR:  Recent Labs    07/05/19 0250  LABPROT 21.6*  INR 2.0*   ABG    Component Value Date/Time   PHART 7.475 (H) 06/27/2019 0437   HCO3 25.8 06/27/2019 0437   TCO2 28 07/02/2019 0655   ACIDBASEDEF 1.0 06/26/2019 0838   O2SAT 57.5 07/05/2019 0242   CBG (last 3)  Recent Labs    07/04/19 2102 07/05/19 0645 07/05/19 1119  GLUCAP 103* 136* 98    Assessment/Plan: S/P Procedure(s) (LRB): CARDIOVERSION (N/A) Keep in ICU while postop VT meds titrated CXR tomorrow  LOS: 18 days    Tharon Aquas Trigt III 07/05/2019

## 2019-07-05 NOTE — Progress Notes (Signed)
Progress Note   Subjective   Doing well today, the patient denies CP or SOB.  VT/VF have been quiescent overnight.  No new concerns  Inpatient Medications    Scheduled Meds: . aspirin EC  81 mg Oral Daily  . atorvastatin  80 mg Oral Daily  . bisacodyl  10 mg Oral Daily   Or  . bisacodyl  10 mg Rectal Daily  . Chlorhexidine Gluconate Cloth  6 each Topical Daily  . clonazePAM  0.5 mg Oral BID  . colchicine  0.6 mg Oral BID  . digoxin  0.125 mg Oral Daily  . docusate sodium  200 mg Oral Daily  . doxycycline  100 mg Oral Q12H  . folic acid  1 mg Oral Daily  . gabapentin  300 mg Oral TID  . insulin aspart  0-15 Units Subcutaneous TID WC  . mouth rinse  15 mL Mouth Rinse BID  . multivitamin with minerals  1 tablet Oral Daily  . pantoprazole  40 mg Oral Daily  . ranolazine  500 mg Oral BID  . sildenafil  20 mg Oral TID  . sodium chloride flush  10-40 mL Intracatheter Q12H  . spironolactone  12.5 mg Oral Daily  . thiamine  100 mg Oral Daily  . Warfarin - Pharmacist Dosing Inpatient   Does not apply q1600   Continuous Infusions: . sodium chloride    . amiodarone 30 mg/hr (07/05/19 0900)  . lactated ringers    . lidocaine 1 mg/min (07/05/19 0900)   PRN Meds: acetaminophen, ALPRAZolam, bisacodyl, calcium carbonate, dextrose, fentaNYL (SUBLIMAZE) injection, morphine injection, ondansetron (ZOFRAN) IV, oxyCODONE, pneumococcal 23 valent vaccine, sodium chloride flush, sodium chloride flush, traMADol, traZODone   Vital Signs    Vitals:   07/05/19 0740 07/05/19 0800 07/05/19 0823 07/05/19 0830  BP:  (!) 84/64    Pulse:  64 68 (!) 50  Resp:  17  20  Temp: 97.6 F (36.4 C)     TempSrc: Oral     SpO2:  92%  96%  Weight:      Height:        Intake/Output Summary (Last 24 hours) at 07/05/2019 0918 Last data filed at 07/05/2019 0900 Gross per 24 hour  Intake 2600.8 ml  Output 1625 ml  Net 975.8 ml   Filed Weights   07/03/19 0500 07/04/19 0500 07/05/19 0500  Weight: 93 kg  94.5 kg 91.8 kg    Telemetry    Sinus,  No VT/VF overnight - Personally Reviewed  Physical Exam   GEN- The patient is ill appearing, alert and oriented x 3 today.   Head- normocephalic, atraumatic Eyes-  Sclera clear, conjunctiva pink Ears- hearing intact Oropharynx- clear Neck- supple, Lungs-  normal work of breathing Heart- Regular rate and rhythm  GI- soft  Extremities- no clubbing, cyanosis, or edema  MS- no significant deformity or atrophy Skin- no rash or lesion Psych- euthymic mood, full affect Neuro- strength and sensation are intact   Labs    Chemistry Recent Labs  Lab 07/03/19 0950 07/03/19 1539 07/04/19 0005 07/04/19 0401 07/05/19 0250  NA 126*   < > 132* 133* 131*  K 4.0  --   --  3.8 4.7  CL 89*  --   --  96* 96*  CO2 26  --   --  25 27  GLUCOSE 253*  --   --  209* 180*  BUN 13  --   --  12 10  CREATININE 0.94  --   --  1.00 0.94  CALCIUM 8.4*  --   --  8.4* 8.3*  GFRNONAA >60  --   --  >60 >60  GFRAA >60  --   --  >60 >60  ANIONGAP 11  --   --  12 8   < > = values in this interval not displayed.     Hematology Recent Labs  Lab 07/04/19 0233 07/04/19 0428 07/05/19 0250  WBC 9.2 11.5* 12.3*  RBC 2.84* 3.36* 3.24*  HGB 8.9* 10.7* 10.5*  HCT 30.5* 33.7* 32.9*  MCV 107.4* 100.3* 101.5*  MCH 31.3 31.8 32.4  MCHC 29.2* 31.8 31.9  RDW 14.4 14.0 14.1  PLT 292 331 340     Patient ID  Aaron [name] is a 54 y.o. male admitted with acute MI and cardiogenic shock.  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  Now with refractory VT/VF post VAD.  Assessment & Plan    1.  Sustained VT/VF Quiescent overnight Continue lidocaine at 1mg /min today and then reduce tomorrow (unless lidocaine level warrants reduction today). Continue amiodarone 30 mg/min Consider replacing lidocaine with mexiletine in 2-3 days if stable Keep milrinone off if able Dr Haroldine Laws is managing VAD speed Electrolytes look ok  EP to follow  Critical care time was exclusive  of separate billable procedures and treating other patients.  Critial care time was spent personally by me (independant of midlevel providers) on the following activities: development of treatment plan with patient as well as nursing,  evaluation of patients response to treatment, examining patient, obtaining history from patient,  reviewing treatments/ interventions, lab studies pulse ox, and re-evaluation of patients condition.     The patient is critically ill with multiple organ systems failure and requires high complexity decision making for assessment and support, frequent evaluation and titration of therapies, application of advanced monitoring technologies and extensive interpretation of databases.   Critical care was necessary to treat or prevent immintent or life-threatening deterioration.    Total CCT spent directly with the patient today is 35 minutes  Thompson Grayer MD, [facility] 07/05/2019 9:21 AM

## 2019-07-05 NOTE — Progress Notes (Signed)
Patient ID: Aaron [name], male   DOB: Sep 14, 1965, 54 y.o.   MRN: [id number]     Advanced Heart Failure Rounding Note  PCP-Cardiologist: No primary care provider on file.   Subjective:    - HM3 placement 6/24  - Had 2 episodes of VF requiring emergent DC-CV on 07/02/19 - Recurrent VF on 7/2 and broke spontaneously.  - 7/3 Recurrent VF at 2a.-> emergent DC-CV.   Remains off milrinone. On amio and lido at 1. Ranexa started yesterday, Having some NSVT but no sustained ventricular ectopy. EP on board (thanks)  Feeling better today. Pocket pain improved  Co-ox 58%  K 4.7  LDH 460 INR 2.0   LVAD Interrogation HM III: Speed: 5100 Flow: 2.4  PI: 4.3 Power: 3.0  VAD interrogated personally      Objective:   Weight Range: 91.8 kg Body mass index is 29.89 kg/m.   Vital Signs:   Temp:  [97.6 F (36.4 C)-98.4 F (36.9 C)] 98.4 F (36.9 C) (07/04 1120) Pulse Rate:  [39-92] 50 (07/04 0830) Resp:  [13-29] 20 (07/04 0830) BP: (76-103)/(39-83) 84/64 (07/04 0800) SpO2:  [70 %-99 %] 96 % (07/04 0830) Weight:  [91.8 kg] 91.8 kg (07/04 0500) Last BM Date: 07/04/19  Weight change: Filed Weights   07/03/19 0500 07/04/19 0500 07/05/19 0500  Weight: 93 kg 94.5 kg 91.8 kg    Intake/Output:   Intake/Output Summary (Last 24 hours) at 07/05/2019 1129 Last data filed at 07/05/2019 0900 Gross per 24 hour  Intake 2338.23 ml  Output 1625 ml  Net 713.23 ml      Physical Exam   MAPS 70-80  General:  NAD.  HEENT: normal  Neck: supple. JVP not elevated.  Carotids 2+ bilat; no bruits. No lymphadenopathy or thryomegaly appreciated. Cor: LVAD hum.  Lungs: Clear. Abdomen: obese soft, nontender, non-distended. No hepatosplenomegaly. No bruits or masses. Good bowel sounds. Driveline site clean. Anchor in place.  Extremities: no cyanosis, clubbing, rash. Warm no edema  Neuro: alert & oriented x 3. No focal deficits. Moves all 4 without problem    Telemetry   Sinus 60-70s. A couple brief runs  NSVT (< 10 beats) Personally reviewed   Labs    CBC Recent Labs    07/04/19 0428 07/05/19 0250  WBC 11.5* 12.3*  HGB 10.7* 10.5*  HCT 33.7* 32.9*  MCV 100.3* 101.5*  PLT 331 956   Basic Metabolic Panel Recent Labs    07/04/19 0401 07/05/19 0250  NA 133* 131*  K 3.8 4.7  CL 96* 96*  CO2 25 27  GLUCOSE 209* 180*  BUN 12 10  CREATININE 1.00 0.94  CALCIUM 8.4* 8.3*  MG 2.3 2.1   Liver Function Tests No results for input(s): AST, ALT, ALKPHOS, BILITOT, PROT, ALBUMIN in the last 72 hours. No results for input(s): LIPASE, AMYLASE in the last 72 hours. Cardiac Enzymes No results for input(s): CKTOTAL, CKMB, CKMBINDEX, TROPONINI in the last 72 hours.  BNP: BNP (last 3 results) Recent Labs    06/18/19 0119 06/26/19 0445 07/02/19 0531  BNP 1,068.5* 1,301.1* 1,646.4*    ProBNP (last 3 results) No results for input(s): PROBNP in the last 8760 hours.   D-Dimer No results for input(s): DDIMER in the last 72 hours. Hemoglobin A1C No results for input(s): HGBA1C in the last 72 hours. Fasting Lipid Panel No results for input(s): CHOL, HDL, LDLCALC, TRIG, CHOLHDL, LDLDIRECT in the last 72 hours. Thyroid Function Tests No results for input(s): TSH, T4TOTAL, T3FREE, THYROIDAB in  the last 72 hours.  Invalid input(s): FREET3  Other results:   Imaging    No results found.   Medications:     Scheduled Medications: . aspirin EC  81 mg Oral Daily  . atorvastatin  80 mg Oral Daily  . bisacodyl  10 mg Oral Daily   Or  . bisacodyl  10 mg Rectal Daily  . Chlorhexidine Gluconate Cloth  6 each Topical Daily  . clonazePAM  0.5 mg Oral BID  . colchicine  0.6 mg Oral BID  . digoxin  0.125 mg Oral Daily  . docusate sodium  200 mg Oral Daily  . doxycycline  100 mg Oral Q12H  . folic acid  1 mg Oral Daily  . gabapentin  300 mg Oral TID  . insulin aspart  0-15 Units Subcutaneous TID WC  . mouth rinse  15 mL Mouth Rinse BID  . multivitamin with minerals  1 tablet Oral  Daily  . pantoprazole  40 mg Oral Daily  . ranolazine  500 mg Oral BID  . sildenafil  20 mg Oral TID  . sodium chloride flush  10-40 mL Intracatheter Q12H  . spironolactone  12.5 mg Oral Daily  . thiamine  100 mg Oral Daily  . warfarin  5 mg Oral ONCE-1600  . Warfarin - Pharmacist Dosing Inpatient   Does not apply q1600    Infusions: . sodium chloride    . amiodarone 30 mg/hr (07/05/19 0900)  . lactated ringers    . lidocaine 1 mg/min (07/05/19 0900)    PRN Medications: acetaminophen, ALPRAZolam, bisacodyl, calcium carbonate, dextrose, fentaNYL (SUBLIMAZE) injection, morphine injection, ondansetron (ZOFRAN) IV, oxyCODONE, pneumococcal 23 valent vaccine, sodium chloride flush, sodium chloride flush, traMADol, traZODone    Patient Profile   Amaar Oshita Broner --post MI cardiogenic shock/ optimization prior to anticipated CABG, at the request of Dr. Marlou Porch, Cardiology.    Assessment/Plan   1. Sustained VT/VF  - developed VF on 7/1 x 2 requiring emergent DC-CV - On 7/2 had self-terminating episode - On 7/3 persistent VF/torsades -> DC-CV - suspect ischemically-mediated  - Much improved overnight - continue amio and lido and Ranexa today Watch QT - Will switch lido to mexilitene tomorrow if quiescent per EP (appreciate their input) - keep K> 4.0 Mg >2.0 .  - VAD speed reduced to 5000 - Keep off inotropes  2. Acute Anterolateral STEMI/ Multivessel CAD - Emergent cath showed severe multivessel disease w/ 75% mid- distal LM, 100% prox LAD, not amendable to PCI (unable to cross w/ guide wire, probable CTO), 100% prox LCx (probable CTO) and 100% ostial RCA (probable CTO). There were mild to moderate collaterals right to left to the LAD, minimal right to right collaterals an minimal left to left collaterals. LVEF 25% - Hs trop peaked 27,000. - No targets for CABG / PCI.   - S/P HMIII VAD - suspect VF is ischemically-mediated. Ranexa seems to be helping  3.  Acute Systolic Heart  Failure --->Cardiogenic Shock - 2/2 acute MI. LVEF by LVG 25% - IABP out 6/18 but replaced 6/23. Removed in OR on 6/24  - HMIII LVAD  Placed 6/24 - Extubated 6/25  - Was on milrinone for RV failure. Now off due to VF.  - Fortunately co-ox improved at 58%. CVP 11-12 (improved) - Continue digoxin.  - INR 2.0. Off heparin  - LDH 347>>459>463>517>554->460.  - Continue 12.5 mg spironolactone daily.   - Continue gabapentin 300 mg tid for pocket pain + back-up fentanyl -  Ramp ECHO soon.  - VAD interrogated personally. Parameters stable..  4. Tobacco Abuse - 30 pack years  - smoking cessation advised  - Will need to quit if to be considered for heart transplant in the future   5. Renal mass = RCC - MRI abdomen- 1.8 cm right renal lesion, most consistent with renal cell carcinoma. No evidence of renal vein involvement or nodal metastasis. - Discussed w/ Urology and R RCC is small and will just require surveillance.  Ok to proceed with VAD from their standpoint.   8. Hyponatremia  -Sodium 131. Restrict free water. Give tolvaptan  9. Sleep Apnea - frequent apnea at night  - needs formal sleep study   - CPAP ordered while inpatient  10. Hypokalemia - K 4.7  11. Hyponatremia - Na 131 improved with tolvaptan  CRITICAL CARE Performed by: Glori Bickers  Total critical care time: 35 minutes  Critical care time was exclusive of separately billable procedures and treating other patients.  Critical care was necessary to treat or prevent imminent or life-threatening deterioration.  Critical care was time spent personally by me (independent of midlevel providers or residents) on the following activities: development of treatment plan with patient and/or surrogate as well as nursing, discussions with consultants, evaluation of patient's response to treatment, examination of patient, obtaining history from patient or surrogate, ordering and performing treatments and interventions,  ordering and review of laboratory studies, ordering and review of radiographic studies, pulse oximetry and re-evaluation of patient's condition.   Glori Bickers MD 07/05/2019 11:29 AM

## 2019-07-06 LAB — COOXEMETRY PANEL
Carboxyhemoglobin: 1.4 % (ref 0.5–1.5)
Carboxyhemoglobin: 1.3 % (ref 0.5–1.5)
Methemoglobin: 1 % (ref 0.0–1.5)
Methemoglobin: 0.7 % (ref 0.0–1.5)
O2 Saturation: 47.9 %
O2 Saturation: 51.7 %
Total hemoglobin: 15.7 g/dL (ref 12.0–16.0)
Total hemoglobin: 10 g/dL — ABNORMAL LOW (ref 12.0–16.0)

## 2019-07-06 LAB — CBC
HCT: 30.9 % — ABNORMAL LOW (ref 39.0–52.0)
Hemoglobin: 9.6 g/dL — ABNORMAL LOW (ref 13.0–17.0)
MCH: 31.5 pg (ref 26.0–34.0)
MCHC: 31.1 g/dL (ref 30.0–36.0)
MCV: 101.3 fL — ABNORMAL HIGH (ref 80.0–100.0)
Platelets: 306 10*3/uL (ref 150–400)
RBC: 3.05 MIL/uL — ABNORMAL LOW (ref 4.22–5.81)
RDW: 14 % (ref 11.5–15.5)
WBC: 13.5 10*3/uL — ABNORMAL HIGH (ref 4.0–10.5)
nRBC: 0.2 % (ref 0.0–0.2)

## 2019-07-06 LAB — BASIC METABOLIC PANEL
Anion gap: 8 (ref 5–15)
BUN: 12 mg/dL (ref 6–20)
CO2: 25 mmol/L (ref 22–32)
Calcium: 8 mg/dL — ABNORMAL LOW (ref 8.9–10.3)
Chloride: 93 mmol/L — ABNORMAL LOW (ref 98–111)
Creatinine, Ser: 0.93 mg/dL (ref 0.61–1.24)
GFR calc Af Amer: >60 mL/min (ref >60)
GFR calc non Af Amer: >60 mL/min (ref >60)
Glucose, Bld: 216 mg/dL — ABNORMAL HIGH (ref 70–99)
Potassium: 4.3 mmol/L (ref 3.5–5.1)
Sodium: 126 mmol/L — ABNORMAL LOW (ref 135–145)

## 2019-07-06 LAB — GLUCOSE, CAPILLARY
Glucose-Capillary: 92 mg/dL (ref 70–99)
Glucose-Capillary: 136 mg/dL — ABNORMAL HIGH (ref 70–99)
Glucose-Capillary: 101 mg/dL — ABNORMAL HIGH (ref 70–99)
Glucose-Capillary: 111 mg/dL — ABNORMAL HIGH (ref 70–99)

## 2019-07-06 LAB — PROTIME-INR
INR: 2.3 — ABNORMAL HIGH (ref 0.8–1.2)
Prothrombin Time: 24.4 seconds — ABNORMAL HIGH (ref 11.4–15.2)

## 2019-07-06 LAB — MAGNESIUM: Magnesium: 1.9 mg/dL (ref 1.7–2.4)

## 2019-07-06 LAB — LACTATE DEHYDROGENASE: LDH: 375 U/L — ABNORMAL HIGH (ref 98–192)

## 2019-07-06 MED ORDER — MAGNESIUM SULFATE 2 GM/50ML IV SOLN
2.0000 g | Freq: Once | INTRAVENOUS | Status: AC
  Administered 2019-07-06: 2 g via INTRAVENOUS
  Filled 2019-07-06: qty 50

## 2019-07-06 MED ORDER — WARFARIN SODIUM 2 MG PO TABS
4.0000 mg | ORAL_TABLET | Freq: Every day | ORAL | Status: DC
  Administered 2019-07-06: 4 mg via ORAL
  Filled 2019-07-06: qty 2

## 2019-07-06 MED ORDER — MEXILETINE HCL 200 MG PO CAPS
200.0000 mg | ORAL_CAPSULE | Freq: Three times a day (TID) | ORAL | Status: DC
  Administered 2019-07-06 – 2019-07-07 (×3): 200 mg via ORAL
  Filled 2019-07-06 (×6): qty 1

## 2019-07-06 MED ORDER — MIDODRINE HCL 5 MG PO TABS
5.0000 mg | ORAL_TABLET | Freq: Three times a day (TID) | ORAL | Status: DC
  Administered 2019-07-06 – 2019-07-10 (×9): 5 mg via ORAL
  Filled 2019-07-06 (×10): qty 1

## 2019-07-06 MED ORDER — MIDODRINE HCL 5 MG PO TABS: 2.5000 mg | ORAL_TABLET | Freq: Three times a day (TID) | ORAL | Status: DC

## 2019-07-06 NOTE — Progress Notes (Signed)
CT surgery p.m. Rounds  Patient was lightheaded this a.m. but was able to get up in the chair later and took a walk as well. Ventricular arrhythmias have improved.  Lidocaine has been stopped and patient starting on mexiletine  -Blood pressure (!) 77/64, pulse 66, temperature 97.8 F (36.6 C), temperature source Oral, resp. rate (!) 25, height 5\' 9"  (1.753 m), weight 92.5 kg, SpO2 (!) 85 %.

## 2019-07-06 NOTE — Progress Notes (Signed)
[place] for Coumadin Indication: LVAD  Allergies  Allergen Reactions  . Codeine Hives    Patient Measurements: Height: 5\' 9"  (175.3 cm) Weight: 92.5 kg (203 lb 14.8 oz) IBW/kg (Calculated) : 70.7 Heparin Dosing Weight: ~ 92 kg  Vital Signs: Temp: 97.7 F (36.5 C) (07/05 1120) Temp Source: Oral (07/05 1120) BP: 83/71 (07/05 1130) Pulse Rate: 64 (07/05 1103)  Labs: Recent Labs    07/04/19 0231 07/04/19 0233 07/04/19 0401 07/04/19 0428 07/04/19 0428 07/05/19 0250 07/06/19 0337  HGB  --    < >  --  10.7*   < > 10.5* 9.6*  HCT  --    < >  --  33.7*  --  32.9* 30.9*  PLT  --    < >  --  331  --  340 306  LABPROT  --    < >  --  22.8*  --  21.6* 24.4*  INR  --    < >  --  2.1*  --  2.0* 2.3*  HEPARINUNFRC 0.23*  --   --   --   --   --   --   CREATININE  --   --  1.00  --   --  0.94 0.93   < > = values in this interval not displayed.    Estimated Creatinine Clearance: 100.8 mL/min (by C-G formula based on SCr of 0.93 mg/dL).   Medical History: Past Medical History:  Diagnosis Date  . Acute anterolateral wall MI ([place])   . CAD, multiple vessel   . Hypertension   . Ischemic cardiomyopathy   . Systolic heart failure ([place])   . Tobacco abuse     Medications:  Infusions:  . sodium chloride    . amiodarone 30 mg/hr (07/06/19 1100)  . lactated ringers    . lidocaine 1 mg/min (07/06/19 1100)  . magnesium sulfate bolus IVPB      Assessment: 54 yo male s/p LVAD HM3 placement 6/24.  Pharmacy asked to dose  anticoagulation with IV heparin and Coumadin.  No overt bleeding or complications noted.   INR today is therapeutic at 2.3. Hgb stable 9-10, plt stable 300 No s/sx of bleeding overnight. LDH down upper 300s  Goal of Therapy:  INR 2-2.5  Monitor platelets by anticoagulation protocol: Yes   Plan:  -Warfarin 4 mg PO daily  -Daily INR, CBC, LDH  Bonnita Nasuti Pharm.D. CPP, BCPS Clinical  Pharmacist [phone] 07/06/2019 12:26 PM    Please check AMION for all [place] phone numbers After 10:00 PM, call [place] [phone]

## 2019-07-06 NOTE — Progress Notes (Signed)
Physical Therapy Treatment Patient Details Name: Aaron [name] MRN: [id number] DOB: 1965/08/15 Today's Date: 07/06/2019    History of Present Illness Pt is 54yo admitted 6/16 with STEMI with multivessel disease. cath with no viable targets for CABG. Pt s/p HeartMate 3 LVAD implantation 6/24. VT 2x on 7/1 s/p DCCV. PMHx: HTN, tobacco use (30pack yrs)    PT Comments    Pt making steady progress with mobility and with managing LVAD.    Follow Up Recommendations  Home health PT;Supervision/Assistance - 24 hour     Equipment Recommendations  Other (comment) (rollator)    Recommendations for Other Services       Precautions / Restrictions Precautions Precautions: Fall;Sternal Precaution Comments: LVAD Restrictions Weight Bearing Restrictions: Yes Other Position/Activity Restrictions: STERNAL PRECAUTIONS    Mobility  Bed Mobility Overal bed mobility: Needs Assistance Bed Mobility: Supine to Sit   Sidelying to sit: Supervision       General bed mobility comments: Pt up with OT  Transfers Overall transfer level: Needs assistance Equipment used: 4-wheeled walker Transfers: Sit to/from Stand Sit to Stand: Min guard         General transfer comment: verbal cues for hands to knees.   Ambulation/Gait Ambulation/Gait assistance: Min guard Gait Distance (Feet): 120 Feet (60' x 1, 120' x 1) Assistive device: 4-wheeled walker Gait Pattern/deviations: Step-through pattern;Decreased stride length Gait velocity: decr Gait velocity interpretation: 1.31 - 2.62 ft/sec, indicative of limited community ambulator General Gait Details: assist for lines/safety. 1 sitting rest break. Reports mild dizziness throughout. BP and SpO2 monitor not reading during or after amb   Stairs             Wheelchair Mobility    Modified Rankin (Stroke Patients Only)       Balance Overall balance assessment: Needs assistance Sitting-balance support: No upper extremity supported;Feet  supported Sitting balance-Leahy Scale: Good     Standing balance support: During functional activity;No upper extremity supported Standing balance-Leahy Scale: Fair Standing balance comment: mild unsteadiness with mobility                             Cognition Arousal/Alertness: Awake/alert Behavior During Therapy: WFL for tasks assessed/performed Overall Cognitive Status: Impaired/Different from baseline Area of Impairment: Memory                     Memory: Decreased short-term memory         General Comments: Battery to power module change with supervision      Exercises      General Comments        Pertinent Vitals/Pain Pain Assessment: No/denies pain    Home Living                      Prior Function            PT Goals (current goals can now be found in the care plan section) Progress towards PT goals: Progressing toward goals    Frequency    Min 4X/week      PT Plan Current plan remains appropriate    Co-evaluation              AM-PAC PT "6 Clicks" Mobility   Outcome Measure  Help needed turning from your back to your side while in a flat bed without using bedrails?: A Little Help needed moving from lying on your back to sitting on the side of  a flat bed without using bedrails?: A Little Help needed moving to and from a bed to a chair (including a wheelchair)?: A Little Help needed standing up from a chair using your arms (e.g., wheelchair or bedside chair)?: A Little Help needed to walk in hospital room?: A Little Help needed climbing 3-5 steps with a railing? : A Little 6 Click Score: 18    End of Session Equipment Utilized During Treatment: Gait belt Activity Tolerance: Patient tolerated treatment well Patient left: in chair;with call bell/phone within reach Nurse Communication: Mobility status PT Visit Diagnosis: Other abnormalities of gait and mobility (R26.89)     Time: 9371-6967 PT Time  Calculation (min) (ACUTE ONLY): 18 min  Charges:  $Gait Training: 8-22 mins                     [place] Pager [phone] Office [place] 07/06/2019, 2:08 PM

## 2019-07-06 NOTE — Plan of Care (Signed)
°  Problem: Education: Goal: Understanding of cardiac disease, CV risk reduction, and recovery process will improve Outcome: Progressing Goal: Understanding of medication regimen will improve Outcome: Progressing Goal: Individualized Educational Video(s) Outcome: Progressing   Problem: Activity: Goal: Ability to tolerate increased activity will improve Outcome: Progressing   Problem: Cardiac: Goal: Ability to achieve and maintain adequate cardiopulmonary perfusion will improve Outcome: Progressing   Problem: Health Behavior/Discharge Planning: Goal: Ability to safely manage health-related needs after discharge will improve Outcome: Progressing   Problem: Education: Goal: Knowledge of General Education information will improve Description: Including pain rating scale, medication(s)/side effects and non-pharmacologic comfort measures Outcome: Progressing   Problem: Health Behavior/Discharge Planning: Goal: Ability to manage health-related needs will improve Outcome: Progressing   Problem: Clinical Measurements: Goal: Ability to maintain clinical measurements within normal limits will improve Outcome: Progressing Goal: Will remain free from infection Outcome: Progressing Goal: Diagnostic test results will improve Outcome: Progressing Goal: Respiratory complications will improve Outcome: Progressing Goal: Cardiovascular complication will be avoided Outcome: Progressing   Problem: Activity: Goal: Risk for activity intolerance will decrease Outcome: Progressing   Problem: Nutrition: Goal: Adequate nutrition will be maintained Outcome: Progressing   Problem: Coping: Goal: Level of anxiety will decrease Outcome: Progressing   Problem: Elimination: Goal: Will not experience complications related to bowel motility Outcome: Progressing Goal: Will not experience complications related to urinary retention Outcome: Progressing   Problem: Pain Managment: Goal: General  experience of comfort will improve Outcome: Progressing   Problem: Safety: Goal: Ability to remain free from injury will improve Outcome: Progressing   Problem: Skin Integrity: Goal: Risk for impaired skin integrity will decrease Outcome: Progressing

## 2019-07-06 NOTE — Progress Notes (Signed)
Occupational Therapy Treatment Patient Details Name: Aaron [name] MRN: [id number] DOB: 14-May-1965 Today's Date: 07/06/2019    History of present illness Pt is 54yo admitted 6/16 with STEMI with multivessel disease. cath with no viable targets for CABG. Pt s/p HeartMate 3 LVAD implantation 6/24. VT 2x on 7/1 s/p DCCV. PMHx: HTN, tobacco use (30pack yrs)   OT comments  This 54 yo male admitted and underwent above presents to acute OT making progress with LVAD lines, mobility, and ADLs. He will continue to benefit from acute OT with follow up [place].   Follow Up Recommendations  Supervision - Intermittent;Home health OT    Equipment Recommendations  3 in 1 bedside commode       Precautions / Restrictions Precautions Precautions: Fall;Sternal Precaution Comments: LVAD Restrictions Weight Bearing Restrictions: Yes Other Position/Activity Restrictions: STERNAL PRECAUTIONS       Mobility Bed Mobility Overal bed mobility: Needs Assistance Bed Mobility: Supine to Sit   Sidelying to sit: Supervision          Transfers Overall transfer level: Needs assistance Equipment used: Rolling walker (2 wheeled) Transfers: Sit to/from Stand Sit to Stand: Min guard         General transfer comment: cues for hand placement and safety    Balance Overall balance assessment: Needs assistance Sitting-balance support: No upper extremity supported;Feet supported Sitting balance-Leahy Scale: Good     Standing balance support: Bilateral upper extremity supported;During functional activity Standing balance-Leahy Scale: Fair Standing balance comment: mild unsteadiness with mobility                            ADL either performed or assessed with clinical judgement   ADL Overall ADL's : Needs assistance/impaired                 Upper Body Dressing : Supervision/safety;Set up Upper Body Dressing Details (indicate cue type and reason): pt able to cross legs to get to  feet to doff and donn socks with increased time     Toilet Transfer: Minimal assistance;Ambulation;RW Toilet Transfer Details (indicate cue type and reason): bed>53feet>sit on rollator seat                 Vision Baseline Vision/History: Wears glasses Wears Glasses: Reading only            Cognition Arousal/Alertness: Awake/alert Behavior During Therapy: WFL for tasks assessed/performed Overall Cognitive Status: Impaired/Different from baseline Area of Impairment: Memory                     Memory: Decreased short-term memory (had difficulty recalling all items needed in his carry bag)         General Comments: Pt able to change from [facility] to battery with only S                   Pertinent Vitals/ Pain       Pain Assessment: No/denies pain         Frequency  Min 2X/week        Progress Toward Goals  OT Goals(current goals can now be found in the care plan section)  Progress towards OT goals: Progressing toward goals     Plan Discharge plan remains appropriate       AM-PAC OT "6 Clicks" Daily Activity     Outcome Measure   Help from another person eating meals?: None Help from another person taking care of personal  grooming?: A Little Help from another person toileting, which includes using toliet, bedpan, or urinal?: A Little Help from another person bathing (including washing, rinsing, drying)?: A Little Help from another person to put on and taking off regular upper body clothing?: A Little Help from another person to put on and taking off regular lower body clothing?: A Little 6 Click Score: 19    End of Session Equipment Utilized During Treatment: Rolling walker  OT Visit Diagnosis: Unsteadiness on feet (R26.81);Muscle weakness (generalized) (M62.81)   Activity Tolerance Patient tolerated treatment well   Patient Left in chair;with call bell/phone within reach;with family/visitor present (sister)   Nurse Communication Mobility  status (a little dizzy but managed therapy)        Time: 9977-4142 OT Time Calculation (min): 22 min  Charges: OT General Charges $OT Visit: 1 Visit OT Treatments $Self Care/Home Management : 8-22 mins  [place], OTR/L Acute [employer] Pager [phone] Office [phone]      Almon Register 07/06/2019, 12:49 PM

## 2019-07-06 NOTE — Progress Notes (Signed)
Patient ID: Aaron [name], male   DOB: 21-Sep-1965, 54 y.o.   MRN: [id number]     Advanced Heart Failure Rounding Note  PCP-Cardiologist: No primary care provider on file.   Subjective:    - HM3 placement 6/24  - Had 2 episodes of VF requiring emergent DC-CV on 07/02/19 - Recurrent VF on 7/2 and broke spontaneously.  - 7/3 Recurrent VF at 2a.-> emergent DC-CV.   Remains off milrinone. On amio and lido at 1. On Ranexa.   No further VT. Feels dizzy when standing. MAP checked personally 72-74. CVP 13-14  Co-ox 48% then repeated at 52%  K 4.3 Mg 1.9  LDH 375 INR 2.3   LVAD Interrogation HM III: Speed: 5100 Flow: 4.0  PI: 3.4 Power: 3.0  VAD interrogated personally. Parameters stable. PI events much improved      Objective:   Weight Range: 92.5 kg Body mass index is 30.11 kg/m.   Vital Signs:   Temp:  [97.7 F (36.5 C)-98.6 F (37 C)] 97.7 F (36.5 C) (07/05 1120) Pulse Rate:  [26-75] 64 (07/05 1103) Resp:  [9-30] 25 (07/05 1130) BP: (60-147)/(28-86) 83/71 (07/05 1130) SpO2:  [79 %-96 %] 88 % (07/05 1103) Weight:  [92.5 kg] 92.5 kg (07/05 0500) Last BM Date: 07/04/19  Weight change: Filed Weights   07/04/19 0500 07/05/19 0500 07/06/19 0500  Weight: 94.5 kg 91.8 kg 92.5 kg    Intake/Output:   Intake/Output Summary (Last 24 hours) at 07/06/2019 1206 Last data filed at 07/06/2019 1100 Gross per 24 hour  Intake 719.89 ml  Output 575 ml  Net 144.89 ml      Physical Exam   MAPS low 70s  General:  NAD.  HEENT: normal  Neck: supple. JVP to jaw  Carotids 2+ bilat; no bruits. No lymphadenopathy or thryomegaly appreciated. Cor: LVAD hum.  Lungs: Clear. Abdomen: soft, nontender, non-distended. No hepatosplenomegaly. No bruits or masses. Good bowel sounds. Driveline site clean. Anchor in place.  Extremities: no cyanosis, clubbing, rash. Warm no edema  Neuro: alert & oriented x 3. No focal deficits. Moves all 4 without problem     Telemetry   Sinus 60s. Personally  reviewed   Labs    CBC Recent Labs    07/05/19 0250 07/06/19 0337  WBC 12.3* 13.5*  HGB 10.5* 9.6*  HCT 32.9* 30.9*  MCV 101.5* 101.3*  PLT 340 382   Basic Metabolic Panel Recent Labs    07/05/19 0250 07/06/19 0337  NA 131* 126*  K 4.7 4.3  CL 96* 93*  CO2 27 25  GLUCOSE 180* 216*  BUN 10 12  CREATININE 0.94 0.93  CALCIUM 8.3* 8.0*  MG 2.1 1.9   Liver Function Tests No results for input(s): AST, ALT, ALKPHOS, BILITOT, PROT, ALBUMIN in the last 72 hours. No results for input(s): LIPASE, AMYLASE in the last 72 hours. Cardiac Enzymes No results for input(s): CKTOTAL, CKMB, CKMBINDEX, TROPONINI in the last 72 hours.  BNP: BNP (last 3 results) Recent Labs    06/18/19 0119 06/26/19 0445 07/02/19 0531  BNP 1,068.5* 1,301.1* 1,646.4*    ProBNP (last 3 results) No results for input(s): PROBNP in the last 8760 hours.   D-Dimer No results for input(s): DDIMER in the last 72 hours. Hemoglobin A1C No results for input(s): HGBA1C in the last 72 hours. Fasting Lipid Panel No results for input(s): CHOL, HDL, LDLCALC, TRIG, CHOLHDL, LDLDIRECT in the last 72 hours. Thyroid Function Tests No results for input(s): TSH, T4TOTAL, T3FREE, THYROIDAB in  the last 72 hours.  Invalid input(s): FREET3  Other results:   Imaging    No results found.   Medications:     Scheduled Medications: . aspirin EC  81 mg Oral Daily  . atorvastatin  80 mg Oral Daily  . bisacodyl  10 mg Oral Daily   Or  . bisacodyl  10 mg Rectal Daily  . Chlorhexidine Gluconate Cloth  6 each Topical Daily  . clonazePAM  0.5 mg Oral BID  . colchicine  0.6 mg Oral BID  . digoxin  0.125 mg Oral Daily  . docusate sodium  200 mg Oral Daily  . doxycycline  100 mg Oral Q12H  . folic acid  1 mg Oral Daily  . gabapentin  300 mg Oral TID  . insulin aspart  0-15 Units Subcutaneous TID WC  . mouth rinse  15 mL Mouth Rinse BID  . midodrine  5 mg Oral TID WC  . multivitamin with minerals  1 tablet  Oral Daily  . pantoprazole  40 mg Oral Daily  . ranolazine  500 mg Oral BID  . sodium chloride flush  10-40 mL Intracatheter Q12H  . thiamine  100 mg Oral Daily  . Warfarin - Pharmacist Dosing Inpatient   Does not apply q1600    Infusions: . sodium chloride    . amiodarone 30 mg/hr (07/06/19 1100)  . lactated ringers    . lidocaine 1 mg/min (07/06/19 1100)    PRN Medications: acetaminophen, ALPRAZolam, bisacodyl, calcium carbonate, dextrose, fentaNYL (SUBLIMAZE) injection, ondansetron (ZOFRAN) IV, oxyCODONE, pneumococcal 23 valent vaccine, sodium chloride flush, sodium chloride flush, traMADol, traZODone    Patient Profile   Aaron [name] --post MI cardiogenic shock/ optimization prior to anticipated CABG, at the request of Dr. Marlou Porch, Cardiology.    Assessment/Plan   1. Sustained VT/VF  - developed VF on 7/1 x 2 requiring emergent DC-CV - On 7/2 had self-terminating episode - On 7/3 persistent VF/torsades -> DC-CV - suspect ischemically-mediated  - VT/VF quiescent on amio and lido and Ranexa today Watch QT - Will switch lido to mexilitene per EP - keep K> 4.0 Mg >2.0 .  - VAD speed reduced to 5000 - Keep off inotropes  2. Acute Anterolateral STEMI/ Multivessel CAD - Emergent cath showed severe multivessel disease w/ 75% mid- distal LM, 100% prox LAD, not amendable to PCI (unable to cross w/ guide wire, probable CTO), 100% prox LCx (probable CTO) and 100% ostial RCA (probable CTO). There were mild to moderate collaterals right to left to the LAD, minimal right to right collaterals an minimal left to left collaterals. LVEF 25% - Hs trop peaked 27,000. - No targets for CABG / PCI.   - S/P HMIII VAD - suspect VF is ischemically-mediated. Ranexa seems to be helping. Will continue  3.  Acute Systolic Heart Failure --->Cardiogenic Shock - 2/2 acute MI. LVEF by LVG 25% - IABP out 6/18 but replaced 6/23. Removed in OR on 6/24  - HMIII LVAD  Placed 6/24 - Extubated 6/25  -  Was on milrinone for RV failure. Now off due to VF.  - Co-ox low at 52%. CVP 12-13. No diuretics today - Continue digoxin.  - INR 2.3. Off heparin  - LDH 347>>459>463>517>554->460 -> 375 - MAPs low. Stop spiro an sildenafil. Start midodrine for low BP.  - Continue gabapentin 300 mg tid for pocket pain + back-up fentanyl - Ramp ECHO soon.  - VAD interrogated personally. Parameters stable.  4. Tobacco Abuse - 30  pack years  - smoking cessation advised  - Will need to quit if to be considered for heart transplant in the future   5. Renal mass = RCC - MRI abdomen- 1.8 cm right renal lesion, most consistent with renal cell carcinoma. No evidence of renal vein involvement or nodal metastasis. - Discussed w/ Urology and R RCC is small and will just require surveillance.  Ok to proceed with VAD from their standpoint.   8. Hyponatremia  -Sodium 131 -> 126. Restrict free water. Consider tolvaptan  9. Sleep Apnea - frequent apnea at night  - needs formal sleep study   - CPAP ordered while inpatient  10. Hypokalemia - K 4.3  CRITICAL CARE Performed by: Glori Bickers  Total critical care time: 35 minutes  Critical care time was exclusive of separately billable procedures and treating other patients.  Critical care was necessary to treat or prevent imminent or life-threatening deterioration.  Critical care was time spent personally by me (independent of midlevel providers or residents) on the following activities: development of treatment plan with patient and/or surrogate as well as nursing, discussions with consultants, evaluation of patient's response to treatment, examination of patient, obtaining history from patient or surrogate, ordering and performing treatments and interventions, ordering and review of laboratory studies, ordering and review of radiographic studies, pulse oximetry and re-evaluation of patient's condition.   Glori Bickers MD 07/06/2019 12:06 PM

## 2019-07-06 NOTE — Progress Notes (Signed)
LVAD driveline dressing changed by Robinette Haines RN using sterile technique. Dressing, silver Aquacell strip, and driveline anchor all changed. Patient's sister [name] at bedside and observed dressing change. Next dressing change due tomorrow 07/07/2019.

## 2019-07-07 DIAGNOSIS — R531 Weakness: Secondary | ICD-10-CM

## 2019-07-07 LAB — BASIC METABOLIC PANEL
Anion gap: 11 (ref 5–15)
BUN: 12 mg/dL (ref 6–20)
CO2: 23 mmol/L (ref 22–32)
Calcium: 8.4 mg/dL — ABNORMAL LOW (ref 8.9–10.3)
Chloride: 92 mmol/L — ABNORMAL LOW (ref 98–111)
Creatinine, Ser: 1.01 mg/dL (ref 0.61–1.24)
GFR calc Af Amer: >60 mL/min (ref >60)
GFR calc non Af Amer: >60 mL/min (ref >60)
Glucose, Bld: 157 mg/dL — ABNORMAL HIGH (ref 70–99)
Potassium: 4.4 mmol/L (ref 3.5–5.1)
Sodium: 126 mmol/L — ABNORMAL LOW (ref 135–145)

## 2019-07-07 LAB — PROTIME-INR
INR: 2.7 — ABNORMAL HIGH (ref 0.8–1.2)
Prothrombin Time: 28.1 seconds — ABNORMAL HIGH (ref 11.4–15.2)

## 2019-07-07 LAB — CBC
HCT: 33 % — ABNORMAL LOW (ref 39.0–52.0)
Hemoglobin: 10.5 g/dL — ABNORMAL LOW (ref 13.0–17.0)
MCH: 31.7 pg (ref 26.0–34.0)
MCHC: 31.8 g/dL (ref 30.0–36.0)
MCV: 99.7 fL (ref 80.0–100.0)
Platelets: 345 10*3/uL (ref 150–400)
RBC: 3.31 MIL/uL — ABNORMAL LOW (ref 4.22–5.81)
RDW: 13.9 % (ref 11.5–15.5)
WBC: 15.9 10*3/uL — ABNORMAL HIGH (ref 4.0–10.5)
nRBC: 0 % (ref 0.0–0.2)

## 2019-07-07 LAB — COOXEMETRY PANEL
Carboxyhemoglobin: 1.6 % — ABNORMAL HIGH (ref 0.5–1.5)
Methemoglobin: 1.1 % (ref 0.0–1.5)
O2 Saturation: 51.5 %
Total hemoglobin: 10.7 g/dL — ABNORMAL LOW (ref 12.0–16.0)

## 2019-07-07 LAB — GLUCOSE, CAPILLARY
Glucose-Capillary: 80 mg/dL (ref 70–99)
Glucose-Capillary: 119 mg/dL — ABNORMAL HIGH (ref 70–99)
Glucose-Capillary: 180 mg/dL — ABNORMAL HIGH (ref 70–99)
Glucose-Capillary: 135 mg/dL — ABNORMAL HIGH (ref 70–99)

## 2019-07-07 LAB — MAGNESIUM: Magnesium: 2.1 mg/dL (ref 1.7–2.4)

## 2019-07-07 LAB — LACTATE DEHYDROGENASE: LDH: 383 U/L — ABNORMAL HIGH (ref 98–192)

## 2019-07-07 MED ORDER — ENSURE ENLIVE PO LIQD
237.0000 mL | Freq: Three times a day (TID) | ORAL | Status: DC
  Administered 2019-07-08: 237 mL via ORAL

## 2019-07-07 MED ORDER — PHENYLEPHRINE 40 MCG/ML (10ML) SYRINGE FOR IV PUSH (FOR BLOOD PRESSURE SUPPORT)
PREFILLED_SYRINGE | INTRAVENOUS | Status: AC
  Filled 2019-07-07: qty 10

## 2019-07-07 MED ORDER — WARFARIN SODIUM 3 MG PO TABS
3.0000 mg | ORAL_TABLET | Freq: Once | ORAL | Status: AC
  Administered 2019-07-07: 3 mg via ORAL
  Filled 2019-07-07: qty 1

## 2019-07-07 MED ORDER — FENTANYL CITRATE (PF) 100 MCG/2ML IJ SOLN
100.0000 ug | Freq: Once | INTRAMUSCULAR | Status: AC
  Administered 2019-07-07: 100 ug via INTRAVENOUS
  Filled 2019-07-07: qty 2

## 2019-07-07 MED ORDER — LIDOCAINE IN D5W 4-5 MG/ML-% IV SOLN
1.0000 mg/min | INTRAVENOUS | Status: DC
  Administered 2019-07-07: 1 mg/min via INTRAVENOUS
  Filled 2019-07-07: qty 500

## 2019-07-07 MED ORDER — MIDAZOLAM HCL 2 MG/2ML IJ SOLN
6.0000 mg | Freq: Once | INTRAMUSCULAR | Status: AC
  Administered 2019-07-07: 6 mg via INTRAVENOUS
  Filled 2019-07-07: qty 6

## 2019-07-07 NOTE — CV Procedure (Signed)
    DIRECT CURRENT CARDIOVERSION  NAME:  Aaron [name]   MRN: [id number] DOB:  [birth year]   ADMIT DATE: 06/17/2019   INDICATIONS: Ventricular fibrillation    PROCEDURE:   Developed recurrent VF/Torsades around 7a with drop in flows on LVAD.  Patient became progressively dizzy and apprehensive.   RT in room.  I gave him a total of versed 6mg  and fentanyl 128mcg with blow-by bag mask support. When patient was moderately sedately. I performed emergent cardioversion at 200J with prompt conversion to NSR and improved hemodynamics and VAD flows.    Glori Bickers, MD  11:04 AM

## 2019-07-07 NOTE — Progress Notes (Signed)
Physical Therapy Treatment Patient Details Name: Aaron [name] MRN: [id number] DOB: 17-Dec-1965 Today's Date: 07/07/2019    History of Present Illness Pt is 54yo admitted 6/16 with STEMI with multivessel disease. cath with no viable targets for CABG. Pt s/p HeartMate 3 LVAD implantation 6/24. VT 2x on 7/1 s/p DCCV. PMHx: HTN, tobacco use (30pack yrs)    PT Comments    Pt able to amb in hallway this afternoon. Vital signs stable. Expect pt to continue to progress with mobility as medical status continues to improve.    Follow Up Recommendations  Home health PT;Supervision/Assistance - 24 hour     Equipment Recommendations  Other (comment) (rollator)    Recommendations for Other Services       Precautions / Restrictions Precautions Precautions: Fall;Sternal Precaution Comments: LVAD    Mobility  Bed Mobility Overal bed mobility: Needs Assistance Bed Mobility: Supine to Sit     Supine to sit: Supervision     General bed mobility comments: Assist for lines. Pt able to rise following sternal precautions  Transfers Overall transfer level: Needs assistance Equipment used: 4-wheeled walker Transfers: Sit to/from Stand Sit to Stand: Min guard         General transfer comment: verbal cues for hands to knees.   Ambulation/Gait Ambulation/Gait assistance: Min guard Gait Distance (Feet): 150 Feet Assistive device: 4-wheeled walker Gait Pattern/deviations: Step-through pattern;Decreased stride length Gait velocity: decr Gait velocity interpretation: 1.31 - 2.62 ft/sec, indicative of limited community ambulator General Gait Details: assist for lines/safety. 1 standing rest break. Reports mild lightheadedness throughout. HR 60's throughout   Stairs             Wheelchair Mobility    Modified Rankin (Stroke Patients Only)       Balance Overall balance assessment: Needs assistance Sitting-balance support: No upper extremity supported;Feet supported Sitting  balance-Leahy Scale: Good     Standing balance support: During functional activity;No upper extremity supported Standing balance-Leahy Scale: Fair                              Cognition Arousal/Alertness: Awake/alert Behavior During Therapy: WFL for tasks assessed/performed Overall Cognitive Status: Within Functional Limits for tasks assessed                                        Exercises      General Comments        Pertinent Vitals/Pain Pain Assessment: No/denies pain    Home Living                      Prior Function            PT Goals (current goals can now be found in the care plan section) Progress towards PT goals: Progressing toward goals    Frequency    Min 4X/week      PT Plan Current plan remains appropriate    Co-evaluation              AM-PAC PT "6 Clicks" Mobility   Outcome Measure  Help needed turning from your back to your side while in a flat bed without using bedrails?: A Little Help needed moving from lying on your back to sitting on the side of a flat bed without using bedrails?: A Little Help needed moving to and from a bed  to a chair (including a wheelchair)?: A Little Help needed standing up from a chair using your arms (e.g., wheelchair or bedside chair)?: A Little Help needed to walk in hospital room?: A Little Help needed climbing 3-5 steps with a railing? : A Little 6 Click Score: 18    End of Session   Activity Tolerance: Patient tolerated treatment well Patient left: in chair;with call bell/phone within reach Nurse Communication: Mobility status PT Visit Diagnosis: Other abnormalities of gait and mobility (R26.89)     Time: 1355-1415 PT Time Calculation (min) (ACUTE ONLY): 20 min  Charges:  $Gait Training: 8-22 mins                     [place] Pager [phone] Office [place] 07/07/2019, 3:23 PM

## 2019-07-07 NOTE — Discharge Summary (Signed)
Patient presented 06/17/2019 with STEMI Patient transferred to [facility] 06/17/2019 because cardiogenic shock  Procedures Diagnostic left heart cath Intra-aortic balloon pump placement Right heart cath with Swan placement Intervention was deferred  Patient initially presented to the emergency room with a STEMI he was taken to the cardiac Cath Lab but found to have no significant arteries amenable.  Intervention patient had multiple CTO's severe cardiomyopathy ischemic patient had cardiogenic shock and treated with pump was placed for cardiac support Swan-Ganz was also placed patient was then transported to [facility] for further management  Medications on transfer Aspirin 81 mg a day Heparin IV intravenously IABP in place Swan-Ganz in place  Patient transferred to ICU level care at [facility] Patient accepted by Dr. Dyanne Carrel

## 2019-07-07 NOTE — Plan of Care (Signed)
  Problem: Education: Goal: Understanding of cardiac disease, CV risk reduction, and recovery process will improve Outcome: Progressing   Problem: Activity: Goal: Ability to tolerate increased activity will improve Outcome: Progressing   Problem: Cardiac: Goal: Ability to achieve and maintain adequate cardiopulmonary perfusion will improve Outcome: Progressing   Problem: Clinical Measurements: Goal: Respiratory complications will improve Outcome: Progressing   Problem: Activity: Goal: Risk for activity intolerance will decrease Outcome: Progressing   Problem: Coping: Goal: Level of anxiety will decrease Outcome: Progressing   Problem: Pain Managment: Goal: General experience of comfort will improve Outcome: Progressing   Problem: Clinical Measurements: Goal: Cardiovascular complication will be avoided Outcome: Not Progressing Note: Pt continuing to have refractory VT/VF requiring cardioversion this AM

## 2019-07-07 NOTE — Progress Notes (Signed)
LVAD Coordinator Rounding Note:  Transferred from [facility] ED on 06/17/19 with STEMI. Cath revealed multi-vessel CAD, wire would not cross to open LAD; IABP placed and pt transferred here for evaluation for CABG.   Dr. Orvan Seen evaluated and found he was not amenable to surgical or percutaneous revascularization due to poor targets. With severe depressed LV function and little coronary perfusion, LVAD was recommended.   HM III LVAD implanted on 06/25/19 by Dr. Orvan Seen under Destination Therapy criteria due to recent smoking history.  Pt had another episode of VF this morning requiring defibrillation.   Pt lying in bed on my arrival, states that he is "sore."  Vital signs: Temp: 96.5 HR: 63 Doppler Pressure: 86 BP: 81/66 (73) O2 Sat: 99% on 4 L/[place] Wt: 207>225.7>222.6>214.3>213.4>203.9>205.3>209 lbs  LVAD parameters: Speed:  5000 Flow: 3.7 Power:  3.5w PI: 4.7 Hct: 33  Alarms: none Events: 6 PI events today  Fixed speed: 5000 Low speed limit: 4700   Drive Line:  Existing VAD dressing removed and site care performed using sterile technique by pts sister-Joanne. Drive line site cleaned with Chlora prep applicator x2, allowed to dry, and gauze dressing with silver strip re-applied.  Exit site with suture remaining, the velour is fully implanted at the exit site. No tissue ingrowth noted. Small amount of redness noted at exit site. Minimal fatty necrosis drainage noted on dressing, no tenderness, foul odor or rash noted.  Drive line anchor intact.          Counter Incision: Clean, dry, intact. Mild redness noted at site. Dermabond intact.    Labs:  LDH trend: 557>322>025>427>062>376>283>151  INR trend: 1.4>1.3>1.1>1.2>1.4>1.5>1.7>2.7  Anticoagulation Plan: -INR Goal:  2.0 - 2.5 -ASA Dose: 325 mg daily until INR therapeutic  Blood Products:  -Intra-op - 06/25/19>>6 units FFP   Device: N/A  Drips: Milrinone 0.125 mcg/kg/min>> off Lasix 8 mg/hr-off  today Amiodarone 30mg /hr  Lidocaine 1 mg/min   Arrythmias: was on Amiodarone pre implant for NSVT  Respiratory: extubated 06/26/19  Nitric Oxide: off 06/26/19  VAD Education: 1. Sister-Joanne did a good job with dressing change today. We will need to observe her one more time before we can check her off to perform independently. 2. Patient tired and unable to stay awake for teaching today.    Plan/Recommendations:  1. Call VAD pager if any VAD equipment or drive line issues. 2. Daily dressing changes per VAD Coordinator, Nurse Davonna Belling, or trained caregiver.  Tanda Rockers RN [place] Coordinator  Office: [phone]  24/7 Pager: [phone]

## 2019-07-07 NOTE — Progress Notes (Signed)
Nutrition Follow-up  DOCUMENTATION CODES:   Not applicable  INTERVENTION:   Ensure Enlive po TID, each supplement provides 350 kcal and 20 grams of protein  Consider liberalizing diet if po intake remains poor   NUTRITION DIAGNOSIS:   Inadequate oral intake related to poor appetite as evidenced by meal completion < 50%.  Being addressed via supplements  GOAL:   Patient will meet greater than or equal to 90% of their needs  Progressing  MONITOR:   PO intake, Supplement acceptance, Weight trends, Labs, I & O's  REASON FOR ASSESSMENT:   Consult LVAD Eval  ASSESSMENT:   Patient with PMH significant for HTN. Presents this admission with STEMI and acute systolic heart failure.   6/24 HM3 LVAD placed  Pt with multiple episode of VF/VT. Pt with VT/VF requiring shock this AM  Pt reports appetite is poor, did not eat breakfast this AM due to above events. Recorded po intake 0-100% of meals. Recorded po intake 40% on average  Pt reports he does not like the taste of the hospital food. Pt does like the Ensure and will drink. Given poor po intake, RD to reorder.   Noted hyponatremia worse than previous assessment; noted MD plan to restrict free water  Labs: sodium 126 (L), Creatinine wdl, CBGs 80-136 Meds: coumadin, Tums prn, protonix, MVI with Minerals    Diet Order:   Diet Order            Diet Heart Room service appropriate? Yes; Fluid consistency: Thin  Diet effective now                 EDUCATION NEEDS:   Not appropriate for education at this time  Skin:  Skin Assessment: Reviewed RN Assessment  Last BM:  7/4  Height:   Ht Readings from Last 1 Encounters:  06/18/19 5\' 9"  (1.753 m)    Weight:   Wt Readings from Last 1 Encounters:  07/07/19 94.8 kg    BMI:  Body mass index is 30.86 kg/m.  Estimated Nutritional Needs:   Kcal:  2300-2500 kcal  Protein:  115-130 grams  Fluid:  >/= 2.3 L/day  Kerman Passey MS, RDN, LDN, CNSC Registered  Dietitian III Clinical Nutrition RD Pager and On-Call Pager Number Located in [place]

## 2019-07-07 NOTE — Progress Notes (Signed)
[place] for Coumadin Indication: LVAD  Allergies  Allergen Reactions  . Codeine Hives    Patient Measurements: Height: 5\' 9"  (175.3 cm) Weight: 94.8 kg (208 lb 15.9 oz) IBW/kg (Calculated) : 70.7 Heparin Dosing Weight: ~ 92 kg  Vital Signs: Temp: 97.6 F (36.4 C) (07/05 2318) Temp Source: Oral (07/05 2318) BP: 81/66 (07/06 0600) Pulse Rate: 34 (07/06 0500)  Labs: Recent Labs    07/05/19 0250 07/05/19 0250 07/06/19 0337 07/07/19 0433  HGB 10.5*   < > 9.6* 10.5*  HCT 32.9*  --  30.9* 33.0*  PLT 340  --  306 345  LABPROT 21.6*  --  24.4* 28.1*  INR 2.0*  --  2.3* 2.7*  CREATININE 0.94  --  0.93 1.01   < > = values in this interval not displayed.    Estimated Creatinine Clearance: 93.9 mL/min (by C-G formula based on SCr of 1.01 mg/dL).   Medical History: Past Medical History:  Diagnosis Date  . Acute anterolateral wall MI ([place])   . CAD, multiple vessel   . Hypertension   . Ischemic cardiomyopathy   . Systolic heart failure ([place])   . Tobacco abuse     Medications:  Infusions:  . sodium chloride    . amiodarone 30 mg/hr (07/07/19 0715)  . lactated ringers    . lidocaine 1 mg/min (07/07/19 1610)    Assessment: 54 yo male s/p LVAD HM3 placement 6/24.  Pharmacy asked to dose  anticoagulation with IV heparin and Coumadin.  No overt bleeding or complications noted.    INR today is slightly supratherapeutic at 2.7. Hgb stable 10, plt stable 383. No s/sx of bleeding overnight. LDH down upper 300s. Underwent cardioversion today for recurrent VT.  Goal of Therapy:  INR 2-2.5  Monitor platelets by anticoagulation protocol: Yes   Plan:  -Warfarin 3 mg PO daily  -Daily INR, CBC, LDH  Antonietta Jewel, PharmD, [place] Clinical Pharmacist  Phone: [phone] 07/07/2019 8:11 AM  Please check AMION for all [place] phone numbers After 10:00 PM, call [place] [phone]

## 2019-07-07 NOTE — Progress Notes (Addendum)
Patient ID: Aaron [name], male   DOB: Apr 29, 1965, 54 y.o.   MRN: [id number]     Advanced Heart Failure Rounding Note  PCP-Cardiologist: No primary care provider on file.   Subjective:    - HM3 placement 6/24  - Had 2 episodes of VF requiring emergent DC-CV on 07/02/19 - Recurrent VF on 7/2 and broke spontaneously.  - 7/3 Recurrent VF at 2a.-> emergent DC-CV.  - 07/07/2019 VT/Torsades   CO-OX 52%.   Yesterday sildenafil and spiro stopped. Started on midodrine.   Walked around the unit this morning. Had BM this morning. Went back in VF getting back in bed.    LVAD Interrogation HM III: Speed: 5000 Flow: 3.1   PI: 1.9 Power: 3.2   < 10 PI events.      Objective:   Weight Range: 94.8 kg Body mass index is 30.86 kg/m.   Vital Signs:   Temp:  [97.6 F (36.4 C)-98 F (36.7 C)] 97.6 F (36.4 C) (07/05 2318) Pulse Rate:  [34-121] 34 (07/06 0500) Resp:  [12-29] 24 (07/06 0600) BP: (58-147)/(27-105) 81/66 (07/06 0600) SpO2:  [67 %-99 %] 97 % (07/06 0500) Weight:  [94.8 kg] 94.8 kg (07/06 0500) Last BM Date: 07/06/19  Weight change: Filed Weights   07/05/19 0500 07/06/19 0500 07/07/19 0500  Weight: 91.8 kg 92.5 kg 94.8 kg    Intake/Output:   Intake/Output Summary (Last 24 hours) at 07/07/2019 0740 Last data filed at 07/07/2019 0600 Gross per 24 hour  Intake 1293.61 ml  Output 600 ml  Net 693.61 ml      Physical Exam   Maps 70s  CVP 12  Physical Exam: GENERAL: No acute distress. HEENT: normal  NECK: Supple, JVP difficult to asssess.  .  Carotids 2+ bilaterally, no bruits.  No lymphadenopathy or thyromegaly appreciated.   CARDIAC:  Mechanical heart sounds with LVAD hum present.  LUNGS:  Clear to auscultation bilaterally.  ABDOMEN:  Soft, round, nontender, positive bowel sounds x4.     LVAD exit site: well-healed and incorporated.  Dressing dry and intact.  No erythema or drainage.  Stabilization device present and accurately applied.  Driveline dressing is being  changed daily per sterile technique. EXTREMITIES:  Warm and dry, no cyanosis, clubbing, rash or edema . RUE PICC  NEUROLOGIC:  Alert and oriented x 3.    No aphasia.  No dysarthria.  Affect flat     Telemetry   VF /Torsades    Labs    CBC Recent Labs    07/06/19 0337 07/07/19 0433  WBC 13.5* 15.9*  HGB 9.6* 10.5*  HCT 30.9* 33.0*  MCV 101.3* 99.7  PLT 306 782   Basic Metabolic Panel Recent Labs    07/06/19 0337 07/07/19 0433  NA 126* 126*  K 4.3 4.4  CL 93* 92*  CO2 25 23  GLUCOSE 216* 157*  BUN 12 12  CREATININE 0.93 1.01  CALCIUM 8.0* 8.4*  MG 1.9 2.1   Liver Function Tests No results for input(s): AST, ALT, ALKPHOS, BILITOT, PROT, ALBUMIN in the last 72 hours. No results for input(s): LIPASE, AMYLASE in the last 72 hours. Cardiac Enzymes No results for input(s): CKTOTAL, CKMB, CKMBINDEX, TROPONINI in the last 72 hours.  BNP: BNP (last 3 results) Recent Labs    06/18/19 0119 06/26/19 0445 07/02/19 0531  BNP 1,068.5* 1,301.1* 1,646.4*    ProBNP (last 3 results) No results for input(s): PROBNP in the last 8760 hours.   D-Dimer No results for input(s): DDIMER  in the last 72 hours. Hemoglobin A1C No results for input(s): HGBA1C in the last 72 hours. Fasting Lipid Panel No results for input(s): CHOL, HDL, LDLCALC, TRIG, CHOLHDL, LDLDIRECT in the last 72 hours. Thyroid Function Tests No results for input(s): TSH, T4TOTAL, T3FREE, THYROIDAB in the last 72 hours.  Invalid input(s): FREET3  Other results:   Imaging    No results found.   Medications:     Scheduled Medications: . aspirin EC  81 mg Oral Daily  . atorvastatin  80 mg Oral Daily  . bisacodyl  10 mg Oral Daily   Or  . bisacodyl  10 mg Rectal Daily  . Chlorhexidine Gluconate Cloth  6 each Topical Daily  . clonazePAM  0.5 mg Oral BID  . colchicine  0.6 mg Oral BID  . digoxin  0.125 mg Oral Daily  . docusate sodium  200 mg Oral Daily  . doxycycline  100 mg Oral Q12H  .  fentaNYL (SUBLIMAZE) injection  100 mcg Intravenous Once  . folic acid  1 mg Oral Daily  . gabapentin  300 mg Oral TID  . insulin aspart  0-15 Units Subcutaneous TID WC  . mouth rinse  15 mL Mouth Rinse BID  . midazolam  6 mg Intravenous Once  . midodrine  5 mg Oral TID WC  . multivitamin with minerals  1 tablet Oral Daily  . pantoprazole  40 mg Oral Daily  . ranolazine  500 mg Oral BID  . sodium chloride flush  10-40 mL Intracatheter Q12H  . thiamine  100 mg Oral Daily  . warfarin  4 mg Oral q1600  . Warfarin - Pharmacist Dosing Inpatient   Does not apply q1600    Infusions: . sodium chloride    . amiodarone 30 mg/hr (07/07/19 0715)  . lactated ringers    . lidocaine 1 mg/min (07/07/19 0722)    PRN Medications: acetaminophen, ALPRAZolam, bisacodyl, calcium carbonate, dextrose, fentaNYL (SUBLIMAZE) injection, ondansetron (ZOFRAN) IV, oxyCODONE, pneumococcal 23 valent vaccine, sodium chloride flush, sodium chloride flush, traMADol, traZODone    Patient Profile   Aaron [name] --post MI cardiogenic shock/ optimization prior to anticipated CABG, at the request of Dr. Marlou Porch, Cardiology.    Assessment/Plan   1. Sustained VT/VF  - developed VF on 7/1 x 2 requiring emergent DC-CV - On 7/2 had self-terminating episode - On 7/3 persistent VF/torsades -> DC-CV -On 7/6  VF/torsades.  - Restart lidocaine, bolus amio 150 mg now, increase amio to 60 mg per hour.   - Plan to continue lidocaine 36 hours. - Will need urgent cardioversion.  - Discussed with EP at bedside.  - keep K> 4.0 Mg >2.0 .  - VAD speed reduced to 5000 - Keep off inotropes  2. Acute Anterolateral STEMI/ Multivessel CAD - Emergent cath showed severe multivessel disease w/ 75% mid- distal LM, 100% prox LAD, not amendable to PCI (unable to cross w/ guide wire, probable CTO), 100% prox LCx (probable CTO) and 100% ostial RCA (probable CTO). There were mild to moderate collaterals right to left to the LAD, minimal  right to right collaterals an minimal left to left collaterals. LVEF 25% - Hs trop peaked 27,000. - No targets for CABG / PCI.   - S/P HMIII VAD - suspect VF is ischemically-mediated. Ranexa seems to be helping. Will continue  3.  Acute Systolic Heart Failure --->Cardiogenic Shock - 2/2 acute MI. LVEF by LVG 25% - IABP out 6/18 but replaced 6/23. Removed in OR on 6/24  -  HMIII LVAD  Placed 6/24 - Extubated 6/25  - Was on milrinone for RV failure. Now off due to VF.  - Co-ox low at 52%. CVP 12.  No diuretics today - Continue digoxin.  - INR 2.7 Off heparin  - LDH 347>>459>463>517>554->460 -> 375-->383  - MAPs low. Stop spiro an sildenafil. Start midodrine for low BP.  - Continue gabapentin 300 mg tid for pocket pain + back-up fentanyl - Ramp ECHO soon.  - VAD interrogated personally. Parameters stable.  4. Tobacco Abuse - 30 pack years  - smoking cessation advised  - Will need to quit if to be considered for heart transplant in the future   5. Renal mass = RCC - MRI abdomen- 1.8 cm right renal lesion, most consistent with renal cell carcinoma. No evidence of renal vein involvement or nodal metastasis. - Discussed w/ Urology and R RCC is small and will just require surveillance.  Ok to proceed with VAD from their standpoint.   8. Hyponatremia  -Sodium 131 -> 126->-->126 . Restrict free water.   9. Sleep Apnea - frequent apnea at night  - needs formal sleep study   - CPAP ordered while inpatient  10. Hypokalemia - K 4.4   Discussed with pharmacy and EP at the bedside.  Cardioversion performed by Dr Vaughan Browner 200 J x1 NSR.    Amy Clegg NP-C  07/07/2019 7:40 AM   Agree with above.   Lidocaine stopped yesterday. On amio, mexilitene and Ranexa. He developed recurrent VF today requiring emergent DC-CV. Co-ox remains low 52%. CVP 12.   General:  Lying inbed apprehensive and weak   HEENT: normal  Neck: supple. JVP not elevated.  Carotids 2+ bilat; no bruits. No  lymphadenopathy or thryomegaly appreciated. Cor: LVAD hum.  Lungs: Clear. Abdomen: obese soft, nontender, non-distended. No hepatosplenomegaly. No bruits or masses. Good bowel sounds. Driveline site clean. Anchor in place.  Extremities: no cyanosis, clubbing, rash. Warm no tr edema  Neuro: alert & oriented x 3. No focal deficits. Moves all 4 without problem   Had recurrent VT. Co-ox remains low. Now s/p repeat DC-CV. D/w EP. Restart lidocaine. Continue IV amio. Watch electrolytes.  CRITICAL CARE Performed by: Glori Bickers  Total critical care time: 45 minutes  Critical care time was exclusive of separately billable procedures and treating other patients.  Critical care was necessary to treat or prevent imminent or life-threatening deterioration.  Critical care was time spent personally by me (independent of midlevel providers or residents) on the following activities: development of treatment plan with patient and/or surrogate as well as nursing, discussions with consultants, evaluation of patient's response to treatment, examination of patient, obtaining history from patient or surrogate, ordering and performing treatments and interventions, ordering and review of laboratory studies, ordering and review of radiographic studies, pulse oximetry and re-evaluation of patient's condition.  Glori Bickers, MD  11:10 AM

## 2019-07-07 NOTE — Progress Notes (Addendum)
Electrophysiology Rounding Note  Patient Name: Aaron [name] Date of Encounter: 07/07/2019   Subjective   Went into VT/VF just after 0700 this am after walking the unit and having a BM. HF team has seen. Ordered re-bolus amio and resume lidocaine gtt. Electrolytes stable.   Pt is symptomatic with his VT currently, with chest pain   Inpatient Medications    Scheduled Meds:  aspirin EC  81 mg Oral Daily   atorvastatin  80 mg Oral Daily   bisacodyl  10 mg Oral Daily   Or   bisacodyl  10 mg Rectal Daily   Chlorhexidine Gluconate Cloth  6 each Topical Daily   clonazePAM  0.5 mg Oral BID   colchicine  0.6 mg Oral BID   digoxin  0.125 mg Oral Daily   docusate sodium  200 mg Oral Daily   doxycycline  100 mg Oral Y65K   folic acid  1 mg Oral Daily   gabapentin  300 mg Oral TID   insulin aspart  0-15 Units Subcutaneous TID WC   mouth rinse  15 mL Mouth Rinse BID   midodrine  5 mg Oral TID WC   multivitamin with minerals  1 tablet Oral Daily   pantoprazole  40 mg Oral Daily   phenylephrine       ranolazine  500 mg Oral BID   sodium chloride flush  10-40 mL Intracatheter Q12H   thiamine  100 mg Oral Daily   warfarin  3 mg Oral ONCE-1600   Warfarin - Pharmacist Dosing Inpatient   Does not apply q1600   Continuous Infusions:  sodium chloride     amiodarone 30 mg/hr (07/07/19 0715)   lactated ringers     lidocaine 1 mg/min (07/07/19 0722)   PRN Meds: acetaminophen, ALPRAZolam, bisacodyl, calcium carbonate, dextrose, fentaNYL (SUBLIMAZE) injection, ondansetron (ZOFRAN) IV, oxyCODONE, pneumococcal 23 valent vaccine, sodium chloride flush, sodium chloride flush, traMADol, traZODone   Vital Signs    Vitals:   07/07/19 0400 07/07/19 0500 07/07/19 0600 07/07/19 0700  BP: 109/84  (!) 81/66   Pulse: 84 (!) 34    Resp: (!) 25 18 (!) 24   Temp:    (!) 96.5 F (35.8 C)  TempSrc:    Oral  SpO2: 92% 97%    Weight:  94.8 kg    Height:         Intake/Output Summary (Last 24 hours) at 07/07/2019 0836 Last data filed at 07/07/2019 0600 Gross per 24 hour  Intake 1293.61 ml  Output 600 ml  Net 693.61 ml   Filed Weights   07/05/19 0500 07/06/19 0500 07/07/19 0500  Weight: 91.8 kg 92.5 kg 94.8 kg    Physical Exam    GEN- The patient is well appearing, alert and oriented x 3 today.   Head- normocephalic, atraumatic Eyes-  Sclera clear, conjunctiva pink Ears- hearing intact Oropharynx- clear Neck- supple Lungs- Clear to ausculation bilaterally, normal work of breathing Heart- Regular rate and rhythm, no murmurs, rubs or gallops GI- soft, NT, ND, + BS Extremities- no clubbing, cyanosis, or edema Skin- no rash or lesion Psych- euthymic mood, full affect Neuro- strength and sensation are intact  Labs    CBC Recent Labs    07/06/19 0337 07/07/19 0433  WBC 13.5* 15.9*  HGB 9.6* 10.5*  HCT 30.9* 33.0*  MCV 101.3* 99.7  PLT 306 354   Basic Metabolic Panel Recent Labs    07/06/19 0337 07/07/19 0433  NA 126* 126*  K  4.3 4.4  CL 93* 92*  CO2 25 23  GLUCOSE 216* 157*  BUN 12 12  CREATININE 0.93 1.01  CALCIUM 8.0* 8.4*  MG 1.9 2.1   Liver Function Tests No results for input(s): AST, ALT, ALKPHOS, BILITOT, PROT, ALBUMIN in the last 72 hours. No results for input(s): LIPASE, AMYLASE in the last 72 hours. Cardiac Enzymes No results for input(s): CKTOTAL, CKMB, CKMBINDEX, TROPONINI in the last 72 hours. BNP Invalid input(s): POCBNP D-Dimer No results for input(s): DDIMER in the last 72 hours. Hemoglobin A1C No results for input(s): HGBA1C in the last 72 hours. Fasting Lipid Panel No results for input(s): CHOL, HDL, LDLCALC, TRIG, CHOLHDL, LDLDIRECT in the last 72 hours. Thyroid Function Tests No results for input(s): TSH, T4TOTAL, T3FREE, THYROIDAB in the last 72 hours.  Invalid input(s): FREET3  Telemetry    NSR until just after 0700 this am, now in VT/VF with rates 170-200s (personally  reviewed)  Radiology    No results found.   Patient Profile     Aaron [name] is a 54 y.o. male admitted with acute MI and cardiogenic shock.  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  Now with refractory VT/VF post VAD.  Assessment & Plan    1.  Sustained VT/VF Recurrent this am.  Coox 52% Agree with resuming lidocaine Agree with rebolus amiodarone.  Continue ranexa Continue to avoid milrinone if able.  With ICM could consider low dose beta blockade if thought he would tolerate. He is on midodrine for soft BP. Electrolytes stable this am. Continue goal of K >3.9 and Mg >1.9. VAD speed per VAD team.   With symptoms, HF team plans to shock patient.   For questions or updates, please contact [place] Please consult [email or web address] for contact info under Cardiology/STEMI.  Signed, Shirley Friar, PA-C  07/07/2019   I have seen, examined the patient, and reviewed the above assessment and plan.  Changes to above are made where necessary.  On exam, ill appearing. After being quiescent for about 48 hours, the patient has developed recurrent sustained VT.  He is now back on IV lidocaine and amiodarone has been rebolused.  The patient is being prepared for urgent defibrillation.  I would advise that we continue IV amiodarone. Continue IV lidocaine another 36 hours before we try to wean.  Ideally we would add beta blocker therapy when clinically able.  This is limited by poor cardiac function.  Co Sign: Thompson Grayer, MD 07/07/2019

## 2019-07-07 NOTE — Progress Notes (Signed)
Night shift transferring patient back into bed from bathroom, patient went into VT then VF. Patient supine in bed and zoll pads placed. HF NP at bedside and aware of situation. Gave orders for 150mg  amio bolus, 228mL NS bolus, and start lidocaine drip at 1mg . Bensimhon arrived and gave orders for 167mcg fentanyl and 6 versed for cardioversion. Patient received one shock at 200J and successfully converted to NSR in 70s. Doppler pressures in 80's, patient currently on NRB until fully awake. Will continue to closely monitor patient.

## 2019-07-08 ENCOUNTER — Inpatient Hospital Stay (HOSPITAL_COMMUNITY): Payer: BC Managed Care – PPO

## 2019-07-08 DIAGNOSIS — R531 Weakness: Secondary | ICD-10-CM

## 2019-07-08 LAB — GLUCOSE, CAPILLARY
Glucose-Capillary: 154 mg/dL — ABNORMAL HIGH (ref 70–99)
Glucose-Capillary: 163 mg/dL — ABNORMAL HIGH (ref 70–99)
Glucose-Capillary: 113 mg/dL — ABNORMAL HIGH (ref 70–99)
Glucose-Capillary: 169 mg/dL — ABNORMAL HIGH (ref 70–99)

## 2019-07-08 LAB — COOXEMETRY PANEL
Carboxyhemoglobin: 1.3 % (ref 0.5–1.5)
Carboxyhemoglobin: 1.2 % (ref 0.5–1.5)
Methemoglobin: 1.2 % (ref 0.0–1.5)
Methemoglobin: 0.9 % (ref 0.0–1.5)
O2 Saturation: 45.4 %
O2 Saturation: 40.6 %
Total hemoglobin: 10.9 g/dL — ABNORMAL LOW (ref 12.0–16.0)
Total hemoglobin: 12.7 g/dL (ref 12.0–16.0)

## 2019-07-08 LAB — LIDOCAINE LEVEL
Lidocaine Lvl: 2.8 ug/mL (ref 1.5–5.0)
Lidocaine Lvl: 15.2 ug/mL (ref 1.5–5.0)
Lidocaine Lvl: 2.7 ug/mL (ref 1.5–5.0)

## 2019-07-08 LAB — BASIC METABOLIC PANEL
Anion gap: 13 (ref 5–15)
Anion gap: 10 (ref 5–15)
BUN: 14 mg/dL (ref 6–20)
BUN: 13 mg/dL (ref 6–20)
CO2: 21 mmol/L — ABNORMAL LOW (ref 22–32)
CO2: 23 mmol/L (ref 22–32)
Calcium: 8.2 mg/dL — ABNORMAL LOW (ref 8.9–10.3)
Calcium: 8.1 mg/dL — ABNORMAL LOW (ref 8.9–10.3)
Chloride: 90 mmol/L — ABNORMAL LOW (ref 98–111)
Chloride: 92 mmol/L — ABNORMAL LOW (ref 98–111)
Creatinine, Ser: 1.01 mg/dL (ref 0.61–1.24)
Creatinine, Ser: 1.03 mg/dL (ref 0.61–1.24)
GFR calc Af Amer: >60 mL/min (ref >60)
GFR calc Af Amer: >60 mL/min (ref >60)
GFR calc non Af Amer: >60 mL/min (ref >60)
GFR calc non Af Amer: >60 mL/min (ref >60)
Glucose, Bld: 227 mg/dL — ABNORMAL HIGH (ref 70–99)
Glucose, Bld: 147 mg/dL — ABNORMAL HIGH (ref 70–99)
Potassium: 4 mmol/L (ref 3.5–5.1)
Potassium: 3.7 mmol/L (ref 3.5–5.1)
Sodium: 124 mmol/L — ABNORMAL LOW (ref 135–145)
Sodium: 125 mmol/L — ABNORMAL LOW (ref 135–145)

## 2019-07-08 LAB — CBC
HCT: 32 % — ABNORMAL LOW (ref 39.0–52.0)
Hemoglobin: 10.4 g/dL — ABNORMAL LOW (ref 13.0–17.0)
MCH: 31.1 pg (ref 26.0–34.0)
MCHC: 32.5 g/dL (ref 30.0–36.0)
MCV: 95.8 fL (ref 80.0–100.0)
Platelets: 389 10*3/uL (ref 150–400)
RBC: 3.34 MIL/uL — ABNORMAL LOW (ref 4.22–5.81)
RDW: 14.1 % (ref 11.5–15.5)
WBC: 17.7 10*3/uL — ABNORMAL HIGH (ref 4.0–10.5)
nRBC: 0 % (ref 0.0–0.2)

## 2019-07-08 LAB — PROTIME-INR
INR: 3.6 — ABNORMAL HIGH (ref 0.8–1.2)
Prothrombin Time: 34.8 seconds — ABNORMAL HIGH (ref 11.4–15.2)

## 2019-07-08 LAB — MAGNESIUM
Magnesium: 2 mg/dL (ref 1.7–2.4)
Magnesium: 2.1 mg/dL (ref 1.7–2.4)

## 2019-07-08 LAB — SODIUM: Sodium: 125 mmol/L — ABNORMAL LOW (ref 135–145)

## 2019-07-08 LAB — LACTATE DEHYDROGENASE: LDH: 490 U/L — ABNORMAL HIGH (ref 98–192)

## 2019-07-08 MED ORDER — MIDAZOLAM HCL 2 MG/2ML IJ SOLN: 6.0000 mg | Freq: Once | INTRAMUSCULAR | Status: AC

## 2019-07-08 MED ORDER — FENTANYL CITRATE (PF) 100 MCG/2ML IJ SOLN
INTRAMUSCULAR | Status: AC
  Administered 2019-07-08: 100 ug
  Filled 2019-07-08: qty 2

## 2019-07-08 MED ORDER — MAGNESIUM SULFATE 2 GM/50ML IV SOLN
2.0000 g | Freq: Once | INTRAVENOUS | Status: AC
  Administered 2019-07-08: 2 g via INTRAVENOUS

## 2019-07-08 MED ORDER — AMIODARONE HCL IN DEXTROSE 360-4.14 MG/200ML-% IV SOLN
INTRAVENOUS | Status: AC
  Filled 2019-07-08: qty 200

## 2019-07-08 MED ORDER — MAGNESIUM SULFATE 2 GM/50ML IV SOLN
INTRAVENOUS | Status: AC
  Filled 2019-07-08: qty 50

## 2019-07-08 MED ORDER — SODIUM CHLORIDE 0.9 % IV BOLUS
250.0000 mL | Freq: Once | INTRAVENOUS | Status: AC
  Administered 2019-07-08: 250 mL via INTRAVENOUS

## 2019-07-08 MED ORDER — TOLVAPTAN 15 MG PO TABS
15.0000 mg | ORAL_TABLET | Freq: Once | ORAL | Status: AC
  Administered 2019-07-08: 15 mg via ORAL
  Filled 2019-07-08: qty 1

## 2019-07-08 MED ORDER — MIDAZOLAM HCL 2 MG/2ML IJ SOLN
INTRAMUSCULAR | Status: AC
  Administered 2019-07-08: 6 mg
  Filled 2019-07-08: qty 6

## 2019-07-08 MED ORDER — VASOPRESSIN 20 UNITS/100 ML INFUSION FOR SHOCK
0.0300 [IU]/min | INTRAVENOUS | Status: DC
  Administered 2019-07-10: 0.03 [IU]/min via INTRAVENOUS
  Filled 2019-07-08 (×3): qty 100

## 2019-07-08 MED ORDER — SODIUM CHLORIDE 0.9 % BOLUS PEDS
250.0000 mL | Freq: Once | INTRAVENOUS | Status: AC
  Administered 2019-07-08: 250 mL via INTRAVENOUS

## 2019-07-08 MED ORDER — FENTANYL CITRATE (PF) 100 MCG/2ML IJ SOLN
100.0000 ug | Freq: Once | INTRAMUSCULAR | Status: AC
  Administered 2019-07-08: 100 ug via INTRAVENOUS

## 2019-07-08 MED ORDER — POTASSIUM CHLORIDE 20 MEQ/15ML (10%) PO SOLN
40.0000 meq | Freq: Once | ORAL | Status: AC
  Administered 2019-07-08: 40 meq via ORAL
  Filled 2019-07-08: qty 30

## 2019-07-08 MED ORDER — MIDAZOLAM HCL 2 MG/2ML IJ SOLN
INTRAMUSCULAR | Status: AC
  Administered 2019-07-08: 4 mg via INTRAVENOUS
  Filled 2019-07-08: qty 6

## 2019-07-08 MED ORDER — PHENYLEPHRINE 40 MCG/ML (10ML) SYRINGE FOR IV PUSH (FOR BLOOD PRESSURE SUPPORT)
PREFILLED_SYRINGE | INTRAVENOUS | Status: AC
  Filled 2019-07-08: qty 10

## 2019-07-08 MED ORDER — LIDOCAINE IN D5W 4-5 MG/ML-% IV SOLN
1.0000 mg/min | INTRAVENOUS | Status: DC
  Administered 2019-07-08: 0.5 mg/min via INTRAVENOUS
  Administered 2019-07-09 – 2019-07-11 (×3): 1 mg/min via INTRAVENOUS
  Filled 2019-07-08 (×3): qty 500

## 2019-07-08 MED ORDER — POTASSIUM CHLORIDE CRYS ER 20 MEQ PO TBCR: 40.0000 meq | EXTENDED_RELEASE_TABLET | Freq: Once | ORAL | Status: DC

## 2019-07-08 NOTE — Progress Notes (Signed)
Occupational Therapy Treatment Patient Details Name: Aaron [name] MRN: [id number] DOB: 1965/05/23 Today's Date: 07/08/2019    History of present illness Pt is 54yo admitted 6/16 with STEMI with multivessel disease. cath with no viable targets for CABG. Pt s/p HeartMate 3 LVAD implantation 6/24. VT 2x on 7/1 s/p DCCV. PMHx: HTN, tobacco use (30pack yrs)   OT comments  Focused on energy conservation during functional ADL, Pt very fatigued from earlier PT ambulation session. Pt able to demonstrate power change at supervision level. Pt shared that he would like to get back to playing more guitar. Pt in bed at end of session. OT will continue to follow acutely.    Follow Up Recommendations  Supervision - Intermittent;Home health OT    Equipment Recommendations  3 in 1 bedside commode    Recommendations for Other Services      Precautions / Restrictions Precautions Precautions: Fall;Sternal Precaution Comments: LVAD Restrictions Other Position/Activity Restrictions: STERNAL PRECAUTIONS       Mobility Bed Mobility Overal bed mobility: Modified Independent Bed Mobility: Supine to Sit;Sit to Supine     Supine to sit: Modified independent (Device/Increase time);HOB elevated Sit to supine: Modified independent (Device/Increase time);HOB elevated      Transfers Overall transfer level: Needs assistance Equipment used: 1 person hand held assist Transfers: Sit to/from Stand Sit to Stand: Min guard         General transfer comment: verbal cues for hands to knees.     Balance Overall balance assessment: Needs assistance Sitting-balance support: No upper extremity supported;Feet supported Sitting balance-Leahy Scale: Good     Standing balance support: During functional activity;No upper extremity supported Standing balance-Leahy Scale: Fair                             ADL either performed or assessed with clinical judgement   ADL Overall ADL's : Needs  assistance/impaired     Grooming: Sitting;Set up                                       Vision       Perception     Praxis      Cognition Arousal/Alertness: Awake/alert Behavior During Therapy: [facility] for tasks assessed/performed;Flat affect Overall Cognitive Status: Within Functional Limits for tasks assessed                                 General Comments: Battery to power module change with supervision        Exercises     Shoulder Instructions       General Comments      Pertinent Vitals/ Pain       Pain Assessment: No/denies pain  Home Living                                          Prior Functioning/Environment              Frequency  Min 2X/week        Progress Toward Goals  OT Goals(current goals can now be found in the care plan section)  Progress towards OT goals: Progressing toward goals  Acute Rehab OT Goals Patient Stated Goal: to get  back home to his dog "[name]" OT Goal Formulation: With patient Time For Goal Achievement: 07/11/19 Potential to Achieve Goals: Good  Plan Discharge plan remains appropriate    Co-evaluation                 AM-PAC OT "6 Clicks" Daily Activity     Outcome Measure   Help from another person eating meals?: None Help from another person taking care of personal grooming?: A Little Help from another person toileting, which includes using toliet, bedpan, or urinal?: A Little Help from another person bathing (including washing, rinsing, drying)?: A Little Help from another person to put on and taking off regular upper body clothing?: A Little Help from another person to put on and taking off regular lower body clothing?: A Little 6 Click Score: 19    End of Session    OT Visit Diagnosis: Unsteadiness on feet (R26.81);Muscle weakness (generalized) (M62.81)   Activity Tolerance Patient limited by fatigue   Patient Left in bed;with call bell/phone  within reach;with family/visitor present   Nurse Communication Mobility status        Time: 1005-1026 OT Time Calculation (min): 21 min  Charges: OT General Charges $OT Visit: 1 Visit OT Treatments $Self Care/Home Management : 8-22 mins  Jesse Sans OTR/L Acute Rehabilitation Services Pager: [phone] Office: [place] 07/08/2019, 1:50 PM

## 2019-07-08 NOTE — Progress Notes (Addendum)
Patient ID: Aaron [name], male   DOB: Sep 09, 1965, 54 y.o.   MRN: [id number]     Advanced Heart Failure Rounding Note  PCP-Cardiologist: No primary care provider on file.   Subjective:    - HM3 placement 6/24  - Had 2 episodes of VF requiring emergent DC-CV on 07/02/19 - Recurrent VF on 7/2 and broke spontaneously.  - 7/3 Recurrent VF at 2a.-> emergent DC-CV.  - 07/07/2019 VT/Torsades -> repeat DC_CV  Anxious today. Worried about getting shocked again.   LVAD Interrogation HM III: Speed: 5000 Flow: 4   PI: 3.6 Power: 3  Rare PI events.       Objective:   Weight Range: 96.5 kg Body mass index is 31.42 kg/m.   Vital Signs:   Temp:  [97.5 F (36.4 C)-99.2 F (37.3 C)] 99.2 F (37.3 C) (07/07 0400) Pulse Rate:  [37-111] 69 (07/07 0600) Resp:  [14-34] 30 (07/07 0600) BP: (81-144)/(52-102) 96/79 (07/07 0600) SpO2:  [66 %-100 %] 98 % (07/07 0600) Weight:  [96.5 kg] 96.5 kg (07/07 0514) Last BM Date: 07/06/19  Weight change: Filed Weights   07/06/19 0500 07/07/19 0500 07/08/19 0514  Weight: 92.5 kg 94.8 kg 96.5 kg    Intake/Output:   Intake/Output Summary (Last 24 hours) at 07/08/2019 0719 Last data filed at 07/08/2019 0500 Gross per 24 hour  Intake 2037.03 ml  Output 300 ml  Net 1737.03 ml      Physical Exam   Maps 70-80s   CVP 10   Physical Exam: GENERAL: No acute distress. HEENT: normal  NECK: Supple, JVP 9-10   2+ bilaterally, no bruits.  No lymphadenopathy or thyromegaly appreciated.   CARDIAC:  Mechanical heart sounds with LVAD hum present.  LUNGS:  Clear to auscultation bilaterally.  ABDOMEN:  Soft, round, nontender, positive bowel sounds x4.     LVAD exit site: well-healed and incorporated.  Dressing dry and intact.  No erythema or drainage.  Stabilization device present and accurately applied.  Driveline dressing is being changed daily per sterile technique. EXTREMITIES:  Warm and dry, no cyanosis, clubbing, rash or edema . RUE PICC  NEUROLOGIC:  Alert  and oriented x 3.    No aphasia.  No dysarthria.  Affect pleasant.       Telemetry   SR 60s no VT    Labs    CBC Recent Labs    07/07/19 0433 07/08/19 0434  WBC 15.9* 17.7*  HGB 10.5* 10.4*  HCT 33.0* 32.0*  MCV 99.7 95.8  PLT 345 628   Basic Metabolic Panel Recent Labs    07/07/19 0433 07/08/19 0434  NA 126* 124*  K 4.4 4.0  CL 92* 90*  CO2 23 21*  GLUCOSE 157* 227*  BUN 12 14  CREATININE 1.01 1.01  CALCIUM 8.4* 8.2*  MG 2.1 2.0   Liver Function Tests No results for input(s): AST, ALT, ALKPHOS, BILITOT, PROT, ALBUMIN in the last 72 hours. No results for input(s): LIPASE, AMYLASE in the last 72 hours. Cardiac Enzymes No results for input(s): CKTOTAL, CKMB, CKMBINDEX, TROPONINI in the last 72 hours.  BNP: BNP (last 3 results) Recent Labs    06/18/19 0119 06/26/19 0445 07/02/19 0531  BNP 1,068.5* 1,301.1* 1,646.4*    ProBNP (last 3 results) No results for input(s): PROBNP in the last 8760 hours.   D-Dimer No results for input(s): DDIMER in the last 72 hours. Hemoglobin A1C No results for input(s): HGBA1C in the last 72 hours. Fasting Lipid Panel No results for  input(s): CHOL, HDL, LDLCALC, TRIG, CHOLHDL, LDLDIRECT in the last 72 hours. Thyroid Function Tests No results for input(s): TSH, T4TOTAL, T3FREE, THYROIDAB in the last 72 hours.  Invalid input(s): FREET3  Other results:   Imaging    No results found.   Medications:     Scheduled Medications: . aspirin EC  81 mg Oral Daily  . atorvastatin  80 mg Oral Daily  . bisacodyl  10 mg Oral Daily   Or  . bisacodyl  10 mg Rectal Daily  . Chlorhexidine Gluconate Cloth  6 each Topical Daily  . clonazePAM  0.5 mg Oral BID  . colchicine  0.6 mg Oral BID  . digoxin  0.125 mg Oral Daily  . docusate sodium  200 mg Oral Daily  . doxycycline  100 mg Oral Q12H  . feeding supplement (ENSURE ENLIVE)  237 mL Oral TID BM  . folic acid  1 mg Oral Daily  . gabapentin  300 mg Oral TID  . insulin  aspart  0-15 Units Subcutaneous TID WC  . mouth rinse  15 mL Mouth Rinse BID  . midodrine  5 mg Oral TID WC  . multivitamin with minerals  1 tablet Oral Daily  . pantoprazole  40 mg Oral Daily  . ranolazine  500 mg Oral BID  . sodium chloride flush  10-40 mL Intracatheter Q12H  . thiamine  100 mg Oral Daily  . Warfarin - Pharmacist Dosing Inpatient   Does not apply q1600    Infusions: . sodium chloride    . amiodarone 60 mg/hr (07/08/19 0500)  . lactated ringers    . lidocaine 1 mg/min (07/08/19 0500)    PRN Medications: acetaminophen, ALPRAZolam, bisacodyl, calcium carbonate, dextrose, fentaNYL (SUBLIMAZE) injection, ondansetron (ZOFRAN) IV, oxyCODONE, pneumococcal 23 valent vaccine, sodium chloride flush, sodium chloride flush, traMADol, traZODone    Patient Profile   Shayaan Parke Bither --post MI cardiogenic shock/ optimization prior to anticipated CABG, at the request of Dr. Marlou Porch, Cardiology.    Assessment/Plan   1. Sustained VT/VF  - developed VF on 7/1 x 2 requiring emergent DC-CV - On 7/2 had self-terminating episode - On 7/3 persistent VF/torsades -> DC-CV -On 7/6  VF/torsades.  - Plan to continue lidocaine 36 hours per EP  - Continue lidocaine + amiodarone drip.  - Discussed with EP at bedside.  - keep K> 4.0 Mg >2.0 .  - VAD speed reduced to 5000 - Keep off inotropes  2. Acute Anterolateral STEMI/ Multivessel CAD - Emergent cath showed severe multivessel disease w/ 75% mid- distal LM, 100% prox LAD, not amendable to PCI (unable to cross w/ guide wire, probable CTO), 100% prox LCx (probable CTO) and 100% ostial RCA (probable CTO). There were mild to moderate collaterals right to left to the LAD, minimal right to right collaterals an minimal left to left collaterals. LVEF 25% - Hs trop peaked 27,000. - No targets for CABG / PCI.   - S/P HMIII VAD - suspect VF is ischemically-mediated. Ranexa seems to be helping. Will continue  3.  Acute Systolic Heart Failure  --->Cardiogenic Shock - 2/2 acute MI. LVEF by LVG 25% - IABP out 6/18 but replaced 6/23. Removed in OR on 6/24  - HMIII LVAD  Placed 6/24 - Extubated 6/25  - Was on milrinone for RV failure. Now off due to VF.  - Co-ox 45%. Repeat now.  - Continue digoxin.  - INR 3.6  Off heparin  - LDH 347>>459>463>517>554->460 -> 375-->383->490   - MAPs  low off spiro and sildenafil. Continue  Midodrine - Continue gabapentin 300 mg tid for pocket pain + back-up fentanyl - Ramp ECHO soon.  - VAD interrogated personally. Parameters stable.  4. Tobacco Abuse - 30 pack years  - smoking cessation advised  - Will need to quit if to be considered for heart transplant in the future   5. Renal mass = RCC - MRI abdomen- 1.8 cm right renal lesion, most consistent with renal cell carcinoma. No evidence of renal vein involvement or nodal metastasis. - Discussed w/ Urology and R RCC is small and will just require surveillance.  Ok to proceed with VAD from their standpoint.   8. Hyponatremia  -Sodium 131 -> 126->-->126 -->124 .  -Restrict free water.   9. Sleep Apnea - frequent apnea at night  - needs formal sleep study   - CPAP ordered while inpatient  10. Hypokalemia K stable today   11. ID WBC trending up. Watch closely. May need add antibiotics.  T max 99.2   -Continue to mobilize. Sodium low today. Restrict free water.  - Set up Ramp ECHO.   Amy Clegg NP-C  07/08/2019 7:19 AM   Agree with above.   Had VF again yesterday am. Lido restarted. Now on amio, lido and ranexa. Rhythm stable. CVP 10-11. Co-ox 46%. Sodium 124.   Feels anxious. Mild chest discomfort. WBC climbing. Tm 99.2 On doxy for superficial DL infection. INR 3.6  General:  Lying in bed NAD.  HEENT: normal  Neck: supple. JVP to jaw  Carotids 2+ bilat; no bruits. No lymphadenopathy or thryomegaly appreciated. Cor: LVAD hum.  Lungs: Clear. Abdomen: soft, nontender, non-distended. No hepatosplenomegaly. No bruits or masses.  Good bowel sounds. Driveline site clean. Anchor in place.  Extremities: no cyanosis, clubbing, rash. Warm tr edema  Neuro: alert & oriented x 3. No focal deficits. Moves all 4 without problem   Rhythm stable overnight remains very tenuous. Will continue lido. amio and Ranexa. Appreciate EP input. Will give one dose tolvaptan. Will check CXR. Encourage pulmonary toilet.   CRITICAL CARE Performed by: Glori Bickers  Total critical care time: 35 minutes  Critical care time was exclusive of separately billable procedures and treating other patients.  Critical care was necessary to treat or prevent imminent or life-threatening deterioration.  Critical care was time spent personally by me (independent of midlevel providers or residents) on the following activities: development of treatment plan with patient and/or surrogate as well as nursing, discussions with consultants, evaluation of patient's response to treatment, examination of patient, obtaining history from patient or surrogate, ordering and performing treatments and interventions, ordering and review of laboratory studies, ordering and review of radiographic studies, pulse oximetry and re-evaluation of patient's condition.  Glori Bickers, MD  8:06 AM

## 2019-07-08 NOTE — Progress Notes (Signed)
Patient c/o increasing dizziness; in NSR, and stable VAD numbers. RN present in room, patient now in VT but remains alert. Zoll pads on patient replaced. HF NP at bedside. Verbal order for 150mg  bolus amio, 546mL NS bolus, restart Lidocaine gtt at 0.5mg ; (1530 Lidocaine send-off level pending). Bensimhon at bedside. 154mcg fentanyl and 6mg  versed given in preparation for DC-CV. Patient received one shock at 200J and successfully converted to NSR in 70's. Doppler pressure 98, VAD numbers stable. Respiratory at bedside with BVM to assist with maintenance of airway until patient less drowsy. Patient now on 6L [employer]. Will continue to closely monitor.

## 2019-07-08 NOTE — Progress Notes (Signed)
LVAD Coordinator Rounding Note:  Transferred from [facility] ED on 06/17/19 with STEMI. Cath revealed multi-vessel CAD, wire would not cross to open LAD; IABP placed and pt transferred here for evaluation for CABG.   Dr. Orvan Seen evaluated and found he was not amenable to surgical or percutaneous revascularization due to poor targets. With severe depressed LV function and little coronary perfusion, LVAD was recommended.   HM III LVAD implanted on 06/25/19 by Dr. Orvan Seen under Destination Therapy criteria due to recent smoking history.  Had VF again yesterday am. Lido restarted. Now on amio, lido and ranexa. Rhythm stable. CVP 10-11. Co-ox 46%. Sodium 124.   Feels anxious. Mild chest discomfort. WBC climbing. Tm 99.2 On doxy for superficial DL infection. INR 3.6  Vital signs: Temp: 98.8 HR: 66 Doppler Pressure: 80 BP: 96/80 (87) O2 Sat: 92% on RA Wt: 207>225.7>222.6>214.3>213.4>203.9>205.3>209>212.7 lbs  LVAD parameters: Speed:  5000 Flow: 3.9 Power:  3.4w PI: 4.2 Hct: 32  Alarms: none Events: 2 PI events today  Fixed speed: 5000 Low speed limit: 4700   Drive Line:  Existing VAD dressing removed and site care performed using sterile technique by pts sister-Joanne. Drive line site cleaned with Chlora prep applicator x2, allowed to dry, and gauze dressing with silver strip re-applied.  Exit site with suture remaining, the velour is fully implanted at the exit site. No tissue ingrowth noted. Small amount of redness noted at exit site. Minimal fatty necrosis drainage noted on dressing, no tenderness, foul odor or rash noted.  Drive line anchor replaced. Mechele Claude checked off to perform dressing change independently. Continue daily dressing changes. Next dressing change due 07/09/19.          Counter Incision: Clean, dry, intact. Mild redness noted at site. Dermabond intact.   Labs:  LDH trend: 435>686>168>372>902>111>552>080>223  INR trend:  1.4>1.3>1.1>1.2>1.4>1.5>1.7>2.7>3.6  Anticoagulation Plan: -INR Goal:  2.0 - 2.5 -ASA Dose: 325 mg daily until INR therapeutic  Blood Products:  -Intra-op - 06/25/19>>6 units FFP   Device: N/A  Drips: Milrinone 0.125 mcg/kg/min>> off Lasix 8 mg/hr-off  Amiodarone 30mg /hr  Lidocaine 1 mg/min   Arrythmias: was on Amiodarone pre implant for NSVT  Respiratory: extubated 06/26/19  Nitric Oxide: off 06/26/19  VAD Education: 1. Sister-Joanne did a good job with dressing change today. She is checked off to perform independently.  2. Patient tired and unable to stay awake for teaching today.  3. Pt discharge binder delivered to the room today for pt and sisters review. Pt condition is tenous. Will hold off on formal education.  Plan/Recommendations:  1. Call VAD pager if any VAD equipment or drive line issues. 2. Daily dressing changes per VAD Coordinator, Nurse Davonna Belling, or pts sister Mechele Claude.  Tanda Rockers RN [place] Coordinator  Office: [phone]  24/7 Pager: [phone]

## 2019-07-08 NOTE — Progress Notes (Signed)
Patient ID: Aaron [name], male   DOB: 1965/02/17, 54 y.o.   MRN: [id number]    This NP visited patient at the bedside as a follow up for palliative medicine needs and emotional support.  Patient is resting comfortably in bed, however he tells me his chest is a little sore from "everything that happened this morning"  Patient is POD 12 --HM3 placement 6/24 and he is doing fairly  well.  Today, however was difficult for him patient had sustained V. tach requiring emergent cardioversion. Emotional support offered.  Aaron [name]'s mood is positive.  Education offered regarding coping strategies specific to ongoing positive thinking and positive affirmations and he will continue to "put 1 foot in front of the other"      Spoke to his sister by telephone to offer emotional support, and answer questions or concerns.  She does not verbalize any questions or concerns, and tells me she has family support.  She verbalizes appreciation for the phone call.  PMT will continue to support holistically  Total time spent on the unit was 25 minutes.  Greater than 50% of the time was spent in counseling and coordination of care  Wadie Lessen NP  Palliative Medicine Team Team Phone # [phone] Pager [phone]

## 2019-07-08 NOTE — Progress Notes (Addendum)
   Called by nursing staff.  Lidocaine level high earlier today. Lidocaine stopped.  Repeat Lidocaine level  Pending.   Back in Torsades with rate 190s. VAD Parameters- Flow 2.9  Speed  5000 PI 3.3 Power 3.2  Map s 80s   Given 250 NS bolus.  Given 150 mg amio bolus   Zoll Pads placed. RT at bedside.   After sedation.    Dr Haroldine Laws shocked x 1 200 J ---> NSR 70s --VAD Flow 3.2 Speed 5000  PI 8.9 Power 3.3  Restarting lidocaine 0.5 mg/hour.  Amy Clegg NP-C  5:54 PM  Agree with above.  Lidocaine stopped earlier today for elevated level.   He developed recurrent VT at 530. Associated with lightheadedness and CP. Given amio bolus with no effect. Supported with IVF. MAPs stable. VAD flows moderately decreased but no low flows.     RT brought to the bedside. He received moderate sedation and underwent DC-CV for VT back to sinus rhythm.   Post shock vitals stabilized. Airway stable.   IV lidocaine restarted at 0.5  Sister notified by phone.  Additional CCT 35 mins not including DC-CV  Glori Bickers, MD  6:17 PM

## 2019-07-08 NOTE — Plan of Care (Signed)
  Problem: Clinical Measurements: Goal: Respiratory complications will improve Outcome: Progressing   Problem: Clinical Measurements: Goal: Cardiovascular complication will be avoided Outcome: Not Progressing Note: Pt had another episode of recurrent VT/VF requiring DC-CV

## 2019-07-08 NOTE — Progress Notes (Signed)
Physical Therapy Treatment Patient Details Name: Aaron [name] MRN: [id number] DOB: 21-Jun-1965 Today's Date: 07/08/2019    History of Present Illness Pt is 54yo admitted 6/16 with STEMI with multivessel disease. cath with no viable targets for CABG. Pt s/p HeartMate 3 LVAD implantation 6/24. VT 2x on 7/1 s/p DCCV. PMHx: HTN, tobacco use (30pack yrs)    PT Comments    Pt making steady progress. Continues to report dizziness with up but doesn't seem to worsen with ambulation. Pt requires standing rest break but doesn't have to sit down.    Follow Up Recommendations  Home health PT;Supervision/Assistance - 24 hour     Equipment Recommendations  Other (comment) (rollator)    Recommendations for Other Services       Precautions / Restrictions Precautions Precautions: Fall;Sternal Precaution Comments: LVAD    Mobility  Bed Mobility Overal bed mobility: Modified Independent Bed Mobility: Supine to Sit;Sit to Supine     Supine to sit: Modified independent (Device/Increase time);HOB elevated Sit to supine: Modified independent (Device/Increase time);HOB elevated      Transfers Overall transfer level: Needs assistance Equipment used: 4-wheeled walker Transfers: Sit to/from Stand Sit to Stand: Min guard         General transfer comment: verbal cues for hands to knees.   Ambulation/Gait Ambulation/Gait assistance: Min guard Gait Distance (Feet): 225 Feet Assistive device: 4-wheeled walker Gait Pattern/deviations: Step-through pattern;Decreased stride length Gait velocity: decr Gait velocity interpretation: 1.31 - 2.62 ft/sec, indicative of limited community ambulator General Gait Details: assist for lines/safety. 1 standing rest break. Reports dizziness throughout. HR stable. SpO2 93% on RA after amb   Stairs             Wheelchair Mobility    Modified Rankin (Stroke Patients Only)       Balance Overall balance assessment: Needs  assistance Sitting-balance support: No upper extremity supported;Feet supported Sitting balance-Leahy Scale: Good     Standing balance support: During functional activity;No upper extremity supported Standing balance-Leahy Scale: Fair                              Cognition Arousal/Alertness: Awake/alert Behavior During Therapy: WFL for tasks assessed/performed Overall Cognitive Status: Within Functional Limits for tasks assessed                                        Exercises      General Comments        Pertinent Vitals/Pain Pain Assessment: No/denies pain    Home Living                      Prior Function            PT Goals (current goals can now be found in the care plan section) Progress towards PT goals: Progressing toward goals    Frequency    Min 4X/week      PT Plan Current plan remains appropriate    Co-evaluation              AM-PAC PT "6 Clicks" Mobility   Outcome Measure  Help needed turning from your back to your side while in a flat bed without using bedrails?: A Little Help needed moving from lying on your back to sitting on the side of a flat bed without using bedrails?: A Little Help  needed moving to and from a bed to a chair (including a wheelchair)?: A Little Help needed standing up from a chair using your arms (e.g., wheelchair or bedside chair)?: A Little Help needed to walk in hospital room?: A Little Help needed climbing 3-5 steps with a railing? : A Little 6 Click Score: 18    End of Session   Activity Tolerance: Patient tolerated treatment well Patient left: with call bell/phone within reach;in bed;with family/visitor present Nurse Communication: Mobility status PT Visit Diagnosis: Other abnormalities of gait and mobility (R26.89)     Time: 2924-4628 PT Time Calculation (min) (ACUTE ONLY): 28 min  Charges:  $Gait Training: 23-37 mins                     [place] Pager [phone] Office [place] 07/08/2019, 10:40 AM

## 2019-07-08 NOTE — CV Procedure (Signed)
°  °   DIRECT CURRENT CARDIOVERSION  NAME:  Aaron [name]                            MRN:   [id number] DOB:  07-28-1965                                 ADMIT DATE: 06/17/2019   INDICATIONS: Ventricular tachycardia   PROCEDURE:   Developed recurrent VT  Patient became progressively dizzy and had chest pain. Given IV amio and lidocaine boluses without effect.   RT in room.  I gave him a total of versed 6mg  and fentanyl 150mcg with blow-by bag mask support. When patient was moderately sedately. I performed emergent cardioversion at 200J with prompt conversion to NSR and improved hemodynamics and VAD flows.   Patient then developed recurrent VT and was shocked again back to NSR.    Lidocaine gtt increased to 1 and bolused with mag.     Glori Bickers, MD  9:55 PM

## 2019-07-08 NOTE — Progress Notes (Signed)
RT called to room due to patient needing to be cardioverted.  Patient was cardioverted twice and BMV was used to help patient with his airway and oxygenation until he regained consciousness.  Patient O2 saturation began to decline after cardioversion and BiPAP was initiated until patient was fully awake.

## 2019-07-08 NOTE — Progress Notes (Signed)
CSW met at bedside with patient. He shared that he has been having trouble sleeping and noted that "I have been having strange dreams". Patient states his sister visits daily and has been very supportive. CSW provided support and will continue to follow throughout implant hospitalization. Raquel Sarna, [place], [place]

## 2019-07-08 NOTE — Progress Notes (Signed)
   LVAD team note   Patient developed recurrent VT this evening. I was paged emergently. MAPs reportedly 50. I gave 250cc NS and ordered VP over the phone.  I came to bedside emergently. ICU charge RN, RT and bedside RN at bedside.   MAP checked personally and was 82. I performed bedside echo. Poor images but inflow cannula seemed well placed. No effusion.   Patient had more chest pain. Given IV amio and lidocaine boluses without effect.   I gave him a total of versed 6mg  and fentanyl 177mcg with blow-by bag mask support. When patient was moderately sedately. I performed emergent cardioversion at 200J with prompt conversion to NSR and improved hemodynamics and VAD flows.   Patient then developed recurrent VT and was shocked again back to NSR.    Lidocaine gtt increased to 1 and bolused with mag. Patient placed on Bipap.   I stayed in room with RT and bedside RN until patient regained full consciousness.   MAPS and rhythm stable.   Total CCT time not including DC-CV > 90 mins.   Glori Bickers, MD  10:01 PM

## 2019-07-08 NOTE — Progress Notes (Signed)
CRITICAL VALUE ALERT  Critical Value:  Lidocaine 15.2  Date & Time Notied:  07/08/19 @ 6063  Provider Notified: Darrick Grinder, NP  Orders Received/Actions taken: orders pending

## 2019-07-08 NOTE — Progress Notes (Signed)
[place] for Coumadin Indication: LVAD  Allergies  Allergen Reactions  . Codeine Hives    Patient Measurements: Height: 5\' 9"  (175.3 cm) Weight: 96.5 kg (212 lb 11.9 oz) IBW/kg (Calculated) : 70.7 Heparin Dosing Weight: ~ 92 kg  Vital Signs: Temp: 98.8 F (37.1 C) (07/07 0801) Temp Source: Oral (07/07 0801) BP: 96/79 (07/07 0600) Pulse Rate: 59 (07/07 0730)  Labs: Recent Labs    07/06/19 0337 07/06/19 0337 07/07/19 0433 07/08/19 0434  HGB 9.6*   < > 10.5* 10.4*  HCT 30.9*  --  33.0* 32.0*  PLT 306  --  345 389  LABPROT 24.4*  --  28.1* 34.8*  INR 2.3*  --  2.7* 3.6*  CREATININE 0.93  --  1.01 1.01   < > = values in this interval not displayed.    Estimated Creatinine Clearance: 94.7 mL/min (by C-G formula based on SCr of 1.01 mg/dL).   Medical History: Past Medical History:  Diagnosis Date  . Acute anterolateral wall MI ([place])   . CAD, multiple vessel   . Hypertension   . Ischemic cardiomyopathy   . Systolic heart failure ([place])   . Tobacco abuse     Medications:  Infusions:  . sodium chloride    . amiodarone 60 mg/hr (07/08/19 0720)  . lactated ringers    . lidocaine 1 mg/min (07/08/19 0500)    Assessment: 54 yo male s/p LVAD HM3 placement 6/24.  Pharmacy asked to dose  anticoagulation with IV heparin and Coumadin.  No overt bleeding or complications noted.    INR today is supratherapeutic at 3.6. Hgb stable 10.4, plt stable 389. No s/sx of bleeding overnight. LDH trending up to 490.  Goal of Therapy:  INR 2-2.5  Monitor platelets by anticoagulation protocol: Yes   Plan:  -Hold warfarin tonight -Daily INR, CBC, LDH  Antonietta Jewel, PharmD, [place] Pharmacist  Phone: [phone] 07/08/2019 8:10 AM  Please check AMION for all [place] phone numbers After 10:00 PM, call [place] [phone]

## 2019-07-08 NOTE — Progress Notes (Addendum)
Electrophysiology Rounding Note  Patient Name: Aaron [name] Date of Encounter: 07/08/2019   Subjective   No further VT/VF post shock yesterday am. Remains on amio and lidocaine gtts.   Still complains of slight pressure in the chest.   Inpatient Medications    Scheduled Meds: . aspirin EC  81 mg Oral Daily  . atorvastatin  80 mg Oral Daily  . bisacodyl  10 mg Oral Daily   Or  . bisacodyl  10 mg Rectal Daily  . Chlorhexidine Gluconate Cloth  6 each Topical Daily  . clonazePAM  0.5 mg Oral BID  . colchicine  0.6 mg Oral BID  . digoxin  0.125 mg Oral Daily  . docusate sodium  200 mg Oral Daily  . doxycycline  100 mg Oral Q12H  . feeding supplement (ENSURE ENLIVE)  237 mL Oral TID BM  . folic acid  1 mg Oral Daily  . gabapentin  300 mg Oral TID  . insulin aspart  0-15 Units Subcutaneous TID WC  . mouth rinse  15 mL Mouth Rinse BID  . midodrine  5 mg Oral TID WC  . multivitamin with minerals  1 tablet Oral Daily  . pantoprazole  40 mg Oral Daily  . ranolazine  500 mg Oral BID  . sodium chloride flush  10-40 mL Intracatheter Q12H  . thiamine  100 mg Oral Daily  . Warfarin - Pharmacist Dosing Inpatient   Does not apply q1600   Continuous Infusions: . sodium chloride    . amiodarone 60 mg/hr (07/08/19 0500)  . lactated ringers    . lidocaine 1 mg/min (07/08/19 0500)   PRN Meds: acetaminophen, ALPRAZolam, bisacodyl, calcium carbonate, dextrose, fentaNYL (SUBLIMAZE) injection, ondansetron (ZOFRAN) IV, oxyCODONE, pneumococcal 23 valent vaccine, sodium chloride flush, sodium chloride flush, traMADol, traZODone   Vital Signs    Vitals:   07/08/19 0432 07/08/19 0503 07/08/19 0514 07/08/19 0600  BP: (!) 144/72 101/85  96/79  Pulse: 72 60  69  Resp: 16 (!) 23  (!) 30  Temp:      TempSrc:      SpO2: 90% 91%  98%  Weight:   96.5 kg   Height:        Intake/Output Summary (Last 24 hours) at 07/08/2019 0709 Last data filed at 07/08/2019 0500 Gross per 24 hour  Intake  2037.03 ml  Output 300 ml  Net 1737.03 ml   Filed Weights   07/06/19 0500 07/07/19 0500 07/08/19 0514  Weight: 92.5 kg 94.8 kg 96.5 kg    Physical Exam    GEN- The patient is fatigued appearing, alert and oriented x 3 today.   Head- normocephalic, atraumatic Eyes-  Sclera clear, conjunctiva pink Ears- hearing intact Oropharynx- clear Neck- supple Lungs- Clear to ausculation bilaterally, normal work of breathing Heart- +LVAD hum GI- soft, NT, ND, + BS Extremities- no clubbing, cyanosis, or edema Skin- no rash or lesion Psych- flat affect Neuro- strength and sensation are intact  Labs    CBC Recent Labs    07/07/19 0433 07/08/19 0434  WBC 15.9* 17.7*  HGB 10.5* 10.4*  HCT 33.0* 32.0*  MCV 99.7 95.8  PLT 345 993   Basic Metabolic Panel Recent Labs    07/07/19 0433 07/08/19 0434  NA 126* 124*  K 4.4 4.0  CL 92* 90*  CO2 23 21*  GLUCOSE 157* 227*  BUN 12 14  CREATININE 1.01 1.01  CALCIUM 8.4* 8.2*  MG 2.1 2.0   Liver Function Tests  No results for input(s): AST, ALT, ALKPHOS, BILITOT, PROT, ALBUMIN in the last 72 hours. No results for input(s): LIPASE, AMYLASE in the last 72 hours. Cardiac Enzymes No results for input(s): CKTOTAL, CKMB, CKMBINDEX, TROPONINI in the last 72 hours.   Telemetry    NSR 60s since shock for VT/VF yesterday am (personally reviewed)  Radiology    No results found.  Patient Profile     Aaron Gaertner Noguesis a 54 y.o.maleadmitted with acute MI and cardiogenic shock. 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 Now with refractory VT/VF post VAD.  Assessment & Plan    1.Sustained VT/VF Quiescent s/p shock yesterday am.  Remains on lidocaine and amiodarone gtt. Electrolytes stable.  Would continue lidocaine for 24 more hours and again try to transition to mexiletine tomorrow am.  Initial coox 45% this am.  With heavy CAD burden would consider low dose beta blockade as soon as thought he would tolerate  He is on midodrine  for soft BP. VAD speed per VAD team.   For questions or updates, please contact [facility] Please consult [email or web address] for contact info under Cardiology/STEMI.  Signed, Shirley Friar, PA-C  07/08/2019, 7:09 AM    I have seen, examined the patient, and reviewed the above assessment and plan.  Changes to above are made where necessary.  On exam, RRR.  His VT/VF did not return overnight.  I would advise that we continue current therapy with lidocaine and amiodarone for another 24 hours.  We can revisit switching from mexiletine to lidocaine tomorrow.  Ideally, we would add beta blocker therapy when able. Prognosis is guarded currently.  Ep to follow  Co Sign: Thompson Grayer, MD 07/08/2019

## 2019-07-08 NOTE — CV Procedure (Signed)
    DIRECT CURRENT CARDIOVERSION  NAME:  Aaron [name]                            MRN:   [id number] DOB:  31-Jan-1965                                 ADMIT DATE: 06/17/2019   INDICATIONS: Ventricular tachycardia   PROCEDURE:   Developed recurrent VT  Patient became progressively dizzy and had chest pain. Given IV amio bolus without effect.   RT in room.  I gave him a total of versed 6mg  and fentanyl 121mcg with blow-by bag mask support. When patient was moderately sedately. I performed emergent cardioversion at 200J with prompt conversion to NSR and improved hemodynamics and VAD flows.    Glori Bickers, MD  6:19 PM

## 2019-07-08 NOTE — Progress Notes (Signed)
RT note-Patient needed to be cardioverted, post sedation patient was BMV to maintain patent airway, patient now on 6l/min [place], continue to monitor.

## 2019-07-09 ENCOUNTER — Inpatient Hospital Stay (HOSPITAL_COMMUNITY): Payer: BC Managed Care – PPO

## 2019-07-09 ENCOUNTER — Inpatient Hospital Stay (HOSPITAL_COMMUNITY)
Admission: AD | Disposition: A | Discharge status: Rehabilitation Facility | Payer: Self-pay | Source: Other Acute Inpatient Hospital | Attending: Cardiothoracic Surgery

## 2019-07-09 ENCOUNTER — Other Ambulatory Visit: Payer: Self-pay | Admitting: Cardiothoracic Surgery

## 2019-07-09 DIAGNOSIS — Z9911 Dependence on respirator [ventilator] status: Secondary | ICD-10-CM

## 2019-07-09 DIAGNOSIS — J189 Pneumonia, unspecified organism: Secondary | ICD-10-CM

## 2019-07-09 DIAGNOSIS — J9601 Acute respiratory failure with hypoxia: Secondary | ICD-10-CM

## 2019-07-09 DIAGNOSIS — I5021 Acute systolic (congestive) heart failure: Secondary | ICD-10-CM

## 2019-07-09 HISTORY — PX: RIGHT HEART CATH: CATH118263

## 2019-07-09 HISTORY — PX: IABP INSERTION: CATH118242

## 2019-07-09 LAB — CBC
HCT: 34.1 % — ABNORMAL LOW (ref 39.0–52.0)
HCT: 30.7 % — ABNORMAL LOW (ref 39.0–52.0)
Hemoglobin: 10.9 g/dL — ABNORMAL LOW (ref 13.0–17.0)
Hemoglobin: 9.9 g/dL — ABNORMAL LOW (ref 13.0–17.0)
MCH: 31.1 pg (ref 26.0–34.0)
MCH: 30.9 pg (ref 26.0–34.0)
MCHC: 32 g/dL (ref 30.0–36.0)
MCHC: 32.2 g/dL (ref 30.0–36.0)
MCV: 97.2 fL (ref 80.0–100.0)
MCV: 95.9 fL (ref 80.0–100.0)
Platelets: 413 10*3/uL — ABNORMAL HIGH (ref 150–400)
Platelets: 355 10*3/uL (ref 150–400)
RBC: 3.51 MIL/uL — ABNORMAL LOW (ref 4.22–5.81)
RBC: 3.2 MIL/uL — ABNORMAL LOW (ref 4.22–5.81)
RDW: 14.7 % (ref 11.5–15.5)
RDW: 15 % (ref 11.5–15.5)
WBC: 22.9 10*3/uL — ABNORMAL HIGH (ref 4.0–10.5)
WBC: 25.7 10*3/uL — ABNORMAL HIGH (ref 4.0–10.5)
nRBC: 0 % (ref 0.0–0.2)
nRBC: 0.1 % (ref 0.0–0.2)

## 2019-07-09 LAB — GLUCOSE, CAPILLARY
Glucose-Capillary: 238 mg/dL — ABNORMAL HIGH (ref 70–99)
Glucose-Capillary: 117 mg/dL — ABNORMAL HIGH (ref 70–99)
Glucose-Capillary: 117 mg/dL — ABNORMAL HIGH (ref 70–99)
Glucose-Capillary: 158 mg/dL — ABNORMAL HIGH (ref 70–99)
Glucose-Capillary: 154 mg/dL — ABNORMAL HIGH (ref 70–99)

## 2019-07-09 LAB — HEPATIC FUNCTION PANEL
ALT: 1377 U/L — ABNORMAL HIGH (ref 0–44)
AST: 1854 U/L — ABNORMAL HIGH (ref 15–41)
Albumin: 2.5 g/dL — ABNORMAL LOW (ref 3.5–5.0)
Alkaline Phosphatase: 146 U/L — ABNORMAL HIGH (ref 38–126)
Bilirubin, Direct: 0.4 mg/dL — ABNORMAL HIGH (ref 0.0–0.2)
Indirect Bilirubin: 0.7 mg/dL (ref 0.3–0.9)
Total Bilirubin: 1.1 mg/dL (ref 0.3–1.2)
Total Protein: 6.1 g/dL — ABNORMAL LOW (ref 6.5–8.1)

## 2019-07-09 LAB — BASIC METABOLIC PANEL
Anion gap: 14 (ref 5–15)
Anion gap: 13 (ref 5–15)
Anion gap: 13 (ref 5–15)
BUN: 17 mg/dL (ref 6–20)
BUN: 20 mg/dL (ref 6–20)
BUN: 26 mg/dL — ABNORMAL HIGH (ref 6–20)
CO2: 20 mmol/L — ABNORMAL LOW (ref 22–32)
CO2: 18 mmol/L — ABNORMAL LOW (ref 22–32)
CO2: 20 mmol/L — ABNORMAL LOW (ref 22–32)
Calcium: 8 mg/dL — ABNORMAL LOW (ref 8.9–10.3)
Calcium: 7.7 mg/dL — ABNORMAL LOW (ref 8.9–10.3)
Calcium: 7.6 mg/dL — ABNORMAL LOW (ref 8.9–10.3)
Chloride: 90 mmol/L — ABNORMAL LOW (ref 98–111)
Chloride: 92 mmol/L — ABNORMAL LOW (ref 98–111)
Chloride: 94 mmol/L — ABNORMAL LOW (ref 98–111)
Creatinine, Ser: 1.32 mg/dL — ABNORMAL HIGH (ref 0.61–1.24)
Creatinine, Ser: 1.34 mg/dL — ABNORMAL HIGH (ref 0.61–1.24)
Creatinine, Ser: 1.81 mg/dL — ABNORMAL HIGH (ref 0.61–1.24)
GFR calc Af Amer: >60 mL/min (ref >60)
GFR calc Af Amer: >60 mL/min (ref >60)
GFR calc Af Amer: 48 mL/min — ABNORMAL LOW (ref >60)
GFR calc non Af Amer: >60 mL/min (ref >60)
GFR calc non Af Amer: 59 mL/min — ABNORMAL LOW (ref >60)
GFR calc non Af Amer: 41 mL/min — ABNORMAL LOW (ref >60)
Glucose, Bld: 297 mg/dL — ABNORMAL HIGH (ref 70–99)
Glucose, Bld: 277 mg/dL — ABNORMAL HIGH (ref 70–99)
Glucose, Bld: 167 mg/dL — ABNORMAL HIGH (ref 70–99)
Potassium: 4.8 mmol/L (ref 3.5–5.1)
Potassium: 4.7 mmol/L (ref 3.5–5.1)
Potassium: 3.9 mmol/L (ref 3.5–5.1)
Sodium: 124 mmol/L — ABNORMAL LOW (ref 135–145)
Sodium: 123 mmol/L — ABNORMAL LOW (ref 135–145)
Sodium: 127 mmol/L — ABNORMAL LOW (ref 135–145)

## 2019-07-09 LAB — COOXEMETRY PANEL
Carboxyhemoglobin: 0.6 % (ref 0.5–1.5)
Carboxyhemoglobin: 1.1 % (ref 0.5–1.5)
Methemoglobin: 0.7 % (ref 0.0–1.5)
Methemoglobin: 1.2 % (ref 0.0–1.5)
O2 Saturation: 45.2 %
O2 Saturation: 56.3 %
Total hemoglobin: 11 g/dL — ABNORMAL LOW (ref 12.0–16.0)
Total hemoglobin: 10.3 g/dL — ABNORMAL LOW (ref 12.0–16.0)

## 2019-07-09 LAB — POCT I-STAT EG7
Acid-base deficit: 2 mmol/L (ref 0.0–2.0)
Acid-base deficit: 3 mmol/L — ABNORMAL HIGH (ref 0.0–2.0)
Bicarbonate: 23.3 mmol/L (ref 20.0–28.0)
Bicarbonate: 23.5 mmol/L (ref 20.0–28.0)
Calcium, Ion: 1.03 mmol/L — ABNORMAL LOW (ref 1.15–1.40)
Calcium, Ion: 1.03 mmol/L — ABNORMAL LOW (ref 1.15–1.40)
HCT: 35 % — ABNORMAL LOW (ref 39.0–52.0)
HCT: 35 % — ABNORMAL LOW (ref 39.0–52.0)
Hemoglobin: 11.9 g/dL — ABNORMAL LOW (ref 13.0–17.0)
Hemoglobin: 11.9 g/dL — ABNORMAL LOW (ref 13.0–17.0)
O2 Saturation: 58 %
O2 Saturation: 60 %
Potassium: 4.5 mmol/L (ref 3.5–5.1)
Potassium: 4.5 mmol/L (ref 3.5–5.1)
Sodium: 131 mmol/L — ABNORMAL LOW (ref 135–145)
Sodium: 131 mmol/L — ABNORMAL LOW (ref 135–145)
TCO2: 25 mmol/L (ref 22–32)
TCO2: 25 mmol/L (ref 22–32)
pCO2, Ven: 43.7 mmHg — ABNORMAL LOW (ref 44.0–60.0)
pCO2, Ven: 43.7 mmHg — ABNORMAL LOW (ref 44.0–60.0)
pH, Ven: 7.334 (ref 7.250–7.430)
pH, Ven: 7.339 (ref 7.250–7.430)
pO2, Ven: 32 mmHg (ref 32.0–45.0)
pO2, Ven: 33 mmHg (ref 32.0–45.0)

## 2019-07-09 LAB — POCT I-STAT 7, (LYTES, BLD GAS, ICA,H+H)
Acid-base deficit: 5 mmol/L — ABNORMAL HIGH (ref 0.0–2.0)
Acid-base deficit: 2 mmol/L (ref 0.0–2.0)
Bicarbonate: 22 mmol/L (ref 20.0–28.0)
Bicarbonate: 23.1 mmol/L (ref 20.0–28.0)
Calcium, Ion: 1.04 mmol/L — ABNORMAL LOW (ref 1.15–1.40)
Calcium, Ion: 1.05 mmol/L — ABNORMAL LOW (ref 1.15–1.40)
HCT: 33 % — ABNORMAL LOW (ref 39.0–52.0)
HCT: 31 % — ABNORMAL LOW (ref 39.0–52.0)
Hemoglobin: 11.2 g/dL — ABNORMAL LOW (ref 13.0–17.0)
Hemoglobin: 10.5 g/dL — ABNORMAL LOW (ref 13.0–17.0)
O2 Saturation: 99 %
O2 Saturation: 97 %
Patient temperature: 98.3
Patient temperature: 36.7
Potassium: 5.3 mmol/L — ABNORMAL HIGH (ref 3.5–5.1)
Potassium: 4 mmol/L (ref 3.5–5.1)
Sodium: 128 mmol/L — ABNORMAL LOW (ref 135–145)
Sodium: 130 mmol/L — ABNORMAL LOW (ref 135–145)
TCO2: 23 mmol/L (ref 22–32)
TCO2: 24 mmol/L (ref 22–32)
pCO2 arterial: 48.1 mmHg — ABNORMAL HIGH (ref 32.0–48.0)
pCO2 arterial: 37.1 mmHg (ref 32.0–48.0)
pH, Arterial: 7.267 — ABNORMAL LOW (ref 7.350–7.450)
pH, Arterial: 7.401 (ref 7.350–7.450)
pO2, Arterial: 184 mmHg — ABNORMAL HIGH (ref 83.0–108.0)
pO2, Arterial: 90 mmHg (ref 83.0–108.0)

## 2019-07-09 LAB — ECHOCARDIOGRAM LIMITED
Height: 69 in
Weight: 3319.25 oz

## 2019-07-09 LAB — BODY FLUID CELL COUNT WITH DIFFERENTIAL
Eos, Fluid: 1 %
Lymphs, Fluid: 21 %
Monocyte-Macrophage-Serous Fluid: 10 % — ABNORMAL LOW (ref 50–90)
Neutrophil Count, Fluid: 68 % — ABNORMAL HIGH (ref 0–25)
Total Nucleated Cell Count, Fluid: 154 cu mm (ref 0–1000)

## 2019-07-09 LAB — TROPONIN I (HIGH SENSITIVITY)
Troponin I (High Sensitivity): 3337 ng/L (ref <18)
Troponin I (High Sensitivity): 3481 ng/L (ref <18)

## 2019-07-09 LAB — BRAIN NATRIURETIC PEPTIDE: B Natriuretic Peptide: 1815.2 pg/mL — ABNORMAL HIGH (ref 0.0–100.0)

## 2019-07-09 LAB — PROTIME-INR
INR: 5.9 (ref 0.8–1.2)
INR: 3.7 — ABNORMAL HIGH (ref 0.8–1.2)
INR: 5.5 (ref 0.8–1.2)
Prothrombin Time: 51.3 seconds — ABNORMAL HIGH (ref 11.4–15.2)
Prothrombin Time: 35.6 seconds — ABNORMAL HIGH (ref 11.4–15.2)
Prothrombin Time: 48.5 seconds — ABNORMAL HIGH (ref 11.4–15.2)

## 2019-07-09 LAB — LACTATE DEHYDROGENASE: LDH: 1752 U/L — ABNORMAL HIGH (ref 98–192)

## 2019-07-09 LAB — MAGNESIUM: Magnesium: 2.6 mg/dL — ABNORMAL HIGH (ref 1.7–2.4)

## 2019-07-09 LAB — SODIUM: Sodium: 128 mmol/L — ABNORMAL LOW (ref 135–145)

## 2019-07-09 SURGERY — RIGHT HEART CATH
Anesthesia: LOCAL

## 2019-07-09 MED ORDER — ONDANSETRON HCL 4 MG/2ML IJ SOLN: 4.0000 mg | Freq: Four times a day (QID) | INTRAMUSCULAR | Status: DC | PRN

## 2019-07-09 MED ORDER — VANCOMYCIN HCL 1750 MG/350ML IV SOLN
1750.0000 mg | Freq: Once | INTRAVENOUS | Status: AC
  Administered 2019-07-09: 1750 mg via INTRAVENOUS
  Filled 2019-07-09: qty 350

## 2019-07-09 MED ORDER — SODIUM CHLORIDE 0.9 % IV SOLN
250.0000 mL | INTRAVENOUS | Status: DC | PRN
  Administered 2019-07-14: 250 mL via INTRAVENOUS

## 2019-07-09 MED ORDER — FUROSEMIDE 10 MG/ML IJ SOLN
80.0000 mg | Freq: Once | INTRAMUSCULAR | Status: AC
  Administered 2019-07-09: 80 mg via INTRAVENOUS
  Filled 2019-07-09: qty 8

## 2019-07-09 MED ORDER — LABETALOL HCL 5 MG/ML IV SOLN: 10.0000 mg | INTRAVENOUS | Status: AC | PRN

## 2019-07-09 MED ORDER — ACETAMINOPHEN 325 MG PO TABS: 650.0000 mg | ORAL_TABLET | ORAL | Status: DC | PRN

## 2019-07-09 MED ORDER — ORAL CARE MOUTH RINSE
15.0000 mL | OROMUCOSAL | Status: DC
  Administered 2019-07-09 – 2019-07-14 (×46): 15 mL via OROMUCOSAL

## 2019-07-09 MED ORDER — LIDOCAINE HCL (PF) 1 % IJ SOLN
INTRAMUSCULAR | Status: AC
  Filled 2019-07-09: qty 30

## 2019-07-09 MED ORDER — SODIUM CHLORIDE 0.9 % IV SOLN
2.0000 g | Freq: Three times a day (TID) | INTRAVENOUS | Status: AC
  Administered 2019-07-09 – 2019-07-15 (×20): 2 g via INTRAVENOUS
  Filled 2019-07-09 (×20): qty 2

## 2019-07-09 MED ORDER — AMIODARONE HCL IN DEXTROSE 360-4.14 MG/200ML-% IV SOLN
INTRAVENOUS | Status: AC
  Filled 2019-07-09: qty 200

## 2019-07-09 MED ORDER — PHENYLEPHRINE HCL-NACL 10-0.9 MG/250ML-% IV SOLN
INTRAVENOUS | Status: AC
  Filled 2019-07-09: qty 250

## 2019-07-09 MED ORDER — ROCURONIUM BROMIDE 50 MG/5ML IV SOLN
100.0000 mg | Freq: Once | INTRAVENOUS | Status: AC
  Administered 2019-07-09: 100 mg via INTRAVENOUS

## 2019-07-09 MED ORDER — CHLORHEXIDINE GLUCONATE 0.12% ORAL RINSE (MEDLINE KIT)
15.0000 mL | Freq: Two times a day (BID) | OROMUCOSAL | Status: DC
  Administered 2019-07-09 – 2019-07-14 (×10): 15 mL via OROMUCOSAL

## 2019-07-09 MED ORDER — SODIUM CHLORIDE 0.9 % IV SOLN
INTRAVENOUS | Status: DC | PRN
  Administered 2019-07-10 – 2019-07-12 (×3): 10 mL/h via INTRA_ARTERIAL

## 2019-07-09 MED ORDER — INSULIN ASPART 100 UNIT/ML ~~LOC~~ SOLN
0.0000 [IU] | SUBCUTANEOUS | Status: DC
  Administered 2019-07-09 (×2): 3 [IU] via SUBCUTANEOUS
  Administered 2019-07-10 – 2019-07-13 (×6): 2 [IU] via SUBCUTANEOUS
  Administered 2019-07-13: 3 [IU] via SUBCUTANEOUS
  Administered 2019-07-14 – 2019-07-15 (×3): 2 [IU] via SUBCUTANEOUS

## 2019-07-09 MED ORDER — MIDAZOLAM HCL 2 MG/2ML IJ SOLN
INTRAMUSCULAR | Status: AC
  Administered 2019-07-09: 6 mg
  Filled 2019-07-09: qty 6

## 2019-07-09 MED ORDER — HYDRALAZINE HCL 20 MG/ML IJ SOLN: 10.0000 mg | INTRAMUSCULAR | Status: AC | PRN

## 2019-07-09 MED ORDER — HEPARIN (PORCINE) IN NACL 1000-0.9 UT/500ML-% IV SOLN
INTRAVENOUS | Status: AC
  Filled 2019-07-09: qty 1500

## 2019-07-09 MED ORDER — FENTANYL CITRATE (PF) 100 MCG/2ML IJ SOLN: 100.0000 ug | Freq: Once | INTRAMUSCULAR | Status: AC

## 2019-07-09 MED ORDER — IOHEXOL 350 MG/ML SOLN
INTRAVENOUS | Status: DC | PRN
  Administered 2019-07-09: 7 mL

## 2019-07-09 MED ORDER — FENTANYL CITRATE (PF) 100 MCG/2ML IJ SOLN
INTRAMUSCULAR | Status: AC
  Filled 2019-07-09: qty 2

## 2019-07-09 MED ORDER — SODIUM CHLORIDE 0.9% IV SOLUTION: Freq: Once | INTRAVENOUS | Status: AC

## 2019-07-09 MED ORDER — SODIUM BICARBONATE 8.4 % IV SOLN
50.0000 meq | Freq: Once | INTRAVENOUS | Status: AC
  Administered 2019-07-09: 50 meq via INTRAVENOUS

## 2019-07-09 MED ORDER — FENTANYL CITRATE (PF) 100 MCG/2ML IJ SOLN
INTRAMUSCULAR | Status: AC
  Administered 2019-07-09: 100 ug
  Filled 2019-07-09: qty 2

## 2019-07-09 MED ORDER — TOLVAPTAN 15 MG PO TABS
15.0000 mg | ORAL_TABLET | Freq: Once | ORAL | Status: AC
  Administered 2019-07-09: 15 mg via ORAL
  Filled 2019-07-09: qty 1

## 2019-07-09 MED ORDER — FENTANYL 2500MCG IN NS 250ML (10MCG/ML) PREMIX INFUSION
0.0000 ug/h | INTRAVENOUS | Status: DC
  Administered 2019-07-09: 100 ug/h via INTRAVENOUS
  Administered 2019-07-10: 200 ug/h via INTRAVENOUS
  Administered 2019-07-10: 150 ug/h via INTRAVENOUS
  Administered 2019-07-11: 200 ug/h via INTRAVENOUS
  Administered 2019-07-11 – 2019-07-14 (×5): 150 ug/h via INTRAVENOUS
  Filled 2019-07-09 (×9): qty 250

## 2019-07-09 MED ORDER — PHENYLEPHRINE 40 MCG/ML (10ML) SYRINGE FOR IV PUSH (FOR BLOOD PRESSURE SUPPORT)
PREFILLED_SYRINGE | INTRAVENOUS | Status: AC
  Filled 2019-07-09: qty 10

## 2019-07-09 MED ORDER — AMIODARONE IV BOLUS ONLY 150 MG/100ML: 150.0000 mg | Freq: Once | INTRAVENOUS | Status: DC

## 2019-07-09 MED ORDER — VANCOMYCIN HCL 1250 MG/250ML IV SOLN
1250.0000 mg | Freq: Two times a day (BID) | INTRAVENOUS | Status: DC
  Administered 2019-07-09 – 2019-07-12 (×6): 1250 mg via INTRAVENOUS
  Filled 2019-07-09 (×6): qty 250

## 2019-07-09 MED ORDER — HEPARIN (PORCINE) IN NACL 1000-0.9 UT/500ML-% IV SOLN
INTRAVENOUS | Status: DC | PRN
  Administered 2019-07-09 (×2): 500 mL

## 2019-07-09 MED ORDER — SODIUM CHLORIDE 0.9% FLUSH
3.0000 mL | Freq: Two times a day (BID) | INTRAVENOUS | Status: DC
  Administered 2019-07-09 – 2019-07-23 (×15): 3 mL via INTRAVENOUS

## 2019-07-09 MED ORDER — LIDOCAINE HCL (PF) 1 % IJ SOLN
INTRAMUSCULAR | Status: DC | PRN
  Administered 2019-07-09: 2 mL via INTRADERMAL

## 2019-07-09 MED ORDER — SODIUM CHLORIDE 0.9% FLUSH
3.0000 mL | Freq: Two times a day (BID) | INTRAVENOUS | Status: DC
  Administered 2019-07-09 – 2019-07-24 (×14): 3 mL via INTRAVENOUS

## 2019-07-09 MED ORDER — SODIUM CHLORIDE 0.9% FLUSH
3.0000 mL | INTRAVENOUS | Status: DC | PRN
  Administered 2019-07-11: 3 mL via INTRAVENOUS

## 2019-07-09 MED ORDER — TOLVAPTAN 15 MG PO TABS
15.0000 mg | ORAL_TABLET | ORAL | Status: DC
  Filled 2019-07-09: qty 1

## 2019-07-09 MED ORDER — SODIUM CHLORIDE 0.9 % IV SOLN
2.0000 g | Freq: Once | INTRAVENOUS | Status: AC
  Administered 2019-07-09: 2 g via INTRAVENOUS
  Filled 2019-07-09: qty 2

## 2019-07-09 MED ORDER — SODIUM BICARBONATE 8.4 % IV SOLN
INTRAVENOUS | Status: AC
  Filled 2019-07-09: qty 50

## 2019-07-09 SURGICAL SUPPLY — 11 items
BALLN IABP SENSA PLUS 8F 50CC (BALLOONS) ×2
BALLOON IABP SENS PLUS 8F 50CC (BALLOONS) IMPLANT
CATH SWAN GANZ VIP 7.5F (CATHETERS) ×1 IMPLANT
HOVERMATT SINGLE USE (MISCELLANEOUS) ×1 IMPLANT
PACK CARDIAC CATHETERIZATION (CUSTOM PROCEDURE TRAY) ×2 IMPLANT
SHEATH PINNACLE 5F 10CM (SHEATH) ×1 IMPLANT
SHEATH PINNACLE 6F 10CM (SHEATH) ×1 IMPLANT
SHEATH PINNACLE 8F 10CM (SHEATH) ×1 IMPLANT
SHEATH PROBE COVER 6X72 (BAG) ×1 IMPLANT
TRANSDUCER W/STOPCOCK (MISCELLANEOUS) ×2 IMPLANT
WIRE EMERALD 3MM-J .035X150CM (WIRE) ×1 IMPLANT

## 2019-07-09 NOTE — Progress Notes (Signed)
Patient in V Fib at 1609. Dr. Haroldine Laws and Darrick Grinder, NP both called. Orders for sedation given and to prepare patient for cardioversion and both came to bedside. Dr. Carlis Abbott (PCCM) also came to bedside. Patient sedated, intubated, and then cardioverted. Dr. Carlis Abbott performed a bronchoscopy. Later, patient required an additional 6 shocks (to this point). Dr. Haroldine Laws notified of each subsequent change in status and came to bedside each time patient in VF to give orders, performing cardioversions, etc. During this time frame, two additional 150mg  amiodarone boluses given (for a total of three) and two boluses of 50mg  of lidocaine. Dr. Haroldine Laws ordered pacing patient with epicardial pacing wires (already in place) at 80bpm and turned down pump speed on LVAD to 5100.

## 2019-07-09 NOTE — Progress Notes (Signed)
  Labs this am c/w possible septic shock. Cultures drawn. Broad spectrum abx started.   Ramp echo done and RV looks ok. VAD speed increased.  Was given FFP throughout day for possible swan today. Developed increasing resp distress.   CT chest with severe bilateral PNA R>L.  Developed recurrent VF.  Case d/w with Dr. Carlis Abbott in [place]. Decision made to intubate patient.   I called and notified his sister.   Patient intubated and then I cardioverted him with 200J with prompt conversion to NSR.  Will defer swan for now. Continue to treat for sepsis/PNA.   Additional CCT 60 mins. Not including DC-CV.   Glori Bickers, MD  5:03 PM

## 2019-07-09 NOTE — Progress Notes (Signed)
CSW spoke with patient at bedside. CSW offered Patient Outpatient Substance Use Treatment Services resources. Patient accepted.  CSW will continue to follow for any other patient needs.

## 2019-07-09 NOTE — Progress Notes (Addendum)
[place] for Coumadin Indication: LVAD  Allergies  Allergen Reactions  . Codeine Hives    Patient Measurements: Height: 5\' 9"  (175.3 cm) Weight: 94.1 kg (207 lb 7.3 oz) IBW/kg (Calculated) : 70.7 Heparin Dosing Weight: ~ 92 kg  Vital Signs: Temp: 97.8 F (36.6 C) (07/08 0745) Temp Source: Oral (07/08 0745) BP: 103/92 (07/08 0700) Pulse Rate: 103 (07/08 0700)  Labs: Recent Labs    07/07/19 0433 07/07/19 0433 07/08/19 0434 07/08/19 0845 07/09/19 0312  HGB 10.5*   < > 10.4*  --  10.9*  HCT 33.0*  --  32.0*  --  34.1*  PLT 345  --  389  --  413*  LABPROT 28.1*  --  34.8*  --  51.3*  INR 2.7*  --  3.6*  --  5.9*  CREATININE 1.01   < > 1.01 1.03 1.32*   < > = values in this interval not displayed.    Estimated Creatinine Clearance: 71.6 mL/min (A) (by C-G formula based on SCr of 1.32 mg/dL (H)).   Medical History: Past Medical History:  Diagnosis Date  . Acute anterolateral wall MI ([place])   . CAD, multiple vessel   . Hypertension   . Ischemic cardiomyopathy   . Systolic heart failure ([place])   . Tobacco abuse     Medications:  Infusions:  . sodium chloride    . amiodarone 60 mg/hr (07/09/19 0622)  . lactated ringers    . lidocaine 1 mg/min (07/09/19 0400)  . vasopressin      Assessment: 54 yo male s/p LVAD HM3 placement 6/24.  Pharmacy asked to dose  anticoagulation with IV heparin and Coumadin. No overt bleeding or complications noted.    INR today is supratherapeutic at 5.9. Hgb stable 10.9, plt 413. No s/sx of bleeding overnight. LDH up to 1752. LFTs up (AST/ALT 1854/1377). Plan for 2 units FFP. Plan for possible IABP.  Goal of Therapy:  INR 2-2.5  Monitor platelets by anticoagulation protocol: Yes   Plan:  -Hold warfarin tonight -Daily INR, CBC, LDH  Antonietta Jewel, PharmD, [place] Clinical Pharmacist  Phone: [phone] 07/09/2019 8:06 AM  Please check AMION for all [place] phone numbers After 10:00 PM, call  [place] [phone]

## 2019-07-09 NOTE — Progress Notes (Signed)
RT NOTE:  Pt transported to [facility] without event.

## 2019-07-09 NOTE — Progress Notes (Signed)
  Patient developed multiple episodes of recurrent VT/VF with hemodynamic instability.  Several more boluses of amio and lido given. Electrolytes checked. Received several more shocks.  I placed right radial arterial line.    ABG with mixed metabolic and respiratory acidosis. I gave amp bicarb and adjusted the vent.   Patient again returned to VF.   D/w EP. No further suggestions. Will d/w Dr. Prescott Gum about plan to take to cath lab for IABP placement to try to limit VT/VF by reducing ischemic burden.    Additional CCT time > 60 mins. Not including time for arterial line placement.   Glori Bickers, MD  7:23 PM

## 2019-07-09 NOTE — Plan of Care (Signed)
°  Problem: Education: Goal: Understanding of cardiac disease, CV risk reduction, and recovery process will improve Outcome: Progressing Goal: Understanding of medication regimen will improve Outcome: Progressing   Problem: Health Behavior/Discharge Planning: Goal: Ability to safely manage health-related needs after discharge will improve Outcome: Progressing   Problem: Education: Goal: Knowledge of General Education information will improve Description: Including pain rating scale, medication(s)/side effects and non-pharmacologic comfort measures Outcome: Progressing   Problem: Health Behavior/Discharge Planning: Goal: Ability to manage health-related needs will improve Outcome: Progressing   Problem: Clinical Measurements: Goal: Ability to maintain clinical measurements within normal limits will improve Outcome: Progressing   Problem: Elimination: Goal: Will not experience complications related to urinary retention Outcome: Progressing   Problem: Safety: Goal: Ability to remain free from injury will improve Outcome: Progressing   Problem: Skin Integrity: Goal: Risk for impaired skin integrity will decrease Outcome: Progressing   Problem: Education: Goal: Knowledge of the prescribed therapeutic regimen will improve Outcome: Progressing   Problem: Activity: Goal: Risk for activity intolerance will decrease Outcome: Progressing   Problem: Fluid Volume: Goal: Risk for excess fluid volume will decrease Outcome: Progressing   Problem: Respiratory: Goal: Will regain and/or maintain adequate ventilation Outcome: Progressing   Problem: Activity: Goal: Ability to tolerate increased activity will improve Outcome: Not Progressing   Problem: Cardiac: Goal: Ability to achieve and maintain adequate cardiopulmonary perfusion will improve Outcome: Not Progressing   Problem: Clinical Measurements: Goal: Will remain free from infection Outcome: Not Progressing Goal:  Diagnostic test results will improve Outcome: Not Progressing Goal: Respiratory complications will improve Outcome: Not Progressing Goal: Cardiovascular complication will be avoided Outcome: Not Progressing   Problem: Activity: Goal: Risk for activity intolerance will decrease Outcome: Not Progressing   Problem: Nutrition: Goal: Adequate nutrition will be maintained Outcome: Not Progressing   Problem: Coping: Goal: Level of anxiety will decrease Outcome: Not Progressing   Problem: Elimination: Goal: Will not experience complications related to bowel motility Outcome: Not Progressing   Problem: Pain Managment: Goal: General experience of comfort will improve Outcome: Not Progressing   Problem: Cardiac: Goal: Ability to maintain an adequate cardiac output will improve Outcome: Not Progressing   Problem: Coping: Goal: Level of anxiety will decrease Outcome: Not Progressing   Problem: Clinical Measurements: Goal: Ability to maintain clinical measurements within normal limits will improve Outcome: Not Progressing Goal: Will remain free from infection Outcome: Not Progressing

## 2019-07-09 NOTE — Progress Notes (Signed)
CRITICAL VALUE STICKER  CRITICAL VALUE: Troponin 3337 initial, 3481 2hr repeat. Elevated LFTs.  Troponin I (High Sensitivity)     Status: Abnormal   Collection Time: 07/09/19  7:40 AM  Result Value Ref Range   Troponin I (High Sensitivity) 3,337 (HH) <18 ng/L  Hepatic function panel     Status: Abnormal   Collection Time: 07/09/19  7:40 AM  Result Value Ref Range   Total Protein 6.1 (L) 6.5 - 8.1 g/dL   Albumin 2.5 (L) 3.5 - 5.0 g/dL   AST 1,854 (H) 15 - 41 U/L   ALT 1,377 (H) 0 - 44 U/L   Alkaline Phosphatase 146 (H) 38 - 126 U/L   Total Bilirubin 1.1 0.3 - 1.2 mg/dL   Bilirubin, Direct 0.4 (H) 0.0 - 0.2 mg/dL   Indirect Bilirubin 0.7 0.3 - 0.9 mg/dL  BASELINE basic metabolic panel - IF NOT already drawn     Status: Abnormal   Collection Time: 07/09/19  7:40 AM  Result Value Ref Range   Sodium 123 (L) 135 - 145 mmol/L   Potassium 4.7 3.5 - 5.1 mmol/L   Chloride 92 (L) 98 - 111 mmol/L   CO2 18 (L) 22 - 32 mmol/L   Glucose, Bld 277 (H) 70 - 99 mg/dL   BUN 20 6 - 20 mg/dL   Creatinine, Ser 1.34 (H) 0.61 - 1.24 mg/dL   Calcium 7.7 (L) 8.9 - 10.3 mg/dL   GFR calc non Af Amer 59 (L) >60 mL/min   GFR calc Af Amer >60 >60 mL/min   Anion gap 13 5 - 15  Troponin I (High Sensitivity)     Status: Abnormal   Collection Time: 07/09/19  9:20 AM  Result Value Ref Range   Troponin I (High Sensitivity) 3,481 (HH) <18 ng/L    MD NOTIFIED: Pierre Bali MD   TIME OF NOTIFICATION: 07/09/19 1055   RESPONSE:  No new orders. Order for RUQ ultrasound in place. Plan for SWAN and/or IABP later today

## 2019-07-09 NOTE — CV Procedure (Signed)
    DIRECT CURRENT CARDIOVERSION  NAME:  Aaron [name]   MRN: [id number] DOB:  [birth year]   ADMIT DATE: 06/17/2019   INDICATIONS: Ventricular fibrillation    PROCEDURE:   Patient sedated and intubated by CCM. Once an appropriate level of sedation was achieved, the patient received a single biphasic, synchronized 200J shock with prompt conversion to sinus rhythm. No apparent complications.  Glori Bickers, MD  5:03 PM

## 2019-07-09 NOTE — CV Procedure (Signed)
Radial arterial line placement.    Emergent consent assumed. The right wrist was prepped and draped in the routine sterile fashion a single lumen radial arterial catheter was placed in the right radial artery using a modified Seldinger technique and u/s guidance. Good blood flow and wave forms. A dressing was placed.     Glori Bickers, MD  7:23 PM

## 2019-07-09 NOTE — Progress Notes (Addendum)
Patient ID: Aaron [name], male   DOB: August 29, 1965, 54 y.o.   MRN: [id number]     Advanced Heart Failure Rounding Note  PCP-Cardiologist: No primary care provider on file.   Subjective:    - HM3 placement 6/24  - Had 2 episodes of VF requiring emergent DC-CV on 07/02/19 - Recurrent VF on 7/2 and broke spontaneously.  - 7/3 Recurrent VF at 2a.-> emergent DC-CV.  - 07/07/2019 VT/Torsades -> repeat DC_CV - 07/08/2019 VF/Torsades x2--> DC-CV x2.   Remains on lidocaine 1 mg + amio drip 60 mg per hour.   Complaining of chest pain and shortness of breath.    LVAD Interrogation HM III: Speed: 5000 Flow: 3.44   PI: 6.1 Power: 3   Objective:   Weight Range: 94.1 kg Body mass index is 30.64 kg/m.   Vital Signs:   Temp:  [97.5 F (36.4 C)-98.8 F (37.1 C)] 97.5 F (36.4 C) (07/08 0432) Pulse Rate:  [30-114] 67 (07/08 0432) Resp:  [19-36] 23 (07/08 0630) BP: (72-152)/(59-103) 110/74 (07/08 0630) SpO2:  [78 %-100 %] 95 % (07/08 0432) Weight:  [94.1 kg] 94.1 kg (07/08 0600) Last BM Date: 07/08/19  Weight change: Filed Weights   07/07/19 0500 07/08/19 0514 07/09/19 0600  Weight: 94.8 kg 96.5 kg 94.1 kg    Intake/Output:   Intake/Output Summary (Last 24 hours) at 07/09/2019 0715 Last data filed at 07/09/2019 0400 Gross per 24 hour  Intake 1588.95 ml  Output 1775 ml  Net -186.05 ml      Physical Exam  Physical Exam: GENERAL: Appears anxious.  HEENT: normal  NECK: Supple, JVP  9-10 .  2+ bilaterally, no bruits.  No lymphadenopathy or thyromegaly appreciated.   CARDIAC:  Mechanical heart sounds with LVAD hum present.  LUNGS:  Clear to auscultation bilaterally.  ABDOMEN:  Soft, round, nontender, positive bowel sounds x4.     LVAD exit site: Dressing dry and intact.  No erythema or drainage.  Stabilization device present and accurately applied.  Driveline dressing is being changed daily per sterile technique. EXTREMITIES:  Warm and dry, no cyanosis, clubbing, rash or edema . RUE  PICC NEUROLOGIC:  Alert and oriented x 3.    No aphasia.  No dysarthria.  Affect flat .      Telemetry   SR 70s. No VT since 2100   Labs    CBC Recent Labs    07/08/19 0434 07/09/19 0312  WBC 17.7* 22.9*  HGB 10.4* 10.9*  HCT 32.0* 34.1*  MCV 95.8 97.2  PLT 389 834*   Basic Metabolic Panel Recent Labs    07/08/19 0434 07/08/19 0845 07/08/19 1530 07/09/19 0312  NA   < > 125* 125* 124*  K  --  3.7  --  4.8  CL  --  92*  --  90*  CO2  --  23  --  20*  GLUCOSE  --  147*  --  297*  BUN  --  13  --  17  CREATININE  --  1.03  --  1.32*  CALCIUM  --  8.1*  --  8.0*  MG   < >  --  2.1 2.6*   < > = values in this interval not displayed.   Liver Function Tests No results for input(s): AST, ALT, ALKPHOS, BILITOT, PROT, ALBUMIN in the last 72 hours. No results for input(s): LIPASE, AMYLASE in the last 72 hours. Cardiac Enzymes No results for input(s): CKTOTAL, CKMB, CKMBINDEX, TROPONINI in the last  72 hours.  BNP: BNP (last 3 results) Recent Labs    06/26/19 0445 07/02/19 0531 07/09/19 0313  BNP 1,301.1* 1,646.4* 1,815.2*    ProBNP (last 3 results) No results for input(s): PROBNP in the last 8760 hours.   D-Dimer No results for input(s): DDIMER in the last 72 hours. Hemoglobin A1C No results for input(s): HGBA1C in the last 72 hours. Fasting Lipid Panel No results for input(s): CHOL, HDL, LDLCALC, TRIG, CHOLHDL, LDLDIRECT in the last 72 hours. Thyroid Function Tests No results for input(s): TSH, T4TOTAL, T3FREE, THYROIDAB in the last 72 hours.  Invalid input(s): FREET3  Other results:   Imaging    DG CHEST PORT 1 VIEW  Result Date: 07/08/2019 CLINICAL DATA:  54 year old with personal history of CHF and an indwelling LEFT ventricular assist device. EXAM: PORTABLE CHEST 1 VIEW COMPARISON:  07/02/2019 and earlier, including CTA chest 06/17/2019. FINDINGS: Cardiac silhouette massively enlarged, unchanged, with a LEFT ventricular assist device. RIGHT arm  PICC tip projects over the LOWER SVC, unchanged. Since the 07/02/2019 examination, development of perihilar airspace opacities in both lungs, RIGHT greater than LEFT. No visible pleural effusions. IMPRESSION: 1. Stable marked cardiomegaly with LEFT ventricular assist device in place. 2. New bilateral perihilar airspace opacities, RIGHT greater than LEFT, likely pulmonary edema. Electronically Signed   By: Evangeline Dakin M.D.   On: 07/08/2019 09:02     Medications:     Scheduled Medications: . aspirin EC  81 mg Oral Daily  . atorvastatin  80 mg Oral Daily  . bisacodyl  10 mg Oral Daily   Or  . bisacodyl  10 mg Rectal Daily  . Chlorhexidine Gluconate Cloth  6 each Topical Daily  . clonazePAM  0.5 mg Oral BID  . colchicine  0.6 mg Oral BID  . digoxin  0.125 mg Oral Daily  . docusate sodium  200 mg Oral Daily  . doxycycline  100 mg Oral Q12H  . feeding supplement (ENSURE ENLIVE)  237 mL Oral TID BM  . folic acid  1 mg Oral Daily  . gabapentin  300 mg Oral TID  . insulin aspart  0-15 Units Subcutaneous TID WC  . mouth rinse  15 mL Mouth Rinse BID  . midodrine  5 mg Oral TID WC  . multivitamin with minerals  1 tablet Oral Daily  . pantoprazole  40 mg Oral Daily  . ranolazine  500 mg Oral BID  . sodium chloride flush  10-40 mL Intracatheter Q12H  . thiamine  100 mg Oral Daily  . Warfarin - Pharmacist Dosing Inpatient   Does not apply q1600    Infusions: . sodium chloride    . amiodarone 60 mg/hr (07/09/19 0622)  . lactated ringers    . lidocaine 1 mg/min (07/09/19 0400)  . vasopressin      PRN Medications: acetaminophen, ALPRAZolam, bisacodyl, calcium carbonate, dextrose, fentaNYL (SUBLIMAZE) injection, ondansetron (ZOFRAN) IV, oxyCODONE, pneumococcal 23 valent vaccine, sodium chloride flush, sodium chloride flush, traMADol, traZODone    Patient Profile   Aaron [name] --post MI cardiogenic shock/ optimization prior to anticipated CABG, at the request of Dr. Marlou Porch,  Cardiology.    Assessment/Plan   1. Sustained VT/VF  - developed VF on 7/1 x 2 requiring emergent DC-CV - On 7/2 had self-terminating episode - On 7/3 persistent VF/torsades -> DC-CV -On 7/6  VF/torsades.  - On 7/7 VF /Torsades on 2 separate occasions-> DC-CV x2.  - Lidocaine restarted. Continue lidocaine drip.  -Continue amio drip 60 mg per  hour.  - Discussed with EP at bedside.  - keep K> 4.0 Mg >2.0 .  - VAD speed reduced to 5000 - Keep off inotropes  2. Acute Anterolateral STEMI/ Multivessel CAD - Emergent cath showed severe multivessel disease w/ 75% mid- distal LM, 100% prox LAD, not amendable to PCI (unable to cross w/ guide wire, probable CTO), 100% prox LCx (probable CTO) and 100% ostial RCA (probable CTO). There were mild to moderate collaterals right to left to the LAD, minimal right to right collaterals an minimal left to left collaterals. LVEF 25% - Hs trop peaked 27,000. - No targets for CABG / PCI.   - S/P HMIII VAD - suspect VF is ischemically-mediated - Check HS Trop now.   3.  Acute Systolic Heart Failure --->Cardiogenic Shock - 2/2 acute MI. LVEF by LVG 25% - IABP out 6/18 but replaced 6/23. Removed in OR on 6/24  - HMIII LVAD  Placed 6/24 - Extubated 6/25  - Was on milrinone for RV failure. Now off due to VF.  - Co-ox 45%.  - Continue digoxin.  - INR 5.9 . Hold coumadin today.  - LDH 490-->1752. Check HS Trop now and LFTs.  - MAPs low off spiro and sildenafil. Continue  Midodrine - Continue gabapentin 300 mg tid for pocket pain + back-up fentanyl - Ramp ECHO today.  - VAD interrogated personally. Parameters stable.  4. Tobacco Abuse - 30 pack years  - smoking cessation advised  - Will need to quit if to be considered for heart transplant in the future   5. Renal mass = RCC - MRI abdomen- 1.8 cm right renal lesion, most consistent with renal cell carcinoma. No evidence of renal vein involvement or nodal metastasis. - Discussed w/ Urology and R RCC  is small and will just require surveillance.  Ok to proceed with VAD from their standpoint.   8. Hyponatremia  -Given tolvaptan 7/7 - Sodium remains 124. Give dose tolvaptan now.   -Restrict free water.   9. Sleep Apnea - frequent apnea at night  - needs formal sleep study   - CPAP ordered while inpatient  10. Hypokalemia Stable today   11. ID Afebrile.  WBC continues to rise--> 22.9   - Check blood cultures x2 - CXR now.    - Check Ramp ECHO now.    Amy Clegg NP-C  07/09/2019 7:15 AM   Agree.  Had VT x 2 last night requiring shock. This am he is in shock with elevated LFTs, LDH and WBC. INR 5.2. Chest sore. Trop coming down. Rhythm stable on amio and lido.   General:  Ill appearing. No resp difficulty HEENT: normal  Neck: supple. JVP  10 Carotids 2+ bilat; no bruits. No lymphadenopathy or thryomegaly appreciated. Cor: LVAD hum.  Lungs: Coarse Abdomen:  soft, nontender, non-distended. No hepatosplenomegaly. No bruits or masses. Good bowel sounds. Driveline site clean. Anchor in place.  Extremities: no cyanosis, clubbing, rash. Warm no edema  Neuro: alert & oriented x 3. No focal deficits. Moves all 4 without problem   Echo done at bedside shows only mild RV dysfunction. Cannula well placed. Speed turned to 5200.  Suspect shock may be combination of sepsis and cardiogenic. Continue amio and lido.   Give FFP. Bcx. Start abx. Swan later today.  D/w Dr. Prescott Gum at bedside.   CRITICAL CARE Performed by: Glori Bickers  Total critical care time: 45 minutes  Critical care time was exclusive of separately billable procedures and treating other  patients.  Critical care was necessary to treat or prevent imminent or life-threatening deterioration.  Critical care was time spent personally by me (independent of midlevel providers or residents) on the following activities: development of treatment plan with patient and/or surrogate as well as nursing, discussions with  consultants, evaluation of patient's response to treatment, examination of patient, obtaining history from patient or surrogate, ordering and performing treatments and interventions, ordering and review of laboratory studies, ordering and review of radiographic studies, pulse oximetry and re-evaluation of patient's condition.  Glori Bickers, MD  9:56 AM

## 2019-07-09 NOTE — Progress Notes (Signed)
   Called and updated his sister [name].   Plan for swan this afternoon.   Hazell Siwik NP-C  12:09 PM

## 2019-07-09 NOTE — Progress Notes (Addendum)
Electrophysiology Rounding Note  Patient Name: Aaron [name] Date of Encounter: 07/09/2019  Primary Cardiologist: No primary care provider on file. Electrophysiologist: Dr. Rayann [name]  Subjective   Pt had recurrent VT/VF last night requiring multiple shocks with hemodynamic compromise.  No further since 2103 back on lidocaine.   Tired this am. Chest still sore and remains SOB.   Inpatient Medications    Scheduled Meds: . aspirin EC  81 mg Oral Daily  . atorvastatin  80 mg Oral Daily  . bisacodyl  10 mg Oral Daily   Or  . bisacodyl  10 mg Rectal Daily  . Chlorhexidine Gluconate Cloth  6 each Topical Daily  . clonazePAM  0.5 mg Oral BID  . colchicine  0.6 mg Oral BID  . digoxin  0.125 mg Oral Daily  . docusate sodium  200 mg Oral Daily  . doxycycline  100 mg Oral Q12H  . feeding supplement (ENSURE ENLIVE)  237 mL Oral TID BM  . folic acid  1 mg Oral Daily  . gabapentin  300 mg Oral TID  . insulin aspart  0-15 Units Subcutaneous TID WC  . mouth rinse  15 mL Mouth Rinse BID  . midodrine  5 mg Oral TID WC  . multivitamin with minerals  1 tablet Oral Daily  . pantoprazole  40 mg Oral Daily  . ranolazine  500 mg Oral BID  . sodium chloride flush  10-40 mL Intracatheter Q12H  . thiamine  100 mg Oral Daily  . Warfarin - Pharmacist Dosing Inpatient   Does not apply q1600   Continuous Infusions: . sodium chloride    . amiodarone 60 mg/hr (07/09/19 0800)  . ceFEPime (MAXIPIME) IV    . ceFEPime (MAXIPIME) IV 2 g (07/09/19 0931)  . lactated ringers    . lidocaine 1 mg/min (07/09/19 0800)  . vancomycin    . vancomycin 1,750 mg (07/09/19 0851)  . vasopressin     PRN Meds: acetaminophen, ALPRAZolam, bisacodyl, calcium carbonate, dextrose, fentaNYL (SUBLIMAZE) injection, ondansetron (ZOFRAN) IV, oxyCODONE, pneumococcal 23 valent vaccine, sodium chloride flush, traMADol, traZODone   Vital Signs    Vitals:   07/09/19 0630 07/09/19 0700 07/09/19 0745 07/09/19 0917  BP: 110/74  (!) 103/92    Pulse:  (!) 103  68  Resp: (!) 23 (!) 29  (!) 28  Temp:   97.8 F (36.6 C) 98 F (36.7 C)  TempSrc:   Oral Oral  SpO2:  (!) 88%  92%  Weight:      Height:        Intake/Output Summary (Last 24 hours) at 07/09/2019 0932 Last data filed at 07/09/2019 0800 Gross per 24 hour  Intake 1782.29 ml  Output 1775 ml  Net 7.29 ml   Filed Weights   07/07/19 0500 07/08/19 0514 07/09/19 0600  Weight: 94.8 kg 96.5 kg 94.1 kg    Physical Exam    GEN- The patient is fatigued appearing, alert and oriented x 3 today.   Head- normocephalic, atraumatic Eyes-  Sclera clear, conjunctiva pink Ears- hearing intact Oropharynx- clear Neck- supple Lungs- Clear to ausculation bilaterally, normal work of breathing Heart- LVAD hum.  GI- soft, NT, ND, + BS Extremities- no clubbing, cyanosis, or edema Skin- no rash or lesion Psych- flat affect Neuro- strength and sensation are intact  Labs    CBC Recent Labs    07/08/19 0434 07/09/19 0312  WBC 17.7* 22.9*  HGB 10.4* 10.9*  HCT 32.0* 34.1*  MCV 95.8 97.2  PLT 389 578*   Basic Metabolic Panel Recent Labs    07/08/19 0434 07/08/19 0845 07/08/19 1530 07/09/19 0312  NA   < > 125* 125* 124*  K  --  3.7  --  4.8  CL  --  92*  --  90*  CO2  --  23  --  20*  GLUCOSE  --  147*  --  297*  BUN  --  13  --  17  CREATININE  --  1.03  --  1.32*  CALCIUM  --  8.1*  --  8.0*  MG   < >  --  2.1 2.6*   < > = values in this interval not displayed.   Liver Function Tests Recent Labs    07/09/19 0740  AST 1,854*  ALT 1,377*  ALKPHOS 146*  BILITOT 1.1  PROT 6.1*  ALBUMIN 2.5*   No results for input(s): LIPASE, AMYLASE in the last 72 hours. Cardiac Enzymes No results for input(s): CKTOTAL, CKMB, CKMBINDEX, TROPONINI in the last 72 hours.   Telemetry    NSR 60-70s, but with multiple episodes of VT/VF with shocks yesterday evening VT from 1732 - 1753 with shock VT from 1956 - 2045 with shock Short VT @ 2103 with shock  (personally reviewed)  Radiology    DG CHEST PORT 1 VIEW  Result Date: 07/09/2019 CLINICAL DATA:  dyspnea Pt states he has chest pressure and dizziness EXAM: PORTABLE CHEST 1 VIEW COMPARISON:  Chest radiograph 07/08/2019 FINDINGS: Stable right upper extremity PICC. Left ventricular assist device. Unchanged cardiomediastinal contours with surgical changes status post median sternotomy. There are bilateral right greater than left interstitial opacities which appear mildly increased in the right compared to prior. Consolidation in the lower right lung could reflect atelectasis versus infection. No evidence of pneumothorax. No definite pleural effusion. IMPRESSION: 1. Bilateral right greater than left interstitial opacities which appear mildly increased on the right compared to prior. 2. Consolidation in the lower right lung could reflect atelectasis versus infection. Electronically Signed   By: Audie Pinto M.D.   On: 07/09/2019 08:13   DG CHEST PORT 1 VIEW  Result Date: 07/08/2019 CLINICAL DATA:  54 year old with personal history of CHF and an indwelling LEFT ventricular assist device. EXAM: PORTABLE CHEST 1 VIEW COMPARISON:  07/02/2019 and earlier, including CTA chest 06/17/2019. FINDINGS: Cardiac silhouette massively enlarged, unchanged, with a LEFT ventricular assist device. RIGHT arm PICC tip projects over the LOWER SVC, unchanged. Since the 07/02/2019 examination, development of perihilar airspace opacities in both lungs, RIGHT greater than LEFT. No visible pleural effusions. IMPRESSION: 1. Stable marked cardiomegaly with LEFT ventricular assist device in place. 2. New bilateral perihilar airspace opacities, RIGHT greater than LEFT, likely pulmonary edema. Electronically Signed   By: Evangeline Dakin M.D.   On: 07/08/2019 09:02    Patient Profile     Aaron [name] a 54 y.o.maleadmitted with acute MI and cardiogenic shock. 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 Now with refractory VT/VF  post VAD.  Assessment & Plan    1.Sustained VT/VF Recurrent overnight in setting of holding lidocaine with elevated levels. Received shock x 3. No further since just after 2100 back on lidocaine @ 1. Continue amiodarone gtt Continue digoxin with low output Continue Ranexa Electrolytes stable.  Would continue lidocaine for 24 more hours and again try to transition to mexiletine tomorrow am.  Initial coox 45% this am.  K 4.8 and Mg 2.6.  With heavy CAD burden would consider  low dose beta blockade as soon as thought he would tolerate  VAD team to perform Ramp Echo to assess optimal VAD speed.   For questions or updates, please contact [place] Please consult [email or web address] for contact info under Cardiology/STEMI.  Signed, Shirley Friar, PA-C  07/09/2019, 9:32 AM    I have seen, examined the patient, and reviewed the above assessment and plan.  Changes to above are made where necessary.  On exam, RRR.  Ill appearing.  He had ventricular fibrillation and VT yesterday.  More quiescent today.   Continue current therapy for another 24 hours.  Prognosis is guarded.  Co Sign: Thompson Grayer, MD 07/09/2019 12:40 PM

## 2019-07-09 NOTE — Progress Notes (Signed)
Called by nursing staff.   Receiving 3rd unit of FFP. CVP now up to 20 . SOB at rest.   Give 80 mg IV lasix now.  VAD speed up to 5200 earlier. Check CO-OX.   Place on Bipap  Auriel Kist NP-C  1:19 PM

## 2019-07-09 NOTE — Procedures (Addendum)
°  Direct current cardioversion  Aaron [name]  [id number]  Dec 28, 1965  Date:07/09/19  Time:6:28 PM   Provider Performing:Desiraye Rolfson F Rosana Hoes   Procedure: Direct current cardioversion   Indication(s): Persistent VT/VF  Procedure Description Notified by bedside RN that patient converted back into VT/torsodes, this was confirmed on assessment. Patient was already adequately sedated on arrival. At this time a bolus of amiodarone was given and  DCCV at 200J was given with no change. At this time Dr. Haroldine Laws arrived to room and took over care   [name], NP-C [place] / Pager information can be found on Amion  07/09/2019, 6:33 PM

## 2019-07-09 NOTE — Progress Notes (Signed)
   07/09/19 1932  Clinical Encounter Type  Visited With Health care provider  Visit Type Critical Care  Referral From Nurse  Consult/Referral To Chaplain   Chaplain received page for balloon pump activation. Chaplain reported to pt room. No family present at this time. Pt sedated. Chaplains remain available for support as needs arise.   Chaplain Resident, Evelene Croon, M Div. [phone] on-call pager

## 2019-07-09 NOTE — Progress Notes (Signed)
LVAD Coordinator Rounding Note:  Transferred from [facility] ED on 06/17/19 with STEMI. Cath revealed multi-vessel CAD, wire would not cross to open LAD; IABP placed and pt transferred here for evaluation for CABG.   Dr. Orvan Seen evaluated and found he was not amenable to surgical or percutaneous revascularization due to poor targets. With severe depressed LV function and little coronary perfusion, LVAD was recommended.   HM III LVAD implanted on 06/25/19 by Dr. Orvan Seen under Destination Therapy criteria due to recent smoking history.  Had VF again yesterday evening around 1800 and again around 2100. Will accompany pt to cath lab this afternoon for swan. Pt cultured, RAMP completed at bedside this morning speed increased to 5200. RV looks ok. Troponin elevated, LFTs elevated.  Pt c/o of SOB and chest discomfort.  Vital signs: Temp: 98.8 HR: 66 Doppler Pressure: 84 BP: 98/72 (82) O2 Sat: 92% on 2L/[place] Wt: 207>225.7>222.6>214.3>213.4>203.9>205.3>209>212.7>207.4 lbs  LVAD parameters: Speed:  5200 Flow: 4.0 Power:  3.7w PI: 4.5 Hct: 34  Alarms: none Events: none  Fixed speed: 5200 Low speed limit: 4900   Drive Line:  Existing VAD dressing removed and site care performed using sterile technique. Drive line site cleaned with Chlora prep applicator x2, allowed to dry, and gauze dressing with silver strip re-applied.  Exit site with suture remaining, the velour is fully implanted at the exit site. No tissue ingrowth noted. Small amount of redness noted at exit site. Scant fatty necrosis drainage noted on dressing, no tenderness, foul odor or rash noted.  Drive line anchor secure. May advance to every other day dressing changes. Next dressing change due 07/11/19.             Counter Incision: Clean, dry, intact. Mild redness noted at site. Dermabond intact.   Labs:  LDH trend: [phone]  INR trend: 1.4>1.3>1.1>1.2>1.4>1.5>1.7>2.7>3.6>5.9  WBC  trend: 22.9  AST/ALT: 1854/1377  Troponin: 3481   Anticoagulation Plan: -INR Goal:  2.0 - 2.5 -ASA Dose: 81 mg   Blood Products:  -Intra-op - 06/25/19>>6 units FFP   Device: N/A  Drips: Milrinone 0.125 mcg/kg/min>> off Lasix 8 mg/hr-off  Amiodarone 30mg /hr  Lidocaine 1 mg/min   Arrythmias: was on Amiodarone pre implant for NSVT  Respiratory: extubated 06/26/19  Nitric Oxide: off 06/26/19  VAD Education: 1. Sister-Joanne is checked off to perform driveline dressing changes independently.  2. Patient tired and unable to stay awake for teaching today.  3. Pt discharge binder delivered to the room today for pt and sisters review. Pt condition is tenous. Will hold off on formal education.  Plan/Recommendations:  1. Call VAD pager if any VAD equipment or drive line issues. 2. Daily dressing changes per VAD Coordinator, Nurse Davonna Belling, or pts sister Mechele Claude. 3. Will accompany pt to cath lab this afternoon  Tanda Rockers RN [place] Coordinator  Office: [phone]  24/7 Pager: [phone]

## 2019-07-09 NOTE — Procedures (Signed)
Intubation Procedure Note  Aaron [name]  [id number]  April 27, 1965  Date:07/09/19  Time:5:18 PM   Provider Performing:Osher Oettinger P Carlis Abbott    Procedure: Intubation (31500)  Indication(s) Respiratory Failure  Consent Risks of the procedure as well as the alternatives and risks of each were explained to the patient and/or caregiver.  Consent for the procedure was obtained and is signed in the bedside chart   Anesthesia Versed, Fentanyl and Rocuronium   Versed 6mg  Fentanyl 117mcg Rocuronium 100mg    Time Out Verified patient identification, verified procedure, site/side was marked, verified correct patient position, special equipment/implants available, medications/allergies/relevant history reviewed, required imaging and test results available.   Sterile Technique Usual hand hygeine, masks, and gloves were used   Procedure Description Patient positioned in bed supine.  Sedation given as noted above.  Patient was intubated with endotracheal tube using Glidescope. S4 View was Grade 1 full glottis .  Number of attempts was 1.  Colorimetric CO2 detector was consistent with tracheal placement.   Complications/Tolerance None; patient tolerated the procedure well. Chest X-ray is ordered to verify placement.   EBL Minimal   Specimen(s) None  Julian Hy, DO 07/09/19 5:19 PM [facility]

## 2019-07-09 NOTE — Procedures (Signed)
Bronchoscopy Procedure Note  Aaron [name]  [id number]  Mar 01, 1965  Date:07/09/19  Time:5:19 PM   Provider Performing:Cambrey Lupi P Carlis Abbott   Procedure(s):  Flexible Bronchoscopy [phone]) and Flexible bronchoscopy with bronchial alveolar lavage (55974)  Indication(s) Acute resp failure with hypoxia R UL & RLL pneumonia  Consent Unable to obtain consent due to emergent nature of procedure.  Anesthesia Per MAR   Time Out Verified patient identification, verified procedure, site/side was marked, verified correct patient position, special equipment/implants available, medications/allergies/relevant history reviewed, required imaging and test results available.   Sterile Technique Usual hand hygiene, masks, gowns, and gloves were used   Procedure Description Bronchoscope advanced through endotracheal tube and into airway.  Airways were examined down to subsegmental level with findings noted below.   Following diagnostic evaluation, BAL(s) performed in 100cc with normal saline and return of 40cc fluid BAL RML & RLL Findings: minimally cloudy fluid, no purulence. No blood.   Complications/Tolerance None; patient tolerated the procedure well. Chest X-ray is needed post procedure.   EBL Minimal   Specimen(s) Cell count with diff, resp culture  Julian Hy, DO 07/09/19 5:21 PM [facility]

## 2019-07-09 NOTE — Progress Notes (Signed)
Pharmacy Antibiotic Note  Aaron [name] is a 54 y.o. male admitted on 06/17/2019 with sepsis.  Pharmacy has been consulted for vancomycin and cefepime dosing.  Overnight, patient had elevated white blood cells and was afebrile.  Patient required multiple shocks overnight.   Plan: Load with 1750 mg x1, then Vancomycin 1250mg  IV every 12 hours.  Goal trough 10-15 mcg/mL.  Cefepime 2g every 8 hours  Monitor culture results, renal function, and vancomycin levels as indicated  Height: 5\' 9"  (175.3 cm) Weight: 94.1 kg (207 lb 7.3 oz) IBW/kg (Calculated) : 70.7  Temp (24hrs), Avg:97.9 F (36.6 C), Min:97.5 F (36.4 C), Max:98.5 F (36.9 C)  Recent Labs  Lab 07/05/19 0250 07/05/19 0250 07/06/19 0337 07/07/19 0433 07/08/19 0434 07/08/19 0845 07/09/19 0312  WBC 12.3*  --  13.5* 15.9* 17.7*  --  22.9*  CREATININE 0.94   < > 0.93 1.01 1.01 1.03 1.32*   < > = values in this interval not displayed.    Estimated Creatinine Clearance: 71.6 mL/min (A) (by C-G formula based on SCr of 1.32 mg/dL (H)).    Allergies  Allergen Reactions  . Codeine Hives    Antimicrobials this admission: Vancomycin 07/09/19 >>  Cefepime 07/09/19 >>   Dose adjustments this admission: N/A  Microbiology results: 07/09/19 BCx x 2: pending  Thank you for allowing pharmacy to be a part of this patient's care.  Dimple Nanas, PharmD PGY-1 Acute Care Pharmacy Resident Office: [phone] 07/09/2019 8:18 AM

## 2019-07-09 NOTE — Interval H&P Note (Signed)
History and Physical Interval Note:  07/09/2019 8:42 PM  Aaron [name]  has presented today for surgery, with the diagnosis of heart failure.  The various methods of treatment have been discussed with the patient and family. After consideration of risks, benefits and other options for treatment, the patient has consented to  Procedure(s): RIGHT HEART CATH (N/A) IABP Insertion (N/A) as a surgical intervention.  The patient's history has been reviewed, patient examined, no change in status, stable for surgery.  I have reviewed the patient's chart and labs.  Questions were answered to the patient's satisfaction.     Karol Skarzynski

## 2019-07-09 NOTE — CV Procedure (Signed)
    DIRECT CURRENT CARDIOVERSION  NAME:  Aaron [name]   MRN: [id number] DOB:  1965/02/02   ADMIT DATE: 06/17/2019   INDICATIONS: Ventricular fibrillation    PROCEDURE:   Patient intubated and sedated on vent.   Developed recurrent VF. Underwent attempted DC-CV by CCM service with no conversion.  I repositioned the anterior pad and gave him a 200J shock with conversion to NSR but then converted to monomorphic VT. Shocked again at [employer]. Lido 50mg  and amio 150mg  boluses given.   Glori Bickers, MD  6:08 PM

## 2019-07-09 NOTE — Progress Notes (Signed)
  Echocardiogram 2D Echocardiogram has been performed.  Michiel Cowboy 07/09/2019, 8:38 AM

## 2019-07-09 NOTE — Progress Notes (Signed)
OT Cancellation Note  Patient Details Name: Aaron [name] MRN: [id number] DOB: Apr 08, 1965   Cancelled Treatment:    Reason Eval/Treat Not Completed: Medical issues which prohibited therapy Pt with v-tach last night requiring cardioversion. Pt also to have surgery later today. Will continue to follow.  Malka So 07/09/2019, 11:25 AM  Nestor Lewandowsky, OTR/L Acute Rehabilitation Services Pager: [phone] Office: [phone]

## 2019-07-09 NOTE — Progress Notes (Signed)
Speed  Flow  PI  Power  LVIDD  AI  Aortic openings  MR  TR  Septum  RV   5000 3.8 4.9 3.4 5.8  5/5  mild    midline Mod hypo   5100  3.9 4.4 3.6 5.7     midline mod   5200 4.0 4.5 3.7      midline mild                                          Doppler MAP: 45   Ramp ECHO performed at bedside.  At completion of ramp study, patients primary and back up controller programmed:  Fixed speed:  5200 Low speed limit: 4900  Tanda Rockers RN, VAD Coordinator 24/7 pager [phone]

## 2019-07-09 NOTE — Consult Note (Signed)
NAME:  Aaron [name], MRN:  [id number], DOB:  1965-01-16, LOS: 74 ADMISSION DATE:  06/17/2019, CONSULTATION DATE: 07/09/2019 REFERRING MD: Dr. Orvan Seen, CHIEF COMPLAINT: Acute hypoxic respiratory distress  Brief History   54 year old male admitted as a code STEMI  History of present illness   Aaron [name] is a 54 year old male with a past medical history significant for systolic congestive heart failure, ischemic cardiomyopathy, tobacco abuse, and hypertension who presented 6/16 with acute of sudden onset chest pain and abdominal discomfort.  Work-up at outside urgent care revealed STEMI with diffuse ST segment depression and 9/10 chest pain. Patient was transported to [facility] emergency department.   On arrival patient was seen hemodynamically stable.  EKG on arrival consistent with ST depression in inferior lateral leads and Q waves.  Lab work on arrival revealed high-sensitivity troponin of 202, BNP 751, K3.9, glucose 168, creatinine 0.95, WBC 12.5, hemoglobin 16.6, and platelets 177 he received a CT angio to rule out dissection which was negative after which he was urgently taken to Cath Lab for further evaluation.  Cardiac catheterization revealed multivessel CAD, attempt was made to open the LAD but wire would not pass.  At that time intra-aortic balloon pump and Swan scan catheter was placed.   Since admission patient was evaluated by Dr. Julien Girt for possible CABG but unfortunately patient was deemed inappropriate for surgery or percutaneous revascularization due to poor targets.  Therefore, patient was considered for LVAD placement due to severely depressed LV function and little coronary perfusion.   On 06/25/2019 patient had HM III LVAD placed by Dr. Julien Girt under destination therapy criteria due to recent smoking history.  Hospitalization has been complicated by intermittent and frequent V. Fib.  Afternoon of 7/8 patient again went into recurrent V. fib and unfortunately developed  respiratory instability during cardiac arrhythmia resulting in PCCM consultation and intubation.  Patient was given amiodarone bolus and started on drip as well as lidocaine drip.  Post intubation patient was cardioverted at 200 J x 1 with prompt conversion to normal sinus rhythm.  Past Medical History  Systolic congestive heart failure Tobacco abuse Hypertension Ischemic cardiomyopathy  Significant Hospital Events   6/16 admitted  6/24 HM III placed   Consults:  Heart failure Neurology Cardiothoracic surgery Electrophysiology PCCM  Procedures:  7/1 bedside cardioversion due to recurrent V. fib x2 occurrences, converted NSR  7/3 bedside cardioversion due to recurrent V. fib/torsades, converted to NSR 7/6 bedside cardioversion due to recurrent V. fib/torsades, converted to NSR 7/7 bedside cardioversion due to recurrent V. fib x2 occurrences, converted NSR 7/8 intubated by PCCM and underwent bedside cardioversion post intubation, converted to NSR  Significant Diagnostic Tests:  CT angio chest 6/16 > no evidence of aortic aneurysm or dissection, concern for 2.4 cm enhancing lesion within the mid lobe of the right kidney, cannot exclude renal cell carcinoma  CT chest/abdomen/pelvis > severe bilateral pneumonia right greater than left  Abdominal ultrasound 7/8 > minimal dependent sludge within the gallbladder, no other acute findings  Micro Data:  MRSA PCR screening 6/17 > negative Blood culture 7/8 Sputum culture 7/8  Antimicrobials:  Cefepime 7/8 Vancomycin 7/8  Interim history/subjective:  Sedated and ventilated  Objective   Blood pressure (!) 104/91, pulse 89, temperature 98.3 F (36.8 C), temperature source Oral, resp. rate (!) 27, height 5\' 9"  (1.753 m), weight 94.1 kg, SpO2 96 %. CVP:  [13 mmHg-34 mmHg] 13 mmHg  Vent Mode: PRVC FiO2 (%):  [40 %-100 %] 100 % Set Rate:  [  15 bmp] 15 bmp Vt Set:  [565 mL] 565 mL PEEP:  [10 cmH20] 10 cmH20   Intake/Output Summary  (Last 24 hours) at 07/09/2019 1717 Last data filed at 07/09/2019 1530 Gross per 24 hour  Intake 2845.09 ml  Output 2975 ml  Net -129.91 ml   Filed Weights   07/07/19 0500 07/08/19 0514 07/09/19 0600  Weight: 94.8 kg 96.5 kg 94.1 kg    Examination: General: Acutely ill appearing middle aged male on mechanical ventilation, in NAD HEENT: ETT, MM pink/moist, PERRL, sclera non-icteric Neuro: sedated on vent  CV: Mechanical heart sounds from LVAD  PULM:  Clear to ascultation bilaterally, tolerating vent well  GI: soft, bowel sounds active in all 4 quadrants, non-tender, non-distended Extremities: warm/dry, no edema  Skin: no rashes or lesions   Resolved Hospital Problem list     Assessment & Plan:  Acute Hypoxic Respiratory Failure  -In the setting of tachyarrhythmia due to recurrent V. tach/V. fib resulting in decompensation History of sleep apnea P: Continue ventilator support with lung protective strategies  Wean PEEP and FiO2 for sats greater than 90%. Head of bed elevated 30 degrees. Plateau pressures less than 30 cm H20.  Follow intermittent chest x-ray and ABG.   SAT/SBT as tolerated, mentation preclude extubation  Ensure adequate pulmonary hygiene  VAP bundle in place  PAD protocol Patient will need formal sleep study once stabilized   Bilateral HCAP concern for evolving septic shock -Patient was seen with leukocytosis and increasing supplemental oxygen requirements on 7/8 CT chest with bilateral infiltrates right greater than left.  Concern for evolving septic shock in the setting of current cardiogenic shock. P: Close monitoring in the CVICU Respiratory support as above Follow panculture Continue IV cefepime and vancomycin MAP goal greater than 65 Trend CVP  Acute anterior lateral STEMI Multivessel CAD -Patient initially presented with acute onset of substernal chest pain with EKG revealing ST depression.  Patient underwent emergent cardiac catheterization on  arrival which revealed severe multivessel disease -Patient was deemed inappropriate for surgery or percutaneous revascularization due to poor targets. Status post placement of HM III LVAD P: Management per heart failure Close monitoring in the CVICU  Sustained/recurrent V. tach/V. Fib -Per chart review it appears patient has had V. tach/torsades on multiple occasions requiring multiple direct current cardioversions at bedside. -Suspect recurrent VT/VF is ischemically mediated P: Primary management per heart failure Electrophysiologist also consulted Remains on lidocaine and amiodarone drip Continuous telemetry All inotropes per heart failure Follow electrolytes including potassium and magnesium  Acute decompensated systolic congestive heart failure Cardiogenic shock -Secondary to acute MI LVEF by LV G 25% P: Management per heart failure Status post LVAD All inotropes due to recurrent V. fib/V. Tach LVAD parameters stable per heart failure BNP 1815  Small right renal mass Incidental finding of 2.5 cm right mid lateral enhancing solid mass incidentally, MRI obtained 6/20 P:  Urology consulted during admission Given acute cardiovascular comorbidities surveillance was preferred Will need to follow-up with urology at discharge  Tobacco abuse -30 pack/year smoking history P: Smoking cessation advised  Elevated LFTs, question shock liver -AST 1854, ALT 1377 on CHEM panel 7/8, this appears to be acute rise.  Concern for component of septic/cardiogenic shock resulting in shock liver.  Patient has had persistent VT/VF resulting in episodes of hypotension -Abdominal ultrasound 7/8 - P: Trend LFTs Avoid hypotension Treatments for cardiogenic and likely septic shock as above Avoid hepatotoxins  Best practice:  Diet: NPO Pain/Anxiety/Delirium protocol (if indicated): PRN fentanyl  VAP protocol (if indicated): In place  DVT prophylaxis: Coumadin  GI prophylaxis: PPI Glucose  control: SSI Mobility: Bedrest  Code Status: PCB, no CPR Family Communication: Per primary  Disposition: ICU   Labs   CBC: Recent Labs  Lab 07/05/19 0250 07/06/19 0337 07/07/19 0433 07/08/19 0434 07/09/19 0312  WBC 12.3* 13.5* 15.9* 17.7* 22.9*  HGB 10.5* 9.6* 10.5* 10.4* 10.9*  HCT 32.9* 30.9* 33.0* 32.0* 34.1*  MCV 101.5* 101.3* 99.7 95.8 97.2  PLT 340 306 345 389 413*    Basic Metabolic Panel: Recent Labs  Lab 07/06/19 0337 07/06/19 0337 07/07/19 0433 07/07/19 0433 07/08/19 0434 07/08/19 0434 07/08/19 0845 07/08/19 1530 07/09/19 0312 07/09/19 0740 07/09/19 1525  NA 126*   < > 126*   < > 124*   < > 125* 125* 124* 123* 128*  K 4.3   < > 4.4  --  4.0  --  3.7  --  4.8 4.7  --   CL 93*   < > 92*  --  90*  --  92*  --  90* 92*  --   CO2 25   < > 23  --  21*  --  23  --  20* 18*  --   GLUCOSE 216*   < > 157*  --  227*  --  147*  --  297* 277*  --   BUN 12   < > 12  --  14  --  13  --  17 20  --   CREATININE 0.93   < > 1.01  --  1.01  --  1.03  --  1.32* 1.34*  --   CALCIUM 8.0*   < > 8.4*  --  8.2*  --  8.1*  --  8.0* 7.7*  --   MG 1.9  --  2.1  --  2.0  --   --  2.1 2.6*  --   --    < > = values in this interval not displayed.   GFR: Estimated Creatinine Clearance: 70.6 mL/min (A) (by C-G formula based on SCr of 1.34 mg/dL (H)). Recent Labs  Lab 07/06/19 0337 07/07/19 0433 07/08/19 0434 07/09/19 0312  WBC 13.5* 15.9* 17.7* 22.9*    Liver Function Tests: Recent Labs  Lab 07/09/19 0740  AST 1,854*  ALT 1,377*  ALKPHOS 146*  BILITOT 1.1  PROT 6.1*  ALBUMIN 2.5*   No results for input(s): LIPASE, AMYLASE in the last 168 hours. No results for input(s): AMMONIA in the last 168 hours.  ABG    Component Value Date/Time   PHART 7.475 (H) 06/27/2019 0437   PCO2ART 34.7 06/27/2019 0437   PO2ART 67 (L) 06/27/2019 0437   HCO3 25.8 06/27/2019 0437   TCO2 28 07/02/2019 0655   ACIDBASEDEF 1.0 06/26/2019 0838   O2SAT 56.3 07/09/2019 1311      Coagulation Profile: Recent Labs  Lab 07/06/19 0337 07/07/19 0433 07/08/19 0434 07/09/19 0312 07/09/19 1522  INR 2.3* 2.7* 3.6* 5.9* 3.7*    Cardiac Enzymes: No results for input(s): CKTOTAL, CKMB, CKMBINDEX, TROPONINI in the last 168 hours.  HbA1C: Hgb A1c MFr Bld  Date/Time Value Ref Range Status  06/22/2019 11:59 AM 5.4 4.8 - 5.6 % Final    Comment:    (NOTE)         Prediabetes: 5.7 - 6.4         Diabetes: >6.4         Glycemic control for adults with diabetes: <  7.0     CBG: Recent Labs  Lab 07/08/19 1521 07/08/19 2129 07/09/19 0656 07/09/19 1106 07/09/19 1505  GLUCAP 113* 169* 238* 117* 117*    Review of Systems:   Unable to gather secondary to sedation and ventilation   Past Medical History  He,  has a past medical history of Acute anterolateral wall MI ([place]), CAD, multiple vessel, Hypertension, Ischemic cardiomyopathy, Systolic heart failure ([place]), and Tobacco abuse.   Surgical History    Past Surgical History:  Procedure Laterality Date  . CARDIOVERSION N/A 07/02/2019   Procedure: CARDIOVERSION;  Surgeon: Jolaine Artist, MD;  Location: [facility] OR;  Service: Cardiovascular;  Laterality: N/A;  . CORONARY/GRAFT ACUTE MI REVASCULARIZATION N/A 06/17/2019   Procedure: Coronary/Graft Acute MI Revascularization;  Surgeon: Yolonda Kida, MD;  Location: [place] CV LAB;  Service: Cardiovascular;  Laterality: N/A;  . IABP INSERTION N/A 06/17/2019   Procedure: IABP Insertion;  Surgeon: Yolonda Kida, MD;  Location: [place] CV LAB;  Service: Cardiovascular;  Laterality: N/A;  . IABP INSERTION N/A 06/24/2019   Procedure: IABP INSERTION;  Surgeon: Jolaine Artist, MD;  Location: [place] CV LAB;  Service: Cardiovascular;  Laterality: N/A;  . INSERTION OF IMPLANTABLE LEFT VENTRICULAR ASSIST DEVICE N/A 06/25/2019   Procedure: INSERTION OF IMPLANTABLE LEFT VENTRICULAR ASSIST DEVICE USING THORATEC HEARTMATE 3;  Surgeon: Wonda Olds, MD;   Location: [place];  Service: Open Heart Surgery;  Laterality: N/A;  . LEFT HEART CATH AND CORONARY ANGIOGRAPHY N/A 06/17/2019   Procedure: LEFT HEART CATH AND CORONARY ANGIOGRAPHY;  Surgeon: Yolonda Kida, MD;  Location: [place] CV LAB;  Service: Cardiovascular;  Laterality: N/A;  . RIGHT HEART CATH N/A 06/17/2019   Procedure: RIGHT HEART CATH;  Surgeon: Yolonda Kida, MD;  Location: [place] CV LAB;  Service: Cardiovascular;  Laterality: N/A;  . RIGHT HEART CATH N/A 06/24/2019   Procedure: RIGHT HEART CATH;  Surgeon: Jolaine Artist, MD;  Location: [place] CV LAB;  Service: Cardiovascular;  Laterality: N/A;  . TEE WITHOUT CARDIOVERSION N/A 06/25/2019   Procedure: TRANSESOPHAGEAL ECHOCARDIOGRAM (TEE);  Surgeon: Wonda Olds, MD;  Location: [place];  Service: Open Heart Surgery;  Laterality: N/A;     Social History   reports that he has been smoking. He has a 45.00 pack-year smoking history. He has never used smokeless tobacco. He reports current alcohol use of about 7.0 - 14.0 standard drinks of alcohol per week. He reports that he does not use drugs.   Family History   His family history includes CAD in his father; Valvular heart disease in his father.   Allergies Allergies  Allergen Reactions  . Codeine Hives     Home Medications  Prior to Admission medications   Medication Sig Start Date End Date Taking? Authorizing Provider  heparin 25000-0.45 UT/250ML-% infusion Inject 1,200 Units/hr into the vein continuous. 06/17/19   Callwood, Loran Senters, MD     Critical care time:    Performed by: [name]  Total critical care time: 50 minutes  Critical care time was exclusive of separately billable procedures and treating other patients.  Critical care was necessary to treat or prevent imminent or life-threatening deterioration.  Critical care was time spent personally by me on the following activities: development of treatment plan with patient and/or  surrogate as well as nursing, discussions with consultants, evaluation of patient's response to treatment, examination of patient, obtaining history from patient or surrogate, ordering and performing treatments and  interventions, ordering and review of laboratory studies, ordering and review of radiographic studies, pulse oximetry and re-evaluation of patient's condition.  [name], NP-C [facility] Contact / Pager information can be found on Amion  07/09/2019, 6:20 PM

## 2019-07-09 NOTE — Progress Notes (Signed)
   Called by nursing staff for recurrent VF. Due to respiratory instability CCM consulted for intubation.   Instructed to give 150 mg amio bolus now. Continued on amio drip at 60 mg per hour & lidocaine.   After sedated/intubated-- had cardioversion 200 joules x1.   Family aware of plan. Dr Haroldine Laws called and discussed.   Brinn Westby NP-C  4:44 PM

## 2019-07-09 NOTE — H&P (View-Only) (Signed)
  Patient developed multiple episodes of recurrent VT/VF with hemodynamic instability.  Several more boluses of amio and lido given. Electrolytes checked. Received several more shocks.  I placed right radial arterial line.    ABG with mixed metabolic and respiratory acidosis. I gave amp bicarb and adjusted the vent.   Patient again returned to VF.   D/w EP. No further suggestions. Will d/w Dr. Prescott Gum about plan to take to cath lab for IABP placement to try to limit VT/VF by reducing ischemic burden.    Additional CCT time > 60 mins. Not including time for arterial line placement.   Glori Bickers, MD  7:23 PM

## 2019-07-10 ENCOUNTER — Inpatient Hospital Stay (HOSPITAL_COMMUNITY): Payer: BC Managed Care – PPO

## 2019-07-10 ENCOUNTER — Encounter (HOSPITAL_COMMUNITY): Payer: Self-pay | Admitting: Internal Medicine

## 2019-07-10 LAB — PROTIME-INR
INR: 4.1 (ref 0.8–1.2)
INR: 4.7 (ref 0.8–1.2)
Prothrombin Time: 38.4 seconds — ABNORMAL HIGH (ref 11.4–15.2)
Prothrombin Time: 43 seconds — ABNORMAL HIGH (ref 11.4–15.2)

## 2019-07-10 LAB — POCT I-STAT 7, (LYTES, BLD GAS, ICA,H+H)
Acid-Base Excess: 3 mmol/L — ABNORMAL HIGH (ref 0.0–2.0)
Acid-Base Excess: 1 mmol/L (ref 0.0–2.0)
Bicarbonate: 27.5 mmol/L (ref 20.0–28.0)
Bicarbonate: 25.2 mmol/L (ref 20.0–28.0)
Calcium, Ion: 1.02 mmol/L — ABNORMAL LOW (ref 1.15–1.40)
Calcium, Ion: 1.09 mmol/L — ABNORMAL LOW (ref 1.15–1.40)
HCT: 29 % — ABNORMAL LOW (ref 39.0–52.0)
HCT: 27 % — ABNORMAL LOW (ref 39.0–52.0)
Hemoglobin: 9.9 g/dL — ABNORMAL LOW (ref 13.0–17.0)
Hemoglobin: 9.2 g/dL — ABNORMAL LOW (ref 13.0–17.0)
O2 Saturation: 99 %
O2 Saturation: 100 %
Patient temperature: 36.3
Patient temperature: 36.3
Potassium: 3.4 mmol/L — ABNORMAL LOW (ref 3.5–5.1)
Potassium: 3.5 mmol/L (ref 3.5–5.1)
Sodium: 132 mmol/L — ABNORMAL LOW (ref 135–145)
Sodium: 133 mmol/L — ABNORMAL LOW (ref 135–145)
TCO2: 29 mmol/L (ref 22–32)
TCO2: 26 mmol/L (ref 22–32)
pCO2 arterial: 37.2 mmHg (ref 32.0–48.0)
pCO2 arterial: 34.2 mmHg (ref 32.0–48.0)
pH, Arterial: 7.473 — ABNORMAL HIGH (ref 7.350–7.450)
pH, Arterial: 7.473 — ABNORMAL HIGH (ref 7.350–7.450)
pO2, Arterial: 112 mmHg — ABNORMAL HIGH (ref 83.0–108.0)
pO2, Arterial: 155 mmHg — ABNORMAL HIGH (ref 83.0–108.0)

## 2019-07-10 LAB — BASIC METABOLIC PANEL
Anion gap: 12 (ref 5–15)
BUN: 27 mg/dL — ABNORMAL HIGH (ref 6–20)
CO2: 25 mmol/L (ref 22–32)
Calcium: 7.8 mg/dL — ABNORMAL LOW (ref 8.9–10.3)
Chloride: 94 mmol/L — ABNORMAL LOW (ref 98–111)
Creatinine, Ser: 1.75 mg/dL — ABNORMAL HIGH (ref 0.61–1.24)
GFR calc Af Amer: 50 mL/min — ABNORMAL LOW (ref >60)
GFR calc non Af Amer: 43 mL/min — ABNORMAL LOW (ref >60)
Glucose, Bld: 148 mg/dL — ABNORMAL HIGH (ref 70–99)
Potassium: 3.3 mmol/L — ABNORMAL LOW (ref 3.5–5.1)
Sodium: 131 mmol/L — ABNORMAL LOW (ref 135–145)

## 2019-07-10 LAB — BPAM FFP
Blood Product Expiration Date: 202107112359
Blood Product Expiration Date: 202107112359
Blood Product Expiration Date: 202107112359
ISSUE DATE / TIME: 202107080906
ISSUE DATE / TIME: 202107081045
ISSUE DATE / TIME: 202107081328
Unit Type and Rh: 6200
Unit Type and Rh: 6200
Unit Type and Rh: 6200

## 2019-07-10 LAB — CBC
HCT: 28.8 % — ABNORMAL LOW (ref 39.0–52.0)
Hemoglobin: 9.3 g/dL — ABNORMAL LOW (ref 13.0–17.0)
MCH: 31.2 pg (ref 26.0–34.0)
MCHC: 32.3 g/dL (ref 30.0–36.0)
MCV: 96.6 fL (ref 80.0–100.0)
Platelets: 309 10*3/uL (ref 150–400)
RBC: 2.98 MIL/uL — ABNORMAL LOW (ref 4.22–5.81)
RDW: 15.1 % (ref 11.5–15.5)
WBC: 19.2 10*3/uL — ABNORMAL HIGH (ref 4.0–10.5)
nRBC: 0 % (ref 0.0–0.2)

## 2019-07-10 LAB — PREPARE FRESH FROZEN PLASMA: Unit division: 0

## 2019-07-10 LAB — COOXEMETRY PANEL
Carboxyhemoglobin: 0.9 % (ref 0.5–1.5)
Carboxyhemoglobin: 0.9 % (ref 0.5–1.5)
Methemoglobin: 0.9 % (ref 0.0–1.5)
Methemoglobin: 1.1 % (ref 0.0–1.5)
O2 Saturation: 55 %
O2 Saturation: 69 %
Total hemoglobin: 8.9 g/dL — ABNORMAL LOW (ref 12.0–16.0)
Total hemoglobin: 9.3 g/dL — ABNORMAL LOW (ref 12.0–16.0)

## 2019-07-10 LAB — SODIUM: Sodium: 131 mmol/L — ABNORMAL LOW (ref 135–145)

## 2019-07-10 LAB — GLUCOSE, CAPILLARY
Glucose-Capillary: 112 mg/dL — ABNORMAL HIGH (ref 70–99)
Glucose-Capillary: 135 mg/dL — ABNORMAL HIGH (ref 70–99)
Glucose-Capillary: 85 mg/dL (ref 70–99)
Glucose-Capillary: 94 mg/dL (ref 70–99)
Glucose-Capillary: 97 mg/dL (ref 70–99)
Glucose-Capillary: 97 mg/dL (ref 70–99)

## 2019-07-10 LAB — MAGNESIUM: Magnesium: 2.4 mg/dL (ref 1.7–2.4)

## 2019-07-10 LAB — HEPATIC FUNCTION PANEL
ALT: 2497 U/L — ABNORMAL HIGH (ref 0–44)
AST: 3004 U/L — ABNORMAL HIGH (ref 15–41)
Albumin: 2.5 g/dL — ABNORMAL LOW (ref 3.5–5.0)
Alkaline Phosphatase: 128 U/L — ABNORMAL HIGH (ref 38–126)
Bilirubin, Direct: 0.6 mg/dL — ABNORMAL HIGH (ref 0.0–0.2)
Indirect Bilirubin: 0.9 mg/dL (ref 0.3–0.9)
Total Bilirubin: 1.5 mg/dL — ABNORMAL HIGH (ref 0.3–1.2)
Total Protein: 6.1 g/dL — ABNORMAL LOW (ref 6.5–8.1)

## 2019-07-10 LAB — PATHOLOGIST SMEAR REVIEW

## 2019-07-10 LAB — PROCALCITONIN: Procalcitonin: 1.97 ng/mL

## 2019-07-10 LAB — LACTATE DEHYDROGENASE: LDH: 2605 U/L — ABNORMAL HIGH (ref 98–192)

## 2019-07-10 LAB — SEDIMENTATION RATE: Sed Rate: 81 mm/hr — ABNORMAL HIGH (ref 0–16)

## 2019-07-10 LAB — CORTISOL-AM, BLOOD: Cortisol - AM: 27.5 ug/dL — ABNORMAL HIGH (ref 6.7–22.6)

## 2019-07-10 LAB — LIDOCAINE LEVEL: Lidocaine Lvl: 2.6 ug/mL (ref 1.5–5.0)

## 2019-07-10 MED ORDER — TRAZODONE HCL 50 MG PO TABS
100.0000 mg | ORAL_TABLET | Freq: Every evening | ORAL | Status: DC | PRN
  Administered 2019-07-11: 100 mg
  Filled 2019-07-10: qty 2

## 2019-07-10 MED ORDER — CLONAZEPAM 0.5 MG PO TABS
0.5000 mg | ORAL_TABLET | Freq: Two times a day (BID) | ORAL | Status: DC
  Administered 2019-07-10 – 2019-07-14 (×9): 0.5 mg
  Filled 2019-07-10 (×9): qty 1

## 2019-07-10 MED ORDER — FUROSEMIDE 10 MG/ML IJ SOLN
80.0000 mg | Freq: Once | INTRAMUSCULAR | Status: AC
  Administered 2019-07-10: 80 mg via INTRAVENOUS
  Filled 2019-07-10: qty 8

## 2019-07-10 MED ORDER — DIGOXIN 125 MCG PO TABS
0.1250 mg | ORAL_TABLET | Freq: Every day | ORAL | Status: DC
  Administered 2019-07-11 – 2019-07-14 (×4): 0.125 mg
  Filled 2019-07-10 (×4): qty 1

## 2019-07-10 MED ORDER — PANTOPRAZOLE SODIUM 40 MG PO PACK
40.0000 mg | PACK | Freq: Every day | ORAL | Status: DC
  Administered 2019-07-11 – 2019-07-14 (×4): 40 mg
  Filled 2019-07-10 (×4): qty 20

## 2019-07-10 MED ORDER — SODIUM CHLORIDE 0.9% IV SOLUTION: Freq: Once | INTRAVENOUS | Status: AC

## 2019-07-10 MED ORDER — POTASSIUM CHLORIDE 20 MEQ/15ML (10%) PO SOLN
20.0000 meq | ORAL | Status: AC
  Administered 2019-07-10 – 2019-07-11 (×3): 20 meq
  Filled 2019-07-10 (×3): qty 15

## 2019-07-10 MED ORDER — PHYTONADIONE 5 MG PO TABS
2.5000 mg | ORAL_TABLET | Freq: Once | ORAL | Status: AC
  Administered 2019-07-10: 2.5 mg
  Filled 2019-07-10: qty 1

## 2019-07-10 MED ORDER — MIDODRINE HCL 5 MG PO TABS
5.0000 mg | ORAL_TABLET | Freq: Three times a day (TID) | ORAL | Status: DC
  Administered 2019-07-10 – 2019-07-14 (×14): 5 mg
  Filled 2019-07-10 (×14): qty 1

## 2019-07-10 MED ORDER — POTASSIUM CHLORIDE 20 MEQ/15ML (10%) PO SOLN
40.0000 meq | Freq: Four times a day (QID) | ORAL | Status: AC
  Administered 2019-07-10 (×2): 40 meq via ORAL
  Filled 2019-07-10 (×2): qty 30

## 2019-07-10 MED ORDER — FOLIC ACID 1 MG PO TABS
1.0000 mg | ORAL_TABLET | Freq: Every day | ORAL | Status: DC
  Administered 2019-07-11 – 2019-07-14 (×4): 1 mg
  Filled 2019-07-10 (×4): qty 1

## 2019-07-10 MED ORDER — ASPIRIN 81 MG PO CHEW
81.0000 mg | CHEWABLE_TABLET | Freq: Every day | ORAL | Status: DC
  Administered 2019-07-11 – 2019-07-14 (×4): 81 mg
  Filled 2019-07-10 (×4): qty 1

## 2019-07-10 MED ORDER — THIAMINE HCL 100 MG PO TABS
100.0000 mg | ORAL_TABLET | Freq: Every day | ORAL | Status: DC
  Administered 2019-07-11 – 2019-07-14 (×4): 100 mg
  Filled 2019-07-10 (×4): qty 1

## 2019-07-10 MED ORDER — OXYCODONE HCL 5 MG PO TABS: 5.0000 mg | ORAL_TABLET | ORAL | Status: DC | PRN

## 2019-07-10 MED ORDER — DEXMEDETOMIDINE HCL IN NACL 400 MCG/100ML IV SOLN
0.4000 ug/kg/h | INTRAVENOUS | Status: DC
  Administered 2019-07-10: 0.4 ug/kg/h via INTRAVENOUS
  Administered 2019-07-10: 0.5 ug/kg/h via INTRAVENOUS
  Administered 2019-07-11: 1 ug/kg/h via INTRAVENOUS
  Administered 2019-07-11: 0.3 ug/kg/h via INTRAVENOUS
  Administered 2019-07-11: 0.6 ug/kg/h via INTRAVENOUS
  Administered 2019-07-12: 0.8 ug/kg/h via INTRAVENOUS
  Administered 2019-07-12: 0.2 ug/kg/h via INTRAVENOUS
  Administered 2019-07-12: 1 ug/kg/h via INTRAVENOUS
  Administered 2019-07-13: 0.2 ug/kg/h via INTRAVENOUS
  Administered 2019-07-14: 0.3 ug/kg/h via INTRAVENOUS
  Filled 2019-07-10: qty 100
  Filled 2019-07-10: qty 200
  Filled 2019-07-10 (×3): qty 100
  Filled 2019-07-10: qty 200
  Filled 2019-07-10 (×2): qty 100

## 2019-07-10 MED ORDER — TRAMADOL HCL 50 MG PO TABS: 50.0000 mg | ORAL_TABLET | ORAL | Status: DC | PRN

## 2019-07-10 MED ORDER — ATORVASTATIN CALCIUM 80 MG PO TABS
80.0000 mg | ORAL_TABLET | Freq: Every day | ORAL | Status: DC
  Administered 2019-07-11 – 2019-07-14 (×4): 80 mg
  Filled 2019-07-10 (×4): qty 1

## 2019-07-10 MED ORDER — COLCHICINE 0.6 MG PO TABS
0.6000 mg | ORAL_TABLET | Freq: Two times a day (BID) | ORAL | Status: DC
  Administered 2019-07-10 – 2019-07-14 (×9): 0.6 mg
  Filled 2019-07-10 (×9): qty 1

## 2019-07-10 MED ORDER — GABAPENTIN 600 MG PO TABS
300.0000 mg | ORAL_TABLET | Freq: Three times a day (TID) | ORAL | Status: DC
  Administered 2019-07-10 – 2019-07-14 (×15): 300 mg
  Filled 2019-07-10 (×15): qty 1

## 2019-07-10 MED ORDER — DOCUSATE SODIUM 50 MG/5ML PO LIQD
200.0000 mg | Freq: Every day | ORAL | Status: DC
  Administered 2019-07-11 – 2019-07-14 (×4): 200 mg
  Filled 2019-07-10 (×4): qty 20

## 2019-07-10 MED ORDER — POLYVINYL ALCOHOL 1.4 % OP SOLN
1.0000 [drp] | OPHTHALMIC | Status: DC | PRN
  Filled 2019-07-10: qty 15

## 2019-07-10 MED ORDER — ALPRAZOLAM 0.25 MG PO TABS
0.2500 mg | ORAL_TABLET | Freq: Three times a day (TID) | ORAL | Status: DC | PRN
  Administered 2019-07-12: 0.25 mg
  Filled 2019-07-10 (×2): qty 1

## 2019-07-10 MED ORDER — ADULT MULTIVITAMIN W/MINERALS CH
1.0000 | ORAL_TABLET | Freq: Every day | ORAL | Status: DC
  Administered 2019-07-11 – 2019-07-14 (×4): 1
  Filled 2019-07-10 (×4): qty 1

## 2019-07-10 MED ORDER — ACETAMINOPHEN 325 MG PO TABS: 650.0000 mg | ORAL_TABLET | ORAL | Status: DC | PRN

## 2019-07-10 MED FILL — Heparin Sod (Porcine)-NaCl IV Soln 1000 Unit/500ML-0.9%: INTRAVENOUS | Qty: 500 | Status: AC

## 2019-07-10 NOTE — Progress Notes (Signed)
NAME:  Aaron [name], MRN:  [id number], DOB:  [birth year], LOS: 46 ADMISSION DATE:  06/17/2019, CONSULTATION DATE: 07/09/2019 REFERRING MD: Dr. Orvan Seen, CHIEF COMPLAINT: Acute hypoxic respiratory distress  Brief History   54 year old male admitted as a code STEMI  History of present illness   Aaron [name] is a 54 year old male with a past medical history significant for systolic congestive heart failure, ischemic cardiomyopathy, tobacco abuse, and hypertension who presented 6/16 with acute of sudden onset chest pain and abdominal discomfort.  Work-up at outside urgent care revealed STEMI with diffuse ST segment depression and 9/10 chest pain. Patient was transported to [facility] emergency department.   On arrival patient was seen hemodynamically stable.  EKG on arrival consistent with ST depression in inferior lateral leads and Q waves.  Lab work on arrival revealed high-sensitivity troponin of 202, BNP 751, K3.9, glucose 168, creatinine 0.95, WBC 12.5, hemoglobin 16.6, and platelets 177 he received a CT angio to rule out dissection which was negative after which he was urgently taken to Cath Lab for further evaluation.  Cardiac catheterization revealed multivessel CAD, attempt was made to open the LAD but wire would not pass.  At that time intra-aortic balloon pump and Swan scan catheter was placed.   Since admission patient was evaluated by Dr. Julien Girt for possible CABG but unfortunately patient was deemed inappropriate for surgery or percutaneous revascularization due to poor targets.  Therefore, patient was considered for LVAD placement due to severely depressed LV function and little coronary perfusion.   On 06/25/2019 patient had HM III LVAD placed by Dr. Julien Girt under destination therapy criteria due to recent smoking history.  Hospitalization has been complicated by intermittent and frequent V. Fib.  Afternoon of 7/8 patient again went into recurrent V. fib and unfortunately developed  respiratory instability during cardiac arrhythmia resulting in PCCM consultation and intubation.  Patient was given amiodarone bolus and started on drip as well as lidocaine drip.  Post intubation patient was cardioverted at 200 J x 1 with prompt conversion to normal sinus rhythm.  Past Medical History  Systolic congestive heart failure Tobacco abuse Hypertension Ischemic cardiomyopathy  Significant Hospital Events   6/16 admitted  6/24 HM III placed   Consults:  Heart failure Neurology Cardiothoracic surgery Electrophysiology PCCM  Procedures:  7/1 bedside cardioversion due to recurrent V. fib x2 occurrences, converted NSR  7/3 bedside cardioversion due to recurrent V. fib/torsades, converted to NSR 7/6 bedside cardioversion due to recurrent V. fib/torsades, converted to NSR 7/7 bedside cardioversion due to recurrent V. fib x2 occurrences, converted NSR 7/8 intubated by PCCM and underwent bedside cardioversion post intubation, converted to NSR  Significant Diagnostic Tests:  CT angio chest 6/16 > no evidence of aortic aneurysm or dissection, concern for 2.4 cm enhancing lesion within the mid lobe of the right kidney, cannot exclude renal cell carcinoma  CT chest/abdomen/pelvis > severe bilateral pneumonia right greater than left  Abdominal ultrasound 7/8 > minimal dependent sludge within the gallbladder, no other acute findings  Micro Data:  MRSA PCR screening 6/17 > negative Blood culture 7/8>> resp BAL culture 7/8>>  Antimicrobials:  Cefepime 7/8 Vancomycin 7/8  Interim history/subjective:  Remains intubated and sedated. IABP overnight.  Objective   Blood pressure (!) 136/48, pulse (!) 42, temperature 97.7 F (36.5 C), resp. rate 20, height _0  (1.753 m), weight 94 kg, SpO2 100 %. PAP: (36-48)/(21-31) 37/24 CVP:  [13 mmHg-34 mmHg] 14 mmHg CO:  [4.1 L/min-5.2 L/min] 5.2 L/min CI:  [2 L/min/m2-2.5  L/min/m2] 2.5 L/min/m2  Vent Mode: PRVC FiO2 (%):  [40 %-100 %]  70 % Set Rate:  [15 bmp-20 bmp] 20 bmp Vt Set:  [565 mL-570 mL] 570 mL PEEP:  [10 cmH20] 10 cmH20 Plateau Pressure:  [22 cmH20-28 cmH20] 22 cmH20   Intake/Output Summary (Last 24 hours) at 07/10/2019 0802 Last data filed at 07/10/2019 0700 Gross per 24 hour  Intake 2827.19 ml  Output 2800 ml  Net 27.19 ml   Filed Weights   07/08/19 0514 07/09/19 0600 07/10/19 0500  Weight: 96.5 kg 94.1 kg 94 kg    Examination: General: Ill-appearing man lying in bed intubated, not sedated.  HEENT: /AT, eyes anicteric.  Oral mucosa moist.  ETT and OGT in place Neuro: RASS equals 0, moving all extremities on command.  Comprehension appears intact. CV: Mechanical heart sounds from VAD PULM: Mild rhonchi bilaterally, breathing synchronously with the vent, no accessory muscle use. GI: Soft, distended, hypoactive bowel sounds.  Tympanic to percussion. Extremities: No peripheral edema, no clubbing or cyanosis.  Right radial A-line with good distal perfusion.  Good perfusion distal to right femoral balloon pump Skin: No rashes or wounds   Resolved Hospital Problem list     Assessment & Plan:  Acute hypoxic respiratory failure  -In the setting of tachyarrhythmia due to recurrent V. Tach/  V. fib resulting in decompensation History of sleep apnea RUL & RLL HAP leading to acute decompensation requiring mechanical ventilation P: -Continue low tidal volume ventilation with goal plateau less than 30 driving pressure 19-37.  Meeting goals.  Titrate PEEP and FiO2 to maintain SPO2 greater than 90%. -MAP prevention protocol -Daily SAT and SBT as appropriate.  Oxygen requirements remain too high for SBT. -Patient will need formal sleep study once stabilized   Bilateral HAP concern for evolving septic shock -Patient was seen with leukocytosis and increasing supplemental oxygen requirements on 7/8 CT chest with bilateral infiltrates right greater than left.  Concern for evolving septic shock in the setting of  current cardiogenic shock. P: -Continue to follow respiratory cultures -Continue broad-spectrum empiric antibiotics to cover resistant organisms -Goal MAP greater than 65  Acute anterior lateral STEMI Multivessel CAD Cardiogenic shock requiring LVAD- HM III  Recurrent VT, likely due to acute sepsis.  Status post multiple DCCV.  Improved since IABP and mechanical ventilation. P: -Planning to add iNO -Requires ongoing monitoring in the ICU -Continue vasopressin and midodrine -Continue amiodarone and lidocaine -Optimize electrolytes   Small right renal mass, concerning for RCC Incidental finding of 2.5 cm right mid lateral enhancing solid mass incidentally, MRI obtained 6/20 P:  Given acute cardiovascular comorbidities surveillance was preferred -Will need to follow-up with urology at discharge  Tobacco abuse -30 pack/year smoking history P: -Strongly recommend smoking cessation permanently  Elevated LFTs, question shock liver versus congestive hepatopathy versus less likely medication effect -AST 1854, ALT 1377 on CHEM panel 7/8, this appears to be acute rise.  Concern for component of septic/cardiogenic shock resulting in shock liver.  Patient has had persistent VT/VF resulting in episodes of hypotension -Abdominal ultrasound 7/8 - P: -Repeat LFTs pending -Continue cardiac support devices -Continue amiodarone for now given recurrent tachyarrhythmias.  Best practice:  Diet: NPO Pain/Anxiety/Delirium protocol (if indicated): PRN fentanyl VAP protocol (if indicated): In place  DVT prophylaxis: Coumadin  GI prophylaxis: PPI Glucose control: SSI Mobility: Bedrest  Code Status: PCB, no CPR Family Communication: Per primary  Disposition: ICU   Labs   CBC: Recent Labs  Lab 07/07/19 0433 07/07/19 0433  07/08/19 0434 07/08/19 0434 07/09/19 0312 07/09/19 1915 07/09/19 2059 07/09/19 2238 07/09/19 2244 07/10/19 0423 07/10/19 0434  WBC 15.9*  --  17.7*  --  22.9*  --    --  25.7*  --   --  19.2*  HGB 10.5*   < > 10.4*   < > 10.9*   < > 11.9*  11.9* 9.9* 10.5* 9.9* 9.3*  HCT 33.0*   < > 32.0*   < > 34.1*   < > 35.0*  35.0* 30.7* 31.0* 29.0* 28.8*  MCV 99.7  --  95.8  --  97.2  --   --  95.9  --   --  96.6  PLT 345  --  389  --  413*  --   --  355  --   --  309   < > = values in this interval not displayed.    Basic Metabolic Panel: Recent Labs  Lab 07/07/19 0433 07/07/19 0433 07/08/19 0434 07/08/19 0434 07/08/19 0845 07/08/19 0845 07/08/19 1530 07/08/19 1530 07/09/19 0312 07/09/19 0312 07/09/19 0740 07/09/19 1525 07/09/19 2059 07/09/19 2059 07/09/19 2238 07/09/19 2244 07/10/19 0034 07/10/19 0423 07/10/19 0434  NA 126*   < > 124*   < > 125*   < > 125*   < > 124*   < > 123*   < > 131*  131*   < > 127* 130* 131* 132* 131*  K 4.4   < > 4.0   < > 3.7   < >  --   --  4.8   < > 4.7   < > 4.5  4.5  --  3.9 4.0  --  3.4* 3.3*  CL 92*   < > 90*   < > 92*  --   --   --  90*  --  92*  --   --   --  94*  --   --   --  94*  CO2 23   < > 21*   < > 23  --   --   --  20*  --  18*  --   --   --  20*  --   --   --  25  GLUCOSE 157*   < > 227*   < > 147*  --   --   --  297*  --  277*  --   --   --  167*  --   --   --  148*  BUN 12   < > 14   < > 13  --   --   --  17  --  20  --   --   --  26*  --   --   --  27*  CREATININE 1.01   < > 1.01   < > 1.03  --   --   --  1.32*  --  1.34*  --   --   --  1.81*  --   --   --  1.75*  CALCIUM 8.4*   < > 8.2*   < > 8.1*  --   --   --  8.0*  --  7.7*  --   --   --  7.6*  --   --   --  7.8*  MG 2.1  --  2.0  --   --   --  2.1  --  2.6*  --   --   --   --   --   --   --   --   --  2.4   < > = values in this interval not displayed.   GFR: Estimated Creatinine Clearance: 54 mL/min (A) (by C-G formula based on SCr of 1.75 mg/dL (H)). Recent Labs  Lab 07/08/19 0434 07/09/19 0312 07/09/19 2238 07/10/19 0434  WBC 17.7* 22.9* 25.7* 19.2*    Liver Function Tests: Recent Labs  Lab 07/09/19 0740  AST 1,854*  ALT  1,377*  ALKPHOS 146*  BILITOT 1.1  PROT 6.1*  ALBUMIN 2.5*   No results for input(s): LIPASE, AMYLASE in the last 168 hours. No results for input(s): AMMONIA in the last 168 hours.  ABG    Component Value Date/Time   PHART 7.473 (H) 07/10/2019 0423   PCO2ART 37.2 07/10/2019 0423   PO2ART 112 (H) 07/10/2019 0423   HCO3 27.5 07/10/2019 0423   TCO2 29 07/10/2019 0423   ACIDBASEDEF 2.0 07/09/2019 2244   O2SAT 99.0 07/10/2019 0423     Coagulation Profile: Recent Labs  Lab 07/08/19 0434 07/09/19 0312 07/09/19 1522 07/09/19 2238 07/10/19 0434  INR 3.6* 5.9* 3.7* 5.5* 4.1*    Cardiac Enzymes: No results for input(s): CKTOTAL, CKMB, CKMBINDEX, TROPONINI in the last 168 hours.  HbA1C: Hgb A1c MFr Bld  Date/Time Value Ref Range Status  06/22/2019 11:59 AM 5.4 4.8 - 5.6 % Final    Comment:    (NOTE)         Prediabetes: 5.7 - 6.4         Diabetes: >6.4         Glycemic control for adults with diabetes: <7.0     CBG: Recent Labs  Lab 07/09/19 1106 07/09/19 1505 07/09/19 1932 07/09/19 2323 07/10/19 0356  GLUCAP 117* 117* 158* 154* 135*    This patient is critically ill with multiple organ system failure which requires frequent high complexity decision making, assessment, support, evaluation, and titration of therapies. This was completed through the application of advanced monitoring technologies and extensive interpretation of multiple databases. During this encounter critical care time was devoted to patient care services described in this note for 40 minutes.  Julian Hy, DO 07/10/19 10:46 AM [facility]

## 2019-07-10 NOTE — Progress Notes (Signed)
Electrophysiology Rounding Note  Patient Name: Aaron [name] Date of Encounter: 07/10/2019  Primary Cardiologist: No primary care provider on file. Electrophysiologist: Dr. Rayann Heman  Subjective   Pt remains critically ill with worsening throughout day yesterday include possible sepsis and multiple recurrent shocks.   Pt now intubated, but awake and follows commands.   IABP also placed for mechanical support. Pacing through precordial temp pacing wires set at 70/DDD.   Inpatient Medications    Scheduled Meds: . [START ON 07/11/2019] aspirin  81 mg Per Tube Daily  . [START ON 07/11/2019] atorvastatin  80 mg Per Tube Daily  . bisacodyl  10 mg Oral Daily   Or  . bisacodyl  10 mg Rectal Daily  . chlorhexidine gluconate (MEDLINE KIT)  15 mL Mouth Rinse BID  . Chlorhexidine Gluconate Cloth  6 each Topical Daily  . clonazePAM  0.5 mg Per Tube BID  . colchicine  0.6 mg Per Tube BID  . [START ON 07/11/2019] digoxin  0.125 mg Per Tube Daily  . [START ON 07/11/2019] docusate  200 mg Per Tube Daily  . [START ON 2/52/7129] folic acid  1 mg Per Tube Daily  . gabapentin  300 mg Per Tube TID  . insulin aspart  0-15 Units Subcutaneous Q4H  . mouth rinse  15 mL Mouth Rinse 10 times per day  . midodrine  5 mg Per Tube TID WC  . [START ON 07/11/2019] multivitamin with minerals  1 tablet Per Tube Daily  . [START ON 07/11/2019] pantoprazole sodium  40 mg Per Tube Daily  . phytonadione  2.5 mg Per Tube Once  . potassium chloride  40 mEq Oral Q6H  . ranolazine  500 mg Oral BID  . sodium chloride flush  10-40 mL Intracatheter Q12H  . sodium chloride flush  3 mL Intravenous Q12H  . sodium chloride flush  3 mL Intravenous Q12H  . [START ON 07/11/2019] thiamine  100 mg Per Tube Daily   Continuous Infusions: . sodium chloride    . sodium chloride    . sodium chloride    . amiodarone 60 mg/hr (07/10/19 0900)  . amiodarone    . ceFEPime (MAXIPIME) IV Stopped (07/10/19 0830)  . fentaNYL infusion  INTRAVENOUS 200 mcg/hr (07/10/19 0900)  . lactated ringers    . lidocaine 1 mg/min (07/10/19 0900)  . vancomycin 1,250 mg (07/10/19 0912)  . vasopressin 0.03 Units/min (07/10/19 0900)   PRN Meds: Place/Maintain arterial line **AND** sodium chloride, sodium chloride, acetaminophen, ALPRAZolam, bisacodyl, calcium carbonate, dextrose, fentaNYL (SUBLIMAZE) injection, ondansetron (ZOFRAN) IV, oxyCODONE, pneumococcal 23 valent vaccine, sodium chloride flush, sodium chloride flush, traMADol, traZODone   Vital Signs    Vitals:   07/10/19 0800 07/10/19 0818 07/10/19 0900 07/10/19 1000  BP:      Pulse: 72  (!) 43 (!) 41  Resp: (!) '21  20 18  ' Temp: (!) 97.5 F (36.4 C)  (!) 97.3 F (36.3 C) (!) 97.5 F (36.4 C)  TempSrc: Core     SpO2: 100% 100% 100% 100%  Weight:      Height:        Intake/Output Summary (Last 24 hours) at 07/10/2019 1006 Last data filed at 07/10/2019 0900 Gross per 24 hour  Intake 2708.03 ml  Output 2425 ml  Net 283.03 ml   Filed Weights   07/08/19 0514 07/09/19 0600 07/10/19 0500  Weight: 96.5 kg 94.1 kg 94 kg    Physical Exam    GEN- The patient is ill appearing.  Intubated. Awake and follows commands.  HEENT- Intubated Lungs- + mechanical breathing sounds.  Heart- LVAD hum GI- soft, NT, ND, + BS Extremities- no clubbing, cyanosis, or edema Skin- no rash or lesion Psych- flat but appropriate affect  Labs    CBC Recent Labs    07/09/19 2238 07/09/19 2244 07/10/19 0423 07/10/19 0434  WBC 25.7*  --   --  19.2*  HGB 9.9*   < > 9.9* 9.3*  HCT 30.7*   < > 29.0* 28.8*  MCV 95.9  --   --  96.6  PLT 355  --   --  309   < > = values in this interval not displayed.   Basic Metabolic Panel Recent Labs    07/09/19 0312 07/09/19 0740 07/09/19 2238 07/09/19 2244 07/10/19 0423 07/10/19 0434  NA 124*   < > 127*   < > 132* 131*  K 4.8   < > 3.9   < > 3.4* 3.3*  CL 90*   < > 94*  --   --  94*  CO2 20*   < > 20*  --   --  25  GLUCOSE 297*   < > 167*  --    --  148*  BUN 17   < > 26*  --   --  27*  CREATININE 1.32*   < > 1.81*  --   --  1.75*  CALCIUM 8.0*   < > 7.6*  --   --  7.8*  MG 2.6*  --   --   --   --  2.4   < > = values in this interval not displayed.   Liver Function Tests Recent Labs    07/09/19 0740  AST 1,854*  ALT 1,377*  ALKPHOS 146*  BILITOT 1.1  PROT 6.1*  ALBUMIN 2.5*   No results for input(s): LIPASE, AMYLASE in the last 72 hours. Cardiac Enzymes No results for input(s): CKTOTAL, CKMB, CKMBINDEX, TROPONINI in the last 72 hours.  Telemetry    A/V pacing in 70s, yesterday evening had multiple episodes of VF requiring upwards of 8 shocks. VT quiescent since returning from IABP (personally reviewed)  Radiology    CT ABDOMEN PELVIS WO CONTRAST  Result Date: 07/09/2019 CLINICAL DATA:  54 year old male with worsening heart failure symptoms. Elevated white blood cell. EXAM: CT CHEST, ABDOMEN AND PELVIS WITHOUT CONTRAST TECHNIQUE: Multidetector CT imaging of the chest, abdomen and pelvis was performed following the standard protocol without IV contrast. COMPARISON:  CT dated 06/17/2019. FINDINGS: CT CHEST FINDINGS Cardiovascular: There is moderate cardiomegaly increased in size since the prior CT. There has been interval placement of a left ventricular assist device. There is somewhat loculated appearing fluid in the anterior mediastinum and surrounding the outflow graft. This is nonspecific and although may represent postsurgical seroma, infection is not excluded. Clinical correlation is recommended. The fluid in the anterior mediastinum measures 8.5 x 3.9 cm in greatest axial dimensions. Tiny foci of air noted within this fluid. No fluid noted along the drive line in the anterior chest wall. There is mild atherosclerotic calcification of the aorta. There is mild dilatation of the main pulmonary trunk suggestive of a degree of pulmonary hypertension. There is advanced 3 vessel coronary vascular calcification. There is a faint 2  cm rounded high attenuation superior to the aortic valve, in the sinotubular aorta of indeterminate etiology. Although this may represent pooling of residual contrast, a blood clot is not excluded. There is a right-sided PICC with  tip in central SVC close to the cavoatrial junction. Mediastinum/Nodes: Mildly enlarged pretracheal lymph node measures 15 mm in short axis. No hilar adenopathy. The esophagus is grossly unremarkable. Lungs/Pleura: There are small bilateral pleural effusions, left greater right. There is partial consolidative changes of the left lower lobe with air bronchograms which may represent atelectasis or pneumonia. There is extensive interstitial and interlobular septal prominence and ground-glass opacity throughout the right lung and to a lesser degree involving the left upper lobe. Findings most likely represent asymmetric edema. ARDS or pneumonia are not excluded. There is no pneumothorax. The central airways are patent. Musculoskeletal: Median sternotomy wires. Degenerative changes of the spine. No acute osseous pathology. CT ABDOMEN PELVIS FINDINGS No intra-abdominal free air or free fluid. Hepatobiliary: Probable mild fatty liver. No intrahepatic biliary dilatation. High attenuating content within the gallbladder likely vicarious excretion of recently administered contrast. Pancreas: Unremarkable. No pancreatic ductal dilatation or surrounding inflammatory changes. Spleen: Normal in size without focal abnormality. Adrenals/Urinary Tract: The adrenal glands are unremarkable. There is a 4 mm nonobstructing right renal upper pole calculus. There is no hydronephrosis on either side. Indeterminate subcentimeter partially exophytic focus from the posterior inferior pole of the right kidney is not characterized. The previously described lesion in the interpolar aspect of the right kidney is not visualized on this noncontrast CT. The visualized ureters and urinary bladder appear unremarkable.  Stomach/Bowel: There is no bowel obstruction or active inflammation. The appendix is normal. Vascular/Lymphatic: Mild aortoiliac atherosclerotic disease. The IVC is unremarkable. No portal venous gas. There is no adenopathy. There is stranding of the retroperitoneal fat and tissue plane surrounding the iliac vasculature, likely related to recent line placement. No fluid collection or large hematoma. Reproductive: The prostate and seminal vesicles are grossly unremarkable. No pelvic mass. Other: There is mild diffuse subcutaneous edema. No fluid collection. Musculoskeletal: Degenerative changes of the spine. No acute osseous pathology. IMPRESSION: 1. Interval placement LVAD with loculated appearing fluid in the anterior mediastinum and surrounding the outflow graft. This is nonspecific and although may represent postsurgical seroma, infection is not excluded. Clinical correlation is recommended. 2. Ill-defined faint high attenuating content above the aortic valve leaflets suboptimally characterized. Blood clot is not excluded. 3. Small bilateral pleural effusions and findings of asymmetric edema. Pneumonia is not excluded. 4. No bowel obstruction. Normal appendix. 5. A 4 mm nonobstructing right renal upper pole calculus. No hydronephrosis. 6. Aortic Atherosclerosis (ICD10-I70.0). These results were called by telephone at the time of interpretation on 07/09/2019 at 5:15 pm to Dr. Orvan Seen nurse, Valentina Gu, who verbally acknowledged these results. Electronically Signed   By: Anner Crete M.D.   On: 07/09/2019 17:49   CT CHEST WO CONTRAST  Result Date: 07/09/2019 CLINICAL DATA:  55 year old male with worsening heart failure symptoms. Elevated white blood cell. EXAM: CT CHEST, ABDOMEN AND PELVIS WITHOUT CONTRAST TECHNIQUE: Multidetector CT imaging of the chest, abdomen and pelvis was performed following the standard protocol without IV contrast. COMPARISON:  CT dated 06/17/2019. FINDINGS: CT CHEST FINDINGS  Cardiovascular: There is moderate cardiomegaly increased in size since the prior CT. There has been interval placement of a left ventricular assist device. There is somewhat loculated appearing fluid in the anterior mediastinum and surrounding the outflow graft. This is nonspecific and although may represent postsurgical seroma, infection is not excluded. Clinical correlation is recommended. The fluid in the anterior mediastinum measures 8.5 x 3.9 cm in greatest axial dimensions. Tiny foci of air noted within this fluid. No fluid noted along the drive  line in the anterior chest wall. There is mild atherosclerotic calcification of the aorta. There is mild dilatation of the main pulmonary trunk suggestive of a degree of pulmonary hypertension. There is advanced 3 vessel coronary vascular calcification. There is a faint 2 cm rounded high attenuation superior to the aortic valve, in the sinotubular aorta of indeterminate etiology. Although this may represent pooling of residual contrast, a blood clot is not excluded. There is a right-sided PICC with tip in central SVC close to the cavoatrial junction. Mediastinum/Nodes: Mildly enlarged pretracheal lymph node measures 15 mm in short axis. No hilar adenopathy. The esophagus is grossly unremarkable. Lungs/Pleura: There are small bilateral pleural effusions, left greater right. There is partial consolidative changes of the left lower lobe with air bronchograms which may represent atelectasis or pneumonia. There is extensive interstitial and interlobular septal prominence and ground-glass opacity throughout the right lung and to a lesser degree involving the left upper lobe. Findings most likely represent asymmetric edema. ARDS or pneumonia are not excluded. There is no pneumothorax. The central airways are patent. Musculoskeletal: Median sternotomy wires. Degenerative changes of the spine. No acute osseous pathology. CT ABDOMEN PELVIS FINDINGS No intra-abdominal free air or  free fluid. Hepatobiliary: Probable mild fatty liver. No intrahepatic biliary dilatation. High attenuating content within the gallbladder likely vicarious excretion of recently administered contrast. Pancreas: Unremarkable. No pancreatic ductal dilatation or surrounding inflammatory changes. Spleen: Normal in size without focal abnormality. Adrenals/Urinary Tract: The adrenal glands are unremarkable. There is a 4 mm nonobstructing right renal upper pole calculus. There is no hydronephrosis on either side. Indeterminate subcentimeter partially exophytic focus from the posterior inferior pole of the right kidney is not characterized. The previously described lesion in the interpolar aspect of the right kidney is not visualized on this noncontrast CT. The visualized ureters and urinary bladder appear unremarkable. Stomach/Bowel: There is no bowel obstruction or active inflammation. The appendix is normal. Vascular/Lymphatic: Mild aortoiliac atherosclerotic disease. The IVC is unremarkable. No portal venous gas. There is no adenopathy. There is stranding of the retroperitoneal fat and tissue plane surrounding the iliac vasculature, likely related to recent line placement. No fluid collection or large hematoma. Reproductive: The prostate and seminal vesicles are grossly unremarkable. No pelvic mass. Other: There is mild diffuse subcutaneous edema. No fluid collection. Musculoskeletal: Degenerative changes of the spine. No acute osseous pathology. IMPRESSION: 1. Interval placement LVAD with loculated appearing fluid in the anterior mediastinum and surrounding the outflow graft. This is nonspecific and although may represent postsurgical seroma, infection is not excluded. Clinical correlation is recommended. 2. Ill-defined faint high attenuating content above the aortic valve leaflets suboptimally characterized. Blood clot is not excluded. 3. Small bilateral pleural effusions and findings of asymmetric edema. Pneumonia is  not excluded. 4. No bowel obstruction. Normal appendix. 5. A 4 mm nonobstructing right renal upper pole calculus. No hydronephrosis. 6. Aortic Atherosclerosis (ICD10-I70.0). These results were called by telephone at the time of interpretation on 07/09/2019 at 5:15 pm to Dr. Orvan Seen nurse, Valentina Gu, who verbally acknowledged these results. Electronically Signed   By: Anner Crete M.D.   On: 07/09/2019 17:49   CARDIAC CATHETERIZATION  Result Date: 07/10/2019 Findings: RA = 18 RV = 49/19 PA = 51/32 (39) PCW = 28 Fick cardiac output/index = 5.2/2.5 FA sat = 96% PA sat = 59% Assessment: 1. Elevated biventricular pressures despite VAD support 2. Successful IABP placement Glori Bickers, MD 9:25 PM  DG Chest Port 1 View  Result Date: 07/10/2019 CLINICAL DATA:  Intubation. EXAM: PORTABLE CHEST 1 VIEW COMPARISON:  07/09/2019. FINDINGS: Interim placement of Swan-Ganz catheter its tip is in the distal right pulmonary artery. Endotracheal tube, NG tube, right PICC line stable position. Prior CABG. Left ventricular assist device in stable position. Stable cardiomegaly. No pulmonary venous congestion. Mild bilateral interstitial prominence, improved from prior exam. Bibasilar atelectasis. No pleural effusion or pneumothorax. IMPRESSION: 1. Interim placement Swan-Ganz catheter, its tip is in the distal right pulmonary artery. Remaining lines and tubes in stable position. 2. Prior CABG. Left ventricular assist device stable position. Stable cardiomegaly. No pulmonary venous congestion. 3. Mild bilateral interstitial prominence, improved from prior exam. Bibasilar atelectasis. Electronically Signed   By: Marcello Moores  Register   On: 07/10/2019 07:20   DG CHEST PORT 1 VIEW  Result Date: 07/09/2019 CLINICAL DATA:  54 year old male with endotracheal tube placement. EXAM: PORTABLE CHEST 1 VIEW COMPARISON:  Earlier radiograph dated 07/09/2019. FINDINGS: Endotracheal tube with tip approximately 4.3 cm above the carina. An enteric  tube extends below the diaphragm with tip beyond the inferior margin of the image. Right-sided PICC with tip over central SVC. There is cardiomegaly with vascular congestion and edema. Pneumonia is not excluded clinical correlation is recommended. Small bilateral pleural effusions. No pneumothorax. Median sternotomy wires and left ventricular assist device. No acute osseous pathology. IMPRESSION: 1. Endotracheal tube above the carina. 2. Cardiomegaly with findings of CHF. Electronically Signed   By: Anner Crete M.D.   On: 07/09/2019 19:39   DG CHEST PORT 1 VIEW  Result Date: 07/09/2019 CLINICAL DATA:  dyspnea Pt states he has chest pressure and dizziness EXAM: PORTABLE CHEST 1 VIEW COMPARISON:  Chest radiograph 07/08/2019 FINDINGS: Stable right upper extremity PICC. Left ventricular assist device. Unchanged cardiomediastinal contours with surgical changes status post median sternotomy. There are bilateral right greater than left interstitial opacities which appear mildly increased in the right compared to prior. Consolidation in the lower right lung could reflect atelectasis versus infection. No evidence of pneumothorax. No definite pleural effusion. IMPRESSION: 1. Bilateral right greater than left interstitial opacities which appear mildly increased on the right compared to prior. 2. Consolidation in the lower right lung could reflect atelectasis versus infection. Electronically Signed   By: Audie Pinto M.D.   On: 07/09/2019 08:13   ECHOCARDIOGRAM LIMITED  Result Date: 07/09/2019    ECHOCARDIOGRAM LIMITED REPORT   Patient Name:   ADAIR LAUDERBACK Date of Exam: 07/09/2019 Medical Rec #:  [id number]       Height:       69.0 in Accession #:    [id number]      Weight:       207.5 lb Date of Birth:  [birth year]       BSA:          2.098 m Patient Age:    38 years        BP:           0/0 mmHg Patient Gender: M               HR:           70 bpm. Exam Location:  Inpatient Procedure: Limited Echo, Cardiac  Doppler and Color Doppler Indications:    Congestive Heart Failure 428.0 / I50.9  History:        Patient has prior history of Echocardiogram examinations, most                 recent 06/29/2019. Cardiomyopathy and CHF, CAD, Arrythmias:STEMI  and Ventricular Fibrillation; Risk Factors:Current Smoker.                 Cardiogenic shock.  Sonographer:    Vickie Epley RDCS Referring Phys: [phone] AMY D CLEGG IMPRESSIONS  1. Left ventricular ejection fraction, by estimation, is <20%. The left ventricle has severely decreased function. The left ventricle demonstrates global hypokinesis. The left ventricular internal cavity size was mildly dilated. There is mild left ventricular hypertrophy.  2. Right ventricular systolic function is mildly reduced. The right ventricular size is normal.  3. The mitral valve is normal in structure. Trivial mitral valve regurgitation. No evidence of mitral stenosis.  4. The aortic valve is poorly visualized but does not appear to open.  5. Ramp echo done with speed increased from 5000 rpm to 5100 rpm. FINDINGS  Left Ventricle: Left ventricular ejection fraction, by estimation, is <20%. The left ventricle has severely decreased function. The left ventricle demonstrates global hypokinesis. The left ventricular internal cavity size was mildly dilated. There is mild left ventricular hypertrophy. Right Ventricle: The right ventricular size is normal. Right ventricular systolic function is mildly reduced. Mitral Valve: The mitral valve is normal in structure. Trivial mitral valve regurgitation. No evidence of mitral valve stenosis.  LEFT VENTRICLE PLAX 2D LVIDd:         5.75 cm  Loralie Champagne MD Electronically signed by Loralie Champagne MD Signature Date/Time: 07/09/2019/2:49:33 PM    Final    US Abdomen Limited RUQ  Result Date: 07/09/2019 CLINICAL DATA:  Sepsis EXAM: ULTRASOUND ABDOMEN LIMITED RIGHT UPPER QUADRANT COMPARISON:  MRI abdomen 06/21/2019 FINDINGS: Gallbladder: Normally  distended with minimal dependent sludge. Upper normal gallbladder wall thickness. No shadowing calculi, pericholecystic fluid or sonographic Murphy sign. Common bile duct: Diameter: Normal caliber 3 mm diameter Liver: Upper normal echogenicity. No focal hepatic mass or nodularity. No intrahepatic biliary dilatation. Portal vein is patent on color Doppler imaging with normal direction of blood flow towards the liver. Other: Small RIGHT pleural effusion noted. IMPRESSION: Minimal dependent sludge within gallbladder. Small RIGHT pleural effusion. Otherwise negative exam Electronically Signed   By: Lavonia Dana M.D.   On: 07/09/2019 11:26    Patient Profile     Elridge Stemm Noguesis a 54 y.o.maleadmitted with acute MI and cardiogenic shock. 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 Now with refractory VT/VF post VAD.  Assessment & Plan    1. Recurrent sustained VT/VF Pt worsened throughout day yesterday ultimately requiring intubation and IABP for mechanical support.  Quiescent since returning from cath lab on IABP as of my exam. Continue amiodarone and lidocaine Continue ranexa He has no other good options for rhythm control. His potassium is 3.3 this am. Supp ordered Mg 2.2. Pacing unlikely to improve VT burden and could possible increase O2 demand to tissue. Consider weaning as tolerated Coox 69% with mechanical support Withheavy CAD burden would consider low dose beta blockadeas soon as thought he would tolerate(not candidate at this time in acute shock) Ramp Echo performed 07/09/2019.   Pt remains critically ill with a guarded prognosis.   For questions or updates, please contact [place] Please consult [email or web address] for contact info under Cardiology/STEMI.  Signed, Shirley Friar, PA-C  07/10/2019, 10:06 AM

## 2019-07-10 NOTE — Progress Notes (Signed)
[place] for Coumadin>>heparin Indication: LVAD  Allergies  Allergen Reactions  . Codeine Hives    Patient Measurements: Height: 5\' 9"  (175.3 cm) Weight: 94 kg (207 lb 3.7 oz) IBW/kg (Calculated) : 70.7 Heparin Dosing Weight: ~ 92 kg  Vital Signs: Temp: 97.7 F (36.5 C) (07/09 0700) Temp Source: Core (07/09 0401) BP: 136/48 (07/09 0400) Pulse Rate: 42 (07/09 0700)  Labs: Recent Labs    07/09/19 0312 07/09/19 8891 07/09/19 0740 07/09/19 0920 07/09/19 1522 07/09/19 1915 07/09/19 2238 07/09/19 2238 07/09/19 2244 07/09/19 2244 07/10/19 0423 07/10/19 0434  HGB 10.9*  --   --   --   --    < > 9.9*   < > 10.5*   < > 9.9* 9.3*  HCT 34.1*  --   --   --   --    < > 30.7*   < > 31.0*  --  29.0* 28.8*  PLT 413*  --   --   --   --   --  355  --   --   --   --  309  LABPROT 51.3*   < >  --   --  35.6*  --  48.5*  --   --   --   --  38.4*  INR 5.9*   < >  --   --  3.7*  --  5.5*  --   --   --   --  4.1*  CREATININE 1.32*   < > 1.34*  --   --   --  1.81*  --   --   --   --  1.75*  TROPONINIHS  --   --  6,945* 3,481*  --   --   --   --   --   --   --   --    < > = values in this interval not displayed.    Estimated Creatinine Clearance: 54 mL/min (A) (by C-G formula based on SCr of 1.75 mg/dL (H)).   Medical History: Past Medical History:  Diagnosis Date  . Acute anterolateral wall MI ([place])   . CAD, multiple vessel   . Hypertension   . Ischemic cardiomyopathy   . Systolic heart failure ([place])   . Tobacco abuse     Medications:  Infusions:  . sodium chloride    . sodium chloride    . sodium chloride    . amiodarone 60 mg/hr (07/10/19 0700)  . amiodarone    . ceFEPime (MAXIPIME) IV 2 g (07/10/19 0800)  . fentaNYL infusion INTRAVENOUS 150 mcg/hr (07/10/19 0700)  . lactated ringers    . lidocaine 1 mg/min (07/10/19 0744)  . vancomycin Stopped (07/10/19 0132)  . vasopressin 0.03 Units/min (07/10/19 0700)    Assessment: 54 yo  male s/p LVAD HM3 placement 6/24.  Pharmacy asked to dose  anticoagulation with IV heparin and Coumadin. No overt bleeding or complications noted.    Was intubated and received IABP last night - off heparin given elevated INR.   INR today is supratherapeutic at 4.1 after FFP. Hgb 9.3, plt 309. No s/sx of bleeding overnight. LDH continues to climb to 2605. LFTs up.   Goal of Therapy:  Heparin level: 0.2-0.5 - currently dosed per discussion with MD INR 2-2.5  Monitor platelets by anticoagulation protocol: Yes   Plan:  -Given on IABP, hold warfarin -Recheck INR tonight after vitamin K, if INR 3 or less, will start fixed rate heparin  infusion at 500 units/hr  -Daily INR, HL, CBC, LDH  Antonietta Jewel, PharmD, [facility] Pharmacist  Phone: [phone] 07/10/2019 8:06 AM  Please check AMION for all [place] phone numbers After 10:00 PM, call [place] [phone]

## 2019-07-10 NOTE — Progress Notes (Signed)
PT Cancellation Note  Patient Details Name: Aaron [name] MRN: [id number] DOB: 10-Feb-1965   Cancelled Treatment:    Reason Eval/Treat Not Completed: Patient not medically ready (pt on vent with IABP)   Sole Lengacher B Caine Barfield 07/10/2019, 6:44 AM  Bayard Males, PT Acute Rehabilitation Services Pager: [phone] Office: [phone]

## 2019-07-10 NOTE — Progress Notes (Signed)
Patient ID: Aaron [name], male   DOB: 28-Feb-1965, 54 y.o.   MRN: [id number]     Advanced Heart Failure Rounding Note  PCP-Cardiologist: No primary care provider on file.   Subjective:    - HM3 placement 6/24  - Had 2 episodes of VF requiring emergent DC-CV on 07/02/19 - Recurrent VF on 7/2 and broke spontaneously.  - 7/3 Recurrent VF at 2a.-> emergent DC-CV.  - 07/07/2019 VT/Torsades -> repeat DC_CV - 07/08/2019 VF/Torsades x2--> DC-CV x2.  - 07/09/19 developed sepsis/PNA -> intubated - 07/09/19 Multiple episodes of VT/VF -> IABP placed. AV pacing via EPW  Remains on lidocaine 1 mg + amio drip 60 mg per hour.   Developed sepsis/PNA with respiratory failure. Had refractory VT/VF yesterday. IABP/swan placed. AV pacing with EPW.   On VP 0.03  Intubated. VT/VF quiet overnight. Remains on amio/lido. Started on iNO by Dr. Prescott Gum this am.  LDH 2,605   Luiz Blare #s done personally.  CVP 12 PA 30/18 (23) PCWP 17 Thermo 5.3/2.6  SVR 1086 PVR 1.0   LVAD Interrogation HM III: Speed: 5200 Flow: 3.5   PI: 9.4 Power: 3.6 Objective:   Weight Range: 94 kg Body mass index is 30.6 kg/m.   Vital Signs:   Temp:  [97.3 F (36.3 C)-98.8 F (37.1 C)] 97.3 F (36.3 C) (07/09 0900) Pulse Rate:  [25-127] 43 (07/09 0900) Resp:  [11-88] 20 (07/09 0900) BP: (75-154)/(34-123) 136/48 (07/09 0400) SpO2:  [84 %-100 %] 100 % (07/09 0900) FiO2 (%):  [40 %-100 %] 60 % (07/09 0818) Weight:  [94 kg] 94 kg (07/09 0500) Last BM Date: 07/09/19  Weight change: Filed Weights   07/08/19 0514 07/09/19 0600 07/10/19 0500  Weight: 96.5 kg 94.1 kg 94 kg    Intake/Output:   Intake/Output Summary (Last 24 hours) at 07/10/2019 0939 Last data filed at 07/10/2019 0900 Gross per 24 hour  Intake 2898.22 ml  Output 2425 ml  Net 473.22 ml      Physical Exam   General:  Intubated/sedated. Ill appearing HEENT: normal  + ETT Neck: supple. RIJ swan.  Carotids 2+ bilat; no bruits. No lymphadenopathy or thryomegaly  appreciated. Cor: LVAD hum.  Lungs: + crackles Abdomen: obese soft, nontender, non-distended. No hepatosplenomegaly. No bruits or masses. Good bowel sounds. Driveline site clean. Anchor in place.  Extremities: no cyanosis, clubbing, rash. Warm 1+ Neuro. Intubated/sedated   Telemetry   SR AV paced 80. + PACs Personally reviewed    Labs    CBC Recent Labs    07/09/19 2238 07/09/19 2244 07/10/19 0423 07/10/19 0434  WBC 25.7*  --   --  19.2*  HGB 9.9*   < > 9.9* 9.3*  HCT 30.7*   < > 29.0* 28.8*  MCV 95.9  --   --  96.6  PLT 355  --   --  309   < > = values in this interval not displayed.   Basic Metabolic Panel Recent Labs    07/09/19 0312 07/09/19 0740 07/09/19 2238 07/09/19 2244 07/10/19 0423 07/10/19 0434  NA 124*   < > 127*   < > 132* 131*  K 4.8   < > 3.9   < > 3.4* 3.3*  CL 90*   < > 94*  --   --  94*  CO2 20*   < > 20*  --   --  25  GLUCOSE 297*   < > 167*  --   --  148*  BUN 17   < >  26*  --   --  27*  CREATININE 1.32*   < > 1.81*  --   --  1.75*  CALCIUM 8.0*   < > 7.6*  --   --  7.8*  MG 2.6*  --   --   --   --  2.4   < > = values in this interval not displayed.   Liver Function Tests Recent Labs    07/09/19 0740  AST 1,854*  ALT 1,377*  ALKPHOS 146*  BILITOT 1.1  PROT 6.1*  ALBUMIN 2.5*   No results for input(s): LIPASE, AMYLASE in the last 72 hours. Cardiac Enzymes No results for input(s): CKTOTAL, CKMB, CKMBINDEX, TROPONINI in the last 72 hours.  BNP: BNP (last 3 results) Recent Labs    06/26/19 0445 07/02/19 0531 07/09/19 0313  BNP 1,301.1* 1,646.4* 1,815.2*    ProBNP (last 3 results) No results for input(s): PROBNP in the last 8760 hours.   D-Dimer No results for input(s): DDIMER in the last 72 hours. Hemoglobin A1C No results for input(s): HGBA1C in the last 72 hours. Fasting Lipid Panel No results for input(s): CHOL, HDL, LDLCALC, TRIG, CHOLHDL, LDLDIRECT in the last 72 hours. Thyroid Function Tests No results for  input(s): TSH, T4TOTAL, T3FREE, THYROIDAB in the last 72 hours.  Invalid input(s): FREET3  Other results:   Imaging    CT ABDOMEN PELVIS WO CONTRAST  Result Date: 07/09/2019 CLINICAL DATA:  54 year old male with worsening heart failure symptoms. Elevated white blood cell. EXAM: CT CHEST, ABDOMEN AND PELVIS WITHOUT CONTRAST TECHNIQUE: Multidetector CT imaging of the chest, abdomen and pelvis was performed following the standard protocol without IV contrast. COMPARISON:  CT dated 06/17/2019. FINDINGS: CT CHEST FINDINGS Cardiovascular: There is moderate cardiomegaly increased in size since the prior CT. There has been interval placement of a left ventricular assist device. There is somewhat loculated appearing fluid in the anterior mediastinum and surrounding the outflow graft. This is nonspecific and although may represent postsurgical seroma, infection is not excluded. Clinical correlation is recommended. The fluid in the anterior mediastinum measures 8.5 x 3.9 cm in greatest axial dimensions. Tiny foci of air noted within this fluid. No fluid noted along the drive line in the anterior chest wall. There is mild atherosclerotic calcification of the aorta. There is mild dilatation of the main pulmonary trunk suggestive of a degree of pulmonary hypertension. There is advanced 3 vessel coronary vascular calcification. There is a faint 2 cm rounded high attenuation superior to the aortic valve, in the sinotubular aorta of indeterminate etiology. Although this may represent pooling of residual contrast, a blood clot is not excluded. There is a right-sided PICC with tip in central SVC close to the cavoatrial junction. Mediastinum/Nodes: Mildly enlarged pretracheal lymph node measures 15 mm in short axis. No hilar adenopathy. The esophagus is grossly unremarkable. Lungs/Pleura: There are small bilateral pleural effusions, left greater right. There is partial consolidative changes of the left lower lobe with air  bronchograms which may represent atelectasis or pneumonia. There is extensive interstitial and interlobular septal prominence and ground-glass opacity throughout the right lung and to a lesser degree involving the left upper lobe. Findings most likely represent asymmetric edema. ARDS or pneumonia are not excluded. There is no pneumothorax. The central airways are patent. Musculoskeletal: Median sternotomy wires. Degenerative changes of the spine. No acute osseous pathology. CT ABDOMEN PELVIS FINDINGS No intra-abdominal free air or free fluid. Hepatobiliary: Probable mild fatty liver. No intrahepatic biliary dilatation. High attenuating content within the  gallbladder likely vicarious excretion of recently administered contrast. Pancreas: Unremarkable. No pancreatic ductal dilatation or surrounding inflammatory changes. Spleen: Normal in size without focal abnormality. Adrenals/Urinary Tract: The adrenal glands are unremarkable. There is a 4 mm nonobstructing right renal upper pole calculus. There is no hydronephrosis on either side. Indeterminate subcentimeter partially exophytic focus from the posterior inferior pole of the right kidney is not characterized. The previously described lesion in the interpolar aspect of the right kidney is not visualized on this noncontrast CT. The visualized ureters and urinary bladder appear unremarkable. Stomach/Bowel: There is no bowel obstruction or active inflammation. The appendix is normal. Vascular/Lymphatic: Mild aortoiliac atherosclerotic disease. The IVC is unremarkable. No portal venous gas. There is no adenopathy. There is stranding of the retroperitoneal fat and tissue plane surrounding the iliac vasculature, likely related to recent line placement. No fluid collection or large hematoma. Reproductive: The prostate and seminal vesicles are grossly unremarkable. No pelvic mass. Other: There is mild diffuse subcutaneous edema. No fluid collection. Musculoskeletal:  Degenerative changes of the spine. No acute osseous pathology. IMPRESSION: 1. Interval placement LVAD with loculated appearing fluid in the anterior mediastinum and surrounding the outflow graft. This is nonspecific and although may represent postsurgical seroma, infection is not excluded. Clinical correlation is recommended. 2. Ill-defined faint high attenuating content above the aortic valve leaflets suboptimally characterized. Blood clot is not excluded. 3. Small bilateral pleural effusions and findings of asymmetric edema. Pneumonia is not excluded. 4. No bowel obstruction. Normal appendix. 5. A 4 mm nonobstructing right renal upper pole calculus. No hydronephrosis. 6. Aortic Atherosclerosis (ICD10-I70.0). These results were called by telephone at the time of interpretation on 07/09/2019 at 5:15 pm to Dr. Orvan Seen nurse, Valentina Gu, who verbally acknowledged these results. Electronically Signed   By: Anner Crete M.D.   On: 07/09/2019 17:49   CT CHEST WO CONTRAST  Result Date: 07/09/2019 CLINICAL DATA:  54 year old male with worsening heart failure symptoms. Elevated white blood cell. EXAM: CT CHEST, ABDOMEN AND PELVIS WITHOUT CONTRAST TECHNIQUE: Multidetector CT imaging of the chest, abdomen and pelvis was performed following the standard protocol without IV contrast. COMPARISON:  CT dated 06/17/2019. FINDINGS: CT CHEST FINDINGS Cardiovascular: There is moderate cardiomegaly increased in size since the prior CT. There has been interval placement of a left ventricular assist device. There is somewhat loculated appearing fluid in the anterior mediastinum and surrounding the outflow graft. This is nonspecific and although may represent postsurgical seroma, infection is not excluded. Clinical correlation is recommended. The fluid in the anterior mediastinum measures 8.5 x 3.9 cm in greatest axial dimensions. Tiny foci of air noted within this fluid. No fluid noted along the drive line in the anterior chest wall.  There is mild atherosclerotic calcification of the aorta. There is mild dilatation of the main pulmonary trunk suggestive of a degree of pulmonary hypertension. There is advanced 3 vessel coronary vascular calcification. There is a faint 2 cm rounded high attenuation superior to the aortic valve, in the sinotubular aorta of indeterminate etiology. Although this may represent pooling of residual contrast, a blood clot is not excluded. There is a right-sided PICC with tip in central SVC close to the cavoatrial junction. Mediastinum/Nodes: Mildly enlarged pretracheal lymph node measures 15 mm in short axis. No hilar adenopathy. The esophagus is grossly unremarkable. Lungs/Pleura: There are small bilateral pleural effusions, left greater right. There is partial consolidative changes of the left lower lobe with air bronchograms which may represent atelectasis or pneumonia. There is extensive interstitial and  interlobular septal prominence and ground-glass opacity throughout the right lung and to a lesser degree involving the left upper lobe. Findings most likely represent asymmetric edema. ARDS or pneumonia are not excluded. There is no pneumothorax. The central airways are patent. Musculoskeletal: Median sternotomy wires. Degenerative changes of the spine. No acute osseous pathology. CT ABDOMEN PELVIS FINDINGS No intra-abdominal free air or free fluid. Hepatobiliary: Probable mild fatty liver. No intrahepatic biliary dilatation. High attenuating content within the gallbladder likely vicarious excretion of recently administered contrast. Pancreas: Unremarkable. No pancreatic ductal dilatation or surrounding inflammatory changes. Spleen: Normal in size without focal abnormality. Adrenals/Urinary Tract: The adrenal glands are unremarkable. There is a 4 mm nonobstructing right renal upper pole calculus. There is no hydronephrosis on either side. Indeterminate subcentimeter partially exophytic focus from the posterior  inferior pole of the right kidney is not characterized. The previously described lesion in the interpolar aspect of the right kidney is not visualized on this noncontrast CT. The visualized ureters and urinary bladder appear unremarkable. Stomach/Bowel: There is no bowel obstruction or active inflammation. The appendix is normal. Vascular/Lymphatic: Mild aortoiliac atherosclerotic disease. The IVC is unremarkable. No portal venous gas. There is no adenopathy. There is stranding of the retroperitoneal fat and tissue plane surrounding the iliac vasculature, likely related to recent line placement. No fluid collection or large hematoma. Reproductive: The prostate and seminal vesicles are grossly unremarkable. No pelvic mass. Other: There is mild diffuse subcutaneous edema. No fluid collection. Musculoskeletal: Degenerative changes of the spine. No acute osseous pathology. IMPRESSION: 1. Interval placement LVAD with loculated appearing fluid in the anterior mediastinum and surrounding the outflow graft. This is nonspecific and although may represent postsurgical seroma, infection is not excluded. Clinical correlation is recommended. 2. Ill-defined faint high attenuating content above the aortic valve leaflets suboptimally characterized. Blood clot is not excluded. 3. Small bilateral pleural effusions and findings of asymmetric edema. Pneumonia is not excluded. 4. No bowel obstruction. Normal appendix. 5. A 4 mm nonobstructing right renal upper pole calculus. No hydronephrosis. 6. Aortic Atherosclerosis (ICD10-I70.0). These results were called by telephone at the time of interpretation on 07/09/2019 at 5:15 pm to Dr. Orvan Seen nurse, Valentina Gu, who verbally acknowledged these results. Electronically Signed   By: Anner Crete M.D.   On: 07/09/2019 17:49   CARDIAC CATHETERIZATION  Result Date: 07/10/2019 Findings: RA = 18 RV = 49/19 PA = 51/32 (39) PCW = 28 Fick cardiac output/index = 5.2/2.5 FA sat = 96% PA sat = 59%  Assessment: 1. Elevated biventricular pressures despite VAD support 2. Successful IABP placement Glori Bickers, MD 9:25 PM  DG Chest Port 1 View  Result Date: 07/10/2019 CLINICAL DATA:  Intubation. EXAM: PORTABLE CHEST 1 VIEW COMPARISON:  07/09/2019. FINDINGS: Interim placement of Swan-Ganz catheter its tip is in the distal right pulmonary artery. Endotracheal tube, NG tube, right PICC line stable position. Prior CABG. Left ventricular assist device in stable position. Stable cardiomegaly. No pulmonary venous congestion. Mild bilateral interstitial prominence, improved from prior exam. Bibasilar atelectasis. No pleural effusion or pneumothorax. IMPRESSION: 1. Interim placement Swan-Ganz catheter, its tip is in the distal right pulmonary artery. Remaining lines and tubes in stable position. 2. Prior CABG. Left ventricular assist device stable position. Stable cardiomegaly. No pulmonary venous congestion. 3. Mild bilateral interstitial prominence, improved from prior exam. Bibasilar atelectasis. Electronically Signed   By: Marcello Moores  Register   On: 07/10/2019 07:20   DG CHEST PORT 1 VIEW  Result Date: 07/09/2019 CLINICAL DATA:  54 year old male with endotracheal  tube placement. EXAM: PORTABLE CHEST 1 VIEW COMPARISON:  Earlier radiograph dated 07/09/2019. FINDINGS: Endotracheal tube with tip approximately 4.3 cm above the carina. An enteric tube extends below the diaphragm with tip beyond the inferior margin of the image. Right-sided PICC with tip over central SVC. There is cardiomegaly with vascular congestion and edema. Pneumonia is not excluded clinical correlation is recommended. Small bilateral pleural effusions. No pneumothorax. Median sternotomy wires and left ventricular assist device. No acute osseous pathology. IMPRESSION: 1. Endotracheal tube above the carina. 2. Cardiomegaly with findings of CHF. Electronically Signed   By: Arash  Radparvar M.D.   On: 07/09/2019 19:39   US Abdomen Limited  RUQ  Result Date: 07/09/2019 CLINICAL DATA:  Sepsis EXAM: ULTRASOUND ABDOMEN LIMITED RIGHT UPPER QUADRANT COMPARISON:  MRI abdomen 06/21/2019 FINDINGS: Gallbladder: Normally distended with minimal dependent sludge. Upper normal gallbladder wall thickness. No shadowing calculi, pericholecystic fluid or sonographic Murphy sign. Common bile duct: Diameter: Normal caliber 3 mm diameter Liver: Upper normal echogenicity. No focal hepatic mass or nodularity. No intrahepatic biliary dilatation. Portal vein is patent on color Doppler imaging with normal direction of blood flow towards the liver. Other: Small RIGHT pleural effusion noted. IMPRESSION: Minimal dependent sludge within gallbladder. Small RIGHT pleural effusion. Otherwise negative exam Electronically Signed   By: Mark  Boles M.D.   On: 07/09/2019 11:26     Medications:     Scheduled Medications: . aspirin EC  81 mg Oral Daily  . atorvastatin  80 mg Oral Daily  . bisacodyl  10 mg Oral Daily   Or  . bisacodyl  10 mg Rectal Daily  . chlorhexidine gluconate (MEDLINE KIT)  15 mL Mouth Rinse BID  . Chlorhexidine Gluconate Cloth  6 each Topical Daily  . clonazePAM  0.5 mg Oral BID  . colchicine  0.6 mg Oral BID  . digoxin  0.125 mg Oral Daily  . docusate sodium  200 mg Oral Daily  . folic acid  1 mg Oral Daily  . gabapentin  300 mg Oral TID  . insulin aspart  0-15 Units Subcutaneous Q4H  . mouth rinse  15 mL Mouth Rinse 10 times per day  . midodrine  5 mg Oral TID WC  . multivitamin with minerals  1 tablet Oral Daily  . pantoprazole  40 mg Oral Daily  . potassium chloride  40 mEq Oral Q6H  . ranolazine  500 mg Oral BID  . sodium chloride flush  10-40 mL Intracatheter Q12H  . sodium chloride flush  3 mL Intravenous Q12H  . sodium chloride flush  3 mL Intravenous Q12H  . thiamine  100 mg Oral Daily  . Warfarin - Pharmacist Dosing Inpatient   Does not apply q1600    Infusions: . sodium chloride    . sodium chloride    . sodium chloride     . amiodarone 60 mg/hr (07/10/19 0900)  . amiodarone    . ceFEPime (MAXIPIME) IV Stopped (07/10/19 0830)  . fentaNYL infusion INTRAVENOUS 200 mcg/hr (07/10/19 0900)  . lactated ringers    . lidocaine 1 mg/min (07/10/19 0900)  . vancomycin 1,250 mg (07/10/19 0912)  . vasopressin 0.03 Units/min (07/10/19 0900)    PRN Medications: Place/Maintain arterial line **AND** sodium chloride, sodium chloride, acetaminophen, ALPRAZolam, bisacodyl, calcium carbonate, dextrose, fentaNYL (SUBLIMAZE) injection, ondansetron (ZOFRAN) IV, oxyCODONE, pneumococcal 23 valent vaccine, sodium chloride flush, sodium chloride flush, traMADol, traZODone    Patient Profile   Aaron [name] --post MI cardiogenic shock/ optimization prior to anticipated CABG,   at the request of Dr. Marlou Porch, Cardiology.    Assessment/Plan   1. Refractory VT/VF  - developed VF on 7/1 x 2 requiring emergent DC-CV - On 7/2 had self-terminating episode - On 7/3 persistent VF/torsades -> DC-CV -On 7/6  VF/torsades.  - On 7/7 VF /Torsades on 2 separate occasions-> DC-CV x2.  - On 7/8 multiple episodes on VT/VF (incessant) -> unclear which intervention broke it - Remains on lido/amio. He is twitching but lido level 2.6. Will continue at least another 48 hours - Continue IABP and AV pacing for now - keep K> 4.0 Mg >2.0 .  - VAD speed initially reduced to 5000 but output low. Now at 5100 (unable to go higher due to suction events) - Keep off inotropes - Keep IABP over weekend    2. Acute Anterolateral STEMI/ Multivessel CAD - Emergent cath showed severe multivessel disease w/ 75% mid- distal LM, 100% prox LAD, not amendable to PCI (unable to cross w/ guide wire, probable CTO), 100% prox LCx (probable CTO) and 100% ostial RCA (probable CTO). There were mild to moderate collaterals right to left to the LAD, minimal right to right collaterals an minimal left to left collaterals. LVEF 25% - Hs trop peaked 27,000. - No targets for CABG  / PCI.   - S/P HMIII VAD - suspect VF is ischemically-mediated   3.  Acute Systolic Heart Failure --->Cardiogenic Shock - 2/2 acute MI. LVEF by LVG 25% - IABP out 6/18 but replaced 6/23. Removed in OR on 6/24  - HMIII LVAD  Placed 6/24 - Extubated 6/25  - Was on milrinone for RV failure. Now off due to VF.  - Co-ox 45%. -> 69% with IABP - Continue digoxin.  - INR 4.1. rec'd 4u FFP so far. Give vitamin k. Start heparin at 500u/hr for IABP once INR < 3.0 - LDH 490-->1752 -> 2600 suspect due to septic shock. No evidence pump thrombosis  - Ramp echo 7/8 RV only mildly reduced. Good cannula position. Increase VAD speed as tolerated - VAD interrogated personally.   4. Acute hypoxic respiratory failure - intubated 7/8 - Chest CT with bilateral PNA R>L - CCM following - continue vanc/cefipime (started 7/8) - Check PCT - BCx and BAL NGTD  5. Shock liver - due to shock - follow  6. Tobacco Abuse - 30 pack years  - smoking cessation advised  - Will need to quit if to be considered for heart transplant in the future   7. Renal mass = RCC - MRI abdomen- 1.8 cm right renal lesion, most consistent with renal cell carcinoma. No evidence of renal vein involvement or nodal metastasis. - Discussed w/ Urology and R RCC is small and will just require surveillance.  Ok to proceed with VAD from their standpoint.   8. Hyponatremia  -improving  9. Sleep Apnea - frequent apnea at night  - needs formal sleep study   - CPAP ordered while inpatient    CRITICAL CARE Performed by: Glori Bickers  Total critical care time: 45 minutes  Critical care time was exclusive of separately billable procedures and treating other patients.  Critical care was necessary to treat or prevent imminent or life-threatening deterioration.  Critical care was time spent personally by me (independent of midlevel providers or residents) on the following activities: development of treatment plan with patient  and/or surrogate as well as nursing, discussions with consultants, evaluation of patient's response to treatment, examination of patient, obtaining history from patient or surrogate, ordering and  performing treatments and interventions, ordering and review of laboratory studies, ordering and review of radiographic studies, pulse oximetry and re-evaluation of patient's condition.   Daniel Bensimhon, MD  9:50 AM   

## 2019-07-10 NOTE — Progress Notes (Signed)
Patient cooperative, has multiple PI events, ongoing.  Continues to have episodes of jerking awake as if shocked.  Asked if having pain, patient denies.  Asked if he's dreaming, and nodes his head indicating yes.  Continue to offer support and assurance.

## 2019-07-10 NOTE — Progress Notes (Signed)
CSW visited bedside and patient sedated and intubated. No family at bedside at the time of visit. CSW continues to follow for supportive needs throughout implant hospitalization. Raquel Sarna, [place], [place]

## 2019-07-10 NOTE — Progress Notes (Signed)
LVAD Coordinator Rounding Note:  Transferred from [facility] ED on 06/17/19 with STEMI. Cath revealed multi-vessel CAD, wire would not cross to open LAD; IABP placed and pt transferred here for evaluation for CABG.   Dr. Orvan Seen evaluated and found he was not amenable to surgical or percutaneous revascularization due to poor targets. With severe depressed LV function and little coronary perfusion, LVAD was recommended.   HM III LVAD implanted on 06/25/19 by Dr. Orvan Seen under Destination Therapy criteria due to recent smoking history.  Developed sepsis/PNA with respiratory failure. Had refractory VT/VF yesterday. IABP/swan placed. AV pacing with EPW.   Intubated. VT/VF quiet overnight. Remains on amio/lido. Started on iNO by Dr. Prescott Gum this am.  Vital signs: Temp: 97.3 HR: 88 Doppler Pressure: 80 Art Line: 126/72 (87) O2 Sat: 100% on 60% FiO2 Wt: 207>225.7>222.6>214.3>213.4>203.9>205.3>209>212.7>207.4>207.2 lbs  LVAD parameters: Speed:  5200 Flow: 3.3 Power:  3.6w PI: 11.1 Hct: 28  Alarms: none Events:>100 today  Fixed speed: 5200 Low speed limit: 4900   Drive Line: CDI.  Drive line anchor secure. Every other day dressing changes. Next dressing change due 07/11/19 by nurse champion, VAD coordinator or pts sister Mechele Claude.  Counter Incision: Clean, dry, intact. Mild redness noted at site. Dermabond intact.   Labs:  LDH trend: 660>398>347>459>462>410>554>383>490>1752>2605  INR trend: 1.4>1.3>1.1>1.2>1.4>1.5>1.7>2.7>3.6>5.9>4.1  WBC trend: 22.9>19.2  AST/ALT: 1854/1377  Troponin: 3481   Anticoagulation Plan: -INR Goal:  2.0 - 2.5 -ASA Dose: 81 mg   Blood Products:  -Intra-op - 06/25/19>>6 units FFP   Device: N/A  Drips: Milrinone 0.125 mcg/kg/min>> off Lasix 8 mg/hr-off  Amiodarone 60mg /hr  Lidocaine 1 mg/min Vasopressin 0.03 u/min Fentanyl 200 mcg/hr   Arrythmias: was on Amiodarone pre implant for NSVT  Respiratory: extubated 06/26/19  Nitric  Oxide: off 06/26/19 -  Restarted 07/10/19 at 20 PPM  VAD Education: 1. Sister-Joanne is checked off to perform driveline dressing changes independently.  2. Pt discharge binder delivered to the room today for pt and sisters review. Pt vented/sedated. Will hold off on formal education.  Plan/Recommendations:  1. Call VAD pager if any VAD equipment or drive line issues. 2. Every other day dressing changes per VAD Coordinator, Nurse Davonna Belling, or pts sister Mechele Claude. Next dressing change due 07/11/19  Tanda Rockers RN [place] Coordinator  Office: [phone]  24/7 Pager: [phone]

## 2019-07-10 NOTE — Plan of Care (Signed)
  Problem: Education: Goal: Understanding of cardiac disease, CV risk reduction, and recovery process will improve Outcome: Progressing Goal: Understanding of medication regimen will improve Outcome: Progressing Goal: Individualized Educational Video(s) Outcome: Progressing   Problem: Activity: Goal: Ability to tolerate increased activity will improve Outcome: Progressing   Problem: Cardiac: Goal: Ability to achieve and maintain adequate cardiopulmonary perfusion will improve Outcome: Progressing   Problem: Health Behavior/Discharge Planning: Goal: Ability to safely manage health-related needs after discharge will improve Outcome: Progressing   Problem: Education: Goal: Knowledge of General Education information will improve Description: Including pain rating scale, medication(s)/side effects and non-pharmacologic comfort measures Outcome: Progressing   Problem: Health Behavior/Discharge Planning: Goal: Ability to manage health-related needs will improve Outcome: Progressing   Problem: Clinical Measurements: Goal: Ability to maintain clinical measurements within normal limits will improve Outcome: Progressing Goal: Will remain free from infection Outcome: Progressing Goal: Diagnostic test results will improve Outcome: Progressing Goal: Respiratory complications will improve Outcome: Progressing Goal: Cardiovascular complication will be avoided Outcome: Progressing   Problem: Activity: Goal: Risk for activity intolerance will decrease Outcome: Progressing   Problem: Nutrition: Goal: Adequate nutrition will be maintained Outcome: Progressing   Problem: Coping: Goal: Level of anxiety will decrease Outcome: Progressing   Problem: Elimination: Goal: Will not experience complications related to bowel motility Outcome: Progressing Goal: Will not experience complications related to urinary retention Outcome: Progressing   Problem: Pain Managment: Goal: General  experience of comfort will improve Outcome: Progressing   Problem: Safety: Goal: Ability to remain free from injury will improve Outcome: Progressing   Problem: Skin Integrity: Goal: Risk for impaired skin integrity will decrease Outcome: Progressing   Problem: Education: Goal: Knowledge of the prescribed therapeutic regimen will improve Outcome: Progressing   Problem: Activity: Goal: Risk for activity intolerance will decrease Outcome: Progressing   Problem: Cardiac: Goal: Ability to maintain an adequate cardiac output will improve Outcome: Progressing   Problem: Coping: Goal: Level of anxiety will decrease Outcome: Progressing   Problem: Fluid Volume: Goal: Risk for excess fluid volume will decrease Outcome: Progressing   Problem: Clinical Measurements: Goal: Ability to maintain clinical measurements within normal limits will improve Outcome: Progressing Goal: Will remain free from infection Outcome: Progressing   Problem: Respiratory: Goal: Will regain and/or maintain adequate ventilation Outcome: Progressing

## 2019-07-10 NOTE — Progress Notes (Signed)
Gratis for Coumadin>>heparin Indication: LVAD  Allergies  Allergen Reactions  . Codeine Hives    Patient Measurements: Height: 5\' 9"  (175.3 cm) Weight: 94 kg (207 lb 3.7 oz) IBW/kg (Calculated) : 70.7 Heparin Dosing Weight: ~ 92 kg  Vital Signs: Temp: 97.2 F (36.2 C) (07/09 2200) Temp Source: Core (07/09 1600) BP: 121/69 (07/09 2200) Pulse Rate: 73 (07/09 2200)  Labs: Recent Labs    07/09/19 0312 07/09/19 1096 07/09/19 0740 07/09/19 0920 07/09/19 1522 07/09/19 2238 07/09/19 2244 07/10/19 0423 07/10/19 0423 07/10/19 0434 07/10/19 1725 07/10/19 2111  HGB 10.9*  --   --   --    < > 9.9*   < > 9.9*   < > 9.3* 9.2*  --   HCT 34.1*  --   --   --    < > 30.7*   < > 29.0*  --  28.8* 27.0*  --   PLT 413*  --   --   --   --  355  --   --   --  309  --   --   LABPROT 51.3*  --   --   --    < > 48.5*  --   --   --  38.4*  --  43.0*  INR 5.9*  --   --   --    < > 5.5*  --   --   --  4.1*  --  4.7*  CREATININE 1.32*   < > 1.34*  --   --  1.81*  --   --   --  1.75*  --   --   TROPONINIHS  --   --  0,454* 3,481*  --   --   --   --   --   --   --   --    < > = values in this interval not displayed.    Estimated Creatinine Clearance: 54 mL/min (A) (by C-G formula based on SCr of 1.75 mg/dL (H)).  Assessment: 54 yo male s/p LVAD HM3 placement 6/24.  Pharmacy asked to dose  anticoagulation with IV heparin and Coumadin. No overt bleeding or complications noted.    Was intubated and received IABP last night - off heparin given elevated INR.   INR this evening even higher than previous at 4.7  Goal of Therapy:  Heparin level: 0.2-0.5 - currently dosed per discussion with MD INR 2-2.5  Monitor platelets by anticoagulation protocol: Yes   Plan:  -Discussed with on call cardiology, Dr Blossom Hoops - no intervention and f/u in AM  -Pending heparin start when INR 3 or less, will start fixed rate heparin infusion at 500 units/hr  -Daily INR, HL,  CBC, LDH  Barth Kirks, PharmD, BCPS, BCCCP Clinical Pharmacist [phone]  Please check AMION for all [place] numbers  07/10/2019 10:24 PM

## 2019-07-11 ENCOUNTER — Inpatient Hospital Stay (HOSPITAL_COMMUNITY): Payer: BC Managed Care – PPO

## 2019-07-11 DIAGNOSIS — J181 Lobar pneumonia, unspecified organism: Secondary | ICD-10-CM

## 2019-07-11 LAB — POCT I-STAT 7, (LYTES, BLD GAS, ICA,H+H)
Acid-base deficit: 1 mmol/L (ref 0.0–2.0)
Acid-base deficit: 2 mmol/L (ref 0.0–2.0)
Acid-base deficit: 2 mmol/L (ref 0.0–2.0)
Bicarbonate: 23.1 mmol/L (ref 20.0–28.0)
Bicarbonate: 21.5 mmol/L (ref 20.0–28.0)
Bicarbonate: 21.3 mmol/L (ref 20.0–28.0)
Calcium, Ion: 1.11 mmol/L — ABNORMAL LOW (ref 1.15–1.40)
Calcium, Ion: 1.13 mmol/L — ABNORMAL LOW (ref 1.15–1.40)
Calcium, Ion: 1.15 mmol/L (ref 1.15–1.40)
HCT: 28 % — ABNORMAL LOW (ref 39.0–52.0)
HCT: 30 % — ABNORMAL LOW (ref 39.0–52.0)
HCT: 28 % — ABNORMAL LOW (ref 39.0–52.0)
Hemoglobin: 9.5 g/dL — ABNORMAL LOW (ref 13.0–17.0)
Hemoglobin: 10.2 g/dL — ABNORMAL LOW (ref 13.0–17.0)
Hemoglobin: 9.5 g/dL — ABNORMAL LOW (ref 13.0–17.0)
O2 Saturation: 99 %
O2 Saturation: 99 %
O2 Saturation: 100 %
Patient temperature: 36.6
Patient temperature: 37.2
Patient temperature: 37.6
Potassium: 3.8 mmol/L (ref 3.5–5.1)
Potassium: 3.4 mmol/L — ABNORMAL LOW (ref 3.5–5.1)
Potassium: 3.8 mmol/L (ref 3.5–5.1)
Sodium: 136 mmol/L (ref 135–145)
Sodium: 142 mmol/L (ref 135–145)
Sodium: 142 mmol/L (ref 135–145)
TCO2: 24 mmol/L (ref 22–32)
TCO2: 22 mmol/L (ref 22–32)
TCO2: 22 mmol/L (ref 22–32)
pCO2 arterial: 33.3 mmHg (ref 32.0–48.0)
pCO2 arterial: 29.8 mmHg — ABNORMAL LOW (ref 32.0–48.0)
pCO2 arterial: 32 mmHg (ref 32.0–48.0)
pH, Arterial: 7.447 (ref 7.350–7.450)
pH, Arterial: 7.467 — ABNORMAL HIGH (ref 7.350–7.450)
pH, Arterial: 7.435 (ref 7.350–7.450)
pO2, Arterial: 140 mmHg — ABNORMAL HIGH (ref 83.0–108.0)
pO2, Arterial: 135 mmHg — ABNORMAL HIGH (ref 83.0–108.0)
pO2, Arterial: 165 mmHg — ABNORMAL HIGH (ref 83.0–108.0)

## 2019-07-11 LAB — BASIC METABOLIC PANEL
Anion gap: 10 (ref 5–15)
Anion gap: 12 (ref 5–15)
BUN: 30 mg/dL — ABNORMAL HIGH (ref 6–20)
BUN: 28 mg/dL — ABNORMAL HIGH (ref 6–20)
CO2: 21 mmol/L — ABNORMAL LOW (ref 22–32)
CO2: 20 mmol/L — ABNORMAL LOW (ref 22–32)
Calcium: 7.9 mg/dL — ABNORMAL LOW (ref 8.9–10.3)
Calcium: 8.1 mg/dL — ABNORMAL LOW (ref 8.9–10.3)
Chloride: 103 mmol/L (ref 98–111)
Chloride: 107 mmol/L (ref 98–111)
Creatinine, Ser: 1.59 mg/dL — ABNORMAL HIGH (ref 0.61–1.24)
Creatinine, Ser: 1.5 mg/dL — ABNORMAL HIGH (ref 0.61–1.24)
GFR calc Af Amer: 56 mL/min — ABNORMAL LOW (ref >60)
GFR calc Af Amer: 60 mL/min — ABNORMAL LOW (ref >60)
GFR calc non Af Amer: 48 mL/min — ABNORMAL LOW (ref >60)
GFR calc non Af Amer: 52 mL/min — ABNORMAL LOW (ref >60)
Glucose, Bld: 111 mg/dL — ABNORMAL HIGH (ref 70–99)
Glucose, Bld: 107 mg/dL — ABNORMAL HIGH (ref 70–99)
Potassium: 3.7 mmol/L (ref 3.5–5.1)
Potassium: 3.2 mmol/L — ABNORMAL LOW (ref 3.5–5.1)
Sodium: 134 mmol/L — ABNORMAL LOW (ref 135–145)
Sodium: 139 mmol/L (ref 135–145)

## 2019-07-11 LAB — BPAM FFP
Blood Product Expiration Date: 202107142359
Blood Product Expiration Date: 202107142359
ISSUE DATE / TIME: 202107090140
ISSUE DATE / TIME: 202107090339
Unit Type and Rh: 5100
Unit Type and Rh: 5100

## 2019-07-11 LAB — HEPATIC FUNCTION PANEL
ALT: 2080 U/L — ABNORMAL HIGH (ref 0–44)
AST: 1441 U/L — ABNORMAL HIGH (ref 15–41)
Albumin: 2.4 g/dL — ABNORMAL LOW (ref 3.5–5.0)
Alkaline Phosphatase: 132 U/L — ABNORMAL HIGH (ref 38–126)
Bilirubin, Direct: 0.6 mg/dL — ABNORMAL HIGH (ref 0.0–0.2)
Indirect Bilirubin: 0.9 mg/dL (ref 0.3–0.9)
Total Bilirubin: 1.5 mg/dL — ABNORMAL HIGH (ref 0.3–1.2)
Total Protein: 5.8 g/dL — ABNORMAL LOW (ref 6.5–8.1)

## 2019-07-11 LAB — CBC
HCT: 30.1 % — ABNORMAL LOW (ref 39.0–52.0)
Hemoglobin: 9.6 g/dL — ABNORMAL LOW (ref 13.0–17.0)
MCH: 30.8 pg (ref 26.0–34.0)
MCHC: 31.9 g/dL (ref 30.0–36.0)
MCV: 96.5 fL (ref 80.0–100.0)
Platelets: 292 10*3/uL (ref 150–400)
RBC: 3.12 MIL/uL — ABNORMAL LOW (ref 4.22–5.81)
RDW: 15.1 % (ref 11.5–15.5)
WBC: 13.5 10*3/uL — ABNORMAL HIGH (ref 4.0–10.5)
nRBC: 0.3 % — ABNORMAL HIGH (ref 0.0–0.2)

## 2019-07-11 LAB — COOXEMETRY PANEL
Carboxyhemoglobin: 1.1 % (ref 0.5–1.5)
Methemoglobin: 1.4 % (ref 0.0–1.5)
O2 Saturation: 56.6 %
Total hemoglobin: 10 g/dL — ABNORMAL LOW (ref 12.0–16.0)

## 2019-07-11 LAB — GLUCOSE, CAPILLARY
Glucose-Capillary: 100 mg/dL — ABNORMAL HIGH (ref 70–99)
Glucose-Capillary: 101 mg/dL — ABNORMAL HIGH (ref 70–99)
Glucose-Capillary: 104 mg/dL — ABNORMAL HIGH (ref 70–99)
Glucose-Capillary: 94 mg/dL (ref 70–99)
Glucose-Capillary: 98 mg/dL (ref 70–99)
Glucose-Capillary: 99 mg/dL (ref 70–99)

## 2019-07-11 LAB — PREPARE FRESH FROZEN PLASMA
Unit division: 0
Unit division: 0

## 2019-07-11 LAB — MAGNESIUM: Magnesium: 2.2 mg/dL (ref 1.7–2.4)

## 2019-07-11 LAB — PROCALCITONIN: Procalcitonin: 1.3 ng/mL

## 2019-07-11 LAB — LACTATE DEHYDROGENASE: LDH: 548 U/L — ABNORMAL HIGH (ref 98–192)

## 2019-07-11 LAB — PROTIME-INR
INR: 4.7 (ref 0.8–1.2)
Prothrombin Time: 42.8 seconds — ABNORMAL HIGH (ref 11.4–15.2)

## 2019-07-11 LAB — LIDOCAINE LEVEL: Lidocaine Lvl: 2.8 ug/mL (ref 1.5–5.0)

## 2019-07-11 MED ORDER — POTASSIUM CHLORIDE 10 MEQ/50ML IV SOLN
10.0000 meq | INTRAVENOUS | Status: AC
  Administered 2019-07-11 (×3): 10 meq via INTRAVENOUS
  Filled 2019-07-11 (×3): qty 50

## 2019-07-11 MED ORDER — POTASSIUM CHLORIDE 20 MEQ/15ML (10%) PO SOLN: 20.0000 meq | Freq: Once | ORAL | Status: AC

## 2019-07-11 MED ORDER — POTASSIUM CHLORIDE 20 MEQ/15ML (10%) PO SOLN
40.0000 meq | Freq: Once | ORAL | Status: AC
  Administered 2019-07-11: 40 meq
  Filled 2019-07-11: qty 30

## 2019-07-11 MED ORDER — POTASSIUM CHLORIDE 10 MEQ/50ML IV SOLN: 10.0000 meq | INTRAVENOUS | Status: DC

## 2019-07-11 MED ORDER — FUROSEMIDE 10 MG/ML IJ SOLN
60.0000 mg | Freq: Two times a day (BID) | INTRAMUSCULAR | Status: DC
  Administered 2019-07-11 – 2019-07-12 (×4): 60 mg via INTRAVENOUS
  Filled 2019-07-11 (×4): qty 6

## 2019-07-11 MED ORDER — POTASSIUM CHLORIDE 10 MEQ/50ML IV SOLN
10.0000 meq | INTRAVENOUS | Status: AC
  Administered 2019-07-11 (×4): 10 meq via INTRAVENOUS
  Filled 2019-07-11 (×4): qty 50

## 2019-07-11 NOTE — Progress Notes (Signed)
NAME:  Aaron [name], MRN:  [id number], DOB:  1965-03-27, LOS: 24 ADMISSION DATE:  06/17/2019, CONSULTATION DATE: 07/09/2019 REFERRING MD: Haroldine Laws, CHIEF COMPLAINT: Acute hypoxic respiratory distress  Brief History   54 year old male admitted as a code STEMI  History of present illness   Aaron [name] is a 54 year old male with a past medical history significant for systolic congestive heart failure, ischemic cardiomyopathy, tobacco abuse, and hypertension who presented 6/16 with acute of sudden onset chest pain and abdominal discomfort.  Work-up at outside urgent care revealed STEMI with diffuse ST segment depression and 9/10 chest pain. Patient was transported to [facility] emergency department.   On arrival patient was seen hemodynamically stable.  EKG on arrival consistent with ST depression in inferior lateral leads and Q waves.  Lab work on arrival revealed high-sensitivity troponin of 202, BNP 751, K3.9, glucose 168, creatinine 0.95, WBC 12.5, hemoglobin 16.6, and platelets 177 he received a CT angio to rule out dissection which was negative after which he was urgently taken to Cath Lab for further evaluation.  Cardiac catheterization revealed multivessel CAD, attempt was made to open the LAD but wire would not pass.  At that time intra-aortic balloon pump and Swan scan catheter was placed.   Since admission patient was evaluated by Dr. Julien Girt for possible CABG but unfortunately patient was deemed inappropriate for surgery or percutaneous revascularization due to poor targets.  Therefore, patient was considered for LVAD placement due to severely depressed LV function and little coronary perfusion.   On 06/25/2019 patient had HM III LVAD placed by Dr. Julien Girt under destination therapy criteria due to recent smoking history.  Hospitalization has been complicated by intermittent and frequent V. Fib.  Afternoon of 7/8 patient again went into recurrent V. fib and unfortunately developed  respiratory instability during cardiac arrhythmia resulting in PCCM consultation and intubation.  Patient was given amiodarone bolus and started on drip as well as lidocaine drip.  Post intubation patient was cardioverted at 200 J x 1 with prompt conversion to normal sinus rhythm.  Past Medical History  Systolic congestive heart failure Tobacco abuse Hypertension Ischemic cardiomyopathy  Significant Hospital Events   6/16 admitted  6/24 HM III placed   Consults:  Heart failure Neurology Cardiothoracic surgery Electrophysiology PCCM  Procedures:  7/1 bedside cardioversion due to recurrent V. fib x2 occurrences, converted NSR  7/3 bedside cardioversion due to recurrent V. fib/torsades, converted to NSR 7/6 bedside cardioversion due to recurrent V. fib/torsades, converted to NSR 7/7 bedside cardioversion due to recurrent V. fib x2 occurrences, converted NSR 7/8 intubated by PCCM and underwent bedside cardioversion post intubation, converted to NSR  Significant Diagnostic Tests:  CT angio chest 6/16 > no evidence of aortic aneurysm or dissection, concern for 2.4 cm enhancing lesion within the mid lobe of the right kidney, cannot exclude renal cell carcinoma  CT chest/abdomen/pelvis > severe bilateral pneumonia right greater than left  Abdominal ultrasound 7/8 > minimal dependent sludge within the gallbladder, no other acute findings  Micro Data:  MRSA PCR screening 6/17 > negative Blood culture 7/8>> resp BAL culture 7/8>>  Antimicrobials:  Cefepime 7/8 Vancomycin 7/8  Interim history/subjective:  Twitching movements today, similar to when he ha lidocaine toxicity per RN. No VT overnight. Objective   Blood pressure 118/70, pulse 72, temperature (!) 97.3 F (36.3 C), resp. rate (!) 23, height _0  (1.753 m), weight 97.8 kg, SpO2 98 %. PAP: (22-41)/(14-27) 27/18 CVP:  [0 mmHg-27 mmHg] 17 mmHg CO:  [3.8 L/min-5.6  L/min] 4.2 L/min CI:  [1.8 L/min/m2-2.7 L/min/m2] 2 L/min/m2   Vent Mode: PRVC FiO2 (%):  [60 %] 60 % Set Rate:  [20 bmp] 20 bmp Vt Set:  [570 mL] 570 mL PEEP:  [10 cmH20] 10 cmH20 Plateau Pressure:  [17 cmH20-27 cmH20] 26 cmH20   Intake/Output Summary (Last 24 hours) at 07/11/2019 0981 Last data filed at 07/11/2019 0900 Gross per 24 hour  Intake 3349.96 ml  Output 3325 ml  Net 24.96 ml  net for admission -1.8L  Filed Weights   07/09/19 0600 07/10/19 0500 07/11/19 0500  Weight: 94.1 kg 94 kg 97.8 kg    Examination: General: Critically ill-appearing critically middle-age man, intubated, lightly sedated HEENT: [place]/AT, eyes anicteric.  Oral mucosa moist, ETT and OGT in place. Neuro: RASS -1, moving all extremities on command.  CAM- ICU positive.  Intermittently twitching. CV: Paced rhythm, mechanical heart sounds from the abdomen PULM: CTA B, minimal thin secretions from ETT. GI: Soft, distended, hypoactive bowel sounds.  Similar to previous exams. Extremities: No peripheral edema.  No clubbing or cyanosis.  Good perfusion distal to right radial A-line and right femoral IABP. Skin: No rashes or wounds.  CXR personally reviewed-increased central bilateral opacities.  ET tube 5 cm above the carina.  Cardiomegaly with LVAD in place.   Resolved Hospital Problem list     Assessment & Plan:  Acute hypoxic respiratory failure  -In the setting of tachyarrhythmia due to recurrent V. Tach/  V. fib resulting in decompensation History of sleep apnea Bilateral HAP leading to acute decompensation requiring mechanical ventilation P: -Continue low tidal volume ventilation with goal plateau less than 30 driving pressure 19-14.  Meeting plateau and driving pressure goals.  Titrate PEEP and FiO2 to maintain SPO2 greater than 90%. -VAP prevention protocol -Daily SAT and SBT as appropriate.  Oxygen requirements remain too high for SBT. -Patient will need formal sleep study once stabilized -Sedation is required to maintain vent tolerance.  Continue fentanyl  and Precedex with goal RASS 0 to -1.   Bilateral HAP Sepsis P: -Continue to follow respiratory cultures -Continue broad-spectrum antibiotics to cover resistant organisms -Not requiring vasopressors.  Acute anterior lateral STEMI Multivessel CAD Cardiogenic shock requiring LVAD- HM III  Recurrent VT, likely due to acute sepsis.  Status post multiple DCCV.  Improved since IABP and mechanical ventilation. P: -Continue INO -Continue midodrine -Continue amiodarone and lidocaine per EP. -Optimize electrolytes.  Needs IV rather than enteral repletion.  Small right renal mass, concerning for RCC Incidental finding of 2.5 cm right mid lateral enhancing solid mass incidentally, MRI obtained 6/20 P:  Given acute cardiovascular comorbidities surveillance was preferred -Will need to follow-up with urology at discharge  Tobacco abuse -30 pack/year smoking history P: -Strongly recommend smoking cessation permanently  Elevated LFTs, question shock liver versus congestive hepatopathy versus less likely medication effect.  Improving. P: -Continue to trend -Continue cardiac support devices and inhaled nitric oxide.  Best practice:  Diet: NPO Pain/Anxiety/Delirium protocol (if indicated): PRN fentanyl VAP protocol (if indicated): In place  DVT prophylaxis: Coumadin  GI prophylaxis: PPI Glucose control: SSI Mobility: Bedrest  Code Status: PCB, no CPR Family Communication: Per primary  Disposition: ICU   Labs   CBC: Recent Labs  Lab 07/08/19 0434 07/08/19 0434 07/09/19 0312 07/09/19 1915 07/09/19 2238 07/09/19 2244 07/10/19 0423 07/10/19 0434 07/10/19 1725 07/11/19 0327 07/11/19 0354  WBC 17.7*  --  22.9*  --  25.7*  --   --  19.2*  --  13.5*  --  HGB 10.4*   < > 10.9*   < > 9.9*   < > 9.9* 9.3* 9.2* 9.6* 9.5*  HCT 32.0*   < > 34.1*   < > 30.7*   < > 29.0* 28.8* 27.0* 30.1* 28.0*  MCV 95.8  --  97.2  --  95.9  --   --  96.6  --  96.5  --   PLT 389  --  413*  --  355  --    --  309  --  292  --    < > = values in this interval not displayed.    Basic Metabolic Panel: Recent Labs  Lab 07/08/19 0434 07/08/19 0845 07/08/19 1530 07/09/19 2229 07/09/19 7989 07/09/19 0740 07/09/19 1525 07/09/19 2238 07/09/19 2244 07/10/19 0423 07/10/19 0434 07/10/19 1725 07/11/19 0327 07/11/19 0354  NA 124*   < > 125* 124*   < > 123*   < > 127*   < > 132* 131* 133* 134* 136  K 4.0   < >  --  4.8   < > 4.7   < > 3.9   < > 3.4* 3.3* 3.5 3.7 3.8  CL 90*   < >  --  90*  --  92*  --  94*  --   --  94*  --  103  --   CO2 21*   < >  --  20*  --  18*  --  20*  --   --  25  --  21*  --   GLUCOSE 227*   < >  --  297*  --  277*  --  167*  --   --  148*  --  111*  --   BUN 14   < >  --  17  --  20  --  26*  --   --  27*  --  30*  --   CREATININE 1.01   < >  --  1.32*  --  1.34*  --  1.81*  --   --  1.75*  --  1.59*  --   CALCIUM 8.2*   < >  --  8.0*  --  7.7*  --  7.6*  --   --  7.8*  --  7.9*  --   MG 2.0  --  2.1 2.6*  --   --   --   --   --   --  2.4  --  2.2  --    < > = values in this interval not displayed.   GFR: Estimated Creatinine Clearance: 60.5 mL/min (A) (by C-G formula based on SCr of 1.59 mg/dL (H)). Recent Labs  Lab 07/09/19 0312 07/09/19 2238 07/10/19 0434 07/10/19 1002 07/11/19 0327  PROCALCITON  --   --   --  1.97 1.30  WBC 22.9* 25.7* 19.2*  --  13.5*    Liver Function Tests: Recent Labs  Lab 07/09/19 0740 07/10/19 1002 07/11/19 0327  AST 1,854* 3,004* 1,441*  ALT 1,377* 2,497* 2,080*  ALKPHOS 146* 128* 132*  BILITOT 1.1 1.5* 1.5*  PROT 6.1* 6.1* 5.8*  ALBUMIN 2.5* 2.5* 2.4*   No results for input(s): LIPASE, AMYLASE in the last 168 hours. No results for input(s): AMMONIA in the last 168 hours.  ABG    Component Value Date/Time   PHART 7.447 07/11/2019 0354   PCO2ART 33.3 07/11/2019 0354   PO2ART 140 (H) 07/11/2019 0354   HCO3 23.1 07/11/2019  0354   TCO2 24 07/11/2019 0354   ACIDBASEDEF 1.0 07/11/2019 0354   O2SAT 56.6 07/11/2019  0357     Coagulation Profile: Recent Labs  Lab 07/09/19 1522 07/09/19 2238 07/10/19 0434 07/10/19 2111 07/11/19 0327  INR 3.7* 5.5* 4.1* 4.7* 4.7*    Cardiac Enzymes: No results for input(s): CKTOTAL, CKMB, CKMBINDEX, TROPONINI in the last 168 hours.  HbA1C: Hgb A1c MFr Bld  Date/Time Value Ref Range Status  06/22/2019 11:59 AM 5.4 4.8 - 5.6 % Final    Comment:    (NOTE)         Prediabetes: 5.7 - 6.4         Diabetes: >6.4         Glycemic control for adults with diabetes: <7.0     CBG: Recent Labs  Lab 07/10/19 1527 07/10/19 1927 07/10/19 2351 07/11/19 0340 07/11/19 0733  GLUCAP 85 94 97 101* 94    This patient is critically ill with multiple organ system failure which requires frequent high complexity decision making, assessment, support, evaluation, and titration of therapies. This was completed through the application of advanced monitoring technologies and extensive interpretation of multiple databases. During this encounter critical care time was devoted to patient care services described in this note for 32 minutes.  Julian Hy, DO 07/11/19 10:37 AM [facility]

## 2019-07-11 NOTE — Progress Notes (Addendum)
Drive Line: 08/10/55 0220 Existing VAD dressing removed and site care performed using sterile technique. Drive line site cleaned with Chlora prep applicator x2, allowed to dry, and gauze dressing with silver strip re-applied. Exit site with suture remaining, the velour is fully implanted at the exit site. No tissue ingrowth noted. Moderate amount of redness noted at exit site. Minimal fatty necrosis drainage noted on dressing, no tenderness, foul odor or rash noted. Drive line anchor changed.   Next LVAD dressing change due 07/06/19

## 2019-07-11 NOTE — Progress Notes (Signed)
Patient ID: Aaron [name], male   DOB: 19-Jul-1965, 54 y.o.   MRN: [id number]     Advanced Heart Failure Rounding Note  PCP-Cardiologist: No primary care provider on file.   Subjective:    - HM3 placement 6/24  - Had 2 episodes of VF requiring emergent DC-CV on 07/02/19 - Recurrent VF on 7/2 and broke spontaneously.  - 7/3 Recurrent VF at 2a.-> emergent DC-CV.  - 07/07/2019 VT/Torsades -> repeat DC_CV - 07/08/2019 VF/Torsades x2--> DC-CV x2.  - 07/09/19 developed sepsis/PNA -> intubated - 07/09/19 Multiple episodes of VT/VF -> IABP placed. AV pacing via EPW  Remains on lidocaine 1 mg + amio drip 60 mg per hour. No VT overnight.  Lidocaine level was therapeutic yesterday, pending today.  Has some tremor but unchanged per nurse. A-V sequential pacing.   Off pressors, MAP stable 70s-80s on midodrine 5 mg tid. Remains on IABP 1:2.   Intubated, remains on NO 20.    Afebrile, PCT 1.97 => 1.3.  WBCs 13.5.  He is on vancomycin/cefepime. CXR with atelectasis left base.   LDH 2,605 => 548  AST/ALT elevated, trending down.   Luiz Blare #s done personally.  CVP 17 PA 35/26 Thermo CI 2.2 Co-ox 57%  LVAD Interrogation HM III: Speed: 5200 Flow: 3.7  PI: 6.6 Power: 3.4 Objective:   Weight Range: 97.8 kg Body mass index is 31.84 kg/m.   Vital Signs:   Temp:  [96.8 F (36 C)-98.1 F (36.7 C)] 97.2 F (36.2 C) (07/10 0800) Pulse Rate:  [25-101] 72 (07/10 0800) Resp:  [14-28] 20 (07/10 0800) BP: (113-132)/(64-88) 115/88 (07/10 0800) SpO2:  [95 %-100 %] 100 % (07/10 0800) FiO2 (%):  [60 %] 60 % (07/10 0800) Weight:  [97.8 kg] 97.8 kg (07/10 0500) Last BM Date: 07/09/19  Weight change: Filed Weights   07/09/19 0600 07/10/19 0500 07/11/19 0500  Weight: 94.1 kg 94 kg 97.8 kg    Intake/Output:   Intake/Output Summary (Last 24 hours) at 07/11/2019 0911 Last data filed at 07/11/2019 0800 Gross per 24 hour  Intake 3165.71 ml  Output 3325 ml  Net -159.29 ml      Physical Exam   General:  Intubated/awake.   HEENT: Normal. Neck: Supple, JVP 12+ cm. Carotids OK.  Cardiac:  Mechanical heart sounds with LVAD hum present.  Lungs:  Decreased at bases.  Abdomen:  NT, ND, no HSM. No bruits or masses. +BS  LVAD exit site: Well-healed and incorporated. Dressing dry and intact. No erythema or drainage. Stabilization device present and accurately applied. Driveline dressing changed daily per sterile technique. Extremities:  Warm and dry. No cyanosis, clubbing, rash, or edema.  Neuro:  Awake, follows commands   Telemetry   A-V sequential pacing around 80 Personally reviewed    Labs    CBC Recent Labs    07/10/19 0434 07/10/19 1725 07/11/19 0327 07/11/19 0354  WBC 19.2*  --  13.5*  --   HGB 9.3*   < > 9.6* 9.5*  HCT 28.8*   < > 30.1* 28.0*  MCV 96.6  --  96.5  --   PLT 309  --  292  --    < > = values in this interval not displayed.   Basic Metabolic Panel Recent Labs    07/10/19 0434 07/10/19 1725 07/11/19 0327 07/11/19 0354  NA 131*   < > 134* 136  K 3.3*   < > 3.7 3.8  CL 94*  --  103  --   CO2  25  --  21*  --   GLUCOSE 148*  --  111*  --   BUN 27*  --  30*  --   CREATININE 1.75*  --  1.59*  --   CALCIUM 7.8*  --  7.9*  --   MG 2.4  --  2.2  --    < > = values in this interval not displayed.   Liver Function Tests Recent Labs    07/10/19 1002 07/11/19 0327  AST 3,004* 1,441*  ALT 2,497* 2,080*  ALKPHOS 128* 132*  BILITOT 1.5* 1.5*  PROT 6.1* 5.8*  ALBUMIN 2.5* 2.4*   No results for input(s): LIPASE, AMYLASE in the last 72 hours. Cardiac Enzymes No results for input(s): CKTOTAL, CKMB, CKMBINDEX, TROPONINI in the last 72 hours.  BNP: BNP (last 3 results) Recent Labs    06/26/19 0445 07/02/19 0531 07/09/19 0313  BNP 1,301.1* 1,646.4* 1,815.2*    ProBNP (last 3 results) No results for input(s): PROBNP in the last 8760 hours.   D-Dimer No results for input(s): DDIMER in the last 72 hours. Hemoglobin A1C No results for input(s):  HGBA1C in the last 72 hours. Fasting Lipid Panel No results for input(s): CHOL, HDL, LDLCALC, TRIG, CHOLHDL, LDLDIRECT in the last 72 hours. Thyroid Function Tests No results for input(s): TSH, T4TOTAL, T3FREE, THYROIDAB in the last 72 hours.  Invalid input(s): FREET3  Other results:   Imaging    DG CHEST PORT 1 VIEW  Result Date: 07/11/2019 CLINICAL DATA:  Reason for exam: LVAD (left ventricular assist device) present in IP EXAM: PORTABLE CHEST 1 VIEW COMPARISON:  Chest x-ray dated 07/10/2019. FINDINGS: Swan-Ganz catheter is stable in position with tip to the RIGHT of midline. Enteric tube passes below the diaphragm. Endotracheal tube is well positioned with tip above the level of the carina. LEFT ventricular assist device is stable in position. Stable cardiomegaly. Lungs are clear. No pneumothorax is seen. IMPRESSION: 1. Stable chest x-ray. Probable atelectasis and/or small pleural effusion at the LEFT lung base. Stable cardiomegaly. 2. Support apparatus appears stable in position. Electronically Signed   By: Franki Cabot M.D.   On: 07/11/2019 08:51     Medications:     Scheduled Medications: . aspirin  81 mg Per Tube Daily  . atorvastatin  80 mg Per Tube Daily  . bisacodyl  10 mg Oral Daily   Or  . bisacodyl  10 mg Rectal Daily  . chlorhexidine gluconate (MEDLINE KIT)  15 mL Mouth Rinse BID  . Chlorhexidine Gluconate Cloth  6 each Topical Daily  . clonazePAM  0.5 mg Per Tube BID  . colchicine  0.6 mg Per Tube BID  . digoxin  0.125 mg Per Tube Daily  . docusate  200 mg Per Tube Daily  . folic acid  1 mg Per Tube Daily  . furosemide  60 mg Intravenous BID  . gabapentin  300 mg Per Tube TID  . insulin aspart  0-15 Units Subcutaneous Q4H  . mouth rinse  15 mL Mouth Rinse 10 times per day  . midodrine  5 mg Per Tube TID WC  . multivitamin with minerals  1 tablet Per Tube Daily  . pantoprazole sodium  40 mg Per Tube Daily  . sodium chloride flush  10-40 mL Intracatheter Q12H    . sodium chloride flush  3 mL Intravenous Q12H  . sodium chloride flush  3 mL Intravenous Q12H  . thiamine  100 mg Per Tube Daily    Infusions: .  sodium chloride    . sodium chloride 10 mL/hr at 07/11/19 0800  . sodium chloride    . amiodarone 60 mg/hr (07/11/19 0800)  . amiodarone    . ceFEPime (MAXIPIME) IV 2 g (07/11/19 0809)  . dexmedetomidine (PRECEDEX) IV infusion 0.3 mcg/kg/hr (07/11/19 0800)  . fentaNYL infusion INTRAVENOUS 150 mcg/hr (07/11/19 0800)  . lactated ringers    . lidocaine 1 mg/min (07/11/19 0800)  . vancomycin Stopped (07/10/19 2224)  . vasopressin Stopped (07/10/19 1056)    PRN Medications: Place/Maintain arterial line **AND** sodium chloride, sodium chloride, acetaminophen, ALPRAZolam, bisacodyl, calcium carbonate, dextrose, fentaNYL (SUBLIMAZE) injection, ondansetron (ZOFRAN) IV, oxyCODONE, pneumococcal 23 valent vaccine, polyvinyl alcohol, sodium chloride flush, sodium chloride flush, traMADol, traZODone    Patient Profile   Darius Lundberg Egle --post MI cardiogenic shock/ optimization prior to anticipated CABG, at the request of Dr. Marlou Porch, Cardiology.    Assessment/Plan   1. Refractory VT/VF  - developed VF on 7/1 x 2 requiring emergent DC-CV - On 7/2 had self-terminating episode - On 7/3 persistent VF/torsades -> DC-CV - On 7/6  VF/torsades.  - On 7/7 VF /Torsades on 2 separate occasions-> DC-CV x2.  - On 7/8 multiple episodes on VT/VF (incessant) -> unclear which intervention broke it - Remains on lidocaine 1/amiodarone 60. He is twitching mildly but lido level 2.6. Will try to continue through weekend per Dr. Haroldine Laws.  - Continue IABP through weekend per Dr. Haroldine Laws.  - keep K> 4.0 Mg >2.0, repeat BMET in pm.  - VAD speed initially reduced to 5000 but output low. Now at 5200 (unable to go higher due to suction events) - Keep off inotropes if possible with VT.    2. Acute Anterolateral STEMI/ Multivessel CAD - Emergent cath showed severe  multivessel disease w/ 75% mid- distal LM, 100% prox LAD, not amendable to PCI (unable to cross w/ guide wire, probable CTO), 100% prox LCx (probable CTO) and 100% ostial RCA (probable CTO). There were mild to moderate collaterals right to left to the LAD, minimal right to right collaterals an minimal left to left collaterals. LVEF 25% - Hs trop peaked 27,000. - No targets for CABG / PCI.   - S/P HMIII VAD - suspect VF is ischemically-mediated   3.  Acute Systolic Heart Failure --->Cardiogenic Shock - 2/2 acute MI. LVEF by LVG 25% - IABP out 6/18 but replaced 6/23. Removed in OR on 6/24  - HMIII LVAD  Placed 6/24 - Extubated 6/25  - Was on milrinone for RV failure. Now off due to VF.  - Co-ox 57% with IABP 1:2 => as above, plan to continue IABP through weekend. - On midodrine 5 mg tid, MAP stable.  - Continue digoxin.  - Continue NO at 20 ppm. - Volume overloaded with CVP 17-18 today, will give Lasix 60 mg IV bid and follow K closely.  Creatinine 1.59 today.  - INR 4.7, has had vitamin K and warfarin held. Start heparin at 500u/hr for IABP once INR < 3.0 - LDH 490-->1752 -> 2600 -> 548 improving, suspect rise due to septic shock. No evidence pump thrombosis  - Ramp echo 7/8 RV only mildly reduced. Good cannula position. Increase VAD speed as tolerated, currently 5200.  - VAD interrogated personally.   4. Acute hypoxic respiratory failure - intubated 7/8 - Chest CT with bilateral PNA R>L - CCM following - continue vanc/cefipime (started 7/8) - PCT 1.97 => 1.3.  - BCx and BAL NGTD  5. Shock liver - LFTs starting  to trend down.  - follow  6. Tobacco Abuse - 30 pack years  - smoking cessation advised  - Will need to quit if to be considered for heart transplant in the future   7. Renal mass = RCC - MRI abdomen- 1.8 cm right renal lesion, most consistent with renal cell carcinoma. No evidence of renal vein involvement or nodal metastasis. - Discussed w/ Urology and R RCC is  small and will just require surveillance.  Ok to proceed with VAD from their standpoint.   8. Hyponatremia  -improving  9. Sleep Apnea - frequent apnea at night  - needs formal sleep study   - CPAP ordered while inpatient  10. Anemia - Post-op, stable.  - INR elevated, has had vitamin K, follow for now off warfarin.     CRITICAL CARE Performed by: Loralie Champagne  Total critical care time: 40 minutes  Critical care time was exclusive of separately billable procedures and treating other patients.  Critical care was necessary to treat or prevent imminent or life-threatening deterioration.  Critical care was time spent personally by me (independent of midlevel providers or residents) on the following activities: development of treatment plan with patient and/or surrogate as well as nursing, discussions with consultants, evaluation of patient's response to treatment, examination of patient, obtaining history from patient or surrogate, ordering and performing treatments and interventions, ordering and review of laboratory studies, ordering and review of radiographic studies, pulse oximetry and re-evaluation of patient's condition.   Loralie Champagne, MD  9:11 AM

## 2019-07-11 NOTE — Progress Notes (Signed)
[facility] for Coumadin>>heparin when INR ~ 3 Indication: LVAD  Allergies  Allergen Reactions  . Codeine Hives    Patient Measurements: Height: 5\' 9"  (175.3 cm) Weight: 97.8 kg (215 lb 9.8 oz) IBW/kg (Calculated) : 70.7 Heparin Dosing Weight: ~ 92 kg  Vital Signs: Temp: 97.7 F (36.5 C) (07/10 1100) Temp Source: Core (07/10 0800) BP: 122/83 (07/10 1100) Pulse Rate: 75 (07/10 1100)  Labs: Recent Labs     0000 07/09/19 0740 07/09/19 0920 07/09/19 1522 07/09/19 2238 07/09/19 2244 07/10/19 0434 07/10/19 0434 07/10/19 1725 07/10/19 1725 07/10/19 2111 07/11/19 0327 07/11/19 0354  HGB  --   --   --    < > 9.9*   < > 9.3*   < > 9.2*   < >  --  9.6* 9.5*  HCT  --   --   --    < > 30.7*   < > 28.8*   < > 27.0*  --   --  30.1* 28.0*  PLT   < >  --   --   --  355  --  309  --   --   --   --  292  --   LABPROT  --   --   --    < > 48.5*   < > 38.4*  --   --   --  43.0* 42.8*  --   INR  --   --   --    < > 5.5*   < > 4.1*  --   --   --  4.7* 4.7*  --   CREATININE   < > 1.34*  --   --  1.81*  --  1.75*  --   --   --   --  1.59*  --   TROPONINIHS  --  3,790* 3,481*  --   --   --   --   --   --   --   --   --   --    < > = values in this interval not displayed.    Estimated Creatinine Clearance: 60.5 mL/min (A) (by C-G formula based on SCr of 1.59 mg/dL (H)).  Assessment: 54 yo male s/p LVAD HM3 placement 6/24.  Pharmacy asked to dose  anticoagulation with IV heparin and Coumadin. No overt bleeding or complications noted.    Was intubated and now also with IABP - off heparin given elevated INR.   INR remains 4.7 today despite small vitamin K dose last night.  LDH trending back down today.  Goal of Therapy:  Heparin level: 0.2-0.5 - currently dosed per discussion with MD INR 2-2.5  Monitor platelets by anticoagulation protocol: Yes   Plan:  -Pending heparin start when INR 3 or less, will start fixed rate heparin infusion at 500  units/hr.  No heparin for now given elevated INR. -Daily INR, CBC, LDH  [place] Crane, Roylene Reason, [facility] Clinical Pharmacist  07/11/2019 11:33 AM   [facility] pharmacy phone numbers are listed on [email or web address]

## 2019-07-11 NOTE — Progress Notes (Signed)
Existing VAD dressing removed and site care performed using sterile technique by this RN, with supervision of Warden Fillers, CN. Drive line site cleaned with Chlora prep applicator x2, allowed to dry, and gauze dressing with silver strip re-applied. Exit site with suture remaining. No tissue ingrowth noted.Small amount of redness noted at exit site. Minimal fatty necrosis drainage noted on dressing and at exit site, no tenderness, foul odor or rash noted. (Photo taken and sent to Dr. Aundra Dubin.) Drive line anchor replaced. Continue daily dressing changes. Next dressing change due 07/12/19.

## 2019-07-12 ENCOUNTER — Inpatient Hospital Stay (HOSPITAL_COMMUNITY): Payer: BC Managed Care – PPO

## 2019-07-12 DIAGNOSIS — R7401 Elevation of levels of liver transaminase levels: Secondary | ICD-10-CM

## 2019-07-12 LAB — COOXEMETRY PANEL
Carboxyhemoglobin: 1.2 % (ref 0.5–1.5)
Methemoglobin: 1.4 % (ref 0.0–1.5)
O2 Saturation: 63.8 %
Total hemoglobin: 10.1 g/dL — ABNORMAL LOW (ref 12.0–16.0)

## 2019-07-12 LAB — CBC
HCT: 31.4 % — ABNORMAL LOW (ref 39.0–52.0)
Hemoglobin: 10 g/dL — ABNORMAL LOW (ref 13.0–17.0)
MCH: 31.2 pg (ref 26.0–34.0)
MCHC: 31.8 g/dL (ref 30.0–36.0)
MCV: 97.8 fL (ref 80.0–100.0)
Platelets: 282 10*3/uL (ref 150–400)
RBC: 3.21 MIL/uL — ABNORMAL LOW (ref 4.22–5.81)
RDW: 15.6 % — ABNORMAL HIGH (ref 11.5–15.5)
WBC: 12.3 10*3/uL — ABNORMAL HIGH (ref 4.0–10.5)
nRBC: 0.6 % — ABNORMAL HIGH (ref 0.0–0.2)

## 2019-07-12 LAB — POCT I-STAT 7, (LYTES, BLD GAS, ICA,H+H)
Acid-base deficit: 3 mmol/L — ABNORMAL HIGH (ref 0.0–2.0)
Bicarbonate: 20.3 mmol/L (ref 20.0–28.0)
Calcium, Ion: 1.19 mmol/L (ref 1.15–1.40)
HCT: 28 % — ABNORMAL LOW (ref 39.0–52.0)
Hemoglobin: 9.5 g/dL — ABNORMAL LOW (ref 13.0–17.0)
O2 Saturation: 100 %
Patient temperature: 37.7
Potassium: 3.8 mmol/L (ref 3.5–5.1)
Sodium: 143 mmol/L (ref 135–145)
TCO2: 21 mmol/L — ABNORMAL LOW (ref 22–32)
pCO2 arterial: 30.9 mmHg — ABNORMAL LOW (ref 32.0–48.0)
pH, Arterial: 7.428 (ref 7.350–7.450)
pO2, Arterial: 163 mmHg — ABNORMAL HIGH (ref 83.0–108.0)

## 2019-07-12 LAB — CULTURE, RESPIRATORY W GRAM STAIN
Culture: NORMAL
Culture: NORMAL

## 2019-07-12 LAB — BASIC METABOLIC PANEL
Anion gap: 12 (ref 5–15)
Anion gap: 11 (ref 5–15)
BUN: 28 mg/dL — ABNORMAL HIGH (ref 6–20)
BUN: 28 mg/dL — ABNORMAL HIGH (ref 6–20)
CO2: 18 mmol/L — ABNORMAL LOW (ref 22–32)
CO2: 19 mmol/L — ABNORMAL LOW (ref 22–32)
Calcium: 8.1 mg/dL — ABNORMAL LOW (ref 8.9–10.3)
Calcium: 8.1 mg/dL — ABNORMAL LOW (ref 8.9–10.3)
Chloride: 111 mmol/L (ref 98–111)
Chloride: 112 mmol/L — ABNORMAL HIGH (ref 98–111)
Creatinine, Ser: 1.44 mg/dL — ABNORMAL HIGH (ref 0.61–1.24)
Creatinine, Ser: 1.4 mg/dL — ABNORMAL HIGH (ref 0.61–1.24)
GFR calc Af Amer: >60 mL/min (ref >60)
GFR calc Af Amer: >60 mL/min (ref >60)
GFR calc non Af Amer: 54 mL/min — ABNORMAL LOW (ref >60)
GFR calc non Af Amer: 56 mL/min — ABNORMAL LOW (ref >60)
Glucose, Bld: 118 mg/dL — ABNORMAL HIGH (ref 70–99)
Glucose, Bld: 129 mg/dL — ABNORMAL HIGH (ref 70–99)
Potassium: 3.7 mmol/L (ref 3.5–5.1)
Potassium: 4 mmol/L (ref 3.5–5.1)
Sodium: 141 mmol/L (ref 135–145)
Sodium: 142 mmol/L (ref 135–145)

## 2019-07-12 LAB — HEPATIC FUNCTION PANEL
ALT: 1554 U/L — ABNORMAL HIGH (ref 0–44)
AST: 686 U/L — ABNORMAL HIGH (ref 15–41)
Albumin: 2.2 g/dL — ABNORMAL LOW (ref 3.5–5.0)
Alkaline Phosphatase: 134 U/L — ABNORMAL HIGH (ref 38–126)
Bilirubin, Direct: 0.6 mg/dL — ABNORMAL HIGH (ref 0.0–0.2)
Indirect Bilirubin: 0.8 mg/dL (ref 0.3–0.9)
Total Bilirubin: 1.4 mg/dL — ABNORMAL HIGH (ref 0.3–1.2)
Total Protein: 5.9 g/dL — ABNORMAL LOW (ref 6.5–8.1)

## 2019-07-12 LAB — GLUCOSE, CAPILLARY
Glucose-Capillary: 117 mg/dL — ABNORMAL HIGH (ref 70–99)
Glucose-Capillary: 97 mg/dL (ref 70–99)
Glucose-Capillary: 101 mg/dL — ABNORMAL HIGH (ref 70–99)
Glucose-Capillary: 121 mg/dL — ABNORMAL HIGH (ref 70–99)
Glucose-Capillary: 121 mg/dL — ABNORMAL HIGH (ref 70–99)

## 2019-07-12 LAB — LACTATE DEHYDROGENASE: LDH: 447 U/L — ABNORMAL HIGH (ref 98–192)

## 2019-07-12 LAB — PHOSPHORUS
Phosphorus: 3.3 mg/dL (ref 2.5–4.6)
Phosphorus: 3.4 mg/dL (ref 2.5–4.6)

## 2019-07-12 LAB — PROTIME-INR
INR: 4.2 (ref 0.8–1.2)
Prothrombin Time: 39.5 seconds — ABNORMAL HIGH (ref 11.4–15.2)

## 2019-07-12 LAB — MAGNESIUM: Magnesium: 2.3 mg/dL (ref 1.7–2.4)

## 2019-07-12 LAB — PROCALCITONIN: Procalcitonin: 0.6 ng/mL

## 2019-07-12 MED ORDER — PROSOURCE TF PO LIQD
45.0000 mL | Freq: Two times a day (BID) | ORAL | Status: DC
  Administered 2019-07-12 – 2019-07-14 (×5): 45 mL
  Filled 2019-07-12 (×7): qty 45

## 2019-07-12 MED ORDER — VITAL HIGH PROTEIN PO LIQD
1000.0000 mL | ORAL | Status: DC
  Administered 2019-07-12 – 2019-07-14 (×3): 1000 mL

## 2019-07-12 MED ORDER — POTASSIUM CHLORIDE 10 MEQ/50ML IV SOLN
10.0000 meq | INTRAVENOUS | Status: AC
  Administered 2019-07-12 (×3): 10 meq via INTRAVENOUS
  Filled 2019-07-12 (×3): qty 50

## 2019-07-12 MED ORDER — SORBITOL 70 % SOLN
960.0000 mL | TOPICAL_OIL | Freq: Once | ORAL | Status: AC
  Administered 2019-07-12: 960 mL via RECTAL
  Filled 2019-07-12: qty 473

## 2019-07-12 MED ORDER — POTASSIUM CHLORIDE 20 MEQ/15ML (10%) PO SOLN
40.0000 meq | Freq: Once | ORAL | Status: AC
  Administered 2019-07-12: 40 meq
  Filled 2019-07-12: qty 30

## 2019-07-12 MED ORDER — MELATONIN 5 MG PO TABS
5.0000 mg | ORAL_TABLET | Freq: Every day | ORAL | Status: DC
  Administered 2019-07-12 – 2019-07-13 (×2): 5 mg
  Filled 2019-07-12 (×2): qty 1

## 2019-07-12 MED ORDER — MEXILETINE HCL 150 MG PO CAPS
150.0000 mg | ORAL_CAPSULE | Freq: Two times a day (BID) | ORAL | Status: DC
  Administered 2019-07-12 – 2019-07-14 (×6): 150 mg
  Filled 2019-07-12 (×6): qty 1

## 2019-07-12 NOTE — Progress Notes (Signed)
Drive BVAP:0/14/10 3013  Existing VAD dressing removed and site care performed using sterile technique. Drive line site cleaned with Chlora prep applicator x2, allowed to dry, and gauze dressing with silver strip re-applied. Exit site with suture remaining. No tissue ingrowth noted.Small amount of redness noted at exit site. Minimal fatty necrosis drainage noted on dressing and at exit site, no tenderness, foul odor or rash noted. Drive line anchor changed.Marland Kitchen  Next LVAD dressing change due7/12/21

## 2019-07-12 NOTE — Progress Notes (Signed)
NAME:  Aaron [name], MRN:  [id number], DOB:  03-13-1965, LOS: 89 ADMISSION DATE:  06/17/2019, CONSULTATION DATE: 07/09/2019 REFERRING MD: Haroldine Laws, CHIEF COMPLAINT: Acute hypoxic respiratory distress  Brief History   54 year old male admitted as a code STEMI  History of present illness   Aaron [name] is a 54 year old male with a past medical history significant for systolic congestive heart failure, ischemic cardiomyopathy, tobacco abuse, and hypertension who presented 6/16 with acute of sudden onset chest pain and abdominal discomfort.  Work-up at outside urgent care revealed STEMI with diffuse ST segment depression and 9/10 chest pain. Patient was transported to [facility] emergency department.   On arrival patient was seen hemodynamically stable.  EKG on arrival consistent with ST depression in inferior lateral leads and Q waves.  Lab work on arrival revealed high-sensitivity troponin of 202, BNP 751, K3.9, glucose 168, creatinine 0.95, WBC 12.5, hemoglobin 16.6, and platelets 177 he received a CT angio to rule out dissection which was negative after which he was urgently taken to Cath Lab for further evaluation.  Cardiac catheterization revealed multivessel CAD, attempt was made to open the LAD but wire would not pass.  At that time intra-aortic balloon pump and Swan scan catheter was placed.   Since admission patient was evaluated by Dr. Julien Girt for possible CABG but unfortunately patient was deemed inappropriate for surgery or percutaneous revascularization due to poor targets.  Therefore, patient was considered for LVAD placement due to severely depressed LV function and little coronary perfusion.   On 06/25/2019 patient had HM III LVAD placed by Dr. Julien Girt under destination therapy criteria due to recent smoking history.  Hospitalization has been complicated by intermittent and frequent V. Fib.  Afternoon of 7/8 patient again went into recurrent V. fib and unfortunately developed  respiratory instability during cardiac arrhythmia resulting in PCCM consultation and intubation.  Patient was given amiodarone bolus and started on drip as well as lidocaine drip.  Post intubation patient was cardioverted at 200 J x 1 with prompt conversion to normal sinus rhythm.  Past Medical History  Systolic congestive heart failure Tobacco abuse Hypertension Ischemic cardiomyopathy  Significant Hospital Events   6/16 admitted  6/24 HM III placed   Consults:  Heart failure Neurology Cardiothoracic surgery Electrophysiology PCCM  Procedures:  7/1 bedside cardioversion due to recurrent V. fib x2 occurrences, converted NSR  7/3 bedside cardioversion due to recurrent V. fib/torsades, converted to NSR 7/6 bedside cardioversion due to recurrent V. fib/torsades, converted to NSR 7/7 bedside cardioversion due to recurrent V. fib x2 occurrences, converted NSR 7/8 intubated by PCCM and underwent bedside cardioversion post intubation, converted to NSR  Significant Diagnostic Tests:  CT angio chest 6/16 > no evidence of aortic aneurysm or dissection, concern for 2.4 cm enhancing lesion within the mid lobe of the right kidney, cannot exclude renal cell carcinoma  CT chest/abdomen/pelvis > severe bilateral pneumonia right greater than left  Abdominal ultrasound 7/8 > minimal dependent sludge within the gallbladder, no other acute findings  Micro Data:  MRSA PCR screening 6/17 > negative Blood culture 7/8>> resp BAL culture 7/8>>  Antimicrobials:  Cefepime 7/8 Vancomycin 7/8  Interim history/subjective:  Less confused today.  He denies complaints.  Objective   Blood pressure 119/67, pulse (!) 32, temperature 98.3 F (36.8 C), temperature source Oral, resp. rate 18, height _0  (1.753 m), weight 95.9 kg, SpO2 100 %. PAP: (23-37)/(15-24) 26/17 CVP:  [11 mmHg-18 mmHg] 12 mmHg CO:  [4 L/min] 4 L/min CI:  [1.9  L/min/m2] 1.9 L/min/m2  Vent Mode: PRVC FiO2 (%):  [50 %-60 %] 50  % Set Rate:  [20 bmp] 20 bmp Vt Set:  [570 mL] 570 mL PEEP:  [10 cmH20] 10 cmH20 Plateau Pressure:  [23 cmH20-26 cmH20] 24 cmH20   Intake/Output Summary (Last 24 hours) at 07/12/2019 0810 Last data filed at 07/12/2019 0600 Gross per 24 hour  Intake 3563.88 ml  Output 6600 ml  Net -3036.12 ml  net for admission -1.8L  Filed Weights   07/10/19 0500 07/11/19 0500 07/12/19 0500  Weight: 94 kg 97.8 kg 95.9 kg    Examination: General: Critically ill appearing man lying in bed intubated, minimally sedated. HEENT: [place]/AT, eyes anicteric.  ETT and OGT Neuro: RASS equals 0, moving all extremities on command, CAM- ICU negative. CV: Paced rhythm, mechanical VAD heart sounds  PULM: CTA B, minimal thin secretions from ET tube. GI: Soft, persistently distended.  Similar to previous exams.  Nontender palpation. Extremities: No peripheral edema.  No cyanosis.  Right radial A-line, right femoral balloon pump. Skin: No rashes or wounds.  CXR personally reviewed- improving bilateral airspace disease.  Support devices remain unchanged.   Resolved Hospital Problem list     Assessment & Plan:  Acute hypoxic respiratory failure  -In the setting of tachyarrhythmia due to recurrent V. Tach/  V. fib resulting in decompensation History of sleep apnea Bilateral HAP leading to acute decompensation requiring mechanical ventilation P: -Continue low tidal volume ventilation with goal plateau less than 30 driving pressure 78-24.  P plat 24, driving pressure 14.  Titrate PEEP and FiO2 to maintain SpO2 greater than 90%. -VAP prevention protocol -Daily SAT and SBT as appropriate.  Need for ongoing diuresis and titration of INO preclude extubation. -Patient will need formal sleep study once discharged -Sedation is required to maintain vent tolerance.  Continue fentanyl and Precedex with goal RASS 0 to -1.   Bilateral HAP Sepsis P: -Continue to follow respiratory cultures -Continue broad-spectrum  antibiotics to cover resistant organisms.  Likely can safely discontinue vancomycin.  Acute anterior lateral STEMI Multivessel CAD Cardiogenic shock requiring LVAD- HM III  Recurrent VT, likely due to acute sepsis.  Status post multiple DCCV.  Improved since IABP and mechanical ventilation. P: -Continue INO per to CTS -Continue midodrine -Continue amiodarone and lidocaine per EP. -Optimize electrolytes.  Needs IV rather than enteral repletion due to poor absorption. -Continue diuresis  Small right renal mass, concerning for RCC Incidental finding of 2.5 cm right mid lateral enhancing solid mass incidentally, MRI obtained 6/20 P:  Given acute cardiovascular comorbidities surveillance was preferred -Will need to follow-up with urology at discharge  Tobacco abuse -30 pack/year smoking history P: -Strongly recommend smoking cessation permanently  Elevated LFTs due to cardiac decompensation- improving. P: -Continue to trend -Continue cardiac support devices and inhaled nitric oxide.  Constipation -Escalating bowel regimen -May need enema if no response today.  Best practice:  Diet: NPO Pain/Anxiety/Delirium protocol (if indicated): PRN fentanyl VAP protocol (if indicated): In place  DVT prophylaxis: Coumadin  GI prophylaxis: PPI Glucose control: SSI Mobility: Bedrest  Code Status: PCB, no CPR Family Communication: Per primary  Disposition: ICU   Labs   CBC: Recent Labs  Lab 07/09/19 0312 07/09/19 1915 07/09/19 2238 07/09/19 2244 07/10/19 0434 07/10/19 1725 07/11/19 0327 07/11/19 0327 07/11/19 0354 07/11/19 1703 07/11/19 2131 07/12/19 0342 07/12/19 0359  WBC 22.9*  --  25.7*  --  19.2*  --  13.5*  --   --   --   --  12.3*  --   HGB 10.9*   < > 9.9*   < > 9.3*   < > 9.6*   < > 9.5* 10.2* 9.5* 10.0* 9.5*  HCT 34.1*   < > 30.7*   < > 28.8*   < > 30.1*   < > 28.0* 30.0* 28.0* 31.4* 28.0*  MCV 97.2  --  95.9  --  96.6  --  96.5  --   --   --   --  97.8  --   PLT  413*  --  355  --  309  --  292  --   --   --   --  282  --    < > = values in this interval not displayed.    Basic Metabolic Panel: Recent Labs  Lab 07/08/19 0845 07/08/19 1530 07/09/19 9983 07/09/19 0740 07/09/19 2238 07/09/19 2244 07/10/19 0434 07/10/19 1725 07/11/19 0327 07/11/19 0354 07/11/19 1523 07/11/19 1703 07/11/19 2131 07/12/19 0342 07/12/19 0359  NA   < > 125* 124*   < > 127*   < > 131*   < > 134*   < > 139 142 142 141 143  K   < >  --  4.8   < > 3.9   < > 3.3*   < > 3.7   < > 3.2* 3.4* 3.8 3.7 3.8  CL   < >  --  90*   < > 94*  --  94*  --  103  --  107  --   --  111  --   CO2   < >  --  20*   < > 20*  --  25  --  21*  --  20*  --   --  18*  --   GLUCOSE   < >  --  297*   < > 167*  --  148*  --  111*  --  107*  --   --  118*  --   BUN   < >  --  17   < > 26*  --  27*  --  30*  --  28*  --   --  28*  --   CREATININE   < >  --  1.32*   < > 1.81*  --  1.75*  --  1.59*  --  1.50*  --   --  1.44*  --   CALCIUM   < >  --  8.0*   < > 7.6*  --  7.8*  --  7.9*  --  8.1*  --   --  8.1*  --   MG  --  2.1 2.6*  --   --   --  2.4  --  2.2  --   --   --   --  2.3  --    < > = values in this interval not displayed.   GFR: Estimated Creatinine Clearance: 66.2 mL/min (A) (by C-G formula based on SCr of 1.44 mg/dL (H)). Recent Labs  Lab 07/09/19 2238 07/10/19 0434 07/10/19 1002 07/11/19 0327 07/12/19 0342  PROCALCITON  --   --  1.97 1.30 0.60  WBC 25.7* 19.2*  --  13.5* 12.3*    Liver Function Tests: Recent Labs  Lab 07/09/19 0740 07/10/19 1002 07/11/19 0327 07/12/19 0342  AST 1,854* 3,004* 1,441* 686*  ALT 1,377* 2,497* 2,080* 1,554*  ALKPHOS 146* 128* 132* 134*  BILITOT 1.1  1.5* 1.5* 1.4*  PROT 6.1* 6.1* 5.8* 5.9*  ALBUMIN 2.5* 2.5* 2.4* 2.2*   No results for input(s): LIPASE, AMYLASE in the last 168 hours. No results for input(s): AMMONIA in the last 168 hours.  ABG    Component Value Date/Time   PHART 7.428 07/12/2019 0359   PCO2ART 30.9 (L)  07/12/2019 0359   PO2ART 163 (H) 07/12/2019 0359   HCO3 20.3 07/12/2019 0359   TCO2 21 (L) 07/12/2019 0359   ACIDBASEDEF 3.0 (H) 07/12/2019 0359   O2SAT 63.8 07/12/2019 0404     Coagulation Profile: Recent Labs  Lab 07/09/19 2238 07/10/19 0434 07/10/19 2111 07/11/19 0327 07/12/19 0342  INR 5.5* 4.1* 4.7* 4.7* 4.2*    Cardiac Enzymes: No results for input(s): CKTOTAL, CKMB, CKMBINDEX, TROPONINI in the last 168 hours.  HbA1C: Hgb A1c MFr Bld  Date/Time Value Ref Range Status  06/22/2019 11:59 AM 5.4 4.8 - 5.6 % Final    Comment:    (NOTE)         Prediabetes: 5.7 - 6.4         Diabetes: >6.4         Glycemic control for adults with diabetes: <7.0     CBG: Recent Labs  Lab 07/11/19 1613 07/11/19 2004 07/11/19 2335 07/12/19 0414 07/12/19 0747  GLUCAP 104* 100* 99 117* 97    This patient is critically ill with multiple organ system failure which requires frequent high complexity decision making, assessment, support, evaluation, and titration of therapies. This was completed through the application of advanced monitoring technologies and extensive interpretation of multiple databases. During this encounter critical care time was devoted to patient care services described in this note for 32 minutes.  Julian Hy, DO 07/12/19 8:10 AM [facility]

## 2019-07-12 NOTE — Progress Notes (Signed)
Pharmacy Antibiotic Note  Aaron [name] is a 54 y.o. male admitted on 06/17/2019 with sepsis.  Pharmacy has been consulted for vancomycin and cefepime dosing for WBC increased and cardiogenic/septic shock, now with bilateral HAP requiring mechanical ventilation.   Cultures remain negative  Plan: Per discussion with Drs. McLean and Amador Pines, okay to stop vancomycin today. Continue Cefepime 2g every 8 hours F/u cultures, renal function and clinical course.  Monitor culture results, renal function, and vancomycin levels as indicated  Height: _0  (175.3 cm) Weight: 95.9 kg (211 lb 6.7 oz) IBW/kg (Calculated) : 70.7  Temp (24hrs), Avg:99.1 F (37.3 C), Min:90 F (32.2 C), Max:99.9 F (37.7 C)  Recent Labs  Lab 07/09/19 0312 07/09/19 0740 07/09/19 2238 07/10/19 0434 07/11/19 0327 07/11/19 1523 07/12/19 0342  WBC 22.9*  --  25.7* 19.2* 13.5*  --  12.3*  CREATININE 1.32*   < > 1.81* 1.75* 1.59* 1.50* 1.44*   < > = values in this interval not displayed.    Estimated Creatinine Clearance: 66.2 mL/min (A) (by C-G formula based on SCr of 1.44 mg/dL (H)).    Allergies  Allergen Reactions  . Codeine Hives    Antimicrobials this admission: Vancomycin 07/09/19 >> 7/11 Cefepime 07/09/19 >>   Dose adjustments this admission: N/A  Microbiology results: Bcx 7/8: neg BAL 7/8: normal flora  Thank you for allowing pharmacy to be a part of this patient's care.  [place] Crane, Roylene Reason, BCCP Clinical Pharmacist  07/12/2019 1:19 PM   [facility] pharmacy phone numbers are listed on [email or web address]

## 2019-07-12 NOTE — Progress Notes (Signed)
[place] for Coumadin>>heparin when INR ~ 3 Indication: LVAD  Allergies  Allergen Reactions  . Codeine Hives    Patient Measurements: Height: 5\' 9"  (175.3 cm) Weight: 95.9 kg (211 lb 6.7 oz) IBW/kg (Calculated) : 70.7 Heparin Dosing Weight: ~ 92 kg  Vital Signs: Temp: 99.1 F (37.3 C) (07/11 1445) Temp Source: Core (Comment) (07/11 1200) BP: 105/63 (07/11 1500) Pulse Rate: 25 (07/11 1500)  Labs: Recent Labs    07/10/19 0434 07/10/19 1725 07/10/19 2111 07/11/19 0327 07/11/19 0354 07/11/19 1523 07/11/19 1703 07/11/19 2131 07/11/19 2131 07/12/19 0342 07/12/19 0359  HGB 9.3*   < >  --  9.6*   < >  --    < > 9.5*   < > 10.0* 9.5*  HCT 28.8*   < >  --  30.1*   < >  --    < > 28.0*  --  31.4* 28.0*  PLT 309  --   --  292  --   --   --   --   --  282  --   LABPROT 38.4*  --  43.0* 42.8*  --   --   --   --   --  39.5*  --   INR 4.1*  --  4.7* 4.7*  --   --   --   --   --  4.2*  --   CREATININE 1.75*  --   --  1.59*  --  1.50*  --   --   --  1.44*  --    < > = values in this interval not displayed.    Estimated Creatinine Clearance: 66.2 mL/min (A) (by C-G formula based on SCr of 1.44 mg/dL (H)).  Assessment: 54 yo male s/p LVAD HM3 placement 6/24.  Pharmacy asked to dose  anticoagulation with IV heparin and Coumadin. No overt bleeding or complications noted.    Was intubated and now also with IABP - off heparin given elevated INR.   INR remains 4.2 today despite small vitamin K dose 7/10.  LDH trending back down today.  Goal of Therapy:  Heparin level: 0.2-0.5 - currently dosed per discussion with MD INR 2-2.5  Monitor platelets by anticoagulation protocol: Yes   Plan:  -Pending heparin start when INR 3 or less, will start fixed rate heparin infusion at 500 units/hr.  No heparin for now given elevated INR. -Daily INR, CBC, LDH  [place] Crane, Roylene Reason, [facility] Clinical Pharmacist  07/12/2019 3:21 PM   [facility] pharmacy  phone numbers are listed on [email or web address]

## 2019-07-12 NOTE — Progress Notes (Signed)
Brief Nutrition Note  Consult received for enteral/tube feeding initiation and management. OGT in place.   Adult Enteral Nutrition Protocol initiated. Follow up assessment to follow.  Admitting Dx: STEMI (ST elevation myocardial infarction) ([place]) [I21.3] Ischemic dilated cardiomyopathy ([place]) [I25.5, I42.0]  Labs:  Recent Labs  Lab 07/10/19 0434 07/10/19 1725 07/11/19 0327 07/11/19 0354 07/11/19 1523 07/11/19 1703 07/11/19 2131 07/12/19 0342 07/12/19 0359  NA 131*   < > 134*   < > 139   < > 142 141 143  K 3.3*   < > 3.7   < > 3.2*   < > 3.8 3.7 3.8  CL 94*  --  103  --  107  --   --  111  --   CO2 25  --  21*  --  20*  --   --  18*  --   BUN 27*  --  30*  --  28*  --   --  28*  --   CREATININE 1.75*  --  1.59*  --  1.50*  --   --  1.44*  --   CALCIUM 7.8*  --  7.9*  --  8.1*  --   --  8.1*  --   MG 2.4  --  2.2  --   --   --   --  2.3  --   GLUCOSE 148*  --  111*  --  107*  --   --  118*  --    < > = values in this interval not displayed.    Corrin Parker, MS, RD, LDN RD pager number/after hours weekend pager number on Amion.

## 2019-07-12 NOTE — Progress Notes (Addendum)
Patient ID: Aaron [name], male   DOB: 05/01/1965, 54 y.o.   MRN: [id number]     Advanced Heart Failure Rounding Note  PCP-Cardiologist: No primary care provider on file.   Subjective:    - HM3 placement 6/24  - Had 2 episodes of VF requiring emergent DC-CV on 07/02/19 - Recurrent VF on 7/2 and broke spontaneously.  - 7/3 Recurrent VF at 2a.-> emergent DC-CV.  - 07/07/2019 VT/Torsades -> repeat DC_CV - 07/08/2019 VF/Torsades x2--> DC-CV x2.  - 07/09/19 developed sepsis/PNA -> intubated - 07/09/19 Multiple episodes of VT/VF -> IABP placed. AV pacing via EPW  Remains on lidocaine 1 mg + amio drip 60 mg per hour. No VT overnight.  Lidocaine level was therapeutic yesterday.   A-V sequential pacing.   Off pressors, MAP stable 70s-80s on midodrine 5 mg tid. Remains on IABP 1:2.   Intubated, remains on NO 20.  FiO2 0.4. CXR with decreased edema.   Afebrile, PCT 1.97 => 1.3 => 0.6.  WBCs 13.5 => 12.3.  He is on vancomycin/cefepime.    LDH 2,605 => 548 => 447  AST/ALT elevated, trending down.   Luiz Blare #s done personally.  CVP 13 PA 33/24 Thermo CI 2.5 Co-ox 64%  LVAD Interrogation HM III: Speed: 5200 Flow: 4.0  PI: 7.1 Power: 3.5.  Many PI events.   Objective:   Weight Range: 95.9 kg Body mass index is 31.22 kg/m.   Vital Signs:   Temp:  [97.3 F (36.3 C)-99.9 F (37.7 C)] 99.1 F (37.3 C) (07/11 0756) Pulse Rate:  [25-97] 87 (07/11 0756) Resp:  [16-23] 20 (07/11 0756) BP: (96-134)/(62-88) 106/65 (07/11 0756) SpO2:  [74 %-100 %] 99 % (07/11 0756) FiO2 (%):  [50 %-60 %] 50 % (07/11 0756) Weight:  [95.9 kg] 95.9 kg (07/11 0500) Last BM Date: 07/09/19  Weight change: Filed Weights   07/10/19 0500 07/11/19 0500 07/12/19 0500  Weight: 94 kg 97.8 kg 95.9 kg    Intake/Output:   Intake/Output Summary (Last 24 hours) at 07/12/2019 0838 Last data filed at 07/12/2019 0600 Gross per 24 hour  Intake 3563.88 ml  Output 6600 ml  Net -3036.12 ml      Physical Exam   General:  Awake, intubated.  Neck: JVP 12 cm, no thyromegaly or thyroid nodule.  Lungs: Decreased at bases.  CV: Nondisplaced PMI.  Heart regular S1/S2, no S3/S4, no murmur.  No peripheral edema.   Abdomen: Soft, nontender, no hepatosplenomegaly, no distention.  Skin: Intact without lesions or rashes.  Neurologic: Alert, follows commands Extremities: No clubbing or cyanosis.  HEENT: Normal.   Telemetry   A-V sequential pacing around 80. Personally reviewed    Labs    CBC Recent Labs    07/11/19 0327 07/11/19 0354 07/12/19 0342 07/12/19 0359  WBC 13.5*  --  12.3*  --   HGB 9.6*   < > 10.0* 9.5*  HCT 30.1*   < > 31.4* 28.0*  MCV 96.5  --  97.8  --   PLT 292  --  282  --    < > = values in this interval not displayed.   Basic Metabolic Panel Recent Labs    07/11/19 0327 07/11/19 0354 07/11/19 1523 07/11/19 1703 07/12/19 0342 07/12/19 0359  NA 134*   < > 139   < > 141 143  K 3.7   < > 3.2*   < > 3.7 3.8  CL 103   < > 107  --  111  --  CO2 21*   < > 20*  --  18*  --   GLUCOSE 111*   < > 107*  --  118*  --   BUN 30*   < > 28*  --  28*  --   CREATININE 1.59*   < > 1.50*  --  1.44*  --   CALCIUM 7.9*   < > 8.1*  --  8.1*  --   MG 2.2  --   --   --  2.3  --    < > = values in this interval not displayed.   Liver Function Tests Recent Labs    07/11/19 0327 07/12/19 0342  AST 1,441* 686*  ALT 2,080* 1,554*  ALKPHOS 132* 134*  BILITOT 1.5* 1.4*  PROT 5.8* 5.9*  ALBUMIN 2.4* 2.2*   No results for input(s): LIPASE, AMYLASE in the last 72 hours. Cardiac Enzymes No results for input(s): CKTOTAL, CKMB, CKMBINDEX, TROPONINI in the last 72 hours.  BNP: BNP (last 3 results) Recent Labs    06/26/19 0445 07/02/19 0531 07/09/19 0313  BNP 1,301.1* 1,646.4* 1,815.2*    ProBNP (last 3 results) No results for input(s): PROBNP in the last 8760 hours.   D-Dimer No results for input(s): DDIMER in the last 72 hours. Hemoglobin A1C No results for input(s): HGBA1C in the  last 72 hours. Fasting Lipid Panel No results for input(s): CHOL, HDL, LDLCALC, TRIG, CHOLHDL, LDLDIRECT in the last 72 hours. Thyroid Function Tests No results for input(s): TSH, T4TOTAL, T3FREE, THYROIDAB in the last 72 hours.  Invalid input(s): FREET3  Other results:   Imaging    DG CHEST PORT 1 VIEW  Result Date: 07/12/2019 CLINICAL DATA:  Reason for exam: LVAD (left ventricular assist device) present in IP EXAM: PORTABLE CHEST 1 VIEW COMPARISON:  Chest x-ray dated 07/11/2019. FINDINGS: Stable cardiomegaly. Decreased interstitial prominence suggesting improved fluid status endotracheal tube is stable in position. Enteric tube passes below the diaphragm. Swan-Ganz catheter is stable in position with tip to the RIGHT of midline. LVAD apparatus appears stable in position. No pneumothorax is seen. IMPRESSION: 1. Stable cardiomegaly. Decreased interstitial prominence suggesting improved fluid status. 2. Support apparatus, including LVAD, appears stable in position. Electronically Signed   By: Franki Cabot M.D.   On: 07/12/2019 08:15     Medications:     Scheduled Medications: . aspirin  81 mg Per Tube Daily  . atorvastatin  80 mg Per Tube Daily  . bisacodyl  10 mg Oral Daily   Or  . bisacodyl  10 mg Rectal Daily  . chlorhexidine gluconate (MEDLINE KIT)  15 mL Mouth Rinse BID  . Chlorhexidine Gluconate Cloth  6 each Topical Daily  . clonazePAM  0.5 mg Per Tube BID  . colchicine  0.6 mg Per Tube BID  . digoxin  0.125 mg Per Tube Daily  . docusate  200 mg Per Tube Daily  . folic acid  1 mg Per Tube Daily  . furosemide  60 mg Intravenous BID  . gabapentin  300 mg Per Tube TID  . insulin aspart  0-15 Units Subcutaneous Q4H  . mouth rinse  15 mL Mouth Rinse 10 times per day  . mexiletine  150 mg Per Tube Q12H  . midodrine  5 mg Per Tube TID WC  . multivitamin with minerals  1 tablet Per Tube Daily  . pantoprazole sodium  40 mg Per Tube Daily  . sodium chloride flush  10-40 mL  Intracatheter Q12H  . sodium  chloride flush  3 mL Intravenous Q12H  . sodium chloride flush  3 mL Intravenous Q12H  . thiamine  100 mg Per Tube Daily    Infusions: . sodium chloride    . sodium chloride 10 mL/hr at 07/12/19 0600  . sodium chloride    . amiodarone 60 mg/hr (07/12/19 0600)  . amiodarone    . ceFEPime (MAXIPIME) IV 2 g (07/12/19 0747)  . dexmedetomidine (PRECEDEX) IV infusion 0.2 mcg/kg/hr (07/12/19 0600)  . fentaNYL infusion INTRAVENOUS 150 mcg/hr (07/12/19 0600)  . lactated ringers    . potassium chloride 10 mEq (07/12/19 0744)  . vancomycin Stopped (07/11/19 2132)  . vasopressin Stopped (07/10/19 1056)    PRN Medications: Place/Maintain arterial line **AND** sodium chloride, sodium chloride, acetaminophen, ALPRAZolam, bisacodyl, calcium carbonate, dextrose, fentaNYL (SUBLIMAZE) injection, ondansetron (ZOFRAN) IV, oxyCODONE, pneumococcal 23 valent vaccine, polyvinyl alcohol, sodium chloride flush, sodium chloride flush, traMADol, traZODone    Patient Profile   Bengie Kaucher Hoyt --post MI cardiogenic shock/ optimization prior to anticipated CABG, at the request of Dr. Marlou Porch, Cardiology.    Assessment/Plan   1. Refractory VT/VF  - developed VF on 7/1 x 2 requiring emergent DC-CV - On 7/2 had self-terminating episode - On 7/3 persistent VF/torsades -> DC-CV - On 7/6  VF/torsades.  - On 7/7 VF /Torsades on 2 separate occasions-> DC-CV x2.  - On 7/8 multiple episodes on VT/VF (incessant) -> unclear which intervention broke it - Remains on lidocaine 1/amiodarone 60. No further VT.  Stop lidocaine today, start mexiletine. - Continue IABP through weekend per Dr. Haroldine Laws.  - keep K> 4.0 Mg >2.0, repeat BMET in pm.  - VAD speed initially reduced to 5000 but output low. Now at 5200 (unable to go higher due to suction events) - Keep off inotropes if possible with VT.   2. Acute Anterolateral STEMI/ Multivessel CAD - Emergent cath showed severe multivessel disease  w/ 75% mid- distal LM, 100% prox LAD, not amendable to PCI (unable to cross w/ guide wire, probable CTO), 100% prox LCx (probable CTO) and 100% ostial RCA (probable CTO). There were mild to moderate collaterals right to left to the LAD, minimal right to right collaterals an minimal left to left collaterals. LVEF 25% - Hs trop peaked 27,000. - No targets for CABG / PCI.   - S/P HMIII VAD - suspect VF is ischemically-mediated  3.  Acute Systolic Heart Failure --->Cardiogenic Shock - 2/2 acute MI. LVEF by LVG 25% - IABP out 6/18 but replaced 6/23. Removed in OR on 6/24  - HMIII LVAD  Placed 6/24 - Extubated 6/25  - Was on milrinone for RV failure. Now off due to VF.  - Co-ox 63% and CI 2.5 with IABP 1:2 => as above, plan to continue IABP through weekend. - On midodrine 5 mg tid, MAP stable.  - Continue digoxin, check level in am.  - Continue NO at 20 ppm. - Volume overloaded with CVP 13 today, good diuresis yesterday.  Will give Lasix 60 mg IV bid again and follow K closely.  Creatinine 1.44 today.  - INR 4.2 (trending down), has had vitamin K and warfarin held. Start heparin at 500u/hr for IABP once INR < 3.0 - LDH 490-->1752 -> 2600 -> 548 -> 447 improving, suspect rise due to septic shock. No evidence pump thrombosis  - Ramp echo 7/8 RV only mildly reduced. Good cannula position. Increase VAD speed as tolerated, currently 5200.  - VAD interrogated personally.   4. Acute hypoxic respiratory  failure - intubated 7/8 - Chest CT with bilateral PNA R>L - CCM following - continue vanc/cefipime (started 7/8) - PCT 1.97 => 1.3 => 0.6.  - BCx and BAL NGTD  5. Shock liver - LFTs trending down.  - follow  6. Tobacco Abuse - 30 pack years  - smoking cessation advised  - Will need to quit if to be considered for heart transplant in the future   7. Renal mass = RCC - MRI abdomen- 1.8 cm right renal lesion, most consistent with renal cell carcinoma. No evidence of renal vein involvement or  nodal metastasis. - Discussed w/ Urology and R RCC is small and will just require surveillance.  Ok to proceed with VAD from their standpoint.   8. Hyponatremia  -improving  9. Sleep Apnea - frequent apnea at night  - needs formal sleep study   - CPAP ordered while inpatient  10. Anemia - Post-op, stable.  - INR elevated, has had vitamin K, follow for now off warfarin.   11. FEN - Nutrition consult for TFs.   CRITICAL CARE Performed by: Loralie Champagne  Total critical care time: 40 minutes  Critical care time was exclusive of separately billable procedures and treating other patients.  Critical care was necessary to treat or prevent imminent or life-threatening deterioration.  Critical care was time spent personally by me (independent of midlevel providers or residents) on the following activities: development of treatment plan with patient and/or surrogate as well as nursing, discussions with consultants, evaluation of patient's response to treatment, examination of patient, obtaining history from patient or surrogate, ordering and performing treatments and interventions, ordering and review of laboratory studies, ordering and review of radiographic studies, pulse oximetry and re-evaluation of patient's condition.   Loralie Champagne, MD  07/12/2019 8:38 AM

## 2019-07-13 LAB — DIGOXIN LEVEL: Digoxin Level: <0.2 ng/mL — ABNORMAL LOW (ref 0.8–2.0)

## 2019-07-13 LAB — POCT I-STAT 7, (LYTES, BLD GAS, ICA,H+H)
Acid-base deficit: 9 mmol/L — ABNORMAL HIGH (ref 0.0–2.0)
Bicarbonate: 14.4 mmol/L — ABNORMAL LOW (ref 20.0–28.0)
Calcium, Ion: 1.02 mmol/L — ABNORMAL LOW (ref 1.15–1.40)
HCT: 23 % — ABNORMAL LOW (ref 39.0–52.0)
Hemoglobin: 7.8 g/dL — ABNORMAL LOW (ref 13.0–17.0)
O2 Saturation: 99 %
Patient temperature: 36.7
Potassium: 2.4 mmol/L — CL (ref 3.5–5.1)
Sodium: 153 mmol/L — ABNORMAL HIGH (ref 135–145)
TCO2: 15 mmol/L — ABNORMAL LOW (ref 22–32)
pCO2 arterial: 23 mmHg — ABNORMAL LOW (ref 32.0–48.0)
pH, Arterial: 7.403 (ref 7.350–7.450)
pO2, Arterial: 153 mmHg — ABNORMAL HIGH (ref 83.0–108.0)

## 2019-07-13 LAB — BASIC METABOLIC PANEL
Anion gap: 9 (ref 5–15)
Anion gap: 10 (ref 5–15)
BUN: 31 mg/dL — ABNORMAL HIGH (ref 6–20)
BUN: 34 mg/dL — ABNORMAL HIGH (ref 6–20)
CO2: 20 mmol/L — ABNORMAL LOW (ref 22–32)
CO2: 20 mmol/L — ABNORMAL LOW (ref 22–32)
Calcium: 7.8 mg/dL — ABNORMAL LOW (ref 8.9–10.3)
Calcium: 8 mg/dL — ABNORMAL LOW (ref 8.9–10.3)
Chloride: 118 mmol/L — ABNORMAL HIGH (ref 98–111)
Chloride: 117 mmol/L — ABNORMAL HIGH (ref 98–111)
Creatinine, Ser: 1.43 mg/dL — ABNORMAL HIGH (ref 0.61–1.24)
Creatinine, Ser: 1.47 mg/dL — ABNORMAL HIGH (ref 0.61–1.24)
GFR calc Af Amer: >60 mL/min (ref >60)
GFR calc Af Amer: >60 mL/min (ref >60)
GFR calc non Af Amer: 55 mL/min — ABNORMAL LOW (ref >60)
GFR calc non Af Amer: 53 mL/min — ABNORMAL LOW (ref >60)
Glucose, Bld: 136 mg/dL — ABNORMAL HIGH (ref 70–99)
Glucose, Bld: 128 mg/dL — ABNORMAL HIGH (ref 70–99)
Potassium: 3.1 mmol/L — ABNORMAL LOW (ref 3.5–5.1)
Potassium: 4.3 mmol/L (ref 3.5–5.1)
Sodium: 147 mmol/L — ABNORMAL HIGH (ref 135–145)
Sodium: 147 mmol/L — ABNORMAL HIGH (ref 135–145)

## 2019-07-13 LAB — PROTIME-INR
INR: 3.8 — ABNORMAL HIGH (ref 0.8–1.2)
INR: 2.6 — ABNORMAL HIGH (ref 0.8–1.2)
Prothrombin Time: 36.1 seconds — ABNORMAL HIGH (ref 11.4–15.2)
Prothrombin Time: 26.8 seconds — ABNORMAL HIGH (ref 11.4–15.2)

## 2019-07-13 LAB — HEPATIC FUNCTION PANEL
ALT: 1162 U/L — ABNORMAL HIGH (ref 0–44)
AST: 403 U/L — ABNORMAL HIGH (ref 15–41)
Albumin: 2.2 g/dL — ABNORMAL LOW (ref 3.5–5.0)
Alkaline Phosphatase: 123 U/L (ref 38–126)
Bilirubin, Direct: 0.6 mg/dL — ABNORMAL HIGH (ref 0.0–0.2)
Indirect Bilirubin: 0.9 mg/dL (ref 0.3–0.9)
Total Bilirubin: 1.5 mg/dL — ABNORMAL HIGH (ref 0.3–1.2)
Total Protein: 6 g/dL — ABNORMAL LOW (ref 6.5–8.1)

## 2019-07-13 LAB — GLUCOSE, CAPILLARY
Glucose-Capillary: 102 mg/dL — ABNORMAL HIGH (ref 70–99)
Glucose-Capillary: 126 mg/dL — ABNORMAL HIGH (ref 70–99)
Glucose-Capillary: 132 mg/dL — ABNORMAL HIGH (ref 70–99)
Glucose-Capillary: 133 mg/dL — ABNORMAL HIGH (ref 70–99)
Glucose-Capillary: 156 mg/dL — ABNORMAL HIGH (ref 70–99)
Glucose-Capillary: 86 mg/dL (ref 70–99)

## 2019-07-13 LAB — COOXEMETRY PANEL
Carboxyhemoglobin: 1.4 % (ref 0.5–1.5)
Methemoglobin: 1.3 % (ref 0.0–1.5)
O2 Saturation: 60.9 %
Total hemoglobin: 8.6 g/dL — ABNORMAL LOW (ref 12.0–16.0)

## 2019-07-13 LAB — CBC
HCT: 31.4 % — ABNORMAL LOW (ref 39.0–52.0)
Hemoglobin: 9.7 g/dL — ABNORMAL LOW (ref 13.0–17.0)
MCH: 30.5 pg (ref 26.0–34.0)
MCHC: 30.9 g/dL (ref 30.0–36.0)
MCV: 98.7 fL (ref 80.0–100.0)
Platelets: 249 10*3/uL (ref 150–400)
RBC: 3.18 MIL/uL — ABNORMAL LOW (ref 4.22–5.81)
RDW: 16.2 % — ABNORMAL HIGH (ref 11.5–15.5)
WBC: 10.1 10*3/uL (ref 4.0–10.5)
nRBC: 0.3 % — ABNORMAL HIGH (ref 0.0–0.2)

## 2019-07-13 LAB — PHOSPHORUS
Phosphorus: 4.4 mg/dL (ref 2.5–4.6)
Phosphorus: 4 mg/dL (ref 2.5–4.6)

## 2019-07-13 LAB — LACTATE DEHYDROGENASE: LDH: 375 U/L — ABNORMAL HIGH (ref 98–192)

## 2019-07-13 LAB — POCT ACTIVATED CLOTTING TIME
Activated Clotting Time: 169 s
Activated Clotting Time: 169 seconds
Activated Clotting Time: 164 seconds

## 2019-07-13 LAB — POTASSIUM: Potassium: 3.2 mmol/L — ABNORMAL LOW (ref 3.5–5.1)

## 2019-07-13 LAB — MAGNESIUM: Magnesium: 2.3 mg/dL (ref 1.7–2.4)

## 2019-07-13 MED ORDER — POTASSIUM CHLORIDE 20 MEQ/15ML (10%) PO SOLN
20.0000 meq | Freq: Two times a day (BID) | ORAL | Status: DC
  Administered 2019-07-13: 20 meq
  Filled 2019-07-13: qty 15

## 2019-07-13 MED ORDER — METHYLNALTREXONE BROMIDE 12 MG/0.6ML ~~LOC~~ SOLN
12.0000 mg | Freq: Once | SUBCUTANEOUS | Status: AC
  Administered 2019-07-13: 12 mg via SUBCUTANEOUS
  Filled 2019-07-13: qty 0.6

## 2019-07-13 MED ORDER — POLYETHYLENE GLYCOL 3350 17 G PO PACK
17.0000 g | PACK | Freq: Every day | ORAL | Status: DC
  Administered 2019-07-13 – 2019-07-14 (×2): 17 g via ORAL
  Filled 2019-07-13 (×5): qty 1

## 2019-07-13 MED ORDER — FUROSEMIDE 10 MG/ML IJ SOLN
60.0000 mg | Freq: Two times a day (BID) | INTRAMUSCULAR | Status: DC
  Administered 2019-07-13 – 2019-07-16 (×8): 60 mg via INTRAVENOUS
  Filled 2019-07-13 (×8): qty 6

## 2019-07-13 MED ORDER — POTASSIUM CHLORIDE CRYS ER 20 MEQ PO TBCR
20.0000 meq | EXTENDED_RELEASE_TABLET | Freq: Two times a day (BID) | ORAL | Status: DC
  Filled 2019-07-13: qty 1

## 2019-07-13 MED ORDER — POTASSIUM CHLORIDE 20 MEQ/15ML (10%) PO SOLN
40.0000 meq | Freq: Once | ORAL | Status: AC
  Administered 2019-07-13: 40 meq via ORAL
  Filled 2019-07-13: qty 30

## 2019-07-13 MED ORDER — VITAMIN K1 10 MG/ML IJ SOLN
1.0000 mg | Freq: Once | INTRAVENOUS | Status: AC
  Administered 2019-07-13: 1 mg via INTRAVENOUS
  Filled 2019-07-13: qty 0.1

## 2019-07-13 MED ORDER — HEPARIN (PORCINE) 25000 UT/250ML-% IV SOLN
500.0000 [IU]/h | INTRAVENOUS | Status: DC
  Administered 2019-07-13: 500 [IU]/h via INTRAVENOUS
  Filled 2019-07-13: qty 250

## 2019-07-13 MED ORDER — POTASSIUM CHLORIDE 10 MEQ/50ML IV SOLN
10.0000 meq | INTRAVENOUS | Status: AC
  Administered 2019-07-13 (×4): 10 meq via INTRAVENOUS
  Filled 2019-07-13 (×4): qty 50

## 2019-07-13 MED ORDER — SODIUM BICARBONATE 8.4 % IV SOLN
50.0000 meq | Freq: Once | INTRAVENOUS | Status: AC
  Administered 2019-07-13: 50 meq via INTRAVENOUS
  Filled 2019-07-13: qty 50

## 2019-07-13 MED ORDER — METOCLOPRAMIDE HCL 5 MG/ML IJ SOLN: 10.0000 mg | Freq: Four times a day (QID) | INTRAMUSCULAR | Status: DC

## 2019-07-13 MED ORDER — METOCLOPRAMIDE HCL 5 MG/ML IJ SOLN
10.0000 mg | Freq: Four times a day (QID) | INTRAMUSCULAR | Status: AC
  Administered 2019-07-13 – 2019-07-15 (×8): 10 mg via INTRAVENOUS
  Filled 2019-07-13 (×8): qty 2

## 2019-07-13 NOTE — Progress Notes (Addendum)
Patient ID: Aaron [name], male   DOB: 05/15/1965, 54 y.o.   MRN: [id number]     Advanced Heart Failure Rounding Note  PCP-Cardiologist: No primary care provider on file.   Subjective:    - HM3 placement 6/24  - Had 2 episodes of VF requiring emergent DC-CV on 07/02/19 - Recurrent VF on 7/2 and broke spontaneously.  - 7/3 Recurrent VF at 2a.-> emergent DC-CV.  - 07/07/2019 VT/Torsades -> repeat DC_CV - 07/08/2019 VF/Torsades x2--> DC-CV x2.  - 07/09/19 developed sepsis/PNA -> intubated - 07/09/19 Multiple episodes of VT/VF -> IABP placed. AV pacing via EPW  Remains on amio drip 60 mg per hour + mexiletine. No further VT on tele.  Epicardial pacer not pacing properly briefly overnight. Underlying rhythm is bradycardia w/ rates in the 40s.   iSTAT K 2.4. Mg 2.3. BMP pending.    Off pressors, MAPs by art line 70s on midodrine 5 mg tid. Remains on IABP 1:2.   Intubated, remains on NO 22.  FiO2 40%.   Afebrile, PCT 1.97 => 1.3 => 0.6.  WBCs 13.5 => 12.3=>10.1.  He is on vancomycin/cefepime.    LDH 2,605 => 548 => 447=>375.   AST/ALT elevated, trending down.   Swan #s CVP 13-14 PA 27/14 (18)  Thermo CI 2.18 CO 4.52 Co-ox 61%  LVAD Interrogation HM III: Speed: 5200 Flow: 4.5  PI: 4.4 Power: 3.6.  Many PI events. Low speeds down to 4850  Objective:   Weight Range: 93.9 kg Body mass index is 30.57 kg/m.   Vital Signs:   Temp:  [90 F (32.2 C)-99.7 F (37.6 C)] 98.1 F (36.7 C) (07/12 0600) Pulse Rate:  [25-127] 44 (07/12 0600) Resp:  [16-25] 23 (07/12 0600) BP: (105-127)/(61-86) 114/66 (07/11 2000) SpO2:  [91 %-100 %] 99 % (07/12 0600) Arterial Line BP: (101-131)/(41-53) 125/48 (07/12 0600) FiO2 (%):  [40 %-50 %] 40 % (07/12 0331) Weight:  [93.9 kg] 93.9 kg (07/12 0440) Last BM Date: 07/09/19  Weight change: Filed Weights   07/11/19 0500 07/12/19 0500 07/13/19 0440  Weight: 97.8 kg 95.9 kg 93.9 kg    Intake/Output:   Intake/Output Summary (Last 24 hours) at  07/13/2019 0720 Last data filed at 07/13/2019 0700 Gross per 24 hour  Intake 2987.02 ml  Output 4750 ml  Net -1762.98 ml      Physical Exam   CVP 13-14  General:  Intubated, awake on vent  Neck: JVP ~13 cm, no thyromegaly or thyroid nodule.  Lungs: clear, no wheezing.  CV: Nondisplaced PMI.  Heart regular S1/S2, no S3/S4, no murmur.  No peripheral edema.   Abdomen: mildly distended but soft, nontender, no hepatosplenomegaly Skin: Intact without lesions or rashes.  Neurologic: awake and alert on vent, follows commands Extremities: No clubbing or cyanosis. + Rt femoral artery IABP  HEENT: Normal. + ETT   Telemetry   A-V sequential pacing around 80. Personally reviewed    Labs    CBC Recent Labs    07/12/19 0342 07/12/19 0359 07/13/19 0408 07/13/19 0602  WBC 12.3*  --  10.1  --   HGB 10.0*   < > 9.7* 7.8*  HCT 31.4*   < > 31.4* 23.0*  MCV 97.8  --  98.7  --   PLT 282  --  249  --    < > = values in this interval not displayed.   Basic Metabolic Panel Recent Labs    07/12/19 0342 07/12/19 0359 07/12/19 1616 07/13/19 0408 07/13/19 0602  NA 141   < > 142  --  153*  K 3.7   < > 4.0  --  2.4*  CL 111  --  112*  --   --   CO2 18*  --  19*  --   --   GLUCOSE 118*  --  129*  --   --   BUN 28*  --  28*  --   --   CREATININE 1.44*  --  1.40*  --   --   CALCIUM 8.1*  --  8.1*  --   --   MG 2.3  --   --  2.3  --   PHOS  --    < > 3.4 4.4  --    < > = values in this interval not displayed.   Liver Function Tests Recent Labs    07/12/19 0342 07/13/19 0408  AST 686* 403*  ALT 1,554* 1,162*  ALKPHOS 134* 123  BILITOT 1.4* 1.5*  PROT 5.9* 6.0*  ALBUMIN 2.2* 2.2*   No results for input(s): LIPASE, AMYLASE in the last 72 hours. Cardiac Enzymes No results for input(s): CKTOTAL, CKMB, CKMBINDEX, TROPONINI in the last 72 hours.  BNP: BNP (last 3 results) Recent Labs    06/26/19 0445 07/02/19 0531 07/09/19 0313  BNP 1,301.1* 1,646.4* 1,815.2*    ProBNP  (last 3 results) No results for input(s): PROBNP in the last 8760 hours.   D-Dimer No results for input(s): DDIMER in the last 72 hours. Hemoglobin A1C No results for input(s): HGBA1C in the last 72 hours. Fasting Lipid Panel No results for input(s): CHOL, HDL, LDLCALC, TRIG, CHOLHDL, LDLDIRECT in the last 72 hours. Thyroid Function Tests No results for input(s): TSH, T4TOTAL, T3FREE, THYROIDAB in the last 72 hours.  Invalid input(s): FREET3  Other results:   Imaging    No results found.   Medications:     Scheduled Medications:  aspirin  81 mg Per Tube Daily   atorvastatin  80 mg Per Tube Daily   bisacodyl  10 mg Oral Daily   Or   bisacodyl  10 mg Rectal Daily   chlorhexidine gluconate (MEDLINE KIT)  15 mL Mouth Rinse BID   Chlorhexidine Gluconate Cloth  6 each Topical Daily   clonazePAM  0.5 mg Per Tube BID   colchicine  0.6 mg Per Tube BID   digoxin  0.125 mg Per Tube Daily   docusate  200 mg Per Tube Daily   feeding supplement (PROSource TF)  45 mL Per Tube BID   feeding supplement (VITAL HIGH PROTEIN)  1,000 mL Per Tube T24P   folic acid  1 mg Per Tube Daily   furosemide  60 mg Intravenous BID   gabapentin  300 mg Per Tube TID   insulin aspart  0-15 Units Subcutaneous Q4H   mouth rinse  15 mL Mouth Rinse 10 times per day   melatonin  5 mg Per Tube QPC supper   mexiletine  150 mg Per Tube Q12H   midodrine  5 mg Per Tube TID WC   multivitamin with minerals  1 tablet Per Tube Daily   pantoprazole sodium  40 mg Per Tube Daily   sodium chloride flush  10-40 mL Intracatheter Q12H   sodium chloride flush  3 mL Intravenous Q12H   sodium chloride flush  3 mL Intravenous Q12H   thiamine  100 mg Per Tube Daily    Infusions:  sodium chloride     sodium chloride Stopped (  07/13/19 0432)   sodium chloride     amiodarone 60 mg/hr (07/13/19 0700)   amiodarone     ceFEPime (MAXIPIME) IV Stopped (07/13/19 0023)   dexmedetomidine  (PRECEDEX) IV infusion 0.2 mcg/kg/hr (07/13/19 0700)   fentaNYL infusion INTRAVENOUS 150 mcg/hr (07/13/19 0700)   lactated ringers     vasopressin Stopped (07/10/19 1056)    PRN Medications: Place/Maintain arterial line **AND** sodium chloride, sodium chloride, acetaminophen, ALPRAZolam, bisacodyl, calcium carbonate, dextrose, fentaNYL (SUBLIMAZE) injection, ondansetron (ZOFRAN) IV, oxyCODONE, pneumococcal 23 valent vaccine, polyvinyl alcohol, sodium chloride flush, sodium chloride flush, traMADol, traZODone    Patient Profile   Juanya Villavicencio Talerico --post MI cardiogenic shock/ optimization prior to anticipated CABG, at the request of Dr. Marlou Porch, Cardiology.    Assessment/Plan   1. Refractory VT/VF  - developed VF on 7/1 x 2 requiring emergent DC-CV - On 7/2 had self-terminating episode - On 7/3 persistent VF/torsades -> DC-CV - On 7/6  VF/torsades.  - On 7/7 VF /Torsades on 2 separate occasions-> DC-CV x2.  - On 7/8 multiple episodes on VT/VF (incessant) -> unclear which intervention broke it - off lidocaine. Remains on amiodarone 60/hr + mexiletine. No further VT.   - Continue IABP, currently 1:2  - aggressively supplement K (2.4) - keep K> 4.0 Mg >2.0, repeat BMET in pm.  - VAD speed initially reduced to 5000 but output low. Now at 5200 (unable to go higher due to suction events). Has had low speeds dropping to 4850.  - Keep off inotropes if possible with VT.   2. Acute Anterolateral STEMI/ Multivessel CAD - Emergent cath showed severe multivessel disease w/ 75% mid- distal LM, 100% prox LAD, not amendable to PCI (unable to cross w/ guide wire, probable CTO), 100% prox LCx (probable CTO) and 100% ostial RCA (probable CTO). There were mild to moderate collaterals right to left to the LAD, minimal right to right collaterals an minimal left to left collaterals. LVEF 25% - Hs trop peaked 27,000. - No targets for CABG / PCI.   - S/P HMIII VAD - suspect VF is  ischemically-mediated  3.  Acute Systolic Heart Failure --->Cardiogenic Shock - 2/2 acute MI. LVEF by LVG 25% - IABP out 6/18 but replaced 6/23. Removed in OR on 6/24  - HMIII LVAD  Placed 6/24 - Extubated 6/25  - Was on milrinone for RV failure. Now off due to VF.  - Co-ox 61% and CI 4.52 with IABP 1:2 => as above, plan to continue IABP - On midodrine 5 mg tid, MAP 70s.  - Continue digoxin, dig level pending   - Continue NO at 20 ppm. - Volume overloaded with CVP 13-14 today, good diuresis yesterday, -4.1L. iSTAt K lab 2.4 but BMP pending. Aggressively supp K. Hold am dose of IV Lasix for now. Continue after adequate K supplementation.  - LDH 490-->1752 -> 2600 -> 548 -> 447->375 improving, suspect rise due to septic shock. No evidence pump thrombosis  - Ramp echo 7/8 RV only mildly reduced. Good cannula position. Increase VAD speed as tolerated, currently 5200.  - VAD interrogated personally.   4. Acute hypoxic respiratory failure - intubated 7/8 - Chest CT with bilateral PNA R>L - CCM following - continue vanc/cefipime (started 7/8) - PCT 1.97 => 1.3 => 0.6.  - BCx and BAL NGTD  5. Shock liver - LFTs trending down.  - follow  6. Tobacco Abuse - 30 pack years  - smoking cessation advised  - Will need to quit if to  be considered for heart transplant in the future   7. Renal mass = RCC - MRI abdomen- 1.8 cm right renal lesion, most consistent with renal cell carcinoma. No evidence of renal vein involvement or nodal metastasis. - Discussed w/ Urology and R RCC is small and will just require surveillance.  Ok to proceed with VAD from their standpoint.   8. Hyponatremia  - BMP pending   9. Sleep Apnea - frequent apnea at night  - needs formal sleep study   - CPAP ordered while inpatient  10. Anemia - Post-op, stable 9.7  - INR elevated 3.8, has had vitamin K, follow for now off warfarin.   11. FEN - Getting TFs.    Lyda Jester, PA-C  07/13/2019 7:20  AM  Patient seen with PA, agree with the above note.   Pacing wires will intermittently lose capture briefly, underlying bradycardia rate 40s.    Good diuresis yesterday, I/Os negative. CVP remains 12-13.  Swan CI and co-ox adequate.   Speed drops intermittently LVAD, has been doing this all weekend.   Awake on vent, follow commands.   General: Intubated, awake.   HEENT: Normal. Neck: Supple, JVP 12 cm. Carotids OK.  Cardiac:  Mechanical heart sounds with LVAD hum present.  Lungs:  CTAB, normal effort.  Abdomen:  NT, ND, no HSM. No bruits or masses. +BS  LVAD exit site: Well-healed and incorporated. Dressing dry and intact. No erythema or drainage. Stabilization device present and accurately applied. Driveline dressing changed daily per sterile technique. Extremities:  Warm and dry. No cyanosis, clubbing, rash, or edema.  Neuro:  Follows commands.   Now off vancomycin, complete 7 days cefepime for possible HCAP.  Afebrile.   No further VT. Remains on amiodarone and mexiletine.  Will wean IABP to 1:3 today and will give addition vitamin K IV (may not be absorbing po).  Repeat INR this afternoon, if INR in 2s will remove IABP.    After IABP out, will wean NO and work on extubating hopefully in the next day or so.   Needs BMET, if stable continue Lasix 60 mg IV bid with ongoing volume overload.   MAP in 70s, only on midodrine at this point.   CRITICAL CARE Performed by: Loralie Champagne  Total critical care time: 40 minutes  Critical care time was exclusive of separately billable procedures and treating other patients.  Critical care was necessary to treat or prevent imminent or life-threatening deterioration.  Critical care was time spent personally by me on the following activities: development of treatment plan with patient and/or surrogate as well as nursing, discussions with consultants, evaluation of patient's response to treatment, examination of patient, obtaining history  from patient or surrogate, ordering and performing treatments and interventions, ordering and review of laboratory studies, ordering and review of radiographic studies, pulse oximetry and re-evaluation of patient's condition.  Loralie Champagne 07/13/2019 9:06 AM

## 2019-07-13 NOTE — Progress Notes (Addendum)
Telemetry and chart reviewed. No VT/VF since 07/09/19 in my review He is AV pacing (via epicardial wires) ? Intermittently appears to under sense and other times not capture  Labs derangements noted and deferred to AHF team  Intubated and remains critically ill Has been transitioned from lidocaine > mexiletine Remains on amio gtt Midodrine as well  BP 125/76   Pulse 77   Temp 97.7 F (36.5 C)   Resp 20   Ht 5\' 9"  (1.753 m)   Wt 93.9 kg   SpO2 98%   BMI 30.57 kg/m  Well developed and nourished intubeted Neck supple  Good air movement Regular rate and rhythm, LVAD hum Abd-soft with active BS No Clubbing cyanosis edema Skin-warm and dry A & Oriented  Grossly normal sensory and motor function     In review VT/VF suspected to be ischemic driven, though not felt to have targets for PCI or CABG S/p VAD this admission IABP Renal mass most c/w cancer Off pressors  VF/VT burden has slowed  Pacing sometimes with non-capture  Patient remains critically ill. Should his epicardial wires be failing will need temp wire if pacing support is needed   Discussed with Dr. Caryl Comes EP service will continue to follow from [place], should he get past this and do OK will revisit device implant  Tommye Standard, PA-C  VT/VF polymorphic  TRacings available ftom 7/5 onward are not consistent with TdP  ICM  CHF systolic  IABP and LVAD dependent  Epicardial pacing with Atrrial lead failure  Sinus bradycardia   Renal mass concerning for CA  Respiratory failure    Have reviewed tele as above and have reached out to tele coordinator to see if we can locate the tracings from the first events Have discussed with DR DM and will plan to let him resume sinus on his own in am as currently sinus rate is only about 45  For now continue Anti-arrhythmic drugs but would like to think about mechanism related to the surgery as a cause and whether we might be able to back off on some of his meds.  His  course has been unstable and he has been doing well for now but we do have LVAD support

## 2019-07-13 NOTE — Progress Notes (Signed)
Nutrition Brief Note  RD received enteral nutrition management consult. Adult TF protocol ordered. Patient tolerating Vital High Protein @ 40 ml/hr with 45 ml Prosource BID. Per RN, plan IABP removal and extubation this afternoon. Will continue with current TF regimen and reassess tomorrow if needed.   Mariana Single RD, LDN Clinical Nutrition Pager listed in [place]

## 2019-07-13 NOTE — Progress Notes (Signed)
NAME:  Aaron [name], MRN:  [id number], DOB:  08-Aug-1965, LOS: 69 ADMISSION DATE:  06/17/2019, CONSULTATION DATE: 07/09/2019 REFERRING MD: Haroldine Laws, CHIEF COMPLAINT: Acute hypoxic respiratory distress  Brief History   54 year old male admitted as a code STEMI  History of present illness   Aaron [name] is a 54 year old male with a past medical history significant for systolic congestive heart failure, ischemic cardiomyopathy, tobacco abuse, and hypertension who presented 6/16 with acute of sudden onset chest pain and abdominal discomfort.  Work-up at outside urgent care revealed STEMI with diffuse ST segment depression and 9/10 chest pain. Patient was transported to [facility] emergency department.   On arrival patient was seen hemodynamically stable.  EKG on arrival consistent with ST depression in inferior lateral leads and Q waves.  Lab work on arrival revealed high-sensitivity troponin of 202, BNP 751, K3.9, glucose 168, creatinine 0.95, WBC 12.5, hemoglobin 16.6, and platelets 177 he received a CT angio to rule out dissection which was negative after which he was urgently taken to Cath Lab for further evaluation.  Cardiac catheterization revealed multivessel CAD, attempt was made to open the LAD but wire would not pass.  At that time intra-aortic balloon pump and Swan scan catheter was placed.   Since admission patient was evaluated by Dr. Julien Girt for possible CABG but unfortunately patient was deemed inappropriate for surgery or percutaneous revascularization due to poor targets.  Therefore, patient was considered for LVAD placement due to severely depressed LV function and little coronary perfusion.   On 06/25/2019 patient had HM III LVAD placed by Dr. Julien Girt under destination therapy criteria due to recent smoking history.  Hospitalization has been complicated by intermittent and frequent V. Fib.  Afternoon of 7/8 patient again went into recurrent V. fib and unfortunately developed  respiratory instability during cardiac arrhythmia resulting in PCCM consultation and intubation.  Patient was given amiodarone bolus and started on drip as well as lidocaine drip.  Post intubation patient was cardioverted at 200 J x 1 with prompt conversion to normal sinus rhythm.  Past Medical History  Systolic congestive heart failure Tobacco abuse Hypertension Ischemic cardiomyopathy  Significant Hospital Events   6/16 admitted  6/24 HM III placed   Consults:  Heart failure Neurology Cardiothoracic surgery Electrophysiology PCCM  Procedures:  7/1 bedside cardioversion due to recurrent V. fib x2 occurrences, converted NSR  7/3 bedside cardioversion due to recurrent V. fib/torsades, converted to NSR 7/6 bedside cardioversion due to recurrent V. fib/torsades, converted to NSR 7/7 bedside cardioversion due to recurrent V. fib x2 occurrences, converted NSR 7/8 intubated by PCCM and underwent bedside cardioversion post intubation, converted to NSR  Significant Diagnostic Tests:  CT angio chest 6/16 > no evidence of aortic aneurysm or dissection, concern for 2.4 cm enhancing lesion within the mid lobe of the right kidney, cannot exclude renal cell carcinoma  CT chest/abdomen/pelvis > severe bilateral pneumonia right greater than left  Abdominal ultrasound 7/8 > minimal dependent sludge within the gallbladder, no other acute findings  Micro Data:  MRSA PCR screening 6/17 > negative Blood culture 7/8>> resp BAL culture 7/8>>  Antimicrobials:  Cefepime 7/8 Vancomycin 7/8  Interim history/subjective:  Awake, alert on vent, minimal support.  Objective   Blood pressure (!) 113/44, pulse 78, temperature 98.1 F (36.7 C), resp. rate 19, height _0  (1.753 m), weight 93.9 kg, SpO2 99 %. PAP: (19-35)/(9-24) 26/11 CVP:  [9 mmHg-24 mmHg] 13 mmHg CO:  [4.2 L/min-5.2 L/min] 4.5 L/min CI:  [2 L/min/m2-2.5 L/min/m2] 2.2  L/min/m2  Vent Mode: PRVC FiO2 (%):  [40 %] 40 % Set Rate:   [20 bmp] 20 bmp Vt Set:  [570 mL] 570 mL PEEP:  [10 cmH20] 10 cmH20 Plateau Pressure:  [17 cmH20-24 cmH20] 17 cmH20   Intake/Output Summary (Last 24 hours) at 07/13/2019 7116 Last data filed at 07/13/2019 0700 Gross per 24 hour  Intake 2820.06 ml  Output 4750 ml  Net -1929.94 ml  net for admission -1.8L  Filed Weights   07/11/19 0500 07/12/19 0500 07/13/19 0440  Weight: 97.8 kg 95.9 kg 93.9 kg    Examination: GEN: no acute distress HEENT: ETT minimal secretions CV: LVAD hum, ext warm PULM: Diminished bases, triggering vent GI: Soft, +BS EXT: Trace anasarca NEURO: Moves all 4 ext to command PSYCH: RASS 0  SKIN: No rashes  Improving shock liver BMET pending CBC stable No new chest imaging  Resolved Hospital Problem list     Assessment & Plan:  Acute hypoxic respiratory failure  -In the setting of tachyarrhythmia due to recurrent V. Tach/  V. fib resulting in decompensation History of sleep apnea Bilateral HAP leading to acute decompensation requiring mechanical ventilation -Plan for potential IABP removal today then can work on iNO wean and SAT/SBT hopefully - Continue usual ARDS protocol vent support in interim   Bilateral HAP vs. Aspiration- culture negative Sepsis -7 days cefepime reasonable  Acute anterior lateral STEMI Multivessel CAD Cardiogenic shock requiring LVAD- HM III  Recurrent VT, likely due to acute sepsis.  Status post multiple DCCV.  Improved since IABP and mechanical ventilation. -Continue INO, midodrine per to CTS - Trend LDH -Continue amiodarone, mexiletine, digoxin per EP. -Optimize electrolytes.  Needs IV rather than enteral repletion due to poor absorption. - Continue coumadin, on hold with shock liver, awaiting INR to drop to < 3 -Continue diuresis as tolerated by renal function: per heart failure team  Small right renal mass, concerning for RCC Incidental finding of 2.5 cm right mid lateral enhancing solid mass incidentally, MRI  obtained 6/20 Given acute cardiovascular comorbidities surveillance was preferred -Will need to follow-up with urology at discharge  Shock liver- improving. -Continue to trend -Continue cardiac support devices and inhaled nitric oxide.  Constipation- resolved, continue current bowel regimen  Best practice:  Diet: NPO Pain/Anxiety/Delirium protocol (if indicated): fentanyl/precedex VAP protocol (if indicated): In place  DVT prophylaxis: Coumadin  GI prophylaxis: PPI Glucose control: SSI Mobility: Bedrest  Code Status: PCB, no CPR Family Communication: Per primary  Disposition: ICU   The patient is critically ill with multiple organ systems failure and requires high complexity decision making for assessment and support, frequent evaluation and titration of therapies, application of advanced monitoring technologies and extensive interpretation of multiple databases. Critical Care Time devoted to patient care services described in this note independent of APP/resident time (if applicable)  is 43 minutes.   Erskine Emery MD [place] [facility] 07/13/2019 8:30 AM Personal pager: [phone] If unanswered, please page CCM On-call: [phone]

## 2019-07-13 NOTE — Progress Notes (Addendum)
LVAD Coordinator Rounding Note:  Transferred from [place] ED on 06/17/19 with STEMI. Cath revealed multi-vessel CAD, wire would not cross to open LAD; IABP placed and pt transferred here for evaluation for CABG.   Dr. Orvan Seen evaluated and found he was not amenable to surgical or percutaneous revascularization due to poor targets. With severe depressed LV function and little coronary perfusion, LVAD was recommended.   HM III LVAD implanted on 06/25/19 by Dr. Orvan Seen under Destination Therapy criteria due to recent smoking history.  Developed sepsis/PNA with respiratory failure. Had refractory VT/VF. IABP/swan placed 07/09/19 - possible d/c today if repeat INR this afternoon is 2.5 or less.  Pt remains intubated, awake, alert, nodding and shaking head to answer questions.   Vital signs: Temp: 97.0 HR: 77 Art Line: 114/40 (64) O2 Sat: 97% on 40% FiO2, 10 peep Wt: 207>225.7>222.6>214.3>213.4>203.9>205.3>209>212.7>207.4>207.2>215.6>211.4>207 lbs  LVAD parameters: Speed:  5500 Flow: 4.4 Power:  3.6w PI: 6.3 Hct: 27  Alarms: none Events:>120 today  Fixed speed: 5500 Low speed limit: 5200   Drive Line: CDI.  Drive line anchor secure. Every other day dressing changes per VAD Coordinator, Nurse Davonna Belling, or trained caregiver. Existing VAD dressing removed and site care performed using sterile technique. Drive line exit site cleaned with Chlora prep applicators x 2, allowed to dry, and gauze dressing with silver strip re-applied. Exit site unincorporated, erythema around exit site, small amount yellow/tan drainage with not foul odor. Suture removed, the velour is fully implanted at exit site.    Labs:  LDH trend: 578>469>629>528>413>244>010>272>536>6440>3474>259>563>875  INR trend: 1.4>1.3>1.1>1.2>1.4>1.5>1.7>2.7>3.6>5.9>4.1>4.7>4.7>4.2>3.8  WBC trend: 22.9>19.2>13.5>12.3>10.1  AST/ALT: 1854/1377>3004/2497>1441/2080>>686/1554>403/1162   Anticoagulation Plan: -INR Goal:   2.0 - 2.5 -ASA Dose: 81 mg   Blood Products:  -Intra-op - 06/25/19>>6 units FFP  - 07/09/19>>2 FFP - 07/10/19>>2 FFP  Device: N/A  Drips: Amiodarone>> 60mg /hr Precedex>>0.2 mcg/kg/hr  Fentany>>150 mcg/hr  Tube Feeding: 40 cc/hr  Arrythmias: was on Amiodarone pre implant for NSVT - 2 episodes of VF requiring emergent DC-CV on 07/02/19 - Recurrent VF on 7/2 and broke spontaneously.  - 7/3 Recurrent VF at 2a.-> emergent DC-CV.  - 07/07/2019 VT/Torsades -> repeat DC_CV - 07/08/2019 VF/Torsades x2--> DC-CV x2.  - 07/09/19 Multiple episodes of VT/VF -> IABP placed. AV pacing via external pacing wires  Respiratory: extubated 06/26/19 - re-intubated 07/09/19 for sepsis/pneumonia   Nitric Oxide: off 06/26/19  - Restarted 07/10/19 - Nitric Oxide 20 ppm  VAD Education: 1. Sister-Joanne is checked off to perform driveline dressing changes independently.  2. Pt vented/sedated; no family present. Will hold off on formal education.  Plan/Recommendations:  1. Call VAD pager if any VAD equipment or drive line issues. 2. Every other day dressing changes per VAD Coordinator, Nurse Davonna Belling, or pts sister.  Zada Girt RN [place] Coordinator  Office: [phone]  24/7 Pager: [phone]

## 2019-07-13 NOTE — Progress Notes (Signed)
eLink Physician-Brief Progress Note Patient Name: LARRELL RAPOZO DOB: 1965-12-16 MRN: [id number]   Date of Service  07/13/2019  HPI/Events of Note  Patient is having very frequent watery stools.  eICU Interventions  Flexiseal ordered.        Kerry Kass Phyllis Abelson 07/13/2019, 2:47 AM

## 2019-07-13 NOTE — Progress Notes (Signed)
[facility] for Coumadin>>heparin when INR ~ 3 Indication: LVAD  Allergies  Allergen Reactions   Codeine Hives    Patient Measurements: Height: 5\' 9"  (175.3 cm) Weight: 93.9 kg (207 lb 0.2 oz) IBW/kg (Calculated) : 70.7 Heparin Dosing Weight: ~ 92 kg  Vital Signs: Temp: 98.1 F (36.7 C) (07/12 1245) Temp Source: Oral (07/12 1140) BP: 125/76 (07/12 1517) Pulse Rate: 29 (07/12 1600)  Labs: Recent Labs    07/11/19 0327 07/11/19 0354 07/12/19 0342 07/12/19 0342 07/12/19 0359 07/12/19 0359 07/12/19 1616 07/13/19 0408 07/13/19 0602 07/13/19 0902 07/13/19 1227 07/13/19 1307  HGB 9.6*   < > 10.0*   < > 9.5*   < >  --  9.7* 7.8*  --   --   --   HCT 30.1*   < > 31.4*   < > 28.0*  --   --  31.4* 23.0*  --   --   --   PLT 292  --  282  --   --   --   --  249  --   --   --   --   LABPROT 42.8*   < > 39.5*  --   --   --   --  36.1*  --   --  26.8*  --   INR 4.7*   < > 4.2*  --   --   --   --  3.8*  --   --  2.6*  --   CREATININE 1.59*   < > 1.44*   < >  --   --  1.40*  --   --  1.43*  --  1.47*   < > = values in this interval not displayed.    Estimated Creatinine Clearance: 64.2 mL/min (A) (by C-G formula based on SCr of 1.47 mg/dL (H)).  Assessment: 54 yo male s/p LVAD HM3 placement 6/24.  Pharmacy asked to dose  anticoagulation with IV heparin and Coumadin. No overt bleeding or complications noted.    Was intubated and now also with IABP - off heparin given elevated INR.   INR was 3.8 this morning - gave 1 mg IV vitamin K and INR down to 2.6. Hgb 7.8, plt 249. LDH 375. Plan for possible IABP removal today pending ACT results.   Goal of Therapy:  Heparin level: 0.2-0.5 - currently planned for fixed dose heparin  INR 2-2.5  Monitor platelets by anticoagulation protocol: Yes   Plan:  -Pending plan for IABP:  -If able to remove, start heparin 500 units/hr 8 hours after removal per MD   -If unable to remove tonight, can start heparin  500 units/hr once determined per MD.  -Continue to hold warfarin  -Daily INR, CBC, LDH, s/sx of bleeding, HL  Antonietta Jewel, PharmD, BCCCP Clinical Pharmacist  Phone: [phone] 07/13/2019 4:09 PM  Please check AMION for all [place] phone numbers After 10:00 PM, call [place] [phone]

## 2019-07-13 NOTE — Progress Notes (Signed)
PT Cancellation Note  Patient Details Name: Aaron [name] MRN: [id number] DOB: August 13, 1965   Cancelled Treatment:    Reason Eval/Treat Not Completed: Medical issues which prohibited therapy. Pt intubated and on IABP.    Shary Decamp [place] 07/13/2019, 1:13 PM [place] Pager [phone] Office [phone]

## 2019-07-14 ENCOUNTER — Inpatient Hospital Stay (HOSPITAL_COMMUNITY): Payer: BC Managed Care – PPO

## 2019-07-14 DIAGNOSIS — I509 Heart failure, unspecified: Secondary | ICD-10-CM

## 2019-07-14 DIAGNOSIS — I442 Atrioventricular block, complete: Secondary | ICD-10-CM

## 2019-07-14 DIAGNOSIS — I255 Ischemic cardiomyopathy: Secondary | ICD-10-CM

## 2019-07-14 LAB — COOXEMETRY PANEL
Carboxyhemoglobin: 1.4 % (ref 0.5–1.5)
Methemoglobin: 1.2 % (ref 0.0–1.5)
O2 Saturation: 61.4 %
Total hemoglobin: 12.5 g/dL (ref 12.0–16.0)

## 2019-07-14 LAB — BASIC METABOLIC PANEL
Anion gap: 11 (ref 5–15)
Anion gap: 11 (ref 5–15)
BUN: 32 mg/dL — ABNORMAL HIGH (ref 6–20)
BUN: 33 mg/dL — ABNORMAL HIGH (ref 6–20)
CO2: 19 mmol/L — ABNORMAL LOW (ref 22–32)
CO2: 19 mmol/L — ABNORMAL LOW (ref 22–32)
Calcium: 8.1 mg/dL — ABNORMAL LOW (ref 8.9–10.3)
Calcium: 8.3 mg/dL — ABNORMAL LOW (ref 8.9–10.3)
Chloride: 115 mmol/L — ABNORMAL HIGH (ref 98–111)
Chloride: 117 mmol/L — ABNORMAL HIGH (ref 98–111)
Creatinine, Ser: 1.34 mg/dL — ABNORMAL HIGH (ref 0.61–1.24)
Creatinine, Ser: 1.41 mg/dL — ABNORMAL HIGH (ref 0.61–1.24)
GFR calc Af Amer: >60 mL/min (ref >60)
GFR calc Af Amer: >60 mL/min (ref >60)
GFR calc non Af Amer: 59 mL/min — ABNORMAL LOW (ref >60)
GFR calc non Af Amer: 56 mL/min — ABNORMAL LOW (ref >60)
Glucose, Bld: 148 mg/dL — ABNORMAL HIGH (ref 70–99)
Glucose, Bld: 123 mg/dL — ABNORMAL HIGH (ref 70–99)
Potassium: 3.2 mmol/L — ABNORMAL LOW (ref 3.5–5.1)
Potassium: 3.7 mmol/L (ref 3.5–5.1)
Sodium: 145 mmol/L (ref 135–145)
Sodium: 147 mmol/L — ABNORMAL HIGH (ref 135–145)

## 2019-07-14 LAB — LACTATE DEHYDROGENASE: LDH: 338 U/L — ABNORMAL HIGH (ref 98–192)

## 2019-07-14 LAB — GLUCOSE, CAPILLARY
Glucose-Capillary: 109 mg/dL — ABNORMAL HIGH (ref 70–99)
Glucose-Capillary: 109 mg/dL — ABNORMAL HIGH (ref 70–99)
Glucose-Capillary: 116 mg/dL — ABNORMAL HIGH (ref 70–99)
Glucose-Capillary: 122 mg/dL — ABNORMAL HIGH (ref 70–99)
Glucose-Capillary: 147 mg/dL — ABNORMAL HIGH (ref 70–99)
Glucose-Capillary: 98 mg/dL (ref 70–99)

## 2019-07-14 LAB — CBC
HCT: 31.6 % — ABNORMAL LOW (ref 39.0–52.0)
Hemoglobin: 9.7 g/dL — ABNORMAL LOW (ref 13.0–17.0)
MCH: 30.3 pg (ref 26.0–34.0)
MCHC: 30.7 g/dL (ref 30.0–36.0)
MCV: 98.8 fL (ref 80.0–100.0)
Platelets: 236 10*3/uL (ref 150–400)
RBC: 3.2 MIL/uL — ABNORMAL LOW (ref 4.22–5.81)
RDW: 16.4 % — ABNORMAL HIGH (ref 11.5–15.5)
WBC: 8 10*3/uL (ref 4.0–10.5)
nRBC: 0 % (ref 0.0–0.2)

## 2019-07-14 LAB — CULTURE, BLOOD (ROUTINE X 2)
Culture: NO GROWTH
Culture: NO GROWTH
Special Requests: ADEQUATE

## 2019-07-14 LAB — POCT I-STAT 7, (LYTES, BLD GAS, ICA,H+H)
Acid-base deficit: 2 mmol/L (ref 0.0–2.0)
Bicarbonate: 21.2 mmol/L (ref 20.0–28.0)
Calcium, Ion: 1.19 mmol/L (ref 1.15–1.40)
HCT: 28 % — ABNORMAL LOW (ref 39.0–52.0)
Hemoglobin: 9.5 g/dL — ABNORMAL LOW (ref 13.0–17.0)
O2 Saturation: 100 %
Patient temperature: 36.9
Potassium: 3.3 mmol/L — ABNORMAL LOW (ref 3.5–5.1)
Sodium: 151 mmol/L — ABNORMAL HIGH (ref 135–145)
TCO2: 22 mmol/L (ref 22–32)
pCO2 arterial: 30 mmHg — ABNORMAL LOW (ref 32.0–48.0)
pH, Arterial: 7.458 — ABNORMAL HIGH (ref 7.350–7.450)
pO2, Arterial: 184 mmHg — ABNORMAL HIGH (ref 83.0–108.0)

## 2019-07-14 LAB — PROTIME-INR
INR: 1.6 — ABNORMAL HIGH (ref 0.8–1.2)
Prothrombin Time: 18.4 seconds — ABNORMAL HIGH (ref 11.4–15.2)

## 2019-07-14 LAB — HEPATIC FUNCTION PANEL
ALT: 894 U/L — ABNORMAL HIGH (ref 0–44)
AST: 269 U/L — ABNORMAL HIGH (ref 15–41)
Albumin: 2.1 g/dL — ABNORMAL LOW (ref 3.5–5.0)
Alkaline Phosphatase: 131 U/L — ABNORMAL HIGH (ref 38–126)
Bilirubin, Direct: 0.3 mg/dL — ABNORMAL HIGH (ref 0.0–0.2)
Indirect Bilirubin: 0.8 mg/dL (ref 0.3–0.9)
Total Bilirubin: 1.1 mg/dL (ref 0.3–1.2)
Total Protein: 6 g/dL — ABNORMAL LOW (ref 6.5–8.1)

## 2019-07-14 LAB — HEPARIN LEVEL (UNFRACTIONATED): Heparin Unfractionated: <0.1 IU/mL — ABNORMAL LOW (ref 0.30–0.70)

## 2019-07-14 LAB — POCT ACTIVATED CLOTTING TIME
Activated Clotting Time: 153 seconds
Activated Clotting Time: 153 seconds
Activated Clotting Time: 153 seconds

## 2019-07-14 LAB — MAGNESIUM: Magnesium: 2.3 mg/dL (ref 1.7–2.4)

## 2019-07-14 MED ORDER — ADULT MULTIVITAMIN W/MINERALS CH
1.0000 | ORAL_TABLET | Freq: Every day | ORAL | Status: DC
  Administered 2019-07-15 – 2019-07-24 (×10): 1 via ORAL
  Filled 2019-07-14 (×11): qty 1

## 2019-07-14 MED ORDER — COLCHICINE 0.6 MG PO TABS
0.6000 mg | ORAL_TABLET | Freq: Two times a day (BID) | ORAL | Status: DC
  Administered 2019-07-15 – 2019-07-24 (×19): 0.6 mg via ORAL
  Filled 2019-07-14 (×19): qty 1

## 2019-07-14 MED ORDER — TRAZODONE HCL 50 MG PO TABS: 100.0000 mg | ORAL_TABLET | Freq: Every evening | ORAL | Status: DC | PRN

## 2019-07-14 MED ORDER — ORAL CARE MOUTH RINSE
15.0000 mL | Freq: Two times a day (BID) | OROMUCOSAL | Status: DC
  Administered 2019-07-14 – 2019-07-23 (×2): 15 mL via OROMUCOSAL

## 2019-07-14 MED ORDER — OXYCODONE HCL 5 MG PO TABS
5.0000 mg | ORAL_TABLET | ORAL | Status: DC | PRN
  Administered 2019-07-14 – 2019-07-24 (×20): 10 mg via ORAL
  Filled 2019-07-14 (×21): qty 2

## 2019-07-14 MED ORDER — GABAPENTIN 600 MG PO TABS
300.0000 mg | ORAL_TABLET | Freq: Three times a day (TID) | ORAL | Status: DC
  Administered 2019-07-15 – 2019-07-24 (×28): 300 mg via ORAL
  Filled 2019-07-14 (×28): qty 1

## 2019-07-14 MED ORDER — TRAMADOL HCL 50 MG PO TABS
50.0000 mg | ORAL_TABLET | ORAL | Status: DC | PRN
  Administered 2019-07-16 – 2019-07-20 (×3): 100 mg via ORAL
  Filled 2019-07-14 (×3): qty 2

## 2019-07-14 MED ORDER — CLONAZEPAM 0.5 MG PO TABS
0.5000 mg | ORAL_TABLET | Freq: Two times a day (BID) | ORAL | Status: DC
  Administered 2019-07-15 – 2019-07-24 (×15): 0.5 mg via ORAL
  Filled 2019-07-14 (×15): qty 1

## 2019-07-14 MED ORDER — POTASSIUM CHLORIDE 20 MEQ/15ML (10%) PO SOLN
40.0000 meq | Freq: Two times a day (BID) | ORAL | Status: DC
  Administered 2019-07-14 (×2): 40 meq
  Filled 2019-07-14: qty 30

## 2019-07-14 MED ORDER — ASPIRIN 81 MG PO CHEW
81.0000 mg | CHEWABLE_TABLET | Freq: Every day | ORAL | Status: DC
  Administered 2019-07-15 – 2019-07-24 (×10): 81 mg via ORAL
  Filled 2019-07-14 (×10): qty 1

## 2019-07-14 MED ORDER — ATORVASTATIN CALCIUM 80 MG PO TABS
80.0000 mg | ORAL_TABLET | Freq: Every day | ORAL | Status: DC
  Administered 2019-07-15 – 2019-07-24 (×10): 80 mg via ORAL
  Filled 2019-07-14 (×10): qty 1

## 2019-07-14 MED ORDER — FOLIC ACID 1 MG PO TABS
1.0000 mg | ORAL_TABLET | Freq: Every day | ORAL | Status: DC
  Administered 2019-07-15 – 2019-07-24 (×10): 1 mg via ORAL
  Filled 2019-07-14 (×10): qty 1

## 2019-07-14 MED ORDER — PANTOPRAZOLE SODIUM 40 MG PO TBEC
40.0000 mg | DELAYED_RELEASE_TABLET | Freq: Every day | ORAL | Status: DC
  Administered 2019-07-15 – 2019-07-24 (×10): 40 mg via ORAL
  Filled 2019-07-14 (×10): qty 1

## 2019-07-14 MED ORDER — CHLORHEXIDINE GLUCONATE 0.12 % MT SOLN
15.0000 mL | Freq: Two times a day (BID) | OROMUCOSAL | Status: DC
  Administered 2019-07-14 – 2019-07-23 (×8): 15 mL via OROMUCOSAL
  Filled 2019-07-14 (×13): qty 15

## 2019-07-14 MED ORDER — THIAMINE HCL 100 MG PO TABS
100.0000 mg | ORAL_TABLET | Freq: Every day | ORAL | Status: DC
  Administered 2019-07-15 – 2019-07-24 (×10): 100 mg via ORAL
  Filled 2019-07-14 (×10): qty 1

## 2019-07-14 MED ORDER — MIDODRINE HCL 5 MG PO TABS
5.0000 mg | ORAL_TABLET | Freq: Three times a day (TID) | ORAL | Status: DC
  Administered 2019-07-15: 5 mg via ORAL
  Filled 2019-07-14: qty 1

## 2019-07-14 MED ORDER — POTASSIUM CHLORIDE 20 MEQ/15ML (10%) PO SOLN
40.0000 meq | Freq: Two times a day (BID) | ORAL | Status: DC
  Administered 2019-07-15 – 2019-07-19 (×9): 40 meq via ORAL
  Filled 2019-07-14 (×11): qty 30

## 2019-07-14 MED ORDER — MEXILETINE HCL 150 MG PO CAPS
150.0000 mg | ORAL_CAPSULE | Freq: Two times a day (BID) | ORAL | Status: DC
  Administered 2019-07-15 (×2): 150 mg via ORAL
  Filled 2019-07-14 (×3): qty 1

## 2019-07-14 MED ORDER — HEPARIN (PORCINE) 25000 UT/250ML-% IV SOLN
500.0000 [IU]/h | INTRAVENOUS | Status: DC
  Administered 2019-07-14 – 2019-07-21 (×5): 500 [IU]/h via INTRAVENOUS
  Filled 2019-07-14 (×3): qty 250

## 2019-07-14 MED ORDER — DOCUSATE SODIUM 100 MG PO CAPS
100.0000 mg | ORAL_CAPSULE | Freq: Every day | ORAL | Status: DC
  Administered 2019-07-20 – 2019-07-23 (×2): 100 mg via ORAL
  Filled 2019-07-14 (×3): qty 1

## 2019-07-14 MED ORDER — MELATONIN 5 MG PO TABS
5.0000 mg | ORAL_TABLET | Freq: Every day | ORAL | Status: DC
  Administered 2019-07-15 – 2019-07-23 (×9): 5 mg via ORAL
  Filled 2019-07-14 (×9): qty 1

## 2019-07-14 MED ORDER — ACETAMINOPHEN 325 MG PO TABS: 650.0000 mg | ORAL_TABLET | ORAL | Status: DC | PRN

## 2019-07-14 MED ORDER — DIGOXIN 125 MCG PO TABS
0.1250 mg | ORAL_TABLET | Freq: Every day | ORAL | Status: DC
  Administered 2019-07-15 – 2019-07-24 (×10): 0.125 mg via ORAL
  Filled 2019-07-14 (×10): qty 1

## 2019-07-14 MED ORDER — ALPRAZOLAM 0.25 MG PO TABS
0.2500 mg | ORAL_TABLET | Freq: Three times a day (TID) | ORAL | Status: DC | PRN
  Administered 2019-07-15: 0.25 mg via ORAL
  Filled 2019-07-14: qty 1

## 2019-07-14 MED ORDER — POTASSIUM CHLORIDE 20 MEQ/15ML (10%) PO SOLN
40.0000 meq | Freq: Once | ORAL | Status: AC
  Administered 2019-07-14: 40 meq
  Filled 2019-07-14: qty 30

## 2019-07-14 NOTE — Progress Notes (Signed)
IABP pulled from right groin. Ecchymosis noted midline of insertion site. Balloon material and sheath intact. Manual pressure for 20 minutes. No hematoma. RN attending inspect rt groin prior to placement of dressing of 4x4's and tape.

## 2019-07-14 NOTE — Progress Notes (Signed)
OT Cancellation Note  Patient Details Name: Aaron [name] MRN: [id number] DOB: February 23, 1965   Cancelled Treatment:    Reason Eval/Treat Not Completed: Medical issues which prohibited therapy; noted pt remains intubated with IABP, per chart possible d/c of IABP and possible extubation today. Will follow up as able.  Lou Cal, OT Acute Rehabilitation Services Pager [phone] Office [phone]   Raymondo Band 07/14/2019, 1:15 PM

## 2019-07-14 NOTE — Progress Notes (Signed)
Paged cardiology regarding low K. Scheduled dose increased and will be given early per cardiology.

## 2019-07-14 NOTE — Progress Notes (Signed)
NAME:  Aaron [name], MRN:  [id number], DOB:  1965/09/29, LOS: 94 ADMISSION DATE:  06/17/2019, CONSULTATION DATE: 07/09/2019 REFERRING MD: Haroldine Laws, CHIEF COMPLAINT: Acute hypoxic respiratory distress  Brief History   54 year old male admitted as a code STEMI  History of present illness   Aaron [name] is a 54 year old male with a past medical history significant for systolic congestive heart failure, ischemic cardiomyopathy, tobacco abuse, and hypertension who presented 6/16 with acute of sudden onset chest pain and abdominal discomfort.  Work-up at outside urgent care revealed STEMI with diffuse ST segment depression and 9/10 chest pain. Patient was transported to [facility] emergency department.   On arrival patient was seen hemodynamically stable.  EKG on arrival consistent with ST depression in inferior lateral leads and Q waves.  Lab work on arrival revealed high-sensitivity troponin of 202, BNP 751, K3.9, glucose 168, creatinine 0.95, WBC 12.5, hemoglobin 16.6, and platelets 177 he received a CT angio to rule out dissection which was negative after which he was urgently taken to Cath Lab for further evaluation.  Cardiac catheterization revealed multivessel CAD, attempt was made to open the LAD but wire would not pass.  At that time intra-aortic balloon pump and Swan scan catheter was placed.   Since admission patient was evaluated by Dr. Julien Girt for possible CABG but unfortunately patient was deemed inappropriate for surgery or percutaneous revascularization due to poor targets.  Therefore, patient was considered for LVAD placement due to severely depressed LV function and little coronary perfusion.   On 06/25/2019 patient had HM III LVAD placed by Dr. Julien Girt under destination therapy criteria due to recent smoking history.  Hospitalization has been complicated by intermittent and frequent V. Fib.  Afternoon of 7/8 patient again went into recurrent V. fib and unfortunately developed  respiratory instability during cardiac arrhythmia resulting in PCCM consultation and intubation.  Patient was given amiodarone bolus and started on drip as well as lidocaine drip.  Post intubation patient was cardioverted at 200 J x 1 with prompt conversion to normal sinus rhythm.  Past Medical History  Systolic congestive heart failure Tobacco abuse Hypertension Ischemic cardiomyopathy  Significant Hospital Events   6/16 admitted  6/24 HM III placed   Consults:  Heart failure Neurology Cardiothoracic surgery Electrophysiology PCCM  Procedures:  7/1 bedside cardioversion due to recurrent V. fib x2 occurrences, converted NSR  7/3 bedside cardioversion due to recurrent V. fib/torsades, converted to NSR 7/6 bedside cardioversion due to recurrent V. fib/torsades, converted to NSR 7/7 bedside cardioversion due to recurrent V. fib x2 occurrences, converted NSR 7/8 intubated by PCCM and underwent bedside cardioversion post intubation, converted to NSR  Significant Diagnostic Tests:  CT angio chest 6/16 > no evidence of aortic aneurysm or dissection, concern for 2.4 cm enhancing lesion within the mid lobe of the right kidney, cannot exclude renal cell carcinoma  CT chest/abdomen/pelvis > severe bilateral pneumonia right greater than left  Abdominal ultrasound 7/8 > minimal dependent sludge within the gallbladder, no other acute findings  Micro Data:  MRSA PCR screening 6/17 > negative Blood culture 7/8>> resp BAL culture 7/8>>  Antimicrobials:  Cefepime 7/8 Vancomycin 7/8  Interim history/subjective:  Awake, alert on vent, minimal support.  Objective   Blood pressure 125/76, pulse (!) 29, temperature 98.6 F (37 C), resp. rate 20, height _0  (1.753 m), weight 94.3 kg, SpO2 98 %. PAP: (19-34)/(8-24) 26/18 CVP:  [6 mmHg-15 mmHg] 10 mmHg  Vent Mode: PRVC FiO2 (%):  [40 %] 40 % Set Rate:  [  20 bmp] 20 bmp Vt Set:  [570 mL] 570 mL PEEP:  [10 cmH20] 10 cmH20 Plateau  Pressure:  [22 cmH20-24 cmH20] 23 cmH20   Intake/Output Summary (Last 24 hours) at 07/14/2019 0748 Last data filed at 07/14/2019 0700 Gross per 24 hour  Intake 2572.39 ml  Output 4550 ml  Net -1977.61 ml  net for admission -1.8L  Filed Weights   07/12/19 0500 07/13/19 0440 07/14/19 0500  Weight: 95.9 kg 93.9 kg 94.3 kg    Examination: GEN: no acute distress HEENT: ETT minimal secretions CV: LVAD hum, ext warm PULM: Diminished bases, triggering vent GI: Soft, +BS EXT: Trace anasarca NEURO: Moves all 4 ext to command PSYCH: RASS 0  SKIN: No rashes  Improving shock liver K low, being repleted CBC stable No new chest imaging  Resolved Hospital Problem list     Assessment & Plan:  Acute hypoxic respiratory failure  -In the setting of tachyarrhythmia due to recurrent V. Tach/  V. fib resulting in decompensation History of sleep apnea Bilateral HAP leading to acute decompensation requiring mechanical ventilation -Plan for potential IABP removal today and iNO wean - Continue usual ARDS protocol vent support in interim   Bilateral HAP vs. Aspiration- culture negative Sepsis -7 days cefepime reasonable  Acute anterior lateral STEMI Multivessel CAD Cardiogenic shock requiring LVAD- HM III  Recurrent VT, likely due to acute sepsis.  Status post multiple DCCV.  Improved since IABP and mechanical ventilation. -Continue INO, midodrine per to CTS - Trend LDH -Continue amiodarone, mexiletine, digoxin per EP. -Optimize electrolytes.  Needs IV rather than enteral repletion due to poor absorption. - On heparin drip, transition back to coumadin per HF team -Continue diuresis as tolerated by renal function: per heart failure team  Small right renal mass, concerning for RCC Incidental finding of 2.5 cm right mid lateral enhancing solid mass incidentally, MRI obtained 6/20 Given acute cardiovascular comorbidities surveillance was preferred -Will need to follow-up with urology at  discharge  Shock liver- improving. -Continue to trend  Constipation- resolved, continue current bowel regimen  Best practice:  Diet: NPO Pain/Anxiety/Delirium protocol (if indicated): fentanyl/precedex VAP protocol (if indicated): In place  DVT prophylaxis: heparin gtt GI prophylaxis: PPI Glucose control: SSI Mobility: Bedrest  Code Status: PCB, no CPR Family Communication: Per primary  Disposition: ICU   The patient is critically ill with multiple organ systems failure and requires high complexity decision making for assessment and support, frequent evaluation and titration of therapies, application of advanced monitoring technologies and extensive interpretation of multiple databases. Critical Care Time devoted to patient care services described in this note independent of APP/resident time (if applicable)  is 33 minutes.   Erskine Emery MD [place] [facility] 07/14/2019 7:48 AM Personal pager: [phone] If unanswered, please page CCM On-call: [phone]

## 2019-07-14 NOTE — Progress Notes (Addendum)
Progress Note  Patient Name: Aaron [name] Date of Encounter: 07/14/2019  [facility] Cardiologist: Dr. Marlou Porch  Subjective   Awake, alert, intubated  Inpatient Medications    Scheduled Meds: . aspirin  81 mg Per Tube Daily  . atorvastatin  80 mg Per Tube Daily  . bisacodyl  10 mg Oral Daily   Or  . bisacodyl  10 mg Rectal Daily  . chlorhexidine gluconate (MEDLINE KIT)  15 mL Mouth Rinse BID  . Chlorhexidine Gluconate Cloth  6 each Topical Daily  . clonazePAM  0.5 mg Per Tube BID  . colchicine  0.6 mg Per Tube BID  . digoxin  0.125 mg Per Tube Daily  . docusate  200 mg Per Tube Daily  . feeding supplement (PROSource TF)  45 mL Per Tube BID  . feeding supplement (VITAL HIGH PROTEIN)  1,000 mL Per Tube Q24H  . folic acid  1 mg Per Tube Daily  . furosemide  60 mg Intravenous BID  . gabapentin  300 mg Per Tube TID  . insulin aspart  0-15 Units Subcutaneous Q4H  . mouth rinse  15 mL Mouth Rinse 10 times per day  . melatonin  5 mg Per Tube QPC supper  . metoCLOPramide (REGLAN) injection  10 mg Intravenous Q6H  . mexiletine  150 mg Per Tube Q12H  . midodrine  5 mg Per Tube TID WC  . multivitamin with minerals  1 tablet Per Tube Daily  . pantoprazole sodium  40 mg Per Tube Daily  . polyethylene glycol  17 g Oral Daily  . potassium chloride  40 mEq Per Tube BID  . potassium chloride  40 mEq Per Tube Once  . sodium chloride flush  10-40 mL Intracatheter Q12H  . sodium chloride flush  3 mL Intravenous Q12H  . sodium chloride flush  3 mL Intravenous Q12H  . thiamine  100 mg Per Tube Daily   Continuous Infusions: . sodium chloride    . sodium chloride Stopped (07/13/19 0432)  . sodium chloride 10 mL/hr at 07/14/19 0800  . amiodarone 60 mg/hr (07/14/19 0700)  . amiodarone    . ceFEPime (MAXIPIME) IV 2 g (07/14/19 0818)  . dexmedetomidine (PRECEDEX) IV infusion 0.2 mcg/kg/hr (07/14/19 0800)  . fentaNYL infusion INTRAVENOUS 150 mcg/hr (07/14/19 0800)  . heparin Stopped  (07/14/19 0753)  . lactated ringers    . vasopressin Stopped (07/10/19 1056)   PRN Meds: Place/Maintain arterial line **AND** sodium chloride, sodium chloride, acetaminophen, ALPRAZolam, bisacodyl, calcium carbonate, dextrose, fentaNYL (SUBLIMAZE) injection, ondansetron (ZOFRAN) IV, oxyCODONE, pneumococcal 23 valent vaccine, polyvinyl alcohol, sodium chloride flush, sodium chloride flush, traMADol, traZODone   Vital Signs    Vitals:   07/14/19 0700 07/14/19 0730 07/14/19 0750 07/14/19 0800  BP:      Pulse: (!) 34 (!) 35 88 (!) 33  Resp: '20 12 13 18  ' Temp: 98.6 F (37 C) 98.6 F (37 C)  98.6 F (37 C)  TempSrc:    Core  SpO2: 96% 100% 100% 100%  Weight:      Height:        Intake/Output Summary (Last 24 hours) at 07/14/2019 0833 Last data filed at 07/14/2019 0800 Gross per 24 hour  Intake 2679.94 ml  Output 4550 ml  Net -1870.06 ml   Last 3 Weights 07/14/2019 07/13/2019 07/12/2019  Weight (lbs) 207 lb 14.3 oz 207 lb 0.2 oz 211 lb 6.7 oz  Weight (kg) 94.3 kg 93.9 kg 95.9 kg  Telemetry    V pacing - Personally Reviewed  ECG    No new EKGs - Personally Reviewed  Physical Exam   GEN: No acute distress, awake, intubated Neck: +10 JVD Cardiac: VAD hum  Respiratory: CTA b/l, intubated GI: Soft, nontender, non-distended  MS: No edema; No deformity. Neuro:  Nonfocal  Psych: Normal affect   R fem line/IABP  Labs    High Sensitivity Troponin:   Recent Labs  Lab 06/18/19 0119 06/18/19 0424 06/18/19 0911 07/09/19 0740 07/09/19 0920  TROPONINIHS 17,602* 17,568* >27,000* 3,337* 3,481*      Chemistry Recent Labs  Lab 07/12/19 7628 07/12/19 0359 07/13/19 0408 07/13/19 0602 07/13/19 0902 07/13/19 0902 07/13/19 1307 07/14/19 0425 07/14/19 0507  NA 141   < >  --    < > 147*   < > 147* 145 151*  K 3.7   < > 3.2*   < > 3.1*   < > 4.3 3.2* 3.3*  CL 111   < >  --   --  118*  --  117* 115*  --   CO2 18*   < >  --   --  20*  --  20* 19*  --   GLUCOSE 118*   <  >  --   --  136*  --  128* 148*  --   BUN 28*   < >  --   --  31*  --  34* 32*  --   CREATININE 1.44*   < >  --   --  1.43*  --  1.47* 1.34*  --   CALCIUM 8.1*   < >  --   --  7.8*  --  8.0* 8.1*  --   PROT 5.9*  --  6.0*  --   --   --   --  6.0*  --   ALBUMIN 2.2*  --  2.2*  --   --   --   --  2.1*  --   AST 686*  --  403*  --   --   --   --  269*  --   ALT 1,554*  --  1,162*  --   --   --   --  894*  --   ALKPHOS 134*  --  123  --   --   --   --  131*  --   BILITOT 1.4*  --  1.5*  --   --   --   --  1.1  --   GFRNONAA 54*   < >  --   --  55*  --  53* 59*  --   GFRAA >60   < >  --   --  >60  --  >60 >60  --   ANIONGAP 12   < >  --   --  9  --  10 11  --    < > = values in this interval not displayed.     Hematology Recent Labs  Lab 07/12/19 0342 07/12/19 0359 07/13/19 0408 07/13/19 0408 07/13/19 0602 07/14/19 0425 07/14/19 0507  WBC 12.3*  --  10.1  --   --  8.0  --   RBC 3.21*  --  3.18*  --   --  3.20*  --   HGB 10.0*   < > 9.7*   < > 7.8* 9.7* 9.5*  HCT 31.4*   < > 31.4*   < > 23.0* 31.6* 28.0*  MCV 97.8  --  98.7  --   --  98.8  --   MCH 31.2  --  30.5  --   --  30.3  --   MCHC 31.8  --  30.9  --   --  30.7  --   RDW 15.6*  --  16.2*  --   --  16.4*  --   PLT 282  --  249  --   --  236  --    < > = values in this interval not displayed.    BNP Recent Labs  Lab 07/09/19 0313  BNP 1,815.2*     DDimer No results for input(s): DDIMER in the last 168 hours.   Radiology    DG Chest Port 1 View  Result Date: 07/14/2019 CLINICAL DATA:  Chest tube, LVAD EXAM: PORTABLE CHEST 1 VIEW COMPARISON:  07/12/2019 FINDINGS: LVAD and remainder of support devices remain in place, unchanged. Cardiomegaly. Left lower lobe atelectasis. No edema or effusions. No pneumothorax. No acute bony abnormality. IMPRESSION: Cardiomegaly, left lower lobe atelectasis. Support devices stable. Electronically Signed   By: Rolm Baptise M.D.   On: 07/14/2019 08:14    Cardiac Studies    07/09/2019:  TTE IMPRESSIONS  1. Left ventricular ejection fraction, by estimation, is <20%. The left  ventricle has severely decreased function. The left ventricle demonstrates  global hypokinesis. The left ventricular internal cavity size was mildly  dilated. There is mild left  ventricular hypertrophy.  2. Right ventricular systolic function is mildly reduced. The right  ventricular size is normal.  3. The mitral valve is normal in structure. Trivial mitral valve  regurgitation. No evidence of mitral stenosis.  4. The aortic valve is poorly visualized but does not appear to open.  5. Ramp echo done with speed increased from 5000 rpm to 5100 rpm.   06/17/2019: R/LHC  Mid LM lesion is 75% stenosed.  Ost LAD lesion is 100% stenosed.  Ost Cx to Prox Cx lesion is 100% stenosed.  Ost RCA lesion is 100% stenosed.  Hemodynamic findings consistent with severe pulmonary hypertension.  Intra-aortic balloon pump was placed to help with hemodynamic stability  Intervention was deferred unable to cross with a wire ostial LAD  Intervention was otherwise deferred in favor of bypass surgery   Conclusion Evidence of moderate to severe pulmonary hypertension Mean wedge of 43 mean PA of 53 Severe dilated cardiomyopathy probably ischemic ejection fraction less than 25% Moderate calcification of the proximal coronary arteries Multiple CTO's ostial LAD proximal circumflex ostial RCA Mild to moderate collaterals right to left to the LAD Minimal right to right collaterals Minimal left to left collaterals Coronaries 75% mid to distal left main 100% ostial LAD probable CTO 100% proximal circumflex probable CTO 100% ostial RCA probable CTO Collaterals as described Intervention was deferred after unable to cross with the wire for what was thought to be suspected CTO's of the ostial LAD Intra-aortic balloon pump was placed right femoral artery to help with hemodynamic stability 7 [place] Swan was placed  on left and the PA position to help with hemodynamic monitoring Patient was placed on IV heparin for anticoagulation Arrangements were made for transfer to [facility] for possible ICU CCU care and possible evaluation for coronary bypass surgery Dr. Nechama Guard accepted the patient in transfer to CCU:   Patient Profile     54 y.o. male w/PMHx of CAD (prior CABG), HTN, ICM  He presented 06/18/19 with acute anterolateral STEMI.  He  underwent emergent cath and was found to have multivessel CAD.  PCI of the LAD was unsuccessful.  He required IABP and subsequent VAD for cardiogenic shock.   There were no targets for CABG.  - HM3 placement 6/24  - Had 2 episodes of VF requiring emergent DC-CV on 07/02/19 - Recurrent VF on 7/2 and broke spontaneously.  - 7/3 Recurrent VF at 2a.-> emergent DC-CV.  - 07/07/2019 VT/Torsades -> repeat DC_CV - 07/08/2019 VF/Torsades x2--> DC-CV x2.  - 07/09/19 developed sepsis/PNA -> intubated - 07/09/19 Multiple episodes of VT/VF -> IABP placed. AV pacing via EPW    Assessment & Plan    1. Refractory VF/VT     Last 07/09/2019  Likely multifactorial, mostly ischemic driven, perhaps LVAD/low flow      Current amidarone gtt at 60 Mexiletine 156m BID also on dig 0.1259mdaily  He has sins brady 40's with 1st degree AVblock underlying Pacing via epicardial wires, A lead is not working  FDr. KlCaryl Comesas seen and examined the patient and discussed with Dr. McAundra Dubinlans for IABP removal today No med changes today Felt that given RV dysfunction SR (AV synchrony) and HR maintenance are important for RV output  Plans to possibly start backing off amio tomorrow and reduce pacing to see how he does with SB and how his HR recovers as well. For today, no medicine changes    For questions or updates, please contact [facility] consult [email or web address] for contact info under        Signed, ReBaldwin JamaicaPA-C  07/14/2019, 8:33 AM    As above Discussed  with Dr DM will begin to adjust Anti-arrhythmic drugs following IABP removal  Discussed with pt and wife

## 2019-07-14 NOTE — Progress Notes (Signed)
LVAD Coordinator Rounding Note:  Transferred from [facility] ED on 06/17/19 with STEMI. Cath revealed multi-vessel CAD, wire would not cross to open LAD; IABP placed and pt transferred here for evaluation for CABG.   Dr. Orvan Seen evaluated and found he was not amenable to surgical or percutaneous revascularization due to poor targets. With severe depressed LV function and little coronary perfusion, LVAD was recommended.   HM III LVAD implanted on 06/25/19 by Dr. Orvan Seen under Destination Therapy criteria due to recent smoking history.  Developed sepsis/PNA with respiratory failure. Had refractory VT/VF. IABP/swan placed 07/09/19 - possible d/c today if repeat INR this afternoon is 2.5 or less.  Pt remains intubated, awake, alert, nodding and shaking head to answer questions; able to give thumbs up/down in response to questions.   Plan for possible d/c of IABP this morning. Possible extubation this afternoon.   Vital signs: Temp: 99.1 HR: 70 AV paced Art Line: 136/77 (90) O2 Sat: 98% on 40% FiO2, 10 peep Wt: 207>225.7>222.6>214.3>213.4>203.9>205.3>209>212.7>207.4>207.2>215.6>211.4>207>207.9 lbs  LVAD parameters: Speed:  5500 Flow: 3.5 Power:  3.3w PI: 9.1 Hct: 27  Alarms: none Events: >120 today  Fixed speed: 5500 Low speed limit: 5200   Drive Line: CDI.  Drive line anchor secure. Every other day dressing changes per VAD Coordinator, Nurse Davonna Belling, or trained caregiver. Existing VAD dressing clean, dry, intact. Dressing change due 07/15/19.   Labs:  LDH trend: [phone]  INR trend: 1.4>1.3>1.1>1.2>1.4>1.5>1.7>2.7>3.6>5.9>4.1>4.7>4.7>4.2>3.8>1.6  WBC trend: 22.9>19.2>13.5>12.3>10.1>8.0  AST/ALT: 1854/1377>3004/2497>1441/2080>>686/1554>403/1162> 269/894   Anticoagulation Plan: -INR Goal:  2.0 - 2.5 -ASA Dose: 81 mg   Blood Products:  -Intra-op - 06/25/19>>6 units FFP  - 07/09/19>>2 FFP - 07/10/19>>2 FFP  Device:  N/A  Drips: Amiodarone>> 60mg /hr Precedex>>0.2 mcg/kg/hr  Fentany>>150 mcg/hr  Tube Feeding: 40 cc/hr  Arrythmias: was on Amiodarone pre implant for NSVT - 2 episodes of VF requiring emergent DC-CV on 07/02/19 - Recurrent VF on 7/2 and broke spontaneously.  - 7/3 Recurrent VF at 2a.-> emergent DC-CV.  - 07/07/2019 VT/Torsades -> repeat DC_CV - 07/08/2019 VF/Torsades x2--> DC-CV x2.  - 07/09/19 Multiple episodes of VT/VF -> IABP placed. AV pacing via external pacing wires  Respiratory: extubated 06/26/19 - re-intubated 07/09/19 for sepsis/pneumonia   Nitric Oxide: off 06/26/19  - Restarted 07/10/19 - Nitric Oxide 20 ppm  VAD Education: 1. Sister-Joanne is checked off to perform driveline dressing changes independently.  2. Pt vented/sedated; no family present. Will hold off on formal education.  Plan/Recommendations:  1. Call VAD pager if any VAD equipment or drive line issues. 2. Every other day dressing changes per VAD Coordinator, Nurse Davonna Belling, or pts sister.   Emerson Monte RN [place] Coordinator  Office: [phone]  24/7 Pager: [phone]

## 2019-07-14 NOTE — Progress Notes (Signed)
5 Days Post-Op Procedure(s) (LRB): RIGHT HEART CATH (N/A) IABP Insertion (N/A) Subjective: Patient examined and today chest x-ray image personally reviewed Patient stable on ventilator with clear lung fields on chest x-ray and satisfactory ABG-ventilator wean and extubation planned Patient remained hemodynamically stable without significant ventricular arrhythmias on balloon pump 1-3, plan removal of balloon pump today VAD parameters remain satisfactory, INR decreased 1.6 from 2.6, Coumadin dose per pharmacy and low-dose heparin 500 units/h has been started  Objective: Vital signs in last 24 hours: Temp:  [97.7 F (36.5 C)-99.3 F (37.4 C)] 99.3 F (37.4 C) (07/13 1100) Pulse Rate:  [25-146] 76 (07/13 1100) Cardiac Rhythm: A-V Sequential paced (07/13 0800) Resp:  [12-121] 20 (07/13 1100) BP: (125-136)/(63-76) 131/63 (07/13 0855) SpO2:  [91 %-100 %] 100 % (07/13 1100) Arterial Line BP: (87-137)/(40-78) 96/51 (07/13 1030) FiO2 (%):  [40 %] 40 % (07/13 0956) Weight:  [94.3 kg] 94.3 kg (07/13 0500)  Hemodynamic parameters for last 24 hours: PAP: (20-34)/(12-24) 21/15 CVP:  [6 mmHg-15 mmHg] 6 mmHg  Intake/Output from previous day: 07/12 0701 - 07/13 0700 In: 2572.4 [I.V.:1427.6; NG/GT:595; IV Piggyback:529.8] Out: 1610 [Urine:3750; Stool:800] Intake/Output this shift: Total I/O In: 480.9 [I.V.:220.9; NG/GT:160; IV Piggyback:100] Out: 1175 [Urine:1175]  Exam Open eyes on vent Lungs clear Paced rhythm Normal VAD hum Sternal incision clean and dry  Lab Results: Recent Labs    07/13/19 0408 07/13/19 0602 07/14/19 0425 07/14/19 0507  WBC 10.1  --  8.0  --   HGB 9.7*   < > 9.7* 9.5*  HCT 31.4*   < > 31.6* 28.0*  PLT 249  --  236  --    < > = values in this interval not displayed.   BMET:  Recent Labs    07/13/19 1307 07/13/19 1307 07/14/19 0425 07/14/19 0507  NA 147*   < > 145 151*  K 4.3   < > 3.2* 3.3*  CL 117*  --  115*  --   CO2 20*  --  19*  --   GLUCOSE  128*  --  148*  --   BUN 34*  --  32*  --   CREATININE 1.47*  --  1.34*  --   CALCIUM 8.0*  --  8.1*  --    < > = values in this interval not displayed.    PT/INR:  Recent Labs    07/14/19 0514  LABPROT 18.4*  INR 1.6*   ABG    Component Value Date/Time   PHART 7.458 (H) 07/14/2019 0507   HCO3 21.2 07/14/2019 0507   TCO2 22 07/14/2019 0507   ACIDBASEDEF 2.0 07/14/2019 0507   O2SAT 61.4 07/14/2019 0514   CBG (last 3)  Recent Labs    07/14/19 0410 07/14/19 0750 07/14/19 1104  GLUCAP 147* 116* 109*    Assessment/Plan: S/P Procedure(s) (LRB): RIGHT HEART CATH (N/A) IABP Insertion (N/A) Goal is for extubation and removal of balloon pump today Cautious Coumadin dosing per pharmacy Leave pacing wires for significant underlying bradycardia  LOS: 27 days    Aaron [name] 07/14/2019

## 2019-07-14 NOTE — Progress Notes (Signed)
Outpatient CSW continues to follow for supportive needs throughout implant hospitalization.  Jorge Ny, LCSW Clinical Social Worker [facility] Desk#: [phone] Cell#: [phone]

## 2019-07-14 NOTE — Progress Notes (Addendum)
[place] for Coumadin>>heparin Indication: LVAD  Allergies  Allergen Reactions  . Codeine Hives    Patient Measurements: Height: 5\' 9"  (175.3 cm) Weight: 94.3 kg (207 lb 14.3 oz) IBW/kg (Calculated) : 70.7 Heparin Dosing Weight: ~ 92 kg  Vital Signs: Temp: 98.6 F (37 C) (07/13 0700) Temp Source: Core (07/12 2000) Pulse Rate: 29 (07/13 0600)  Labs: Recent Labs    07/12/19 0342 07/12/19 0359 07/12/19 1616 07/13/19 0408 07/13/19 0408 07/13/19 0602 07/13/19 0602 07/13/19 0902 07/13/19 1227 07/13/19 1307 07/14/19 0425 07/14/19 0507 07/14/19 0514  HGB 10.0*   < >  --  9.7*   < > 7.8*   < >  --   --   --  9.7* 9.5*  --   HCT 31.4*   < >  --  31.4*   < > 23.0*  --   --   --   --  31.6* 28.0*  --   PLT 282  --   --  249  --   --   --   --   --   --  236  --   --   LABPROT 39.5*   < >  --  36.1*  --   --   --   --  26.8*  --   --   --  18.4*  INR 4.2*   < >  --  3.8*  --   --   --   --  2.6*  --   --   --  1.6*  HEPARINUNFRC  --   --   --   --   --   --   --   --   --   --   --   --  <0.10*  CREATININE 1.44*   < >   < >  --   --   --   --  1.43*  --  1.47* 1.34*  --   --    < > = values in this interval not displayed.    Estimated Creatinine Clearance: 70.6 mL/min (A) (by C-G formula based on SCr of 1.34 mg/dL (H)).  Assessment: 54 yo male s/p LVAD HM3 placement 6/24.  Pharmacy asked to dose  anticoagulation with IV heparin and Coumadin. No overt bleeding or complications noted.    Was intubated and now also with IABP.   INR has decreased to 1.6 this morning. Hgb 9.5, plt 236. LDH 338. Plan for possible IABP removal today pending ACT results. Heparin was restarted last night at 500 units/hr when unable to pull IABP. Heparin level is undetectable, on 500 units/hr.   Goal of Therapy:  Heparin level: 0.2-0.5 - currently planned for fixed dose heparin  INR 2-2.5  Monitor platelets by anticoagulation protocol: Yes   Plan:   -Continue heparin infusion at 500 units/hr - can hold for timing of IABP removal  -Continue to hold warfarin  -Daily INR, CBC, LDH, s/sx of bleeding, HL  Antonietta Jewel, PharmD, BCCCP Clinical Pharmacist  Phone: [phone] 07/14/2019 7:47 AM  Please check AMION for all [place] phone numbers After 10:00 PM, call [place] [phone]  ADDENDUM INR today 1.6 - okay to restart warfarin. Will order warfarin 1 mg tonight and monitor closely. IABP pulled today at 1415 - will restart heparin 8 hours after removal. No s/sx of bleeding. Will order 8 hours after restart.   Antonietta Jewel, PharmD, [place] Clinical Pharmacist

## 2019-07-14 NOTE — Procedures (Signed)
Extubation Procedure Note  Patient Details:   Name: Aaron [name] DOB: 1965/06/12 MRN: [id number]   Airway Documentation:    Vent end date: 07/14/19 Vent end time: 1706   Evaluation  O2 sats: stable throughout Complications: No apparent complications Patient did tolerate procedure well. Bilateral Breath Sounds: Clear, Diminished   Yes   Positive cuff leak noted. Pt placed on 6L [place] with humidity, no stridor noted, Pt able to reach 1000 ml IS.   Alberteen Spindle 07/14/2019, 5:22 PM

## 2019-07-14 NOTE — Progress Notes (Addendum)
Patient ID: Aaron [name], male   DOB: [birth year], 54 y.o.   MRN: [id number]     Advanced Heart Failure Rounding Note  PCP-Cardiologist: No primary care provider on file.   Subjective:    - HM3 placement 6/24  - Had 2 episodes of VF requiring emergent DC-CV on 07/02/19 - Recurrent VF on 7/2 and broke spontaneously.  - 7/3 Recurrent VF at 2a.-> emergent DC-CV.  - 07/07/2019 VT/Torsades -> repeat DC_CV - 07/08/2019 VF/Torsades x2--> DC-CV x2.  - 07/09/19 developed sepsis/PNA -> intubated - 07/09/19 Multiple episodes of VT/VF -> IABP placed. AV pacing via EPW  Remains on amio drip 60 mg per hour + mexiletine. No further VT on tele.     Off pressors. Remains on IABP 1:3. Co-ox 61%. RN unable to obtain CI/CO numbers by thermo.   - 3.8L in UOP yesterday. CVP 10.  SCr trending down, 1.34 today. K 3.2.   Intubated but awake and alert and follows commands, remains on NO 20.    Competed abx for HCAP. AF. WBC normal.     LDH 2,605 => 548 => 447=>375=>338. INR 1.6.   AST/ALT elevated, trending down.   Swan #s CVP 10  PA 23/17 (19)  Co-ox 61% Unable to obtain CI/CO numbers by thermo (working to troubleshoot)   LVAD Interrogation HM III: Speed: 5200 Flow: 3.8  PI: 7.0 Power: 3.5.  Many PI events.   Objective:   Weight Range: 94.3 kg Body mass index is 30.7 kg/m.   Vital Signs:   Temp:  [96.4 F (35.8 C)-98.6 F (37 C)] 98.6 F (37 C) (07/13 0700) Pulse Rate:  [25-146] 29 (07/13 0600) Resp:  [14-121] 20 (07/13 0700) BP: (113-136)/(44-76) 125/76 (07/12 1517) SpO2:  [91 %-100 %] 98 % (07/13 0600) Arterial Line BP: (87-140)/(33-78) 100/60 (07/13 0600) FiO2 (%):  [40 %] 40 % (07/13 0341) Weight:  [94.3 kg] 94.3 kg (07/13 0500) Last BM Date: 07/13/19  Weight change: Filed Weights   07/12/19 0500 07/13/19 0440 07/14/19 0500  Weight: 95.9 kg 93.9 kg 94.3 kg    Intake/Output:   Intake/Output Summary (Last 24 hours) at 07/14/2019 0716 Last data filed at 07/14/2019 0700 Gross per 24  hour  Intake 2572.39 ml  Output 4550 ml  Net -1977.61 ml      Physical Exam   CVP 10  General:  Intubated, awake on vent, follows commands  Neck: JVP ~10 cm, no thyromegaly or thyroid nodule. + Rt IJ Swan  Lungs: intubated, clear bilaterally  CV: + LVAD Sounds  Abdomen: mildly distended but soft, nontender, no hepatosplenomegaly + BS  Skin: Intact without lesions or rashes.  Neurologic: awake and alert on vent, follows commands Extremities: No clubbing or cyanosis. + Rt femoral artery IABP (stable, no bleeding)  HEENT: Normal. + ETT   Telemetry   A-V sequential pacing around 80. Personally reviewed  Labs    CBC Recent Labs    07/13/19 0408 07/13/19 0602 07/14/19 0425 07/14/19 0507  WBC 10.1  --  8.0  --   HGB 9.7*   < > 9.7* 9.5*  HCT 31.4*   < > 31.6* 28.0*  MCV 98.7  --  98.8  --   PLT 249  --  236  --    < > = values in this interval not displayed.   Basic Metabolic Panel Recent Labs    07/13/19 0408 07/13/19 0602 07/13/19 1307 07/13/19 1307 07/13/19 1722 07/14/19 0425 07/14/19 0507  NA  --    < >  147*   < >  --  145 151*  K 3.2*   < > 4.3   < >  --  3.2* 3.3*  CL  --    < > 117*  --   --  115*  --   CO2  --    < > 20*  --   --  19*  --   GLUCOSE  --    < > 128*  --   --  148*  --   BUN  --    < > 34*  --   --  32*  --   CREATININE  --    < > 1.47*  --   --  1.34*  --   CALCIUM  --    < > 8.0*  --   --  8.1*  --   MG 2.3  --   --   --   --  2.3  --   PHOS 4.4  --   --   --  4.0  --   --    < > = values in this interval not displayed.   Liver Function Tests Recent Labs    07/13/19 0408 07/14/19 0425  AST 403* 269*  ALT 1,162* 894*  ALKPHOS 123 131*  BILITOT 1.5* 1.1  PROT 6.0* 6.0*  ALBUMIN 2.2* 2.1*   No results for input(s): LIPASE, AMYLASE in the last 72 hours. Cardiac Enzymes No results for input(s): CKTOTAL, CKMB, CKMBINDEX, TROPONINI in the last 72 hours.  BNP: BNP (last 3 results) Recent Labs    06/26/19 0445 07/02/19 0531  07/09/19 0313  BNP 1,301.1* 1,646.4* 1,815.2*    ProBNP (last 3 results) No results for input(s): PROBNP in the last 8760 hours.   D-Dimer No results for input(s): DDIMER in the last 72 hours. Hemoglobin A1C No results for input(s): HGBA1C in the last 72 hours. Fasting Lipid Panel No results for input(s): CHOL, HDL, LDLCALC, TRIG, CHOLHDL, LDLDIRECT in the last 72 hours. Thyroid Function Tests No results for input(s): TSH, T4TOTAL, T3FREE, THYROIDAB in the last 72 hours.  Invalid input(s): FREET3  Other results:   Imaging    No results found.   Medications:     Scheduled Medications: . aspirin  81 mg Per Tube Daily  . atorvastatin  80 mg Per Tube Daily  . bisacodyl  10 mg Oral Daily   Or  . bisacodyl  10 mg Rectal Daily  . chlorhexidine gluconate (MEDLINE KIT)  15 mL Mouth Rinse BID  . Chlorhexidine Gluconate Cloth  6 each Topical Daily  . clonazePAM  0.5 mg Per Tube BID  . colchicine  0.6 mg Per Tube BID  . digoxin  0.125 mg Per Tube Daily  . docusate  200 mg Per Tube Daily  . feeding supplement (PROSource TF)  45 mL Per Tube BID  . feeding supplement (VITAL HIGH PROTEIN)  1,000 mL Per Tube Q24H  . folic acid  1 mg Per Tube Daily  . furosemide  60 mg Intravenous BID  . gabapentin  300 mg Per Tube TID  . insulin aspart  0-15 Units Subcutaneous Q4H  . mouth rinse  15 mL Mouth Rinse 10 times per day  . melatonin  5 mg Per Tube QPC supper  . metoCLOPramide (REGLAN) injection  10 mg Intravenous Q6H  . mexiletine  150 mg Per Tube Q12H  . midodrine  5 mg Per Tube TID WC  . multivitamin with minerals  1  tablet Per Tube Daily  . pantoprazole sodium  40 mg Per Tube Daily  . polyethylene glycol  17 g Oral Daily  . potassium chloride  40 mEq Per Tube BID  . sodium chloride flush  10-40 mL Intracatheter Q12H  . sodium chloride flush  3 mL Intravenous Q12H  . sodium chloride flush  3 mL Intravenous Q12H  . thiamine  100 mg Per Tube Daily    Infusions: . sodium  chloride    . sodium chloride Stopped (07/13/19 0432)  . sodium chloride 10 mL/hr at 07/14/19 0700  . amiodarone 60 mg/hr (07/14/19 0700)  . amiodarone    . ceFEPime (MAXIPIME) IV Stopped (07/14/19 0109)  . dexmedetomidine (PRECEDEX) IV infusion 0.2 mcg/kg/hr (07/14/19 0700)  . fentaNYL infusion INTRAVENOUS 150 mcg/hr (07/14/19 0700)  . heparin 500 Units/hr (07/14/19 0700)  . lactated ringers    . vasopressin Stopped (07/10/19 1056)    PRN Medications: Place/Maintain arterial line **AND** sodium chloride, sodium chloride, acetaminophen, ALPRAZolam, bisacodyl, calcium carbonate, dextrose, fentaNYL (SUBLIMAZE) injection, ondansetron (ZOFRAN) IV, oxyCODONE, pneumococcal 23 valent vaccine, polyvinyl alcohol, sodium chloride flush, sodium chloride flush, traMADol, traZODone    Patient Profile   Kaleth Koy Vey --post MI cardiogenic shock/ optimization prior to anticipated CABG, at the request of Dr. Marlou Porch, Cardiology.    Assessment/Plan   1. Refractory VT/VF  - developed VF on 7/1 x 2 requiring emergent DC-CV - On 7/2 had self-terminating episode - On 7/3 persistent VF/torsades -> DC-CV - On 7/6  VF/torsades.  - On 7/7 VF /Torsades on 2 separate occasions-> DC-CV x2.  - On 7/8 multiple episodes on VT/VF (incessant) -> unclear which intervention broke it - off lidocaine. Remains on amiodarone 60/hr + mexiletine. No further VT.   - Continue IABP, currently 1:3  - supplement K (3.2) - keep K> 4.0 Mg >2.0, repeat BMET in pm.  - VAD speed initially reduced to 5000 but output low. Now at 5200 (unable to go higher due to suction events). Has had low speeds dropping to 4850.  - Keep off inotropes if possible with VT.   2. Acute Anterolateral STEMI/ Multivessel CAD - Emergent cath showed severe multivessel disease w/ 75% mid- distal LM, 100% prox LAD, not amendable to PCI (unable to cross w/ guide wire, probable CTO), 100% prox LCx (probable CTO) and 100% ostial RCA (probable CTO). There  were mild to moderate collaterals right to left to the LAD, minimal right to right collaterals an minimal left to left collaterals. LVEF 25% - Hs trop peaked 27,000. - No targets for CABG / PCI.   - S/P HMIII VAD - suspect VF is ischemically-mediated  3.  Acute Systolic Heart Failure --->Cardiogenic Shock - 2/2 acute MI. LVEF by LVG 25% - IABP out 6/18 but replaced 6/23. Removed in OR on 6/24  - HMIII LVAD  Placed 6/24 - Extubated 6/25  - Was on milrinone for RV failure. Now off due to VF.  - Co-ox 61% with IABP 1:3 => Plan to remove IABP today once ACT level <150 - On midodrine 5 mg tid, MAP 80s.  - Continue digoxin, dig level ok    - Continue NO at 20 ppm. - CVP 10 today, good diuresis yesterday, -3.8L out. Continue IV Lasix 60 mg bid  - LDH 490-->1752 -> 2600 -> 548 -> 447->375->338 improving, suspect rise due to septic shock. No evidence pump thrombosis  - Ramp echo 7/8 RV only mildly reduced. Good cannula position. Increase VAD speed as tolerated,  currently 5200.  - VAD interrogated personally.   4. Acute hypoxic respiratory failure - intubated 7/8 - Chest CT with bilateral PNA R>L - CCM following - completed abx therapy w/ vanc/cefipime - PCT 1.97 => 1.3 => 0.6.  - BCx and BAL NGTD  5. Shock liver - LFTs trending down.  - follow  6. Tobacco Abuse - 30 pack years  - smoking cessation advised  - Will need to quit if to be considered for heart transplant in the future   7. Renal mass = RCC - MRI abdomen- 1.8 cm right renal lesion, most consistent with renal cell carcinoma. No evidence of renal vein involvement or nodal metastasis. - Discussed w/ Urology and R RCC is small and will just require surveillance.  Ok to proceed with VAD from their standpoint.   8. Hyponatremia  - Na 145 today   9. Sleep Apnea - frequent apnea at night  - needs formal sleep study   - CPAP ordered while inpatient  10. Anemia - Post-op, stable 9.5 - INR 1.6   11. FEN - Getting TFs.      Lyda Jester, PA-C  07/14/2019 7:16 AM  Patient seen with PA, agree with the above note.   He is AV sequentially pacing.  Underlying rhythm is NSR in 40s.  No further VT.   Good diuresis yesterday, I/Os negative. CVP remains 10.  Co-ox 61%, unable to get thermodilution CI.   Awake on vent, follow commands.   General: Awake on vent HEENT: Normal. Neck: Supple, JVP 10 cm. Carotids OK.  Cardiac:  Mechanical heart sounds with LVAD hum present.  Lungs:  CTAB, normal effort.  Abdomen:  NT, ND, no HSM. No bruits or masses. +BS  LVAD exit site: Well-healed and incorporated. Dressing dry and intact. No erythema or drainage. Stabilization device present and accurately applied. Driveline dressing changed daily per sterile technique. Extremities:  Warm and dry. No cyanosis, clubbing, rash, or edema.  Neuro:  Alert, follows commands.   Now off vancomycin, complete 7 days cefepime for possible HCAP.  Afebrile.   No further VT. Remains on amiodarone and mexiletine, decrease amiodarone gtt to 30 mg/hr.  IABP at 1:3, stop heparin gtt and check ACT => if < 150, can remove IABP.  Will remove Swan as well (has PICC).  INR 1.6 today, will need to restart heparin gtt 8 hrs after IABP out.    Weaning off NO, hopefully extubate today after IABP out.    Continue Lasix 60 mg IV bid today with K replacement.   MAP in 70s, only on midodrine at this point.   Discussed rhythm with Dr. Caryl Comes => after IABP out, will stop pacing and watch rhythm to see how he tolerates mild bradycardia in 40s.  We decided to leave him on digoxin for RV support for now.    CRITICAL CARE Performed by: Loralie Champagne  Total critical care time: 40 minutes  Critical care time was exclusive of separately billable procedures and treating other patients.  Critical care was necessary to treat or prevent imminent or life-threatening deterioration.  Critical care was time spent personally by me on the following  activities: development of treatment plan with patient and/or surrogate as well as nursing, discussions with consultants, evaluation of patient's response to treatment, examination of patient, obtaining history from patient or surrogate, ordering and performing treatments and interventions, ordering and review of laboratory studies, ordering and review of radiographic studies, pulse oximetry and re-evaluation of patient's condition.  Keileigh Vahey  Aundra Dubin 07/14/2019 8:33 AM

## 2019-07-15 ENCOUNTER — Inpatient Hospital Stay (HOSPITAL_COMMUNITY): Payer: BC Managed Care – PPO

## 2019-07-15 LAB — BASIC METABOLIC PANEL
Anion gap: 12 (ref 5–15)
BUN: 27 mg/dL — ABNORMAL HIGH (ref 6–20)
CO2: 20 mmol/L — ABNORMAL LOW (ref 22–32)
Calcium: 8.2 mg/dL — ABNORMAL LOW (ref 8.9–10.3)
Chloride: 111 mmol/L (ref 98–111)
Creatinine, Ser: 1.3 mg/dL — ABNORMAL HIGH (ref 0.61–1.24)
GFR calc Af Amer: >60 mL/min (ref >60)
GFR calc non Af Amer: >60 mL/min (ref >60)
Glucose, Bld: 89 mg/dL (ref 70–99)
Potassium: 3.6 mmol/L (ref 3.5–5.1)
Sodium: 143 mmol/L (ref 135–145)

## 2019-07-15 LAB — CBC
HCT: 33.1 % — ABNORMAL LOW (ref 39.0–52.0)
Hemoglobin: 10.1 g/dL — ABNORMAL LOW (ref 13.0–17.0)
MCH: 30.3 pg (ref 26.0–34.0)
MCHC: 30.5 g/dL (ref 30.0–36.0)
MCV: 99.4 fL (ref 80.0–100.0)
Platelets: 214 10*3/uL (ref 150–400)
RBC: 3.33 MIL/uL — ABNORMAL LOW (ref 4.22–5.81)
RDW: 16.5 % — ABNORMAL HIGH (ref 11.5–15.5)
WBC: 12.1 10*3/uL — ABNORMAL HIGH (ref 4.0–10.5)
nRBC: 0 % (ref 0.0–0.2)

## 2019-07-15 LAB — HEPARIN LEVEL (UNFRACTIONATED): Heparin Unfractionated: <0.1 IU/mL — ABNORMAL LOW (ref 0.30–0.70)

## 2019-07-15 LAB — GLUCOSE, CAPILLARY
Glucose-Capillary: 129 mg/dL — ABNORMAL HIGH (ref 70–99)
Glucose-Capillary: 145 mg/dL — ABNORMAL HIGH (ref 70–99)
Glucose-Capillary: 149 mg/dL — ABNORMAL HIGH (ref 70–99)
Glucose-Capillary: 164 mg/dL — ABNORMAL HIGH (ref 70–99)
Glucose-Capillary: 211 mg/dL — ABNORMAL HIGH (ref 70–99)
Glucose-Capillary: 88 mg/dL (ref 70–99)

## 2019-07-15 LAB — HEPATIC FUNCTION PANEL
ALT: 739 U/L — ABNORMAL HIGH (ref 0–44)
AST: 209 U/L — ABNORMAL HIGH (ref 15–41)
Albumin: 2.3 g/dL — ABNORMAL LOW (ref 3.5–5.0)
Alkaline Phosphatase: 130 U/L — ABNORMAL HIGH (ref 38–126)
Bilirubin, Direct: 0.3 mg/dL — ABNORMAL HIGH (ref 0.0–0.2)
Indirect Bilirubin: 0.6 mg/dL (ref 0.3–0.9)
Total Bilirubin: 0.9 mg/dL (ref 0.3–1.2)
Total Protein: 6.3 g/dL — ABNORMAL LOW (ref 6.5–8.1)

## 2019-07-15 LAB — PROTIME-INR
INR: 1.4 — ABNORMAL HIGH (ref 0.8–1.2)
Prothrombin Time: 16.4 seconds — ABNORMAL HIGH (ref 11.4–15.2)

## 2019-07-15 LAB — COOXEMETRY PANEL
Carboxyhemoglobin: 1.1 % (ref 0.5–1.5)
Methemoglobin: 0.7 % (ref 0.0–1.5)
O2 Saturation: 55.2 %
Total hemoglobin: 10.6 g/dL — ABNORMAL LOW (ref 12.0–16.0)

## 2019-07-15 LAB — MAGNESIUM: Magnesium: 2.1 mg/dL (ref 1.7–2.4)

## 2019-07-15 LAB — LACTATE DEHYDROGENASE: LDH: 320 U/L — ABNORMAL HIGH (ref 98–192)

## 2019-07-15 MED ORDER — RANOLAZINE ER 500 MG PO TB12
500.0000 mg | ORAL_TABLET | Freq: Two times a day (BID) | ORAL | Status: DC
  Administered 2019-07-15 – 2019-07-24 (×19): 500 mg via ORAL
  Filled 2019-07-15 (×19): qty 1

## 2019-07-15 MED ORDER — INSULIN ASPART 100 UNIT/ML ~~LOC~~ SOLN
0.0000 [IU] | Freq: Three times a day (TID) | SUBCUTANEOUS | Status: DC
  Administered 2019-07-15: 1 [IU] via SUBCUTANEOUS
  Administered 2019-07-15: 3 [IU] via SUBCUTANEOUS
  Administered 2019-07-16 – 2019-07-19 (×2): 2 [IU] via SUBCUTANEOUS
  Administered 2019-07-22 (×2): 1 [IU] via SUBCUTANEOUS

## 2019-07-15 MED ORDER — ENSURE ENLIVE PO LIQD
237.0000 mL | Freq: Two times a day (BID) | ORAL | Status: DC
  Administered 2019-07-15 – 2019-07-16 (×3): 237 mL via ORAL

## 2019-07-15 MED ORDER — WARFARIN - PHARMACIST DOSING INPATIENT: Freq: Every day | Status: DC

## 2019-07-15 MED ORDER — INSULIN ASPART 100 UNIT/ML ~~LOC~~ SOLN: 0.0000 [IU] | Freq: Every day | SUBCUTANEOUS | Status: DC

## 2019-07-15 MED ORDER — MIDODRINE HCL 5 MG PO TABS
2.5000 mg | ORAL_TABLET | Freq: Three times a day (TID) | ORAL | Status: DC
  Administered 2019-07-15 – 2019-07-17 (×6): 2.5 mg via ORAL
  Filled 2019-07-15 (×6): qty 1

## 2019-07-15 MED ORDER — WARFARIN SODIUM 1 MG PO TABS
1.0000 mg | ORAL_TABLET | Freq: Once | ORAL | Status: AC
  Administered 2019-07-15: 1 mg via ORAL
  Filled 2019-07-15: qty 1

## 2019-07-15 NOTE — Progress Notes (Signed)
Patient refused CPAP for the night. Patient wearing oxygen set at 4lpm with Sp02=97%. Will continue to monitor.

## 2019-07-15 NOTE — Progress Notes (Signed)
NAME:  Aaron [name], MRN:  [id number], DOB:  June 19, 1965, LOS: 44 ADMISSION DATE:  06/17/2019, CONSULTATION DATE: 07/09/2019 REFERRING MD: Haroldine Laws, CHIEF COMPLAINT: Acute hypoxic respiratory distress  Brief History   54 year old male admitted as a code STEMI  History of present illness   Aaron [name] is a 54 year old male with a past medical history significant for systolic congestive heart failure, ischemic cardiomyopathy, tobacco abuse, and hypertension who presented 6/16 with acute of sudden onset chest pain and abdominal discomfort.  Work-up at outside urgent care revealed STEMI with diffuse ST segment depression and 9/10 chest pain. Patient was transported to [facility] emergency department.   On arrival patient was seen hemodynamically stable.  EKG on arrival consistent with ST depression in inferior lateral leads and Q waves.  Lab work on arrival revealed high-sensitivity troponin of 202, BNP 751, K3.9, glucose 168, creatinine 0.95, WBC 12.5, hemoglobin 16.6, and platelets 177 he received a CT angio to rule out dissection which was negative after which he was urgently taken to Cath Lab for further evaluation.  Cardiac catheterization revealed multivessel CAD, attempt was made to open the LAD but wire would not pass.  At that time intra-aortic balloon pump and Swan scan catheter was placed.   Since admission patient was evaluated by Dr. Julien Girt for possible CABG but unfortunately patient was deemed inappropriate for surgery or percutaneous revascularization due to poor targets.  Therefore, patient was considered for LVAD placement due to severely depressed LV function and little coronary perfusion.   On 06/25/2019 patient had HM III LVAD placed by Dr. Julien Girt under destination therapy criteria due to recent smoking history.  Hospitalization has been complicated by intermittent and frequent V. Fib.  Afternoon of 7/8 patient again went into recurrent V. fib and unfortunately developed  respiratory instability during cardiac arrhythmia resulting in PCCM consultation and intubation.  Patient was given amiodarone bolus and started on drip as well as lidocaine drip.  Post intubation patient was cardioverted at 200 J x 1 with prompt conversion to normal sinus rhythm.  Past Medical History  Systolic congestive heart failure Tobacco abuse Hypertension Ischemic cardiomyopathy  Significant Hospital Events   6/16 admitted  6/24 HM III placed   Consults:  Heart failure Neurology Cardiothoracic surgery Electrophysiology PCCM  Procedures:  7/1 bedside cardioversion due to recurrent V. fib x2 occurrences, converted NSR  7/3 bedside cardioversion due to recurrent V. fib/torsades, converted to NSR 7/6 bedside cardioversion due to recurrent V. fib/torsades, converted to NSR 7/7 bedside cardioversion due to recurrent V. fib x2 occurrences, converted NSR 7/8 intubated by PCCM and underwent bedside cardioversion post intubation, converted to NSR  Significant Diagnostic Tests:  CT angio chest 6/16 > no evidence of aortic aneurysm or dissection, concern for 2.4 cm enhancing lesion within the mid lobe of the right kidney, cannot exclude renal cell carcinoma  CT chest/abdomen/pelvis > severe bilateral pneumonia right greater than left  Abdominal ultrasound 7/8 > minimal dependent sludge within the gallbladder, no other acute findings  Micro Data:  MRSA PCR screening 6/17 > negative Blood culture 7/8>> resp BAL culture 7/8>>  Antimicrobials:  Cefepime 7/8 Vancomycin 7/8  Interim history/subjective:  No events, stable off vent.  Objective   Blood pressure (!) 115/92, pulse (!) 47, temperature 100.2 F (37.9 C), resp. rate (!) 32, height _0  (1.753 m), weight 94.2 kg, SpO2 97 %. PAP: (18-55)/(12-33) 54/30 CVP:  [5 mmHg-22 mmHg] 9 mmHg  Vent Mode: PSV;CPAP FiO2 (%):  [40 %] 40 % Set  Rate:  [20 bmp] 20 bmp Vt Set:  [570 mL] 570 mL PEEP:  [5 cmH20-10 cmH20] 5  cmH20 Pressure Support:  [5 cmH20] 5 cmH20 Plateau Pressure:  [24 cmH20-28 cmH20] 28 cmH20   Intake/Output Summary (Last 24 hours) at 07/15/2019 1016 Last data filed at 07/15/2019 1000 Gross per 24 hour  Intake 2987.29 ml  Output 5005 ml  Net -2017.71 ml  net for admission -1.8L  Filed Weights   07/13/19 0440 07/14/19 0500 07/15/19 0400  Weight: 93.9 kg 94.3 kg 94.2 kg    Examination: GEN: no acute distress HEENT: MMM CV: LVAD hum, ext warm PULM: Diminished bases, triggering vent GI: Soft, +BS EXT: Trace anasarca NEURO: Moves all 4 ext to command PSYCH: RASS 0  SKIN: No rashes  Slowly improving shock liver LDH stable Sugars okay WBC up slightly H/H, plts stable  Resolved Hospital Problem list     Assessment & Plan:  Acute hypoxic respiratory failure  -In the setting of tachyarrhythmia due to recurrent V. Tach/  V. fib resulting in decompensation History of sleep apnea Bilateral HAP leading to acute decompensation requiring mechanical ventilation - Extubated successfully, work on IS, mobility - Would do CPAP at night if he's willing  Bilateral HAP vs. Aspiration- culture negative Sepsis -7 days cefepime reasonable, end date today  Acute anterior lateral STEMI Multivessel CAD Cardiogenic shock requiring LVAD- HM III  Recurrent VT, likely due to acute sepsis.  Status post multiple DCCV.  Improved since IABP and mechanical ventilation. -Continue amiodarone, mexiletine, digoxin per EP. - On heparin drip, transition back to coumadin per HF team -Continue diuresis as tolerated by renal function: per heart failure team  Small right renal mass, concerning for RCC Incidental finding of 2.5 cm right mid lateral enhancing solid mass incidentally, MRI obtained 6/20 Given acute cardiovascular comorbidities surveillance was preferred -Will need to follow-up with urology at discharge  Shock liver- improving. -Continue to trend  Constipation- resolved, continue current  bowel regimen  Given patient stability off vent, will sign off, call if we can be of further help.  Erskine Emery MD PCCM

## 2019-07-15 NOTE — Progress Notes (Signed)
Occupational Therapy Treatment Patient Details Name: Aaron [name] MRN: [id number] DOB: Mar 03, 1965 Today's Date: 07/15/2019    History of present illness Pt is 54yo admitted 6/16 with STEMI with multivessel disease. cath with no viable targets for CABG. Pt s/p HeartMate 3 LVAD implantation 6/24. VT 2x on 7/1 s/p DCCV. 7/7 pt with torsades. 7/8 pt intubated, bronchoscopy with repeat Vtach s/p cardioversion and IABP placed. IABP removed 7/13 and extubated 7/13.  PMHx: HTN, tobacco use (30pack yrs)   OT comments  Pt seen post extubation and removal of IABP on 7/13; now presenting with increased weakness and unsteadiness in standing given current medical status. Pt tolerating OOB to chair during today's session with two person HHA, requiring up to [place] for LB ADL. VSS throughout. Given recent change in medical status and pt's current level of assist have updated d/c recommendations - feel he will benefit from CIR level therapies at time of discharge to progress his overall safety and independence with ADL and mobility prior to returning home. Acute OT to continue to follow, goal date updated as current acute care goals remain appropriate.    Follow Up Recommendations  CIR    Equipment Recommendations  3 in 1 bedside commode (TBD in next venue)    Recommendations for Other Services Rehab consult    Precautions / Restrictions Precautions Precautions: Fall;Sternal;Other (comment) Precaution Comments: LVAD Restrictions Weight Bearing Restrictions: No       Mobility Bed Mobility Overal bed mobility: Needs Assistance Bed Mobility: Supine to Sit Rolling: Min assist;+2 for safety/equipment   Supine to sit: Min assist     General bed mobility comments: Assist to elevate trunk into sitting and to bring hips to EOB.   Transfers Overall transfer level: Needs assistance Equipment used: 2 person hand held assist Transfers: Sit to/from [facility] Sit to Stand: +2 physical  assistance;Min assist Stand pivot transfers: +2 physical assistance;Min assist       General transfer comment: Assist to bring hips up and for balance. Very small pivotal steps for bed to chair transfer. Assist with balance and support.     Balance Overall balance assessment: Needs assistance Sitting-balance support: No upper extremity supported;Feet supported Sitting balance-Leahy Scale: Fair     Standing balance support: Bilateral upper extremity supported Standing balance-Leahy Scale: Poor Standing balance comment: +2 hand held assist for static standing                           ADL either performed or assessed with clinical judgement   ADL Overall ADL's : Needs assistance/impaired Eating/Feeding: Set up;Sitting Eating/Feeding Details (indicate cue type and reason): setup with lunch tray end of session Grooming: Wash/dry face;Set up;Supervision/safety;Sitting Grooming Details (indicate cue type and reason): seated in recliner              Lower Body Dressing: Total assistance;Sit to/from stand;Bed level Lower Body Dressing Details (indicate cue type and reason): donning socks             Functional mobility during ADLs: Minimal assistance;+2 for physical assistance;+2 for safety/equipment (HHA) General ADL Comments: pt tolerating OOB to chair s/p extubation on 7/13 and removal of IABP. pt now with increased weakness and unsteadiness with mobility given recent medical changes                        Cognition Arousal/Alertness: Awake/alert Behavior During Therapy: Flat affect Overall Cognitive Status: Within Functional Limits  for tasks assessed                                 General Comments: slow to respond        Exercises     Shoulder Instructions       General Comments      Pertinent Vitals/ Pain       Pain Assessment: No/denies pain  Home Living                                          Prior  Functioning/Environment              Frequency  Min 2X/week        Progress Toward Goals  OT Goals(current goals can now be found in the care plan section)  Progress towards OT goals: Progressing toward goals  Acute Rehab OT Goals Patient Stated Goal: to get back home to his dog "Aaron [name]" OT Goal Formulation: With patient Time For Goal Achievement: 07/21/2019 Potential to Achieve Goals: Good ADL Goals Pt Will Perform Lower Body Bathing: with modified independence;sit to/from stand Pt Will Perform Lower Body Dressing: with modified independence;sit to/from stand Pt Will Transfer to Toilet: with modified independence;ambulating;bedside commode Pt Will Perform Toileting - Clothing Manipulation and hygiene: with modified independence;sit to/from stand Additional ADL Goal #1: Pt will independently demonstrate ability to switch power sources for ADL and mobility Additional ADL Goal #2: Pt will independently donn/doff vest/carrying device for battery packs  Plan Discharge plan needs to be updated    Co-evaluation                 AM-PAC OT "6 Clicks" Daily Activity     Outcome Measure   Help from another person eating meals?: None Help from another person taking care of personal grooming?: A Little Help from another person toileting, which includes using toliet, bedpan, or urinal?: A Lot Help from another person bathing (including washing, rinsing, drying)?: A Lot Help from another person to put on and taking off regular upper body clothing?: A Little Help from another person to put on and taking off regular lower body clothing?: A Lot 6 Click Score: 16    End of Session Equipment Utilized During Treatment: Oxygen  OT Visit Diagnosis: Unsteadiness on feet (R26.81);Muscle weakness (generalized) (M62.81)   Activity Tolerance Patient tolerated treatment well   Patient Left in chair;with call bell/phone within reach;with nursing/sitter in room   Nurse Communication  Mobility status        Time: 1130-1150 OT Time Calculation (min): 20 min  Charges: OT General Charges $OT Visit: 1 Visit OT Treatments $Self Care/Home Management : 8-22 mins  Lou Cal, OT Acute Rehabilitation Services Pager [phone] Office [place] 07/15/2019, 1:46 PM

## 2019-07-15 NOTE — Progress Notes (Addendum)
Patient ID: Aaron [name], male   DOB: 02/25/1965, 54 y.o.   MRN: [id number]     Advanced Heart Failure Rounding Note  PCP-Cardiologist: No primary care provider on file.   Subjective:    - HM3 placement 6/24  - Had 2 episodes of VF requiring emergent DC-CV on 07/02/19 - Recurrent VF on 7/2 and broke spontaneously.  - 7/3 Recurrent VF at 2a.-> emergent DC-CV.  - 07/07/2019 VT/Torsades -> repeat DC_CV - 07/08/2019 VF/Torsades x2--> DC-CV x2.  - 07/09/19 developed sepsis/PNA -> intubated - 07/09/19 Multiple episodes of VT/VF -> IABP placed. AV pacing via EPW - 07/14/19 IABP removed and extubated. Pacing discontinued.  Sitting up in bed. Feels ok.   Remains on amio drip 30 mg per hour + mexiletine. No further VT on tele.  No further bradycardia. NSR, HR 60s-70s.    Co-ox 55%. Unable to get thermodilution CI. MAPs in the 80s.   CVP 16. -3.8L in UOP yesterday. SCr improving, 1.41>>1.30.   INR 1.4, on heparin gtt.  Swan #s CVP 16 PA 44/23 (30)  Co-ox 55% Unable to obtain CI/CO numbers by thermo   LVAD Interrogation HM III: Speed: 5200 Flow: 4.2  PI: 3.8 Power: 3.6.  No PI events.   Objective:   Weight Range: 94.2 kg Body mass index is 30.67 kg/m.   Vital Signs:   Temp:  [98.1 F (36.7 C)-99.9 F (37.7 C)] 99.5 F (37.5 C) (07/14 0400) Pulse Rate:  [25-158] 64 (07/14 0400) Resp:  [12-33] 24 (07/14 0400) BP: (81-131)/(63-79) 95/78 (07/14 0400) SpO2:  [77 %-100 %] 93 % (07/14 0400) Arterial Line BP: (79-105)/(45-85) 95/78 (07/14 0400) FiO2 (%):  [40 %] 40 % (07/13 1600) Weight:  [94.2 kg] 94.2 kg (07/14 0400) Last BM Date: 07/15/19  Weight change: Filed Weights   07/13/19 0440 07/14/19 0500 07/15/19 0400  Weight: 93.9 kg 94.3 kg 94.2 kg    Intake/Output:   Intake/Output Summary (Last 24 hours) at 07/15/2019 0715 Last data filed at 07/15/2019 0500 Gross per 24 hour  Intake 2573.36 ml  Output 4855 ml  Net -2281.64 ml      Physical Exam   CVP 16 General: fatigue  appearing, no distress Neck: JVP elevated to jaw line. no thyromegaly or thyroid nodule. + Rt IJ Swan  Lungs: clear bilaterally  CV: + LVAD Sounds  Abdomen:  soft, nontender, non distended, no hepatosplenomegaly  Skin: Intact without lesions or rashes.  Neurologic: awake and alert. Moves all 4 extremities  Extremities: No clubbing or cyanosis.  HEENT: Normal.   Telemetry   NSR 60s-70s Personally reviewed  Labs    CBC Recent Labs    07/14/19 0425 07/14/19 0425 07/14/19 0507 07/15/19 0440  WBC 8.0  --   --  12.1*  HGB 9.7*   < > 9.5* 10.1*  HCT 31.6*   < > 28.0* 33.1*  MCV 98.8  --   --  99.4  PLT 236  --   --  214   < > = values in this interval not displayed.   Basic Metabolic Panel Recent Labs    07/13/19 0408 07/13/19 0602 07/13/19 1307 07/13/19 1722 07/14/19 0425 07/14/19 0507 07/14/19 1459 07/15/19 0440  NA  --    < >   < >  --  145   < > 147* 143  K 3.2*   < >   < >  --  3.2*   < > 3.7 3.6  CL  --    < >   < >  --  115*   < > 117* 111  CO2  --    < >   < >  --  19*   < > 19* 20*  GLUCOSE  --    < >   < >  --  148*   < > 123* 89  BUN  --    < >   < >  --  32*   < > 33* 27*  CREATININE  --    < >   < >  --  1.34*   < > 1.41* 1.30*  CALCIUM  --    < >   < >  --  8.1*   < > 8.3* 8.2*  MG 2.3   < >  --   --  2.3  --   --  2.1  PHOS 4.4  --   --  4.0  --   --   --   --    < > = values in this interval not displayed.   Liver Function Tests Recent Labs    07/14/19 0425 07/15/19 0440  AST 269* 209*  ALT 894* 739*  ALKPHOS 131* 130*  BILITOT 1.1 0.9  PROT 6.0* 6.3*  ALBUMIN 2.1* 2.3*   No results for input(s): LIPASE, AMYLASE in the last 72 hours. Cardiac Enzymes No results for input(s): CKTOTAL, CKMB, CKMBINDEX, TROPONINI in the last 72 hours.  BNP: BNP (last 3 results) Recent Labs    06/26/19 0445 07/02/19 0531 07/09/19 0313  BNP 1,301.1* 1,646.4* 1,815.2*    ProBNP (last 3 results) No results for input(s): PROBNP in the last 8760  hours.   D-Dimer No results for input(s): DDIMER in the last 72 hours. Hemoglobin A1C No results for input(s): HGBA1C in the last 72 hours. Fasting Lipid Panel No results for input(s): CHOL, HDL, LDLCALC, TRIG, CHOLHDL, LDLDIRECT in the last 72 hours. Thyroid Function Tests No results for input(s): TSH, T4TOTAL, T3FREE, THYROIDAB in the last 72 hours.  Invalid input(s): FREET3  Other results:   Imaging    No results found.   Medications:     Scheduled Medications: . aspirin  81 mg Oral Daily  . atorvastatin  80 mg Oral Daily  . bisacodyl  10 mg Oral Daily   Or  . bisacodyl  10 mg Rectal Daily  . chlorhexidine  15 mL Mouth Rinse BID  . Chlorhexidine Gluconate Cloth  6 each Topical Daily  . clonazePAM  0.5 mg Oral BID  . colchicine  0.6 mg Oral BID  . digoxin  0.125 mg Oral Daily  . docusate sodium  100 mg Oral Daily  . folic acid  1 mg Oral Daily  . furosemide  60 mg Intravenous BID  . gabapentin  300 mg Oral TID  . insulin aspart  0-15 Units Subcutaneous Q4H  . mouth rinse  15 mL Mouth Rinse q12n4p  . melatonin  5 mg Oral QPC supper  . metoCLOPramide (REGLAN) injection  10 mg Intravenous Q6H  . mexiletine  150 mg Oral Q12H  . midodrine  5 mg Oral TID WC  . multivitamin with minerals  1 tablet Oral Daily  . pantoprazole  40 mg Oral Daily  . polyethylene glycol  17 g Oral Daily  . potassium chloride  40 mEq Oral BID  . sodium chloride flush  10-40 mL Intracatheter Q12H  . sodium chloride flush  3 mL Intravenous Q12H  . sodium chloride flush  3 mL Intravenous Q12H  .  thiamine  100 mg Oral Daily    Infusions: . sodium chloride    . sodium chloride Stopped (07/13/19 0432)  . sodium chloride Stopped (07/15/19 0129)  . amiodarone 30 mg/hr (07/15/19 0500)  . amiodarone    . ceFEPime (MAXIPIME) IV Stopped (07/15/19 0041)  . dexmedetomidine (PRECEDEX) IV infusion Stopped (07/14/19 1522)  . fentaNYL infusion INTRAVENOUS Stopped (07/14/19 1601)  . heparin 500  Units/hr (07/15/19 0500)  . lactated ringers    . vasopressin Stopped (07/10/19 1056)    PRN Medications: Place/Maintain arterial line **AND** sodium chloride, sodium chloride, acetaminophen, ALPRAZolam, bisacodyl, calcium carbonate, dextrose, fentaNYL (SUBLIMAZE) injection, ondansetron (ZOFRAN) IV, oxyCODONE, pneumococcal 23 valent vaccine, polyvinyl alcohol, sodium chloride flush, sodium chloride flush, traMADol, traZODone    Patient Profile   Mahmood Boehringer Marcil --post MI cardiogenic shock/ optimization prior to anticipated CABG, at the request of Dr. Marlou Porch, Cardiology.    Assessment/Plan   1. Refractory VT/VF  - developed VF on 7/1 x 2 requiring emergent DC-CV - On 7/2 had self-terminating episode - On 7/3 persistent VF/torsades -> DC-CV - On 7/6  VF/torsades.  - On 7/7 VF /Torsades on 2 separate occasions-> DC-CV x2.  - On 7/8 multiple episodes on VT/VF (incessant) -> unclear which intervention broke it - off lidocaine. Remains on amiodarone 30/hr + mexiletine. No further VT.   - supplement K (3.6). Mg 2.1  - keep K> 4.0 Mg >2.0 - VAD speed initially reduced to 5000 but output low. Now at 5200 (unable to go higher due to suction events). Has had low speeds dropping to 4850.  - Keep off inotropes if possible with VT.   2. Acute Anterolateral STEMI/ Multivessel CAD - Emergent cath showed severe multivessel disease w/ 75% mid- distal LM, 100% prox LAD, not amendable to PCI (unable to cross w/ guide wire, probable CTO), 100% prox LCx (probable CTO) and 100% ostial RCA (probable CTO). There were mild to moderate collaterals right to left to the LAD, minimal right to right collaterals an minimal left to left collaterals. LVEF 25% - Hs trop peaked 27,000. - No targets for CABG / PCI.   - S/P HMIII VAD - suspect VF is ischemically-mediated  3.  Acute Systolic Heart Failure --->Cardiogenic Shock - 2/2 acute MI. LVEF by LVG 25% - IABP out 6/18 but replaced 6/23. Removed in OR on 6/24   - HMIII LVAD  Placed 6/24 - Extubated 6/25  - Was on milrinone for RV failure. Now off due to VF.  - IABP removed 7/13 - Co-ox 55%  - On midodrine 5 mg tid, MAP 80s.  - Continue digoxin, dig level ok    - CVP 16 today, good diuresis yesterday, -3.8L out. Continue IV Lasix 60 mg bid  - remove swan today. Keep PICC  - LDH 490-->1752 -> 2600 -> 548 -> 447->375->338->320 improving, suspect rise due to septic shock. No evidence pump thrombosis  - Ramp echo 7/8 RV only mildly reduced. Good cannula position. Increase VAD speed as tolerated, currently 5200.  - VAD interrogated personally.   4. Acute hypoxic respiratory failure - intubated 7/8. Extubated 7/13 - Chest CT with bilateral PNA R>L - CCM following - completed abx therapy w/ vanc/cefipime - PCT 1.97 => 1.3 => 0.6.  - BCx and BAL NGTD  5. Shock liver - LFTs trending down.  - follow  6. Tobacco Abuse - 30 pack years  - smoking cessation advised  - Will need to quit if to be considered for heart transplant  in the future   7. Renal mass = RCC - MRI abdomen- 1.8 cm right renal lesion, most consistent with renal cell carcinoma. No evidence of renal vein involvement or nodal metastasis. - Discussed w/ Urology and R RCC is small and will just require surveillance.  Ok to proceed with VAD from their standpoint.   8. Hyponatremia  - Na 143 today   9. Sleep Apnea - frequent apnea at night  - needs formal sleep study   - CPAP ordered while inpatient  10. Anemia - Post-op, stable 10.1 - INR 1.4  11. FEN - off TFs post extubation  - advancing diet    Lyda Jester, PA-C  07/15/2019 7:15 AM  Patient seen with PA, agree with the above note.   IABP out.  Co-ox 55%.    NSR in 60s, not pacing. No VT.   Good diuresis yesterday, I/Os negative. CVP remains 16.   Extubated and alert.   General: Well appearing this am. NAD.  HEENT: Normal. Neck: Supple, JVP 12 cm. Carotids OK.  Cardiac:  Mechanical heart  sounds with LVAD hum present.  Lungs:  CTAB, normal effort.  Abdomen:  NT, ND, no HSM. No bruits or masses. +BS  LVAD exit site: Well-healed and incorporated. Dressing dry and intact. No erythema or drainage. Stabilization device present and accurately applied. Driveline dressing changed daily per sterile technique. Extremities:  Warm and dry. No cyanosis, clubbing, rash, or edema.  Neuro:  Alert & oriented x 3. Cranial nerves grossly intact. Moves all 4 extremities w/o difficulty. Affect pleasant    Now off vancomycin, complete 7 days cefepime for possible HCAP. Afebrile.   No further VT. Remains on amiodarone and mexiletine. Can restart ranolazine.   INR1.4, on heparin gtt.   Still with volume overload.  Continue Lasix 60 mg IV bid today with K replacement.  Good diuresis on this regimen yesterday.   MAP in 70s-80s, only on midodrine at this point.  Remove Swan and arterial line.   Mobilize with PT.   CRITICAL CARE Performed by: Loralie Champagne  Total critical care time: 35 minutes  Critical care time was exclusive of separately billable procedures and treating other patients.  Critical care was necessary to treat or prevent imminent or life-threatening deterioration.  Critical care was time spent personally by me on the following activities: development of treatment plan with patient and/or surrogate as well as nursing, discussions with consultants, evaluation of patient's response to treatment, examination of patient, obtaining history from patient or surrogate, ordering and performing treatments and interventions, ordering and review of laboratory studies, ordering and review of radiographic studies, pulse oximetry and re-evaluation of patient's condition.  Loralie Champagne 07/15/2019 8:18 AM

## 2019-07-15 NOTE — Progress Notes (Signed)
LVAD Coordinator Rounding Note:  Transferred from [facility] ED on 06/17/19 with STEMI. Cath revealed multi-vessel CAD, wire would not cross to open LAD; IABP placed and pt transferred here for evaluation for CABG.   Dr. Orvan Seen evaluated and found he was not amenable to surgical or percutaneous revascularization due to poor targets. With severe depressed LV function and little coronary perfusion, LVAD was recommended.   HM III LVAD implanted on 06/25/19 by Dr. Orvan Seen under Destination Therapy criteria due to recent smoking history.  Developed sepsis/PNA with respiratory failure. Had refractory VT/VF. IABP/swan placed 07/09/19 - possible d/c today if repeat INR this afternoon is 2.5 or less.  Pt awake, lying in bed this am, denies complaints. IABP dc'd yesterday with no reported ventricular arrhythmias. Pt extubated yesterday as well.   Vital signs: Temp: 100.2 HR: 70 AV paced Art Line: 109/90 (99) Doppler: 102 O2 Sat: 97% on 6 L/[place] Wt: 207>225.7>222.6>214.3>213.4>203.9>205.3>209>212......207>207.9>207.6 lbs  LVAD parameters: Speed:  5200 Flow: 3.5 Power:  3.6w PI: 6.0 Hct: 33  Alarms: none Events: >120 today  Fixed speed: 5200 Low speed limit: 4800   Drive Line: CDI.  Drive line anchor secure. Every other day dressing changes per VAD Coordinator, Nurse Davonna Belling, or trained caregiver. Existing VAD dressing with small amount yellow drainage noted.  Drive line anchor secure. Existing VAD dressing removed and site care performed using sterile technique. Drive line exit site cleaned with Chlora prep applicators x 2, allowed to dry, and gauze dressing with silver strip re-applied. Exit site unincorporated, erythema around exit site with slight improvement since Monday, small amount yellow/tan drainage with not foul odor. The velour is fully implanted at exit site. Anchor re-applied around drive line. No family at bedside.    Labs:  LDH trend:  660>398>347>459>462>410>554>383>490>1752>2605>548>447>375>338>320  INR trend: 1.4>1.3>1.1>1.2>1.4>1.5>1.7>2.7>3.6>5.9>4.1>4.7>4.7>4.2>3.8>1.6>1.4  WBC trend: 22.9>19.2>13.5>12.3>10.1>8.0>12.1  AST/ALT: 1854/1377>3004/2497>1441/2080>>686/1554>403/1162> 502/774>128/786   Anticoagulation Plan: -INR Goal:  2.0 - 2.5 -ASA Dose: 81 mg   Blood Products:  -Intra-op - 06/25/19>>6 units FFP  - 07/09/19>>2 FFP - 07/10/19>>2 FFP  Device: N/A  Drips: Amiodarone>>30mg /hr Heparin>>500 u/hr  Tube Feeding: stopped 07/14/19  Arrythmias: was on Amiodarone pre implant for NSVT - 2 episodes of VF requiring emergent DC-CV on 07/02/19 - Recurrent VF on 7/2 and broke spontaneously.  - 7/3 Recurrent VF at 2a.-> emergent DC-CV.  - 07/07/2019 VT/Torsades -> repeat DC_CV - 07/08/2019 VF/Torsades x2--> DC-CV x2.  - 07/09/19 Multiple episodes of VT/VF -> IABP placed. AV pacing via external pacing wires - underlying rhythm bradycardia 40 bpm; external AV pacing wires remain intact  Respiratory: extubated 06/26/19 - re-intubated 07/09/19 for sepsis/pneumoni - extubated 07/14/19   Nitric Oxide: off 06/26/19  - Restarted 07/10/19 - Off 07/14/19  VAD Education: 1. Sister-Joanne is checked off to perform driveline dressing changes independently.  2. Reviewed changing power sources from batteries to patient cable. Reviewed system controller test. 3. Physical therapy to start working with patient since he has been extubated. 4. Delivered HM III Patient Handbook to room and asked patient to start reading.   Plan/Recommendations:  1. Call VAD pager if any VAD equipment or drive line issues. 2. Every other day dressing changes per VAD Coordinator, Nurse Davonna Belling, or pts sister.   Zada Girt RN [place] Coordinator  Office: [phone]  24/7 Pager: [phone]

## 2019-07-15 NOTE — Progress Notes (Signed)
6 Days Post-Op Procedure(s) (LRB): RIGHT HEART CATH (N/A) IABP Insertion (N/A) Subjective: Patient feels much better extubated, breathing comfortably Hemodynamics stable and PA catheter to be removed No more ventricular arrhythmias so patient is safe to be assisted to chair.  Physical therapy called back to assist. Objective: Vital signs in last 24 hours: Temp:  [95.5 F (35.3 C)-100 F (37.8 C)] 100 F (37.8 C) (07/14 0930) Pulse Rate:  [25-158] 66 (07/14 0930) Cardiac Rhythm: Normal sinus rhythm;Heart block;Bundle branch block (07/14 0800) Resp:  [16-38] 32 (07/14 0930) BP: (81-95)/(63-79) 95/78 (07/14 0400) SpO2:  [77 %-100 %] 92 % (07/14 0930) Arterial Line BP: (79-105)/(45-90) 94/80 (07/14 0930) FiO2 (%):  [40 %] 40 % (07/13 1600) Weight:  [94.2 kg] 94.2 kg (07/14 0400)  Hemodynamic parameters for last 24 hours: PAP: (18-55)/(12-33) 44/22 CVP:  [5 mmHg-22 mmHg] 9 mmHg  Intake/Output from previous day: 07/13 0701 - 07/14 0700 In: 2856.7 [P.O.:1320; I.V.:836.7; NG/GT:400; IV Piggyback:300.1] Out: 4855 [Urine:4575; Stool:280] Intake/Output this shift: Total I/O In: 500.7 [P.O.:360; I.V.:43.3; IV Piggyback:97.4] Out: 375 [Urine:375]  Alert and responsive Lungs clear Sternal incision well-healed Normal VAD hum  Lab Results: Recent Labs    07/14/19 0425 07/14/19 0425 07/14/19 0507 07/15/19 0440  WBC 8.0  --   --  12.1*  HGB 9.7*   < > 9.5* 10.1*  HCT 31.6*   < > 28.0* 33.1*  PLT 236  --   --  214   < > = values in this interval not displayed.   BMET:  Recent Labs    07/14/19 1459 07/15/19 0440  NA 147* 143  K 3.7 3.6  CL 117* 111  CO2 19* 20*  GLUCOSE 123* 89  BUN 33* 27*  CREATININE 1.41* 1.30*  CALCIUM 8.3* 8.2*    PT/INR:  Recent Labs    07/15/19 0440  LABPROT 16.4*  INR 1.4*   ABG    Component Value Date/Time   PHART 7.458 (H) 07/14/2019 0507   HCO3 21.2 07/14/2019 0507   TCO2 22 07/14/2019 0507   ACIDBASEDEF 2.0 07/14/2019 0507    O2SAT 55.2 07/15/2019 0440   CBG (last 3)  Recent Labs    07/15/19 0015 07/15/19 0438 07/15/19 0700  GLUCAP 149* 88 129*    Assessment/Plan: S/P Procedure(s) (LRB): RIGHT HEART CATH (N/A) IABP Insertion (N/A) Mobilize OOB with PT  Cont coumadin per pharmD   LOS: 28 days    Aaron [name] 07/15/2019

## 2019-07-15 NOTE — Progress Notes (Signed)
Patient ID: Aaron [name], male   DOB: 1965/02/11, 54 y.o.   MRN: [id number]    This NP visited patient at the bedside as a follow up for palliative medicine needs and emotional support.  Patient is s/p HM3 placement 6/24.  The was extubated yesterday.  He is alert and oriented and verbalizes he is "doing pretty good".  He remains weak, he is hopeful for continued progress and improvement.   Stayed with patient while Jilda Roche, LVAD coordinator at bedside completing dressing change.  [name]'s mood is positive.  Continued emotional and motivational support  Spoke to his sister by telephone to offer emotional support, and answer questions or concerns.  She verbalizes questions regarding her brother's disability paperwork.   I spoke with bedside nurse and she will convey information to LVAD coordinator  PMT will continue to support holistically  Total time spent on the unit was 15 minutes.  Greater than 50% of the time was spent in counseling and coordination of care  Wadie Lessen NP  Palliative Medicine Team Team Phone # [phone] Pager [phone]

## 2019-07-15 NOTE — Progress Notes (Signed)
Pharmacy Antibiotic Note  Aaron [name] is a 54 y.o. male admitted on 06/17/2019 with sepsis.  Pharmacy has been consulted for cefepime dosing for WBC increased and cardiogenic/septic shock - bilateral HAP requiring mechanical ventilation.   Cultures remain negative. Remains afebrile, WBC stable at 12.1. Renal function slowly improving, Scr 1.3 today.   Plan: Continue Cefepime 2g Q8 hrs for total 7 days (through 7/14) Monitor renal function, clinical progression  Height: _0  (175.3 cm) Weight: 94.2 kg (207 lb 10.8 oz) IBW/kg (Calculated) : 70.7  Temp (24hrs), Avg:99.1 F (37.3 C), Min:95.5 F (35.3 C), Max:100.2 F (37.9 C)  Recent Labs  Lab 07/11/19 0327 07/11/19 1523 07/12/19 0342 07/12/19 1616 07/13/19 0408 07/13/19 0902 07/13/19 1307 07/14/19 0425 07/14/19 1459 07/15/19 0440  WBC 13.5*  --  12.3*  --  10.1  --   --  8.0  --  12.1*  CREATININE 1.59*   < > 1.44*   < >  --  1.43* 1.47* 1.34* 1.41* 1.30*   < > = values in this interval not displayed.    Estimated Creatinine Clearance: 72.7 mL/min (A) (by C-G formula based on SCr of 1.3 mg/dL (H)).    Allergies  Allergen Reactions  . Codeine Hives    Antimicrobials this admission: Vancomycin 07/09/19 >> 7/11 Cefepime 07/09/19 >> (7/14)  Dose adjustments this admission: N/A  Microbiology results: Bcx 7/8: neg BAL 7/8: normal flora  Richardine Service, PharmD PGY2 Cardiology Pharmacy Resident Phone: [phone] 07/15/2019  12:56 PM  Please check [email or web address] for unit-specific pharmacy phone numbers.

## 2019-07-15 NOTE — Progress Notes (Signed)
Patient was on fentanyl gtt that has been d/c'd. 225 mL fentanyl wasted with Warden Fillers RN in [facility] med room Stericycle.

## 2019-07-15 NOTE — Progress Notes (Signed)
Rehab Admissions Coordinator Note:  Per PT and OT recommendation, this patient was screened by Raechel Ache for appropriateness for an Inpatient Acute Rehab Consult.  At this time, we are recommending an Inpatient Rehab consult. AC will contact MD to request order.   Raechel Ache 07/15/2019, 6:00 PM  I can be reached at [phone].

## 2019-07-15 NOTE — Progress Notes (Signed)
Physical Therapy Treatment Patient Details Name: Aaron [name] MRN: [id number] DOB: Feb 04, 1965 Today's Date: 07/15/2019    History of Present Illness Pt is 54yo admitted 6/16 with STEMI with multivessel disease. cath with no viable targets for CABG. Pt s/p HeartMate 3 LVAD implantation 6/24. VT 2x on 7/1 s/p DCCV. 7/7 pt with torsades. 7/8 pt intubated, bronchoscopy with repeat Vtach s/p cardioversion and IABP placed. IABP removed 7/13 and extubated 7/13.  PMHx: HTN, tobacco use (30pack yrs)    PT Comments    Pt weaker since multiple medical complications. At this time recommend CIR for further rehab.    Follow Up Recommendations  CIR     Equipment Recommendations  Other (comment) (rollator)    Recommendations for Other Services       Precautions / Restrictions Precautions Precautions: Fall;Sternal;Other (comment) Precaution Comments: LVAD Restrictions Weight Bearing Restrictions: No    Mobility  Bed Mobility Overal bed mobility: Needs Assistance Bed Mobility: Supine to Sit Rolling: Min assist;+2 for safety/equipment   Supine to sit: Min assist     General bed mobility comments: Assist to elevate trunk into sitting and to bring hips to EOB.   Transfers Overall transfer level: Needs assistance Equipment used: 2 person hand held assist Transfers: Sit to/from [facility] Sit to Stand: +2 physical assistance;Min assist Stand pivot transfers: +2 physical assistance;Min assist       General transfer comment: Assist to bring hips up and for balance. Very small pivotal steps for bed to chair transfer. Assist with balance and support.   Ambulation/Gait                 Stairs             Wheelchair Mobility    Modified Rankin (Stroke Patients Only)       Balance Overall balance assessment: Needs assistance Sitting-balance support: No upper extremity supported;Feet supported Sitting balance-Leahy Scale: Fair     Standing balance  support: Bilateral upper extremity supported Standing balance-Leahy Scale: Poor Standing balance comment: +2 hand held assist for static standing                            Cognition Arousal/Alertness: Awake/alert Behavior During Therapy: Flat affect Overall Cognitive Status: Within Functional Limits for tasks assessed                                        Exercises      General Comments        Pertinent Vitals/Pain Pain Assessment: No/denies pain    Home Living                      Prior Function            PT Goals (current goals can now be found in the care plan section) Acute Rehab PT Goals Patient Stated Goal: to get back home to his dog "[name]" PT Goal Formulation: With patient Time For Goal Achievement: 07/12/2019 Potential to Achieve Goals: Good Progress towards PT goals: Goals downgraded-see care plan    Frequency    Min 3X/week      PT Plan Discharge plan needs to be updated;Frequency needs to be updated    Co-evaluation              AM-PAC PT "6 Clicks" Mobility  Outcome Measure  Help needed turning from your back to your side while in a flat bed without using bedrails?: A Little Help needed moving from lying on your back to sitting on the side of a flat bed without using bedrails?: A Little Help needed moving to and from a bed to a chair (including a wheelchair)?: A Lot Help needed standing up from a chair using your arms (e.g., wheelchair or bedside chair)?: A Little Help needed to walk in hospital room?: A Lot Help needed climbing 3-5 steps with a railing? : Total 6 Click Score: 14    End of Session Equipment Utilized During Treatment: Oxygen Activity Tolerance: Patient limited by fatigue Patient left: in chair;with call bell/phone within reach Nurse Communication: Mobility status PT Visit Diagnosis: Other abnormalities of gait and mobility (R26.89);Muscle weakness (generalized) (M62.81)      Time: 2518-9842 PT Time Calculation (min) (ACUTE ONLY): 19 min  Charges:                        [place] Pager [phone] Office [place] 07/15/2019, 1:24 PM

## 2019-07-15 NOTE — Progress Notes (Signed)
[place] for Coumadin>>heparin Indication: LVAD  Allergies  Allergen Reactions  . Codeine Hives    Patient Measurements: Height: 5\' 9"  (175.3 cm) Weight: 94.2 kg (207 lb 10.8 oz) IBW/kg (Calculated) : 70.7 Heparin Dosing Weight: ~ 92 kg  Vital Signs: Temp: 97.8 F (36.6 C) (07/14 1118) Temp Source: Oral (07/14 1118) BP: 99/82 (07/14 1115) Pulse Rate: 66 (07/14 1115)  Labs: Recent Labs    07/13/19 0408 07/13/19 0602 07/13/19 1227 07/13/19 1307 07/14/19 0425 07/14/19 0425 07/14/19 0507 07/14/19 0514 07/14/19 1459 07/15/19 0440  HGB 9.7*   < >  --   --  9.7*   < > 9.5*  --   --  10.1*  HCT 31.4*   < >  --   --  31.6*  --  28.0*  --   --  33.1*  PLT 249  --   --   --  236  --   --   --   --  214  LABPROT 36.1*   < > 26.8*  --   --   --   --  18.4*  --  16.4*  INR 3.8*   < > 2.6*  --   --   --   --  1.6*  --  1.4*  HEPARINUNFRC  --   --   --   --   --   --   --  <0.10*  --  <0.10*  CREATININE  --    < >  --    < > 1.34*  --   --   --  1.41* 1.30*   < > = values in this interval not displayed.    Estimated Creatinine Clearance: 72.7 mL/min (A) (by C-G formula based on SCr of 1.3 mg/dL (H)).  Assessment: 54 yo male s/p LVAD HM3 placement 6/24.  Pharmacy asked to dose  anticoagulation with IV heparin and Coumadin. No overt bleeding or complications noted.    IABP was pulled and pt extubated on 7/13.   INR has decreased to 1.4 this morning. Patient did not receive warfarin yesterday for unknown reason. Hgb stable at 10.1, plt 214. LDH 320. Heparin was restarted 8 hours after IABP removal at fixed low-dose 500 units/hr infusion. Heparin level is undetectable this AM.  Goal of Therapy:  Heparin level: 0.2-0.5 - currently planned for fixed dose heparin  INR 2-2.5  Monitor platelets by anticoagulation protocol: Yes   Plan:  -Warfarin 1mg  PO x1 today -Continue heparin infusion at 500 units/hr until INR therapeutic -Daily INR, CBC,  LDH, s/sx of bleeding, HL  Richardine Service, PharmD PGY2 Cardiology Pharmacy Resident Phone: [phone] 07/15/2019  12:52 PM  Please check [email or web address] for unit-specific pharmacy phone numbers.

## 2019-07-15 NOTE — Progress Notes (Signed)
Nutrition Follow-up  DOCUMENTATION CODES:   Not applicable  INTERVENTION:   Ensure Enlive po BID, each supplement provides 350 kcal and 20 grams of protein  Liberalize diet to Heart Healthy only; if po inadequate, recommend liberalizing diet further  NUTRITION DIAGNOSIS:   Inadequate oral intake related to poor appetite as evidenced by meal completion < 50%.  Being addressed via diet advancement, supplements  GOAL:   Patient will meet greater than or equal to 90% of their needs  Progressing  MONITOR:   PO intake, Supplement acceptance, Weight trends, Labs, I & O's  REASON FOR ASSESSMENT:   Consult LVAD Eval  ASSESSMENT:   Patient with PMH significant for HTN. Presents this admission with STEMI and acute systolic heart failure.  6/24 HM3 LVAD placed 7/08 IABP/[place]wan placed, Intubated 7/13 IABP discontinued 7/13 Extubated  Diet advanced this AM; RN reporting pt ate well  Weight relatively stable recently  Labs:  reviewed Meds: lasix, ss novolog, folic acid, MVI with Minerals  Diet Order:   Diet Order            Diet Heart Room service appropriate? Yes; Fluid consistency: Thin  Diet effective now                 EDUCATION NEEDS:   Not appropriate for education at this time  Skin:  Skin Assessment: Reviewed RN Assessment  Last BM:  7/14  Height:   Ht Readings from Last 1 Encounters:  06/18/19 5\' 9"  (1.753 m)    Weight:   Wt Readings from Last 1 Encounters:  07/15/19 94.2 kg   BMI:  Body mass index is 30.67 kg/m.  Estimated Nutritional Needs:   Kcal:  2300-2500 kcal  Protein:  115-130 grams  Fluid:  >/= 2.3 L/day   Kerman Passey MS, RDN, LDN, CNSC Registered Dietitian III Clinical Nutrition RD Pager and On-Call Pager Number Located in [place]

## 2019-07-16 ENCOUNTER — Inpatient Hospital Stay (HOSPITAL_COMMUNITY): Payer: BC Managed Care – PPO

## 2019-07-16 DIAGNOSIS — I5022 Chronic systolic (congestive) heart failure: Secondary | ICD-10-CM

## 2019-07-16 LAB — COOXEMETRY PANEL
Carboxyhemoglobin: 1.3 % (ref 0.5–1.5)
Methemoglobin: 0.9 % (ref 0.0–1.5)
O2 Saturation: 61.5 %
Total hemoglobin: 9.9 g/dL — ABNORMAL LOW (ref 12.0–16.0)

## 2019-07-16 LAB — BRAIN NATRIURETIC PEPTIDE: B Natriuretic Peptide: 3431.7 pg/mL — ABNORMAL HIGH (ref 0.0–100.0)

## 2019-07-16 LAB — GLUCOSE, CAPILLARY
Glucose-Capillary: 106 mg/dL — ABNORMAL HIGH (ref 70–99)
Glucose-Capillary: 112 mg/dL — ABNORMAL HIGH (ref 70–99)
Glucose-Capillary: 179 mg/dL — ABNORMAL HIGH (ref 70–99)
Glucose-Capillary: 104 mg/dL — ABNORMAL HIGH (ref 70–99)

## 2019-07-16 LAB — BASIC METABOLIC PANEL
Anion gap: 11 (ref 5–15)
BUN: 18 mg/dL (ref 6–20)
CO2: 22 mmol/L (ref 22–32)
Calcium: 7.9 mg/dL — ABNORMAL LOW (ref 8.9–10.3)
Chloride: 104 mmol/L (ref 98–111)
Creatinine, Ser: 1.28 mg/dL — ABNORMAL HIGH (ref 0.61–1.24)
GFR calc Af Amer: >60 mL/min (ref >60)
GFR calc non Af Amer: >60 mL/min (ref >60)
Glucose, Bld: 159 mg/dL — ABNORMAL HIGH (ref 70–99)
Potassium: 3.4 mmol/L — ABNORMAL LOW (ref 3.5–5.1)
Sodium: 137 mmol/L (ref 135–145)

## 2019-07-16 LAB — PROTIME-INR
INR: 1.5 — ABNORMAL HIGH (ref 0.8–1.2)
Prothrombin Time: 17.2 seconds — ABNORMAL HIGH (ref 11.4–15.2)

## 2019-07-16 LAB — CBC
HCT: 31.1 % — ABNORMAL LOW (ref 39.0–52.0)
Hemoglobin: 9.5 g/dL — ABNORMAL LOW (ref 13.0–17.0)
MCH: 29.7 pg (ref 26.0–34.0)
MCHC: 30.5 g/dL (ref 30.0–36.0)
MCV: 97.2 fL (ref 80.0–100.0)
Platelets: 193 10*3/uL (ref 150–400)
RBC: 3.2 MIL/uL — ABNORMAL LOW (ref 4.22–5.81)
RDW: 16.1 % — ABNORMAL HIGH (ref 11.5–15.5)
WBC: 9.4 10*3/uL (ref 4.0–10.5)
nRBC: 0 % (ref 0.0–0.2)

## 2019-07-16 LAB — MAGNESIUM: Magnesium: 1.8 mg/dL (ref 1.7–2.4)

## 2019-07-16 LAB — HEPARIN LEVEL (UNFRACTIONATED): Heparin Unfractionated: <0.1 IU/mL — ABNORMAL LOW (ref 0.30–0.70)

## 2019-07-16 LAB — LACTATE DEHYDROGENASE: LDH: 317 U/L — ABNORMAL HIGH (ref 98–192)

## 2019-07-16 MED ORDER — AMIODARONE HCL 200 MG PO TABS
400.0000 mg | ORAL_TABLET | Freq: Two times a day (BID) | ORAL | Status: DC
  Administered 2019-07-16 – 2019-07-24 (×17): 400 mg via ORAL
  Filled 2019-07-16 (×17): qty 2

## 2019-07-16 MED ORDER — MAGNESIUM SULFATE 2 GM/50ML IV SOLN
2.0000 g | Freq: Once | INTRAVENOUS | Status: AC
  Administered 2019-07-16: 2 g via INTRAVENOUS
  Filled 2019-07-16: qty 50

## 2019-07-16 MED ORDER — POTASSIUM CHLORIDE 20 MEQ/15ML (10%) PO SOLN
40.0000 meq | Freq: Once | ORAL | Status: AC
  Administered 2019-07-16: 40 meq via ORAL
  Filled 2019-07-16: qty 30

## 2019-07-16 MED ORDER — WARFARIN SODIUM 1 MG PO TABS
1.0000 mg | ORAL_TABLET | Freq: Once | ORAL | Status: AC
  Administered 2019-07-16: 1 mg via ORAL
  Filled 2019-07-16: qty 1

## 2019-07-16 NOTE — Progress Notes (Signed)
LVAD Coordinator Rounding Note:  Transferred from [facility] ED on 06/17/19 with STEMI. Cath revealed multi-vessel CAD, wire would not cross to open LAD; IABP placed and pt transferred here for evaluation for CABG.   Dr. Orvan Seen evaluated and found he was not amenable to surgical or percutaneous revascularization due to poor targets. With severe depressed LV function and little coronary perfusion, LVAD was recommended.   HM III LVAD implanted on 06/25/19 by Dr. Orvan Seen under Destination Therapy criteria due to recent smoking history.  Developed sepsis/PNA with respiratory failure. Had refractory VT/VF. IABP/swan placed 07/09/19 - possible d/c today if repeat INR this afternoon is 2.5 or less.  Pt napping in bed this morning; denies complaints. States he walked in the hallway and was out of bed to the chair earlier this morning.   Vital signs: Temp: 98.1 HR: 63 SR w/ PVCs Auto BP: 100/53 (68) Doppler: 96 O2 Sat: 98% on 4 L/[place] Wt: 207>225.7>222.6>214.3>213.4>203.9>205.3>209>212......207>207.9>207.6>212.1 lbs  LVAD parameters: Speed:  5200 Flow: 4.2 Power:  3.6w PI: 4.3 Hct: 33  Alarms: none Events: >120 today  Fixed speed: 5200 Low speed limit: 4800   Drive Line: CDI.  Drive line anchor secure. Every other day dressing changes per VAD Coordinator, Nurse Davonna Belling, or trained caregiver. Existing VAD dressing clean, dry, intact. Dressing change due tomorrow.   Labs:  LDH trend: 660>398>347>459>462>410>554>383>490>1752>2605>548>447>375>338>320>317  INR trend: 1.4>1.3>1.1>1.2>1.4>1.5>1.7>2.7>3.6>5.9>4.1>4.7>4.7>4.2>3.8>1.6>1.4>1.5  WBC trend: 22.9>19.2>13.5>12.3>10.1>8.0>12.1>9.5  AST/ALT: 1854/1377>3004/2497>1441/2080>>686/1554>403/1162> 371/062>694/854   Anticoagulation Plan: -INR Goal:  2.0 - 2.5 -ASA Dose: 81 mg   Blood Products:  -Intra-op - 06/25/19>>6 units FFP  - 07/09/19>>2 FFP - 07/10/19>>2 FFP  Device: N/A  Drips: Amiodarone>>30mg /hr Heparin>>500  u/hr  Tube Feeding: stopped 07/14/19  Arrythmias: was on Amiodarone pre implant for NSVT - 2 episodes of VF requiring emergent DC-CV on 07/02/19 - Recurrent VF on 7/2 and broke spontaneously.  - 7/3 Recurrent VF at 2a.-> emergent DC-CV.  - 07/07/2019 VT/Torsades -> repeat DC_CV - 07/08/2019 VF/Torsades x2--> DC-CV x2.  - 07/09/19 Multiple episodes of VT/VF -> IABP placed. AV pacing via external pacing wires - underlying rhythm bradycardia 40 bpm; external AV pacing wires remain intact  Respiratory: extubated 06/26/19 - re-intubated 07/09/19 for sepsis/pneumoni - extubated 07/14/19   Nitric Oxide: off 06/26/19  - Restarted 07/10/19 - Off 07/14/19  VAD Education: 1. Sister-Joanne is checked off to perform driveline dressing changes independently. Not currently at bedside. 2. Asked patient to start reading HM III patient handbook. He verbalized understanding 3. PT/OT working with patient  Plan/Recommendations:  1. Call VAD pager if any VAD equipment or drive line issues. 2. Every other day dressing changes per VAD Coordinator, Nurse Davonna Belling, or pts sister.   Emerson Monte RN [place] Coordinator  Office: [phone]  24/7 Pager: [phone]

## 2019-07-16 NOTE — Progress Notes (Signed)
Inpatient Rehab Admissions:  Inpatient Rehab Consult received.  I met with pt and his sister at the bedside for rehabilitation assessment. Pt recently worked with PT prior to [facility] meeting and he notes he still feels weak and unstable during ambulation and would benefit from further rehab prior to DC home. AC discussed recommended rehab program, expectations, anticipated LOS, and expected functional gains. Both the patient and his sister are in favor of this program IF he is still in need of it once medically ready for rehab. Discussed that I will follow while he is in house to help determine the most appropriate post acute venue once deemed medically ready. Pt still has EPWS and on IV heparin. Will follow.   Please call if questions.   Raechel Ache, OTR/L  Rehab Admissions Coordinator  [phone] 07/16/2019 3:54 PM

## 2019-07-16 NOTE — Progress Notes (Signed)
Patient refused CPAP for the night. Patient wearing oxygen set at 4lpm with Sp02=97%

## 2019-07-16 NOTE — Progress Notes (Addendum)
Physical Therapy Treatment Patient Details Name: Aaron [name] MRN: [id number] DOB: 09-Jul-1965 Today's Date: 07/16/2019    History of Present Illness Pt is 54yo admitted 6/16 with STEMI with multivessel disease. cath with no viable targets for CABG. Pt s/p HeartMate 3 LVAD implantation 6/24. VT 2x on 7/1 s/p DCCV. 7/7 pt with torsades. 7/8 pt intubated, bronchoscopy with repeat Vtach s/p cardioversion and IABP placed. IABP removed 7/13 and extubated 7/13.  PMHx: HTN, tobacco use (30pack yrs)    PT Comments    Pt making steady progress but remains weak and with decr functional activity tolerance. Remains motivated to return to a higher level of function. Continue to feel he can benefit from CIR.    Follow Up Recommendations  CIR     Equipment Recommendations  Other (comment) (rollator)    Recommendations for Other Services       Precautions / Restrictions Precautions Precautions: Fall;Sternal;Other (comment) Precaution Comments: LVAD    Mobility  Bed Mobility Overal bed mobility: Needs Assistance Bed Mobility: Supine to Sit     Supine to sit: Min guard;+2 for safety/equipment     General bed mobility comments: Assist for safety and management of lines/tubes. Verbal cues for sternal precautions when scooting to EOB.  Transfers Overall transfer level: Needs assistance Equipment used: 4-wheeled walker Transfers: Sit to/from [facility] Sit to Stand: Min assist;+2 safety/equipment Stand pivot transfers: Min assist       General transfer comment: Assist to bring hips up and for balance. BSC to recliner with hand held assist   Ambulation/Gait Ambulation/Gait assistance: Min assist Gait Distance (Feet): 150 Feet Assistive device: 4-wheeled walker Gait Pattern/deviations: Step-through pattern;Decreased stride length Gait velocity: decr Gait velocity interpretation: 1.31 - 2.62 ft/sec, indicative of limited community ambulator General Gait Details:  Assist for balance and support. Fatigues quickly.    Stairs             Wheelchair Mobility    Modified Rankin (Stroke Patients Only)       Balance Overall balance assessment: Needs assistance Sitting-balance support: No upper extremity supported;Feet supported Sitting balance-Leahy Scale: Fair     Standing balance support: Bilateral upper extremity supported Standing balance-Leahy Scale: Poor Standing balance comment: rollator and min guard for static standing                            Cognition Arousal/Alertness: Awake/alert Behavior During Therapy: Flat affect Overall Cognitive Status: Within Functional Limits for tasks assessed                                        Exercises      General Comments        Pertinent Vitals/Pain Pain Assessment: No/denies pain    Home Living                      Prior Function            PT Goals (current goals can now be found in the care plan section) Acute Rehab PT Goals Patient Stated Goal: to get back home to his dog "Aaron [name]" Progress towards PT goals: Progressing toward goals    Frequency    Min 3X/week      PT Plan Current plan remains appropriate    Co-evaluation  AM-PAC PT "6 Clicks" Mobility   Outcome Measure  Help needed turning from your back to your side while in a flat bed without using bedrails?: A Little Help needed moving from lying on your back to sitting on the side of a flat bed without using bedrails?: A Little Help needed moving to and from a bed to a chair (including a wheelchair)?: A Little Help needed standing up from a chair using your arms (e.g., wheelchair or bedside chair)?: A Little Help needed to walk in hospital room?: A Little Help needed climbing 3-5 steps with a railing? : Total 6 Click Score: 16    End of Session Equipment Utilized During Treatment: Oxygen Activity Tolerance: Patient limited by fatigue Patient  left: in chair;with call bell/phone within reach;with family/visitor present Nurse Communication: Mobility status PT Visit Diagnosis: Other abnormalities of gait and mobility (R26.89);Muscle weakness (generalized) (M62.81)     Time: 8676-1950 PT Time Calculation (min) (ACUTE ONLY): 33 min  Charges:  $Gait Training: 23-37 mins                     [place] Pager [phone] Office [place] 07/16/2019, 4:25 PM

## 2019-07-16 NOTE — Progress Notes (Addendum)
Patient ID: Aaron [name], male   DOB: May 10, 1965, 54 y.o.   MRN: [id number]     Advanced Heart Failure Rounding Note  PCP-Cardiologist: No primary care provider on file.   Subjective:    - HM3 placement 6/24  - Had 2 episodes of VF requiring emergent DC-CV on 07/02/19 - Recurrent VF on 7/2 and broke spontaneously.  - 7/3 Recurrent VF at 2a.-> emergent DC-CV.  - 07/07/2019 VT/Torsades -> repeat DC_CV - 07/08/2019 VF/Torsades x2--> DC-CV x2.  - 07/09/19 developed sepsis/PNA -> intubated - 07/09/19 Multiple episodes of VT/VF -> IABP placed. AV pacing via EPW - 07/14/19 IABP removed and extubated. Pacing discontinued.  Progressing well. No further arrhthymias. No further VT or bradycardia.    Co-ox 62%.   On low dose midodrine w/ MAPs in the 80s.   CVP 14. Good UOP yesterday w/ an additional 4L out in UOP. Scr continues to improve, 1.28 today. K 3.4.  Remains on heparin bridge. INR 1.5.     LVAD Interrogation HM III: Speed: 5250 Flow: 4.1  PI: 3.5 Power: 3.7.  No PI events.   Objective:   Weight Range: 94.2 kg Body mass index is 30.67 kg/m.   Vital Signs:   Temp:  [97.8 F (36.6 C)-100.2 F (37.9 C)] 98.2 F (36.8 C) (07/15 0025) Pulse Rate:  [31-131] 60 (07/15 0000) Resp:  [19-38] 19 (07/15 0000) BP: (89-155)/(69-116) 98/78 (07/15 0000) SpO2:  [89 %-99 %] 97 % (07/15 0000) Arterial Line BP: (83-104)/(70-92) 85/70 (07/14 1115) Last BM Date: 07/15/19  Weight change: Filed Weights   07/13/19 0440 07/14/19 0500 07/15/19 0400  Weight: 93.9 kg 94.3 kg 94.2 kg    Intake/Output:   Intake/Output Summary (Last 24 hours) at 07/16/2019 0716 Last data filed at 07/16/2019 0200 Gross per 24 hour  Intake 2901.9 ml  Output 4000 ml  Net -1098.1 ml      Physical Exam   CVP 14 General: well appearing, OOB up in chair, no distress Neck: JVP ~12 cm. no thyromegaly or thyroid nodule. Lungs: clear bilaterally, no wheezing   CV: + LVAD Sounds  Abdomen:  soft, nontender, non  distended, no hepatosplenomegaly  Skin: Intact without lesions or rashes.  Neurologic: awake and alert. Moves all 4 extremities  Extremities: No clubbing or cyanosis.  HEENT: Normal.   Telemetry   NSR 60s. No further VT or bradycardia Personally reviewed  Labs    CBC Recent Labs    07/15/19 0440 07/16/19 0346  WBC 12.1* 9.4  HGB 10.1* 9.5*  HCT 33.1* 31.1*  MCV 99.4 97.2  PLT 214 540   Basic Metabolic Panel Recent Labs    07/13/19 1722 07/14/19 0425 07/15/19 0440 07/16/19 0346  NA  --    < > 143 137  K  --    < > 3.6 3.4*  CL  --    < > 111 104  CO2  --    < > 20* 22  GLUCOSE  --    < > 89 159*  BUN  --    < > 27* 18  CREATININE  --    < > 1.30* 1.28*  CALCIUM  --    < > 8.2* 7.9*  MG  --    < > 2.1 1.8  PHOS 4.0  --   --   --    < > = values in this interval not displayed.   Liver Function Tests Recent Labs    07/14/19 0425 07/15/19 0440  AST 269* 209*  ALT 894* 739*  ALKPHOS 131* 130*  BILITOT 1.1 0.9  PROT 6.0* 6.3*  ALBUMIN 2.1* 2.3*   No results for input(s): LIPASE, AMYLASE in the last 72 hours. Cardiac Enzymes No results for input(s): CKTOTAL, CKMB, CKMBINDEX, TROPONINI in the last 72 hours.  BNP: BNP (last 3 results) Recent Labs    07/02/19 0531 07/09/19 0313 07/16/19 0346  BNP 1,646.4* 1,815.2* 3,431.7*    ProBNP (last 3 results) No results for input(s): PROBNP in the last 8760 hours.   D-Dimer No results for input(s): DDIMER in the last 72 hours. Hemoglobin A1C No results for input(s): HGBA1C in the last 72 hours. Fasting Lipid Panel No results for input(s): CHOL, HDL, LDLCALC, TRIG, CHOLHDL, LDLDIRECT in the last 72 hours. Thyroid Function Tests No results for input(s): TSH, T4TOTAL, T3FREE, THYROIDAB in the last 72 hours.  Invalid input(s): FREET3  Other results:   Imaging    No results found.   Medications:     Scheduled Medications: . aspirin  81 mg Oral Daily  . atorvastatin  80 mg Oral Daily  . bisacodyl   10 mg Oral Daily   Or  . bisacodyl  10 mg Rectal Daily  . chlorhexidine  15 mL Mouth Rinse BID  . Chlorhexidine Gluconate Cloth  6 each Topical Daily  . clonazePAM  0.5 mg Oral BID  . colchicine  0.6 mg Oral BID  . digoxin  0.125 mg Oral Daily  . docusate sodium  100 mg Oral Daily  . feeding supplement (ENSURE ENLIVE)  237 mL Oral BID BM  . folic acid  1 mg Oral Daily  . furosemide  60 mg Intravenous BID  . gabapentin  300 mg Oral TID  . insulin aspart  0-5 Units Subcutaneous QHS  . insulin aspart  0-9 Units Subcutaneous TID WC  . mouth rinse  15 mL Mouth Rinse q12n4p  . melatonin  5 mg Oral QPC supper  . mexiletine  150 mg Oral Q12H  . midodrine  2.5 mg Oral TID WC  . multivitamin with minerals  1 tablet Oral Daily  . pantoprazole  40 mg Oral Daily  . polyethylene glycol  17 g Oral Daily  . potassium chloride  40 mEq Oral BID  . potassium chloride  40 mEq Oral Once  . ranolazine  500 mg Oral BID  . sodium chloride flush  10-40 mL Intracatheter Q12H  . sodium chloride flush  3 mL Intravenous Q12H  . sodium chloride flush  3 mL Intravenous Q12H  . thiamine  100 mg Oral Daily  . Warfarin - Pharmacist Dosing Inpatient   Does not apply q1600    Infusions: . sodium chloride    . sodium chloride Stopped (07/13/19 0432)  . sodium chloride Stopped (07/15/19 0129)  . amiodarone 30 mg/hr (07/16/19 0319)  . dexmedetomidine (PRECEDEX) IV infusion Stopped (07/14/19 1522)  . fentaNYL infusion INTRAVENOUS Stopped (07/14/19 1601)  . heparin 500 Units/hr (07/16/19 0200)  . lactated ringers    . vasopressin Stopped (07/10/19 1056)    PRN Medications: Place/Maintain arterial line **AND** sodium chloride, sodium chloride, acetaminophen, ALPRAZolam, bisacodyl, calcium carbonate, dextrose, fentaNYL (SUBLIMAZE) injection, ondansetron (ZOFRAN) IV, oxyCODONE, pneumococcal 23 valent vaccine, polyvinyl alcohol, sodium chloride flush, sodium chloride flush, traMADol, traZODone    Patient  Profile   Aaron [name] --post MI cardiogenic shock/ optimization prior to anticipated CABG, at the request of Dr. Marlou Porch, Cardiology.    Assessment/Plan   1. Refractory VT/VF  -  developed VF on 7/1 x 2 requiring emergent DC-CV - On 7/2 had self-terminating episode - On 7/3 persistent VF/torsades -> DC-CV - On 7/6  VF/torsades.  - On 7/7 VF /Torsades on 2 separate occasions-> DC-CV x2.  - On 7/8 multiple episodes on VT/VF (incessant) -> unclear which intervention broke it - off lidocaine. Remains on amiodarone 30/hr + mexiletine. Off inotropes. No further VT.   - discussed w/ EP.  Switch to PO amio, 400 mg bid today. Stop Mexiletine  - supplement K (3.4) and Mg (1.8) - keep K> 4.0 Mg >2.0 - VAD speed initially reduced to 5000 but output low. Now at 5200 (unable to go higher due to suction events). Has had low speeds dropping to 4850.  - Keep off inotropes if possible with VT.  - no further bradycardia. Can pull pacing wires today.   2. Acute Anterolateral STEMI/ Multivessel CAD - Emergent cath showed severe multivessel disease w/ 75% mid- distal LM, 100% prox LAD, not amendable to PCI (unable to cross w/ guide wire, probable CTO), 100% prox LCx (probable CTO) and 100% ostial RCA (probable CTO). There were mild to moderate collaterals right to left to the LAD, minimal right to right collaterals an minimal left to left collaterals. LVEF 25% - Hs trop peaked 27,000. - No targets for CABG / PCI.   - S/P HMIII VAD - suspect VF is ischemically-mediated  3.  Acute Systolic Heart Failure --->Cardiogenic Shock - 2/2 acute MI. LVEF by LVG 25% - IABP out 6/18 but replaced 6/23. Removed in OR on 6/24  - HMIII LVAD  Placed 6/24 - Extubated 6/25  - Was on milrinone for RV failure. Now off due to VF.  - IABP removed 7/13 - Co-ox 62%  - On midodrine 2.5 mg tid, MAP 80s.  - Continue digoxin, dig level ok    - CVP 14 today, good diuresis yesterday, -4.0L out. Continue to diurese w/ IV Lasix  60 mg bid  - LDH  improving,490-->1752 -> 2600 -> 548 -> 447->375->338->320->317  suspect rise due to septic shock. No evidence pump thrombosis  - Ramp echo 7/8 RV only mildly reduced. Good cannula position. Increase VAD speed as tolerated, currently 5200.  - VAD interrogated personally.  - INR 1.5. Continue coumadin + heparin bridge. Discussed dosing w/ pharmacy   4. Acute hypoxic respiratory failure - intubated 7/8. Extubated 7/13 - Chest CT with bilateral PNA R>L - CCM following - completed abx therapy w/ vanc/cefipime - PCT 1.97 => 1.3 => 0.6.  - BCx and BAL NGTD - Currently AF w/ normal WBC ct   5. Shock liver - LFTs trending down.  - follow  6. Tobacco Abuse - 30 pack years  - smoking cessation advised  - Will need to quit if to be considered for heart transplant in the future   7. Renal mass = RCC - MRI abdomen- 1.8 cm right renal lesion, most consistent with renal cell carcinoma. No evidence of renal vein involvement or nodal metastasis. - Discussed w/ Urology and R RCC is small and will just require surveillance.  Ok to proceed with VAD from their standpoint.   8. Hyponatremia  - Na stable, 137 today   9. Sleep Apnea - frequent apnea at night  - needs formal sleep study   - CPAP ordered while inpatient  10. Anemia - Post-op, stable 9.5 - INR 1.5   11. FEN - off TFs post extubation  - advancing diet   12. Debility -  continue to mobilize w/ PT - CIR recommended. Consult placed   Lyda Jester, PA-C  07/16/2019 7:16 AM  Patient seen with PA, agree with the above note.   CVP 10-11 today, co-ox 62%.  Good diuresis with I/Os net negative.   NSR in 60s, not pacing. No VT.   Walked 1/2 lap this morning.   General: Well appearing this am. NAD.  HEENT: Normal. Neck: Supple, JVP 10 cm. Carotids OK.  Cardiac:  Mechanical heart sounds with LVAD hum present.  Lungs:  CTAB, normal effort.  Abdomen:  NT, ND, no HSM. No bruits or masses. +BS  LVAD  exit site: Well-healed and incorporated. Dressing dry and intact. No erythema or drainage. Stabilization device present and accurately applied. Driveline dressing changed daily per sterile technique. Extremities:  Warm and dry. No cyanosis, clubbing, rash. 1+ ankle edema.  Neuro:  Alert & oriented x 3. Cranial nerves grossly intact. Moves all 4 extremities w/o difficulty. Affect pleasant    He is now off antibiotics, afebrile.   No further VT. Remains on amiodarone, ranolazine, mexiletine.  - Discussed with Dr. Caryl Comes, stop mexiletine today and amiodarone to po.  - HR in 60s NSR without pacing, ?remove pacing wires today.    INR1.5, on heparin gtt.   Still with volume overload.  Continue Lasix 60 mg IV bid today with K replacement. Good diuresis on this regimen yesterday.   MAP in 70s-80s, only on midodrine at this point.  Mobilize with PT.   CRITICAL CARE Performed by: Loralie Champagne  Total critical care time: 35 minutes  Critical care time was exclusive of separately billable procedures and treating other patients.  Critical care was necessary to treat or prevent imminent or life-threatening deterioration.  Critical care was time spent personally by me on the following activities: development of treatment plan with patient and/or surrogate as well as nursing, discussions with consultants, evaluation of patient's response to treatment, examination of patient, obtaining history from patient or surrogate, ordering and performing treatments and interventions, ordering and review of laboratory studies, ordering and review of radiographic studies, pulse oximetry and re-evaluation of patient's condition.  Loralie Champagne 07/16/2019 1:59 PM

## 2019-07-16 NOTE — Progress Notes (Signed)
Patient Name: Aaron [name]      SUBJECTIVE: looks terrfic  Walked yday, no chest pain or sob at rest, bothered by wires   Past Medical History:  Diagnosis Date  . Acute anterolateral wall MI ([place])   . CAD, multiple vessel   . Hypertension   . Ischemic cardiomyopathy   . Systolic heart failure ([place])   . Tobacco abuse     Scheduled Meds:  Scheduled Meds: . aspirin  81 mg Oral Daily  . atorvastatin  80 mg Oral Daily  . bisacodyl  10 mg Oral Daily   Or  . bisacodyl  10 mg Rectal Daily  . chlorhexidine  15 mL Mouth Rinse BID  . Chlorhexidine Gluconate Cloth  6 each Topical Daily  . clonazePAM  0.5 mg Oral BID  . colchicine  0.6 mg Oral BID  . digoxin  0.125 mg Oral Daily  . docusate sodium  100 mg Oral Daily  . feeding supplement (ENSURE ENLIVE)  237 mL Oral BID BM  . folic acid  1 mg Oral Daily  . furosemide  60 mg Intravenous BID  . gabapentin  300 mg Oral TID  . insulin aspart  0-5 Units Subcutaneous QHS  . insulin aspart  0-9 Units Subcutaneous TID WC  . mouth rinse  15 mL Mouth Rinse q12n4p  . melatonin  5 mg Oral QPC supper  . mexiletine  150 mg Oral Q12H  . midodrine  2.5 mg Oral TID WC  . multivitamin with minerals  1 tablet Oral Daily  . pantoprazole  40 mg Oral Daily  . polyethylene glycol  17 g Oral Daily  . potassium chloride  40 mEq Oral BID  . ranolazine  500 mg Oral BID  . sodium chloride flush  10-40 mL Intracatheter Q12H  . sodium chloride flush  3 mL Intravenous Q12H  . sodium chloride flush  3 mL Intravenous Q12H  . thiamine  100 mg Oral Daily  . Warfarin - Pharmacist Dosing Inpatient   Does not apply q1600   Continuous Infusions: . sodium chloride    . sodium chloride Stopped (07/15/19 0129)  . amiodarone 30 mg/hr (07/16/19 0800)  . heparin 500 Units/hr (07/16/19 0800)  . lactated ringers    . vasopressin Stopped (07/10/19 1056)   sodium chloride, acetaminophen, ALPRAZolam, bisacodyl, calcium carbonate, dextrose, fentaNYL  (SUBLIMAZE) injection, ondansetron (ZOFRAN) IV, oxyCODONE, pneumococcal 23 valent vaccine, polyvinyl alcohol, sodium chloride flush, sodium chloride flush, traMADol, traZODone    PHYSICAL EXAM Vitals:   07/16/19 0600 07/16/19 0729 07/16/19 0732 07/16/19 0800  BP:   (!) 89/77 (!) 93/49  Pulse:      Resp: (!) 25 (!) 25 (!) 21 20  Temp:    98.1 F (36.7 C)  TempSrc:    Oral  SpO2:    100%  Weight:      Height:        Well developed and well nourished in no acute distress HENT normal Neck supple   Clear Device pocket well healed; without hematoma or erythema.  There is no tethering  Regular rate and rhythm,LVAD hum Skin-warm and dry A & Oriented  Grossly normal sensory and motor function     TELEMETRY: Reviewed personnally pt in sinus without VT:    Intake/Output Summary (Last 24 hours) at 07/16/2019 0906 Last data filed at 07/16/2019 0800 Gross per 24 hour  Intake 2644.8 ml  Output 4725 ml  Net -2080.2 ml  LABS: Basic Metabolic Panel: Recent Labs  Lab  0000 07/12/19 0342 07/12/19 1616 07/13/19 0408 07/13/19 0602 07/13/19 0902 07/13/19 0902 07/13/19 1307 07/13/19 1307 07/13/19 1722 07/14/19 0425 07/14/19 0425 07/14/19 0507 07/14/19 1459 07/14/19 1459 07/15/19 0440 07/16/19 0346  NA  --   --  142  --    < > 147*  --  147*  --   --  145  --  151* 147*  --  143 137  K  --    < > 4.0 3.2*   < > 3.1*  --  4.3  --   --  3.2*  --  3.3* 3.7  --  3.6 3.4*  CL  --   --  112*  --   --  118*  --  117*  --   --  115*  --   --  117*  --  111 104  CO2  --   --  19*  --   --  20*  --  20*  --   --  19*  --   --  19*  --  20* 22  GLUCOSE  --   --  129*  --   --  136*  --  128*  --   --  148*  --   --  123*  --  89 159*  BUN  --   --  28*  --   --  31*  --  34*  --   --  32*  --   --  33*  --  27* 18  CREATININE  --   --  1.40*  --   --  1.43*  --  1.47*  --   --  1.34*  --   --  1.41*  --  1.30* 1.28*  CALCIUM  --    < > 8.1*  --   --  7.8*   < > 8.0*   < >  --  8.1*    < >  --  8.3*   < > 8.2* 7.9*  MG   < >   < >  --  2.3  --   --   --   --   --   --  2.3   < >  --   --   --  2.1 1.8  PHOS  --    < > 3.4 4.4  --   --   --   --   --  4.0  --   --   --   --   --   --   --    < > = values in this interval not displayed.   Cardiac Enzymes: No results for input(s): CKTOTAL, CKMB, CKMBINDEX, TROPONINI in the last 72 hours. CBC: Recent Labs  Lab 07/10/19 0434 07/10/19 1725 07/11/19 0327 07/11/19 0354 07/12/19 0342 07/12/19 0342 07/12/19 0359 07/13/19 0408 07/13/19 0602 07/14/19 0425 07/14/19 0507 07/15/19 0440 07/16/19 0346  WBC 19.2*  --  13.5*  --  12.3*  --   --  10.1  --  8.0  --  12.1* 9.4  HGB 9.3*   < > 9.6*   < > 10.0*   < > 9.5* 9.7* 7.8* 9.7* 9.5* 10.1* 9.5*  HCT 28.8*   < > 30.1*   < > 31.4*   < > 28.0* 31.4* 23.0* 31.6* 28.0* 33.1* 31.1*  MCV 96.6  --  96.5  --  97.8  --   --  98.7  --  98.8  --  99.4 97.2  PLT 309  --  292  --  282  --   --  249  --  236  --  214 193   < > = values in this interval not displayed.   PROTIME: Recent Labs    07/14/19 0514 07/15/19 0440 07/16/19 0346  LABPROT 18.4* 16.4* 17.2*  INR 1.6* 1.4* 1.5*   Liver Function Tests: Recent Labs    07/14/19 0425 07/15/19 0440  AST 269* 209*  ALT 894* 739*  ALKPHOS 131* 130*  BILITOT 1.1 0.9  PROT 6.0* 6.3*  ALBUMIN 2.1* 2.3*   No results for input(s): LIPASE, AMYLASE in the last 72 hours. BNP: BNP (last 3 results) Recent Labs    07/02/19 0531 07/09/19 0313 07/16/19 0346  BNP 1,646.4* 1,815.2* 3,431.7*    ProBNP (last 3 results) No results for input(s): PROBNP in the last 8760 hours.  D-Dimer: No results for input(s): DDIMER in the last 72 hours. Hemoglobin A1C: No results for input(s): HGBA1C in the last 72 hours. Fasting Lipid Panel: No results for input(s): CHOL, HDL, LDLCALC, TRIG, CHOLHDL, LDLDIRECT in the last 72 hours. Thyroid Function Tests: No results for input(s): TSH, T4TOTAL, T3FREE, THYROIDAB in the last 72 hours.  Invalid  input(s): FREET3 Anemia Panel: No results for input(s): VITAMINB12, FOLATE, FERRITIN, TIBC, IRON, RETICCTPCT in the last 72 hours.     ASSESSMENT AND PLAN:  Principal Problem:   Cardiogenic shock ([place]) Active Problems:   STEMI (ST elevation myocardial infarction) ([place])   DNR (do not resuscitate) discussion   Ischemic dilated cardiomyopathy ([place])   LVAD (left ventricular assist device) present ([place])   CAD in native artery   VF (ventricular fibrillation) ([place])   Weakness generalized  Stop mex Change amio to po And pull wires  Will see again as needed   Signed, Virl Axe MD  07/16/2019

## 2019-07-16 NOTE — Progress Notes (Signed)
[facility] for Coumadin + heparin Indication: LVAD  Allergies  Allergen Reactions  . Codeine Hives    Patient Measurements: Height: 5\' 9"  (175.3 cm) Weight: 94.2 kg (207 lb 10.8 oz) IBW/kg (Calculated) : 70.7 Heparin Dosing Weight: ~ 92 kg  Vital Signs: Temp: 98.2 F (36.8 C) (07/15 0025) Temp Source: Oral (07/15 0025) BP: 98/78 (07/15 0000) Pulse Rate: 60 (07/15 0000)  Labs: Recent Labs    07/13/19 1227 07/14/19 0425 07/14/19 0425 07/14/19 0507 07/14/19 0507 07/14/19 0514 07/14/19 1459 07/15/19 0440 07/16/19 0346  HGB  --  9.7*   < > 9.5*   < >  --   --  10.1* 9.5*  HCT  --  31.6*   < > 28.0*  --   --   --  33.1* 31.1*  PLT  --  236  --   --   --   --   --  214 193  LABPROT   < >  --   --   --   --  18.4*  --  16.4* 17.2*  INR   < >  --   --   --   --  1.6*  --  1.4* 1.5*  HEPARINUNFRC  --   --   --   --   --  <0.10*  --  <0.10* <0.10*  CREATININE  --  1.34*   < >  --   --   --  1.41* 1.30* 1.28*   < > = values in this interval not displayed.    Estimated Creatinine Clearance: 73.9 mL/min (A) (by C-G formula based on SCr of 1.28 mg/dL (H)).  Assessment: 54 yo male s/p LVAD HM3 placement 6/24.  Pharmacy asked to dose  anticoagulation with IV heparin and Coumadin. No overt bleeding or complications noted.    IABP was pulled and pt extubated on 7/13.   INR up to 1.5 today after warfarin restarted yesterday. Hgb stable at 9.5, plt 193. LDH 317. Heparin level remains undetectable on fixed low-dose heparin 500 units/hr.    Goal of Therapy:  Heparin level: 0.2-0.5 - currently planned for fixed dose heparin  INR 2-2.5  Monitor platelets by anticoagulation protocol: Yes   Plan:  -Warfarin 1mg  PO x1 today -Continue heparin infusion at 500 units/hr until INR therapeutic -Daily INR, CBC, LDH, s/sx of bleeding, HL  Richardine Service, PharmD PGY2 Cardiology Pharmacy Resident Phone: [phone] 07/16/2019  7:05 AM  Please check  [email or web address] for unit-specific pharmacy phone numbers.

## 2019-07-16 NOTE — Progress Notes (Signed)
7 Days Post-Op Procedure(s) (LRB): RIGHT HEART CATH (N/A) IABP Insertion (N/A) Subjective: walked in hall with PT INR 1.5, coumadin load in progress HR 70 off pacer DONOT REMOVE EPWS  While on iv heparin  Objective: Vital signs in last 24 hours: Temp:  [97.8 F (36.6 C)-98.3 F (36.8 C)] 97.8 F (36.6 C) (07/15 1200) Pulse Rate:  [26-95] 26 (07/15 1400) Cardiac Rhythm: Normal sinus rhythm (07/15 1200) Resp:  [15-29] 23 (07/15 1400) BP: (89-132)/(49-113) 95/80 (07/15 1400) SpO2:  [89 %-100 %] 97 % (07/15 1400) Weight:  [96.2 kg] 96.2 kg (07/15 0400)  Hemodynamic parameters for last 24 hours: CVP:  [11 mmHg-17 mmHg] 17 mmHg  Intake/Output from previous day: 07/14 0701 - 07/15 0700 In: 2901.9 [P.O.:2280; I.V.:421.8; IV Piggyback:200.1] Out: 5100 [Urine:5100] Intake/Output this shift: Total I/O In: 740.2 [P.O.:480; I.V.:210.3; IV Piggyback:50] Out: 1950 [ATFTD:3220]       Exam    General- alert and comfortable    Neck- no JVD, no cervical adenopathy palpable, no carotid bruit   Lungs- clear without rales, wheezes   Cor- regular rate and rhythm, normal VAD hum w/o murmur , gallop   Abdomen- soft, non-tender   Extremities - warm, non-tender, minimal edema   Neuro- oriented, appropriate, no focal weakness   Lab Results: Recent Labs    07/15/19 0440 07/16/19 0346  WBC 12.1* 9.4  HGB 10.1* 9.5*  HCT 33.1* 31.1*  PLT 214 193   BMET:  Recent Labs    07/15/19 0440 07/16/19 0346  NA 143 137  K 3.6 3.4*  CL 111 104  CO2 20* 22  GLUCOSE 89 159*  BUN 27* 18  CREATININE 1.30* 1.28*  CALCIUM 8.2* 7.9*    PT/INR:  Recent Labs    07/16/19 0346  LABPROT 17.2*  INR 1.5*   ABG    Component Value Date/Time   PHART 7.458 (H) 07/14/2019 0507   HCO3 21.2 07/14/2019 0507   TCO2 22 07/14/2019 0507   ACIDBASEDEF 2.0 07/14/2019 0507   O2SAT 61.5 07/16/2019 0346   CBG (last 3)  Recent Labs    07/15/19 2153 07/16/19 0656 07/16/19 1206  GLUCAP 164* 106* 112*     Assessment/Plan: S/P Procedure(s) (LRB): RIGHT HEART CATH (N/A) IABP Insertion (N/A) Continue current care, heparin bridge for coumadin load   LOS: 29 days    Aaron [name] 07/16/2019

## 2019-07-17 LAB — BASIC METABOLIC PANEL
Anion gap: 11 (ref 5–15)
BUN: 16 mg/dL (ref 6–20)
CO2: 25 mmol/L (ref 22–32)
Calcium: 8.4 mg/dL — ABNORMAL LOW (ref 8.9–10.3)
Chloride: 102 mmol/L (ref 98–111)
Creatinine, Ser: 1.19 mg/dL (ref 0.61–1.24)
GFR calc Af Amer: >60 mL/min (ref >60)
GFR calc non Af Amer: >60 mL/min (ref >60)
Glucose, Bld: 103 mg/dL — ABNORMAL HIGH (ref 70–99)
Potassium: 3.8 mmol/L (ref 3.5–5.1)
Sodium: 138 mmol/L (ref 135–145)

## 2019-07-17 LAB — CBC
HCT: 33.4 % — ABNORMAL LOW (ref 39.0–52.0)
Hemoglobin: 10.2 g/dL — ABNORMAL LOW (ref 13.0–17.0)
MCH: 30 pg (ref 26.0–34.0)
MCHC: 30.5 g/dL (ref 30.0–36.0)
MCV: 98.2 fL (ref 80.0–100.0)
Platelets: 199 10*3/uL (ref 150–400)
RBC: 3.4 MIL/uL — ABNORMAL LOW (ref 4.22–5.81)
RDW: 15.9 % — ABNORMAL HIGH (ref 11.5–15.5)
WBC: 8 10*3/uL (ref 4.0–10.5)
nRBC: 0 % (ref 0.0–0.2)

## 2019-07-17 LAB — MAGNESIUM: Magnesium: 2.1 mg/dL (ref 1.7–2.4)

## 2019-07-17 LAB — COOXEMETRY PANEL
Carboxyhemoglobin: 1.7 % — ABNORMAL HIGH (ref 0.5–1.5)
Methemoglobin: 0.7 % (ref 0.0–1.5)
O2 Saturation: 59.2 %
Total hemoglobin: 10.5 g/dL — ABNORMAL LOW (ref 12.0–16.0)

## 2019-07-17 LAB — GLUCOSE, CAPILLARY
Glucose-Capillary: 90 mg/dL (ref 70–99)
Glucose-Capillary: 95 mg/dL (ref 70–99)
Glucose-Capillary: 98 mg/dL (ref 70–99)
Glucose-Capillary: 101 mg/dL — ABNORMAL HIGH (ref 70–99)

## 2019-07-17 LAB — LACTATE DEHYDROGENASE: LDH: 257 U/L — ABNORMAL HIGH (ref 98–192)

## 2019-07-17 LAB — PROTIME-INR
INR: 1.3 — ABNORMAL HIGH (ref 0.8–1.2)
Prothrombin Time: 15.4 seconds — ABNORMAL HIGH (ref 11.4–15.2)

## 2019-07-17 LAB — HEPARIN LEVEL (UNFRACTIONATED): Heparin Unfractionated: <0.1 IU/mL — ABNORMAL LOW (ref 0.30–0.70)

## 2019-07-17 MED ORDER — WARFARIN SODIUM 2 MG PO TABS
2.0000 mg | ORAL_TABLET | Freq: Once | ORAL | Status: AC
  Administered 2019-07-17: 2 mg via ORAL
  Filled 2019-07-17: qty 1

## 2019-07-17 MED ORDER — TORSEMIDE 20 MG PO TABS
40.0000 mg | ORAL_TABLET | Freq: Every day | ORAL | Status: DC
  Administered 2019-07-17 – 2019-07-18 (×2): 40 mg via ORAL
  Filled 2019-07-17 (×2): qty 2

## 2019-07-17 NOTE — Progress Notes (Signed)
Inpatient Rehabilitation-Admissions Coordinator   Per HF team, pt may be ready for CIR by early next week. AC will continue to follow pt progress with therapies to see if he is still in need of CIR program once medically ready.   Will follow up Monday.   Raechel Ache, OTR/L  Rehab Admissions Coordinator  [phone] 07/17/2019 3:35 PM

## 2019-07-17 NOTE — Progress Notes (Addendum)
LVAD Coordinator Rounding Note:  Transferred from [facility] ED on 06/17/19 with STEMI. Cath revealed multi-vessel CAD, wire would not cross to open LAD; IABP placed and pt transferred here for evaluation for CABG.   Dr. Orvan Seen evaluated and found he was not amenable to surgical or percutaneous revascularization due to poor targets. With severe depressed LV function and little coronary perfusion, LVAD was recommended.   HM III LVAD implanted on 06/25/19 by Dr. Orvan Seen under Destination Therapy criteria due to recent smoking history.  Developed sepsis/PNA with respiratory failure. Had refractory VT/VF. IABP/swan placed 07/09/19 - discontinued 07/14/19  Pt sitting up in bed visiting with his son and ex-wife Otila Kluver) this morning. He states he feels "much better" than he has over the last few days. Walked the entire unit this morning without issue. States he has some sternal pain, but this is manageable. Plan to transfer to [facility] when bed available per Dr Aundra Dubin.   Discussed need for discharge teaching with Otila Kluver. She is available to come on Tuesday to begin teaching, and will coordinate a time with his sister Mechele Claude. Will finalize plan on Monday with VAD coordinator.   Vital signs: Temp: 98.5 HR: 66 SR w/ PVCs Auto BP: 99/59 (73) Doppler: 82 O2 Sat: 93% on RA Wt: 207>225.7>222.6>214.3>213.4>203.9>205.3>209>212......207>207.9>207.6>212.1>207.6 lbs  LVAD parameters: Speed:  5200 Flow: 3.8 Power:  3.6w PI: 5.0 Hct: 33  Alarms: none Events: rare  Fixed speed: 5200 Low speed limit: 4800   Drive Line: Dressing change observed by patient's ex-wife Otila Kluver. Existing VAD dressing with small amount yellow drainage noted. Drive line anchor secure. Existing VAD dressing removed and site care performed using sterile technique. Drive line exit site cleaned with Chlora prep applicators x 2, allowed to dry, and gauze dressing WITHOUT silver strip re-applied. Exit site unincorporated, erythema around exit  site persists (will trial no silver strip to see if this is an intolerance), small amount yellow drainage with no foul odor or rash noted. The velour is fully implanted at exit site. Anchor re-applied around drive line. Every day dressing changes per VAD Coordinator, Nurse Davonna Belling, or trained caregiver. Next dressing change due: 07/18/19.      Addendum 1600: Spoke with Dr Prescott Gum regarding drive line wound. Per Dr Prescott Gum will change dressing daily, wrapping drive line with calcium alginate strip. 2H RN Keane Scrape made aware of dressing order change.   Labs:  LDH trend: 660>398>347>459>462>410>554>383>490>1752>2605>548>447>375>338>320>317>257  INR trend: 1.4>1.3>1.1>1.2>1.4>1.5>1.7>2.7>3.6>5.9>4.1>4.7>4.7>4.2>3.8>1.6>1.4>1.5>1.3  WBC trend: 22.9>19.2>13.5>12.3>10.1>8.0>12.1>9.5>8.0  AST/ALT: 1854/1377>3004/2497>1441/2080>>686/1554>403/1162> 811/914>782/956   Anticoagulation Plan: -INR Goal:  2.0 - 2.5 -ASA Dose: 81 mg   Blood Products:  -Intra-op - 06/25/19>>6 units FFP  - 07/09/19>>2 FFP - 07/10/19>>2 FFP  Device: N/A  Drips: Amiodarone>>30mg /hr Heparin>>500 u/hr  Tube Feeding: stopped 07/14/19  Arrythmias: was on Amiodarone pre implant for NSVT - 2 episodes of VF requiring emergent DC-CV on 07/02/19 - Recurrent VF on 7/2 and broke spontaneously.  - 7/3 Recurrent VF at 2a.-> emergent DC-CV.  - 07/07/2019 VT/Torsades -> repeat DC_CV - 07/08/2019 VF/Torsades x2--> DC-CV x2.  - 07/09/19 Multiple episodes of VT/VF -> IABP placed. AV pacing via external pacing wires - underlying rhythm bradycardia 40 bpm; external AV pacing wires remain intact  Respiratory: extubated 06/26/19 - re-intubated 07/09/19 for sepsis/pneumoni - extubated 07/14/19   Nitric Oxide: off 06/26/19  - Restarted 07/10/19 - Off 07/14/19  VAD Education: 1. Sister-Joanne is checked off to perform driveline dressing changes independently. Not currently at bedside. 2. Encourage patient to start reading HM III patient handbook  over the weekend in preparation for VAD teaching next week 3. Pt's ex-wife Otila Kluver at bedside. VAD coordinator demonstrated/verbalized dressing change today. Plan to have [facility] perform dressing change next week. Provided with extra sterile gloves to take home to practice over the weekend. Provided print out of dressing change instructions for her to review.  4. Will plan for discharge teaching early next week.   Plan/Recommendations:  1. Call VAD pager if any VAD equipment or drive line issues. 2. Every other day dressing changes per VAD Coordinator, Nurse Davonna Belling, or pts sister.   Emerson Monte RN [place] Coordinator  Office: [phone]  24/7 Pager: [phone]

## 2019-07-17 NOTE — Progress Notes (Signed)
ANTICOAGULATION CONSULT NOTE  Pharmacy Consult for Coumadin + heparin Indication: LVAD  Allergies  Allergen Reactions   Codeine Hives    Patient Measurements: Height: 5\' 9"  (175.3 cm) Weight: 94.2 kg (207 lb 10.8 oz) IBW/kg (Calculated) : 70.7 Heparin Dosing Weight: ~ 92 kg  Vital Signs: Temp: 98.7 F (37.1 C) (07/16 0750) Temp Source: Oral (07/16 0750) BP: 98/60 (07/16 0830) Pulse Rate: 70 (07/16 0830)  Labs: Recent Labs    07/15/19 0440 07/15/19 0440 07/16/19 0346 07/17/19 0416  HGB 10.1*   < > 9.5* 10.2*  HCT 33.1*  --  31.1* 33.4*  PLT 214  --  193 199  LABPROT 16.4*  --  17.2* 15.4*  INR 1.4*  --  1.5* 1.3*  HEPARINUNFRC <0.10*  --  <0.10* <0.10*  CREATININE 1.30*  --  1.28* 1.19   < > = values in this interval not displayed.    Estimated Creatinine Clearance: 79.5 mL/min (by C-G formula based on SCr of 1.19 mg/dL).  Assessment: 54 yo male s/p LVAD HM3 placement 6/24.  Pharmacy asked to dose  anticoagulation with IV heparin and Coumadin. No overt bleeding or complications noted.    IABP was pulled and pt extubated on 7/13.   INR down to 1.3 today after receiving two doses of warfarin. Hgb stable at 10.5, plt 199. LDH 257. Heparin level remains undetectable on fixed low-dose heparin 500 units/hr.    Goal of Therapy:  Heparin level: 0.2-0.5 - currently planned for fixed dose heparin  INR 2-2.5  Monitor platelets by anticoagulation protocol: Yes   Plan:  -Warfarin 2mg  PO x1 today -Continue heparin infusion at 500 units/hr until INR therapeutic -Daily INR, CBC, LDH, s/sx of bleeding, HL  Richardine Service, PharmD PGY2 Cardiology Pharmacy Resident Phone: [phone] 07/17/2019  9:37 AM  Please check [email or web address] for unit-specific pharmacy phone numbers.

## 2019-07-17 NOTE — Progress Notes (Signed)
RT NOTE:  Pt refuses CPAP tonight. RA 96%

## 2019-07-17 NOTE — Progress Notes (Signed)
Transferred-in from 2H by wheelchair awake and alert 

## 2019-07-17 NOTE — Progress Notes (Addendum)
Patient ID: Aaron [name], male   DOB: [birth year], 54 y.o.   MRN: [id number]     Advanced Heart Failure Rounding Note  PCP-Cardiologist: No primary care provider on file.   Subjective:    - HM3 placement 6/24  - Had 2 episodes of VF requiring emergent DC-CV on 07/02/19 - Recurrent VF on 7/2 and broke spontaneously.  - 7/3 Recurrent VF at 2a.-> emergent DC-CV.  - 07/07/2019 VT/Torsades -> repeat DC_CV - 07/08/2019 VF/Torsades x2--> DC-CV x2.  - 07/09/19 developed sepsis/PNA -> intubated - 07/09/19 Multiple episodes of VT/VF -> IABP placed. AV pacing via EPW - 07/14/19 IABP removed and extubated. Pacing discontinued.   Co-ox 59%   Remains on heparin bridge. INR 1.3   Denies SOB. Walked around the unit this morning.   LVAD Interrogation HM III: Speed: 5250 Flow: 4.4 PI: 3.3 Power: 3.6.  No PI events.   Objective:   Weight Range: 94.2 kg Body mass index is 30.67 kg/m.   Vital Signs:   Temp:  [97.5 F (36.4 C)-98.1 F (36.7 C)] 98 F (36.7 C) (07/16 0400) Pulse Rate:  [26-145] 85 (07/16 0700) Resp:  [18-27] 22 (07/16 0700) BP: (79-127)/(49-94) 101/87 (07/16 0500) SpO2:  [76 %-100 %] 82 % (07/16 0700) Weight:  [94.2 kg] 94.2 kg (07/16 0500) Last BM Date: 07/16/19  Weight change: Filed Weights   07/15/19 0400 07/16/19 0400 07/17/19 0500  Weight: 94.2 kg 96.2 kg 94.2 kg    Intake/Output:   Intake/Output Summary (Last 24 hours) at 07/17/2019 0730 Last data filed at 07/17/2019 0100 Gross per 24 hour  Intake 1031.15 ml  Output 4075 ml  Net -3043.85 ml      Physical Exam  CVP 9-10  Physical Exam: GENERAL: No acute distress. HEENT: normal  NECK: Supple, JVP 9-10  .  2+ bilaterally, no bruits.  No lymphadenopathy or thyromegaly appreciated.   CARDIAC:  Mechanical heart sounds with LVAD hum present.  LUNGS:  Clear to auscultation bilaterally.  ABDOMEN:  Soft, round, nontender, positive bowel sounds x4.     LVAD exit site:   Dressing dry and intact.  No erythema or  drainage.  Stabilization device present and accurately applied.  Driveline dressing is being changed daily per sterile technique. EXTREMITIES:  Warm and dry, no cyanosis, clubbing, rash or edema . RUE PICC  NEUROLOGIC:  Alert and oriented x 3.    No aphasia.  No dysarthria.  Affect pleasant.      Telemetry  NSR 60s   Labs    CBC Recent Labs    07/16/19 0346 07/17/19 0416  WBC 9.4 8.0  HGB 9.5* 10.2*  HCT 31.1* 33.4*  MCV 97.2 98.2  PLT 193 983   Basic Metabolic Panel Recent Labs    07/16/19 0346 07/17/19 0416  NA 137 138  K 3.4* 3.8  CL 104 102  CO2 22 25  GLUCOSE 159* 103*  BUN 18 16  CREATININE 1.28* 1.19  CALCIUM 7.9* 8.4*  MG 1.8 2.1   Liver Function Tests Recent Labs    07/15/19 0440  AST 209*  ALT 739*  ALKPHOS 130*  BILITOT 0.9  PROT 6.3*  ALBUMIN 2.3*   No results for input(s): LIPASE, AMYLASE in the last 72 hours. Cardiac Enzymes No results for input(s): CKTOTAL, CKMB, CKMBINDEX, TROPONINI in the last 72 hours.  BNP: BNP (last 3 results) Recent Labs    07/02/19 0531 07/09/19 0313 07/16/19 0346  BNP 1,646.4* 1,815.2* 3,431.7*    ProBNP (last 3  results) No results for input(s): PROBNP in the last 8760 hours.   D-Dimer No results for input(s): DDIMER in the last 72 hours. Hemoglobin A1C No results for input(s): HGBA1C in the last 72 hours. Fasting Lipid Panel No results for input(s): CHOL, HDL, LDLCALC, TRIG, CHOLHDL, LDLDIRECT in the last 72 hours. Thyroid Function Tests No results for input(s): TSH, T4TOTAL, T3FREE, THYROIDAB in the last 72 hours.  Invalid input(s): FREET3  Other results:   Imaging    No results found.   Medications:     Scheduled Medications: . amiodarone  400 mg Oral BID  . aspirin  81 mg Oral Daily  . atorvastatin  80 mg Oral Daily  . bisacodyl  10 mg Oral Daily   Or  . bisacodyl  10 mg Rectal Daily  . chlorhexidine  15 mL Mouth Rinse BID  . Chlorhexidine Gluconate Cloth  6 each Topical Daily    . clonazePAM  0.5 mg Oral BID  . colchicine  0.6 mg Oral BID  . digoxin  0.125 mg Oral Daily  . docusate sodium  100 mg Oral Daily  . feeding supplement (ENSURE ENLIVE)  237 mL Oral BID BM  . folic acid  1 mg Oral Daily  . furosemide  60 mg Intravenous BID  . gabapentin  300 mg Oral TID  . insulin aspart  0-5 Units Subcutaneous QHS  . insulin aspart  0-9 Units Subcutaneous TID WC  . mouth rinse  15 mL Mouth Rinse q12n4p  . melatonin  5 mg Oral QPC supper  . midodrine  2.5 mg Oral TID WC  . multivitamin with minerals  1 tablet Oral Daily  . pantoprazole  40 mg Oral Daily  . polyethylene glycol  17 g Oral Daily  . potassium chloride  40 mEq Oral BID  . ranolazine  500 mg Oral BID  . sodium chloride flush  10-40 mL Intracatheter Q12H  . sodium chloride flush  3 mL Intravenous Q12H  . sodium chloride flush  3 mL Intravenous Q12H  . thiamine  100 mg Oral Daily  . Warfarin - Pharmacist Dosing Inpatient   Does not apply q1600    Infusions: . sodium chloride    . sodium chloride Stopped (07/15/19 0129)  . heparin 500 Units/hr (07/17/19 0556)  . lactated ringers    . vasopressin Stopped (07/10/19 1056)    PRN Medications: sodium chloride, acetaminophen, ALPRAZolam, bisacodyl, calcium carbonate, dextrose, fentaNYL (SUBLIMAZE) injection, ondansetron (ZOFRAN) IV, oxyCODONE, pneumococcal 23 valent vaccine, polyvinyl alcohol, sodium chloride flush, sodium chloride flush, traMADol, traZODone    Patient Profile   Aaron [name] --post MI cardiogenic shock/ optimization prior to anticipated CABG, at the request of Dr. Marlou Porch, Cardiology.    Assessment/Plan   1. Refractory VT/VF  - developed VF on 7/1 x 2 requiring emergent DC-CV - On 7/2 had self-terminating episode - On 7/3 persistent VF/torsades -> DC-CV - On 7/6  VF/torsades.  - On 7/7 VF /Torsades on 2 separate occasions-> DC-CV x2.  - On 7/8 multiple episodes on VT/VF (incessant) -> unclear which intervention broke it -  off lidocaine. Remains on amiodarone 30/hr + mexiletine. Off inotropes. No further VT.   - discussed w/ EP.  Switch to PO amio, 400 mg bid today. Stop Mexiletine  -  keep K> 4.0 Mg >2.0 - VAD speed initially reduced to 5000 but output low. Now at 5200 (unable to go higher due to suction events). Has had low speeds dropping to 4850.  -  Keep off inotropes if possible with VT.  - no further bradycardia. Can pull pacing wires today.   2. Acute Anterolateral STEMI/ Multivessel CAD - Emergent cath showed severe multivessel disease w/ 75% mid- distal LM, 100% prox LAD, not amendable to PCI (unable to cross w/ guide wire, probable CTO), 100% prox LCx (probable CTO) and 100% ostial RCA (probable CTO). There were mild to moderate collaterals right to left to the LAD, minimal right to right collaterals an minimal left to left collaterals. LVEF 25% - Hs trop peaked 27,000. - No targets for CABG / PCI.   - S/P HMIII VAD - suspect VF is ischemically-mediated  3.  Acute Systolic Heart Failure --->Cardiogenic Shock - 2/2 acute MI. LVEF by LVG 25% - IABP out 6/18 but replaced 6/23. Removed in OR on 6/24  - HMIII LVAD  Placed 6/24 - Extubated 6/25  - Was on milrinone for RV failure. Now off due to VF.  - IABP removed 7/13 - CO-OX stable 59%  - CVP 9-10. Volume status improved. Stop IV lasix. Start torsemide 40 mg twice a day.  - On midodrine 2.5 mg tid, MAP 80s.  - Continue digoxin, dig level ok    - LDH  Peaked  2600 but now down to 257. No evidence pump thrombosis  - Ramp echo 7/8 RV only mildly reduced. Good cannula position. Increase VAD speed as tolerated, currently 5200.  - VAD interrogated personally.  - INR 1.3. Continue coumadin + heparin bridge. Discussed dosing w/ pharmacy   4. Acute hypoxic respiratory failure - intubated 7/8. Extubated 7/13 - Chest CT with bilateral PNA R>L - CCM following - completed abx therapy w/ vanc/cefipime - PCT 1.97 => 1.3 => 0.6.  - BCx and BAL NGTD -  Currently AF w/ normal WBC ct   5. Shock liver - Check LFTS.  6. Tobacco Abuse - 30 pack years  - smoking cessation advised  - Will need to quit if to be considered for heart transplant in the future   7. Renal mass = RCC - MRI abdomen- 1.8 cm right renal lesion, most consistent with renal cell carcinoma. No evidence of renal vein involvement or nodal metastasis. - Discussed w/ Urology and R RCC is small and will just require surveillance.  Ok to proceed with VAD from their standpoint.   8. Hyponatremia  - Na stable, 138   9. Sleep Apnea - frequent apnea at night  - needs formal sleep study   - CPAP ordered while inpatient  10. Anemia - Post-op, stable 10.2  - INR 1.3  11. FEN - off TFs post extubation  - advancing diet   12. Debility - continue to mobilize w/ PT - CIR recommended. Consult placed   Transfer to [place].   Darrick Grinder, NP  07/17/2019 7:30 AM  Patient seen with PA, agree with the above note.  CVP 10-11 today, co-ox 62%. Good diuresis with I/Os net negative. CVP about 10.   NSR in 60s, not pacing. No VT.  Walked lap this morning.   General: Well appearing this am. NAD.  HEENT: Normal. Neck: Supple, JVP 9-10 cm. Carotids OK.  Cardiac:  Mechanical heart sounds with LVAD hum present.  Lungs:  CTAB, normal effort.  Abdomen:  NT, ND, no HSM. No bruits or masses. +BS  LVAD exit site: Well-healed and incorporated. Dressing dry and intact. No erythema or drainage. Stabilization device present and accurately applied. Driveline dressing changed daily per sterile technique. Extremities:  Warm and  dry. No cyanosis, clubbing, rash, or edema.  Neuro:  Alert & oriented x 3. Cranial nerves grossly intact. Moves all 4 extremities w/o difficulty. Affect pleasant    No further VT. Remains on amiodarone, ranolazine.  Off mexiletine.  He has not required pacing, can remove EPWs when safe per surgeon.   INR1.3, on heparin gtt.  Volume status improving,  transition to torsemide 40 mg daily.   MAP 80s, think we can stop midodrine.   Should get another ramp echo prior to discharge to optimize speed.   Mobilize with PT, to [place].   CRITICAL CARE Performed by: Loralie Champagne  Total critical care time: 35 minutes  Critical care time was exclusive of separately billable procedures and treating other patients.  Critical care was necessary to treat or prevent imminent or life-threatening deterioration.  Critical care was time spent personally by me on the following activities: development of treatment plan with patient and/or surrogate as well as nursing, discussions with consultants, evaluation of patient's response to treatment, examination of patient, obtaining history from patient or surrogate, ordering and performing treatments and interventions, ordering and review of laboratory studies, ordering and review of radiographic studies, pulse oximetry and re-evaluation of patient's condition.   Loralie Champagne 07/17/2019 8:22 AM

## 2019-07-18 LAB — CBC
HCT: 33.9 % — ABNORMAL LOW (ref 39.0–52.0)
Hemoglobin: 10.4 g/dL — ABNORMAL LOW (ref 13.0–17.0)
MCH: 29.1 pg (ref 26.0–34.0)
MCHC: 30.7 g/dL (ref 30.0–36.0)
MCV: 94.7 fL (ref 80.0–100.0)
Platelets: 203 10*3/uL (ref 150–400)
RBC: 3.58 MIL/uL — ABNORMAL LOW (ref 4.22–5.81)
RDW: 15.6 % — ABNORMAL HIGH (ref 11.5–15.5)
WBC: 7 10*3/uL (ref 4.0–10.5)
nRBC: 0 % (ref 0.0–0.2)

## 2019-07-18 LAB — LACTATE DEHYDROGENASE: LDH: 247 U/L — ABNORMAL HIGH (ref 98–192)

## 2019-07-18 LAB — HEPARIN LEVEL (UNFRACTIONATED): Heparin Unfractionated: <0.1 IU/mL — ABNORMAL LOW (ref 0.30–0.70)

## 2019-07-18 LAB — GLUCOSE, CAPILLARY
Glucose-Capillary: 80 mg/dL (ref 70–99)
Glucose-Capillary: 118 mg/dL — ABNORMAL HIGH (ref 70–99)
Glucose-Capillary: 116 mg/dL — ABNORMAL HIGH (ref 70–99)
Glucose-Capillary: 124 mg/dL — ABNORMAL HIGH (ref 70–99)

## 2019-07-18 LAB — COOXEMETRY PANEL
Carboxyhemoglobin: 2.4 % — ABNORMAL HIGH (ref 0.5–1.5)
Methemoglobin: 1.1 % (ref 0.0–1.5)
O2 Saturation: 53.5 %
Total hemoglobin: 11.2 g/dL — ABNORMAL LOW (ref 12.0–16.0)

## 2019-07-18 LAB — BASIC METABOLIC PANEL
Anion gap: 10 (ref 5–15)
BUN: 16 mg/dL (ref 6–20)
CO2: 27 mmol/L (ref 22–32)
Calcium: 8.6 mg/dL — ABNORMAL LOW (ref 8.9–10.3)
Chloride: 99 mmol/L (ref 98–111)
Creatinine, Ser: 1.18 mg/dL (ref 0.61–1.24)
GFR calc Af Amer: >60 mL/min (ref >60)
GFR calc non Af Amer: >60 mL/min (ref >60)
Glucose, Bld: 98 mg/dL (ref 70–99)
Potassium: 3.9 mmol/L (ref 3.5–5.1)
Sodium: 136 mmol/L (ref 135–145)

## 2019-07-18 LAB — PROTIME-INR
INR: 1.2 (ref 0.8–1.2)
Prothrombin Time: 14.9 seconds (ref 11.4–15.2)

## 2019-07-18 LAB — MAGNESIUM: Magnesium: 1.8 mg/dL (ref 1.7–2.4)

## 2019-07-18 MED ORDER — WARFARIN SODIUM 4 MG PO TABS
4.0000 mg | ORAL_TABLET | Freq: Once | ORAL | Status: AC
  Administered 2019-07-18: 4 mg via ORAL
  Filled 2019-07-18: qty 1

## 2019-07-18 MED ORDER — MAGNESIUM SULFATE 2 GM/50ML IV SOLN
2.0000 g | Freq: Once | INTRAVENOUS | Status: AC
  Administered 2019-07-18: 2 g via INTRAVENOUS
  Filled 2019-07-18: qty 50

## 2019-07-18 NOTE — Plan of Care (Signed)
  Problem: Education: Goal: Understanding of cardiac disease, CV risk reduction, and recovery process will improve Outcome: Progressing Goal: Understanding of medication regimen will improve Outcome: Progressing   Problem: Activity: Goal: Ability to tolerate increased activity will improve Outcome: Progressing   Problem: Cardiac: Goal: Ability to achieve and maintain adequate cardiopulmonary perfusion will improve Outcome: Progressing   Problem: Health Behavior/Discharge Planning: Goal: Ability to safely manage health-related needs after discharge will improve Outcome: Progressing   Problem: Clinical Measurements: Goal: Ability to maintain clinical measurements within normal limits will improve Outcome: Progressing Goal: Will remain free from infection Outcome: Progressing

## 2019-07-18 NOTE — Progress Notes (Signed)
Per Hospital Supply Dept: Calcium Alginate is no longer available, Melgisorb Plus" is the new alternative product.

## 2019-07-18 NOTE — Procedures (Signed)
Patient refuses CPAP tonight.  States he does not use CPAP at home.

## 2019-07-18 NOTE — Progress Notes (Signed)
Patient ID: Aaron [name], male   DOB: [birth year], 54 y.o.   MRN: [id number]     Advanced Heart Failure Rounding Note  PCP-Cardiologist: No primary care provider on file.   Subjective:    - HM3 placement 6/24  - Had 2 episodes of VF requiring emergent DC-CV on 07/02/19 - Recurrent VF on 7/2 and broke spontaneously.  - 7/3 Recurrent VF at 2a.-> emergent DC-CV.  - 07/07/2019 VT/Torsades -> repeat DC_CV - 07/08/2019 VF/Torsades x2--> DC-CV x2.  - 07/09/19 developed sepsis/PNA -> intubated - 07/09/19 Multiple episodes of VT/VF -> IABP placed. AV pacing via EPW - 07/14/19 IABP removed and extubated. Pacing discontinued.   Co-ox 54%, CVP 10.     Remains on heparin bridge. INR 1.2.    No dyspnea, ready to walk.   LVAD Interrogation HM III: Speed: 5200 Flow: 4.2 PI: 3.3 Power: 3.7.  6 PI events in 24 hrs.   Telemetry: NSR, no ventricular arrhythmias.   Objective:   Weight Range: 91 kg Body mass index is 29.63 kg/m.   Vital Signs:   Temp:  [98.2 F (36.8 C)-98.5 F (36.9 C)] 98.2 F (36.8 C) (07/17 0820) Pulse Rate:  [50-137] 76 (07/17 0820) Resp:  [16-26] 19 (07/17 0820) BP: (74-115)/(58-100) 88/73 (07/17 0820) SpO2:  [88 %-99 %] 92 % (07/17 0820) Weight:  [91 kg] 91 kg (07/17 0239) Last BM Date: 07/18/19  Weight change: Filed Weights   07/16/19 0400 07/17/19 0500 07/18/19 0239  Weight: 96.2 kg 94.2 kg 91 kg    Intake/Output:   Intake/Output Summary (Last 24 hours) at 07/18/2019 0933 Last data filed at 07/18/2019 0622 Gross per 24 hour  Intake 210.15 ml  Output 1431 ml  Net -1220.85 ml      Physical Exam  CVP 10 Physical Exam: General: Well appearing this am. NAD.  HEENT: Normal. Neck: Supple, JVP 8-9 cm. Carotids OK.  Cardiac:  Mechanical heart sounds with LVAD hum present.  Lungs:  CTAB, normal effort.  Abdomen:  NT, ND, no HSM. No bruits or masses. +BS  LVAD exit site: Well-healed and incorporated. Dressing dry and intact. No erythema or drainage. Stabilization  device present and accurately applied. Driveline dressing changed daily per sterile technique. Extremities:  Warm and dry. No cyanosis, clubbing, rash, or edema.  Neuro:  Alert & oriented x 3. Cranial nerves grossly intact. Moves all 4 extremities w/o difficulty. Affect pleasant     Telemetry  NSR 60s   Labs    CBC Recent Labs    07/17/19 0416 07/18/19 0359  WBC 8.0 7.0  HGB 10.2* 10.4*  HCT 33.4* 33.9*  MCV 98.2 94.7  PLT 199 762   Basic Metabolic Panel Recent Labs    07/17/19 0416 07/18/19 0359  NA 138 136  K 3.8 3.9  CL 102 99  CO2 25 27  GLUCOSE 103* 98  BUN 16 16  CREATININE 1.19 1.18  CALCIUM 8.4* 8.6*  MG 2.1 1.8   Liver Function Tests No results for input(s): AST, ALT, ALKPHOS, BILITOT, PROT, ALBUMIN in the last 72 hours. No results for input(s): LIPASE, AMYLASE in the last 72 hours. Cardiac Enzymes No results for input(s): CKTOTAL, CKMB, CKMBINDEX, TROPONINI in the last 72 hours.  BNP: BNP (last 3 results) Recent Labs    07/02/19 0531 07/09/19 0313 07/16/19 0346  BNP 1,646.4* 1,815.2* 3,431.7*    ProBNP (last 3 results) No results for input(s): PROBNP in the last 8760 hours.   D-Dimer No results for input(s): DDIMER  in the last 72 hours. Hemoglobin A1C No results for input(s): HGBA1C in the last 72 hours. Fasting Lipid Panel No results for input(s): CHOL, HDL, LDLCALC, TRIG, CHOLHDL, LDLDIRECT in the last 72 hours. Thyroid Function Tests No results for input(s): TSH, T4TOTAL, T3FREE, THYROIDAB in the last 72 hours.  Invalid input(s): FREET3  Other results:   Imaging    No results found.   Medications:     Scheduled Medications: . amiodarone  400 mg Oral BID  . aspirin  81 mg Oral Daily  . atorvastatin  80 mg Oral Daily  . bisacodyl  10 mg Oral Daily   Or  . bisacodyl  10 mg Rectal Daily  . chlorhexidine  15 mL Mouth Rinse BID  . Chlorhexidine Gluconate Cloth  6 each Topical Daily  . clonazePAM  0.5 mg Oral BID  .  colchicine  0.6 mg Oral BID  . digoxin  0.125 mg Oral Daily  . docusate sodium  100 mg Oral Daily  . feeding supplement (ENSURE ENLIVE)  237 mL Oral BID BM  . folic acid  1 mg Oral Daily  . gabapentin  300 mg Oral TID  . insulin aspart  0-5 Units Subcutaneous QHS  . insulin aspart  0-9 Units Subcutaneous TID WC  . mouth rinse  15 mL Mouth Rinse q12n4p  . melatonin  5 mg Oral QPC supper  . multivitamin with minerals  1 tablet Oral Daily  . pantoprazole  40 mg Oral Daily  . polyethylene glycol  17 g Oral Daily  . potassium chloride  40 mEq Oral BID  . ranolazine  500 mg Oral BID  . sodium chloride flush  10-40 mL Intracatheter Q12H  . sodium chloride flush  3 mL Intravenous Q12H  . sodium chloride flush  3 mL Intravenous Q12H  . thiamine  100 mg Oral Daily  . torsemide  40 mg Oral Daily  . Warfarin - Pharmacist Dosing Inpatient   Does not apply q1600    Infusions: . sodium chloride    . sodium chloride Stopped (07/15/19 0129)  . heparin 500 Units/hr (07/17/19 2000)  . lactated ringers      PRN Medications: sodium chloride, acetaminophen, ALPRAZolam, bisacodyl, calcium carbonate, dextrose, fentaNYL (SUBLIMAZE) injection, ondansetron (ZOFRAN) IV, oxyCODONE, pneumococcal 23 valent vaccine, polyvinyl alcohol, sodium chloride flush, sodium chloride flush, traMADol, traZODone    Patient Profile   Aaron [name] --post MI cardiogenic shock/ optimization prior to anticipated CABG, at the request of Dr. Marlou Porch, Cardiology.    Assessment/Plan   1. Refractory VT/VF  - developed VF on 7/1 x 2 requiring emergent DC-CV - On 7/2 had self-terminating episode - On 7/3 persistent VF/torsades -> DC-CV - On 7/6  VF/torsades.  - On 7/7 VF /Torsades on 2 separate occasions-> DC-CV x2.  - On 7/8 multiple episodes on VT/VF (incessant) -> unclear which intervention broke it   - Now off mexiletine and on amiodarone 400 mg bid.  -  keep K> 4.0 Mg >2.0 - VAD speed initially reduced to 5000 but  output low. Now at 5200.  - Keep off inotropes with VT.  - No ventricular arrhythmias on telemetry.   2. Acute Anterolateral STEMI/ Multivessel CAD - Emergent cath showed severe multivessel disease w/ 75% mid- distal LM, 100% prox LAD, not amendable to PCI (unable to cross w/ guide wire, probable CTO), 100% prox LCx (probable CTO) and 100% ostial RCA (probable CTO). There were mild to moderate collaterals right to left to the  LAD, minimal right to right collaterals an minimal left to left collaterals. LVEF 25% - Hs trop peaked 27,000. - No targets for CABG / PCI.   - S/P HMIII VAD - suspect VF was ischemically-mediated  3.  Acute Systolic Heart Failure --->Cardiogenic Shock - 2/2 acute MI. LVEF by LVG 25% - IABP out 6/18 but replaced 6/23. Removed in OR on 6/24  - HMIII LVAD  Placed 6/24 - Extubated 6/25  - Was on milrinone for RV failure. Now off due to VF.  - IABP removed 7/13 - CO-OX mildly low at 54%.  - CVP 10. Volume status improved overall. Continue torsemide 40 mg daily for gradual diuresis.  - Midodrine stopped, MAP 80.  - Continue digoxin for RV support.   - LDH  Peaked  2600 but now down to 247. No evidence pump thrombosis  - Ramp echo 7/8 RV only mildly reduced. Good cannula position. Increase VAD speed as tolerated, currently 5200.  Would repeat ramp echo prior to discharge now that ventricular arrhythmias have calmed, may be able to increase speed further.  - INR 1.2. Continue coumadin + heparin bridge. Discussed dosing w/ pharmacy   4. Acute hypoxic respiratory failure - intubated 7/8. Extubated 7/13 - Chest CT with bilateral PNA R>L - CCM following - completed abx therapy w/ vanc/cefipime - PCT 1.97 => 1.3 => 0.6.  - BCx and BAL NGTD - Currently AF w/ normal WBC ct   5. Shock liver - Resolved.  6. Tobacco Abuse - 30 pack years  - smoking cessation advised  - Will need to quit if to be considered for heart transplant in the future   7. Renal mass = RCC - MRI  abdomen- 1.8 cm right renal lesion, most consistent with renal cell carcinoma. No evidence of renal vein involvement or nodal metastasis. - Discussed w/ Urology and R RCC is small and will just require surveillance.  Ok to proceed with VAD from their standpoint.   8. Hyponatremia  - Resolved.   9. Sleep Apnea - frequent apnea at night  - needs formal sleep study   - CPAP ordered while inpatient  10. Anemia - Post-op, stable 10.4   11. FEN - off TFs post extubation  - advancing diet   12. Debility - continue to mobilize w/ PT - CIR recommended. Consult placed    Loralie Champagne, MD  07/18/2019 9:33 AM

## 2019-07-18 NOTE — Progress Notes (Signed)
CARDIAC REHAB PHASE I   PRE:  Rate/Rhythm: 68 SR  BP:  Supine:   Sitting: 91/80 (85)  Standing:    SaO2: 96%RA  MODE:  Ambulation: 275 ft   POST:  Rate/Rhythm: 74 SR  BP:  Supine:   Sitting: 115/81 (93)  dopplered at 102  Standing:    SaO2: 92%RA 1155-1237 Pt used BSC and then walked 275 ft on RA with rollator and asst x 2. Took one standing rest break. Ready to go back to bed after walk. Pt was able to change to batteries and back to power source with little assistance. Needs reminding to adhere to sternal precautions. Anchor secure on drive line prior to walk.  Tired by end of walk.   Graylon Good, RN BSN  07/18/2019 12:32 PM

## 2019-07-18 NOTE — Progress Notes (Signed)
[facility] for Coumadin + heparin Indication: LVAD  Allergies  Allergen Reactions  . Codeine Hives    Patient Measurements: Height: 5\' 9"  (175.3 cm) Weight: 91 kg (200 lb 9.9 oz) IBW/kg (Calculated) : 70.7 Heparin Dosing Weight: ~ 92 kg  Vital Signs: Temp: 97.9 F (36.6 C) (07/17 1225) Temp Source: Oral (07/17 1225) BP: 115/81 (07/17 1225) Pulse Rate: 35 (07/17 1225)  Labs: Recent Labs    07/16/19 0346 07/16/19 0346 07/17/19 0416 07/18/19 0359  HGB 9.5*   < > 10.2* 10.4*  HCT 31.1*  --  33.4* 33.9*  PLT 193  --  199 203  LABPROT 17.2*  --  15.4* 14.9  INR 1.5*  --  1.3* 1.2  HEPARINUNFRC <0.10*  --  <0.10* <0.10*  CREATININE 1.28*  --  1.19 1.18   < > = values in this interval not displayed.    Estimated Creatinine Clearance: 78.8 mL/min (by C-G formula based on SCr of 1.18 mg/dL).  Assessment: 54 yo male s/p LVAD HM3 placement 6/24.  Pharmacy asked to dose  anticoagulation with IV heparin and Coumadin. No overt bleeding or complications noted.    IABP was pulled and pt extubated on 7/13.   INR down to 1.2 today after three doses of warfarin. Will give boost dose today. Hgb stable at 10.4, plt 203. LDH 247. Heparin level remains undetectable on fixed low-dose heparin 500 units/hr.    Goal of Therapy:  Heparin level: 0.2-0.5 - currently planned for fixed dose heparin  INR 2-2.5  Monitor platelets by anticoagulation protocol: Yes   Plan:  -Warfarin 4mg  PO x1 today -Continue heparin infusion at 500 units/hr until INR therapeutic -Daily INR, CBC, LDH, s/sx of bleeding, HL  Richardine Service, PharmD PGY2 Cardiology Pharmacy Resident Phone: [phone] 07/18/2019  3:07 PM  Please check [email or web address] for unit-specific pharmacy phone numbers.

## 2019-07-19 LAB — BASIC METABOLIC PANEL
Anion gap: 11 (ref 5–15)
BUN: 15 mg/dL (ref 6–20)
CO2: 29 mmol/L (ref 22–32)
Calcium: 9 mg/dL (ref 8.9–10.3)
Chloride: 96 mmol/L — ABNORMAL LOW (ref 98–111)
Creatinine, Ser: 1.22 mg/dL (ref 0.61–1.24)
GFR calc Af Amer: >60 mL/min (ref >60)
GFR calc non Af Amer: >60 mL/min (ref >60)
Glucose, Bld: 107 mg/dL — ABNORMAL HIGH (ref 70–99)
Potassium: 4.2 mmol/L (ref 3.5–5.1)
Sodium: 136 mmol/L (ref 135–145)

## 2019-07-19 LAB — CBC
HCT: 35.4 % — ABNORMAL LOW (ref 39.0–52.0)
Hemoglobin: 11.2 g/dL — ABNORMAL LOW (ref 13.0–17.0)
MCH: 29.9 pg (ref 26.0–34.0)
MCHC: 31.6 g/dL (ref 30.0–36.0)
MCV: 94.4 fL (ref 80.0–100.0)
Platelets: 232 10*3/uL (ref 150–400)
RBC: 3.75 MIL/uL — ABNORMAL LOW (ref 4.22–5.81)
RDW: 15.8 % — ABNORMAL HIGH (ref 11.5–15.5)
WBC: 6.9 10*3/uL (ref 4.0–10.5)
nRBC: 0 % (ref 0.0–0.2)

## 2019-07-19 LAB — PROTIME-INR
INR: 1.2 (ref 0.8–1.2)
Prothrombin Time: 14.7 seconds (ref 11.4–15.2)

## 2019-07-19 LAB — COOXEMETRY PANEL
Carboxyhemoglobin: 2.4 % — ABNORMAL HIGH (ref 0.5–1.5)
Methemoglobin: 1.1 % (ref 0.0–1.5)
O2 Saturation: 55.6 %
Total hemoglobin: 11.5 g/dL — ABNORMAL LOW (ref 12.0–16.0)

## 2019-07-19 LAB — HEPARIN LEVEL (UNFRACTIONATED): Heparin Unfractionated: <0.1 IU/mL — ABNORMAL LOW (ref 0.30–0.70)

## 2019-07-19 LAB — LACTATE DEHYDROGENASE: LDH: 222 U/L — ABNORMAL HIGH (ref 98–192)

## 2019-07-19 LAB — MAGNESIUM: Magnesium: 1.9 mg/dL (ref 1.7–2.4)

## 2019-07-19 LAB — GLUCOSE, CAPILLARY
Glucose-Capillary: 127 mg/dL — ABNORMAL HIGH (ref 70–99)
Glucose-Capillary: 118 mg/dL — ABNORMAL HIGH (ref 70–99)
Glucose-Capillary: 106 mg/dL — ABNORMAL HIGH (ref 70–99)

## 2019-07-19 MED ORDER — ALTEPLASE 2 MG IJ SOLR
2.0000 mg | Freq: Once | INTRAMUSCULAR | Status: AC
  Administered 2019-07-19: 2 mg
  Filled 2019-07-19: qty 2

## 2019-07-19 MED ORDER — POTASSIUM CHLORIDE CRYS ER 10 MEQ PO TBCR
40.0000 meq | EXTENDED_RELEASE_TABLET | Freq: Once | ORAL | Status: AC
  Administered 2019-07-19: 40 meq via ORAL
  Filled 2019-07-19: qty 4

## 2019-07-19 MED ORDER — TORSEMIDE 20 MG PO TABS
20.0000 mg | ORAL_TABLET | Freq: Every day | ORAL | Status: DC
  Administered 2019-07-19 – 2019-07-21 (×3): 20 mg via ORAL
  Filled 2019-07-19 (×5): qty 1

## 2019-07-19 MED ORDER — MAGNESIUM SULFATE 2 GM/50ML IV SOLN
2.0000 g | Freq: Once | INTRAVENOUS | Status: AC
  Administered 2019-07-19: 2 g via INTRAVENOUS
  Filled 2019-07-19: qty 50

## 2019-07-19 MED ORDER — WARFARIN SODIUM 5 MG PO TABS
5.0000 mg | ORAL_TABLET | Freq: Once | ORAL | Status: AC
  Administered 2019-07-19: 5 mg via ORAL
  Filled 2019-07-19: qty 1

## 2019-07-19 NOTE — Progress Notes (Signed)
Patient ID: AIDENN SKELLENGER, male   DOB: 1965/03/21, 54 y.o.   MRN: [id number]     Advanced Heart Failure Rounding Note  PCP-Cardiologist: No primary care provider on file.   Subjective:    - HM3 placement 6/24  - Had 2 episodes of VF requiring emergent DC-CV on 07/02/19 - Recurrent VF on 7/2 and broke spontaneously.  - 7/3 Recurrent VF at 2a.-> emergent DC-CV.  - 07/07/2019 VT/Torsades -> repeat DC_CV - 07/08/2019 VF/Torsades x2--> DC-CV x2.  - 07/09/19 developed sepsis/PNA -> intubated - 07/09/19 Multiple episodes of VT/VF -> IABP placed. AV pacing via EPW - 07/14/19 IABP removed and extubated. Pacing discontinued.   Co-ox 56%, CVP 6.  Main complaint is excess urination.  Walking in halls, mildly winded when he gets back to his room.     Remains on heparin bridge. INR 1.2.  LDH 222.   LVAD Interrogation HM III: Speed: 5200 Flow: 3.8 PI: 5.4 Power: 3.7.  1 PI events in 24 hrs.   Telemetry: NSR, no ventricular arrhythmias.   Objective:   Weight Range: 87.5 kg Body mass index is 28.49 kg/m.   Vital Signs:   Temp:  [97.6 F (36.4 C)-97.9 F (36.6 C)] 97.9 F (36.6 C) (07/18 0751) Pulse Rate:  [35-94] 94 (07/18 0751) Resp:  [10-20] 11 (07/18 0546) BP: (86-115)/(43-90) 90/68 (07/18 0751) SpO2:  [92 %-100 %] 100 % (07/18 0231) Weight:  [87.5 kg] 87.5 kg (07/18 0231) Last BM Date: 07/19/19  Weight change: Filed Weights   07/17/19 0500 07/18/19 0239 07/19/19 0231  Weight: 94.2 kg 91 kg 87.5 kg    Intake/Output:   Intake/Output Summary (Last 24 hours) at 07/19/2019 0903 Last data filed at 07/19/2019 0414 Gross per 24 hour  Intake 790.4 ml  Output 2865 ml  Net -2074.6 ml      Physical Exam  CVP 6 General: Well appearing this am. NAD.  HEENT: Normal. Neck: Supple, JVP 7-8 cm. Carotids OK.  Cardiac:  Mechanical heart sounds with LVAD hum present.  Lungs:  CTAB, normal effort.  Abdomen:  NT, ND, no HSM. No bruits or masses. +BS  LVAD exit site: Well-healed and  incorporated. Dressing dry and intact. No erythema or drainage. Stabilization device present and accurately applied. Driveline dressing changed daily per sterile technique. Extremities:  Warm and dry. No cyanosis, clubbing, rash, or edema.  Neuro:  Alert & oriented x 3. Cranial nerves grossly intact. Moves all 4 extremities w/o difficulty. Affect pleasant     Telemetry  NSR 60s   Labs    CBC Recent Labs    07/18/19 0359 07/19/19 0813  WBC 7.0 6.9  HGB 10.4* 11.2*  HCT 33.9* 35.4*  MCV 94.7 94.4  PLT 203 938   Basic Metabolic Panel Recent Labs    07/17/19 0416 07/17/19 0416 07/18/19 0359 07/19/19 0632  NA 138  --  136  --   K 3.8  --  3.9  --   CL 102  --  99  --   CO2 25  --  27  --   GLUCOSE 103*  --  98  --   BUN 16  --  16  --   CREATININE 1.19  --  1.18  --   CALCIUM 8.4*  --  8.6*  --   MG 2.1   < > 1.8 1.9   < > = values in this interval not displayed.   Liver Function Tests No results for input(s): AST, ALT, ALKPHOS, BILITOT,  PROT, ALBUMIN in the last 72 hours. No results for input(s): LIPASE, AMYLASE in the last 72 hours. Cardiac Enzymes No results for input(s): CKTOTAL, CKMB, CKMBINDEX, TROPONINI in the last 72 hours.  BNP: BNP (last 3 results) Recent Labs    07/02/19 0531 07/09/19 0313 07/16/19 0346  BNP 1,646.4* 1,815.2* 3,431.7*    ProBNP (last 3 results) No results for input(s): PROBNP in the last 8760 hours.   D-Dimer No results for input(s): DDIMER in the last 72 hours. Hemoglobin A1C No results for input(s): HGBA1C in the last 72 hours. Fasting Lipid Panel No results for input(s): CHOL, HDL, LDLCALC, TRIG, CHOLHDL, LDLDIRECT in the last 72 hours. Thyroid Function Tests No results for input(s): TSH, T4TOTAL, T3FREE, THYROIDAB in the last 72 hours.  Invalid input(s): FREET3  Other results:   Imaging    No results found.   Medications:     Scheduled Medications: . amiodarone  400 mg Oral BID  . aspirin  81 mg Oral Daily    . atorvastatin  80 mg Oral Daily  . bisacodyl  10 mg Oral Daily   Or  . bisacodyl  10 mg Rectal Daily  . chlorhexidine  15 mL Mouth Rinse BID  . Chlorhexidine Gluconate Cloth  6 each Topical Daily  . clonazePAM  0.5 mg Oral BID  . colchicine  0.6 mg Oral BID  . digoxin  0.125 mg Oral Daily  . docusate sodium  100 mg Oral Daily  . folic acid  1 mg Oral Daily  . gabapentin  300 mg Oral TID  . insulin aspart  0-5 Units Subcutaneous QHS  . insulin aspart  0-9 Units Subcutaneous TID WC  . mouth rinse  15 mL Mouth Rinse q12n4p  . melatonin  5 mg Oral QPC supper  . multivitamin with minerals  1 tablet Oral Daily  . pantoprazole  40 mg Oral Daily  . polyethylene glycol  17 g Oral Daily  . potassium chloride  40 mEq Oral BID  . ranolazine  500 mg Oral BID  . sodium chloride flush  10-40 mL Intracatheter Q12H  . sodium chloride flush  3 mL Intravenous Q12H  . sodium chloride flush  3 mL Intravenous Q12H  . thiamine  100 mg Oral Daily  . torsemide  20 mg Oral Daily  . Warfarin - Pharmacist Dosing Inpatient   Does not apply q1600    Infusions: . sodium chloride    . sodium chloride Stopped (07/15/19 0129)  . heparin 500 Units/hr (07/18/19 1958)  . lactated ringers      PRN Medications: sodium chloride, acetaminophen, ALPRAZolam, bisacodyl, calcium carbonate, dextrose, fentaNYL (SUBLIMAZE) injection, ondansetron (ZOFRAN) IV, oxyCODONE, pneumococcal 23 valent vaccine, polyvinyl alcohol, sodium chloride flush, sodium chloride flush, traMADol, traZODone    Patient Profile   Meshach Perry Hiltunen --post MI cardiogenic shock/ optimization prior to anticipated CABG, at the request of Dr. Marlou Porch, Cardiology.    Assessment/Plan   1. Refractory VT/VF  - developed VF on 7/1 x 2 requiring emergent DC-CV - On 7/2 had self-terminating episode - On 7/3 persistent VF/torsades -> DC-CV - On 7/6  VF/torsades.  - On 7/7 VF /Torsades on 2 separate occasions-> DC-CV x2.  - On 7/8 multiple episodes  on VT/VF (incessant) -> unclear which intervention broke it   - Now off mexiletine and on amiodarone 400 mg bid.  -  keep K> 4.0 Mg >2.0. Needs BMET/Mg drawn this morning.  - VAD speed initially reduced to 5000 but output  low. Now at 5200.  - Keep off inotropes with VT.  - No ventricular arrhythmias on telemetry.   2. Acute Anterolateral STEMI/ Multivessel CAD - Emergent cath showed severe multivessel disease w/ 75% mid- distal LM, 100% prox LAD, not amendable to PCI (unable to cross w/ guide wire, probable CTO), 100% prox LCx (probable CTO) and 100% ostial RCA (probable CTO). There were mild to moderate collaterals right to left to the LAD, minimal right to right collaterals an minimal left to left collaterals. LVEF 25% - Hs trop peaked 27,000. - No targets for CABG / PCI.   - S/P HMIII VAD - suspect VF was ischemically-mediated  3.  Acute Systolic Heart Failure --->Cardiogenic Shock - 2/2 acute MI. LVEF by LVG 25% - IABP out 6/18 but replaced 6/23. Removed in OR on 6/24  - HMIII LVAD  Placed 6/24 - Extubated 6/25  - Was on milrinone for RV failure. Now off due to VF.  - IABP removed 7/13 - CO-OX mildly low at 56%.  - CVP 6, looks euvolemic.  Can decrease torsemide to 20 mg daily.   - Midodrine stopped, MAP 75 today.  - Continue digoxin for RV support.   - LDH  Peaked  2600 but now down to 222. No evidence pump thrombosis  - Ramp echo 7/8 RV only mildly reduced. Good cannula position. Increase VAD speed as tolerated, currently 5200.  Would repeat ramp echo prior to discharge (early this week) now that ventricular arrhythmias have calmed, may be able to increase speed further.  - INR 1.2. Continue coumadin + heparin bridge. Discussed dosing w/ pharmacy   4. Acute hypoxic respiratory failure - intubated 7/8. Extubated 7/13 - Chest CT with bilateral PNA R>L - CCM following - completed abx therapy w/ vanc/cefipime - PCT 1.97 => 1.3 => 0.6.  - BCx and BAL NGTD - Currently AF w/ normal  WBC ct   5. Shock liver - Resolved.  6. Tobacco Abuse - 30 pack years  - smoking cessation advised  - Will need to quit if to be considered for heart transplant in the future   7. Renal mass = RCC - MRI abdomen- 1.8 cm right renal lesion, most consistent with renal cell carcinoma. No evidence of renal vein involvement or nodal metastasis. - Discussed w/ Urology and R RCC is small and will just require surveillance.  Ok to proceed with VAD from their standpoint.   8. Hyponatremia  - Resolved.   9. Sleep Apnea - frequent apnea at night  - needs formal sleep study   - CPAP ordered while inpatient  10. Anemia - Post-op, increasing hgb 11.2.   11. Debility - continue to mobilize w/ PT, walking more.  - CIR recommended. Consult placed    Loralie Champagne, MD  07/19/2019 9:03 AM

## 2019-07-19 NOTE — Progress Notes (Signed)
Talked to TCTS PA Tessa regarding epi wires. She didn't remove epi wires today, it is better asking Dr. Orvan Seen tomorrow then make decision. There was no drive line dressing kit in the hospital. Patient need to change dressing tomorrow. HS [employer]

## 2019-07-19 NOTE — Progress Notes (Signed)
[place] for Coumadin + heparin Indication: LVAD  Allergies  Allergen Reactions  . Codeine Hives    Patient Measurements: Height: 5\' 9"  (175.3 cm) Weight: 87.5 kg (192 lb 14.4 oz) IBW/kg (Calculated) : 70.7 Heparin Dosing Weight: ~ 92 kg  Vital Signs: Temp: 97.5 F (36.4 C) (07/18 1247) Temp Source: Oral (07/18 1247) BP: 104/82 (07/18 1247) Pulse Rate: 94 (07/18 0751)  Labs: Recent Labs    07/17/19 0416 07/17/19 0416 07/18/19 0359 07/19/19 0632 07/19/19 0813  HGB 10.2*   < > 10.4*  --  11.2*  HCT 33.4*  --  33.9*  --  35.4*  PLT 199  --  203  --  232  LABPROT 15.4*  --  14.9 14.7  --   INR 1.3*  --  1.2 1.2  --   HEPARINUNFRC <0.10*  --  <0.10* <0.10*  --   CREATININE 1.19  --  1.18 1.22  --    < > = values in this interval not displayed.    Estimated Creatinine Clearance: 74.9 mL/min (by C-G formula based on SCr of 1.22 mg/dL).  Assessment: 54 yo male s/p LVAD HM3 placement 6/24.  Pharmacy asked to dose  anticoagulation with IV heparin and Coumadin. No overt bleeding or complications noted.    IABP was pulled and pt extubated on 7/13.   INR down 1.2 today after three low doses of warfarin, inc dose yesterday with no change will give another dose tonight  - previously sensitive to inc warfarin doses and have been conservative but now patient moe stable, eating more . Will give boost dose today. Hgb stable at 10-11, plt 200s. LDH mich improved down 222. Heparin level remains undetectable on fixed low-dose heparin 500 units/hr.    Goal of Therapy:  Heparin level: 0.2-0.5 - currently planned for fixed dose heparin  INR 2-2.5  Monitor platelets by anticoagulation protocol: Yes   Plan:  -Warfarin 5mg  PO x1 today -Continue heparin infusion at 500 units/hr until INR therapeutic -Daily INR, CBC, LDH, s/sx of bleeding, HL  Bonnita Nasuti Pharm.D. CPP, BCPS Clinical Pharmacist [phone] 07/19/2019 1:23 PM    Please check  [email or web address] for unit-specific pharmacy phone numbers.

## 2019-07-19 NOTE — Progress Notes (Signed)
IV team consult placed.  RDL Picc unable to draw blood. Flushed multiple times with NS flushes.  Got blood in transducer but would not draw enough for labs.  Tried both ports still unable to draw.  Tried moving arm to different position, with multiple ways.  Another RN at bedside also tried multiple ways to draw blood still unable.  Will continue to monitor. Saunders Revel T

## 2019-07-19 NOTE — Plan of Care (Signed)
  Problem: Education: Goal: Understanding of medication regimen will improve Outcome: Progressing Goal: Individualized Educational Video(s) Outcome: Progressing   Problem: Activity: Goal: Ability to tolerate increased activity will improve Outcome: Progressing   Problem: Cardiac: Goal: Ability to achieve and maintain adequate cardiopulmonary perfusion will improve Outcome: Progressing   Problem: Education: Goal: Knowledge of General Education information will improve Description: Including pain rating scale, medication(s)/side effects and non-pharmacologic comfort measures Outcome: Progressing   Problem: Health Behavior/Discharge Planning: Goal: Ability to manage health-related needs will improve Outcome: Progressing   Problem: Clinical Measurements: Goal: Diagnostic test results will improve Outcome: Progressing Goal: Respiratory complications will improve Outcome: Progressing

## 2019-07-19 NOTE — Progress Notes (Signed)
Patient's oral intake is poor and it is hard time to taking medications. Vomiting x 1 right after taking potassium liquid. Administered PRN zofran 4 mg IVP. Patient has loose BM, hold stool softener. Patient still has epi wire, Will ask TCTS team regarding this matter. HS [employer]

## 2019-07-20 LAB — LACTATE DEHYDROGENASE: LDH: 280 U/L — ABNORMAL HIGH (ref 98–192)

## 2019-07-20 LAB — CBC
HCT: 37 % — ABNORMAL LOW (ref 39.0–52.0)
Hemoglobin: 11.5 g/dL — ABNORMAL LOW (ref 13.0–17.0)
MCH: 29.6 pg (ref 26.0–34.0)
MCHC: 31.1 g/dL (ref 30.0–36.0)
MCV: 95.4 fL (ref 80.0–100.0)
Platelets: 239 10*3/uL (ref 150–400)
RBC: 3.88 MIL/uL — ABNORMAL LOW (ref 4.22–5.81)
RDW: 16 % — ABNORMAL HIGH (ref 11.5–15.5)
WBC: 7.2 10*3/uL (ref 4.0–10.5)
nRBC: 0 % (ref 0.0–0.2)

## 2019-07-20 LAB — COOXEMETRY PANEL
Carboxyhemoglobin: 1.6 % — ABNORMAL HIGH (ref 0.5–1.5)
Carboxyhemoglobin: 1.3 % (ref 0.5–1.5)
Carboxyhemoglobin: 1.6 % — ABNORMAL HIGH (ref 0.5–1.5)
Methemoglobin: 0.6 % (ref 0.0–1.5)
Methemoglobin: 0.7 % (ref 0.0–1.5)
Methemoglobin: 0.7 % (ref 0.0–1.5)
O2 Saturation: 46.7 %
O2 Saturation: 43.5 %
O2 Saturation: 60 %
Total hemoglobin: 11.9 g/dL — ABNORMAL LOW (ref 12.0–16.0)
Total hemoglobin: 16.9 g/dL — ABNORMAL HIGH (ref 12.0–16.0)
Total hemoglobin: 12.1 g/dL (ref 12.0–16.0)

## 2019-07-20 LAB — BASIC METABOLIC PANEL
Anion gap: 10 (ref 5–15)
BUN: 15 mg/dL (ref 6–20)
CO2: 30 mmol/L (ref 22–32)
Calcium: 9.1 mg/dL (ref 8.9–10.3)
Chloride: 96 mmol/L — ABNORMAL LOW (ref 98–111)
Creatinine, Ser: 1.25 mg/dL — ABNORMAL HIGH (ref 0.61–1.24)
GFR calc Af Amer: >60 mL/min (ref >60)
GFR calc non Af Amer: >60 mL/min (ref >60)
Glucose, Bld: 112 mg/dL — ABNORMAL HIGH (ref 70–99)
Potassium: 4.1 mmol/L (ref 3.5–5.1)
Sodium: 136 mmol/L (ref 135–145)

## 2019-07-20 LAB — HEPARIN LEVEL (UNFRACTIONATED): Heparin Unfractionated: <0.1 IU/mL — ABNORMAL LOW (ref 0.30–0.70)

## 2019-07-20 LAB — GLUCOSE, CAPILLARY
Glucose-Capillary: 113 mg/dL — ABNORMAL HIGH (ref 70–99)
Glucose-Capillary: 114 mg/dL — ABNORMAL HIGH (ref 70–99)
Glucose-Capillary: 79 mg/dL (ref 70–99)
Glucose-Capillary: 102 mg/dL — ABNORMAL HIGH (ref 70–99)

## 2019-07-20 LAB — PROTIME-INR
INR: 1.3 — ABNORMAL HIGH (ref 0.8–1.2)
Prothrombin Time: 15.4 seconds — ABNORMAL HIGH (ref 11.4–15.2)

## 2019-07-20 LAB — MAGNESIUM: Magnesium: 1.9 mg/dL (ref 1.7–2.4)

## 2019-07-20 MED ORDER — MAGNESIUM SULFATE 2 GM/50ML IV SOLN
2.0000 g | Freq: Once | INTRAVENOUS | Status: AC
  Administered 2019-07-20: 2 g via INTRAVENOUS
  Filled 2019-07-20: qty 50

## 2019-07-20 MED ORDER — WARFARIN SODIUM 3 MG PO TABS
6.0000 mg | ORAL_TABLET | Freq: Once | ORAL | Status: AC
  Administered 2019-07-20: 6 mg via ORAL
  Filled 2019-07-20: qty 2

## 2019-07-20 MED ORDER — POTASSIUM CHLORIDE CRYS ER 20 MEQ PO TBCR
40.0000 meq | EXTENDED_RELEASE_TABLET | Freq: Two times a day (BID) | ORAL | Status: DC
  Administered 2019-07-20 – 2019-07-21 (×3): 40 meq via ORAL
  Filled 2019-07-20 (×3): qty 2

## 2019-07-20 NOTE — Plan of Care (Signed)
  Problem: Activity: Goal: Ability to tolerate increased activity will improve Outcome: Progressing   Problem: Cardiac: Goal: Ability to achieve and maintain adequate cardiopulmonary perfusion will improve Outcome: Progressing   Problem: Education: Goal: Knowledge of General Education information will improve Description: Including pain rating scale, medication(s)/side effects and non-pharmacologic comfort measures Outcome: Progressing   Problem: Health Behavior/Discharge Planning: Goal: Ability to manage health-related needs will improve Outcome: Progressing   Problem: Clinical Measurements: Goal: Will remain free from infection Outcome: Progressing Goal: Diagnostic test results will improve Outcome: Progressing   Problem: Activity: Goal: Risk for activity intolerance will decrease Outcome: Progressing

## 2019-07-20 NOTE — Progress Notes (Signed)
LVAD Coordinator Rounding Note:  Transferred from [facility] ED on 06/17/19 with STEMI. Cath revealed multi-vessel CAD, wire would not cross to open LAD; IABP placed and pt transferred here for evaluation for CABG.   Dr. Orvan Seen evaluated and found he was not amenable to surgical or percutaneous revascularization due to poor targets. With severe depressed LV function and little coronary perfusion, LVAD was recommended.   HM III LVAD implanted on 06/25/19 by Dr. Orvan Seen under Destination Therapy criteria due to recent smoking history.  Developed sepsis/PNA with respiratory failure. Had refractory VT/VF. IABP/swan placed 07/09/19 - discontinued 07/14/19  Pt laying in bed napping upon my arrival. Discussed plan for CIR and need for discharge teaching. See teaching note below. Patient states he walked in the hall this morning, and sat in the recliner for a few hours. Having some mild sternal pain, but otherwise is pain free.  Pt has red rash from under drive line dressing, tracking along left flank. Denies pain, but says it feels "itchy." See exit site note below. Will trial dressing change with NO SILVER OR CALCIUM ALGINATE due to intolerance.    Plan for discharge teaching with [name] (pt's sister) and Otila Kluver (pt's ex-wife) tomorrow at 10:00.   Vital signs: Temp: 97.4 HR: 68 SR w/ PVCs Auto BP: 121/85 (94) Doppler: 90 O2 Sat: 94% on RA Wt:207>225.7>222.6>214.3>213.4>203.9>205.3>209>212......207>207.9>207.6>212.1>207.6>193.7 lbs  LVAD parameters: Speed:  5200 Flow: 4.0 Power:  3.7w PI: 4.8 Hct: 33  Alarms: none Events: rare  Fixed speed: 5200 Low speed limit: 4800   Drive Line: Drive line dressing was not changed yesterday due to unavailability of dressing kit per central supply / TEFL teacher. VAD coordinator was not contacted re: dressing kit availability. Drive line anchor in place, but drive line not secured in anchor. Discussed importance of having drive line secure with  both patient and bedside RN. Both verbalized understanding.   Existing VAD dressing removed and site care performed using sterile technique. Drive line exit site cleaned with Chlora prep applicators x 2, allowed to dry, and gauze dressing WITHOUT calcium aliginate strip re-applied. Exit site unincorporated, erythema around exit site persists, small amount yellow/bloody drainage with no foul odor noted. Red rash noted from drive line tracking along left flank. Pt endorses itching at drive line exit site and along flank. Will stop use of calcium alginate strip at this time. Will discuss exit site care plan in [facility] today. The velour is fully implanted at exit site. Anchor re-applied around drive line. Daily dressing changes per VAD Coordinator, Nurse Davonna Belling, or trained caregiver. Next dressing change due: 07/21/19.         Labs:  LDH trend: 110>315>945>859>292>446>286>381>771>1657>9038>333>832>919>166>060>045>997>741  INR trend: 1.4>1.3>1.1>1.2>1.4>1.5>1.7>2.7>3.6>5.9>4.1>4.7>4.7>4.2>3.8>1.6>1.4>1.5>1.3>1.3  WBC trend: 22.9>19.2>13.5>12.3>10.1>8.0>12.1>9.5>8.0>7.2  AST/ALT: 1854/1377>3004/2497>1441/2080>>686/1554>403/1162> 423/953>202/334   Anticoagulation Plan: -INR Goal:  2.0 - 2.5 -ASA Dose: 81 mg   Blood Products:  -Intra-op - 06/25/19>>6 units FFP  - 07/09/19>>2 FFP - 07/10/19>>2 FFP  Device: N/A  Drips: Amiodarone>>87m/hr Heparin>>500 u/hr  Tube Feeding: stopped 07/14/19  Arrythmias: was on Amiodarone pre implant for NSVT - 2 episodes of VF requiring emergent DC-CV on 07/02/19 - Recurrent VF on 7/2 and broke spontaneously.  - 7/3 Recurrent VF at 2a.-> emergent DC-CV.  - 07/07/2019 VT/Torsades -> repeat DC_CV - 07/08/2019 VF/Torsades x2--> DC-CV x2.  - 07/09/19 Multiple episodes of VT/VF -> IABP placed. AV pacing via external pacing wires - underlying rhythm bradycardia 40 bpm; external AV pacing wires remain intact  Respiratory: extubated 06/26/19 - re-intubated 07/09/19 for  sepsis/pneumoni -  extubated 07/14/19   Nitric Oxide: off 06/26/19  - Restarted 07/10/19 - Off 07/14/19  VAD Education: 1. Sister-Joanne is checked off to perform driveline dressing changes independently. Not currently at bedside. 2. Pt still has not reviewed VAD education notebook or patient handbook as has been requested since last week. When asked why, he replied "I haven't had time." Had long discussion re: reviewing education materials in preparation for discharge teaching. Bedside RN made aware of need to review education materials.  3. Pt performed self test and power source change with no verbal cues from VAD coordinator. Able to answer simple equipment related questions.  4. Plan to begin discharge teaching tomorrow at 10:00 with patient's sister [name] and ex-wife Otila Kluver.     Plan/Recommendations:  1. Call VAD pager if any VAD equipment or drive line issues. 2. Daily day dressing changes per VAD Coordinator, Nurse Davonna Belling, or pts sister.   Emerson Monte RN [place] Coordinator  Office: [phone]  24/7 Pager: [phone]

## 2019-07-20 NOTE — PMR Pre-admission (Signed)
PMR Admission Coordinator Pre-Admission Assessment  Patient: Aaron [name] is an 54 y.o., male MRN: [id number] DOB: May 01, 1965 Height: '5\' 9"'  (175.3 cm) Weight: 83.6 kg  Insurance Information HMO:     PPO: yes     PCP:      IPA:      80/20:      OTHER:  PRIMARY: [employer] [place]      Policy#: [id number]      Subscriber: patient CM Name: Merrilyn Puma      Phone#: [phone]     Fax#: [phone] Pre-Cert#: [id number]      Employer:  Josem Kaufmann provided by fax from Merrilyn Puma for admit to CIR with effective dates: 07/21/19 with clinical updates due 08/11/19 to [place] (p): [phone] (f): [phone]; confirmed with Donaciano Eva is still good for an admit 07/23/19.  Benefits:  Phone #: [phone]     Name:  Eff. Date: 12/02/2018 - still active     Deduct: $2,750 ($2,750 met)      Out of Pocket Max: $5,500 (includes deductible - [phone] met)      Life Max: NA CIR: 80% coverage, 20% co-insurance      SNF: 80% coverage, 20% co-insurance; limited to 60 days/cal yr Outpatient: 80% coverage, 20% co-insurance; limited to 30 combined PT/OT visits and 30 ST visits     Home Health: 80% coverage, 20% co-insurance      DME: 80% coverage, 20% co-insurance    Providers:  SECONDARY: None      Policy#:      Phone#:   Development worker, community:       Phone#:   The Engineer, petroleum" for patients in Inpatient Rehabilitation Facilities with attached "Privacy Act [place] Records" was provided and verbally reviewed with: N/A  Emergency Contact Information Contact Information    Name Relation Home Work Hallock, [place] Sister   [phone]   Dima, Ferrufino Other   [phone]      Current Medical History   Patient Admitting Diagnosis: Cardiac debility, new LVAD  History of Present Illness: Aaron [name] is a 54 year old right-handed male with history of hypertension and tobacco abuse systolic congestive heart failure who is on no prescription medications.   Per chart review patient lives alone independent prior to admission 1 level home 3 steps to entry.  He does have family in the area.  Presented 06/17/2019 to [facility] with acute onset of chest pain.  EKG in the ED showed normal sinus rhythm rate of 96 bpm ST elevation in V1-V3, aVR.  ST depressions in inferior lateral leads and Q waves in V1-V3 for diagnosis of STEMI and was taken to the cardiac Cath Lab found to have multivessel CAD.  Attempts were made to open the LAD but the wire would not cross.  Echocardiogram showed ejection fraction of 25%.  Patient was transferred to [facility].  Patient underwent LVAD implantation 06/25/2019 per Dr. Orvan Seen.  Hospital course complicated by 2 episodes of VF requiring emergent DC-CV on 07/02/2019 with recurrent VF on 07/03/2019 and broke spontaneously.  He did develop sepsis 07/09/2019 pneumonia quiring intubation for a short time and extubated 07/14/2019.  Follow-up cardiology services for systolic congestive heart failure requiring midodrine.  Chronic Coumadin has been initiated.  During hospital course findings incidental of renal mass on MRI of abdomen 1.8 cm right renal lesion consistent with renal cell carcinoma.  No evidence of renal vein involvement or nodal metastasis.  Discussed with urology plan at this time  requiring surveillance and would follow-up outpatient.  Palliative care has been consulted to establish goals of care.  He is tolerating a regular consistency diet.  Therapy evaluations completed and patient is to be admitted for a comprehensive rehab program on 07/23/19.    Patient's medical record from [facility] has been reviewed by the rehabilitation admission coordinator and physician.  Past Medical History  Past Medical History:  Diagnosis Date  . Acute anterolateral wall MI ([place])   . CAD, multiple vessel   . Hypertension   . Ischemic cardiomyopathy   . Systolic heart failure ([place])   . Tobacco abuse     Family  History   family history includes CAD in his father; Valvular heart disease in his father.  Prior Rehab/Hospitalizations Has the patient had prior rehab or hospitalizations prior to admission? No  Has the patient had major surgery during 100 days prior to admission? Yes   Current Medications  Current Facility-Administered Medications:  .  0.9 %  sodium chloride infusion, 250 mL, Intravenous, Continuous, Clegg, Amy D, NP .  0.9 %  sodium chloride infusion, 250 mL, Intravenous, PRN, Clegg, Amy D, NP, Stopped at 07/15/19 0129 .  acetaminophen (TYLENOL) tablet 650 mg, 650 mg, Oral, Q4H PRN, Clegg, Amy D, NP .  ALPRAZolam (XANAX) tablet 0.25 mg, 0.25 mg, Oral, TID PRN, Clegg, Amy D, NP, 0.25 mg at 07/15/19 1657 .  amiodarone (PACERONE) tablet 400 mg, 400 mg, Oral, BID, Clegg, Amy D, NP, 400 mg at 07/22/19 2147 .  aspirin chewable tablet 81 mg, 81 mg, Oral, Daily, Clegg, Amy D, NP, 81 mg at 07/22/19 0828 .  atorvastatin (LIPITOR) tablet 80 mg, 80 mg, Oral, Daily, Clegg, Amy D, NP, 80 mg at 07/22/19 0827 .  bisacodyl (DULCOLAX) EC tablet 10 mg, 10 mg, Oral, Daily, 10 mg at 07/13/19 0827 **OR** bisacodyl (DULCOLAX) suppository 10 mg, 10 mg, Rectal, Daily, Clegg, Amy D, NP, 10 mg at 07/12/19 0920 .  bisacodyl (DULCOLAX) suppository 10 mg, 10 mg, Rectal, Daily PRN, Clegg, Amy D, NP, 10 mg at 06/27/19 1902 .  calcium carbonate (TUMS - dosed in mg elemental calcium) chewable tablet 200 mg of elemental calcium, 200 mg of elemental calcium, Oral, TID PRN, Clegg, Amy D, NP, 200 mg of elemental calcium at 07/18/19 1629 .  chlorhexidine (PERIDEX) 0.12 % solution 15 mL, 15 mL, Mouth Rinse, BID, Clegg, Amy D, NP, 15 mL at 07/21/19 2040 .  Chlorhexidine Gluconate Cloth 2 % PADS 6 each, 6 each, Topical, Daily, Clegg, Amy D, NP, 6 each at 07/22/19 0834 .  clonazePAM (KLONOPIN) tablet 0.5 mg, 0.5 mg, Oral, BID, Clegg, Amy D, NP, 0.5 mg at 07/22/19 2147 .  colchicine tablet 0.6 mg, 0.6 mg, Oral, BID, Clegg, Amy D,  NP, 0.6 mg at 07/22/19 2147 .  dextrose 50 % solution 0-50 mL, 0-50 mL, Intravenous, PRN, Clegg, Amy D, NP .  digoxin (LANOXIN) tablet 0.125 mg, 0.125 mg, Oral, Daily, Clegg, Amy D, NP, 0.125 mg at 07/22/19 0828 .  docusate sodium (COLACE) capsule 100 mg, 100 mg, Oral, Daily, Clegg, Amy D, NP, 100 mg at 07/20/19 0856 .  feeding supplement (ENSURE ENLIVE) (ENSURE ENLIVE) liquid 237 mL, 237 mL, Oral, BID BM, Atkins, Glenice Bow, MD, 237 mL at 07/22/19 1211 .  fentaNYL (SUBLIMAZE) injection 50 mcg, 50 mcg, Intravenous, Q1H PRN, Clegg, Amy D, NP, 50 mcg at 13/14/38 8875 .  folic acid (FOLVITE) tablet 1 mg, 1 mg, Oral, Daily, Clegg, Amy D,  NP, 1 mg at 07/22/19 0828 .  gabapentin (NEURONTIN) tablet 300 mg, 300 mg, Oral, TID, Clegg, Amy D, NP, 300 mg at 07/22/19 2148 .  hydrocortisone cream 1 %, , Topical, TID, Bensimhon, Shaune Pascal, MD, 1 application at 72/90/21 2148 .  insulin aspart (novoLOG) injection 0-5 Units, 0-5 Units, Subcutaneous, QHS, Clegg, Amy D, NP .  insulin aspart (novoLOG) injection 0-9 Units, 0-9 Units, Subcutaneous, TID WC, Clegg, Amy D, NP, 1 Units at 07/22/19 1210 .  lactated ringers infusion, , Intravenous, Continuous, Clegg, Amy D, NP .  magnesium oxide (MAG-OX) tablet 400 mg, 400 mg, Oral, Daily, Orvan Seen, Glenice Bow, MD, 400 mg at 07/22/19 0827 .  MEDLINE mouth rinse, 15 mL, Mouth Rinse, q12n4p, Clegg, Amy D, NP, 15 mL at 07/14/19 2201 .  melatonin tablet 5 mg, 5 mg, Oral, QPC supper, Clegg, Amy D, NP, 5 mg at 07/22/19 2148 .  multivitamin with minerals tablet 1 tablet, 1 tablet, Oral, Daily, Clegg, Amy D, NP, 1 tablet at 07/22/19 0827 .  ondansetron (ZOFRAN) injection 4 mg, 4 mg, Intravenous, Q6H PRN, Clegg, Amy D, NP, 4 mg at 07/19/19 1005 .  oxyCODONE (Oxy IR/ROXICODONE) immediate release tablet 5-10 mg, 5-10 mg, Oral, Q3H PRN, Clegg, Amy D, NP, 10 mg at 07/22/19 0828 .  pantoprazole (PROTONIX) EC tablet 40 mg, 40 mg, Oral, Daily, Clegg, Amy D, NP, 40 mg at 07/22/19 0827 .   pneumococcal 23 valent vaccine (PNEUMOVAX-23) injection 0.5 mL, 0.5 mL, Intramuscular, Prior to discharge, Atkins, Glenice Bow, MD .  polyethylene glycol (MIRALAX / GLYCOLAX) packet 17 g, 17 g, Oral, Daily, Clegg, Amy D, NP, 17 g at 07/14/19 0947 .  polyvinyl alcohol (LIQUIFILM TEARS) 1.4 % ophthalmic solution 1 drop, 1 drop, Both Eyes, PRN, Clegg, Amy D, NP .  ranolazine (RANEXA) 12 hr tablet 500 mg, 500 mg, Oral, BID, Clegg, Amy D, NP, 500 mg at 07/22/19 2147 .  sodium chloride flush (NS) 0.9 % injection 10-40 mL, 10-40 mL, Intracatheter, Q12H, Clegg, Amy D, NP, 10 mL at 07/22/19 2151 .  sodium chloride flush (NS) 0.9 % injection 10-40 mL, 10-40 mL, Intracatheter, PRN, Clegg, Amy D, NP .  sodium chloride flush (NS) 0.9 % injection 3 mL, 3 mL, Intravenous, Q12H, Clegg, Amy D, NP, 3 mL at 07/22/19 2152 .  sodium chloride flush (NS) 0.9 % injection 3 mL, 3 mL, Intravenous, Q12H, Clegg, Amy D, NP, 3 mL at 07/21/19 2043 .  sodium chloride flush (NS) 0.9 % injection 3 mL, 3 mL, Intravenous, PRN, Clegg, Amy D, NP, 3 mL at 07/11/19 2113 .  thiamine tablet 100 mg, 100 mg, Oral, Daily, Clegg, Amy D, NP, 100 mg at 07/22/19 0828 .  traMADol (ULTRAM) tablet 50-100 mg, 50-100 mg, Oral, Q4H PRN, Clegg, Amy D, NP, 100 mg at 07/20/19 1707 .  traZODone (DESYREL) tablet 100 mg, 100 mg, Oral, QHS PRN, Clegg, Amy D, NP .  warfarin (COUMADIN) tablet 1 mg, 1 mg, Oral, ONCE-1600, Atkins, Glenice Bow, MD .  Warfarin - Pharmacist Dosing Inpatient, , Does not apply, q1600, Clegg, Amy D, NP, Given at 07/22/19 1743  Patients Current Diet:  Diet Order            Diet Heart Room service appropriate? Yes; Fluid consistency: Thin  Diet effective now                 Precautions / Restrictions Precautions Precautions: Fall, Sternal Precaution Comments: LVAD Restrictions Weight Bearing Restrictions: Yes (sternal precautions) RLE Weight  Bearing: Non weight bearing LLE Weight Bearing: Non weight bearing Other  Position/Activity Restrictions: STERNAL PRECAUTIONS   Has the patient had 2 or more falls or a fall with injury in the past year? No  Prior Activity Level Community (5-7x/wk): worked full time as a Dealer, drove PTA, active and independent PTA  Prior Functional Level Self Care: Did the patient need help bathing, dressing, using the toilet or eating? Independent  Indoor Mobility: Did the patient need assistance with walking from room to room (with or without device)? Independent  Stairs: Did the patient need assistance with internal or external stairs (with or without device)? Independent  Functional Cognition: Did the patient need help planning regular tasks such as shopping or remembering to take medications? Independent  Home Assistive Devices / Equipment Home Assistive Devices/Equipment: Eyeglasses Home Equipment: None  Prior Device Use: Indicate devices/aids used by the patient prior to current illness, exacerbation or injury? None of the above  Current Functional Level Cognition  Overall Cognitive Status: Impaired/Different from baseline Orientation Level: Oriented X4 General Comments: pt needing assist and verbal cues to switch from battery back to wall power    Extremity Assessment (includes Sensation/Coordination)  Upper Extremity Assessment: Generalized weakness  Lower Extremity Assessment: Defer to PT evaluation    ADLs  Overall ADL's : Needs assistance/impaired Eating/Feeding: Set up, Sitting Eating/Feeding Details (indicate cue type and reason): setup with lunch tray end of session Grooming: Oral care, Standing, Supervision/safety, Wash/dry hands, Wash/dry face, Applying deodorant, Sitting, Set up Grooming Details (indicate cue type and reason): oral care standing, face/hands/combing hair in chair Upper Body Bathing: Minimal assistance, Sitting Upper Body Bathing Details (indicate cue type and reason): assisted with back Lower Body Bathing: Supervison/ safety,  Sit to/from stand Upper Body Dressing : Minimal assistance, Sitting Upper Body Dressing Details (indicate cue type and reason): assisted for LVAD vest Lower Body Dressing: Set up, Sitting/lateral leans Lower Body Dressing Details (indicate cue type and reason): donning socks Toilet Transfer: Minimal assistance, Ambulation, Comfort height toilet Toilet Transfer Details (indicate cue type and reason): pushing IV pole Toileting- Clothing Manipulation and Hygiene: Supervision/safety, Sit to/from stand Functional mobility during ADLs: Supervision/safety, Rolling walker General ADL Comments: pt tolerating OOB to chair s/p extubation on 7/13 and removal of IABP. pt now with increased weakness and unsteadiness with mobility given recent medical changes     Mobility  Overal bed mobility: Modified Independent Bed Mobility: Rolling, Sidelying to Sit Rolling: Min guard Sidelying to sit: Min guard Supine to sit: Min assist, HOB elevated Sit to supine: Min guard General bed mobility comments: to return to supine    Transfers  Overall transfer level: Needs assistance Equipment used: Rolling walker (2 wheeled) Transfers: Sit to/from Stand Sit to Stand: Supervision Stand pivot transfers: Min assist General transfer comment: cues for hand placement    Ambulation / Gait / Stairs / Wheelchair Mobility  Ambulation/Gait Ambulation/Gait assistance: Min assist, Mod assist, Max assist Gait Distance (Feet): 240 Feet Assistive device: 1 person hand held assist, None Gait Pattern/deviations: Step-through pattern, Decreased stride length, Staggering left, Staggering right, Trunk flexed General Gait Details: Slow, very unsteady gait without DME, pt requiring intermittent HHA and min-maxA to prevent LOB. 4x standing rest breaks secondary to c/o fatigue and dizziness; pt declining seated rest. Gait velocity: Decreased Gait velocity interpretation: <1.31 ft/sec, indicative of household ambulator    Posture /  Balance Balance Overall balance assessment: Needs assistance Sitting-balance support: No upper extremity supported, Feet supported Sitting balance-Leahy Scale: Good Standing balance  support: During functional activity Standing balance-Leahy Scale: Fair Standing balance comment: Can maintain static standing without UE support; reliant on UE support or external assist for dynamic stability High level balance activites: Side stepping, Direction changes, Turns, Head turns High Level Balance Comments: Postive LOB with higher level balance tasks requiring min-maxA to correct    Special needs/care consideration NEW LVAD  Skin: ecchymosis to right groin, rash to left abdomen, incision to chest from 6/24, incision to mid anterior abdomen 6/24   Special service needs: pt is a new LVAD patient   Designated visitor: sister: [place] (from acute therapy documentation) Living Arrangements: Alone Available Help at Discharge: Family, Available 24 hours/day Type of Home: House Home Layout: One level Home Access: Stairs to enter Entrance Stairs-Rails: Right, Left, Can reach both Entrance Stairs-Number of Steps: 3 Bathroom Shower/Tub: Tub/shower unit, Industrial/product designer: No Home Care Services: No  Discharge Living Setting Plans for Discharge Living Setting: House Type of Home at Discharge: House Discharge Home Layout: One level Discharge Home Access: Stairs to enter Entrance Stairs-Rails: Can reach both Entrance Stairs-Number of Steps: 2 Discharge Bathroom Shower/Tub: Tub/shower unit Discharge Bathroom Toilet: Standard Discharge Bathroom Accessibility: Yes How Accessible: Accessible via walker (if sidesteps with RW) Does the patient have any problems obtaining your medications?: No  Social/Family/Support Systems Patient Roles: Other (Comment) (full time employee; lives alone, Doctor, general practice ) Contact Information: sister: [name]  [phone] Anticipated Caregiver: sister Anticipated Caregiver's Contact Information: see above Ability/Limitations of Caregiver: supervision/Min A Caregiver Availability: 24/7 Discharge Plan Discussed with Primary Caregiver: Yes (pt and his sister) Is Caregiver In Agreement with Plan?: Yes Does Caregiver/Family have Issues with Lodging/Transportation while Pt is in Rehab?: No  Goals Patient/Family Goal for Rehab: PT/OT: Mod I; SLP: NA Expected length of stay: 8-12 days Pt/Family Agrees to Admission and willing to participate: Yes Program Orientation Provided & Reviewed with Pt/Caregiver Including Roles  & Responsibilities: Yes (pt and his sister)  Barriers to Discharge: Home environment access/layout, Other (comments)  Barriers to Discharge Comments: new LVAD; steps to enter home.   Decrease burden of Care through IP rehab admission: NA  Possible need for SNF placement upon discharge: Not anticipated; pt has good social support from his sister and anticipate pt can reach a Supervision/Mod I level after CIR stay to return home safety with his sister.   Patient Condition: I have reviewed medical records from [facility], spoken with PA, and patient and family member. I met with patient at the bedside for inpatient rehabilitation assessment.  Patient will benefit from ongoing PT and OT, can actively participate in 3 hours of therapy a day 5 days of the week, and can make measurable gains during the admission.  Patient will also benefit from the coordinated team approach during an Inpatient Acute Rehabilitation admission.  The patient will receive intensive therapy as well as Rehabilitation physician, nursing, social worker, and care management interventions.  Due to safety, skin/wound care, disease management, medication administration, pain management and patient education the patient requires 24 hour a day rehabilitation nursing.  The patient is currently Min, Mod, Max A for  240 feet with HHA with mobility and Supervision to [place] A for basic ADLs.  Discharge setting and therapy post discharge at home with home health is anticipated.  Patient has agreed to participate in the Acute Inpatient Rehabilitation Program and will admit 07/23/19.  Preadmission Screen Completed By:  Raechel Ache, 07/23/2019 10:56 AM ______________________________________________________________________  Discussed status with Dr. Dagoberto Ligas on 07/23/19 at 10:55AM and received approval for admission today.  Admission Coordinator:  Raechel Ache, OT, time 10:55AM/Date 07/23/19   Assessment/Plan: Diagnosis: 1. Does the need for close, 24 hr/day Medical supervision in concert with the patient's rehab needs make it unreasonable for this patient to be served in a less intensive setting? Yes 2. Co-Morbidities requiring supervision/potential complications: LVAD, on coumadin, renal cell CA, cardiogenic shock,  3. Due to bladder management, bowel management, safety, skin/wound care, disease management, medication administration, pain management and patient education, does the patient require 24 hr/day rehab nursing? Yes 4. Does the patient require coordinated care of a physician, rehab nurse, PT, OT, and SLP to address physical and functional deficits in the context of the above medical diagnosis(es)? Yes Addressing deficits in the following areas: balance, endurance, locomotion, strength, transferring, bathing, dressing, feeding, grooming and toileting 5. Can the patient actively participate in an intensive therapy program of at least 3 hrs of therapy 5 days a week? Yes 6. The potential for patient to make measurable gains while on inpatient rehab is fair 7. Anticipated functional outcomes upon discharge from inpatient rehab: modified independent and supervision PT, modified independent and supervision OT, n/a SLP 8. Estimated rehab length of stay to reach the above functional goals is: 8-12 days 9. Anticipated  discharge destination: Home 10. Overall Rehab/Functional Prognosis: fair   MD Signature:

## 2019-07-20 NOTE — Progress Notes (Signed)
[place] for Coumadin + heparin Indication: LVAD  Allergies  Allergen Reactions  . Codeine Hives    Patient Measurements: Height: 5\' 9"  (175.3 cm) Weight: 87.9 kg (193 lb 12.6 oz) IBW/kg (Calculated) : 70.7 Heparin Dosing Weight: ~ 92 kg  Vital Signs: Temp: 97.4 F (36.3 C) (07/19 0740) Temp Source: Oral (07/19 0740) BP: 118/101 (07/19 0740) Pulse Rate: 71 (07/19 0740)  Labs: Recent Labs    07/18/19 0359 07/18/19 0359 07/19/19 4037 07/19/19 0813 07/20/19 0440 07/20/19 0450  HGB 10.4*   < >  --  11.2* 11.5*  --   HCT 33.9*  --   --  35.4* 37.0*  --   PLT 203  --   --  232 239  --   LABPROT 14.9  --  14.7  --  15.4*  --   INR 1.2  --  1.2  --  1.3*  --   HEPARINUNFRC <0.10*  --  <0.10*  --   --  <0.10*  CREATININE 1.18  --  1.22  --  1.25*  --    < > = values in this interval not displayed.    Estimated Creatinine Clearance: 73.3 mL/min (A) (by C-G formula based on SCr of 1.25 mg/dL (H)).  Assessment: 54 yo male s/p LVAD HM3 placement 6/24.  Pharmacy asked to dose anticoagulation with IV heparin and Coumadin. No overt bleeding or complications noted.    IABP was pulled and pt extubated on 7/13.   INR 1.3 today after another inc dose yesterday - previously sensitive to inc warfarin doses and have been conservative but now patient moe stable and had been eating more. He states he will not eat hospital-provided food but will eat food brought from home by family. Hgb stable 10-11, plt 200s. LDH down to 200s. Heparin level remains undetectable on fixed low-dose heparin 500 units/hr.   Goal of Therapy:  Heparin level: 0.2-0.5 - currently planned for fixed dose heparin  INR 2-2.5  Monitor platelets by anticoagulation protocol: Yes   Plan:  -Warfarin 6mg  PO x1 today -Continue heparin infusion at 500 units/hr until INR therapeutic -Daily INR, CBC, LDH, s/sx of bleeding, HL  Vertis Kelch, PharmD, BCPS Phone [phone] 07/20/2019       8:13 AM  Please check [email or web address] for unit-specific pharmacist phone numbers

## 2019-07-20 NOTE — Progress Notes (Signed)
Occupational Therapy Treatment Patient Details Name: Aaron [name] MRN: [id number] DOB: August 19, 1965 Today's Date: 07/20/2019    History of present illness Pt is 54yo admitted 6/16 with STEMI with multivessel disease. cath with no viable targets for CABG. Pt s/p HeartMate 3 LVAD implantation 6/24. VT 2x on 7/1 s/p DCCV. 7/7 pt with torsades. 7/8 pt intubated, bronchoscopy with repeat Vtach s/p cardioversion and IABP placed. IABP removed 7/13 and extubated 7/13.  PMHx: HTN, tobacco use (30pack yrs)   OT comments  This 54 yo male admitted with above presents to acute OT making progress towards ADL goals of toileting and grooming. Still has issues with endurance for activity when up on his feet and decreased following of sternal precautions (needs VCs). He will continue to benefit from acute OT with follow up on CIR to get to a Mod I level for all of his basic ADLs at a normal pace and start on IADLs.  Follow Up Recommendations  CIR;Supervision - Intermittent    Equipment Recommendations  3 in 1 bedside commode       Precautions / Restrictions Precautions Precautions: Fall;Sternal;Other (comment) Precaution Comments: LVAD       Mobility Bed Mobility Overal bed mobility: Needs Assistance Bed Mobility: Rolling;Sidelying to Sit Rolling: Min guard Sidelying to sit: Min guard Supine to sit: Min assist;HOB elevated Sit to supine: Min guard   General bed mobility comments: Verbal cues for sternal precautions  Transfers Overall transfer level: Needs assistance Equipment used: None Transfers: Sit to/from Stand Sit to Stand: Min guard         General transfer comment: verbal cues for hand placement.    Balance Overall balance assessment: Needs assistance Sitting-balance support: No upper extremity supported;Feet supported Sitting balance-Leahy Scale: Good     Standing balance support: During functional activity Standing balance-Leahy Scale: Fair Standing balance comment:  standing at sink for grooming tasks                           ADL either performed or assessed with clinical judgement   ADL Overall ADL's :  Needs assistance/impaired     Grooming: Wash/dry hands;Wash/dry face;Oral care;Min guard;Standing               Lower Body Dressing: Minimal assistance;Sit to/from stand   Toilet Transfer: Minimal assistance;Ambulation;Comfort height toilet Toilet Transfer Details (indicate cue type and reason): pushing IV pole Toileting- Clothing Manipulation and Hygiene: Min guard;Sit to/from stand               Vision Baseline Vision/History: Wears glasses Wears Glasses: Reading only            Cognition Arousal/Alertness: Awake/alert Behavior During Therapy: Flat affect Overall Cognitive Status: Within Functional Limits for tasks assessed Area of Impairment: Memory                               General Comments: decreased memory for precautions today              General Comments VSS on RA.     Pertinent Vitals/ Pain       Pain Assessment: No/denies pain         Frequency  Min 2X/week        Progress Toward Goals  OT Goals(current goals can now be found in the care plan section)  Progress towards OT goals: Progressing toward goals  Acute Rehab OT  Goals Patient Stated Goal: to get back home to his dog "[name]"  Plan Discharge plan remains appropriate       AM-PAC OT "6 Clicks" Daily Activity     Outcome Measure   Help from another person eating meals?: None Help from another person taking care of personal grooming?: A Little Help from another person toileting, which includes using toliet, bedpan, or urinal?: A Little Help from another person bathing (including washing, rinsing, drying)?: A Little Help from another person to put on and taking off regular upper body clothing?: A Little Help from another person to put on and taking off regular lower body clothing?: A Little 6 Click Score:  19    End of Session Equipment Utilized During Treatment: Gait belt  OT Visit Diagnosis: Unsteadiness on feet (R26.81);Muscle weakness (generalized) (M62.81)   Activity Tolerance Patient tolerated treatment well   Patient Left in chair;with call bell/phone within reach   Nurse Communication Mobility status        Time: 1657-9038 OT Time Calculation (min): 28 min  Charges: OT General Charges $OT Visit: 1 Visit OT Treatments $Self Care/Home Management : 23-37 mins  Golden Circle, OTR/L Acute [employer] Pager [phone] Office [phone]      Almon Register 07/20/2019, 1:19 PM

## 2019-07-20 NOTE — Progress Notes (Addendum)
Patient ID: Aaron [name], male   DOB: 19-Aug-1965, 54 y.o.   MRN: [id number]     Advanced Heart Failure Rounding Note  PCP-Cardiologist: No primary care provider on file.   Subjective:    - HM3 placement 6/24  - Had 2 episodes of VF requiring emergent DC-CV on 07/02/19 - Recurrent VF on 7/2 and broke spontaneously.  - 7/3 Recurrent VF at 2a.-> emergent DC-CV.  - 07/07/2019 VT/Torsades -> repeat DC_CV - 07/08/2019 VF/Torsades x2--> DC-CV x2.  - 07/09/19 developed sepsis/PNA -> intubated - 07/09/19 Multiple episodes of VT/VF -> IABP placed. AV pacing via EPW - 07/14/19 IABP removed and extubated. Pacing discontinued.   Early AM Co-ox 47%. Repeat pending.   Remains on heparin bridge. INR 1.3.  LDH 280  Maintaining NSR. No VT.   Laying in bed. Feels ok. No complaints.   LVAD Interrogation HM III: Speed: 5200 Flow: 4.3 PI: 4.2 Power: 3.7.  1 PI event in 24 hrs.     Objective:   Weight Range: 87.9 kg Body mass index is 28.62 kg/m.   Vital Signs:   Temp:  [97.5 F (36.4 C)-98.4 F (36.9 C)] 97.9 F (36.6 C) (07/19 0440) Pulse Rate:  [64] 64 (07/19 0000) Resp:  [13-16] 13 (07/19 0440) BP: (101-104)/(74-91) 101/85 (07/19 0440) SpO2:  [94 %-97 %] 97 % (07/19 0440) Weight:  [87.9 kg] 87.9 kg (07/19 0440) Last BM Date: 07/19/19  Weight change: Filed Weights   07/18/19 0239 07/19/19 0231 07/20/19 0440  Weight: 91 kg 87.5 kg 87.9 kg    Intake/Output:   Intake/Output Summary (Last 24 hours) at 07/20/2019 0751 Last data filed at 07/19/2019 1600 Gross per 24 hour  Intake 552.49 ml  Output 475 ml  Net 77.49 ml      Physical Exam   General: Well appearing WM, laying in bed. No distress.  HEENT: Normal. Neck: Supple, no JVD. Carotids OK.  Cardiac:  + Mechanical heart sounds with LVAD hum present.  Lungs:  Clear. No wheezing  Abdomen:  nontender, Non distended, no HSM. No bruits or masses. +BS  LVAD exit site: Well-healed and incorporated. Dressing dry and intact. No erythema  or drainage. Stabilization device present and accurately applied. Driveline dressing changed daily per sterile technique. Extremities:  Warm and dry. No cyanosis, clubbing, rash, or edema.  Neuro:  Alert & oriented x 3. Cranial nerves grossly intact. Moves all 4 extremities w/o difficulty. Affect pleasant     Telemetry   NSR 60s-70s   Labs    CBC Recent Labs    07/19/19 0813 07/20/19 0440  WBC 6.9 7.2  HGB 11.2* 11.5*  HCT 35.4* 37.0*  MCV 94.4 95.4  PLT 232 382   Basic Metabolic Panel Recent Labs    07/19/19 0632 07/20/19 0440  NA 136 136  K 4.2 4.1  CL 96* 96*  CO2 29 30  GLUCOSE 107* 112*  BUN 15 15  CREATININE 1.22 1.25*  CALCIUM 9.0 9.1  MG 1.9 1.9   Liver Function Tests No results for input(s): AST, ALT, ALKPHOS, BILITOT, PROT, ALBUMIN in the last 72 hours. No results for input(s): LIPASE, AMYLASE in the last 72 hours. Cardiac Enzymes No results for input(s): CKTOTAL, CKMB, CKMBINDEX, TROPONINI in the last 72 hours.  BNP: BNP (last 3 results) Recent Labs    07/02/19 0531 07/09/19 0313 07/16/19 0346  BNP 1,646.4* 1,815.2* 3,431.7*    ProBNP (last 3 results) No results for input(s): PROBNP in the last 8760 hours.  D-Dimer No results for input(s): DDIMER in the last 72 hours. Hemoglobin A1C No results for input(s): HGBA1C in the last 72 hours. Fasting Lipid Panel No results for input(s): CHOL, HDL, LDLCALC, TRIG, CHOLHDL, LDLDIRECT in the last 72 hours. Thyroid Function Tests No results for input(s): TSH, T4TOTAL, T3FREE, THYROIDAB in the last 72 hours.  Invalid input(s): FREET3  Other results:   Imaging    No results found.   Medications:     Scheduled Medications:  amiodarone  400 mg Oral BID   aspirin  81 mg Oral Daily   atorvastatin  80 mg Oral Daily   bisacodyl  10 mg Oral Daily   Or   bisacodyl  10 mg Rectal Daily   chlorhexidine  15 mL Mouth Rinse BID   Chlorhexidine Gluconate Cloth  6 each Topical Daily    clonazePAM  0.5 mg Oral BID   colchicine  0.6 mg Oral BID   digoxin  0.125 mg Oral Daily   docusate sodium  100 mg Oral Daily   folic acid  1 mg Oral Daily   gabapentin  300 mg Oral TID   insulin aspart  0-5 Units Subcutaneous QHS   insulin aspart  0-9 Units Subcutaneous TID WC   mouth rinse  15 mL Mouth Rinse q12n4p   melatonin  5 mg Oral QPC supper   multivitamin with minerals  1 tablet Oral Daily   pantoprazole  40 mg Oral Daily   polyethylene glycol  17 g Oral Daily   potassium chloride  40 mEq Oral BID   ranolazine  500 mg Oral BID   sodium chloride flush  10-40 mL Intracatheter Q12H   sodium chloride flush  3 mL Intravenous Q12H   sodium chloride flush  3 mL Intravenous Q12H   thiamine  100 mg Oral Daily   torsemide  20 mg Oral Daily   Warfarin - Pharmacist Dosing Inpatient   Does not apply q1600    Infusions:  sodium chloride     sodium chloride Stopped (07/15/19 0129)   heparin 500 Units/hr (07/19/19 1600)   lactated ringers      PRN Medications: sodium chloride, acetaminophen, ALPRAZolam, bisacodyl, calcium carbonate, dextrose, fentaNYL (SUBLIMAZE) injection, ondansetron (ZOFRAN) IV, oxyCODONE, pneumococcal 23 valent vaccine, polyvinyl alcohol, sodium chloride flush, sodium chloride flush, traMADol, traZODone    Patient Profile   Aaron [name] --post MI cardiogenic shock/ optimization prior to anticipated CABG, at the request of Dr. Marlou Porch, Cardiology.    Assessment/Plan   1. Refractory VT/VF  - developed VF on 7/1 x 2 requiring emergent DC-CV - On 7/2 had self-terminating episode - On 7/3 persistent VF/torsades -> DC-CV - On 7/6  VF/torsades.  - On 7/7 VF /Torsades on 2 separate occasions-> DC-CV x2.  - On 7/8 multiple episodes on VT/VF (incessant) -> unclear which intervention broke it   - Now off mexiletine and on amiodarone 400 mg bid.  - Keep K> 4.0 Mg >2.0 (4.1 and 1.9 today) - VAD speed initially reduced to 5000 but output  low. Now at 5200.  - Keep off inotropes with VT.  - No ventricular arrhythmias on telemetry.   2. Acute Anterolateral STEMI/ Multivessel CAD - Emergent cath showed severe multivessel disease w/ 75% mid- distal LM, 100% prox LAD, not amendable to PCI (unable to cross w/ guide wire, probable CTO), 100% prox LCx (probable CTO) and 100% ostial RCA (probable CTO). There were mild to moderate collaterals right to left to the LAD, minimal right  to right collaterals an minimal left to left collaterals. LVEF 25% - Hs trop peaked 27,000. - No targets for CABG / PCI.   - S/P HMIII VAD - suspect VF was ischemically-mediated  3.  Acute Systolic Heart Failure --->Cardiogenic Shock - 2/2 acute MI. LVEF by LVG 25% - IABP out 6/18 but replaced 6/23. Removed in OR on 6/24  - HMIII LVAD  Placed 6/24 - Extubated 6/25  - Was on milrinone for RV failure. Now off due to VF.  - IABP removed 7/13 - CO-OX low today at 47% (early AM draw), will repeat   - Euvolemic on exam. Continue torsemide 20 mg daily.   - Midodrine discontinued, MAPs 90s - Continue digoxin for RV support.   - LDH  Peaked  2600 but now down to 280. No evidence pump thrombosis  - Ramp echo 7/8 RV only mildly reduced. Good cannula position. Increase VAD speed as tolerated, currently 5200.  Would repeat ramp echo prior to discharge (early this week) now that ventricular arrhythmias have calmed, may be able to increase speed further.  - INR 1.3. Continue coumadin + heparin bridge. Discussed dosing w/ pharmacy   4. Acute hypoxic respiratory failure - intubated 7/8. Extubated 7/13 - Chest CT with bilateral PNA R>L - CCM following - completed abx therapy w/ vanc/cefipime - PCT 1.97 => 1.3 => 0.6.  - BCx and BAL NGTD - Currently AF w/ normal WBC ct   5. Shock liver - Resolved.  6. Tobacco Abuse - 30 pack years  - smoking cessation advised  - Will need to quit if to be considered for heart transplant in the future   7. Renal mass = RCC -  MRI abdomen- 1.8 cm right renal lesion, most consistent with renal cell carcinoma. No evidence of renal vein involvement or nodal metastasis. - Discussed w/ Urology and R RCC is small and will just require surveillance.  Ok to proceed with VAD from their standpoint.   8. Hyponatremia  - Resolved.   9. Sleep Apnea - frequent apnea at night  - needs formal sleep study   - CPAP ordered while inpatient  10. Anemia - Post-op, increasing hgb 11.5.   11. Debility - continue to mobilize w/ PT, walking more.  - CIR recommended. Consult placed - stable for d/c to CIR from cardiac standpoint. Discussed w/ CIR today. Awaiting approval/bed.    Lyda Jester, PA-C  07/20/2019 7:51 AM  Patient seen and examined with the above-signed Advanced Practice Provider and/or Housestaff. I personally reviewed laboratory data, imaging studies and relevant notes. I independently examined the patient and formulated the important aspects of the plan. I have edited the note to reflect any of my changes or salient points. I have personally discussed the plan with the patient and/or family.   Feels ok. Able to ambulate halls. No further VT. MAPs and VAD parameters stable with occasional PI events. On heparin/warfarin. INR 1.3 Appetite ok but not liking food. No longer pacing  General:  NAD.  HEENT: normal  Neck: supple. JVP not elevated.  Carotids 2+ bilat; no bruits. No lymphadenopathy or thryomegaly appreciated. Cor: LVAD hum.  Lungs: Clear. Abdomen: obese soft, nontender, non-distended. No hepatosplenomegaly. No bruits or masses. Good bowel sounds. Driveline site clean. Anchor in place.  Extremities: no cyanosis, clubbing, rash. Warm no edema  Neuro: alert & oriented x 3. No focal deficits. Moves all 4 without problem   Ok for d/c to CIR when bed available. Continue heparin -> coumadin.  Discussed dosing with PharmD personally. Recheck co-ox.   Glori Bickers, MD  8:55 AM

## 2019-07-20 NOTE — Progress Notes (Signed)
[id number] Pt stated he has worked with PT and OT and does not feel up to walking right now. Stated he would try later. Encouraged him to walk with staff later. Encouraged IS and sternal precautions. Pt stated he has quit smoking and using alcohol. Discussed CRP 2 as future goal and pt is ok with referral to [place] to meet requirement for STEMI protocol.  Pt hoping to go to CIR tomorrow. Graylon Good RN BSN 07/20/2019 2:58 PM

## 2019-07-20 NOTE — Progress Notes (Signed)
Physical Therapy Treatment Patient Details Name: Aaron [name] MRN: [id number] DOB: October 01, 1965 Today's Date: 07/20/2019    History of Present Illness Pt is 54yo admitted 6/16 with STEMI with multivessel disease. cath with no viable targets for CABG. Pt s/p HeartMate 3 LVAD implantation 6/24. VT 2x on 7/1 s/p DCCV. 7/7 pt with torsades. 7/8 pt intubated, bronchoscopy with repeat Vtach s/p cardioversion and IABP placed. IABP removed 7/13 and extubated 7/13.  PMHx: HTN, tobacco use (30pack yrs)    PT Comments    Pt making steady progress but continues to have decreased functional activity tolerance. Continue to recommend CIR for further rehab to get pt to a level where he can tolerate his daily activities and be more independent. Pt remains motivated to work toward independence.   Follow Up Recommendations  CIR     Equipment Recommendations  Other (comment) (rollator)    Recommendations for Other Services       Precautions / Restrictions Precautions Precautions: Fall;Sternal;Other (comment) Precaution Comments: LVAD    Mobility  Bed Mobility Overal bed mobility: Needs Assistance Bed Mobility: Supine to Sit;Sit to Supine     Supine to sit: Min assist;HOB elevated Sit to supine: Min guard   General bed mobility comments: Assist to elevate trunk into sitting. Verbal cues for sternal precautions  Transfers Overall transfer level: Needs assistance Equipment used: 4-wheeled walker Transfers: Sit to/from Stand Sit to Stand: Min assist         General transfer comment: Assist for balance and verbal cues for hand placement.  Ambulation/Gait Ambulation/Gait assistance: Min assist Gait Distance (Feet): 190 Feet Assistive device: 4-wheeled walker Gait Pattern/deviations: Step-through pattern;Decreased stride length;Staggering left Gait velocity: decr Gait velocity interpretation: 1.31 - 2.62 ft/sec, indicative of limited community ambulator General Gait Details: Assist for  balance. Pt with loss of balance on turning.    Stairs             Wheelchair Mobility    Modified Rankin (Stroke Patients Only)       Balance Overall balance assessment: Needs assistance Sitting-balance support: No upper extremity supported;Feet supported Sitting balance-Leahy Scale: Fair     Standing balance support: No upper extremity supported Standing balance-Leahy Scale: Fair                              Cognition Arousal/Alertness: Awake/alert Behavior During Therapy: Flat affect Overall Cognitive Status: Within Functional Limits for tasks assessed                                        Exercises      General Comments General comments (skin integrity, edema, etc.): VSS on RA. Pt performed power module to battery to power module with supervision.      Pertinent Vitals/Pain Pain Assessment: No/denies pain    Home Living                      Prior Function            PT Goals (current goals can now be found in the care plan section) Acute Rehab PT Goals Patient Stated Goal: to get back home to his dog "[name]" Progress towards PT goals: Progressing toward goals    Frequency    Min 3X/week      PT Plan Current plan remains appropriate  Co-evaluation              AM-PAC PT "6 Clicks" Mobility   Outcome Measure  Help needed turning from your back to your side while in a flat bed without using bedrails?: A Little Help needed moving from lying on your back to sitting on the side of a flat bed without using bedrails?: A Little Help needed moving to and from a bed to a chair (including a wheelchair)?: A Little Help needed standing up from a chair using your arms (e.g., wheelchair or bedside chair)?: A Little Help needed to walk in hospital room?: A Little Help needed climbing 3-5 steps with a railing? : Total 6 Click Score: 16    End of Session   Activity Tolerance: Patient tolerated treatment  well Patient left: in bed;with call bell/phone within reach Nurse Communication: Mobility status PT Visit Diagnosis: Other abnormalities of gait and mobility (R26.89);Muscle weakness (generalized) (M62.81)     Time: 8280-0349 PT Time Calculation (min) (ACUTE ONLY): 22 min  Charges:  $Gait Training: 8-22 mins                     [place] Pager [phone] Office [place] 07/20/2019, 11:10 AM

## 2019-07-20 NOTE — Progress Notes (Signed)
Inpatient Rehabilitation-Admissions Coordinator   Received notification from heart failure team that patient may be medically ready for CIR by tomorrow after family VAD education. Spoke with pt bedside who still has interest in this program. AC will begin insurance authorization process for possible admit.   Will update once there has been a determination.

## 2019-07-20 NOTE — Progress Notes (Signed)
Patient refuses CPAP 

## 2019-07-21 LAB — COOXEMETRY PANEL
Carboxyhemoglobin: 1.6 % — ABNORMAL HIGH (ref 0.5–1.5)
Methemoglobin: 0.8 % (ref 0.0–1.5)
O2 Saturation: 61.2 %
Total hemoglobin: 12.2 g/dL (ref 12.0–16.0)

## 2019-07-21 LAB — GLUCOSE, CAPILLARY
Glucose-Capillary: 106 mg/dL — ABNORMAL HIGH (ref 70–99)
Glucose-Capillary: 108 mg/dL — ABNORMAL HIGH (ref 70–99)
Glucose-Capillary: 102 mg/dL — ABNORMAL HIGH (ref 70–99)
Glucose-Capillary: 99 mg/dL (ref 70–99)
Glucose-Capillary: 106 mg/dL — ABNORMAL HIGH (ref 70–99)

## 2019-07-21 LAB — CBC
HCT: 37.9 % — ABNORMAL LOW (ref 39.0–52.0)
Hemoglobin: 11.7 g/dL — ABNORMAL LOW (ref 13.0–17.0)
MCH: 29 pg (ref 26.0–34.0)
MCHC: 30.9 g/dL (ref 30.0–36.0)
MCV: 94 fL (ref 80.0–100.0)
Platelets: 256 10*3/uL (ref 150–400)
RBC: 4.03 MIL/uL — ABNORMAL LOW (ref 4.22–5.81)
RDW: 16.1 % — ABNORMAL HIGH (ref 11.5–15.5)
WBC: 7.2 10*3/uL (ref 4.0–10.5)
nRBC: 0 % (ref 0.0–0.2)

## 2019-07-21 LAB — BASIC METABOLIC PANEL
Anion gap: 10 (ref 5–15)
BUN: 17 mg/dL (ref 6–20)
CO2: 30 mmol/L (ref 22–32)
Calcium: 8.9 mg/dL (ref 8.9–10.3)
Chloride: 94 mmol/L — ABNORMAL LOW (ref 98–111)
Creatinine, Ser: 1.34 mg/dL — ABNORMAL HIGH (ref 0.61–1.24)
GFR calc Af Amer: >60 mL/min (ref >60)
GFR calc non Af Amer: 59 mL/min — ABNORMAL LOW (ref >60)
Glucose, Bld: 100 mg/dL — ABNORMAL HIGH (ref 70–99)
Potassium: 4.4 mmol/L (ref 3.5–5.1)
Sodium: 134 mmol/L — ABNORMAL LOW (ref 135–145)

## 2019-07-21 LAB — LACTATE DEHYDROGENASE: LDH: 244 U/L — ABNORMAL HIGH (ref 98–192)

## 2019-07-21 LAB — HEPARIN LEVEL (UNFRACTIONATED): Heparin Unfractionated: <0.1 IU/mL — ABNORMAL LOW (ref 0.30–0.70)

## 2019-07-21 LAB — PROTIME-INR
INR: 1.4 — ABNORMAL HIGH (ref 0.8–1.2)
Prothrombin Time: 16.9 seconds — ABNORMAL HIGH (ref 11.4–15.2)

## 2019-07-21 LAB — MAGNESIUM: Magnesium: 2 mg/dL (ref 1.7–2.4)

## 2019-07-21 MED ORDER — POTASSIUM CHLORIDE CRYS ER 20 MEQ PO TBCR
20.0000 meq | EXTENDED_RELEASE_TABLET | Freq: Every day | ORAL | Status: DC
  Filled 2019-07-21: qty 1

## 2019-07-21 MED ORDER — WARFARIN SODIUM 7.5 MG PO TABS
7.5000 mg | ORAL_TABLET | Freq: Once | ORAL | Status: AC
  Administered 2019-07-21: 7.5 mg via ORAL
  Filled 2019-07-21: qty 1

## 2019-07-21 MED ORDER — HEPARIN (PORCINE) 25000 UT/250ML-% IV SOLN
500.0000 [IU]/h | INTRAVENOUS | Status: DC
  Administered 2019-07-22: 500 [IU]/h via INTRAVENOUS

## 2019-07-21 NOTE — Progress Notes (Signed)
Inpatient Rehabilitation-Admissions Coordinator   I have received insurance approval for admit to CIR, however I do not have an open bed for this patient today. Will follow up tomorrow for possible admit, pending bed availability.   RN and MD notified. Please call if questions.   Raechel Ache, OTR/L  Rehab Admissions Coordinator  [phone] 07/21/2019 11:04 AM

## 2019-07-21 NOTE — Discharge Summary (Addendum)
Advanced Heart Failure Team  Discharge Summary   Patient ID: Aaron [name] MRN: [id number], DOB/AGE: Jan 26, 1965 54 y.o. 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 Emergent cath showed severe multivessel disease w/ 75% mid- distal LM, 100% prox LAD, not amendable to PCI (unable to cross w/ guide wire, probable CTO), 100% prox LCx (probable CTO) and 100% ostial RCA (probable CTO). There were mild to moderate collaterals right to left to the LAD, minimal right to right collaterals an minimal left to left collaterals. RHC demonstrated severe pulmonary HTN and elevated LVEDP. LVG demonstrated severely reduced LVEF, 25%, and mild-mod AI. IABP + Gordy Councilman cath placed and pt transferred to [facility] for further management + surgical consultation for CABG.   Upon arrival to [facility], he was started on milrinone and continued w/ IABP. CT surgery evaluated for potential CABG, however CAD not amenable to surgical or percutaneous revascularization due to poor target vessels and poor run-off. Due to severely depressed LV function, inoperable CAD w/ little coronary perfusion reserve, it was recommended he go LVAD implantation (not a candidate for heart transplant w/ active tobacco use). Also of note, he was noted to  have a 1.8 cm rt renal lesion on MRI of abdomen. Urology felt most c/w RCC, however given small size, urology recommended surveillance and cleared for cardiac surgery.   He underwent HM3 LVAD placement on 6/24.   Post operative course c/b recurrent VT/ VF/ Torsades requiring several emergent DCCVs.   -Had 2 episodes of VF requiring emergent DC-CV on 07/02/19 - Recurrent VF on 7/2 and broke spontaneously.  - 7/3 Recurrent VF at 2a.-> emergent DC-CV.  - 07/07/2019 VT/Torsades -> repeat DC_CV - 07/08/2019 VF/Torsades x2--> DC-CV x2.   On 7/8 developed sepsis/PNA requiring intubation. Placed on abx.   Also multiple episodes of recurrent VT/VF on 7/8-> IABP placed. AV pacing via EPW. Inotrope's gradually weaned off. EP followed and he was treated w/ amiodarone and Mexiletine. Rhythm stabilized. No further VT/VF. Amiodarone continued but, per EP, Mexiletine later discontinued.   07/14/19 IABP removed and extubated.  On 7/21, PO diuretics were discontinued. Felt to be dry and dizzy w/ standing. Was given IVFs for hydration. Had issues w/ nausea and marginal Co-ox, felt due to RV failure but VAD support good. Sildenafil added for RV support.  He continued to make progress and worked w/ PT. CIR recommended for continued rehabilitation.   On 7/23, he was seen and examined by Dr. Haroldine Laws, who felt he was stable for d/c to CIR. Vital signs and VAD parameters stable. Pt and family received appropriate VAD education prior to d/c.    See Detailed Hospital Problem List Below   1. Refractory VT/VF  - developed VF on 7/1 x 2 requiring emergent DC-CV - On 7/2 had self-terminating episode - On 7/3 persistent VF/torsades -> DC-CV - On 7/6  VF/torsades.  - On 7/7 VF /  Torsades on 2 separate occasions-> DC-CV x2.  - On 7/8 multiple episodes on VT/VF (incessant) -> unclear which intervention broke it   - Now off mexiletine and on amiodarone 400 mg bid.  - Keep K> 4.0 Mg >2.0 (4.0 and 2.0 today).  - VAD speed  initially reduced to 5000 but output low. Now at 5200.  - Keep off inotropes with VT.  - No ventricular arrhythmias on telemetry - I have d/w EP regarding role of ICD for him. They will discuss.   2. Acute Anterolateral STEMI/ Multivessel CAD - Emergent cath showed severe multivessel disease w/ 75% mid- distal LM, 100% prox LAD, not amendable to PCI (unable to cross w/ guide wire, probable CTO), 100% prox LCx (probable CTO) and 100% ostial RCA (probable CTO). There were mildto moderate collaterals right to left to the LAD, minimal right to right collateralsan minimalleft to left collaterals.LVEF 25% - Hs trop peaked 27,000. - No targets for CABG / PCI.   - S/P HMIII VAD - suspect VF was ischemically-mediated - No s/s ischemia currently  3.  Acute Systolic Heart Failure --->Cardiogenic Shock - 2/2 acute MI. LVEF by LVG 25% - IABP out 6/18 but replaced 6/23. Removed in OR on 6/24  - HMIII LVAD  Placed 6/24 - Extubated 6/25  - Was on milrinone for RV failure. Now off due to VF.  - IABP removed 7/13 - CO-OX marginal at 50% but c/w his baseline, start Sildenafil 20 mg tid for RV support   - off diuretics. CVP 8 today  - Midodrine discontinued, MAPs 80-90s - Continue digoxin for RV support.   - LDH  Peaked  2600 but now down to 194. No evidence pump thrombosis  - Ramp echo 7/8 RV only mildly reduced. Good cannula position. Increase VAD speed as tolerated, currently 5200.  Would repeat ramp echo prior to discharge (early this week) now that ventricular arrhythmias have calmed, may be able to increase  speed further.  - INR 3.2. Goal 2-2.5. Hold Coumadin tonight. Discussed dosing with PharmD personally. - has mild superficial driveline irritation. Continue to follow.   4. Acute hypoxic respiratory failure - intubated 7/8. Extubated 7/13 - Chest CT with bilateral PNA R>L - completed abx therapy w/ vanc/cefipime - PCT 1.97 => 1.3 => 0.6.  - BCx and BAL NGTD - Currently AF w/ normal  WBC ct  - No change  5. Shock liver - Resolved.  6. Tobacco Abuse - 30 pack years  - smoking cessation advised - Will need to quit if to be considered for heart transplant in the future   7. Renal mass = RCC - MRI abdomen- 1.8 cm right renal lesion, most consistent with renal cell carcinoma. No evidence of renal vein involvement or nodal metastasis. - Discussed w/ Urology and R RCC is small and will just require surveillance.  Ok to proceed with VAD from their standpoint.   8. Hyponatremia  - 132 today  9. Sleep Apnea - frequent apnea at night  - needs formal sleep study   - CPAP ordered while inpatient  10. Anemia - Post-op anemia - hgb has improved and now stabilized ~12  11. N/V - improved w/ antiemetics - no recurrence today   12. Debility - continue to mobilize w/ PT, walking more.  - Transfer to CIR today   INR Goal: 2.0-2.5  Discharge Weight Range: 183 lb  Discharge Vitals: Blood pressure 113/86, pulse 100, temperature 98.5 F (36.9 C), temperature source Oral, resp. rate  16, height _0  (1.753 m), weight 83.6 kg, SpO2 99 %.  Labs: Lab Results  Component Value Date   WBC 8.9 07/23/2019   HGB 12.2 (L) 07/23/2019   HCT 39.3 07/23/2019   MCV 93.6 07/23/2019   PLT 285 07/23/2019    Recent Labs  Lab 07/23/19 0243  NA 134*  K 4.2  CL 97*  CO2 27  BUN 15  CREATININE 1.18  CALCIUM 9.1  GLUCOSE 105*   Lab Results  Component Value Date   CHOL 196 06/19/2019   HDL 59 06/19/2019   LDLCALC 123 (H) 06/19/2019   TRIG 72 06/19/2019   BNP (last 3 results) Recent Labs    07/09/19 0313 07/16/19 0346 07/23/19 0243  BNP 1,815.2* 3,431.7* 1,811.9*    ProBNP (last 3 results) No results for input(s): PROBNP in the last 8760 hours.   Diagnostic Studies/Procedures   Emergent R/LHC 06/18/19  Mid LM lesion is 75% stenosed.  Ost LAD lesion is 100% stenosed.  Ost Cx to Prox Cx lesion is 100% stenosed.  Ost RCA lesion is 100%  stenosed.  Hemodynamic findings consistent with severe pulmonary hypertension.  Intra-aortic balloon pump was placed to help with hemodynamic stability  Intervention was deferred unable to cross with a wire ostial LAD  Intervention was otherwise deferred in favor of bypass surgery  Right Heart Pressures Hemodynamic findings consistent with severe pulmonary hypertension. Elevated LV EDP consistent with volume overload. Ao 114/97 mean of 102 LV 034/91 and diastolic of 55 Wedge mean of 43 A-wave 43 V wave 50 PA systolic 65 diastolic of 45 mean of 53 RV 79/15-AVW RV end diastolic of 20 AO sat was 98 PA sat was 66   LVG  All segments of the heart are hypokinetic.  Globally depressed left ventricular function severely ejection fraction 25% Mild to moderate AI     2D Echo 06/18/19 1. Findings consistent with multi vessel CAD. Septal and apical akinesis  mid and apical inferior and anterior wall hypokinesis . Left ventricular  ejection fraction, by estimation, is 20 to 25%. The left ventricle has  severely decreased function. The  left ventricle demonstrates regional wall motion abnormalities (see  scoring diagram/findings for description). The left ventricular internal  cavity size was severely dilated. There is moderate left ventricular  hypertrophy. Left ventricular diastolic  parameters are indeterminate.  2. Right ventricular systolic function is normal. The right ventricular  size is normal.  3. Left atrial size was severely dilated.  4. The mitral valve is normal in structure. Mild mitral valve  regurgitation. No evidence of mitral stenosis.  5. The aortic valve is tricuspid. Aortic valve regurgitation is not  visualized. No aortic stenosis is present.  6. Dilated sinus and root Root measures 4.2 cm . Aortic dilatation noted.  There is mild dilatation at the level of the sinuses of Valsalva measuring  41 mm.  7. The inferior vena cava is normal in size with greater  than 50%  respiratory variability, suggesting right atrial pressure of 3 mmHg.    Discharge Medications   Allergies as of 07/23/2019      Reactions   Codeine Hives      Medication List    STOP taking these medications   heparin 25000-0.45 UT/250ML-% infusion     TAKE these medications   amiodarone 400 MG tablet Commonly known as: PACERONE Take 1 tablet (400 mg total) by mouth 2 (two) times daily.   aspirin 81 MG chewable tablet Chew 1 tablet (81  mg total) by mouth daily.   atorvastatin 80 MG tablet Commonly known as: LIPITOR Take 1 tablet (80 mg total) by mouth daily.   digoxin 0.125 MG tablet Commonly known as: LANOXIN Take 1 tablet (0.125 mg total) by mouth daily.   folic acid 1 MG tablet Commonly known as: FOLVITE Take 1 tablet (1 mg total) by mouth daily.   gabapentin 600 MG tablet Commonly known as: NEURONTIN Take 0.5 tablets (300 mg total) by mouth 3 (three) times daily.   hydrocortisone cream 1 % Apply topically 3 (three) times daily.   magnesium oxide 400 (241.3 Mg) MG tablet Commonly known as: MAG-OX Take 1 tablet (400 mg total) by mouth daily.   pantoprazole 40 MG tablet Commonly known as: PROTONIX Take 1 tablet (40 mg total) by mouth daily.   ranolazine 500 MG 12 hr tablet Commonly known as: RANEXA Take 1 tablet (500 mg total) by mouth 2 (two) times daily.   thiamine 100 MG tablet Take 1 tablet (100 mg total) by mouth daily.   warfarin 1 MG tablet Commonly known as: COUMADIN Take 1 tablet (1 mg total) by mouth one time only at 4 PM.       Disposition   The patient will be discharged in stable condition to home. Discharge Instructions    Amb Referral to Cardiac Rehabilitation   Complete by: As directed    Diagnosis:  STEMI Other Heart Failure (see criteria below if ordering Phase II)     Heart Failure Type: Chronic Systolic Comment - LVAD   After initial evaluation and assessments completed: Virtual Based Care may be provided alone  or in conjunction with Phase 2 Cardiac Rehab based on patient barriers.: Yes         Duration of Discharge Encounter: Greater than 35 minutes   Signed, Brittainy Simmons PA-C 07/23/2019, 12:02 PM   Agree with above. Ok to d/c to CIR today.   Glori Bickers, MD  5:43 PM

## 2019-07-21 NOTE — Progress Notes (Signed)
At 1330 RN attempted to remove pacing wires with charge nurse Arbie Cookey, able to remove right sided wires but unable to remove left sided wires. Myrion Roddenberry PA paged and came to bedside to remove other set of wires at 1345. Q15 Vitals started and patient educated. RN will continue to monitor.

## 2019-07-21 NOTE — Progress Notes (Signed)
Nutrition Follow-up  RD working remotely.  DOCUMENTATION CODES:   Not applicable  INTERVENTION:   - MVI with minerals daily  - Ensure Enlive po BID, each supplement provides 350 kcal and 20 grams of protein  - Magic cup TID with meals, each supplement provides 290 kcal and 9 grams of protein  - Double protein portions TID with meals  NUTRITION DIAGNOSIS:   Inadequate oral intake related to poor appetite as evidenced by meal completion < 50%.  Ongoing  GOAL:   Patient will meet greater than or equal to 90% of their needs  Progressing  MONITOR:   PO intake, Supplement acceptance, Weight trends, Labs, I & O's  REASON FOR ASSESSMENT:   Consult LVAD Eval  ASSESSMENT:   Patient with PMH significant for HTN. Presents this admission with STEMI and acute systolic heart failure.  6/24 - HM3 LVAD placed 7/08 - IABP/Swan placed, intubated 7/13 - IABP discontinued 7/13 - extubated  Noted plan for pt to d/c to CIR. Per HF Team, pt like volume depleted. Torsemide stopped.  RD was unable to reach pt via phone call to room. Per notes, pt reporting an improved appetite. Noted that Ensure Enlive supplements that RD ordered on previous visit were d/c. Will reorder as pt's PO intake remains variable. Will also order Magic Cups with meals and double protein portions.  Current weight: 83.6 kg Admit weight: 101.6 kg  Pt's weight continues to trend down. Suspect weight loss related in part to fluid status but also to dry weight loss given variable PO intake.  Meal Completion: 0-50% x last 8 documented meals (several meals missing)  Medications reviewed and include: dulcolax, colace, folic acid, SSI, MVI with minerals, IV magnesium sulfate 2 grams once, magnesium oxide, protonix, miralax, thiamine, warfarin  Labs reviewed: sodium 134, creatinine 1.26 CBG's: 99-137 x 24 hours  Diet Order:   Diet Order            Diet Heart Room service appropriate? Yes; Fluid consistency: Thin   Diet effective now                 EDUCATION NEEDS:   Not appropriate for education at this time  Skin:  Skin Assessment: Skin Integrity Issues: Incisions: chest, abdomen  Last BM:  07/21/19  Height:   Ht Readings from Last 1 Encounters:  06/18/19 5\' 9"  (1.753 m)    Weight:   Wt Readings from Last 1 Encounters:  07/22/19 83.6 kg    BMI:  Body mass index is 27.22 kg/m.  Estimated Nutritional Needs:   Kcal:  2300-2500 kcal  Protein:  115-130 grams  Fluid:  >/= 2.3 L/day    Gaynell Face, MS, RD, LDN Inpatient Clinical Dietitian Please see AMiON for contact information.

## 2019-07-21 NOTE — Progress Notes (Signed)
Patient ID: Aaron [name], male   DOB: Jun 02, 1965, 54 y.o.   MRN: [id number]     Advanced Heart Failure Rounding Note  PCP-Cardiologist: No primary care provider on file.   Subjective:    - HM3 placement 6/24  - Had 2 episodes of VF requiring emergent DC-CV on 07/02/19 - Recurrent VF on 7/2 and broke spontaneously.  - 7/3 Recurrent VF at 2a.-> emergent DC-CV.  - 07/07/2019 VT/Torsades -> repeat DC_CV - 07/08/2019 VF/Torsades x2--> DC-CV x2.  - 07/09/19 developed sepsis/PNA -> intubated - 07/09/19 Multiple episodes of VT/VF -> IABP placed. AV pacing via EPW - 07/14/19 IABP removed and extubated. Pacing discontinued.   Feels ok. Able to ambulate halls. Appetite improving. Rhythm stabilized. No further VT.   On heparin. INR 1.4. no bleeding  LVAD Interrogation HM III: Speed: 5200 Flow: 4.4 PI: 3.7 Power: 3.7.VAD interrogated personally. Parameters stable.   Objective:   Weight Range: 85 kg Body mass index is 27.67 kg/m.   Vital Signs:   Temp:  [97.6 F (36.4 C)-98.6 F (37 C)] 98.5 F (36.9 C) (07/20 0400) Pulse Rate:  [66-99] 97 (07/19 2332) Resp:  [14-16] 16 (07/19 2332) BP: (85-126)/(56-96) 93/56 (07/20 0400) SpO2:  [94 %-98 %] 98 % (07/19 2332) Weight:  [85 kg] 85 kg (07/20 0400) Last BM Date: 07/21/19  Weight change: Filed Weights   07/19/19 0231 07/20/19 0440 07/21/19 0400  Weight: 87.5 kg 87.9 kg 85 kg    Intake/Output:   Intake/Output Summary (Last 24 hours) at 07/21/2019 0850 Last data filed at 07/21/2019 0000 Gross per 24 hour  Intake 157.2 ml  Output --  Net 157.2 ml      Physical Exam   General:  NAD.  HEENT: normal  Neck: supple. JVP not elevated.  Carotids 2+ bilat; no bruits. No lymphadenopathy or thryomegaly appreciated. Cor: LVAD hum.  Lungs: Clear. Abdomen: obese soft, nontender, non-distended. No hepatosplenomegaly. No bruits or masses. Good bowel sounds. Driveline site clean. Anchor in place.  Extremities: no cyanosis, clubbing, rash. Warm no  edema  Neuro: alert & oriented x 3. No focal deficits. Moves all 4 without problem   Telemetry   NSR 60-70s Personally reviewed    Labs    CBC Recent Labs    07/20/19 0440 07/21/19 0430  WBC 7.2 7.2  HGB 11.5* 11.7*  HCT 37.0* 37.9*  MCV 95.4 94.0  PLT 239 409   Basic Metabolic Panel Recent Labs    07/20/19 0440 07/21/19 0430  NA 136 134*  K 4.1 4.4  CL 96* 94*  CO2 30 30  GLUCOSE 112* 100*  BUN 15 17  CREATININE 1.25* 1.34*  CALCIUM 9.1 8.9  MG 1.9 2.0   Liver Function Tests No results for input(s): AST, ALT, ALKPHOS, BILITOT, PROT, ALBUMIN in the last 72 hours. No results for input(s): LIPASE, AMYLASE in the last 72 hours. Cardiac Enzymes No results for input(s): CKTOTAL, CKMB, CKMBINDEX, TROPONINI in the last 72 hours.  BNP: BNP (last 3 results) Recent Labs    07/02/19 0531 07/09/19 0313 07/16/19 0346  BNP 1,646.4* 1,815.2* 3,431.7*    ProBNP (last 3 results) No results for input(s): PROBNP in the last 8760 hours.   D-Dimer No results for input(s): DDIMER in the last 72 hours. Hemoglobin A1C No results for input(s): HGBA1C in the last 72 hours. Fasting Lipid Panel No results for input(s): CHOL, HDL, LDLCALC, TRIG, CHOLHDL, LDLDIRECT in the last 72 hours. Thyroid Function Tests No results for input(s): TSH, T4TOTAL,  T3FREE, THYROIDAB in the last 72 hours.  Invalid input(s): FREET3  Other results:   Imaging    No results found.   Medications:     Scheduled Medications:  amiodarone  400 mg Oral BID   aspirin  81 mg Oral Daily   atorvastatin  80 mg Oral Daily   bisacodyl  10 mg Oral Daily   Or   bisacodyl  10 mg Rectal Daily   chlorhexidine  15 mL Mouth Rinse BID   Chlorhexidine Gluconate Cloth  6 each Topical Daily   clonazePAM  0.5 mg Oral BID   colchicine  0.6 mg Oral BID   digoxin  0.125 mg Oral Daily   docusate sodium  100 mg Oral Daily   folic acid  1 mg Oral Daily   gabapentin  300 mg Oral TID   insulin  aspart  0-5 Units Subcutaneous QHS   insulin aspart  0-9 Units Subcutaneous TID WC   mouth rinse  15 mL Mouth Rinse q12n4p   melatonin  5 mg Oral QPC supper   multivitamin with minerals  1 tablet Oral Daily   pantoprazole  40 mg Oral Daily   polyethylene glycol  17 g Oral Daily   potassium chloride  40 mEq Oral BID   ranolazine  500 mg Oral BID   sodium chloride flush  10-40 mL Intracatheter Q12H   sodium chloride flush  3 mL Intravenous Q12H   sodium chloride flush  3 mL Intravenous Q12H   thiamine  100 mg Oral Daily   torsemide  20 mg Oral Daily   warfarin  7.5 mg Oral ONCE-1600   Warfarin - Pharmacist Dosing Inpatient   Does not apply q1600    Infusions:  sodium chloride     sodium chloride Stopped (07/15/19 0129)   heparin Stopped (07/21/19 0715)   lactated ringers      PRN Medications: sodium chloride, acetaminophen, ALPRAZolam, bisacodyl, calcium carbonate, dextrose, fentaNYL (SUBLIMAZE) injection, ondansetron (ZOFRAN) IV, oxyCODONE, pneumococcal 23 valent vaccine, polyvinyl alcohol, sodium chloride flush, sodium chloride flush, traMADol, traZODone    Patient Profile   Aaron [name] --post MI cardiogenic shock/ optimization prior to anticipated CABG, at the request of Dr. Marlou Porch, Cardiology.    Assessment/Plan   1. Refractory VT/VF  - developed VF on 7/1 x 2 requiring emergent DC-CV - On 7/2 had self-terminating episode - On 7/3 persistent VF/torsades -> DC-CV - On 7/6  VF/torsades.  - On 7/7 VF /Torsades on 2 separate occasions-> DC-CV x2.  - On 7/8 multiple episodes on VT/VF (incessant) -> unclear which intervention broke it   - Now off mexiletine and on amiodarone 400 mg bid.  - Keep K> 4.0 Mg >2.0 (4.4 and 2.0 today) - VAD speed initially reduced to 5000 but output low. Now at 5200.  - Keep off inotropes with VT.  - No ventricular arrhythmias on telemetry - I have d/w EP today regarding role of ICD for him. They will discuss.   2.  Acute Anterolateral STEMI/ Multivessel CAD - Emergent cath showed severe multivessel disease w/ 75% mid- distal LM, 100% prox LAD, not amendable to PCI (unable to cross w/ guide wire, probable CTO), 100% prox LCx (probable CTO) and 100% ostial RCA (probable CTO). There were mild to moderate collaterals right to left to the LAD, minimal right to right collaterals an minimal left to left collaterals. LVEF 25% - Hs trop peaked 27,000. - No targets for CABG / PCI.   - S/P HMIII  VAD - suspect VF was ischemically-mediated - No s/s ischemia currently  3.  Acute Systolic Heart Failure --->Cardiogenic Shock - 2/2 acute MI. LVEF by LVG 25% - IABP out 6/18 but replaced 6/23. Removed in OR on 6/24  - HMIII LVAD  Placed 6/24 - Extubated 6/25  - Was on milrinone for RV failure. Now off due to VF.  - IABP removed 7/13 - CO-OX 61% - Volume status looks good. Weight dropping. May be able to stop torsemide soon.  - Midodrine discontinued, MAPs 80-90s - Continue digoxin for RV support.   - LDH  Peaked  2600 but now down to 244. No evidence pump thrombosis  - Ramp echo 7/8 RV only mildly reduced. Good cannula position. Increase VAD speed as tolerated, currently 5200.  Would repeat ramp echo prior to discharge (early this week) now that ventricular arrhythmias have calmed, may be able to increase  speed further.  - INR 1.4. Continue coumadin + heparin bridge. Discussed dosing with PharmD personally. - has mild superficial driveline irritation. Continue to follow.   4. Acute hypoxic respiratory failure - intubated 7/8. Extubated 7/13 - Chest CT with bilateral PNA R>L - completed abx therapy w/ vanc/cefipime - PCT 1.97 => 1.3 => 0.6.  - BCx and BAL NGTD - Currently AF w/ normal WBC ct  - No change  5. Shock liver - Resolved.  6. Tobacco Abuse - 30 pack years  - smoking cessation advised  - Will need to quit if to be considered for heart transplant in the future   7. Renal mass = RCC - MRI abdomen-  1.8 cm right renal lesion, most consistent with renal cell carcinoma. No evidence of renal vein involvement or nodal metastasis. - Discussed w/ Urology and R RCC is small and will just require surveillance.  Ok to proceed with VAD from their standpoint.   8. Hyponatremia  - Resolved.   9. Sleep Apnea - frequent apnea at night  - needs formal sleep study   - CPAP ordered while inpatient  10. Anemia - Post-op, increasing hgb 11.7 today  11. Debility - continue to mobilize w/ PT, walking more.  - CIR recommended. - stable for d/c to CIR from cardiac standpoint. Discussed w/ CIR again today. Awaiting approval/bed.   Will hold heparin this am for removal of EPW around noon (d/w Dr. PVT). Await EP input on need for ICD.    Glori Bickers, MD  07/21/2019 8:50 AM  Patient seen and examined with the above-signed Advanced Practice Provider and/or Housestaff. I personally reviewed laboratory data, imaging studies and relevant notes. I independently examined the patient and formulated the important aspects of the plan. I have edited the note to reflect any of my changes or salient points. I have personally discussed the plan with the patient and/or family.   Feels ok. Able to ambulate halls. No further VT. MAPs and VAD parameters stable with occasional PI events. On heparin/warfarin. INR 1.3 Appetite ok but not liking food. No longer pacing  General:  NAD.  HEENT: normal  Neck: supple. JVP not elevated.  Carotids 2+ bilat; no bruits. No lymphadenopathy or thryomegaly appreciated. Cor: LVAD hum.  Lungs: Clear. Abdomen: obese soft, nontender, non-distended. No hepatosplenomegaly. No bruits or masses. Good bowel sounds. Driveline site clean. Anchor in place.  Extremities: no cyanosis, clubbing, rash. Warm no edema  Neuro: alert & oriented x 3. No focal deficits. Moves all 4 without problem   Ok for d/c to CIR when bed available.  Continue heparin -> coumadin. Discussed dosing with PharmD  personally. Recheck co-ox.   Glori Bickers, MD  8:50 AM

## 2019-07-21 NOTE — Progress Notes (Signed)
Outpatient CSW continues to follow for supportive needs throughout implant hospitalization.  Per VAD team pt awaiting bed at CIR and no social work needs at this time.  Jorge Ny, LCSW Clinical Social Worker [facility] Desk#: [phone] Cell#: [phone]

## 2019-07-21 NOTE — Progress Notes (Signed)
VAD Discharge Teaching Note:  Discharge VAD teaching completed with    The home inspection checklist has been reviewed and no unsafe conditions have been identified. Family reports that there are at least two dedicated grounded, 3-prong outlets with clearly labeled circuit breaker has been established in the bedroom for power module and Char[employer] fundraiser.   Both patient and caregivers have been trained on the following:  1. HM III LVAD overview of system operations  2. Overview of major lifestyle accommodations and cautions   3. Overview of system components (features and functions) 4. Changing power sources 5. Overview of alerts and alarms 6. How to identify and manage an emergency including when pump is running and when pump has stopped  7. Changing system controller 8. Maintain emergency contact list and medications  The patient and caregiver(s) have successfully demonstrated:  1. Changing power source (from batteries to mobile power unit, mobile power unit to batteries, and replacing batteries) 2. Perform system controller self test  3. Check and charge batteries  4. Change system controller 5. Paged VAD pager and programmed number in phones  A daily flow sheet with patient  weight, temperature,  flow, speed, power, and PI, along with daily self checks on system controller and power module have been performed by patient and caregiver(s) during hospitalization and will also be done daily at home.   The caregiver(s) has been trained on percutaneous lead exit site care, care of the driveline and dressing changes. They have performed dressing changes during patient's hospitalization under my supervision with the support of the nursing staff. The importance of lead immobilization has been stressed to patient and caregiver(s) using the attachment device. The caregiver has successfully demonstrated the following: 1. Cleansing site with sterile technique 2. Dressing care and maintenance  3.  Immobilizing driveline  The following routine activities and maintenance have been reviewed with patient and caregiver(s) and both verbalize understanding:  1. Stressed importance of never disconnecting power from both controller power leads at the same time, and never disconnecting both batteries at the same time, or the pump will stop 2. Plug the mobile power unit (MPU) and the universal battery charger (UBC) into properly grounded (3 prong) outlets dedicated to PM use. Do NOT use adapter (cheater plug) for ungrounded outlets or multiple portable socket outlets (power strips) 3. Do not connect the PM or MPU to an outlet controlled by wall switch or the device may not work 4. Transfer from MPU to batteries during [facility] mains power failure. The PM has internal backup battery that will power the pump while you transfer to batteries 5. Keep a backup system controller, charged batteries, battery clips, and flashlight near you during sleep in case of electrical power outage 6. Clean battery, battery clip, and universal battery charger contacts weekly 7. Visually inspect percutaneous lead daily 8. Check cables and connectors when changing power source  9. Rotate batteries; keep all eight batteries charged 10. Always have backup system controller, battery clips, fully charged batteries, and spare fully charged batteries when traveling 11. Re-calibrate batteries every 70 uses; monitor battery life of 36 months or 360 uses; replace batteries at end of battery life   Identified the following changes in activities of daily living with pump:  1. No driving for at least six weeks and then only if doctor gives permission to do so 2. No tub baths while pump implanted, and shower only if doctor gives permission 3. No swimming or submersion in water while implanted with pump  4. Keep all VAD equipment away from water or moisture 5. Keep all VAD connections clean and dry 6. No contact sports or engage in jumping  activities 7. Avoid strong static electricity (touching TV/computer screens, vacuuming) 8. Never have an MRI while implanted with the pump 9. Never leave or store batteries in extremely hot or cold places (such as   trunk of your car), or the battery life will be shortened 10. Call the doctor or hospital contact person if any change in how the pump sounds, feels, or works 11. Plan to sleep only when connected to the power module. 12. Keep a backup system controller, charged batteries, battery clips, and flashlight near you during sleep in case of electrical power outage 13. Do not sleep on your stomach 14. Talk with doctor before any long distance travel plans 15. Patient will need antibiotics prior to any dental procedure; instructed to contact VAD coordinator before any dental procedures (including routine cleaning)   Discharge binder given to patient and include the following: 1. List of emergency contacts 2. Wallet card 3. HM III Luggage tags 4. HM III Alarms for Patients and their Caregivers 5. HM III Patient Handbook 6. HM III Patient Education Program DVD 7. Daily diary sheets 8. Warfarin teaching sheets 9. Nosebleed teaching sheets 10. Medications you may and may not take with CHF list  Following notification process completed with:  [place]     Discussed frequency and importance of INR checks; emphasized importance of maintaining INR goal to prevent clotting and or bleeding issues with pump.  Able to answer questions and asked good questions pertaining to warfarin and diet/lifestyle changes necessary to be successful and safe. INR goal is 2.0 - 2.5.  He has expressed interest in obtaining home INR monitor. Will work with him in the future for this equipment.    The patient has completed a proficiency test for the HM III and all questions have been answered. The pt and family have been instructed to call if any questions, problems, or  concerns arise. Pt and caregiver successfully paged VAD coordinator using VAD pager emergency number and have been instructed to use this number only for emergencies.  Patient and caregiver(s) asked appropriate questions, had good interaction with VAD coordinator, and verbalized understanding of above instructions.   Emerson Monte RN [place] Coordinator  Office: [phone]  24/7 Pager: [phone]

## 2019-07-21 NOTE — Procedures (Signed)
Patient declined CPAP for tonight.  

## 2019-07-21 NOTE — Progress Notes (Addendum)
LVAD Coordinator Rounding Note:  Transferred from [facility] ED on 06/17/19 with STEMI. Cath revealed multi-vessel CAD, wire would not cross to open LAD; IABP placed and pt transferred here for evaluation for CABG.   Dr. Orvan Seen evaluated and found he was not amenable to surgical or percutaneous revascularization due to poor targets. With severe depressed LV function and little coronary perfusion, LVAD was recommended.   HM III LVAD implanted on 06/25/19 by Dr. Orvan Seen under Destination Therapy criteria due to recent smoking history.  Developed sepsis/PNA with respiratory failure. Had refractory VT/VF. IABP/swan placed 07/09/19 - discontinued 07/14/19  Pt sitting up in bed watching TV on my arrival. States he is feeling "pretty good" this morning. Reports that he read his teaching notebook yesterday.   Drive line wound culture sent per Dr Prescott Gum. Order in to remove pacing wires this afternoon.   Discharge teaching completed with pt, sister, and ex-wife. See separate note.   Vital signs: Temp: 98.4 HR: 68 SR w/ PVCs Auto BP: 125/84 (95) Doppler: 86 O2 Sat: 94% on RA Wt:207>225.7>222.6>214.3>213.4>203.9>205.3>209>212......207>207.9>207.6>212.1>207.6>193.7 lbs  LVAD parameters: Speed:  5200 Flow: 4.1 Power:  3.7w PI: 3.6 Hct: 33  Alarms: none Events: rare  Fixed speed: 5200 Low speed limit: 4800   Drive Line: Dressing change performed today by Otila Kluver with VAD coordinator supervision.  Existing VAD dressing removed and site care performed using sterile technique. Drive line exit site cleaned with Chlora prep applicators x 2, allowed to dry, and gauze dressing WITHOUT calcium aliginate strip re-applied. Exit site unincorporated, erythema around exit site persists, small amount yellow drainage with no foul odor noted. Rash improved around drive line, but rash still present along left flank. The velour is fully implanted at exit site. Anchor re-applied around drive line. Wound  culture obtained today. Daily dressing changes per VAD Coordinator, Nurse Davonna Belling, or trained caregiver. Next dressing change due: 07/22/19.         Labs:  LDH trend: 660>398>347>459>462>410>554>383>490>1752>2605>548>447>375>338>320>317>257>257>244  INR trend: 1.4>1.3>1.1>1.2>1.4>1.5>1.7>2.7>3.6>5.9>4.1>4.7>4.7>4.2>3.8>1.6>1.4>1.5>1.3>1.3>1.4  WBC trend: 22.9>19.2>13.5>12.3>10.1>8.0>12.1>9.5>8.0>7.2  AST/ALT: 1854/1377>3004/2497>1441/2080>>686/1554>403/1162> 992/426>834/196   Anticoagulation Plan: -INR Goal:  2.0 - 2.5 -ASA Dose: 81 mg   Blood Products:  -Intra-op - 06/25/19>>6 units FFP  - 07/09/19>>2 FFP - 07/10/19>>2 FFP  Device: N/A  Drips: Amiodarone>>30mg /hr Heparin>>500 u/hr  Tube Feeding: stopped 07/14/19  Arrythmias: was on Amiodarone pre implant for NSVT - 2 episodes of VF requiring emergent DC-CV on 07/02/19 - Recurrent VF on 7/2 and broke spontaneously.  - 7/3 Recurrent VF at 2a.-> emergent DC-CV.  - 07/07/2019 VT/Torsades -> repeat DC_CV - 07/08/2019 VF/Torsades x2--> DC-CV x2.  - 07/09/19 Multiple episodes of VT/VF -> IABP placed. AV pacing via external pacing wires - underlying rhythm bradycardia 40 bpm; external AV pacing wires remain intact  Respiratory: extubated 06/26/19 - re-intubated 07/09/19 for sepsis/pneumoni - extubated 07/14/19   Nitric Oxide: off 06/26/19  - Restarted 07/10/19 - Off 07/14/19  VAD Education: 1. Sister-Joanne is checked off to perform driveline dressing changes independently.  2. Pt's ex-wife Otila Kluver performed dressing change today with verbal cues from VAD coordinator. Will need one more supervised dressing change prior to check-off. 3. Discharge education completed today with patient, [name], and Otila Kluver. See separate note for documentation.    Plan/Recommendations:  1. Call VAD pager if any VAD equipment or drive line issues. 2. Daily day dressing changes per VAD Coordinator, Nurse Davonna Belling, or pts sister.   Emerson Monte RN [place]  Coordinator  Office: [phone]  24/7 Pager: [phone]

## 2019-07-21 NOTE — Progress Notes (Signed)
[place] for Coumadin + heparin Indication: LVAD  Allergies  Allergen Reactions  . Codeine Hives    Patient Measurements: Height: 5\' 9"  (175.3 cm) Weight: 85 kg (187 lb 6.3 oz) IBW/kg (Calculated) : 70.7 Heparin Dosing Weight: ~ 92 kg  Vital Signs: Temp: 98.5 F (36.9 C) (07/20 0400) Temp Source: Oral (07/20 0400) BP: 93/56 (07/20 0400) Pulse Rate: 97 (07/19 2332)  Labs: Recent Labs    07/19/19 9476 07/19/19 0813 07/19/19 0813 07/20/19 0440 07/20/19 0450 07/21/19 0430  HGB  --  11.2*   < > 11.5*  --  11.7*  HCT  --  35.4*  --  37.0*  --  37.9*  PLT  --  232  --  239  --  256  LABPROT 14.7  --   --  15.4*  --  16.9*  INR 1.2  --   --  1.3*  --  1.4*  HEPARINUNFRC <0.10*  --   --   --  <0.10* <0.10*  CREATININE 1.22  --   --  1.25*  --  1.34*   < > = values in this interval not displayed.    Estimated Creatinine Clearance: 67.3 mL/min (A) (by C-G formula based on SCr of 1.34 mg/dL (H)).  Assessment: 54 yo male s/p LVAD HM3 placement 6/24.  Pharmacy asked to dose anticoagulation with IV heparin and Coumadin. No overt bleeding or complications noted.    IABP was pulled and pt extubated on 7/13.   INR 1.4 today after another inc dose yesterday - previously sensitive to inc warfarin doses and have been conservative but now patient moe stable and had been eating more. He states he will not eat hospital-provided food but will eat food brought from home by family. Hgb stable 10-11, plt 200s. LDH down to 200s. Heparin level remains undetectable on fixed low-dose heparin 500 units/hr.   Goal of Therapy:  Heparin level: 0.2-0.5 - currently planned for fixed dose heparin  INR 2-2.5  Monitor platelets by anticoagulation protocol: Yes   Plan:  -Warfarin 7.5 mg PO x1 today -Continue heparin infusion at 500 units/hr until INR therapeutic -Daily INR, CBC, LDH, s/sx of bleeding, HL  Vertis Kelch, PharmD, BCPS Phone [phone] 07/21/2019       7:11 AM  Please check [email or web address] for unit-specific pharmacist phone numbers

## 2019-07-21 NOTE — Plan of Care (Signed)
  Problem: Education: Goal: Individualized Educational Video(s) Outcome: Progressing   Problem: Activity: Goal: Ability to tolerate increased activity will improve Outcome: Progressing   Problem: Cardiac: Goal: Ability to achieve and maintain adequate cardiopulmonary perfusion will improve Outcome: Progressing   Problem: Education: Goal: Knowledge of General Education information will improve Description: Including pain rating scale, medication(s)/side effects and non-pharmacologic comfort measures Outcome: Progressing   Problem: Health Behavior/Discharge Planning: Goal: Ability to manage health-related needs will improve Outcome: Progressing   Problem: Clinical Measurements: Goal: Will remain free from infection Outcome: Progressing Goal: Diagnostic test results will improve Outcome: Progressing Goal: Respiratory complications will improve Outcome: Progressing

## 2019-07-21 NOTE — Plan of Care (Signed)
  Problem: Education: Goal: Understanding of cardiac disease, CV risk reduction, and recovery process will improve Outcome: Progressing Goal: Understanding of medication regimen will improve Outcome: Progressing   Problem: Education: Goal: Knowledge of General Education information will improve Description: Including pain rating scale, medication(s)/side effects and non-pharmacologic comfort measures Outcome: Progressing   Problem: Clinical Measurements: Goal: Ability to maintain clinical measurements within normal limits will improve Outcome: Progressing

## 2019-07-22 DIAGNOSIS — I255 Ischemic cardiomyopathy: Secondary | ICD-10-CM

## 2019-07-22 DIAGNOSIS — I509 Heart failure, unspecified: Secondary | ICD-10-CM

## 2019-07-22 LAB — CBC
HCT: 39.2 % (ref 39.0–52.0)
Hemoglobin: 12.1 g/dL — ABNORMAL LOW (ref 13.0–17.0)
MCH: 28.9 pg (ref 26.0–34.0)
MCHC: 30.9 g/dL (ref 30.0–36.0)
MCV: 93.6 fL (ref 80.0–100.0)
Platelets: 268 10*3/uL (ref 150–400)
RBC: 4.19 MIL/uL — ABNORMAL LOW (ref 4.22–5.81)
RDW: 16.3 % — ABNORMAL HIGH (ref 11.5–15.5)
WBC: 8.5 10*3/uL (ref 4.0–10.5)
nRBC: 0 % (ref 0.0–0.2)

## 2019-07-22 LAB — BASIC METABOLIC PANEL
Anion gap: 11 (ref 5–15)
BUN: 18 mg/dL (ref 6–20)
CO2: 30 mmol/L (ref 22–32)
Calcium: 9.3 mg/dL (ref 8.9–10.3)
Chloride: 93 mmol/L — ABNORMAL LOW (ref 98–111)
Creatinine, Ser: 1.26 mg/dL — ABNORMAL HIGH (ref 0.61–1.24)
GFR calc Af Amer: >60 mL/min (ref >60)
GFR calc non Af Amer: >60 mL/min (ref >60)
Glucose, Bld: 97 mg/dL (ref 70–99)
Potassium: 4.5 mmol/L (ref 3.5–5.1)
Sodium: 134 mmol/L — ABNORMAL LOW (ref 135–145)

## 2019-07-22 LAB — GLUCOSE, CAPILLARY
Glucose-Capillary: 137 mg/dL — ABNORMAL HIGH (ref 70–99)
Glucose-Capillary: 130 mg/dL — ABNORMAL HIGH (ref 70–99)
Glucose-Capillary: 118 mg/dL — ABNORMAL HIGH (ref 70–99)
Glucose-Capillary: 103 mg/dL — ABNORMAL HIGH (ref 70–99)

## 2019-07-22 LAB — PROTIME-INR
INR: 1.8 — ABNORMAL HIGH (ref 0.8–1.2)
Prothrombin Time: 20.2 seconds — ABNORMAL HIGH (ref 11.4–15.2)

## 2019-07-22 LAB — COOXEMETRY PANEL
Carboxyhemoglobin: 1.5 % (ref 0.5–1.5)
Methemoglobin: 0.8 % (ref 0.0–1.5)
O2 Saturation: 53.4 %
Total hemoglobin: 13.3 g/dL (ref 12.0–16.0)

## 2019-07-22 LAB — LACTATE DEHYDROGENASE: LDH: 256 U/L — ABNORMAL HIGH (ref 98–192)

## 2019-07-22 LAB — MAGNESIUM: Magnesium: 1.9 mg/dL (ref 1.7–2.4)

## 2019-07-22 MED ORDER — ENSURE ENLIVE PO LIQD
237.0000 mL | Freq: Two times a day (BID) | ORAL | Status: DC
  Administered 2019-07-22 – 2019-07-24 (×4): 237 mL via ORAL

## 2019-07-22 MED ORDER — MAGNESIUM OXIDE 400 (241.3 MG) MG PO TABS
400.0000 mg | ORAL_TABLET | Freq: Every day | ORAL | Status: DC
  Administered 2019-07-22 – 2019-07-23 (×2): 400 mg via ORAL
  Filled 2019-07-22 (×2): qty 1

## 2019-07-22 MED ORDER — WARFARIN SODIUM 5 MG PO TABS
5.0000 mg | ORAL_TABLET | Freq: Once | ORAL | Status: AC
  Administered 2019-07-22: 5 mg via ORAL
  Filled 2019-07-22: qty 1

## 2019-07-22 MED ORDER — MAGNESIUM SULFATE 2 GM/50ML IV SOLN
2.0000 g | Freq: Once | INTRAVENOUS | Status: AC
  Administered 2019-07-22: 2 g via INTRAVENOUS
  Filled 2019-07-22: qty 50

## 2019-07-22 MED ORDER — HYDROCORTISONE 1 % EX CREA
TOPICAL_CREAM | Freq: Three times a day (TID) | CUTANEOUS | Status: DC
  Administered 2019-07-22 – 2019-07-24 (×3): 1 via TOPICAL
  Filled 2019-07-22: qty 28

## 2019-07-22 MED ORDER — SODIUM CHLORIDE 0.9 % IV BOLUS
1000.0000 mL | Freq: Once | INTRAVENOUS | Status: AC
  Administered 2019-07-22: 1000 mL via INTRAVENOUS

## 2019-07-22 NOTE — Progress Notes (Signed)
Occupational Therapy Treatment Patient Details Name: Aaron [name] MRN: [id number] DOB: May 19, 1965 Today's Date: 07/22/2019    History of present illness Pt is a 54 y.o. admitted 06/17/19 with STEMI with multivessel disease; cath with no viable targets for CABG. Pt s/p HeartMate 3 LVAD implantation 6/24. VT 2x on 7/1 s/p DCCV. 7/7 pt with torsades. 7/8 pt intubated, bronchoscopy with repeat Vtach s/p cardioversion and IABP placed. IABP removed 7/13 and extubated 7/13. PMH includes HTN, tobacco use (30pack yrs)   OT comments  Focus of session on ADL training with pt completing standing grooming with supervision and bathing and dressing with min assist. He demonstrated good endurance having been seen directly after PT session. Pt with difficulty sequencing returning LVAD power source from battery to wall. Continues to be appropriate for CIR.  Follow Up Recommendations  CIR    Equipment Recommendations  3 in 1 bedside commode    Recommendations for Other Services      Precautions / Restrictions Precautions Precautions: Fall;Sternal Precaution Comments: LVAD       Mobility Bed Mobility Overal bed mobility: Modified Independent            General bed mobility comments: to return to supine  Transfers Overall transfer level: Needs assistance Equipment used: Rolling walker (2 wheeled) Transfers: Sit to/from Stand Sit to Stand: Supervision         General transfer comment: cues for hand placement    Balance Overall balance assessment: Needs assistance Sitting-balance support: No upper extremity supported;Feet supported Sitting balance-Leahy Scale: Good       Standing balance-Leahy Scale: Fair Standing balance comment: Can maintain static standing without UE support; reliant on UE support or external assist for dynamic stability                        ADL either performed or assessed with clinical judgement   ADL Overall ADL's : Needs assistance/impaired      Grooming: Oral care;Standing;Supervision/safety;Wash/dry hands;Wash/dry face;Applying deodorant;Sitting;Set up Grooming Details (indicate cue type and reason): oral care standing, face/hands/combing hair in chair Upper Body Bathing: Minimal assistance;Sitting Upper Body Bathing Details (indicate cue type and reason): assisted with back Lower Body Bathing: Supervison/ safety;Sit to/from stand   Upper Body Dressing : Minimal assistance;Sitting Upper Body Dressing Details (indicate cue type and reason): assisted for LVAD vest Lower Body Dressing: Set up;Sitting/lateral leans       Toileting- Clothing Manipulation and Hygiene: Supervision/safety;Sit to/from stand       Functional mobility during ADLs: Supervision/safety;Rolling walker       Vision       Perception     Praxis      Cognition Arousal/Alertness: Awake/alert Behavior During Therapy: WFL for tasks assessed/performed;Flat affect Overall Cognitive Status: Impaired/Different from baseline Area of Impairment: Problem solving;Memory                     Memory: Decreased recall of precautions       Problem Solving: Difficulty sequencing (some difficulty with managing LVAD equipment) General Comments: pt needing assist and verbal cues to switch from battery back to wall power        Exercises     Shoulder Instructions       General Comments VSS on RA    Pertinent Vitals/ Pain       Pain Assessment: No/denies pain Pain Intervention(s): Monitored during session  Home Living  Prior Functioning/Environment              Frequency  Min 2X/week        Progress Toward Goals  OT Goals(current goals can now be found in the care plan section)  Progress towards OT goals: Progressing toward goals  Acute Rehab OT Goals Patient Stated Goal: to get back home to his dog "[name]" OT Goal Formulation: With patient Time For Goal  Achievement: 07/27/2019 Potential to Achieve Goals: Good  Plan Discharge plan remains appropriate    Co-evaluation                 AM-PAC OT "6 Clicks" Daily Activity     Outcome Measure   Help from another person eating meals?: None Help from another person taking care of personal grooming?: A Little Help from another person toileting, which includes using toliet, bedpan, or urinal?: A Little Help from another person bathing (including washing, rinsing, drying)?: A Little Help from another person to put on and taking off regular upper body clothing?: A Little Help from another person to put on and taking off regular lower body clothing?: A Little 6 Click Score: 19    End of Session Equipment Utilized During Treatment: Gait belt;Rolling walker  OT Visit Diagnosis: Unsteadiness on feet (R26.81);Muscle weakness (generalized) (M62.81)   Activity Tolerance Patient tolerated treatment well   Patient Left in bed;with call bell/phone within reach;with bed alarm set   Nurse Communication          Time: 0511-0211 OT Time Calculation (min): 48 min  Charges: OT General Charges $OT Visit: 1 Visit OT Treatments $Self Care/Home Management : 38-52 mins  Nestor Lewandowsky, OTR/L Acute Rehabilitation Services Pager: [phone] Office: [phone]   Malka So 07/22/2019, 4:14 PM

## 2019-07-22 NOTE — Progress Notes (Signed)
Inpatient Rehabilitation-Admissions Coordinator   Unfortunately I do not have a bed available for this patient today. Will continue to follow up for possible admit, pending medical stability and bed availability.   Raechel Ache, OTR/L  Rehab Admissions Coordinator  [phone] 07/22/2019 10:46 AM

## 2019-07-22 NOTE — Progress Notes (Addendum)
Patient ID: Aaron [name], male   DOB: Nov 01, 1965, 54 y.o.   MRN: [id number]     Advanced Heart Failure Rounding Note  PCP-Cardiologist: No primary care provider on file.   Subjective:    - HM3 placement 6/24  - Had 2 episodes of VF requiring emergent DC-CV on 07/02/19 - Recurrent VF on 7/2 and broke spontaneously.  - 7/3 Recurrent VF at 2a.-> emergent DC-CV.  - 07/07/2019 VT/Torsades -> repeat DC_CV - 07/08/2019 VF/Torsades x2--> DC-CV x2.  - 07/09/19 developed sepsis/PNA -> intubated - 07/09/19 Multiple episodes of VT/VF -> IABP placed. AV pacing via EPW - 07/14/19 IABP removed and extubated. Pacing discontinued.  Co-ox lower today at 53%. No CP or dyspnea. No further VT.  CVP 8. Wt down 3 lb from yesterday.   On heparin. INR 1.8. No bleeding. Hgb stable at 12.   Feel last night trying to get out of bed, reports he slipped on his bed sheet that was on the floor. Feel to knees. Denies any significant injury. Did not hit head. Feels ok today.   Awaiting CIR bed.   LVAD Interrogation HM III: Speed: 5200 Flow: 4.1 PI: 5.0 Power: 3.6.  1 PI event VAD interrogated personally. Parameters stable.   Objective:   Weight Range: 83.6 kg Body mass index is 27.22 kg/m.   Vital Signs:   Temp:  [97.5 F (36.4 C)-98.6 F (37 C)] 97.7 F (36.5 C) (07/21 0714) Pulse Rate:  [64-111] 69 (07/21 0714) Resp:  [12-19] 14 (07/21 0714) BP: (81-125)/(70-92) 98/88 (07/21 0714) SpO2:  [94 %-100 %] 100 % (07/21 0714) Weight:  [83.6 kg] 83.6 kg (07/21 0500) Last BM Date: 07/21/19  Weight change: Filed Weights   07/20/19 0440 07/21/19 0400 07/22/19 0500  Weight: 87.9 kg 85 kg 83.6 kg    Intake/Output:   Intake/Output Summary (Last 24 hours) at 07/22/2019 0742 Last data filed at 07/22/2019 0500 Gross per 24 hour  Intake 636.04 ml  Output 200 ml  Net 436.04 ml      Physical Exam   CVP 8 General:  Well appearing. Sitting up in bed. No distress HEENT: normal  Neck: supple. JVP not elevated.   Carotids 2+ bilat; no bruits. No lymphadenopathy or thryomegaly appreciated. Cor: LVAD hum.  Lungs: Clear. No wheezing  Abdomen: obese soft, nontender, non-distended. No hepatosplenomegaly. No bruits or masses. Good bowel sounds. Driveline site clean. Anchor in place.  Extremities: no cyanosis, clubbing, rash. No LEE Neuro: alert & oriented x 3. No focal deficits. Moves all 4 without problem   Telemetry   NSR 70s. No VT  Personally reviewed    Labs    CBC Recent Labs    07/21/19 0430 07/22/19 0420  WBC 7.2 8.5  HGB 11.7* 12.1*  HCT 37.9* 39.2  MCV 94.0 93.6  PLT 256 035   Basic Metabolic Panel Recent Labs    07/21/19 0430 07/22/19 0420  NA 134* 134*  K 4.4 4.5  CL 94* 93*  CO2 30 30  GLUCOSE 100* 97  BUN 17 18  CREATININE 1.34* 1.26*  CALCIUM 8.9 9.3  MG 2.0 1.9   Liver Function Tests No results for input(s): AST, ALT, ALKPHOS, BILITOT, PROT, ALBUMIN in the last 72 hours. No results for input(s): LIPASE, AMYLASE in the last 72 hours. Cardiac Enzymes No results for input(s): CKTOTAL, CKMB, CKMBINDEX, TROPONINI in the last 72 hours.  BNP: BNP (last 3 results) Recent Labs    07/02/19 0531 07/09/19 0313 07/16/19 0346  BNP  1,646.4* 1,815.2* 3,431.7*    ProBNP (last 3 results) No results for input(s): PROBNP in the last 8760 hours.   D-Dimer No results for input(s): DDIMER in the last 72 hours. Hemoglobin A1C No results for input(s): HGBA1C in the last 72 hours. Fasting Lipid Panel No results for input(s): CHOL, HDL, LDLCALC, TRIG, CHOLHDL, LDLDIRECT in the last 72 hours. Thyroid Function Tests No results for input(s): TSH, T4TOTAL, T3FREE, THYROIDAB in the last 72 hours.  Invalid input(s): FREET3  Other results:   Imaging    No results found.   Medications:     Scheduled Medications: . amiodarone  400 mg Oral BID  . aspirin  81 mg Oral Daily  . atorvastatin  80 mg Oral Daily  . bisacodyl  10 mg Oral Daily   Or  . bisacodyl  10 mg  Rectal Daily  . chlorhexidine  15 mL Mouth Rinse BID  . Chlorhexidine Gluconate Cloth  6 each Topical Daily  . clonazePAM  0.5 mg Oral BID  . colchicine  0.6 mg Oral BID  . digoxin  0.125 mg Oral Daily  . docusate sodium  100 mg Oral Daily  . folic acid  1 mg Oral Daily  . gabapentin  300 mg Oral TID  . insulin aspart  0-5 Units Subcutaneous QHS  . insulin aspart  0-9 Units Subcutaneous TID WC  . mouth rinse  15 mL Mouth Rinse q12n4p  . melatonin  5 mg Oral QPC supper  . multivitamin with minerals  1 tablet Oral Daily  . pantoprazole  40 mg Oral Daily  . polyethylene glycol  17 g Oral Daily  . potassium chloride  20 mEq Oral Daily  . ranolazine  500 mg Oral BID  . sodium chloride flush  10-40 mL Intracatheter Q12H  . sodium chloride flush  3 mL Intravenous Q12H  . sodium chloride flush  3 mL Intravenous Q12H  . thiamine  100 mg Oral Daily  . torsemide  20 mg Oral Daily  . Warfarin - Pharmacist Dosing Inpatient   Does not apply q1600    Infusions: . sodium chloride    . sodium chloride Stopped (07/15/19 0129)  . heparin 500 Units/hr (07/22/19 0644)  . lactated ringers      PRN Medications: sodium chloride, acetaminophen, ALPRAZolam, bisacodyl, calcium carbonate, dextrose, fentaNYL (SUBLIMAZE) injection, ondansetron (ZOFRAN) IV, oxyCODONE, pneumococcal 23 valent vaccine, polyvinyl alcohol, sodium chloride flush, sodium chloride flush, traMADol, traZODone    Patient Profile   Aaron [name] --post MI cardiogenic shock/ optimization prior to anticipated CABG, at the request of Dr. Marlou Porch, Cardiology.    Assessment/Plan   1. Refractory VT/VF  - developed VF on 7/1 x 2 requiring emergent DC-CV - On 7/2 had self-terminating episode - On 7/3 persistent VF/torsades -> DC-CV - On 7/6  VF/torsades.  - On 7/7 VF /Torsades on 2 separate occasions-> DC-CV x2.  - On 7/8 multiple episodes on VT/VF (incessant) -> unclear which intervention broke it   - Now off mexiletine and on  amiodarone 400 mg bid.  - Keep K> 4.0 Mg >2.0 (4.5 and 1.9 today). Start Mag-ox for persistent hypomagnesemia  - VAD speed initially reduced to 5000 but output low. Now at 5200.  - Keep off inotropes with VT.  - No ventricular arrhythmias on telemetry - I have d/w EP  regarding role of ICD for him. They will discuss.   2. Acute Anterolateral STEMI/ Multivessel CAD - Emergent cath showed severe multivessel disease w/ 75%  mid- distal LM, 100% prox LAD, not amendable to PCI (unable to cross w/ guide wire, probable CTO), 100% prox LCx (probable CTO) and 100% ostial RCA (probable CTO). There were mild to moderate collaterals right to left to the LAD, minimal right to right collaterals an minimal left to left collaterals. LVEF 25% - Hs trop peaked 27,000. - No targets for CABG / PCI.   - S/P HMIII VAD - suspect VF was ischemically-mediated - No s/s ischemia currently  3.  Acute Systolic Heart Failure --->Cardiogenic Shock - 2/2 acute MI. LVEF by LVG 25% - IABP out 6/18 but replaced 6/23. Removed in OR on 6/24  - HMIII LVAD  Placed 6/24 - Extubated 6/25  - Was on milrinone for RV failure. Now off due to VF.  - IABP removed 7/13 - CO-OX marginal at 53% - Volume status looks good. Weight dropping. CVP 8. May be able to stop torsemide soon.  - Midodrine discontinued, MAPs 80-90s - Continue digoxin for RV support.   - LDH  Peaked  2600 but now down to 256. No evidence pump thrombosis  - Ramp echo 7/8 RV only mildly reduced. Good cannula position. Increase VAD speed as tolerated, currently 5200.  Would repeat ramp echo prior to discharge (early this week) now that ventricular arrhythmias have calmed, may be able to increase  speed further.  - INR 1.8. Stop heparin. Continue coumadin. Discussed dosing with PharmD personally. - has mild superficial driveline irritation. Continue to follow.   4. Acute hypoxic respiratory failure - intubated 7/8. Extubated 7/13 - Chest CT with bilateral PNA R>L -  completed abx therapy w/ vanc/cefipime - PCT 1.97 => 1.3 => 0.6.  - BCx and BAL NGTD - Currently AF w/ normal WBC ct  - No change  5. Shock liver - Resolved.  6. Tobacco Abuse - 30 pack years  - smoking cessation advised  - Will need to quit if to be considered for heart transplant in the future   7. Renal mass = RCC - MRI abdomen- 1.8 cm right renal lesion, most consistent with renal cell carcinoma. No evidence of renal vein involvement or nodal metastasis. - Discussed w/ Urology and R RCC is small and will just require surveillance.  Ok to proceed with VAD from their standpoint.   8. Hyponatremia  - 134 today  9. Sleep Apnea - frequent apnea at night  - needs formal sleep study   - CPAP ordered while inpatient  10. Anemia - Post-op, increasing hgb 12.1 today  11. Debility - continue to mobilize w/ PT, walking more.  - CIR recommended. - stable for d/c to CIR from cardiac standpoint. Discussed w/ CIR again today. Awaiting approval/bed.     Nelida Gores  07/22/2019 7:42 AM  Patient seen and examined with the above-signed Advanced Practice Provider and/or Housestaff. I personally reviewed laboratory data, imaging studies and relevant notes. I independently examined the patient and formulated the important aspects of the plan. I have edited the note to reflect any of my changes or salient points. I have personally discussed the plan with the patient and/or family.   Had fall this am with orthostatic symptoms. Otherwise feels ok. Pacing wires pulled. Heparin restarted but then stopped for INR 1.8  No bleeding.   General:  NAD.  HEENT: normal  Neck: supple. JVP not elevated.  Carotids 2+ bilat; no bruits. No lymphadenopathy or thryomegaly appreciated. Cor: LVAD hum.  Lungs: Clear. Abdomen: obese soft, nontender, non-distended. No hepatosplenomegaly. No  bruits or masses. Good bowel sounds. Driveline site clean. Anchor in place.  Extremities: no cyanosis,  clubbing, rash. Warm no edema  Neuro: alert & oriented x 3. No focal deficits. Moves all 4 without problem   Suspect he is volume deplete. Stop torsemide. Give 1L NS. Discussed with EP. No ICD currently. INR 1.8. Off heparin. Continue warfarin Discussed dosing with PharmD personally.  [place] for [employer] today.   Glori Bickers, MD  9:03 AM

## 2019-07-22 NOTE — Progress Notes (Addendum)
LVAD Coordinator Rounding Note:  Transferred from [facility] ED on 06/17/19 with STEMI. Cath revealed multi-vessel CAD, wire would not cross to open LAD; IABP placed and pt transferred here for evaluation for CABG.   Dr. Orvan Seen evaluated and found he was not amenable to surgical or percutaneous revascularization due to poor targets. With severe depressed LV function and little coronary perfusion, LVAD was recommended.   HM III LVAD implanted on 06/25/19 by Dr. Orvan Seen under Destination Therapy criteria due to recent smoking history.  Developed sepsis/PNA with respiratory failure. Had refractory VT/VF. IABP/swan placed 07/09/19 - discontinued 07/14/19  Pt sitting up in bed watching TV on my arrival. Pt receiving IV bolus at this time. Denies any complaints. Waiting on CIR placement when bed available.   Vital signs: Temp: 97.7 HR: 70 SR  Auto BP: 98/88 (93) Doppler: 88  O2 Sat: 100% on RA Wt:207>225.7>222.6>214.3.....207>207.9>207.6>212.1>207.6>193.7>187.3>184..3 lbs  LVAD parameters: Speed:  5200 Flow: 4.5 Power:  3.8w PI: 3.3 Hct: 35  Alarms: none Events: 5 PI events 07/21/19   Fixed speed: 5200 Low speed limit: 4800   Drive Line:  Existing VAD dressing removed and site care performed using sterile technique. Drive line exit site cleaned with betadine swabs x 2 and rinsed with sterile saline, allowed to dry, and gauze dressing WITHOUT calcium aliginate strip re-applied. No skin prep used to decrease chance of skin irritation.Exit site unincorporated, erythema around exit site persists, small amount yellow drainage with no foul odor noted. Rash improved around drive line, but rash still present along left flank. The velour is fully implanted at exit site. New anchor for sensitive skin applied around drive line. Daily dressing changes per VAD Coordinator, Nurse Davonna Belling, or trained caregiver. Next dressing change due: 07/23/19.       Labs:  LDH trend:  660>398........375>338>320>317>257>257>244>256  INR trend: 1.4>1.3.....1.5>1.3>1.3>1.4>1.8  Anticoagulation Plan: -INR Goal:  2.0 - 2.5 -ASA Dose: 81 mg   Blood Products:  -Intra-op - 06/25/19>>6 units FFP  - 07/09/19>>2 FFP - 07/10/19>>2 FFP  Device: N/A  Drips: Heparin>>500 u/hr  Tube Feeding: stopped 07/14/19  Infection: - 07/09/19 - BCs>>negative - 07/09/19 - resp culture>>negative - 07/21/19 - DL wound culture>>pending  Arrythmias: was on Amiodarone pre implant for NSVT - 2 episodes of VF requiring emergent DC-CV on 07/02/19 - Recurrent VF on 7/2 and broke spontaneously.  - 7/3 Recurrent VF at 2a.-> emergent DC-CV.  - 07/07/2019 VT/Torsades -> repeat DC_CV - 07/08/2019 VF/Torsades x2--> DC-CV x2.  - 07/09/19 Multiple episodes of VT/VF -> IABP placed. AV pacing via external pacing wires - underlying rhythm bradycardia 40 bpm; external AV pacing wires remain intact  Respiratory: extubated 06/26/19 - re-intubated 07/09/19 for sepsis/pneumoni - extubated 07/14/19  Nitric Oxide: off 06/26/19  - Restarted 07/10/19 - Off 07/14/19  VAD Education: 1. Sister-Joanne is checked off to perform driveline dressing changes independently.  2. Discharge education completed 07/21/19 with patient, [name], and Otila Kluver.    Plan/Recommendations:  1. Call VAD pager if any VAD equipment or drive line issues. 2. Daily day dressing changes per VAD Coordinator, Nurse Davonna Belling, or pts sister.  Zada Girt RN [place] Coordinator  Office: [phone]  24/7 Pager: [phone]

## 2019-07-22 NOTE — Progress Notes (Signed)
[place] for Coumadin  Indication: LVAD  Allergies  Allergen Reactions  . Codeine Hives    Patient Measurements: Height: 5\' 9"  (175.3 cm) Weight: 83.6 kg (184 lb 4.9 oz) IBW/kg (Calculated) : 70.7 Heparin Dosing Weight: ~ 92 kg  Vital Signs: Temp: 97.7 F (36.5 C) (07/21 0714) Temp Source: Oral (07/21 0714) BP: 98/88 (07/21 0714) Pulse Rate: 69 (07/21 0714)  Labs: Recent Labs    07/20/19 0440 07/20/19 0440 07/20/19 0450 07/21/19 0430 07/22/19 0420  HGB 11.5*   < >  --  11.7* 12.1*  HCT 37.0*  --   --  37.9* 39.2  PLT 239  --   --  256 268  LABPROT 15.4*  --   --  16.9* 20.2*  INR 1.3*  --   --  1.4* 1.8*  HEPARINUNFRC  --   --  <0.10* <0.10*  --   CREATININE 1.25*  --   --  1.34* 1.26*   < > = values in this interval not displayed.    Estimated Creatinine Clearance: 66.2 mL/min (A) (by C-G formula based on SCr of 1.26 mg/dL (H)).  Assessment: 54 yo male s/p LVAD HM3 placement 6/24.  Pharmacy asked to dose anticoagulation with IV heparin and Coumadin. No overt bleeding or complications noted.    IABP 7/8>7/13   Therapeutic INR 1.8 today after dose increases, - previously sensitive to inc warfarin doses and have been conservative but now patient more stable and has been eating more. He states he will not eat hospital-provided food but will eat food brought from home by family. Hgb stable 10-11, plt 200s. LDH down to 200s. Heparin level remains undetectable on fixed low-dose heparin 500 units/hr - will stop today with INR at goal Pacing wires removed 7/20 Rash on abdomen under DL dressing - does not seem med related and is not spreading - can apply hydrocortisone cream as need  Goal of Therapy:  INR 2-2.5  Monitor platelets by anticoagulation protocol: Yes   Plan:  -Warfarin 5 mg PO x1 today Stop heparin -Daily INR, CBC, LDH, s/sx of bleeding, HL  Bonnita Nasuti Pharm.D. CPP, BCPS Clinical  Pharmacist [phone] 07/22/2019 10:50 AM    Please check [email or web address] for unit-specific pharmacist phone numbers

## 2019-07-22 NOTE — Progress Notes (Addendum)
Mews fired yellow for sbp, RR, and LOC. Patient is alert and oriented x 4. Resting with eyes closed at this time. Awakens when name is called. Patient is currently resting and blood pressures at night runs soft. No acute changes at this time. Will follow vitals per unit routine.   3- MEWS fired yellow due to b/p and respirations. Patient has no acute changes from previous assessment. Alert and oriented x 4. Denis pain. No distress. Will continue to get vitals q 4 hours.

## 2019-07-22 NOTE — Progress Notes (Signed)
Physical Therapy Treatment Patient Details Name: Aaron [name] MRN: [id number] DOB: 12/08/1965 Today's Date: 07/22/2019    History of Present Illness Pt is a 54 y.o. admitted 06/17/19 with STEMI with multivessel disease; cath with no viable targets for CABG. Pt s/p HeartMate 3 LVAD implantation 6/24. VT 2x on 7/1 s/p DCCV. 7/7 pt with torsades. 7/8 pt intubated, bronchoscopy with repeat Vtach s/p cardioversion and IABP placed. IABP removed 7/13 and extubated 7/13. PMH includes HTN, tobacco use (30pack yrs).   PT Comments    Pt progressing with mobility, remains motivated to participate. Today's session focused on gait and balance training without DME as pt independent PTA. Pt requires min-maxA to maintain balance with ambulation and higher level balance tasks. Limited by decreased activity tolerance and impaired balance strategies/postural reactions; at high risk for falls. Pt transitioning LVAD from wall to battery power well at supervision-level. Continue to recommend intensive CIR-level therapies to maximize functional mobility and independence prior to return home.   Follow Up Recommendations  CIR     Equipment Recommendations   (TBD)    Recommendations for Other Services       Precautions / Restrictions Precautions Precautions: Fall;Sternal;Other (comment) Precaution Comments: LVAD    Mobility  Bed Mobility Overal bed mobility: Modified Independent Bed Mobility: Rolling;Sidelying to Sit              Transfers Overall transfer level: Needs assistance Equipment used: 1 person hand held assist Transfers: Sit to/from Stand Sit to Stand: Min guard;Min assist         General transfer comment: Reliant on HHA to maintain balance upon standing  Ambulation/Gait Ambulation/Gait assistance: Min assist;Mod assist;Max assist Gait Distance (Feet): 240 Feet Assistive device: 1 person hand held assist;None Gait Pattern/deviations: Step-through pattern;Decreased stride  length;Staggering left;Staggering right;Trunk flexed Gait velocity: Decreased Gait velocity interpretation: <1.31 ft/sec, indicative of household ambulator General Gait Details: Slow, very unsteady gait without DME, pt requiring intermittent HHA and min-maxA to prevent LOB. 4x standing rest breaks secondary to c/o fatigue and dizziness; pt declining seated rest.   Stairs             Wheelchair Mobility    Modified Rankin (Stroke Patients Only)       Balance Overall balance assessment: Needs assistance Sitting-balance support: No upper extremity supported;Feet supported Sitting balance-Leahy Scale: Good       Standing balance-Leahy Scale: Fair Standing balance comment: Can maintain static standing without UE support; reliant on UE support or external assist for dynamic stability             High level balance activites: Side stepping;Direction changes;Turns;Head turns High Level Balance Comments: Postive LOB with higher level balance tasks requiring min-maxA to correct            Cognition Arousal/Alertness: Awake/alert Behavior During Therapy: WFL for tasks assessed/performed;Flat affect Overall Cognitive Status: Impaired/Different from baseline Area of Impairment: Memory                     Memory: Decreased short-term memory         General Comments: WFL for simple tasks. Able to switch over from wall to battery power at supervision-level; did not remember to bring bag with him, could recall 1/3 things to bring in bag      Exercises      General Comments General comments (skin integrity, edema, etc.): VSS on RA      Pertinent Vitals/Pain Pain Assessment: No/denies pain Pain Intervention(s): Monitored during  session    Home Living                      Prior Function            PT Goals (current goals can now be found in the care plan section) Progress towards PT goals: Progressing toward goals    Frequency    Min  3X/week      PT Plan Current plan remains appropriate    Co-evaluation              AM-PAC PT "6 Clicks" Mobility   Outcome Measure  Help needed turning from your back to your side while in a flat bed without using bedrails?: None Help needed moving from lying on your back to sitting on the side of a flat bed without using bedrails?: None Help needed moving to and from a bed to a chair (including a wheelchair)?: A Little Help needed standing up from a chair using your arms (e.g., wheelchair or bedside chair)?: A Little Help needed to walk in hospital room?: A Lot Help needed climbing 3-5 steps with a railing? : A Lot 6 Click Score: 18    End of Session Equipment Utilized During Treatment: Gait belt Activity Tolerance: Patient tolerated treatment well Patient left: in bed;with call bell/phone within reach;with bed alarm set Nurse Communication: Mobility status PT Visit Diagnosis: Other abnormalities of gait and mobility (R26.89);Muscle weakness (generalized) (M62.81)     Time: 1275-1700 PT Time Calculation (min) (ACUTE ONLY): 21 min  Charges:  $Gait Training: 8-22 mins                    Mabeline Caras, PT, DPT Acute Rehabilitation Services  Pager [phone] Office [place] 07/22/2019, 3:05 PM

## 2019-07-22 NOTE — Progress Notes (Signed)
13 Days Post-Op Procedure(s) (LRB): RIGHT HEART CATH (N/A) IABP Insertion (N/A) Subjective: Breathing comfortably on room air No more ventricular arrhythmias Patient wires removed yesterday, INR 1.8, heparin bridge no longer needed Sternal  incision clean and dry Nonhealing of power cord exit site but Gram stain negative and culture negative to date  Objective: Vital signs in last 24 hours: Temp:  [97.5 F (36.4 C)-98.6 F (37 C)] 97.7 F (36.5 C) (07/21 0714) Pulse Rate:  [64-111] 69 (07/21 0714) Cardiac Rhythm: Normal sinus rhythm;Heart block;Bundle branch block (07/21 0705) Resp:  [12-19] 14 (07/21 0714) BP: (81-106)/(70-92) 98/88 (07/21 0714) SpO2:  [94 %-100 %] 100 % (07/21 0714) Weight:  [83.6 kg] 83.6 kg (07/21 0500)  Hemodynamic parameters for last 24 hours: CVP:  [2 mmHg-10 mmHg] 8 mmHg  Intake/Output from previous day: 07/20 0701 - 07/21 0700 In: 636 [P.O.:600; I.V.:36] Out: 200 [Urine:200] Intake/Output this shift: No intake/output data recorded.   Lungs clear No JVD Incision clean and dry LVAD hum  Lab Results: Recent Labs    07/21/19 0430 07/22/19 0420  WBC 7.2 8.5  HGB 11.7* 12.1*  HCT 37.9* 39.2  PLT 256 268   BMET:  Recent Labs    07/21/19 0430 07/22/19 0420  NA 134* 134*  K 4.4 4.5  CL 94* 93*  CO2 30 30  GLUCOSE 100* 97  BUN 17 18  CREATININE 1.34* 1.26*  CALCIUM 8.9 9.3    PT/INR:  Recent Labs    07/22/19 0420  LABPROT 20.2*  INR 1.8*   ABG    Component Value Date/Time   PHART 7.458 (H) 07/14/2019 0507   HCO3 21.2 07/14/2019 0507   TCO2 22 07/14/2019 0507   ACIDBASEDEF 2.0 07/14/2019 0507   O2SAT 53.4 07/22/2019 0420   CBG (last 3)  Recent Labs    07/21/19 1709 07/21/19 2044 07/22/19 0619  GLUCAP 99 106* 137*    Assessment/Plan: S/P Procedure(s) (LRB): RIGHT HEART CATH (N/A) IABP Insertion (N/A) Continue daily VAD exit site dressing change Does not need antibiotics at this point  LOS: 35 days    Aaron [name] 07/22/2019

## 2019-07-22 NOTE — Progress Notes (Signed)
Vitals per fall: T-97.5 oral B/p-103/91(left arm) MAP-97 HR-76 Doppler pressure- 104 (manual)  Patient had a fall, no noted injuries. Patient alert and oriented x 4 , LVAD coordinator, Jilda Roche notified. No new orders. Will notify family in the AM.

## 2019-07-22 NOTE — Plan of Care (Signed)
  Problem: Activity: Goal: Ability to tolerate increased activity will improve Outcome: Progressing   Problem: Cardiac: Goal: Ability to achieve and maintain adequate cardiopulmonary perfusion will improve Outcome: Progressing   Problem: Education: Goal: Knowledge of General Education information will improve Description: Including pain rating scale, medication(s)/side effects and non-pharmacologic comfort measures Outcome: Progressing   Problem: Clinical Measurements: Goal: Ability to maintain clinical measurements within normal limits will improve Outcome: Progressing Goal: Will remain free from infection Outcome: Progressing Goal: Diagnostic test results will improve Outcome: Progressing Goal: Respiratory complications will improve Outcome: Progressing

## 2019-07-23 LAB — BASIC METABOLIC PANEL
Anion gap: 10 (ref 5–15)
BUN: 15 mg/dL (ref 6–20)
CO2: 27 mmol/L (ref 22–32)
Calcium: 9.1 mg/dL (ref 8.9–10.3)
Chloride: 97 mmol/L — ABNORMAL LOW (ref 98–111)
Creatinine, Ser: 1.18 mg/dL (ref 0.61–1.24)
GFR calc Af Amer: >60 mL/min (ref >60)
GFR calc non Af Amer: >60 mL/min (ref >60)
Glucose, Bld: 105 mg/dL — ABNORMAL HIGH (ref 70–99)
Potassium: 4.2 mmol/L (ref 3.5–5.1)
Sodium: 134 mmol/L — ABNORMAL LOW (ref 135–145)

## 2019-07-23 LAB — GLUCOSE, CAPILLARY
Glucose-Capillary: 96 mg/dL (ref 70–99)
Glucose-Capillary: 117 mg/dL — ABNORMAL HIGH (ref 70–99)
Glucose-Capillary: 108 mg/dL — ABNORMAL HIGH (ref 70–99)
Glucose-Capillary: 99 mg/dL (ref 70–99)

## 2019-07-23 LAB — CBC
HCT: 39.3 % (ref 39.0–52.0)
Hemoglobin: 12.2 g/dL — ABNORMAL LOW (ref 13.0–17.0)
MCH: 29 pg (ref 26.0–34.0)
MCHC: 31 g/dL (ref 30.0–36.0)
MCV: 93.6 fL (ref 80.0–100.0)
Platelets: 285 10*3/uL (ref 150–400)
RBC: 4.2 MIL/uL — ABNORMAL LOW (ref 4.22–5.81)
RDW: 16.3 % — ABNORMAL HIGH (ref 11.5–15.5)
WBC: 8.9 10*3/uL (ref 4.0–10.5)
nRBC: 0 % (ref 0.0–0.2)

## 2019-07-23 LAB — AEROBIC CULTURE W GRAM STAIN (SUPERFICIAL SPECIMEN)
Culture: NO GROWTH
Gram Stain: NONE SEEN

## 2019-07-23 LAB — COOXEMETRY PANEL
Carboxyhemoglobin: 1.8 % — ABNORMAL HIGH (ref 0.5–1.5)
Methemoglobin: 1.1 % (ref 0.0–1.5)
O2 Saturation: 51.8 %
Total hemoglobin: 12.9 g/dL (ref 12.0–16.0)

## 2019-07-23 LAB — PROTIME-INR
INR: 2.5 — ABNORMAL HIGH (ref 0.8–1.2)
Prothrombin Time: 25.9 seconds — ABNORMAL HIGH (ref 11.4–15.2)

## 2019-07-23 LAB — MAGNESIUM: Magnesium: 2 mg/dL (ref 1.7–2.4)

## 2019-07-23 LAB — LACTATE DEHYDROGENASE: LDH: 233 U/L — ABNORMAL HIGH (ref 98–192)

## 2019-07-23 LAB — BRAIN NATRIURETIC PEPTIDE: B Natriuretic Peptide: 1811.9 pg/mL — ABNORMAL HIGH (ref 0.0–100.0)

## 2019-07-23 MED ORDER — AMIODARONE HCL 400 MG PO TABS: 400.0000 mg | ORAL_TABLET | Freq: Two times a day (BID) | ORAL | Status: AC

## 2019-07-23 MED ORDER — RANOLAZINE ER 500 MG PO TB12: 500.0000 mg | ORAL_TABLET | Freq: Two times a day (BID) | ORAL | Status: AC

## 2019-07-23 MED ORDER — MAGNESIUM OXIDE 400 (241.3 MG) MG PO TABS: 400.0000 mg | ORAL_TABLET | Freq: Every day | ORAL | Status: DC

## 2019-07-23 MED ORDER — HYDROCORTISONE 1 % EX CREA: TOPICAL_CREAM | Freq: Three times a day (TID) | CUTANEOUS | 0 refills | Status: DC

## 2019-07-23 MED ORDER — PANTOPRAZOLE SODIUM 40 MG PO TBEC: 40.0000 mg | DELAYED_RELEASE_TABLET | Freq: Every day | ORAL | Status: AC

## 2019-07-23 MED ORDER — WARFARIN SODIUM 1 MG PO TABS: 1.0000 mg | ORAL_TABLET | Freq: Once | ORAL | Status: DC

## 2019-07-23 MED ORDER — SODIUM CHLORIDE 0.9 % IV BOLUS
500.0000 mL | Freq: Once | INTRAVENOUS | Status: AC
  Administered 2019-07-23: 500 mL via INTRAVENOUS

## 2019-07-23 MED ORDER — THIAMINE HCL 100 MG PO TABS: 100.0000 mg | ORAL_TABLET | Freq: Every day | ORAL | Status: AC

## 2019-07-23 MED ORDER — ASPIRIN 81 MG PO CHEW: 81.0000 mg | CHEWABLE_TABLET | Freq: Every day | ORAL | Status: AC

## 2019-07-23 MED ORDER — ATORVASTATIN CALCIUM 80 MG PO TABS: 80.0000 mg | ORAL_TABLET | Freq: Every day | ORAL | Status: AC

## 2019-07-23 MED ORDER — WARFARIN SODIUM 1 MG PO TABS
1.0000 mg | ORAL_TABLET | Freq: Once | ORAL | Status: DC
  Administered 2019-07-23: 1 mg via ORAL
  Filled 2019-07-23: qty 1

## 2019-07-23 MED ORDER — FOLIC ACID 1 MG PO TABS: 1.0000 mg | ORAL_TABLET | Freq: Every day | ORAL | Status: AC

## 2019-07-23 MED ORDER — DIGOXIN 125 MCG PO TABS: 0.1250 mg | ORAL_TABLET | Freq: Every day | ORAL | Status: AC

## 2019-07-23 MED ORDER — GABAPENTIN 600 MG PO TABS: 300.0000 mg | ORAL_TABLET | Freq: Three times a day (TID) | ORAL | Status: AC

## 2019-07-23 NOTE — Progress Notes (Addendum)
Patient ID: FAVOR HACKLER, male   DOB: [birth year], 54 y.o.   MRN: [id number]     Advanced Heart Failure Rounding Note  PCP-Cardiologist: No primary care provider on file.   Subjective:    - HM3 placement 6/24  - Had 2 episodes of VF requiring emergent DC-CV on 07/02/19 - Recurrent VF on 7/2 and broke spontaneously.  - 7/3 Recurrent VF at 2a.-> emergent DC-CV.  - 07/07/2019 VT/Torsades -> repeat DC_CV - 07/08/2019 VF/Torsades x2--> DC-CV x2.  - 07/09/19 developed sepsis/PNA -> intubated - 07/09/19 Multiple episodes of VT/VF -> IABP placed. AV pacing via EPW - 07/14/19 IABP removed and extubated. Pacing discontinued.  Evaluated yesterday and felt to be volume deplete. Torsemide discontinued. 1L Fluid bolus given.  Wt unchanged at 184 lb. MAPs 80s-low 90s. SCr improved w/ hydration, down from 1.34>>1.26>>1.18.   Doing ok today. OOB sitting in chair. Worked w/ PT yesterday. Able to walk w/o walker. Still feels lightheaded w/ standing. No further falls.   INR 2.5. Hgb stable at 12. LDH 233.   Awaiting CIR bed.   LVAD Interrogation HM III: Speed: 5200 Flow: 4.0 PI: 5.2 Power: 3.7.  3 PI events VAD interrogated personally. Parameters stable.   Objective:   Weight Range: 83.6 kg Body mass index is 27.22 kg/m.   Vital Signs:   Temp:  [97.6 F (36.4 C)-98.5 F (36.9 C)] 98.5 F (36.9 C) (07/22 0353) Pulse Rate:  [60-77] 77 (07/22 0355) Resp:  [14-20] 14 (07/22 0353) BP: (91-109)/(62-82) 109/82 (07/22 0353) SpO2:  [93 %-99 %] 99 % (07/22 0353) Weight:  [83.6 kg] 83.6 kg (07/22 0236) Last BM Date: 07/21/19  Weight change: Filed Weights   07/21/19 0400 07/22/19 0500 07/23/19 0236  Weight: 85 kg 83.6 kg 83.6 kg    Intake/Output:   Intake/Output Summary (Last 24 hours) at 07/23/2019 0744 Last data filed at 07/23/2019 0300 Gross per 24 hour  Intake 273 ml  Output 100 ml  Net 173 ml      Physical Exam   General:  Well appearing WM. OOB sitting up in chair. No distress HEENT:  normal  Neck: supple. JVP not elevated.  Carotids 2+ bilat; no bruits. No lymphadenopathy or thryomegaly appreciated. Cor: +LVAD hum.  Lungs: CTAB Abdomen: obese soft, nontender, non-distended. No hepatosplenomegaly. No bruits or masses. Good bowel sounds. Driveline site clean. Anchor in place.  Extremities: no cyanosis, clubbing, rash. No LEE Neuro: alert & oriented x 3. No focal deficits. Moves all 4 without problem   Telemetry   NSR 80s. No VT  Personally reviewed    Labs    CBC Recent Labs    07/22/19 0420 07/23/19 0243  WBC 8.5 8.9  HGB 12.1* 12.2*  HCT 39.2 39.3  MCV 93.6 93.6  PLT 268 021   Basic Metabolic Panel Recent Labs    07/22/19 0420 07/23/19 0243  NA 134* 134*  K 4.5 4.2  CL 93* 97*  CO2 30 27  GLUCOSE 97 105*  BUN 18 15  CREATININE 1.26* 1.18  CALCIUM 9.3 9.1  MG 1.9 2.0   Liver Function Tests No results for input(s): AST, ALT, ALKPHOS, BILITOT, PROT, ALBUMIN in the last 72 hours. No results for input(s): LIPASE, AMYLASE in the last 72 hours. Cardiac Enzymes No results for input(s): CKTOTAL, CKMB, CKMBINDEX, TROPONINI in the last 72 hours.  BNP: BNP (last 3 results) Recent Labs    07/09/19 0313 07/16/19 0346 07/23/19 0243  BNP 1,815.2* 3,431.7* 1,811.9*    ProBNP (  last 3 results) No results for input(s): PROBNP in the last 8760 hours.   D-Dimer No results for input(s): DDIMER in the last 72 hours. Hemoglobin A1C No results for input(s): HGBA1C in the last 72 hours. Fasting Lipid Panel No results for input(s): CHOL, HDL, LDLCALC, TRIG, CHOLHDL, LDLDIRECT in the last 72 hours. Thyroid Function Tests No results for input(s): TSH, T4TOTAL, T3FREE, THYROIDAB in the last 72 hours.  Invalid input(s): FREET3  Other results:   Imaging    No results found.   Medications:     Scheduled Medications: . amiodarone  400 mg Oral BID  . aspirin  81 mg Oral Daily  . atorvastatin  80 mg Oral Daily  . bisacodyl  10 mg Oral Daily    Or  . bisacodyl  10 mg Rectal Daily  . chlorhexidine  15 mL Mouth Rinse BID  . Chlorhexidine Gluconate Cloth  6 each Topical Daily  . clonazePAM  0.5 mg Oral BID  . colchicine  0.6 mg Oral BID  . digoxin  0.125 mg Oral Daily  . docusate sodium  100 mg Oral Daily  . feeding supplement (ENSURE ENLIVE)  237 mL Oral BID BM  . folic acid  1 mg Oral Daily  . gabapentin  300 mg Oral TID  . hydrocortisone cream   Topical TID  . insulin aspart  0-5 Units Subcutaneous QHS  . insulin aspart  0-9 Units Subcutaneous TID WC  . magnesium oxide  400 mg Oral Daily  . mouth rinse  15 mL Mouth Rinse q12n4p  . melatonin  5 mg Oral QPC supper  . multivitamin with minerals  1 tablet Oral Daily  . pantoprazole  40 mg Oral Daily  . polyethylene glycol  17 g Oral Daily  . ranolazine  500 mg Oral BID  . sodium chloride flush  10-40 mL Intracatheter Q12H  . sodium chloride flush  3 mL Intravenous Q12H  . sodium chloride flush  3 mL Intravenous Q12H  . thiamine  100 mg Oral Daily  . Warfarin - Pharmacist Dosing Inpatient   Does not apply q1600    Infusions: . sodium chloride    . sodium chloride Stopped (07/15/19 0129)  . lactated ringers      PRN Medications: sodium chloride, acetaminophen, ALPRAZolam, bisacodyl, calcium carbonate, dextrose, fentaNYL (SUBLIMAZE) injection, ondansetron (ZOFRAN) IV, oxyCODONE, pneumococcal 23 valent vaccine, polyvinyl alcohol, sodium chloride flush, sodium chloride flush, traMADol, traZODone    Patient Profile   Javaris Wigington [place]hart --post MI cardiogenic shock/ optimization prior to anticipated CABG, at the request of Dr. Marlou Porch, Cardiology.    Assessment/Plan   1. Refractory VT/VF  - developed VF on 7/1 x 2 requiring emergent DC-CV - On 7/2 had self-terminating episode - On 7/3 persistent VF/torsades -> DC-CV - On 7/6  VF/torsades.  - On 7/7 VF /Torsades on 2 separate occasions-> DC-CV x2.  - On 7/8 multiple episodes on VT/VF (incessant) -> unclear which  intervention broke it   - Now off mexiletine and on amiodarone 400 mg bid.  - Keep K> 4.0 Mg >2.0 (4.2 and 2.0 today).  - VAD speed initially reduced to 5000 but output low. Now at 5200.  - Keep off inotropes with VT.  - No ventricular arrhythmias on telemetry - I have d/w EP regarding role of ICD for him. They will discuss.   2. Acute Anterolateral STEMI/ Multivessel CAD - Emergent cath showed severe multivessel disease w/ 75% mid- distal LM, 100% prox LAD, not amendable  to PCI (unable to cross w/ guide wire, probable CTO), 100% prox LCx (probable CTO) and 100% ostial RCA (probable CTO). There were mild to moderate collaterals right to left to the LAD, minimal right to right collaterals an minimal left to left collaterals. LVEF 25% - Hs trop peaked 27,000. - No targets for CABG / PCI.   - S/P HMIII VAD - suspect VF was ischemically-mediated - No s/s ischemia currently  3.  Acute Systolic Heart Failure --->Cardiogenic Shock - 2/2 acute MI. LVEF by LVG 25% - IABP out 6/18 but replaced 6/23. Removed in OR on 6/24  - HMIII LVAD  Placed 6/24 - Extubated 6/25  - Was on milrinone for RV failure. Now off due to VF.  - IABP removed 7/13 - CO-OX marginal at 52% - Volume status ok but a bit dry. Off torsemide. IVFs given yesterday. Continue to hold diuretics. - Midodrine discontinued, MAPs 80-90s - Continue digoxin for RV support.   - LDH  Peaked  2600 but now down to 233. No evidence pump thrombosis  - Ramp echo 7/8 RV only mildly reduced. Good cannula position. Increase VAD speed as tolerated, currently 5200.  Would repeat ramp echo prior to discharge (early this week) now that ventricular arrhythmias have calmed, may be able to increase  speed further.  - INR 2.5. Continue coumadin. Discussed dosing with PharmD personally. - has mild superficial driveline irritation. Continue to follow.   4. Acute hypoxic respiratory failure - intubated 7/8. Extubated 7/13 - Chest CT with bilateral PNA  R>L - completed abx therapy w/ vanc/cefipime - PCT 1.97 => 1.3 => 0.6.  - BCx and BAL NGTD - Currently AF w/ normal WBC ct  - No change  5. Shock liver - Resolved.  6. Tobacco Abuse - 30 pack years  - smoking cessation advised  - Will need to quit if to be considered for heart transplant in the future   7. Renal mass = RCC - MRI abdomen- 1.8 cm right renal lesion, most consistent with renal cell carcinoma. No evidence of renal vein involvement or nodal metastasis. - Discussed w/ Urology and R RCC is small and will just require surveillance.  Ok to proceed with VAD from their standpoint.   8. Hyponatremia  - 134 today  9. Sleep Apnea - frequent apnea at night  - needs formal sleep study   - CPAP ordered while inpatient  10. Anemia - Post-op, increasing hgb 12.2 today  11. Debility - continue to mobilize w/ PT, walking more.  - CIR recommended. - stable for d/c to CIR from cardiac standpoint. Discussed w/ CIR again today. Awaiting bed.      Nelida Gores  07/23/2019 7:44 AM  Patient seen and examined with the above-signed Advanced Practice Provider and/or Housestaff. I personally reviewed laboratory data, imaging studies and relevant notes. I independently examined the patient and formulated the important aspects of the plan. I have edited the note to reflect any of my changes or salient points. I have personally discussed the plan with the patient and/or family.  Feels better today but still a bit lightheaded when standing. MAPs ok. Able to ambulate hall. No further VT.   General:  NAD.  HEENT: normal  Neck: supple. JVP not elevated.  Carotids 2+ bilat; no bruits. No lymphadenopathy or thryomegaly appreciated. Cor: LVAD hum.  Lungs: Clear. Abdomen: obese soft, nontender, non-distended. No hepatosplenomegaly. No bruits or masses. Good bowel sounds. Driveline site clean. Anchor in place.  Extremities: no  cyanosis, clubbing, rash. Warm no edema  Neuro: alert  & oriented x 3. No focal deficits. Moves all 4 without problem   Volume status still a bit on the dry side but BNP remain markedly elevated. Will follow. INR 2.5. Discussed dosing with PharmD personally. LDH ok. No further VT. Stable for CIR when bed available.   Glori Bickers, MD  8:13 AM

## 2019-07-23 NOTE — TOC Transition Note (Signed)
Transition of Care [facility]) - CM/SW Discharge Note   Patient Details  Name: Aaron [name] MRN: [id number] Date of Birth: 1965/11/15  [facility] CM/SW Contact:  Zenon Mayo, RN Phone Number: 07/23/2019, 11:26 AM   Clinical Narrative:    Patient is for dc to CIR today.   Final next level of care: IP Rehab Facility Barriers to Discharge: No Barriers Identified   Patient Goals and CMS Choice        Discharge Placement                       Discharge Plan and Services                  DME Agency: NA                  Social Determinants of Health (SDOH) Interventions     Readmission Risk Interventions No flowsheet data found.

## 2019-07-23 NOTE — Progress Notes (Signed)
Inpatient Rehabilitation-Admissions Coordinator   Received medical clearance from HF team for admit to CIR today. Met with pt bedside to notify him of insurance approval and bed offer. Pt has accepted. We reviewed insurance benefits letter and consent forms. All questions answered. RN and [facility] team notified of plan for today.   Please call if questions.   Raechel Ache, OTR/L  Rehab Admissions Coordinator  [phone] 07/23/2019 11:27 AM

## 2019-07-23 NOTE — Progress Notes (Signed)
RN called to patients room, patient was vomiting, IV zofran given.

## 2019-07-23 NOTE — Progress Notes (Signed)
Aaron [name] for Coumadin  Indication: LVAD  Allergies  Allergen Reactions  . Codeine Hives    Patient Measurements: Height: 5\' 9"  (175.3 cm) Weight: 83.6 kg (184 lb 4.9 oz) IBW/kg (Calculated) : 70.7 Heparin Dosing Weight: ~ 92 kg  Vital Signs: Temp: 98.5 F (36.9 C) (07/22 0353) Temp Source: Oral (07/22 0353) BP: 113/86 (07/22 0807) Pulse Rate: 100 (07/22 0807)  Labs: Recent Labs    07/21/19 0430 07/21/19 0430 07/22/19 0420 07/23/19 0243  HGB 11.7*   < > 12.1* 12.2*  HCT 37.9*  --  39.2 39.3  PLT 256  --  268 285  LABPROT 16.9*  --  20.2* 25.9*  INR 1.4*  --  1.8* 2.5*  HEPARINUNFRC <0.10*  --   --   --   CREATININE 1.34*  --  1.26* 1.18   < > = values in this interval not displayed.    Estimated Creatinine Clearance: 70.7 mL/min (by C-G formula based on SCr of 1.18 mg/dL).  Assessment: 54 yo male s/p LVAD HM3 placement 6/24.  Pharmacy asked to dose anticoagulation with IV heparin and Coumadin. No overt bleeding or complications noted.    IABP 7/8>7/13   Therapeutic INR 1.8>2.5 today after dose increases, - previously sensitive to inc warfarin doses and have been conservative, now patient more stable and has been eating more. He states he will not eat hospital-provided food but will eat food brought from home by family. Hgb stable 10-11, plt 200s. LDH down to 200s. Pacing wires removed 7/20, heparin stopped 7/21 with INR at goal Rash on abdomen under DL dressing - improved with hydrocortisone cream as need  Goal of Therapy:  INR 2-2.5  Monitor platelets by anticoagulation protocol: Yes   Plan:  -Warfarin 1mg  PO x1 today -Daily INR, CBC, LDH, s/sx of bleeding, HL  Bonnita Nasuti Pharm.D. CPP, BCPS Clinical Pharmacist [phone] 07/23/2019 10:14 AM    Please check [email or web address] for unit-specific pharmacist phone numbers

## 2019-07-23 NOTE — Progress Notes (Signed)
Patient ID: Aaron [name], male   DOB: [birth year], 54 y.o.   MRN: [id number]    This NP visited patient at the bedside as a follow up for palliative medicine needs and emotional support.   Patient is s/p HM3 placement 6/24.    He is alert and oriented and verbalizes " I feel strange, just like I did before they had to shock me". Bedside RN notified, she is to bedside along with [name]/LVAD coordinator offering assurance that everything is stable.  Emotional support offered.  Joe and I explored the relationship of mind, body and spirit. He recognizes his  anxiety is related to the as we talked about his new normal.   We talked about the impact of mind body and spirit.  Education offered on Relaxation breathing and mindfulness techniques to reduce anxiety. Demonstration of relaxation breathing, patient demonstrates back along with gentle hand and arm massage. Reports that these techniques seem to help him.  He is weak but continues to improve physically and functionally, plan is for CIR for continued rehabilitation.   PMT will continue to support holistically  Total time spent on the unit was 25 minutes.  Greater than 50% of the time was spent in counseling and coordination of care  Wadie Lessen NP  Palliative Medicine Team Team Phone # [phone] Pager [phone]

## 2019-07-23 NOTE — Progress Notes (Signed)
Patient refused CPAP for tonight 

## 2019-07-23 NOTE — Progress Notes (Signed)
Inpatient Rehabilitation-Admissions Coordinator ]  During PM&R MD pre-admission assessment, the pt began vomiting and was felt to be not ready for CIR. Admitting PM&R MD would like to hold on admit today and we can reassess for potential admit tomorrow. HF team notified via secure chat; RN notified. PM&R team notified of cancelled admission.   AC will follow up tomorrow.   Raechel Ache, OTR/L  Rehab Admissions Coordinator  [phone] 07/23/2019 1:36 PM

## 2019-07-23 NOTE — Progress Notes (Signed)
LVAD Coordinator Rounding Note:  Transferred from [facility] ED on 06/17/19 with STEMI. Cath revealed multi-vessel CAD, wire would not cross to open LAD; IABP placed and pt transferred here for evaluation for CABG.   Dr. Orvan Seen evaluated and found he was not amenable to surgical or percutaneous revascularization due to poor targets. With severe depressed LV function and little coronary perfusion, LVAD was recommended.   HM III LVAD implanted on 06/25/19 by Dr. Orvan Seen under Destination Therapy criteria due to recent smoking history.  Developed sepsis/PNA with respiratory failure. Had refractory VT/VF. IABP/swan placed 07/09/19 - discontinued 07/14/19  Pt sitting up in bed, anxious about feeling dizzy. Wadie Lessen, NP at bedside talking with patient. He says he had this feeling prior to getting shocked and that is what is causing his anxiety. Pt just received 500 NS bolus. Dr. Haroldine Laws updated re: dizziness, BP 94/76 (83) after bolus. Dr. Haroldine Laws asked for orthostatic v/s; BS nurse will check orthostatics and update Dr. Haroldine Laws.    CIR PA in room, informed patient a CIR bed is available for him today.   Vital signs: Temp: 98.6 HR: 78 SR  Auto BP:  113/86 (94) Doppler:  74 O2 Sat: 99% on RA Wt:207>225.7>222.6>214.3.....193.7>187.3>184.3>184.3>184.3 lbs  LVAD parameters: Speed:  5200 Flow: 4.2 Power:  3.7w PI: 3.7 Hct: 39  Alarms: none Events: rare PI   Fixed speed: 5200 Low speed limit: 4900   Drive Line:  Existing VAD dressing removed and site care performed using sterile technique. Drive line exit site cleaned with betadine swabs x 2 and rinsed with sterile saline, allowed to dry, and gauze dressing WITHOUT calcium aliginate strip re-applied. No skin prep used to decrease chance of skin irritation.Exit site unincorporated, erythema around exit site persists, small amount yellow drainage with no foul odor noted. Redness improved around drive line and rash along left flank  much improved. The velour is fully implanted at exit site. New anchor for sensitive skin applied around drive line. Daily dressing changes per VAD Coordinator, Nurse Davonna Belling, or trained caregiver. Next dressing change due: 07/24/19.   Labs:  LDH trend: 660>398........[phone]  INR trend: 1.4>1.3.....1.5>1.3>1.3>1.4>1.8>2.5  Anticoagulation Plan: -INR Goal:  2.0 - 2.5 -ASA Dose: 81 mg   Blood Products:  -Intra-op - 06/25/19>>6 units FFP  - 07/09/19>>2 FFP - 07/10/19>>2 FFP  Device: N/A  Drips: None  Infection: - 07/09/19 - BCs>>negative - 07/09/19 - resp culture>>negative - 07/21/19 - DL wound culture>>pending  Arrythmias: was on Amiodarone pre implant for NSVT - 2 episodes of VF requiring emergent DC-CV on 07/02/19 - Recurrent VF on 7/2 and broke spontaneously.  - 7/3 Recurrent VF at 2a.-> emergent DC-CV.  - 07/07/2019 VT/Torsades -> repeat DC_CV - 07/08/2019 VF/Torsades x2--> DC-CV x2.  - 07/09/19 Multiple episodes of VT/VF -> IABP placed. AV pacing via external pacing wires - underlying rhythm bradycardia 40 bpm; external AV pacing wires remain intact  Respiratory: extubated 06/26/19 - re-intubated 07/09/19 for sepsis/pneumoni - extubated 07/14/19  Nitric Oxide: off 06/26/19  - Restarted 07/10/19 - Off 07/14/19  VAD Education: 1. Sister-Joanne is checked off to perform driveline dressing changes independently.  2. Discharge education completed 07/21/19 with patient, [name], and Otila Kluver.    Plan/Recommendations:  1. Call VAD pager if any VAD equipment or drive line issues. 2. Daily day dressing changes per VAD Coordinator, Nurse Davonna Belling, or pts sister.  Zada Girt RN [place] Coordinator  Office: [phone]  24/7 Pager: [phone]

## 2019-07-23 NOTE — H&P (Signed)
Physical Medicine and Rehabilitation Admission H&P     HPI: Aaron [name] is a 54 year old right-handed male with history of hypertension and tobacco abuse systolic congestive heart failure who is on no prescription medications.  History taken from chart review and patient.  Per chart review patient lives alone independent prior to admission 1 level home 3 steps to entry.  He does have family in the area.  He presented on 06/17/2019 to OSH, [facility] with acute onset of chest pain.  EKG in the ED showed normal sinus rhythm with a rate of 96 bpm ST elevation in V1-V3, aVR.  ST depressions in inferior lateral leads and Q waves in V1-V3 for diagnosis of STEMI and was taken to the cardiac Cath Lab found to have multilevel CAD.  Attempts were made to open the LAD but the wire would not cross.  Echocardiogram with ejection fraction of 25%.  Patient was transferred to [facility].  Patient underwent LVAD implantation 06/25/2019 per Dr. Orvan Seen.  Hospital course complicated by 2 episodes of ventricular fibrillation requiring emergent DC-CV on 07/02/2019 with recurrent VF on 07/03/2019 and broke spontaneously.  Hospital course further complicated by sepsis on 07/09/2019 due to pneumonia requiring intubation for a short time and extubated 07/14/2019.  Follow-up cardiology services for systolic congestive heart failure requiring midodrine.  Chronic Coumadin has been initiated.  During hospital course findings incidental of renal mass on MRI of abdomen 1.8 cm right renal lesion consistent with renal cell carcinoma.  No evidence of renal vein involvement or nodal metastasis.  Discussed with urology plan at this time requiring surveillance and would follow-up as outpatient.  Palliative care has been consulted to establish goals of care.  Patient did have an episode of vomiting on 07/23/2019 improved with the use of Zofran.  He is tolerating a regular consistency diet.  Therapy evaluations completed and patient  was admitted for a comprehensive rehab program.  Please see preadmission assessment from earlier today as well.  Review of Systems  Constitutional: Positive for malaise/fatigue. Negative for chills and fever.  HENT: Negative for hearing loss.   Eyes: Negative for blurred vision and double vision.  Respiratory: Positive for shortness of breath. Negative for cough.   Cardiovascular: Positive for leg swelling.  Gastrointestinal: Positive for constipation and nausea. Negative for heartburn and vomiting.  Genitourinary: Negative for dysuria, flank pain and hematuria.  Musculoskeletal: Positive for joint pain and myalgias.  Skin: Negative for rash.  Neurological: Positive for weakness.  All other systems reviewed and are negative.  Past Medical History:  Diagnosis Date  . Acute anterolateral wall MI ([place])   . CAD, multiple vessel   . Hypertension   . Ischemic cardiomyopathy   . Systolic heart failure ([place])   . Tobacco abuse    Past Surgical History:  Procedure Laterality Date  . CARDIOVERSION N/A 07/02/2019   Procedure: CARDIOVERSION;  Surgeon: Jolaine Artist, MD;  Location: [facility] OR;  Service: Cardiovascular;  Laterality: N/A;  . CORONARY/GRAFT ACUTE MI REVASCULARIZATION N/A 06/17/2019   Procedure: Coronary/Graft Acute MI Revascularization;  Surgeon: Yolonda Kida, MD;  Location: [place] CV LAB;  Service: Cardiovascular;  Laterality: N/A;  . IABP INSERTION N/A 06/17/2019   Procedure: IABP Insertion;  Surgeon: Yolonda Kida, MD;  Location: [place] CV LAB;  Service: Cardiovascular;  Laterality: N/A;  . IABP INSERTION N/A 06/24/2019   Procedure: IABP INSERTION;  Surgeon: Jolaine Artist, MD;  Location: [place] [facility] CV LAB;  Service: Cardiovascular;  Laterality: N/A;  . IABP INSERTION N/A 07/09/2019   Procedure: IABP Insertion;  Surgeon: Jolaine Artist, MD;  Location: [place] CV LAB;  Service: Cardiovascular;  Laterality: N/A;  . INSERTION OF IMPLANTABLE LEFT  VENTRICULAR ASSIST DEVICE N/A 06/25/2019   Procedure: INSERTION OF IMPLANTABLE LEFT VENTRICULAR ASSIST DEVICE USING THORATEC HEARTMATE 3;  Surgeon: Wonda Olds, MD;  Location: [place];  Service: Open Heart Surgery;  Laterality: N/A;  . LEFT HEART CATH AND CORONARY ANGIOGRAPHY N/A 06/17/2019   Procedure: LEFT HEART CATH AND CORONARY ANGIOGRAPHY;  Surgeon: Yolonda Kida, MD;  Location: [place] CV LAB;  Service: Cardiovascular;  Laterality: N/A;  . RIGHT HEART CATH N/A 06/17/2019   Procedure: RIGHT HEART CATH;  Surgeon: Yolonda Kida, MD;  Location: [place] CV LAB;  Service: Cardiovascular;  Laterality: N/A;  . RIGHT HEART CATH N/A 06/24/2019   Procedure: RIGHT HEART CATH;  Surgeon: Jolaine Artist, MD;  Location: [place] [facility] LAB;  Service: Cardiovascular;  Laterality: N/A;  . RIGHT HEART CATH N/A 07/09/2019   Procedure: RIGHT HEART CATH;  Surgeon: Jolaine Artist, MD;  Location: [place] CV LAB;  Service: Cardiovascular;  Laterality: N/A;  . TEE WITHOUT CARDIOVERSION N/A 06/25/2019   Procedure: TRANSESOPHAGEAL ECHOCARDIOGRAM (TEE);  Surgeon: Wonda Olds, MD;  Location: [place];  Service: Open Heart Surgery;  Laterality: N/A;   Family History  Problem Relation Age of Onset  . CAD Father   . Valvular heart disease Father    Social History:  reports that he has been smoking. He has a 45.00 pack-year smoking history. He has never used smokeless tobacco. He reports current alcohol use of about 7.0 - 14.0 standard drinks of alcohol per week. He reports that he does not use drugs. Allergies:  Allergies  Allergen Reactions  . Codeine Hives   Medications Prior to Admission  Medication Sig Dispense Refill  . amiodarone (PACERONE) 400 MG tablet Take 1 tablet (400 mg total) by mouth 2 (two) times daily.    Marland Kitchen aspirin 81 MG chewable tablet Chew 1 tablet (81 mg total) by mouth daily.    Marland Kitchen atorvastatin (LIPITOR) 80 MG tablet Take 1 tablet (80 mg total) by mouth daily.     . digoxin (LANOXIN) 0.125 MG tablet Take 1 tablet (0.125 mg total) by mouth daily.    . folic acid (FOLVITE) 1 MG tablet Take 1 tablet (1 mg total) by mouth daily.    Marland Kitchen gabapentin (NEURONTIN) 600 MG tablet Take 0.5 tablets (300 mg total) by mouth 3 (three) times daily.    . hydrocortisone cream 1 % Apply topically 3 (three) times daily. 30 g 0  . magnesium oxide (MAG-OX) 400 (241.3 Mg) MG tablet Take 1 tablet (400 mg total) by mouth daily.    . pantoprazole (PROTONIX) 40 MG tablet Take 1 tablet (40 mg total) by mouth daily.    . ranolazine (RANEXA) 500 MG 12 hr tablet Take 1 tablet (500 mg total) by mouth 2 (two) times daily.    . sildenafil (REVATIO) 20 MG tablet Take 1 tablet (20 mg total) by mouth 3 (three) times daily. 10 tablet 0  . thiamine 100 MG tablet Take 1 tablet (100 mg total) by mouth daily.    Marland Kitchen warfarin (COUMADIN) 1 MG tablet Take 1 tablet (1 mg total) by mouth one time only at 4 PM.      Drug Regimen Review Drug regimen was reviewed and remains appropriate with no significant issues identified  Home: Home Living Family/patient expects to be discharged to:: Private residence Living Arrangements: Alone Available Help at Discharge: Family, Available 24 hours/day Type of Home: House Home Access: Stairs to enter [employer] of Steps: 3 Entrance Stairs-Rails: Right, Left, Can reach both Home Layout: One level Bathroom Shower/Tub: Tub/shower unit, Architectural technologist: Standard Bathroom Accessibility: No Home Equipment: None   Functional History: Prior Function Level of Independence: Independent Comments: works in Education officer, museum; occasionally wears glasses for reading; has a Insurance claims handler; enjoys Sport and exercise psychologist Status:  Mobility: Bed Mobility Overal bed mobility: Modified Independent Bed Mobility: Rolling, Sidelying to Sit Rolling: Min guard Sidelying to sit: Min guard Supine to sit: Min assist, HOB elevated Sit to supine: Min  guard General bed mobility comments: to return to supine Transfers Overall transfer level: Needs assistance Equipment used: Rolling walker (2 wheeled) Transfers: Sit to/from Stand Sit to Stand: Supervision Stand pivot transfers: Min assist General transfer comment: cues for hand placement Ambulation/Gait Ambulation/Gait assistance: Min assist, Mod assist, Max assist Gait Distance (Feet): 240 Feet Assistive device: 1 person hand held assist, None Gait Pattern/deviations: Step-through pattern, Decreased stride length, Staggering left, Staggering right, Trunk flexed General Gait Details: Slow, very unsteady gait without DME, pt requiring intermittent HHA and min-maxA to prevent LOB. 4x standing rest breaks secondary to c/o fatigue and dizziness; pt declining seated rest. Gait velocity: Decreased Gait velocity interpretation: <1.31 ft/sec, indicative of household ambulator    ADL: ADL Overall ADL's : Needs assistance/impaired Eating/Feeding: Set up, Sitting Eating/Feeding Details (indicate cue type and reason): setup with lunch tray end of session Grooming: Oral care, Standing, Supervision/safety, Wash/dry hands, Wash/dry face, Applying deodorant, Sitting, Set up Grooming Details (indicate cue type and reason): oral care standing, face/hands/combing hair in chair Upper Body Bathing: Minimal assistance, Sitting Upper Body Bathing Details (indicate cue type and reason): assisted with back Lower Body Bathing: Supervison/ safety, Sit to/from stand Upper Body Dressing : Minimal assistance, Sitting Upper Body Dressing Details (indicate cue type and reason): assisted for LVAD vest Lower Body Dressing: Set up, Sitting/lateral leans Lower Body Dressing Details (indicate cue type and reason): donning socks Toilet Transfer: Minimal assistance, Ambulation, Comfort height toilet Toilet Transfer Details (indicate cue type and reason): pushing IV pole Toileting- Clothing Manipulation and Hygiene:  Supervision/safety, Sit to/from stand Functional mobility during ADLs: Supervision/safety, Rolling walker General ADL Comments: pt tolerating OOB to chair s/p extubation on 7/13 and removal of IABP. pt now with increased weakness and unsteadiness with mobility given recent medical changes   Cognition: Cognition Overall Cognitive Status: Impaired/Different from baseline Orientation Level: Oriented X4 Cognition Arousal/Alertness: Awake/alert Behavior During Therapy: WFL for tasks assessed/performed, Flat affect Overall Cognitive Status: Impaired/Different from baseline Area of Impairment: Problem solving, Memory Memory: Decreased recall of precautions Problem Solving: Difficulty sequencing (some difficulty with managing LVAD equipment) General Comments: pt needing assist and verbal cues to switch from battery back to wall power  Physical Exam: Blood pressure 104/84, pulse 70, temperature 97.6 F (36.4 C), temperature source Oral, resp. rate 20, height _0  (1.753 m), weight 83.2 kg, SpO2 97 %. Physical Exam Vitals reviewed.  Constitutional:      General: He is not in acute distress.    Appearance: He is normal weight.  HENT:     Head: Normocephalic and atraumatic.     Right Ear: External ear normal.     Left Ear: External ear normal.     Nose: Nose normal.  Eyes:     General:  Right eye: No discharge.        Left eye: No discharge.     Extraocular Movements: Extraocular movements intact.  Cardiovascular:     Rate and Rhythm: Normal rate and regular rhythm.     Comments: + LVAD. Pulmonary:     Effort: Pulmonary effort is normal. No respiratory distress.     Breath sounds: Normal breath sounds. No stridor.  Abdominal:     General: Bowel sounds are normal.     Palpations: Abdomen is soft.  Musculoskeletal:     Cervical back: Normal range of motion and neck supple.     Comments: No edema or tenderness in extremities  Skin:    General: Skin is warm and dry.      Comments: Midline chest incision clean and dry  Neurological:     Mental Status: He is alert.     Comments: Patient is alert.   Oriented x3 and follows commands Motor: 4+/5 grossly throughout  Psychiatric:        Mood and Affect: Affect is flat.        Behavior: Behavior normal.     Results for orders placed or performed during the hospital encounter of 06/17/19 (from the past 48 hour(s))  Glucose, capillary     Status: Abnormal   Collection Time: 07/22/19  4:43 PM  Result Value Ref Range   Glucose-Capillary 118 (H) 70 - 99 mg/dL    Comment: Glucose reference range applies only to samples taken after fasting for at least 8 hours.   Comment 1 Notify RN    Comment 2 Document in Chart   Glucose, capillary     Status: Abnormal   Collection Time: 07/22/19  9:27 PM  Result Value Ref Range   Glucose-Capillary 103 (H) 70 - 99 mg/dL    Comment: Glucose reference range applies only to samples taken after fasting for at least 8 hours.   Comment 1 Notify RN    Comment 2 Document in Chart   Brain natriuretic peptide     Status: Abnormal   Collection Time: 07/23/19  2:43 AM  Result Value Ref Range   B Natriuretic Peptide 1,811.9 (H) 0.0 - 100.0 pg/mL    Comment: Performed at [facility] [address], [place], [place] [address]  Lactate dehydrogenase     Status: Abnormal   Collection Time: 07/23/19  2:43 AM  Result Value Ref Range   LDH 233 (H) 98 - 192 U/L    Comment: Performed at [facility], [place] [address], [place], [place] [address]  Protime-INR     Status: Abnormal   Collection Time: 07/23/19  2:43 AM  Result Value Ref Range   Prothrombin Time 25.9 (H) 11.4 - 15.2 seconds   INR 2.5 (H) 0.8 - 1.2    Comment: (NOTE) INR goal varies based on device and disease states. Performed at [facility], [place] [address], [place], [place] [address]   Magnesium     Status: None   Collection Time: 07/23/19  2:43 AM  Result Value Ref Range   Magnesium 2.0 1.7 - 2.4 mg/dL      Comment: Performed at [facility] [address], [place] [address]  CBC     Status: Abnormal   Collection Time: 07/23/19  2:43 AM  Result Value Ref Range   WBC 8.9 4.0 - 10.5 K/uL   RBC 4.20 (L) 4.22 - 5.81 MIL/uL   Hemoglobin 12.2 (L)  13.0 - 17.0 g/dL   HCT 39.3 39 - 52 %   MCV 93.6 80.0 - 100.0 fL   MCH 29.0 26.0 - 34.0 pg   MCHC 31.0 30.0 - 36.0 g/dL   RDW 16.3 (H) 11.5 - 15.5 %   Platelets 285 150 - 400 K/uL   nRBC 0.0 0.0 - 0.2 %    Comment: Performed at [facility] [address], [place], [place] [address]  Basic metabolic panel     Status: Abnormal   Collection Time: 07/23/19  2:43 AM  Result Value Ref Range   Sodium 134 (L) 135 - 145 mmol/L   Potassium 4.2 3.5 - 5.1 mmol/L   Chloride 97 (L) 98 - 111 mmol/L   CO2 27 22 - 32 mmol/L   Glucose, Bld 105 (H) 70 - 99 mg/dL    Comment: Glucose reference range applies only to samples taken after fasting for at least 8 hours.   BUN 15 6 - 20 mg/dL   Creatinine, Ser 1.18 0.61 - 1.24 mg/dL   Calcium 9.1 8.9 - 10.3 mg/dL   GFR calc non Af Amer >60 >60 mL/min   GFR calc Af Amer >60 >60 mL/min   Anion gap 10 5 - 15    Comment: Performed at [facility] [address] [place], [place] [address]  Cooxemetry Panel (carboxy, met, total hgb, O2 sat)     Status: Abnormal   Collection Time: 07/23/19  2:46 AM  Result Value Ref Range   Total hemoglobin 12.9 12.0 - 16.0 g/dL   O2 Saturation 51.8 %   Carboxyhemoglobin 1.8 (H) 0.5 - 1.5 %   Methemoglobin 1.1 0.0 - 1.5 %    Comment: Performed at [facility] [address] [place], [place] [address]  Glucose, capillary     Status: None   Collection Time: 07/23/19  6:31 AM  Result Value Ref Range   Glucose-Capillary 96 70 - 99 mg/dL    Comment: Glucose reference range applies only to samples taken after fasting for at least 8 hours.   Comment 1 Notify RN    Comment 2 Document in Chart   Glucose, capillary     Status: Abnormal   Collection Time:  07/23/19 11:35 AM  Result Value Ref Range   Glucose-Capillary 117 (H) 70 - 99 mg/dL    Comment: Glucose reference range applies only to samples taken after fasting for at least 8 hours.   Comment 1 Notify RN    Comment 2 Document in Chart   Glucose, capillary     Status: Abnormal   Collection Time: 07/23/19  4:25 PM  Result Value Ref Range   Glucose-Capillary 108 (H) 70 - 99 mg/dL    Comment: Glucose reference range applies only to samples taken after fasting for at least 8 hours.  Glucose, capillary     Status: None   Collection Time: 07/23/19  9:24 PM  Result Value Ref Range   Glucose-Capillary 99 70 - 99 mg/dL    Comment: Glucose reference range applies only to samples taken after fasting for at least 8 hours.   Comment 1 Notify RN   Cooxemetry Panel (carboxy, met, total hgb, O2 sat)     Status: None   Collection Time: 07/24/19  2:55 AM  Result Value Ref Range   Total hemoglobin 12.3 12.0 - 16.0 g/dL   O2 Saturation 49.7 %   Carboxyhemoglobin 1.4 0.5 - 1.5 %   Methemoglobin 0.6  0.0 - 1.5 %    Comment: Performed at [facility], [place] [address] [place], [place] [address]  Lactate dehydrogenase     Status: Abnormal   Collection Time: 07/24/19  3:02 AM  Result Value Ref Range   LDH 194 (H) 98 - 192 U/L    Comment: Performed at [facility], [place] [address] [place], [place] [address]  Protime-INR     Status: Abnormal   Collection Time: 07/24/19  3:02 AM  Result Value Ref Range   Prothrombin Time 31.7 (H) 11.4 - 15.2 seconds   INR 3.2 (H) 0.8 - 1.2    Comment: (NOTE) INR goal varies based on device and disease states. Performed at [facility], [place] [address] [place], [place] [address]   Magnesium     Status: None   Collection Time: 07/24/19  3:02 AM  Result Value Ref Range   Magnesium 2.0 1.7 - 2.4 mg/dL    Comment: Performed at [facility] [address], [place], [place] [address]  Basic metabolic panel     Status: Abnormal   Collection Time:  07/24/19  3:02 AM  Result Value Ref Range   Sodium 132 (L) 135 - 145 mmol/L   Potassium 4.0 3.5 - 5.1 mmol/L   Chloride 98 98 - 111 mmol/L   CO2 27 22 - 32 mmol/L   Glucose, Bld 98 70 - 99 mg/dL    Comment: Glucose reference range applies only to samples taken after fasting for at least 8 hours.   BUN 13 6 - 20 mg/dL   Creatinine, Ser 1.09 0.61 - 1.24 mg/dL   Calcium 8.7 (L) 8.9 - 10.3 mg/dL   GFR calc non Af Amer >60 >60 mL/min   GFR calc Af Amer >60 >60 mL/min   Anion gap 7 5 - 15    Comment: Performed at [facility] [address] [place], [place] [address]  CBC     Status: Abnormal   Collection Time: 07/24/19  3:02 AM  Result Value Ref Range   WBC 8.9 4.0 - 10.5 K/uL   RBC 4.05 (L) 4.22 - 5.81 MIL/uL   Hemoglobin 11.8 (L) 13.0 - 17.0 g/dL   HCT 38.0 (L) 39 - 52 %   MCV 93.8 80.0 - 100.0 fL   MCH 29.1 26.0 - 34.0 pg   MCHC 31.1 30.0 - 36.0 g/dL   RDW 16.4 (H) 11.5 - 15.5 %   Platelets 259 150 - 400 K/uL   nRBC 0.0 0.0 - 0.2 %    Comment: Performed at [facility], [place] [address] [place], [place] [address]  Glucose, capillary     Status: Abnormal   Collection Time: 07/24/19  6:13 AM  Result Value Ref Range   Glucose-Capillary 108 (H) 70 - 99 mg/dL    Comment: Glucose reference range applies only to samples taken after fasting for at least 8 hours.   Comment 1 Notify RN   Glucose, capillary     Status: Abnormal   Collection Time: 07/24/19 11:46 AM  Result Value Ref Range   Glucose-Capillary 109 (H) 70 - 99 mg/dL    Comment: Glucose reference range applies only to samples taken after fasting for at least 8 hours.   No results found.     Medical Problem List and Plan: 1.  Debility secondary to STEMI/LVAD 06/25/2019.  Sternal precautions  -patient may not shower  -ELOS/Goals: 12-16 days/supervision/min a  Admit to CIR 2.  Antithrombotics: -DVT/anticoagulation: Coumadin  -antiplatelet therapy: Aspirin 81 mg daily 3. Pain Management: Neurontin 300 mg 3  times daily, oxycodone/tramadol as needed  Monitor with increased exertion 4. Mood: Melatonin 5 mg nightly, Klonopin 0.5 mg twice daily, Xanax as needed, trazodone  -antipsychotic agents: N/A 5. Neuropsych: This patient is capable of making decisions on his own behalf. 6. Skin/Wound Care: Routine skin checks 7. Fluids/Electrolytes/Nutrition: Routine in and outs.  CMP ordered. 8.  VF requiring DCCV.  Follow-up cardiology services.  Continue amiodarone 400 mg twice daily as well as Lanoxin 0.125 mg daily, Ranexa 500 mg twice daily 9.  Systolic congestive heart failure.  Monitor for any signs of fluid overload.  Daily weights 10.  Hyperlipidemia.  Continue Lipitor 11.  Tobacco abuse.  Counseling 12.  Renal cell carcinoma.  MRI of abdomen showed a 1.8 cm right renal lesion.  Patient would follow-up outpatient with urology services. 13. Constipation.  MiraLAX daily, Colace 100 mg daily.  Adjust bowel meds as necessary  Cathlyn Parsons, PA-C 07/24/2019  I have personally performed a face to face diagnostic evaluation, including, but not limited to relevant history and physical exam findings, of this patient and developed relevant assessment and plan.  Additionally, I have reviewed and concur with the physician assistant's documentation above.  Delice Lesch, MD, ABPMR

## 2019-07-23 NOTE — Progress Notes (Signed)
Orthostatic vitals taken as follows:  BP Lying: 102/79 Pulse:       128  BP Sitting: 96/78 Pulse:        81  BP Standing 0 mins: 92/75 Pulse:                         106   Patient unable to stand for 3 mins for last BP/Pulse. Patient stated he was "feeling light headed".

## 2019-07-24 ENCOUNTER — Inpatient Hospital Stay (HOSPITAL_COMMUNITY)
Admission: RE | Admit: 2019-07-24 | Discharge: 2019-07-28 | DRG: 945 | Disposition: A | Discharge status: Other Acute Inpatient Hospital | Payer: BC Managed Care – PPO | Source: Intra-hospital | Attending: Physical Medicine and Rehabilitation | Admitting: Physical Medicine and Rehabilitation

## 2019-07-24 ENCOUNTER — Other Ambulatory Visit: Payer: Self-pay

## 2019-07-24 ENCOUNTER — Encounter (HOSPITAL_COMMUNITY): Payer: Self-pay | Admitting: Physical Medicine and Rehabilitation

## 2019-07-24 DIAGNOSIS — I2510 Atherosclerotic heart disease of native coronary artery without angina pectoris: Secondary | ICD-10-CM | POA: Diagnosis present

## 2019-07-24 DIAGNOSIS — E785 Hyperlipidemia, unspecified: Secondary | ICD-10-CM | POA: Diagnosis present

## 2019-07-24 DIAGNOSIS — E871 Hypo-osmolality and hyponatremia: Secondary | ICD-10-CM

## 2019-07-24 DIAGNOSIS — I2109 ST elevation (STEMI) myocardial infarction involving other coronary artery of anterior wall: Secondary | ICD-10-CM | POA: Diagnosis present

## 2019-07-24 DIAGNOSIS — Z7982 Long term (current) use of aspirin: Secondary | ICD-10-CM

## 2019-07-24 DIAGNOSIS — I4901 Ventricular fibrillation: Secondary | ICD-10-CM | POA: Diagnosis not present

## 2019-07-24 DIAGNOSIS — Z95811 Presence of heart assist device: Secondary | ICD-10-CM

## 2019-07-24 DIAGNOSIS — R570 Cardiogenic shock: Secondary | ICD-10-CM | POA: Diagnosis not present

## 2019-07-24 DIAGNOSIS — R579 Shock, unspecified: Secondary | ICD-10-CM | POA: Diagnosis not present

## 2019-07-24 DIAGNOSIS — I255 Ischemic cardiomyopathy: Secondary | ICD-10-CM | POA: Diagnosis present

## 2019-07-24 DIAGNOSIS — Z716 Tobacco abuse counseling: Secondary | ICD-10-CM

## 2019-07-24 DIAGNOSIS — I110 Hypertensive heart disease with heart failure: Secondary | ICD-10-CM | POA: Diagnosis present

## 2019-07-24 DIAGNOSIS — C641 Malignant neoplasm of right kidney, except renal pelvis: Secondary | ICD-10-CM | POA: Diagnosis present

## 2019-07-24 DIAGNOSIS — I5023 Acute on chronic systolic (congestive) heart failure: Secondary | ICD-10-CM

## 2019-07-24 DIAGNOSIS — Z8249 Family history of ischemic heart disease and other diseases of the circulatory system: Secondary | ICD-10-CM

## 2019-07-24 DIAGNOSIS — Z79899 Other long term (current) drug therapy: Secondary | ICD-10-CM | POA: Diagnosis not present

## 2019-07-24 DIAGNOSIS — R5381 Other malaise: Secondary | ICD-10-CM | POA: Diagnosis present

## 2019-07-24 DIAGNOSIS — R42 Dizziness and giddiness: Secondary | ICD-10-CM | POA: Diagnosis not present

## 2019-07-24 DIAGNOSIS — K5900 Constipation, unspecified: Secondary | ICD-10-CM | POA: Diagnosis present

## 2019-07-24 DIAGNOSIS — Z79891 Long term (current) use of opiate analgesic: Secondary | ICD-10-CM | POA: Diagnosis not present

## 2019-07-24 DIAGNOSIS — G4730 Sleep apnea, unspecified: Secondary | ICD-10-CM | POA: Diagnosis present

## 2019-07-24 DIAGNOSIS — I952 Hypotension due to drugs: Secondary | ICD-10-CM

## 2019-07-24 DIAGNOSIS — K5901 Slow transit constipation: Secondary | ICD-10-CM | POA: Diagnosis not present

## 2019-07-24 DIAGNOSIS — Z885 Allergy status to narcotic agent status: Secondary | ICD-10-CM | POA: Diagnosis not present

## 2019-07-24 DIAGNOSIS — Z7901 Long term (current) use of anticoagulants: Secondary | ICD-10-CM | POA: Diagnosis not present

## 2019-07-24 DIAGNOSIS — Z7289 Other problems related to lifestyle: Secondary | ICD-10-CM

## 2019-07-24 DIAGNOSIS — Z9889 Other specified postprocedural states: Secondary | ICD-10-CM

## 2019-07-24 DIAGNOSIS — F17210 Nicotine dependence, cigarettes, uncomplicated: Secondary | ICD-10-CM | POA: Diagnosis present

## 2019-07-24 DIAGNOSIS — J9601 Acute respiratory failure with hypoxia: Secondary | ICD-10-CM | POA: Diagnosis not present

## 2019-07-24 LAB — CBC
HCT: 38 % — ABNORMAL LOW (ref 39.0–52.0)
Hemoglobin: 11.8 g/dL — ABNORMAL LOW (ref 13.0–17.0)
MCH: 29.1 pg (ref 26.0–34.0)
MCHC: 31.1 g/dL (ref 30.0–36.0)
MCV: 93.8 fL (ref 80.0–100.0)
Platelets: 259 10*3/uL (ref 150–400)
RBC: 4.05 MIL/uL — ABNORMAL LOW (ref 4.22–5.81)
RDW: 16.4 % — ABNORMAL HIGH (ref 11.5–15.5)
WBC: 8.9 10*3/uL (ref 4.0–10.5)
nRBC: 0 % (ref 0.0–0.2)

## 2019-07-24 LAB — BASIC METABOLIC PANEL
Anion gap: 7 (ref 5–15)
BUN: 13 mg/dL (ref 6–20)
CO2: 27 mmol/L (ref 22–32)
Calcium: 8.7 mg/dL — ABNORMAL LOW (ref 8.9–10.3)
Chloride: 98 mmol/L (ref 98–111)
Creatinine, Ser: 1.09 mg/dL (ref 0.61–1.24)
GFR calc Af Amer: >60 mL/min (ref >60)
GFR calc non Af Amer: >60 mL/min (ref >60)
Glucose, Bld: 98 mg/dL (ref 70–99)
Potassium: 4 mmol/L (ref 3.5–5.1)
Sodium: 132 mmol/L — ABNORMAL LOW (ref 135–145)

## 2019-07-24 LAB — COOXEMETRY PANEL
Carboxyhemoglobin: 1.4 % (ref 0.5–1.5)
Methemoglobin: 0.6 % (ref 0.0–1.5)
O2 Saturation: 49.7 %
Total hemoglobin: 12.3 g/dL (ref 12.0–16.0)

## 2019-07-24 LAB — MAGNESIUM: Magnesium: 2 mg/dL (ref 1.7–2.4)

## 2019-07-24 LAB — GLUCOSE, CAPILLARY
Glucose-Capillary: 108 mg/dL — ABNORMAL HIGH (ref 70–99)
Glucose-Capillary: 109 mg/dL — ABNORMAL HIGH (ref 70–99)
Glucose-Capillary: 129 mg/dL — ABNORMAL HIGH (ref 70–99)

## 2019-07-24 LAB — PROTIME-INR
INR: 3.2 — ABNORMAL HIGH (ref 0.8–1.2)
INR: 3.2 — ABNORMAL HIGH (ref 0.8–1.2)
Prothrombin Time: 31.7 seconds — ABNORMAL HIGH (ref 11.4–15.2)
Prothrombin Time: 31.4 seconds — ABNORMAL HIGH (ref 11.4–15.2)

## 2019-07-24 LAB — LACTATE DEHYDROGENASE: LDH: 194 U/L — ABNORMAL HIGH (ref 98–192)

## 2019-07-24 MED ORDER — CLONAZEPAM 0.5 MG PO TABS
0.5000 mg | ORAL_TABLET | Freq: Two times a day (BID) | ORAL | Status: DC
  Administered 2019-07-24 – 2019-07-28 (×8): 0.5 mg via ORAL
  Filled 2019-07-24 (×8): qty 1

## 2019-07-24 MED ORDER — DIGOXIN 125 MCG PO TABS
0.1250 mg | ORAL_TABLET | Freq: Every day | ORAL | Status: DC
  Administered 2019-07-25 – 2019-07-28 (×4): 0.125 mg via ORAL
  Filled 2019-07-24 (×4): qty 1

## 2019-07-24 MED ORDER — CALCIUM CARBONATE ANTACID 500 MG PO CHEW
200.0000 mg | CHEWABLE_TABLET | Freq: Three times a day (TID) | ORAL | Status: DC | PRN
  Administered 2019-07-27: 200 mg via ORAL
  Filled 2019-07-24: qty 1

## 2019-07-24 MED ORDER — AMIODARONE HCL 200 MG PO TABS
400.0000 mg | ORAL_TABLET | Freq: Two times a day (BID) | ORAL | Status: DC
  Administered 2019-07-24 – 2019-07-28 (×8): 400 mg via ORAL
  Filled 2019-07-24 (×8): qty 2

## 2019-07-24 MED ORDER — CHLORHEXIDINE GLUCONATE CLOTH 2 % EX PADS
6.0000 | MEDICATED_PAD | Freq: Every day | CUTANEOUS | Status: DC
  Administered 2019-07-24 – 2019-07-27 (×4): 6 via TOPICAL

## 2019-07-24 MED ORDER — MELATONIN 5 MG PO TABS
5.0000 mg | ORAL_TABLET | Freq: Every day | ORAL | Status: DC
  Administered 2019-07-24 – 2019-07-27 (×4): 5 mg via ORAL
  Filled 2019-07-24 (×4): qty 1

## 2019-07-24 MED ORDER — SILDENAFIL CITRATE 20 MG PO TABS
20.0000 mg | ORAL_TABLET | Freq: Three times a day (TID) | ORAL | Status: DC
  Administered 2019-07-24 – 2019-07-28 (×11): 20 mg via ORAL
  Filled 2019-07-24 (×13): qty 1

## 2019-07-24 MED ORDER — TRAMADOL HCL 50 MG PO TABS: 50.0000 mg | ORAL_TABLET | ORAL | Status: DC | PRN

## 2019-07-24 MED ORDER — BISACODYL 10 MG RE SUPP: 10.0000 mg | Freq: Every day | RECTAL | Status: DC | PRN

## 2019-07-24 MED ORDER — ENSURE ENLIVE PO LIQD
237.0000 mL | Freq: Two times a day (BID) | ORAL | Status: DC
  Administered 2019-07-24 – 2019-07-26 (×3): 237 mL via ORAL

## 2019-07-24 MED ORDER — TRAZODONE HCL 50 MG PO TABS: 100.0000 mg | ORAL_TABLET | Freq: Every evening | ORAL | Status: DC | PRN

## 2019-07-24 MED ORDER — WARFARIN SODIUM 1 MG PO TABS: 1.0000 mg | ORAL_TABLET | Freq: Once | ORAL | Status: DC

## 2019-07-24 MED ORDER — THIAMINE HCL 100 MG PO TABS
100.0000 mg | ORAL_TABLET | Freq: Every day | ORAL | Status: DC
  Administered 2019-07-25 – 2019-07-28 (×4): 100 mg via ORAL
  Filled 2019-07-24 (×4): qty 1

## 2019-07-24 MED ORDER — FOLIC ACID 1 MG PO TABS
1.0000 mg | ORAL_TABLET | Freq: Every day | ORAL | Status: DC
  Administered 2019-07-25 – 2019-07-28 (×4): 1 mg via ORAL
  Filled 2019-07-24 (×4): qty 1

## 2019-07-24 MED ORDER — SILDENAFIL CITRATE 20 MG PO TABS
20.0000 mg | ORAL_TABLET | Freq: Three times a day (TID) | ORAL | Status: DC
  Administered 2019-07-24: 20 mg via ORAL
  Filled 2019-07-24: qty 1

## 2019-07-24 MED ORDER — HYDROCORTISONE 1 % EX CREA
TOPICAL_CREAM | Freq: Three times a day (TID) | CUTANEOUS | Status: DC
  Filled 2019-07-24: qty 28

## 2019-07-24 MED ORDER — GABAPENTIN 600 MG PO TABS
300.0000 mg | ORAL_TABLET | Freq: Three times a day (TID) | ORAL | Status: DC
  Administered 2019-07-24 – 2019-07-28 (×11): 300 mg via ORAL
  Filled 2019-07-24 (×12): qty 1

## 2019-07-24 MED ORDER — SORBITOL 70 % SOLN: 30.0000 mL | Freq: Every day | Status: DC | PRN

## 2019-07-24 MED ORDER — ADULT MULTIVITAMIN W/MINERALS CH
1.0000 | ORAL_TABLET | Freq: Every day | ORAL | Status: DC
  Administered 2019-07-25 – 2019-07-28 (×4): 1 via ORAL
  Filled 2019-07-24 (×4): qty 1

## 2019-07-24 MED ORDER — SODIUM CHLORIDE 0.9 % IV BOLUS
500.0000 mL | Freq: Once | INTRAVENOUS | Status: AC
  Administered 2019-07-24: 500 mL via INTRAVENOUS

## 2019-07-24 MED ORDER — OXYCODONE HCL 5 MG PO TABS
5.0000 mg | ORAL_TABLET | ORAL | Status: DC | PRN
  Administered 2019-07-24 – 2019-07-25 (×3): 10 mg via ORAL
  Administered 2019-07-26: 5 mg via ORAL
  Administered 2019-07-26 – 2019-07-28 (×6): 10 mg via ORAL
  Filled 2019-07-24 (×10): qty 2

## 2019-07-24 MED ORDER — POLYVINYL ALCOHOL 1.4 % OP SOLN
1.0000 [drp] | OPHTHALMIC | Status: DC | PRN
  Filled 2019-07-24: qty 15

## 2019-07-24 MED ORDER — ASPIRIN 81 MG PO CHEW
81.0000 mg | CHEWABLE_TABLET | Freq: Every day | ORAL | Status: DC
  Administered 2019-07-25 – 2019-07-28 (×4): 81 mg via ORAL
  Filled 2019-07-24 (×4): qty 1

## 2019-07-24 MED ORDER — RANOLAZINE ER 500 MG PO TB12
500.0000 mg | ORAL_TABLET | Freq: Two times a day (BID) | ORAL | Status: DC
  Administered 2019-07-24 – 2019-07-28 (×8): 500 mg via ORAL
  Filled 2019-07-24 (×9): qty 1

## 2019-07-24 MED ORDER — ATORVASTATIN CALCIUM 80 MG PO TABS
80.0000 mg | ORAL_TABLET | Freq: Every day | ORAL | Status: DC
  Administered 2019-07-25 – 2019-07-28 (×4): 80 mg via ORAL
  Filled 2019-07-24 (×4): qty 1

## 2019-07-24 MED ORDER — COLCHICINE 0.6 MG PO TABS: 0.6000 mg | ORAL_TABLET | Freq: Two times a day (BID) | ORAL | Status: DC

## 2019-07-24 MED ORDER — PANTOPRAZOLE SODIUM 40 MG PO TBEC
40.0000 mg | DELAYED_RELEASE_TABLET | Freq: Every day | ORAL | Status: DC
  Administered 2019-07-25 – 2019-07-28 (×4): 40 mg via ORAL
  Filled 2019-07-24 (×4): qty 1

## 2019-07-24 MED ORDER — WARFARIN - PHARMACIST DOSING INPATIENT: Freq: Every day | Status: DC

## 2019-07-24 MED ORDER — ALPRAZOLAM 0.25 MG PO TABS: 0.2500 mg | ORAL_TABLET | Freq: Three times a day (TID) | ORAL | Status: DC | PRN

## 2019-07-24 MED ORDER — ACETAMINOPHEN 325 MG PO TABS: 650.0000 mg | ORAL_TABLET | ORAL | Status: DC | PRN

## 2019-07-24 MED ORDER — SODIUM CHLORIDE 0.9% FLUSH: 10.0000 mL | INTRAVENOUS | Status: DC | PRN

## 2019-07-24 MED ORDER — SILDENAFIL CITRATE 20 MG PO TABS: 20.0000 mg | ORAL_TABLET | Freq: Three times a day (TID) | ORAL | 0 refills | Status: AC

## 2019-07-24 NOTE — Progress Notes (Addendum)
Patient arrived to unit and oriented. Verbalized understanding to call for assistance and prevent falls. Discussed lack of appetite and diarrhea with RN. Performed battery test. VAD equipment plugged into wall and safety back up bag present. Resting comfortably with call bell in place.  Per patient and report, dressing change will be switched to every other day. Dressing changed by VAD RN today 7/23, will be due again on Sunday 7/25 and family member or VAD RN will perform as discussed today prior to admission to Rehab.

## 2019-07-24 NOTE — Plan of Care (Signed)
Problem: Education: Goal: Understanding of cardiac disease, CV risk reduction, and recovery process will improve 07/24/2019 1107 by Don Perking, RN Outcome: Progressing 07/24/2019 1056 by Don Perking, RN Outcome: Progressing Goal: Understanding of medication regimen will improve 07/24/2019 1107 by Don Perking, RN Outcome: Progressing 07/24/2019 1056 by Don Perking, RN Outcome: Progressing Goal: Individualized Educational Video(s) 07/24/2019 1107 by Don Perking, RN Outcome: Progressing 07/24/2019 1056 by Don Perking, RN Outcome: Progressing   Problem: Activity: Goal: Ability to tolerate increased activity will improve 07/24/2019 1107 by Don Perking, RN Outcome: Progressing 07/24/2019 1056 by Don Perking, RN Outcome: Progressing   Problem: Cardiac: Goal: Ability to achieve and maintain adequate cardiopulmonary perfusion will improve 07/24/2019 1107 by Don Perking, RN Outcome: Progressing 07/24/2019 1056 by Don Perking, RN Outcome: Progressing   Problem: Health Behavior/Discharge Planning: Goal: Ability to safely manage health-related needs after discharge will improve 07/24/2019 1107 by Don Perking, RN Outcome: Progressing 07/24/2019 1056 by Don Perking, RN Outcome: Progressing   Problem: Education: Goal: Knowledge of General Education information will improve Description: Including pain rating scale, medication(s)/side effects and non-pharmacologic comfort measures 07/24/2019 1107 by Don Perking, RN Outcome: Progressing 07/24/2019 1056 by Don Perking, RN Outcome: Progressing   Problem: Health Behavior/Discharge Planning: Goal: Ability to manage health-related needs will improve 07/24/2019 1107 by Don Perking, RN Outcome: Progressing 07/24/2019 1056 by Don Perking, RN Outcome: Progressing   Problem: Clinical Measurements: Goal: Ability to maintain clinical measurements  within normal limits will improve 07/24/2019 1107 by Don Perking, RN Outcome: Progressing 07/24/2019 1056 by Don Perking, RN Outcome: Progressing Goal: Will remain free from infection 07/24/2019 1107 by Don Perking, RN Outcome: Progressing 07/24/2019 1056 by Don Perking, RN Outcome: Progressing Goal: Diagnostic test results will improve 07/24/2019 1107 by Don Perking, RN Outcome: Progressing 07/24/2019 1056 by Don Perking, RN Outcome: Progressing Goal: Respiratory complications will improve 07/24/2019 1107 by Don Perking, RN Outcome: Progressing 07/24/2019 1056 by Don Perking, RN Outcome: Progressing Goal: Cardiovascular complication will be avoided 07/24/2019 1107 by Don Perking, RN Outcome: Progressing 07/24/2019 1056 by Don Perking, RN Outcome: Progressing   Problem: Activity: Goal: Risk for activity intolerance will decrease 07/24/2019 1107 by Don Perking, RN Outcome: Progressing 07/24/2019 1056 by Don Perking, RN Outcome: Progressing   Problem: Nutrition: Goal: Adequate nutrition will be maintained 07/24/2019 1107 by Don Perking, RN Outcome: Progressing 07/24/2019 1056 by Don Perking, RN Outcome: Progressing   Problem: Coping: Goal: Level of anxiety will decrease 07/24/2019 1107 by Don Perking, RN Outcome: Progressing 07/24/2019 1056 by Don Perking, RN Outcome: Progressing   Problem: Elimination: Goal: Will not experience complications related to bowel motility 07/24/2019 1107 by Don Perking, RN Outcome: Progressing 07/24/2019 1056 by Don Perking, RN Outcome: Progressing Goal: Will not experience complications related to urinary retention 07/24/2019 1107 by Don Perking, RN Outcome: Progressing 07/24/2019 1056 by Don Perking, RN Outcome: Progressing   Problem: Pain Managment: Goal: General experience of comfort will improve 07/24/2019  1107 by Don Perking, RN Outcome: Progressing 07/24/2019 1056 by Don Perking, RN Outcome: Progressing   Problem: Safety: Goal: Ability to remain free from injury will improve 07/24/2019 1107 by Don Perking, RN Outcome: Progressing 07/24/2019 1056 by Don Perking, RN Outcome: Progressing   Problem: Skin Integrity: Goal: Risk for impaired skin integrity will decrease 07/24/2019  1107 by Don Perking, RN Outcome: Progressing 07/24/2019 1056 by Don Perking, RN Outcome: Progressing   Problem: Education: Goal: Knowledge of the prescribed therapeutic regimen will improve 07/24/2019 1107 by Don Perking, RN Outcome: Progressing 07/24/2019 1056 by Don Perking, RN Outcome: Progressing   Problem: Activity: Goal: Risk for activity intolerance will decrease 07/24/2019 1107 by Don Perking, RN Outcome: Progressing 07/24/2019 1056 by Don Perking, RN Outcome: Progressing   Problem: Cardiac: Goal: Ability to maintain an adequate cardiac output will improve 07/24/2019 1107 by Don Perking, RN Outcome: Progressing 07/24/2019 1056 by Don Perking, RN Outcome: Progressing   Problem: Coping: Goal: Level of anxiety will decrease 07/24/2019 1107 by Don Perking, RN Outcome: Progressing 07/24/2019 1056 by Don Perking, RN Outcome: Progressing   Problem: Fluid Volume: Goal: Risk for excess fluid volume will decrease 07/24/2019 1107 by Don Perking, RN Outcome: Progressing 07/24/2019 1056 by Don Perking, RN Outcome: Progressing   Problem: Clinical Measurements: Goal: Ability to maintain clinical measurements within normal limits will improve 07/24/2019 1107 by Don Perking, RN Outcome: Progressing 07/24/2019 1056 by Don Perking, RN Outcome: Progressing Goal: Will remain free from infection 07/24/2019 1107 by Don Perking, RN Outcome: Progressing 07/24/2019 1056 by Don Perking, RN Outcome: Progressing   Problem: Respiratory: Goal: Will regain and/or maintain adequate ventilation 07/24/2019 1107 by Don Perking, RN Outcome: Progressing 07/24/2019 1056 by Don Perking, RN Outcome: Progressing   Problem: Education: Goal: Knowledge of the prescribed therapeutic regimen will improve 07/24/2019 1107 by Don Perking, RN Outcome: Progressing 07/24/2019 1056 by Don Perking, RN Outcome: Progressing   Problem: Activity: Goal: Risk for activity intolerance will decrease 07/24/2019 1107 by Don Perking, RN Outcome: Progressing 07/24/2019 1056 by Don Perking, RN Outcome: Progressing   Problem: Cardiac: Goal: Ability to maintain an adequate cardiac output will improve 07/24/2019 1107 by Don Perking, RN Outcome: Progressing 07/24/2019 1056 by Don Perking, RN Outcome: Progressing   Problem: Coping: Goal: Level of anxiety will decrease 07/24/2019 1107 by Don Perking, RN Outcome: Progressing 07/24/2019 1056 by Don Perking, RN Outcome: Progressing   Problem: Fluid Volume: Goal: Risk for excess fluid volume will decrease 07/24/2019 1107 by Don Perking, RN Outcome: Progressing 07/24/2019 1056 by Don Perking, RN Outcome: Progressing   Problem: Clinical Measurements: Goal: Ability to maintain clinical measurements within normal limits will improve 07/24/2019 1107 by Don Perking, RN Outcome: Progressing 07/24/2019 1056 by Don Perking, RN Outcome: Progressing Goal: Will remain free from infection 07/24/2019 1107 by Don Perking, RN Outcome: Progressing 07/24/2019 1056 by Don Perking, RN Outcome: Progressing   Problem: Respiratory: Goal: Will regain and/or maintain adequate ventilation 07/24/2019 1107 by Don Perking, RN Outcome: Progressing 07/24/2019 1056 by Don Perking, RN Outcome: Progressing

## 2019-07-24 NOTE — Progress Notes (Signed)
Inpatient Rehabilitation-Admissions Coordinator   Admission was held yesterday, 07/23/19 due to nausea/vomiting; Nausea and vomiting has now been resolved. Received medical clearance from HF team for admit to CIR today.  Discussed with admitting PM&R MD, Dr. Posey Pronto. Will admit to CIR today.   Pt aware and still wants to proceed with CIR. He has accepted bed offer.   AC will notify RN and [facility] team of plan for admit today.   Please call if questions.   Raechel Ache, OTR/L  Rehab Admissions Coordinator  [phone] 07/24/2019 12:01 PM

## 2019-07-24 NOTE — Progress Notes (Addendum)
Patient ID: Aaron [name], male   DOB: 1965-05-10, 54 y.o.   MRN: [id number]     Advanced Heart Failure Rounding Note  PCP-Cardiologist: No primary care provider on file.   Subjective:    - HM3 placement 6/24  - Had 2 episodes of VF requiring emergent DC-CV on 07/02/19 - Recurrent VF on 7/2 and broke spontaneously.  - 7/3 Recurrent VF at 2a.-> emergent DC-CV.  - 07/07/2019 VT/Torsades -> repeat DC_CV - 07/08/2019 VF/Torsades x2--> DC-CV x2.  - 07/09/19 developed sepsis/PNA -> intubated - 07/09/19 Multiple episodes of VT/VF -> IABP placed. AV pacing via EPW - 07/14/19 IABP removed and extubated. Pacing discontinued.  CIR transfer postponed yesterday after pt developed nausea and vomiting.   Feels better today. No further N/V.   Appetite remains poor. Does not like the food. Only had cereal for breakfast.   No cardiac symptoms. Denies CP. No dyspnea. Co-ox 50% today, which has been his baseline.   CVP 8-9 today.     Awaiting CIR  LVAD Interrogation HM III: Speed: 5200 Flow: 3.8 PI: 5.6 Power: 3.7.  Multiple PI events yesterday. None today. VAD interrogated personally. Parameters stable.   Objective:   Weight Range: 83.2 kg Body mass index is 27.09 kg/m.   Vital Signs:   Temp:  [97.6 F (36.4 C)-98.9 F (37.2 C)] 98.7 F (37.1 C) (07/23 0820) Pulse Rate:  [66-78] 71 (07/23 0820) Resp:  [12-21] 20 (07/23 0820) BP: (87-105)/(73-88) 105/88 (07/23 0820) SpO2:  [92 %-100 %] 99 % (07/23 0334) Weight:  [83.2 kg] 83.2 kg (07/23 0408) Last BM Date: 07/23/19  Weight change: Filed Weights   07/22/19 0500 07/23/19 0236 07/24/19 0408  Weight: 83.6 kg 83.6 kg 83.2 kg    Intake/Output:   Intake/Output Summary (Last 24 hours) at 07/24/2019 1034 Last data filed at 07/23/2019 2127 Gross per 24 hour  Intake 503 ml  Output --  Net 503 ml      Physical Exam   CVP 8-9 General:  [name]. OOB sitting up in chair. No distress HEENT: normal  Neck: supple. JVP ~8 cm.   Carotids 2+ bilat; no bruits. No lymphadenopathy or thryomegaly appreciated. Cor: +LVAD hum.  Lungs: CTAB, no wheezing  Abdomen: obese soft, nontender, non-distended. No hepatosplenomegaly. No bruits or masses. Good bowel sounds. Driveline site clean. Anchor in place.  Extremities: no cyanosis, clubbing, rash. No LEE, + RUE PICC  Neuro: alert & oriented x 3. No focal deficits. Moves all 4 without problem   Telemetry   NSR 80s. No VT  Personally reviewed    Labs    CBC Recent Labs    07/23/19 0243 07/24/19 0302  WBC 8.9 8.9  HGB 12.2* 11.8*  HCT 39.3 38.0*  MCV 93.6 93.8  PLT 285 010   Basic Metabolic Panel Recent Labs    07/23/19 0243 07/24/19 0302  NA 134* 132*  K 4.2 4.0  CL 97* 98  CO2 27 27  GLUCOSE 105* 98  BUN 15 13  CREATININE 1.18 1.09  CALCIUM 9.1 8.7*  MG 2.0 2.0   Liver Function Tests No results for input(s): AST, ALT, ALKPHOS, BILITOT, PROT, ALBUMIN in the last 72 hours. No results for input(s): LIPASE, AMYLASE in the last 72 hours. Cardiac Enzymes No results for input(s): CKTOTAL, CKMB, CKMBINDEX, TROPONINI in the last 72 hours.  BNP: BNP (last 3 results) Recent Labs    07/09/19 0313 07/16/19 0346 07/23/19 0243  BNP 1,815.2* 3,431.7* 1,811.9*    ProBNP (  last 3 results) No results for input(s): PROBNP in the last 8760 hours.   D-Dimer No results for input(s): DDIMER in the last 72 hours. Hemoglobin A1C No results for input(s): HGBA1C in the last 72 hours. Fasting Lipid Panel No results for input(s): CHOL, HDL, LDLCALC, TRIG, CHOLHDL, LDLDIRECT in the last 72 hours. Thyroid Function Tests No results for input(s): TSH, T4TOTAL, T3FREE, THYROIDAB in the last 72 hours.  Invalid input(s): FREET3  Other results:   Imaging    No results found.   Medications:     Scheduled Medications: . amiodarone  400 mg Oral BID  . aspirin  81 mg Oral Daily  . atorvastatin  80 mg Oral Daily  . chlorhexidine  15 mL Mouth Rinse BID  .  Chlorhexidine Gluconate Cloth  6 each Topical Daily  . clonazePAM  0.5 mg Oral BID  . colchicine  0.6 mg Oral BID  . digoxin  0.125 mg Oral Daily  . feeding supplement (ENSURE ENLIVE)  237 mL Oral BID BM  . folic acid  1 mg Oral Daily  . gabapentin  300 mg Oral TID  . hydrocortisone cream   Topical TID  . insulin aspart  0-5 Units Subcutaneous QHS  . insulin aspart  0-9 Units Subcutaneous TID WC  . mouth rinse  15 mL Mouth Rinse q12n4p  . melatonin  5 mg Oral QPC supper  . multivitamin with minerals  1 tablet Oral Daily  . pantoprazole  40 mg Oral Daily  . ranolazine  500 mg Oral BID  . sodium chloride flush  10-40 mL Intracatheter Q12H  . sodium chloride flush  3 mL Intravenous Q12H  . sodium chloride flush  3 mL Intravenous Q12H  . thiamine  100 mg Oral Daily  . Warfarin - Pharmacist Dosing Inpatient   Does not apply q1600    Infusions: . sodium chloride    . sodium chloride Stopped (07/15/19 0129)  . lactated ringers      PRN Medications: sodium chloride, acetaminophen, ALPRAZolam, bisacodyl, calcium carbonate, dextrose, fentaNYL (SUBLIMAZE) injection, ondansetron (ZOFRAN) IV, oxyCODONE, pneumococcal 23 valent vaccine, polyvinyl alcohol, sodium chloride flush, sodium chloride flush, traMADol, traZODone    Patient Profile   Aaron [name] --post MI cardiogenic shock/ optimization prior to anticipated CABG, at the request of Dr. Marlou [name], Cardiology.    Assessment/Plan   1. Refractory VT/VF  - developed VF on 7/1 x 2 requiring emergent DC-CV - On 7/2 had self-terminating episode - On 7/3 persistent VF/torsades -> DC-CV - On 7/6  VF/torsades.  - On 7/7 VF /Torsades on 2 separate occasions-> DC-CV x2.  - On 7/8 multiple episodes on VT/VF (incessant) -> unclear which intervention broke it   - Now off mexiletine and on amiodarone 400 mg bid.  - Keep K> 4.0 Mg >2.0 (4.0 and 2.0 today).  - VAD speed initially reduced to 5000 but output low. Now at 5200.  - Keep off  inotropes with VT.  - No ventricular arrhythmias on telemetry - I have d/w EP regarding role of ICD for him. They will discuss.   2. Acute Anterolateral STEMI/ Multivessel CAD - Emergent cath showed severe multivessel disease w/ 75% mid- distal LM, 100% prox LAD, not amendable to PCI (unable to cross w/ guide wire, probable CTO), 100% prox LCx (probable CTO) and 100% ostial RCA (probable CTO). There were mild to moderate collaterals right to left to the LAD, minimal right to right collaterals an minimal left to left collaterals. LVEF 25% -  Hs trop peaked 27,000. - No targets for CABG / PCI.   - S/P HMIII VAD - suspect VF was ischemically-mediated - No s/s ischemia currently  3.  Acute Systolic Heart Failure --->Cardiogenic Shock - 2/2 acute MI. LVEF by LVG 25% - IABP out 6/18 but replaced 6/23. Removed in OR on 6/24  - HMIII LVAD  Placed 6/24 - Extubated 6/25  - Was on milrinone for RV failure. Now off due to VF.  - IABP removed 7/13 - CO-OX marginal at 50% but c/w his baseline   - off diuretics. CVP 8 today  - Midodrine discontinued, MAPs 80-90s - Continue digoxin for RV support.   - LDH  Peaked  2600 but now down to 194. No evidence pump thrombosis  - Ramp echo 7/8 RV only mildly reduced. Good cannula position. Increase VAD speed as tolerated, currently 5200.  Would repeat ramp echo prior to discharge (early this week) now that ventricular arrhythmias have calmed, may be able to increase  speed further.  - INR 3.2. Goal 2-2.5. Hold Coumadin tonight. Discussed dosing with PharmD personally. - has mild superficial driveline irritation. Continue to follow.   4. Acute hypoxic respiratory failure - intubated 7/8. Extubated 7/13 - Chest CT with bilateral PNA R>L - completed abx therapy w/ vanc/cefipime - PCT 1.97 => 1.3 => 0.6.  - BCx and BAL NGTD - Currently AF w/ normal WBC ct  - No change  5. Shock liver - Resolved.  6. Tobacco Abuse - 30 pack years  - smoking cessation  advised  - Will need to quit if to be considered for heart transplant in the future   7. Renal mass = RCC - MRI abdomen- 1.8 cm right renal lesion, most consistent with renal cell carcinoma. No evidence of renal vein involvement or nodal metastasis. - Discussed w/ Urology and R RCC is small and will just require surveillance.  Ok to proceed with VAD from their standpoint.   8. Hyponatremia  - 132 today  9. Sleep Apnea - frequent apnea at night  - needs formal sleep study   - CPAP ordered while inpatient  10. Anemia - Post-op anemia - hgb has improved and now stabilized ~12  11. N/V - improved w/ antiemetics - no recurrence today   12. Debility - continue to mobilize w/ PT, walking more.  - Transfer to CIR today     Lyda Jester, PA-C  07/24/2019 10:34 AM  Patient seen and examined with the above-signed Advanced Practice Provider and/or Housestaff. I personally reviewed laboratory data, imaging studies and relevant notes. I independently examined the patient and formulated the important aspects of the plan. I have edited the note to reflect any of my changes or salient points. I have personally discussed the plan with the patient and/or family.  Nausea improved. Still with some orthostasis. Co-ox marginal. CVP 8-9. VAD interrogated personally. Parameters stable.  General:  NAD.  HEENT: normal  Neck: supple. JVP not elevated.  Carotids 2+ bilat; no bruits. No lymphadenopathy or thryomegaly appreciated. Cor: LVAD hum.  Lungs: Clear. Abdomen: obese soft, nontender, non-distended. No hepatosplenomegaly. No bruits or masses. Good bowel sounds. Driveline site clean. Anchor in place.  Extremities: no cyanosis, clubbing, rash. Warm no edema  Neuro: alert & oriented x 3. No focal deficits. Moves all 4 without problem   Stil struggling a bit with RV failure but VAD support is good. Will give 500cc NS and start sildenafil. [place] for [employer] today. We will follow while in  rehab. INR  3.2.  Discussed dosing with PharmD personally.  Glori Bickers, MD  12:37 PM

## 2019-07-24 NOTE — Progress Notes (Signed)
Aaron Heys, MD  Physician  Physical Medicine and Rehabilitation  PMR Pre-admission      Signed  Date of Service:  07/20/2019  2:27 PM      Related encounter: Admission (Discharged) from 06/17/2019 in [place]       PMR Admission Coordinator Pre-Admission Assessment   Patient: Aaron [name] is an 54 y.o., male MRN: [id number] DOB: 08-12-1965 Height: '5\' 9"'  (175.3 cm) Weight: 83.6 kg   Insurance Information HMO:     PPO: yes     PCP:      IPA:      80/20:      OTHER:  PRIMARY: [employer] [place]      Policy#: [id number]      Subscriber: patient CM Name: Aaron [name]      Phone#: [phone]     Fax#: [phone] Pre-Cert#: [id number]      Employer:  Aaron [name] provided by fax from Aaron [name] for admit to CIR with effective dates: 07/21/19 with clinical updates due 08/11/19 to [place] (p): [phone] (f): [phone]; confirmed with Aaron [name] is still good for an admit 07/23/19.  Benefits:  Phone #: [phone]     Name:  Eff. Date: 12/02/2018 - still active     Deduct: $2,750 ($2,750 met)      Out of Pocket Max: $5,500 (includes deductible - [phone] met)      Life Max: NA CIR: 80% coverage, 20% co-insurance      SNF: 80% coverage, 20% co-insurance; limited to 60 days/cal yr Outpatient: 80% coverage, 20% co-insurance; limited to 30 combined PT/OT visits and 30 ST visits     Home Health: 80% coverage, 20% co-insurance      DME: 80% coverage, 20% co-insurance    Providers:  SECONDARY: None      Policy#:      Phone#:    Development worker, community:       Phone#:    The Engineer, petroleum" for patients in Inpatient Rehabilitation Facilities with attached "Privacy Act Sargeant Records" was provided and verbally reviewed with: N/A   Emergency Contact Information         Contact Information     Name Relation Home Work [name], Aaron Sister     [phone]    Aaron, [name] Other     [phone]          Current Medical History   Patient Admitting Diagnosis: Cardiac debility, new LVAD   History of Present Illness: Aaron [name] is a 54 year old right-handed male with history of hypertension and tobacco abuse systolic congestive heart failure who is on no prescription medications.  Per chart review patient lives alone independent prior to admission 1 level home 3 steps to entry.  He does have family in the area.  Presented 06/17/2019 to [facility] with acute onset of chest pain.  EKG in the ED showed normal sinus rhythm rate of 96 bpm ST elevation in V1-V3, aVR.  ST depressions in inferior lateral leads and Q waves in V1-V3 for diagnosis of STEMI and was taken to the cardiac Cath Lab found to have multivessel CAD.  Attempts were made to open the LAD but the wire would not cross.  Echocardiogram showed ejection fraction of 25%.  Patient was transferred to [facility].  Patient underwent LVAD implantation 06/25/2019 per Dr. Orvan [name].  Hospital course complicated by 2 episodes of VF requiring emergent DC-CV on 07/02/2019  with recurrent VF on 07/03/2019 and broke spontaneously.  He did develop sepsis 07/09/2019 pneumonia quiring intubation for a short time and extubated 07/14/2019.  Follow-up cardiology services for systolic congestive heart failure requiring midodrine.  Chronic Coumadin has been initiated.  During hospital course findings incidental of renal mass on MRI of abdomen 1.8 cm right renal lesion consistent with renal cell carcinoma.  No evidence of renal vein involvement or nodal metastasis.  Discussed with urology plan at this time requiring surveillance and would follow-up outpatient.  Palliative care has been consulted to establish goals of care.  He is tolerating a regular consistency diet.  Therapy evaluations completed and patient is to be admitted for a comprehensive rehab program on 07/23/19.   Patient's medical record from [facility] has been reviewed by the  rehabilitation admission coordinator and physician.   Past Medical History  Past Medical History:  Diagnosis Date  . Acute anterolateral wall MI ([place])    . CAD, multiple vessel    . Hypertension    . Ischemic cardiomyopathy    . Systolic heart failure ([place])    . Tobacco abuse        Family History   family history includes CAD in his father; Valvular heart disease in his father.   Prior Rehab/Hospitalizations Has the patient had prior rehab or hospitalizations prior to admission? No   Has the patient had major surgery during 100 days prior to admission? Yes              Current Medications   Current Facility-Administered Medications:  .  0.9 %  sodium chloride infusion, 250 mL, Intravenous, Continuous, [name], Aaron D, NP .  0.9 %  sodium chloride infusion, 250 mL, Intravenous, PRN, [name], Aaron D, NP, Stopped at 07/15/19 0129 .  acetaminophen (TYLENOL) tablet 650 mg, 650 mg, Oral, Q4H PRN, [name], Aaron D, NP .  ALPRAZolam (XANAX) tablet 0.25 mg, 0.25 mg, Oral, TID PRN, [name], Aaron D, NP, 0.25 mg at 07/15/19 1657 .  amiodarone (PACERONE) tablet 400 mg, 400 mg, Oral, BID, [name], Aaron D, NP, 400 mg at 07/22/19 2147 .  aspirin chewable tablet 81 mg, 81 mg, Oral, Daily, [name], Aaron D, NP, 81 mg at 07/22/19 0828 .  atorvastatin (LIPITOR) tablet 80 mg, 80 mg, Oral, Daily, [name], Aaron D, NP, 80 mg at 07/22/19 0827 .  bisacodyl (DULCOLAX) EC tablet 10 mg, 10 mg, Oral, Daily, 10 mg at 07/13/19 0827 **OR** bisacodyl (DULCOLAX) suppository 10 mg, 10 mg, Rectal, Daily, [name], Aaron D, NP, 10 mg at 07/12/19 0920 .  bisacodyl (DULCOLAX) suppository 10 mg, 10 mg, Rectal, Daily PRN, [name], Aaron D, NP, 10 mg at 06/27/19 1902 .  calcium carbonate (TUMS - dosed in mg elemental calcium) chewable tablet 200 mg of elemental calcium, 200 mg of elemental calcium, Oral, TID PRN, [name], Aaron D, NP, 200 mg of elemental calcium at 07/18/19 1629 .  chlorhexidine (PERIDEX) 0.12 % solution 15 mL, 15 mL, Mouth Rinse, BID, [name],  Aaron D, NP, 15 mL at 07/21/19 2040 .  Chlorhexidine Gluconate Cloth 2 % PADS 6 each, 6 each, Topical, Daily, [name], Aaron D, NP, 6 each at 07/22/19 0834 .  clonazePAM (KLONOPIN) tablet 0.5 mg, 0.5 mg, Oral, BID, [name], Aaron D, NP, 0.5 mg at 07/22/19 2147 .  colchicine tablet 0.6 mg, 0.6 mg, Oral, BID, [name], Aaron D, NP, 0.6 mg at 07/22/19 2147 .  dextrose 50 % solution 0-50 mL, 0-50 mL, Intravenous, PRN, [name], Aaron  D, NP .  digoxin (LANOXIN) tablet 0.125 mg, 0.125 mg, Oral, Daily, [name], Aaron D, NP, 0.125 mg at 07/22/19 0828 .  docusate sodium (COLACE) capsule 100 mg, 100 mg, Oral, Daily, [name], Aaron D, NP, 100 mg at 07/20/19 0856 .  feeding supplement (ENSURE ENLIVE) (ENSURE ENLIVE) liquid 237 mL, 237 mL, Oral, BID BM, Atkins, Glenice Bow, MD, 237 mL at 07/22/19 1211 .  fentaNYL (SUBLIMAZE) injection 50 mcg, 50 mcg, Intravenous, Q1H PRN, [name], Aaron D, NP, 50 mcg at 21/30/86 5784 .  folic acid (FOLVITE) tablet 1 mg, 1 mg, Oral, Daily, [name], Aaron D, NP, 1 mg at 07/22/19 0828 .  gabapentin (NEURONTIN) tablet 300 mg, 300 mg, Oral, TID, [name], Aaron D, NP, 300 mg at 07/22/19 2148 .  hydrocortisone cream 1 %, , Topical, TID, Bensimhon, Shaune Pascal, MD, 1 application at 69/62/95 2148 .  insulin aspart (novoLOG) injection 0-5 Units, 0-5 Units, Subcutaneous, QHS, [name], Aaron D, NP .  insulin aspart (novoLOG) injection 0-9 Units, 0-9 Units, Subcutaneous, TID WC, [name], Aaron D, NP, 1 Units at 07/22/19 1210 .  lactated ringers infusion, , Intravenous, Continuous, [name], Aaron D, NP .  magnesium oxide (MAG-OX) tablet 400 mg, 400 mg, Oral, Daily, Aaron [name], Glenice Bow, MD, 400 mg at 07/22/19 0827 .  MEDLINE mouth rinse, 15 mL, Mouth Rinse, q12n4p, [name], Aaron D, NP, 15 mL at 07/14/19 2201 .  melatonin tablet 5 mg, 5 mg, Oral, QPC supper, [name], Aaron D, NP, 5 mg at 07/22/19 2148 .  multivitamin with minerals tablet 1 tablet, 1 tablet, Oral, Daily, [name], Aaron D, NP, 1 tablet at 07/22/19 0827 .  ondansetron (ZOFRAN) injection 4 mg, 4  mg, Intravenous, Q6H PRN, [name], Aaron D, NP, 4 mg at 07/19/19 1005 .  oxyCODONE (Oxy IR/ROXICODONE) immediate release tablet 5-10 mg, 5-10 mg, Oral, Q3H PRN, [name], Aaron D, NP, 10 mg at 07/22/19 0828 .  pantoprazole (PROTONIX) EC tablet 40 mg, 40 mg, Oral, Daily, [name], Aaron D, NP, 40 mg at 07/22/19 0827 .  pneumococcal 23 valent vaccine (PNEUMOVAX-23) injection 0.5 mL, 0.5 mL, Intramuscular, Prior to discharge, Atkins, Glenice Bow, MD .  polyethylene glycol (MIRALAX / GLYCOLAX) packet 17 g, 17 g, Oral, Daily, [name], Aaron D, NP, 17 g at 07/14/19 0947 .  polyvinyl alcohol (LIQUIFILM TEARS) 1.4 % ophthalmic solution 1 drop, 1 drop, Both Eyes, PRN, [name], Aaron D, NP .  ranolazine (RANEXA) 12 hr tablet 500 mg, 500 mg, Oral, BID, [name], Aaron D, NP, 500 mg at 07/22/19 2147 .  sodium chloride flush (NS) 0.9 % injection 10-40 mL, 10-40 mL, Intracatheter, Q12H, [name], Aaron D, NP, 10 mL at 07/22/19 2151 .  sodium chloride flush (NS) 0.9 % injection 10-40 mL, 10-40 mL, Intracatheter, PRN, [name], Aaron D, NP .  sodium chloride flush (NS) 0.9 % injection 3 mL, 3 mL, Intravenous, Q12H, [name], Aaron D, NP, 3 mL at 07/22/19 2152 .  sodium chloride flush (NS) 0.9 % injection 3 mL, 3 mL, Intravenous, Q12H, [name], Aaron D, NP, 3 mL at 07/21/19 2043 .  sodium chloride flush (NS) 0.9 % injection 3 mL, 3 mL, Intravenous, PRN, [name], Aaron D, NP, 3 mL at 07/11/19 2113 .  thiamine tablet 100 mg, 100 mg, Oral, Daily, [name], Aaron D, NP, 100 mg at 07/22/19 0828 .  traMADol (ULTRAM) tablet 50-100 mg, 50-100 mg, Oral, Q4H PRN, [name], Aaron D, NP, 100 mg at 07/20/19 1707 .  traZODone (DESYREL) tablet 100 mg, 100 mg, Oral, QHS PRN, [name],  Aaron D, NP .  warfarin (COUMADIN) tablet 1 mg, 1 mg, Oral, ONCE-1600, Atkins, Glenice Bow, MD .  Warfarin - Pharmacist Dosing Inpatient, , Does not apply, q1600, [name], Aaron D, NP, Given at 07/22/19 1743   Patients Current Diet:     Diet Order                      Diet Heart Room service appropriate? Yes; Fluid  consistency: Thin  Diet effective now                      Precautions / Restrictions Precautions Precautions: Fall, Sternal Precaution Comments: LVAD Restrictions Weight Bearing Restrictions: Yes (sternal precautions) RLE Weight Bearing: Non weight bearing LLE Weight Bearing: Non weight bearing Other Position/Activity Restrictions: STERNAL PRECAUTIONS    Has the patient had 2 or more falls or a fall with injury in the past year? No   Prior Activity Level Community (5-7x/wk): worked full time as a Dealer, drove PTA, active and independent PTA   Prior Functional Level Self Care: Did the patient need help bathing, dressing, using the toilet or eating? Independent   Indoor Mobility: Did the patient need assistance with walking from room to room (with or without device)? Independent   Stairs: Did the patient need assistance with internal or external stairs (with or without device)? Independent   Functional Cognition: Did the patient need help planning regular tasks such as shopping or remembering to take medications? Independent   Home Assistive Devices / Equipment Home Assistive Devices/Equipment: Eyeglasses Home Equipment: None   Prior Device Use: Indicate devices/aids used by the patient prior to current illness, exacerbation or injury? None of the above   Current Functional Level Cognition   Overall Cognitive Status: Impaired/Different from baseline Orientation Level: Oriented X4 General Comments: pt needing assist and verbal cues to switch from battery back to wall power    Extremity Assessment (includes Sensation/Coordination)   Upper Extremity Assessment: Generalized weakness  Lower Extremity Assessment: Defer to PT evaluation     ADLs   Overall ADL's : Needs assistance/impaired Eating/Feeding: Set up, Sitting Eating/Feeding Details (indicate cue type and reason): setup with lunch tray end of session Grooming: Oral care, Standing, Supervision/safety, Wash/dry  hands, Wash/dry face, Applying deodorant, Sitting, Set up Grooming Details (indicate cue type and reason): oral care standing, face/hands/combing hair in chair Upper Body Bathing: Minimal assistance, Sitting Upper Body Bathing Details (indicate cue type and reason): assisted with back Lower Body Bathing: Supervison/ safety, Sit to/from stand Upper Body Dressing : Minimal assistance, Sitting Upper Body Dressing Details (indicate cue type and reason): assisted for LVAD vest Lower Body Dressing: Set up, Sitting/lateral leans Lower Body Dressing Details (indicate cue type and reason): donning socks Toilet Transfer: Minimal assistance, Ambulation, Comfort height toilet Toilet Transfer Details (indicate cue type and reason): pushing IV pole Toileting- Clothing Manipulation and Hygiene: Supervision/safety, Sit to/from stand Functional mobility during ADLs: Supervision/safety, Rolling walker General ADL Comments: pt tolerating OOB to chair s/p extubation on 7/13 and removal of IABP. pt now with increased weakness and unsteadiness with mobility given recent medical changes      Mobility   Overal bed mobility: Modified Independent Bed Mobility: Rolling, Sidelying to Sit Rolling: Min guard Sidelying to sit: Min guard Supine to sit: Min assist, HOB elevated Sit to supine: Min guard General bed mobility comments: to return to supine     Transfers   Overall transfer level: Needs assistance Equipment used: Rolling walker (  2 wheeled) Transfers: Sit to/from Stand Sit to Stand: Supervision Stand pivot transfers: Min assist General transfer comment: cues for hand placement     Ambulation / Gait / Stairs / Wheelchair Mobility   Ambulation/Gait Ambulation/Gait assistance: Min assist, Mod assist, Max assist Gait Distance (Feet): 240 Feet Assistive device: 1 person hand held assist, None Gait Pattern/deviations: Step-through pattern, Decreased stride length, Staggering left, Staggering right, Trunk  flexed General Gait Details: Slow, very unsteady gait without DME, pt requiring intermittent HHA and min-maxA to prevent LOB. 4x standing rest breaks secondary to c/o fatigue and dizziness; pt declining seated rest. Gait velocity: Decreased Gait velocity interpretation: <1.31 ft/sec, indicative of household ambulator     Posture / Balance Balance Overall balance assessment: Needs assistance Sitting-balance support: No upper extremity supported, Feet supported Sitting balance-Leahy Scale: Good Standing balance support: During functional activity Standing balance-Leahy Scale: Fair Standing balance comment: Can maintain static standing without UE support; reliant on UE support or external assist for dynamic stability High level balance activites: Side stepping, Direction changes, Turns, Head turns High Level Balance Comments: Postive LOB with higher level balance tasks requiring min-maxA to correct     Special needs/care consideration NEW LVAD   Skin: ecchymosis to right groin, rash to left abdomen, incision to chest from 6/24, incision to mid anterior abdomen 6/24    Special service needs: pt is a new LVAD patient   Designated visitor: sister: Rea (from acute therapy documentation) Living Arrangements: Alone Available Help at Discharge: Family, Available 24 hours/day Type of Home: House Home Layout: One level Home Access: Stairs to enter Entrance Stairs-Rails: Right, Left, Can reach both Entrance Stairs-Number of Steps: 3 Bathroom Shower/Tub: Tub/shower unit, Industrial/product designer: No Home Care Services: No   Discharge Living Setting Plans for Discharge Living Setting: House Type of Home at Discharge: House Discharge Home Layout: One level Discharge Home Access: Stairs to enter Entrance Stairs-Rails: Can reach both Entrance Stairs-Number of Steps: 2 Discharge Bathroom Shower/Tub: Tub/shower unit Discharge  Bathroom Toilet: Standard Discharge Bathroom Accessibility: Yes How Accessible: Accessible via walker (if sidesteps with RW) Does the patient have any problems obtaining your medications?: No   Social/Family/Support Systems Patient Roles: Other (Comment) (full time employee; lives alone, Doctor, general practice ) Contact Information: sister: [name] [phone] Anticipated Caregiver: sister Anticipated Caregiver's Contact Information: see above Ability/Limitations of Caregiver: supervision/Min A Caregiver Availability: 24/7 Discharge Plan Discussed with Primary Caregiver: Yes (pt and his sister) Is Caregiver In Agreement with Plan?: Yes Does Caregiver/Family have Issues with Lodging/Transportation while Pt is in Rehab?: No   Goals Patient/Family Goal for Rehab: PT/OT: Mod I; SLP: NA Expected length of stay: 8-12 days Pt/Family Agrees to Admission and willing to participate: Yes Program Orientation Provided & Reviewed with Pt/Caregiver Including Roles  & Responsibilities: Yes (pt and his sister)  Barriers to Discharge: Home environment access/layout, Other (comments)  Barriers to Discharge Comments: new LVAD; steps to enter home.    Decrease burden of Care through IP rehab admission: NA   Possible need for SNF placement upon discharge: Not anticipated; pt has good social support from his sister and anticipate pt can reach a Supervision/Mod I level after CIR stay to return home safety with his sister.    Patient Condition: I have reviewed medical records from [facility], spoken with PA, and patient and family member. I met with patient at the bedside for inpatient rehabilitation assessment.  Patient will benefit from ongoing  PT and OT, can actively participate in 3 hours of therapy a day 5 days of the week, and can make measurable gains during the admission.  Patient will also benefit from the coordinated team approach during an Inpatient Acute Rehabilitation admission.  The patient will  receive intensive therapy as well as Rehabilitation physician, nursing, social worker, and care management interventions.  Due to safety, skin/wound care, disease management, medication administration, pain management and patient education the patient requires 24 hour a day rehabilitation nursing.  The patient is currently Min, Mod, Max A for 240 feet with HHA with mobility and Supervision to [place] A for basic ADLs.  Discharge setting and therapy post discharge at home with home health is anticipated.  Patient has agreed to participate in the Acute Inpatient Rehabilitation Program and will admit 07/23/19.   Preadmission Screen Completed By:  Raechel Ache, 07/23/2019 10:56 AM ______________________________________________________________________   Discussed status with Dr. Dagoberto Ligas on 07/23/19 at 10:55AM and received approval for admission today.   Admission Coordinator:  Raechel Ache, OT, time 10:55AM/Date 07/23/19    Assessment/Plan: Diagnosis: 1. Does the need for close, 24 hr/day Medical supervision in concert with the patient's rehab needs make it unreasonable for this patient to be served in a less intensive setting? Yes 2. Co-Morbidities requiring supervision/potential complications: LVAD, on coumadin, renal cell CA, cardiogenic shock,  3. Due to bladder management, bowel management, safety, skin/wound care, disease management, medication administration, pain management and patient education, does the patient require 24 hr/day rehab nursing? Yes 4. Does the patient require coordinated care of a physician, rehab nurse, PT, OT, and SLP to address physical and functional deficits in the context of the above medical diagnosis(es)? Yes Addressing deficits in the following areas: balance, endurance, locomotion, strength, transferring, bathing, dressing, feeding, grooming and toileting 5. Can the patient actively participate in an intensive therapy program of at least 3 hrs of therapy 5 days a week?  Yes 6. The potential for patient to make measurable gains while on inpatient rehab is fair 7. Anticipated functional outcomes upon discharge from inpatient rehab: modified independent and supervision PT, modified independent and supervision OT, n/a SLP 8. Estimated rehab length of stay to reach the above functional goals is: 8-12 days 9. Anticipated discharge destination: Home 10. Overall Rehab/Functional Prognosis: fair     MD Signature:          Revision History                     Note Details  Jan Fireman, MD File Time 07/23/2019 11:01 AM  Author Type Physician Status Signed  Last Editor Aaron Heys, MD Service Physical Medicine and [place] # [id number] Admit Date 07/24/2019

## 2019-07-24 NOTE — Plan of Care (Signed)
  Problem: Education: Goal: Understanding of cardiac disease, CV risk reduction, and recovery process will improve Outcome: Progressing Goal: Understanding of medication regimen will improve Outcome: Progressing Goal: Individualized Educational Video(s) Outcome: Progressing   Problem: Activity: Goal: Ability to tolerate increased activity will improve Outcome: Progressing   Problem: Cardiac: Goal: Ability to achieve and maintain adequate cardiopulmonary perfusion will improve Outcome: Progressing   Problem: Health Behavior/Discharge Planning: Goal: Ability to safely manage health-related needs after discharge will improve Outcome: Progressing   Problem: Education: Goal: Knowledge of General Education information will improve Description: Including pain rating scale, medication(s)/side effects and non-pharmacologic comfort measures Outcome: Progressing   Problem: Health Behavior/Discharge Planning: Goal: Ability to manage health-related needs will improve Outcome: Progressing   Problem: Clinical Measurements: Goal: Ability to maintain clinical measurements within normal limits will improve Outcome: Progressing Goal: Will remain free from infection Outcome: Progressing Goal: Diagnostic test results will improve Outcome: Progressing Goal: Respiratory complications will improve Outcome: Progressing Goal: Cardiovascular complication will be avoided Outcome: Progressing   Problem: Activity: Goal: Risk for activity intolerance will decrease Outcome: Progressing   Problem: Nutrition: Goal: Adequate nutrition will be maintained Outcome: Progressing   Problem: Coping: Goal: Level of anxiety will decrease Outcome: Progressing   Problem: Elimination: Goal: Will not experience complications related to bowel motility Outcome: Progressing Goal: Will not experience complications related to urinary retention Outcome: Progressing   Problem: Pain Managment: Goal: General  experience of comfort will improve Outcome: Progressing   Problem: Safety: Goal: Ability to remain free from injury will improve Outcome: Progressing   Problem: Skin Integrity: Goal: Risk for impaired skin integrity will decrease Outcome: Progressing   Problem: Education: Goal: Knowledge of the prescribed therapeutic regimen will improve Outcome: Progressing   Problem: Activity: Goal: Risk for activity intolerance will decrease Outcome: Progressing   Problem: Cardiac: Goal: Ability to maintain an adequate cardiac output will improve Outcome: Progressing   Problem: Coping: Goal: Level of anxiety will decrease Outcome: Progressing   Problem: Fluid Volume: Goal: Risk for excess fluid volume will decrease Outcome: Progressing   Problem: Clinical Measurements: Goal: Ability to maintain clinical measurements within normal limits will improve Outcome: Progressing Goal: Will remain free from infection Outcome: Progressing   Problem: Respiratory: Goal: Will regain and/or maintain adequate ventilation Outcome: Progressing   Problem: Education: Goal: Knowledge of the prescribed therapeutic regimen will improve Outcome: Progressing   Problem: Activity: Goal: Risk for activity intolerance will decrease Outcome: Progressing   Problem: Cardiac: Goal: Ability to maintain an adequate cardiac output will improve Outcome: Progressing   Problem: Coping: Goal: Level of anxiety will decrease Outcome: Progressing   Problem: Fluid Volume: Goal: Risk for excess fluid volume will decrease Outcome: Progressing   Problem: Clinical Measurements: Goal: Ability to maintain clinical measurements within normal limits will improve Outcome: Progressing Goal: Will remain free from infection Outcome: Progressing   Problem: Respiratory: Goal: Will regain and/or maintain adequate ventilation Outcome: Progressing

## 2019-07-24 NOTE — Progress Notes (Signed)
[facility] for Coumadin  Indication: LVAD  Allergies  Allergen Reactions  . Codeine Hives    Patient Measurements: Height: 5\' 9"  (175.3 cm) Weight: 83.2 kg (183 lb 6.8 oz) IBW/kg (Calculated) : 70.7 Heparin Dosing Weight: ~ 92 kg  Vital Signs: Temp: 97.8 F (36.6 C) (07/23 0820) Temp Source: Oral (07/23 0820) BP: 105/88 (07/23 0820) Pulse Rate: 71 (07/23 0820)  Labs: Recent Labs    07/22/19 0420 07/22/19 0420 07/23/19 0243 07/24/19 0302  HGB 12.1*   < > 12.2* 11.8*  HCT 39.2  --  39.3 38.0*  PLT 268  --  285 259  LABPROT 20.2*  --  25.9* 31.7*  INR 1.8*  --  2.5* 3.2*  CREATININE 1.26*  --  1.18 1.09   < > = values in this interval not displayed.    Estimated Creatinine Clearance: 76.6 mL/min (by C-G formula based on SCr of 1.09 mg/dL).  Assessment: 54 yo male s/p LVAD HM3 placement 6/24.  Pharmacy asked to dose anticoagulation with IV heparin and Coumadin. No overt bleeding or complications noted.    IABP 7/8>7/13   Therapeutic INR 1.8>2.5> 3 today after dose increases and not eating as much last few days - previously sensitive to inc warfarin doses and had been conservative Hgb stable 10-11, plt 200s. LDH down to 200s. Pacing wires removed 7/20, heparin stopped 7/21 with INR at goal Rash on abdomen under DL dressing - improved with hydrocortisone cream as need  Goal of Therapy:  INR 2-2.5  Monitor platelets by anticoagulation protocol: Yes   Plan:  -No Warfarin today -  -limit ensure max 1/day in rehab  -Daily INR, CBC, LDH, s/sx of bleeding, HL  Bonnita Nasuti Pharm.D. CPP, BCPS Clinical Pharmacist [phone] 07/24/2019 11:50 AM    Please check [email or web address] for unit-specific pharmacist phone numbers

## 2019-07-24 NOTE — Progress Notes (Signed)
LVAD Coordinator Rounding Note:  Transferred from [facility] ED on 06/17/19 with STEMI. Cath revealed multi-vessel CAD, wire would not cross to open LAD; IABP placed and pt transferred here for evaluation for CABG.   Dr. Orvan Seen evaluated and found he was not amenable to surgical or percutaneous revascularization due to poor targets. With severe depressed LV function and little coronary perfusion, LVAD was recommended.   HM III LVAD implanted on 06/25/19 by Dr. Orvan Seen under Destination Therapy criteria due to recent smoking history.  Developed sepsis/PNA with respiratory failure. Had refractory VT/VF. IABP/swan placed 07/09/19 - discontinued 07/14/19  Pt's transfer to CIR delayed yesterday due to patient vomiting.   Pt sitting up in bed, says he just finished breakfast and had been up in chair for about an hour. Pt denies any further N&V; reports he thinks it was from "taking a handful of pills" at once. Admits to a "few" dizzy spells. Does admit to not drinking much fluid; encouraged him to start drinking at least 2 liters/day, explaining th pump requires volume to work properly. Pt says he will try. He is not eating much because of decreased appetite and "the food here". Encouraged to eat high protein as much as possible to promote wound healing and improved strength.   Vital signs: Temp: 97.6 HR: 70 SR  Auto BP:  105/88 (95) Doppler:  88 O2 Sat: 97% on RA Wt:207>225.7>222.6>214.3.....193.7>187.3>184.3>184.3>184.3>183.4 lbs  LVAD parameters: Speed:  5200 Flow: 4.3 Power:  3.7w PI: 3.3 Hct: 38  Alarms: none Events: 12 PI events on 07/23/19   Fixed speed: 5200 Low speed limit: 4900   Drive Line:  Existing VAD dressing removed and site care performed using sterile technique. Drive line exit site cleaned with betadine swabs x 2 and rinsed with sterile saline, allowed to dry, and gauze dressing WITHOUT calcium aliginate or silver strip re-applied. No skin prep used to decrease  chance of skin irritation.Exit site unincorporated, erythema around exit site improving. Small amount yellow drainage with no foul odor noted.  The velour is fully implanted at exit site. Had to switch back to [employer]ion foley anchor for DL stability; alternative anchor would not stick/stay on patient.  Advance to every other day dressing changes per VAD Coordinator, Nurse Davonna Belling, or trained caregiver. Next dressing change due: 07/26/19.    Labs:  LDH trend: 660>398........375>338>320>317>257>257>244>256>233>194  INR trend: 1.4>1.3.....1.5>1.3>1.3>1.4>1.8>2.5>3.2  Anticoagulation Plan: -INR Goal:  2.0 - 2.5 -ASA Dose: 81 mg   Blood Products:  -Intra-op - 06/25/19>>6 units FFP  - 07/09/19>>2 FFP - 07/10/19>>2 FFP  Device: N/A  Drips: None  Infection: - 07/09/19 - BCs>>negative - 07/09/19 - resp culture>>negative - 07/21/19 - DL wound culture>>negative  Arrythmias: was on Amiodarone pre implant for NSVT - 2 episodes of VF requiring emergent DC-CV on 07/02/19 - Recurrent VF on 7/2 and broke spontaneously.  - 7/3 Recurrent VF at 2a.-> emergent DC-CV.  - 07/07/2019 VT/Torsades -> repeat DC_CV - 07/08/2019 VF/Torsades x2--> DC-CV x2.  - 07/09/19 Multiple episodes of VT/VF -> IABP placed. AV pacing via external pacing wires - underlying rhythm bradycardia 40 bpm; external AV pacing wires remain intact  Respiratory: extubated 06/26/19 - re-intubated 07/09/19 for sepsis/pneumoni - extubated 07/14/19  Nitric Oxide: off 06/26/19  - Restarted 07/10/19 - Off 07/14/19  VAD Education: 1. Sister-Joanne is checked off to perform driveline dressing changes independently.  2. Discharge education completed 07/21/19 with patient, [name], and Otila Kluver.    Plan/Recommendations:  1. Call VAD pager if any VAD equipment or drive  line issues. 2. Every other day dressing changes per VAD Coordinator, Nurse Davonna Belling, or pts sister.  Zada Girt RN [place] Coordinator  Office: [phone]  24/7 Pager: [phone]

## 2019-07-24 NOTE — Plan of Care (Signed)
Problem: Education: Goal: Understanding of cardiac disease, CV risk reduction, and recovery process will improve 07/24/2019 1449 by Don Perking, RN Outcome: Not Met (add Reason) 07/24/2019 1107 by Don Perking, RN Outcome: Progressing 07/24/2019 1056 by Don Perking, RN Outcome: Progressing Goal: Understanding of medication regimen will improve 07/24/2019 1449 by Don Perking, RN Outcome: Not Met (add Reason) 07/24/2019 1107 by Don Perking, RN Outcome: Progressing 07/24/2019 1056 by Don Perking, RN Outcome: Progressing Goal: Individualized Educational Video(s) 07/24/2019 1449 by Don Perking, RN Outcome: Not Met (add Reason) 07/24/2019 1107 by Don Perking, RN Outcome: Progressing 07/24/2019 1056 by Don Perking, RN Outcome: Progressing   Problem: Activity: Goal: Ability to tolerate increased activity will improve 07/24/2019 1449 by Don Perking, RN Outcome: Not Met (add Reason) 07/24/2019 1107 by Don Perking, RN Outcome: Progressing 07/24/2019 1056 by Don Perking, RN Outcome: Progressing   Problem: Cardiac: Goal: Ability to achieve and maintain adequate cardiopulmonary perfusion will improve 07/24/2019 1449 by Don Perking, RN Outcome: Not Met (add Reason) 07/24/2019 1107 by Don Perking, RN Outcome: Progressing 07/24/2019 1056 by Don Perking, RN Outcome: Progressing   Problem: Health Behavior/Discharge Planning: Goal: Ability to safely manage health-related needs after discharge will improve 07/24/2019 1449 by Don Perking, RN Outcome: Not Met (add Reason) 07/24/2019 1107 by Don Perking, RN Outcome: Progressing 07/24/2019 1056 by Don Perking, RN Outcome: Progressing   Problem: Education: Goal: Knowledge of General Education information will improve Description: Including pain rating scale, medication(s)/side effects and non-pharmacologic comfort measures 07/24/2019  1449 by Don Perking, RN Outcome: Not Met (add Reason) 07/24/2019 1107 by Don Perking, RN Outcome: Progressing 07/24/2019 1056 by Don Perking, RN Outcome: Progressing   Problem: Health Behavior/Discharge Planning: Goal: Ability to manage health-related needs will improve 07/24/2019 1449 by Don Perking, RN Outcome: Not Met (add Reason) 07/24/2019 1107 by Don Perking, RN Outcome: Progressing 07/24/2019 1056 by Don Perking, RN Outcome: Progressing   Problem: Clinical Measurements: Goal: Ability to maintain clinical measurements within normal limits will improve 07/24/2019 1449 by Don Perking, RN Outcome: Not Met (add Reason) 07/24/2019 1107 by Don Perking, RN Outcome: Progressing 07/24/2019 1056 by Don Perking, RN Outcome: Progressing Goal: Will remain free from infection 07/24/2019 1449 by Don Perking, RN Outcome: Not Met (add Reason) 07/24/2019 1107 by Don Perking, RN Outcome: Progressing 07/24/2019 1056 by Don Perking, RN Outcome: Progressing Goal: Diagnostic test results will improve 07/24/2019 1449 by Don Perking, RN Outcome: Not Met (add Reason) 07/24/2019 1107 by Don Perking, RN Outcome: Progressing 07/24/2019 1056 by Don Perking, RN Outcome: Progressing Goal: Respiratory complications will improve 07/24/2019 1449 by Don Perking, RN Outcome: Not Met (add Reason) 07/24/2019 1107 by Don Perking, RN Outcome: Progressing 07/24/2019 1056 by Don Perking, RN Outcome: Progressing Goal: Cardiovascular complication will be avoided 07/24/2019 1449 by Don Perking, RN Outcome: Not Met (add Reason) 07/24/2019 1107 by Don Perking, RN Outcome: Progressing 07/24/2019 1056 by Don Perking, RN Outcome: Progressing   Problem: Activity: Goal: Risk for activity intolerance will decrease 07/24/2019 1449 by Don Perking, RN Outcome: Not Met (add  Reason) 07/24/2019 1107 by Don Perking, RN Outcome: Progressing 07/24/2019 1056 by Don Perking, RN Outcome: Progressing   Problem: Nutrition: Goal: Adequate nutrition will be maintained 07/24/2019 1449 by Don Perking, RN Outcome: Not Met (add  Reason) 07/24/2019 1107 by Don Perking, RN Outcome: Progressing 07/24/2019 1056 by Don Perking, RN Outcome: Progressing   Problem: Coping: Goal: Level of anxiety will decrease 07/24/2019 1449 by Don Perking, RN Outcome: Not Met (add Reason) 07/24/2019 1107 by Don Perking, RN Outcome: Progressing 07/24/2019 1056 by Don Perking, RN Outcome: Progressing   Problem: Elimination: Goal: Will not experience complications related to bowel motility 07/24/2019 1449 by Don Perking, RN Outcome: Not Met (add Reason) 07/24/2019 1107 by Don Perking, RN Outcome: Progressing 07/24/2019 1056 by Don Perking, RN Outcome: Progressing Goal: Will not experience complications related to urinary retention 07/24/2019 1449 by Don Perking, RN Outcome: Not Met (add Reason) 07/24/2019 1107 by Don Perking, RN Outcome: Progressing 07/24/2019 1056 by Don Perking, RN Outcome: Progressing   Problem: Pain Managment: Goal: General experience of comfort will improve 07/24/2019 1449 by Don Perking, RN Outcome: Not Met (add Reason) 07/24/2019 1107 by Don Perking, RN Outcome: Progressing 07/24/2019 1056 by Don Perking, RN Outcome: Progressing   Problem: Safety: Goal: Ability to remain free from injury will improve 07/24/2019 1449 by Don Perking, RN Outcome: Not Met (add Reason) 07/24/2019 1107 by Don Perking, RN Outcome: Progressing 07/24/2019 1056 by Don Perking, RN Outcome: Progressing   Problem: Skin Integrity: Goal: Risk for impaired skin integrity will decrease 07/24/2019 1449 by Don Perking, RN Outcome: Not Met (add  Reason) 07/24/2019 1107 by Don Perking, RN Outcome: Progressing 07/24/2019 1056 by Don Perking, RN Outcome: Progressing   Problem: Education: Goal: Knowledge of the prescribed therapeutic regimen will improve 07/24/2019 1449 by Don Perking, RN Outcome: Not Met (add Reason) 07/24/2019 1107 by Don Perking, RN Outcome: Progressing 07/24/2019 1056 by Don Perking, RN Outcome: Progressing   Problem: Activity: Goal: Risk for activity intolerance will decrease 07/24/2019 1449 by Don Perking, RN Outcome: Not Met (add Reason) 07/24/2019 1107 by Don Perking, RN Outcome: Progressing 07/24/2019 1056 by Don Perking, RN Outcome: Progressing   Problem: Cardiac: Goal: Ability to maintain an adequate cardiac output will improve 07/24/2019 1449 by Don Perking, RN Outcome: Not Met (add Reason) 07/24/2019 1107 by Don Perking, RN Outcome: Progressing 07/24/2019 1056 by Don Perking, RN Outcome: Progressing   Problem: Coping: Goal: Level of anxiety will decrease 07/24/2019 1449 by Don Perking, RN Outcome: Not Met (add Reason) 07/24/2019 1107 by Don Perking, RN Outcome: Progressing 07/24/2019 1056 by Don Perking, RN Outcome: Progressing   Problem: Fluid Volume: Goal: Risk for excess fluid volume will decrease 07/24/2019 1449 by Don Perking, RN Outcome: Not Met (add Reason) 07/24/2019 1107 by Don Perking, RN Outcome: Progressing 07/24/2019 1056 by Don Perking, RN Outcome: Progressing   Problem: Clinical Measurements: Goal: Ability to maintain clinical measurements within normal limits will improve 07/24/2019 1449 by Don Perking, RN Outcome: Not Met (add Reason) 07/24/2019 1107 by Don Perking, RN Outcome: Progressing 07/24/2019 1056 by Don Perking, RN Outcome: Progressing Goal: Will remain free from infection 07/24/2019 1449 by Don Perking, RN Outcome: Not  Met (add Reason) 07/24/2019 1107 by Don Perking, RN Outcome: Progressing 07/24/2019 1056 by Don Perking, RN Outcome: Progressing   Problem: Respiratory: Goal: Will regain and/or maintain adequate ventilation 07/24/2019 1449 by Don Perking, RN Outcome: Not Met (add Reason) 07/24/2019 1107 by Don Perking, RN Outcome: Progressing 07/24/2019 1056 by Vickki Muff,  Myrtis Hopping, RN Outcome: Progressing   Problem: Education: Goal: Knowledge of the prescribed therapeutic regimen will improve 07/24/2019 1449 by Don Perking, RN Outcome: Not Met (add Reason) 07/24/2019 1107 by Don Perking, RN Outcome: Progressing 07/24/2019 1056 by Don Perking, RN Outcome: Progressing   Problem: Activity: Goal: Risk for activity intolerance will decrease 07/24/2019 1449 by Don Perking, RN Outcome: Not Met (add Reason) 07/24/2019 1107 by Don Perking, RN Outcome: Progressing 07/24/2019 1056 by Don Perking, RN Outcome: Progressing   Problem: Cardiac: Goal: Ability to maintain an adequate cardiac output will improve 07/24/2019 1449 by Don Perking, RN Outcome: Not Met (add Reason) 07/24/2019 1107 by Don Perking, RN Outcome: Progressing 07/24/2019 1056 by Don Perking, RN Outcome: Progressing   Problem: Coping: Goal: Level of anxiety will decrease 07/24/2019 1449 by Don Perking, RN Outcome: Not Met (add Reason) 07/24/2019 1107 by Don Perking, RN Outcome: Progressing 07/24/2019 1056 by Don Perking, RN Outcome: Progressing   Problem: Fluid Volume: Goal: Risk for excess fluid volume will decrease 07/24/2019 1449 by Don Perking, RN Outcome: Not Met (add Reason) 07/24/2019 1107 by Don Perking, RN Outcome: Progressing 07/24/2019 1056 by Don Perking, RN Outcome: Progressing   Problem: Clinical Measurements: Goal: Ability to maintain clinical measurements within normal limits will  improve 07/24/2019 1449 by Don Perking, RN Outcome: Not Met (add Reason) 07/24/2019 1107 by Don Perking, RN Outcome: Progressing 07/24/2019 1056 by Don Perking, RN Outcome: Progressing Goal: Will remain free from infection 07/24/2019 1449 by Don Perking, RN Outcome: Not Met (add Reason) 07/24/2019 1107 by Don Perking, RN Outcome: Progressing 07/24/2019 1056 by Don Perking, RN Outcome: Progressing   Problem: Respiratory: Goal: Will regain and/or maintain adequate ventilation 07/24/2019 1449 by Don Perking, RN Outcome: Not Met (add Reason) 07/24/2019 1107 by Don Perking, RN Outcome: Progressing 07/24/2019 1056 by Don Perking, RN Outcome: Progressing  Going to inpatient rehab

## 2019-07-24 NOTE — Progress Notes (Signed)
Inpatient Rehabilitation Medication Review by a Pharmacist  A complete drug regimen review was completed for this patient to identify any potential clinically significant medication issues.  Clinically significant medication issues were identified:  yes   Type of Medication Issue Identified Description of Issue Urgent (address now) Non-Urgent (address on AM team rounds) Plan Plan Accepted by Provider? (Yes / No / Pending AM Rounds)  Drug Interaction(s) (clinically significant)       Duplicate Therapy       Allergy       No Medication Administration End Date       Incorrect Dose       Additional Drug Therapy Needed       Other  Revatio 20 mg TID not reordered. Wafarin 1 mg dose ordered for today Urgent Urgent PA-C notified and Revatio reordered. Warfarin dose dc'd based on pharmacy notes per protocol. Yes.  Yes.    Name of provider notified for urgent issues identified: Lauraine Rinne, PA-C.  Provider Method of Notification: phone call  Time spent performing this drug regimen review (minutes):  30 min.  Thank you for letting me be a part of this patient's care.  Blenda Nicely, [place] 07/24/2019 4:57 PM

## 2019-07-24 NOTE — Progress Notes (Signed)
Report called pt going to [id number] via w/c and belongings for rehab

## 2019-07-24 NOTE — H&P (Signed)
Physical Medicine and Rehabilitation Admission H&P     HPI: Aaron [name] is a 54 year old right-handed male with history of hypertension and tobacco abuse systolic congestive heart failure who is on no prescription medications.  History taken from chart review and patient.  Per chart review patient lives alone independent prior to admission 1 level home 3 steps to entry.  He does have family in the area.  He presented on 06/17/2019 to OSH, [facility] with acute onset of chest pain.  EKG in the ED showed normal sinus rhythm with a rate of 96 bpm ST elevation in V1-V3, aVR.  ST depressions in inferior lateral leads and Q waves in V1-V3 for diagnosis of STEMI and was taken to the cardiac Cath Lab found to have multilevel CAD.  Attempts were made to open the LAD but the wire would not cross.  Echocardiogram with ejection fraction of 25%.  Patient was transferred to [facility].  Patient underwent LVAD implantation 06/25/2019 per Dr. Orvan Seen.  Hospital course complicated by 2 episodes of ventricular fibrillation requiring emergent DC-CV on 07/02/2019 with recurrent VF on 07/03/2019 and broke spontaneously.  Hospital course further complicated by sepsis on 07/09/2019 due to pneumonia requiring intubation for a short time and extubated 07/14/2019.  Follow-up cardiology services for systolic congestive heart failure requiring midodrine.  Chronic Coumadin has been initiated.  During hospital course findings incidental of renal mass on MRI of abdomen 1.8 cm right renal lesion consistent with renal cell carcinoma.  No evidence of renal vein involvement or nodal metastasis.  Discussed with urology plan at this time requiring surveillance and would follow-up as outpatient.  Palliative care has been consulted to establish goals of care.  Patient did have an episode of vomiting on 07/23/2019 improved with the use of Zofran.  He is tolerating a regular consistency diet.  Therapy evaluations completed and patient  was admitted for a comprehensive rehab program.  Please see preadmission assessment from earlier today as well.  Review of Systems  Constitutional: Positive for malaise/fatigue. Negative for chills and fever.  HENT: Negative for hearing loss.   Eyes: Negative for blurred vision and double vision.  Respiratory: Positive for shortness of breath. Negative for cough.   Cardiovascular: Positive for leg swelling.  Gastrointestinal: Positive for constipation and nausea. Negative for heartburn and vomiting.  Genitourinary: Negative for dysuria, flank pain and hematuria.  Musculoskeletal: Positive for joint pain and myalgias.  Skin: Negative for rash.  Neurological: Positive for weakness.  All other systems reviewed and are negative.  Past Medical History:  Diagnosis Date  . Acute anterolateral wall MI ([place])   . CAD, multiple vessel   . Hypertension   . Ischemic cardiomyopathy   . Systolic heart failure ([place])   . Tobacco abuse    Past Surgical History:  Procedure Laterality Date  . CARDIOVERSION N/A 07/02/2019   Procedure: CARDIOVERSION;  Surgeon: Jolaine Artist, MD;  Location: [facility] OR;  Service: Cardiovascular;  Laterality: N/A;  . CORONARY/GRAFT ACUTE MI REVASCULARIZATION N/A 06/17/2019   Procedure: Coronary/Graft Acute MI Revascularization;  Surgeon: Yolonda Kida, MD;  Location: [place] CV LAB;  Service: Cardiovascular;  Laterality: N/A;  . IABP INSERTION N/A 06/17/2019   Procedure: IABP Insertion;  Surgeon: Yolonda Kida, MD;  Location: [place] CV LAB;  Service: Cardiovascular;  Laterality: N/A;  . IABP INSERTION N/A 06/24/2019   Procedure: IABP INSERTION;  Surgeon: Jolaine Artist, MD;  Location: [place] [facility] LAB;  Service: Cardiovascular;  Laterality: N/A;  . IABP INSERTION N/A 07/09/2019   Procedure: IABP Insertion;  Surgeon: Jolaine Artist, MD;  Location: [place] CV LAB;  Service: Cardiovascular;  Laterality: N/A;  . INSERTION OF IMPLANTABLE LEFT  VENTRICULAR ASSIST DEVICE N/A 06/25/2019   Procedure: INSERTION OF IMPLANTABLE LEFT VENTRICULAR ASSIST DEVICE USING THORATEC HEARTMATE 3;  Surgeon: Wonda Olds, MD;  Location: [place];  Service: Open Heart Surgery;  Laterality: N/A;  . LEFT HEART CATH AND CORONARY ANGIOGRAPHY N/A 06/17/2019   Procedure: LEFT HEART CATH AND CORONARY ANGIOGRAPHY;  Surgeon: Yolonda Kida, MD;  Location: [place] CV LAB;  Service: Cardiovascular;  Laterality: N/A;  . RIGHT HEART CATH N/A 06/17/2019   Procedure: RIGHT HEART CATH;  Surgeon: Yolonda Kida, MD;  Location: [place] CV LAB;  Service: Cardiovascular;  Laterality: N/A;  . RIGHT HEART CATH N/A 06/24/2019   Procedure: RIGHT HEART CATH;  Surgeon: Jolaine Artist, MD;  Location: [place] [facility];  Service: Cardiovascular;  Laterality: N/A;  . RIGHT HEART CATH N/A 07/09/2019   Procedure: RIGHT HEART CATH;  Surgeon: Jolaine Artist, MD;  Location: [place] CV LAB;  Service: Cardiovascular;  Laterality: N/A;  . TEE WITHOUT CARDIOVERSION N/A 06/25/2019   Procedure: TRANSESOPHAGEAL ECHOCARDIOGRAM (TEE);  Surgeon: Wonda Olds, MD;  Location: [place];  Service: Open Heart Surgery;  Laterality: N/A;   Family History  Problem Relation Age of Onset  . CAD Father   . Valvular heart disease Father    Social History:  reports that he has been smoking. He has a 45.00 pack-year smoking history. He has never used smokeless tobacco. He reports current alcohol use of about 7.0 - 14.0 standard drinks of alcohol per week. He reports that he does not use drugs. Allergies:  Allergies  Allergen Reactions  . Codeine Hives   Medications Prior to Admission  Medication Sig Dispense Refill  . amiodarone (PACERONE) 400 MG tablet Take 1 tablet (400 mg total) by mouth 2 (two) times daily.    Marland Kitchen aspirin 81 MG chewable tablet Chew 1 tablet (81 mg total) by mouth daily.    Marland Kitchen atorvastatin (LIPITOR) 80 MG tablet Take 1 tablet (80 mg total) by mouth daily.     . digoxin (LANOXIN) 0.125 MG tablet Take 1 tablet (0.125 mg total) by mouth daily.    . folic acid (FOLVITE) 1 MG tablet Take 1 tablet (1 mg total) by mouth daily.    Marland Kitchen gabapentin (NEURONTIN) 600 MG tablet Take 0.5 tablets (300 mg total) by mouth 3 (three) times daily.    . hydrocortisone cream 1 % Apply topically 3 (three) times daily. 30 g 0  . magnesium oxide (MAG-OX) 400 (241.3 Mg) MG tablet Take 1 tablet (400 mg total) by mouth daily.    . pantoprazole (PROTONIX) 40 MG tablet Take 1 tablet (40 mg total) by mouth daily.    . ranolazine (RANEXA) 500 MG 12 hr tablet Take 1 tablet (500 mg total) by mouth 2 (two) times daily.    . sildenafil (REVATIO) 20 MG tablet Take 1 tablet (20 mg total) by mouth 3 (three) times daily. 10 tablet 0  . thiamine 100 MG tablet Take 1 tablet (100 mg total) by mouth daily.    Marland Kitchen warfarin (COUMADIN) 1 MG tablet Take 1 tablet (1 mg total) by mouth one time only at 4 PM.      Drug Regimen Review Drug regimen was reviewed and remains appropriate with no significant issues identified  Home: Home Living Family/patient expects to be discharged to:: Private residence Living Arrangements: Alone Available Help at Discharge: Family, Available 24 hours/day Type of Home: House Home Access: Stairs to enter [employer] of Steps: 3 Entrance Stairs-Rails: Right, Left, Can reach both Home Layout: One level Bathroom Shower/Tub: Tub/shower unit, Architectural technologist: Standard Bathroom Accessibility: No Home Equipment: None   Functional History: Prior Function Level of Independence: Independent Comments: works in Education officer, museum; occasionally wears glasses for reading; has a Insurance claims handler; enjoys Sport and exercise psychologist Status:  Mobility: Bed Mobility Overal bed mobility: Modified Independent Bed Mobility: Rolling, Sidelying to Sit Rolling: Min guard Sidelying to sit: Min guard Supine to sit: Min assist, HOB elevated Sit to supine: Min  guard General bed mobility comments: to return to supine Transfers Overall transfer level: Needs assistance Equipment used: Rolling walker (2 wheeled) Transfers: Sit to/from Stand Sit to Stand: Supervision Stand pivot transfers: Min assist General transfer comment: cues for hand placement Ambulation/Gait Ambulation/Gait assistance: Min assist, Mod assist, Max assist Gait Distance (Feet): 240 Feet Assistive device: 1 person hand held assist, None Gait Pattern/deviations: Step-through pattern, Decreased stride length, Staggering left, Staggering right, Trunk flexed General Gait Details: Slow, very unsteady gait without DME, pt requiring intermittent HHA and min-maxA to prevent LOB. 4x standing rest breaks secondary to c/o fatigue and dizziness; pt declining seated rest. Gait velocity: Decreased Gait velocity interpretation: <1.31 ft/sec, indicative of household ambulator    ADL: ADL Overall ADL's : Needs assistance/impaired Eating/Feeding: Set up, Sitting Eating/Feeding Details (indicate cue type and reason): setup with lunch tray end of session Grooming: Oral care, Standing, Supervision/safety, Wash/dry hands, Wash/dry face, Applying deodorant, Sitting, Set up Grooming Details (indicate cue type and reason): oral care standing, face/hands/combing hair in chair Upper Body Bathing: Minimal assistance, Sitting Upper Body Bathing Details (indicate cue type and reason): assisted with back Lower Body Bathing: Supervison/ safety, Sit to/from stand Upper Body Dressing : Minimal assistance, Sitting Upper Body Dressing Details (indicate cue type and reason): assisted for LVAD vest Lower Body Dressing: Set up, Sitting/lateral leans Lower Body Dressing Details (indicate cue type and reason): donning socks Toilet Transfer: Minimal assistance, Ambulation, Comfort height toilet Toilet Transfer Details (indicate cue type and reason): pushing IV pole Toileting- Clothing Manipulation and Hygiene:  Supervision/safety, Sit to/from stand Functional mobility during ADLs: Supervision/safety, Rolling walker General ADL Comments: pt tolerating OOB to chair s/p extubation on 7/13 and removal of IABP. pt now with increased weakness and unsteadiness with mobility given recent medical changes   Cognition: Cognition Overall Cognitive Status: Impaired/Different from baseline Orientation Level: Oriented X4 Cognition Arousal/Alertness: Awake/alert Behavior During Therapy: WFL for tasks assessed/performed, Flat affect Overall Cognitive Status: Impaired/Different from baseline Area of Impairment: Problem solving, Memory Memory: Decreased recall of precautions Problem Solving: Difficulty sequencing (some difficulty with managing LVAD equipment) General Comments: pt needing assist and verbal cues to switch from battery back to wall power  Physical Exam: Blood pressure 104/84, pulse 70, temperature 97.6 F (36.4 C), temperature source Oral, resp. rate 20, height _0  (1.753 m), weight 83.2 kg, SpO2 97 %. Physical Exam Vitals reviewed.  Constitutional:      General: He is not in acute distress.    Appearance: He is normal weight.  HENT:     Head: Normocephalic and atraumatic.     Right Ear: External ear normal.     Left Ear: External ear normal.     Nose: Nose normal.  Eyes:     General:  Right eye: No discharge.        Left eye: No discharge.     Extraocular Movements: Extraocular movements intact.  Cardiovascular:     Rate and Rhythm: Normal rate and regular rhythm.     Comments: + LVAD. Pulmonary:     Effort: Pulmonary effort is normal. No respiratory distress.     Breath sounds: Normal breath sounds. No stridor.  Abdominal:     General: Bowel sounds are normal.     Palpations: Abdomen is soft.  Musculoskeletal:     Cervical back: Normal range of motion and neck supple.     Comments: No edema or tenderness in extremities  Skin:    General: Skin is warm and dry.      Comments: Midline chest incision clean and dry  Neurological:     Mental Status: He is alert.     Comments: Patient is alert.   Oriented x3 and follows commands Motor: 4+/5 grossly throughout  Psychiatric:        Mood and Affect: Affect is flat.        Behavior: Behavior normal.     Results for orders placed or performed during the hospital encounter of 06/17/19 (from the past 48 hour(s))  Glucose, capillary     Status: Abnormal   Collection Time: 07/22/19  4:43 PM  Result Value Ref Range   Glucose-Capillary 118 (H) 70 - 99 mg/dL    Comment: Glucose reference range applies only to samples taken after fasting for at least 8 hours.   Comment 1 Notify RN    Comment 2 Document in Chart   Glucose, capillary     Status: Abnormal   Collection Time: 07/22/19  9:27 PM  Result Value Ref Range   Glucose-Capillary 103 (H) 70 - 99 mg/dL    Comment: Glucose reference range applies only to samples taken after fasting for at least 8 hours.   Comment 1 Notify RN    Comment 2 Document in Chart   Brain natriuretic peptide     Status: Abnormal   Collection Time: 07/23/19  2:43 AM  Result Value Ref Range   B Natriuretic Peptide 1,811.9 (H) 0.0 - 100.0 pg/mL    Comment: Performed at [name] [address], [place], [place] [address]  Lactate dehydrogenase     Status: Abnormal   Collection Time: 07/23/19  2:43 AM  Result Value Ref Range   LDH 233 (H) 98 - 192 U/L    Comment: Performed at [facility], [place] [address], [place], [place] [address]  Protime-INR     Status: Abnormal   Collection Time: 07/23/19  2:43 AM  Result Value Ref Range   Prothrombin Time 25.9 (H) 11.4 - 15.2 seconds   INR 2.5 (H) 0.8 - 1.2    Comment: (NOTE) INR goal varies based on device and disease states. Performed at [facility], [place] [address] [place], [place] [address]   Magnesium     Status: None   Collection Time: 07/23/19  2:43 AM  Result Value Ref Range   Magnesium 2.0 1.7 - 2.4 mg/dL     Comment: Performed at [facility] [address], [place] [address]  CBC     Status: Abnormal   Collection Time: 07/23/19  2:43 AM  Result Value Ref Range   WBC 8.9 4.0 - 10.5 K/uL   RBC 4.20 (L) 4.22 - 5.81 MIL/uL   Hemoglobin 12.2 (L) 13.0 -  17.0 g/dL   HCT 39.3 39 - 52 %   MCV 93.6 80.0 - 100.0 fL   MCH 29.0 26.0 - 34.0 pg   MCHC 31.0 30.0 - 36.0 g/dL   RDW 16.3 (H) 11.5 - 15.5 %   Platelets 285 150 - 400 K/uL   nRBC 0.0 0.0 - 0.2 %    Comment: Performed at [facility] [address], [place], [place] [address]  Basic metabolic panel     Status: Abnormal   Collection Time: 07/23/19  2:43 AM  Result Value Ref Range   Sodium 134 (L) 135 - 145 mmol/L   Potassium 4.2 3.5 - 5.1 mmol/L   Chloride 97 (L) 98 - 111 mmol/L   CO2 27 22 - 32 mmol/L   Glucose, Bld 105 (H) 70 - 99 mg/dL    Comment: Glucose reference range applies only to samples taken after fasting for at least 8 hours.   BUN 15 6 - 20 mg/dL   Creatinine, Ser 1.18 0.61 - 1.24 mg/dL   Calcium 9.1 8.9 - 10.3 mg/dL   GFR calc non Af Amer >60 >60 mL/min   GFR calc Af Amer >60 >60 mL/min   Anion gap 10 5 - 15    Comment: Performed at [facility] [address] [place], [place] [address]  Cooxemetry Panel (carboxy, met, total hgb, O2 sat)     Status: Abnormal   Collection Time: 07/23/19  2:46 AM  Result Value Ref Range   Total hemoglobin 12.9 12.0 - 16.0 g/dL   O2 Saturation 51.8 %   Carboxyhemoglobin 1.8 (H) 0.5 - 1.5 %   Methemoglobin 1.1 0.0 - 1.5 %    Comment: Performed at [facility] [address] [place], [place] [address]  Glucose, capillary     Status: None   Collection Time: 07/23/19  6:31 AM  Result Value Ref Range   Glucose-Capillary 96 70 - 99 mg/dL    Comment: Glucose reference range applies only to samples taken after fasting for at least 8 hours.   Comment 1 Notify RN    Comment 2 Document in Chart   Glucose, capillary     Status: Abnormal   Collection Time:  07/23/19 11:35 AM  Result Value Ref Range   Glucose-Capillary 117 (H) 70 - 99 mg/dL    Comment: Glucose reference range applies only to samples taken after fasting for at least 8 hours.   Comment 1 Notify RN    Comment 2 Document in Chart   Glucose, capillary     Status: Abnormal   Collection Time: 07/23/19  4:25 PM  Result Value Ref Range   Glucose-Capillary 108 (H) 70 - 99 mg/dL    Comment: Glucose reference range applies only to samples taken after fasting for at least 8 hours.  Glucose, capillary     Status: None   Collection Time: 07/23/19  9:24 PM  Result Value Ref Range   Glucose-Capillary 99 70 - 99 mg/dL    Comment: Glucose reference range applies only to samples taken after fasting for at least 8 hours.   Comment 1 Notify RN   Cooxemetry Panel (carboxy, met, total hgb, O2 sat)     Status: None   Collection Time: 07/24/19  2:55 AM  Result Value Ref Range   Total hemoglobin 12.3 12.0 - 16.0 g/dL   O2 Saturation 49.7 %   Carboxyhemoglobin 1.4 0.5 - 1.5 %   Methemoglobin 0.6 0.0 -  1.5 %    Comment: Performed at [facility], [place] [address], [place], [place] [address]  Lactate dehydrogenase     Status: Abnormal   Collection Time: 07/24/19  3:02 AM  Result Value Ref Range   LDH 194 (H) 98 - 192 U/L    Comment: Performed at [facility], [place] [address] [place], [place] [address]  Protime-INR     Status: Abnormal   Collection Time: 07/24/19  3:02 AM  Result Value Ref Range   Prothrombin Time 31.7 (H) 11.4 - 15.2 seconds   INR 3.2 (H) 0.8 - 1.2    Comment: (NOTE) INR goal varies based on device and disease states. Performed at [facility], [place] [address], [place], [place] [address]   Magnesium     Status: None   Collection Time: 07/24/19  3:02 AM  Result Value Ref Range   Magnesium 2.0 1.7 - 2.4 mg/dL    Comment: Performed at [facility] [address] [place], [place] [address]  Basic metabolic panel     Status: Abnormal   Collection Time:  07/24/19  3:02 AM  Result Value Ref Range   Sodium 132 (L) 135 - 145 mmol/L   Potassium 4.0 3.5 - 5.1 mmol/L   Chloride 98 98 - 111 mmol/L   CO2 27 22 - 32 mmol/L   Glucose, Bld 98 70 - 99 mg/dL    Comment: Glucose reference range applies only to samples taken after fasting for at least 8 hours.   BUN 13 6 - 20 mg/dL   Creatinine, Ser 1.09 0.61 - 1.24 mg/dL   Calcium 8.7 (L) 8.9 - 10.3 mg/dL   GFR calc non Af Amer >60 >60 mL/min   GFR calc Af Amer >60 >60 mL/min   Anion gap 7 5 - 15    Comment: Performed at [facility] [address], [place], [place] [address]  CBC     Status: Abnormal   Collection Time: 07/24/19  3:02 AM  Result Value Ref Range   WBC 8.9 4.0 - 10.5 K/uL   RBC 4.05 (L) 4.22 - 5.81 MIL/uL   Hemoglobin 11.8 (L) 13.0 - 17.0 g/dL   HCT 38.0 (L) 39 - 52 %   MCV 93.8 80.0 - 100.0 fL   MCH 29.1 26.0 - 34.0 pg   MCHC 31.1 30.0 - 36.0 g/dL   RDW 16.4 (H) 11.5 - 15.5 %   Platelets 259 150 - 400 K/uL   nRBC 0.0 0.0 - 0.2 %    Comment: Performed at [facility], [place] [address] [place], [place] [address]  Glucose, capillary     Status: Abnormal   Collection Time: 07/24/19  6:13 AM  Result Value Ref Range   Glucose-Capillary 108 (H) 70 - 99 mg/dL    Comment: Glucose reference range applies only to samples taken after fasting for at least 8 hours.   Comment 1 Notify RN   Glucose, capillary     Status: Abnormal   Collection Time: 07/24/19 11:46 AM  Result Value Ref Range   Glucose-Capillary 109 (H) 70 - 99 mg/dL    Comment: Glucose reference range applies only to samples taken after fasting for at least 8 hours.   No results found.     Medical Problem List and Plan: 1.  Debility secondary to STEMI/LVAD 06/25/2019.  Sternal precautions  -patient may not shower  -ELOS/Goals: 12-16 days/supervision/min a  Admit to  CIR 2.  Antithrombotics: -DVT/anticoagulation: Coumadin  -antiplatelet therapy: Aspirin 81 mg daily 3. Pain Management: Neurontin 300 mg 3  times daily, oxycodone/tramadol as needed  Monitor with increased exertion 4. Mood: Melatonin 5 mg nightly, Klonopin 0.5 mg twice daily, Xanax as needed, trazodone  -antipsychotic agents: N/A 5. Neuropsych: This patient is capable of making decisions on his own behalf. 6. Skin/Wound Care: Routine skin checks 7. Fluids/Electrolytes/Nutrition: Routine in and outs.  CMP ordered. 8.  VF requiring DCCV.  Follow-up cardiology services.  Continue amiodarone 400 mg twice daily as well as Lanoxin 0.125 mg daily, Ranexa 500 mg twice daily 9.  Systolic congestive heart failure.  Monitor for any signs of fluid overload.  Daily weights 10.  Hyperlipidemia.  Continue Lipitor 11.  Tobacco abuse.  Counseling 12.  Renal cell carcinoma.  MRI of abdomen showed a 1.8 cm right renal lesion.  Patient would follow-up outpatient with urology services. 13. Constipation.  MiraLAX daily, Colace 100 mg daily.  Adjust bowel meds as necessary  Cathlyn Parsons, PA-C 07/24/2019  I have personally performed a face to face diagnostic evaluation, including, but not limited to relevant history and physical exam findings, of this patient and developed relevant assessment and plan.  Additionally, I have reviewed and concur with the physician assistant's documentation above.  Delice Lesch, MD, ABPMR  The patient's status has not changed. The original post admission physician evaluation remains appropriate, and any changes from the pre-admission screening or documentation from the acute chart are noted above.   Delice Lesch, MD, ABPMR

## 2019-07-25 ENCOUNTER — Inpatient Hospital Stay (HOSPITAL_COMMUNITY): Payer: BC Managed Care – PPO | Admitting: Occupational Therapy

## 2019-07-25 ENCOUNTER — Inpatient Hospital Stay (HOSPITAL_COMMUNITY): Payer: BC Managed Care – PPO | Admitting: Physical Therapy

## 2019-07-25 DIAGNOSIS — I5023 Acute on chronic systolic (congestive) heart failure: Secondary | ICD-10-CM

## 2019-07-25 DIAGNOSIS — I952 Hypotension due to drugs: Secondary | ICD-10-CM

## 2019-07-25 DIAGNOSIS — K5901 Slow transit constipation: Secondary | ICD-10-CM

## 2019-07-25 DIAGNOSIS — R5381 Other malaise: Principal | ICD-10-CM

## 2019-07-25 DIAGNOSIS — E871 Hypo-osmolality and hyponatremia: Secondary | ICD-10-CM

## 2019-07-25 LAB — PROTIME-INR
INR: 2.5 — ABNORMAL HIGH (ref 0.8–1.2)
Prothrombin Time: 26.1 seconds — ABNORMAL HIGH (ref 11.4–15.2)

## 2019-07-25 LAB — BASIC METABOLIC PANEL
Anion gap: 9 (ref 5–15)
BUN: 16 mg/dL (ref 6–20)
CO2: 25 mmol/L (ref 22–32)
Calcium: 8.6 mg/dL — ABNORMAL LOW (ref 8.9–10.3)
Chloride: 101 mmol/L (ref 98–111)
Creatinine, Ser: 1.05 mg/dL (ref 0.61–1.24)
GFR calc Af Amer: >60 mL/min (ref >60)
GFR calc non Af Amer: >60 mL/min (ref >60)
Glucose, Bld: 93 mg/dL (ref 70–99)
Potassium: 4.4 mmol/L (ref 3.5–5.1)
Sodium: 135 mmol/L (ref 135–145)

## 2019-07-25 LAB — COOXEMETRY PANEL
Carboxyhemoglobin: 1.4 % (ref 0.5–1.5)
Methemoglobin: 0.7 % (ref 0.0–1.5)
O2 Saturation: 47.4 %
Total hemoglobin: 11.4 g/dL — ABNORMAL LOW (ref 12.0–16.0)

## 2019-07-25 LAB — GLUCOSE, CAPILLARY
Glucose-Capillary: 83 mg/dL (ref 70–99)
Glucose-Capillary: 93 mg/dL (ref 70–99)

## 2019-07-25 MED ORDER — WARFARIN SODIUM 1 MG PO TABS
1.0000 mg | ORAL_TABLET | Freq: Once | ORAL | Status: AC
  Administered 2019-07-25: 1 mg via ORAL
  Filled 2019-07-25: qty 1

## 2019-07-25 NOTE — Progress Notes (Signed)
[name] PHYSICAL MEDICINE & REHABILITATION PROGRESS NOTE  Subjective/Complaints: Patient seen sitting up at the edge of his bed this morning.  He states he slept well overnight.  Good sitting balance noted.  He states he gets nauseated with 10 AM meds, but feels better this morning.  ROS: Denies CP, SOB, N/V/D  Objective: Vital Signs: Blood pressure 102/66, pulse 74, temperature (!) 97.5 F (36.4 C), temperature source Oral, resp. rate 20, height 5\' 9"  (1.753 m), weight 84.1 kg, SpO2 96 %. No results found. Recent Labs    07/23/19 0243 07/24/19 0302  WBC 8.9 8.9  HGB 12.2* 11.8*  HCT 39.3 38.0*  PLT 285 259   Recent Labs    07/23/19 0243 07/24/19 0302  NA 134* 132*  K 4.2 4.0  CL 97* 98  CO2 27 27  GLUCOSE 105* 98  BUN 15 13  CREATININE 1.18 1.09  CALCIUM 9.1 8.7*    Physical Exam: BP 102/66   Pulse 74   Temp (!) 97.5 F (36.4 C) (Oral)   Resp 20   Ht 5\' 9"  (1.753 m)   Wt 84.1 kg   SpO2 96%   BMI 27.38 kg/m  Constitutional: No distress . Vital signs reviewed. HENT: Normocephalic.  Atraumatic. Eyes: EOMI. No discharge. Cardiovascular: No JVD.  + LVAD.  + Hum. Respiratory: Normal effort.  No stridor. GI: Non-distended. Skin: Warm and dry.  Intact. Psych: Normal mood.  Normal behavior. Musc: No edema in extremities.  No tenderness in extremities. Neuro: Alert Motor: Grossly 4+/5 throughout  Assessment/Plan: 1. Functional deficits secondary to cardiac debility which require 3+ hours per day of interdisciplinary therapy in a comprehensive inpatient rehab setting.  Physiatrist is providing close team supervision and 24 hour management of active medical problems listed below.  Physiatrist and rehab team continue to assess barriers to discharge/monitor patient progress toward functional and medical goals  Care Tool:  Bathing    Body parts bathed by patient: Right arm, Left arm, Chest, Abdomen, Front perineal area, Buttocks, Right upper leg, Left upper  leg, Right lower leg, Left lower leg, Face         Bathing assist Assist Level: Contact Guard/Touching assist     Upper Body Dressing/Undressing Upper body dressing   What is the patient wearing?: Pull over shirt    Upper body assist Assist Level: Supervision/Verbal cueing    Lower Body Dressing/Undressing Lower body dressing      What is the patient wearing?: Pants     Lower body assist Assist for lower body dressing: Contact Guard/Touching assist     Toileting Toileting Toileting Activity did not occur (Clothing management and hygiene only): N/A (no void or bm)  Toileting assist Assist for toileting: Contact Guard/Touching assist     Transfers Chair/bed transfer  Transfers assist     Chair/bed transfer assist level: Minimal Assistance - Patient > 75%     Locomotion Ambulation   Ambulation assist      Assist level: Minimal Assistance - Patient > 75% Assistive device: Hand held assist Max distance: 100   Walk 10 feet activity   Assist     Assist level: Minimal Assistance - Patient > 75% Assistive device: No Device   Walk 50 feet activity   Assist    Assist level: Minimal Assistance - Patient > 75% Assistive device: No Device    Walk 150 feet activity   Assist Walk 150 feet activity did not occur: Safety/medical concerns  Walk 10 feet on uneven surface  activity   Assist     Assist level: Minimal Assistance - Patient > 75% Assistive device: Hand held assist   Wheelchair     Assist   Type of Wheelchair: Manual    Wheelchair assist level: Supervision/Verbal cueing Max wheelchair distance: 150    Wheelchair 50 feet with 2 turns activity    Assist        Assist Level: Supervision/Verbal cueing   Wheelchair 150 feet activity     Assist     Assist Level: Supervision/Verbal cueing      Medical Problem List and Plan: 1.  Debility secondary to STEMI/LVAD 06/25/2019.  Sternal precautions  Begin  CIR evaluations  2.  Antithrombotics: -DVT/anticoagulation: Coumadin  INR supratherapeutic on 7/24             -antiplatelet therapy: Aspirin 81 mg daily 3. Pain Management: Neurontin 300 mg 3 times daily, oxycodone/tramadol as needed             Monitor with increased exertion 4. Mood: Melatonin 5 mg nightly, Klonopin 0.5 mg twice daily, Xanax as needed, trazodone             -antipsychotic agents: N/A 5. Neuropsych: This patient is capable of making decisions on his own behalf. 6. Skin/Wound Care: Routine skin checks 7. Fluids/Electrolytes/Nutrition: Routine in and outs.   8.  VF requiring DCCV.  Follow-up cardiology services.  Continue amiodarone 400 mg twice daily as well as Lanoxin 0.125 mg daily, Ranexa 500 mg twice daily 9.  Systolic congestive heart failure.  Monitor for any signs of fluid overload. Filed Weights   07/24/19 1614 07/25/19 0000  Weight: 85.2 kg 84.1 kg   10.  Hyperlipidemia.  Continue Lipitor 11.  Tobacco abuse.  Counseling 12.  Renal cell carcinoma.  MRI of abdomen showed a 1.8 cm right renal lesion.  Patient would follow-up outpatient with urology services. 13. Constipation.  MiraLAX daily, Colace 100 mg daily.    Adjust bowel meds as necessary 14.  Hypotension  On digoxin 0.125  On Klonopin, will consider decreasing asymptomatic 15.  Hyponatremia  Sodium 132 on 7/23  Labs ordered for Monday   LOS: 1 days A FACE TO FACE EVALUATION WAS PERFORMED  Aaron [name] Aaron [name] 07/25/2019, 1:05 PM

## 2019-07-25 NOTE — Progress Notes (Signed)
Initial Nutrition Assessment  DOCUMENTATION CODES:   Not applicable  INTERVENTION:   Continue Ensure Enlive po BID, each supplement provides 350 kcal and 20 grams of protein  Magic cup TID with meals, each supplement provides 290 kcal and 9 grams of protein  Double protein portions TID with meals  Continue MVI daily   NUTRITION DIAGNOSIS:   Inadequate oral intake related to poor appetite as evidenced by meal completion < 50%.    GOAL:   Patient will meet greater than or equal to 90% of their needs    MONITOR:   PO intake, Supplement acceptance, Weight trends, Labs, I & O's  REASON FOR ASSESSMENT:   Malnutrition Screening Tool    ASSESSMENT:   Patient with PMH significant for HTN presented this admission with STEMI and acute systolic heart failure.  6/24 - HM3 LVAD placed 7/08 - IABP/[place] placed, intubated 7/13 - IABP discontinued 7/13 - extubated 7/22 - vomiting episode 7/23 - admitted to [facility]  RD unable to reach pt via phone. Per MD, pt dislikes the food and therefore continues to have a poor appetite. Per previous RD documentation, pt denies loss in appetite PTA. Pt stated that he typically consumes three meals per day: B- coffee L-deli sandwich or leftovers D- hamburger helper or air fried chicken with vegetables. Pt consumes 2 mixed drinks each night and more on weekends.  Per previous RD documentation, pt endorsed a UBW of 210 lb and denied wt loss PTA. Records indicate pt weighed 224 lb on 6/16 and 204 lb this admission. Unable to determine dry weight loss vs fluid fluctuation given hx of CHF.    RD will order previously ordered interventions as it is documented that pt enjoys Ensure. Unsure if pt consuming Magic Cup/Double Protein portions at this time.  PO intake: 25% x 1 recorded meal  Labs: Na 132 (L), CBGs 83-129 Medications: Ensure Enlive po BID, Folvite, MVI, Protonix,Thiamine, Warfarin  NUTRITION - FOCUSED PHYSICAL EXAM:  Unable to perform  at this time. RD working remotely.   Diet Order:   Diet Order            Diet Heart Room service appropriate? Yes; Fluid consistency: Thin  Diet effective now                 EDUCATION NEEDS:   No education needs have been identified at this time  Skin:  Skin Assessment: Skin Integrity Issues: Skin Integrity Issues:: Incisions Incisions: chest, abdomen  Last BM:  7/23  Height:   Ht Readings from Last 1 Encounters:  07/24/19 5\' 9"  (1.753 m)    Weight:   Wt Readings from Last 3 Encounters:  07/25/19 84.1 kg  07/24/19 83.2 kg  06/17/19 101.6 kg    BMI:  Body mass index is 27.38 kg/m.  Estimated Nutritional Needs:   Kcal:  2300-2500 kcal  Protein:  115-130 grams  Fluid:  >/=2.3L/d    Larkin Ina, MS, RD, LDN RD pager number and weekend/on-call pager number located in [place].

## 2019-07-25 NOTE — Progress Notes (Signed)
Patient ID: Aaron [name], male   DOB: 08-22-1965, 54 y.o.   MRN: [id number]     Advanced Heart Failure Rounding Note  PCP-Cardiologist: No primary care provider on file.   Subjective:    Feels better today. Walking room with walker. Dizziness improving.   LVAD Interrogation HM III: Speed: 5200 Flow: 4.4  PI: 3.3 Power: 3.7.  VAD interrogated personally. Parameters stable.   Objective:   Weight Range: 84.1 kg Body mass index is 27.38 kg/m.   Vital Signs:   Temp:  [97.5 F (36.4 C)-98.8 F (37.1 C)] 97.5 F (36.4 C) (07/24 0500) Pulse Rate:  [69-99] 74 (07/24 0744) Resp:  [16-20] 20 (07/24 0500) BP: (94-109)/(57-84) 102/66 (07/24 0700) SpO2:  [96 %-99 %] 96 % (07/23 2006) Weight:  [84.1 kg-85.2 kg] 84.1 kg (07/24 0000) Last BM Date: 07/24/19  Weight change: Filed Weights   07/24/19 1614 07/25/19 0000  Weight: 85.2 kg 84.1 kg    Intake/Output:   Intake/Output Summary (Last 24 hours) at 07/25/2019 0832 Last data filed at 07/25/2019 0510 Gross per 24 hour  Intake 480 ml  Output --  Net 480 ml      Physical Exam   General:  Walking room with walker HEENT: normal  Neck: supple. JVP 8.  Carotids 2+ bilat; no bruits. No lymphadenopathy or thryomegaly appreciated. Cor: LVAD hum.  Lungs: Clear. Abdomen: obese soft, nontender, non-distended. No hepatosplenomegaly. No bruits or masses. Good bowel sounds. Driveline site clean. Anchor in place.  Extremities: no cyanosis, clubbing, rash. Warm no edema  Neuro: alert & oriented x 3. No focal deficits. Moves all 4 without problem    Telemetry   Not connected    Labs    CBC Recent Labs    07/23/19 0243 07/24/19 0302  WBC 8.9 8.9  HGB 12.2* 11.8*  HCT 39.3 38.0*  MCV 93.6 93.8  PLT 285 500   Basic Metabolic Panel Recent Labs    07/23/19 0243 07/24/19 0302  NA 134* 132*  K 4.2 4.0  CL 97* 98  CO2 27 27  GLUCOSE 105* 98  BUN 15 13  CREATININE 1.18 1.09  CALCIUM 9.1 8.7*  MG 2.0 2.0   Liver  Function Tests No results for input(s): AST, ALT, ALKPHOS, BILITOT, PROT, ALBUMIN in the last 72 hours. No results for input(s): LIPASE, AMYLASE in the last 72 hours. Cardiac Enzymes No results for input(s): CKTOTAL, CKMB, CKMBINDEX, TROPONINI in the last 72 hours.  BNP: BNP (last 3 results) Recent Labs    07/09/19 0313 07/16/19 0346 07/23/19 0243  BNP 1,815.2* 3,431.7* 1,811.9*    ProBNP (last 3 results) No results for input(s): PROBNP in the last 8760 hours.   D-Dimer No results for input(s): DDIMER in the last 72 hours. Hemoglobin A1C No results for input(s): HGBA1C in the last 72 hours. Fasting Lipid Panel No results for input(s): CHOL, HDL, LDLCALC, TRIG, CHOLHDL, LDLDIRECT in the last 72 hours. Thyroid Function Tests No results for input(s): TSH, T4TOTAL, T3FREE, THYROIDAB in the last 72 hours.  Invalid input(s): FREET3  Other results:   Imaging    No results found.   Medications:     Scheduled Medications: . amiodarone  400 mg Oral BID  . aspirin  81 mg Oral Daily  . atorvastatin  80 mg Oral Daily  . Chlorhexidine Gluconate Cloth  6 each Topical Daily  . clonazePAM  0.5 mg Oral BID  . digoxin  0.125 mg Oral Daily  . feeding supplement (ENSURE ENLIVE)  237 mL Oral BID BM  . folic acid  1 mg Oral Daily  . gabapentin  300 mg Oral TID  . hydrocortisone cream   Topical TID  . melatonin  5 mg Oral QPC supper  . multivitamin with minerals  1 tablet Oral Daily  . pantoprazole  40 mg Oral Daily  . ranolazine  500 mg Oral BID  . sildenafil  20 mg Oral TID  . thiamine  100 mg Oral Daily  . Warfarin - Pharmacist Dosing Inpatient   Does not apply q1600    Infusions:   PRN Medications: acetaminophen, ALPRAZolam, bisacodyl, calcium carbonate, oxyCODONE, polyvinyl alcohol, sodium chloride flush, sorbitol, traMADol, traZODone    Patient Profile   Aaron [name] --post MI cardiogenic shock/ optimization prior to anticipated CABG, at the request of Dr.  Marlou Porch, Cardiology.    Assessment/Plan   1. Acute Anterolateral STEMI/ Multivessel CAD  - S/P HMIII VAD 6/24 - No s/s ischemia currently  2.  Acute Systolic Heart Failure s/p VAD - EF 20%  - HMIII LVAD  Placed 6/24 - Has had RV failure post VAD - Co-ox marginal. Recheck today - Continue sildenafil & digoxin - VAD interrogated personally. Parameters stable. - INR pending  3. Refractory VT/VF  - Quiescent. Continue amio - Keep K > 4.0 Mg > 2.0   4. Renal mass = RCC - MRI abdomen- 1.8 cm right renal lesion, most consistent with renal cell carcinoma. No evidence of renal vein involvement or nodal metastasis. - Discussed w/ Urology and R RCC is small and will just require surveillance.  Ok to proceed with VAD from their standpoint.   5.  Sleep Apnea - frequent apnea at night  - needs formal sleep study    6. Debility - Appreciate CIR  Glori Bickers, MD  07/25/2019 8:32 AM

## 2019-07-25 NOTE — Progress Notes (Signed)
+/-   sleep. Without complaint of SOB, CP or  N & V. Denies pain. Drive train dressing CD&I. Loose BM at HS. Hydrocortisone cream held, no rash observed to abdomen. Patrici Ranks A

## 2019-07-25 NOTE — Plan of Care (Signed)
  Problem: RH Balance Goal: LTG Patient will maintain dynamic standing with ADLs (OT) Description: LTG:  Patient will maintain dynamic standing balance with assist during activities of daily living (OT)  Flowsheets (Taken 07/25/2019 1641) LTG: Pt will maintain dynamic standing balance during ADLs with: Independent with assistive device   Problem: Sit to Stand Goal: LTG:  Patient will perform sit to stand in prep for activites of daily living with assistance level (OT) Description: LTG:  Patient will perform sit to stand in prep for activites of daily living with assistance level (OT) Flowsheets (Taken 07/25/2019 1641) LTG: PT will perform sit to stand in prep for activites of daily living with assistance level: Independent with assistive device   Problem: RH Grooming Goal: LTG Patient will perform grooming w/assist,cues/equip (OT) Description: LTG: Patient will perform grooming with assist, with/without cues using equipment (OT) Flowsheets (Taken 07/25/2019 1641) LTG: Pt will perform grooming with assistance level of: Independent with assistive device    Problem: RH Bathing Goal: LTG Patient will bathe all body parts with assist levels (OT) Description: LTG: Patient will bathe all body parts with assist levels (OT) Flowsheets (Taken 07/25/2019 1641) LTG: Pt will perform bathing with assistance level/cueing: Set up assist    Problem: RH Dressing Goal: LTG Patient will perform upper body dressing (OT) Description: LTG Patient will perform upper body dressing with assist, with/without cues (OT). Flowsheets (Taken 07/25/2019 1641) LTG: Pt will perform upper body dressing with assistance level of: Independent with assistive device Goal: LTG Patient will perform lower body dressing w/assist (OT) Description: LTG: Patient will perform lower body dressing with assist, with/without cues in positioning using equipment (OT) Flowsheets (Taken 07/25/2019 1641) LTG: Pt will perform lower body dressing  with assistance level of: (excluding Teds)  Independent with assistive device  Other (Comment)   Problem: RH Toileting Goal: LTG Patient will perform toileting task (3/3 steps) with assistance level (OT) Description: LTG: Patient will perform toileting task (3/3 steps) with assistance level (OT)  Flowsheets (Taken 07/25/2019 1641) LTG: Pt will perform toileting task (3/3 steps) with assistance level: Independent with assistive device   Problem: RH Toilet Transfers Goal: LTG Patient will perform toilet transfers w/assist (OT) Description: LTG: Patient will perform toilet transfers with assist, with/without cues using equipment (OT) Flowsheets (Taken 07/25/2019 1641) LTG: Pt will perform toilet transfers with assistance level of: Independent with assistive device

## 2019-07-25 NOTE — Progress Notes (Signed)
Dressing change will be arranged by VAD RN for 7/25. Contacted for additional orders related to VAD. She will contact provider.

## 2019-07-25 NOTE — Evaluation (Signed)
Occupational Therapy Assessment and Plan  Patient Details  Name: Aaron [name] MRN: [id number] Date of Birth: January 10, 1965  OT Diagnosis: acute pain and muscle weakness (generalized) Rehab Potential: Rehab Potential (ACUTE ONLY): Good ELOS: 7-10 days   Today's Date: 07/25/2019 OT Individual Time: 4818-5631 and 1300-1343 OT Individual Time Calculation (min): 60 min  And 43 min  Hospital Problem: Principal Problem:   Debility   Past Medical History:  Past Medical History:  Diagnosis Date  . Acute anterolateral wall MI ([place])   . CAD, multiple vessel   . Hypertension   . Ischemic cardiomyopathy   . Systolic heart failure ([place])   . Tobacco abuse    Past Surgical History:  Past Surgical History:  Procedure Laterality Date  . CARDIOVERSION N/A 07/02/2019   Procedure: CARDIOVERSION;  Surgeon: Jolaine Artist, MD;  Location: [facility] OR;  Service: Cardiovascular;  Laterality: N/A;  . CORONARY/GRAFT ACUTE MI REVASCULARIZATION N/A 06/17/2019   Procedure: Coronary/Graft Acute MI Revascularization;  Surgeon: Yolonda Kida, MD;  Location: [place] [facility] LAB;  Service: Cardiovascular;  Laterality: N/A;  . IABP INSERTION N/A 06/17/2019   Procedure: IABP Insertion;  Surgeon: Yolonda Kida, MD;  Location: [place] [facility];  Service: Cardiovascular;  Laterality: N/A;  . IABP INSERTION N/A 06/24/2019   Procedure: IABP INSERTION;  Surgeon: Jolaine Artist, MD;  Location: [place] [facility];  Service: Cardiovascular;  Laterality: N/A;  . IABP INSERTION N/A 07/09/2019   Procedure: IABP Insertion;  Surgeon: Jolaine Artist, MD;  Location: [place] [facility];  Service: Cardiovascular;  Laterality: N/A;  . INSERTION OF IMPLANTABLE LEFT VENTRICULAR ASSIST DEVICE N/A 06/25/2019   Procedure: INSERTION OF IMPLANTABLE LEFT VENTRICULAR ASSIST DEVICE USING THORATEC HEARTMATE 3;  Surgeon: Wonda Olds, MD;  Location: [place];  Service: Open Heart Surgery;  Laterality: N/A;  . LEFT HEART CATH  AND CORONARY ANGIOGRAPHY N/A 06/17/2019   Procedure: LEFT HEART CATH AND CORONARY ANGIOGRAPHY;  Surgeon: Yolonda Kida, MD;  Location: [place] [facility];  Service: Cardiovascular;  Laterality: N/A;  . RIGHT HEART CATH N/A 06/17/2019   Procedure: RIGHT HEART CATH;  Surgeon: Yolonda Kida, MD;  Location: [place] [facility];  Service: Cardiovascular;  Laterality: N/A;  . RIGHT HEART CATH N/A 06/24/2019   Procedure: RIGHT HEART CATH;  Surgeon: Jolaine Artist, MD;  Location: [place] [facility];  Service: Cardiovascular;  Laterality: N/A;  . RIGHT HEART CATH N/A 07/09/2019   Procedure: RIGHT HEART CATH;  Surgeon: Jolaine Artist, MD;  Location: [place] [facility];  Service: Cardiovascular;  Laterality: N/A;  . TEE WITHOUT CARDIOVERSION N/A 06/25/2019   Procedure: TRANSESOPHAGEAL ECHOCARDIOGRAM (TEE);  Surgeon: Wonda Olds, MD;  Location: [place];  Service: Open Heart Surgery;  Laterality: N/A;    Assessment & Plan Clinical Impression: Aaron [name] is a 54 year old right-handed male with history of hypertension and tobacco abuse systolic congestive heart failure who is on no prescription medications.  History taken from chart review and patient.  Per chart review patient lives alone independent prior to admission 1 level home 3 steps to entry.  He does have family in the area.  He presented on 06/17/2019 to OSH, [facility] with acute onset of chest pain.  EKG in the ED showed normal sinus rhythm with a rate of 96 bpm ST elevation in V1-V3, aVR.  ST depressions in inferior lateral leads and Q waves in V1-V3 for diagnosis of STEMI and was taken to  the cardiac Cath Lab found to have multilevel CAD.  Attempts were made to open the LAD but the wire would not cross.  Echocardiogram with ejection fraction of 25%.  Patient was transferred to [facility].  Patient underwent LVAD implantation 06/25/2019 per Dr. Orvan Seen.  Hospital course complicated by 2 episodes of ventricular  fibrillation requiring emergent DC-CV on 07/02/2019 with recurrent VF on 07/03/2019 and broke spontaneously.  Hospital course further complicated by sepsis on 07/09/2019 due to pneumonia requiring intubation for a short time and extubated 07/14/2019.  Follow-up cardiology services for systolic congestive heart failure requiring midodrine.  Chronic Coumadin has been initiated.  During hospital course findings incidental of renal mass on MRI of abdomen 1.8 cm right renal lesion consistent with renal cell carcinoma.  No evidence of renal vein involvement or nodal metastasis.  Discussed with urology plan at this time requiring surveillance and would follow-up as outpatient.  Palliative care has been consulted to establish goals of care.  Patient did have an episode of vomiting on 07/23/2019 improved with the use of Zofran.  He is tolerating a regular consistency diet.  Therapy evaluations completed and patient was admitted for a comprehensive rehab program.  Please see preadmission assessment from earlier today as well.  Patient currently requires min with basic self-care skills secondary to muscle weakness, decreased cardiorespiratoy endurance and decreased standing balance.  Prior to hospitalization, patient could complete BADLs with independent .  Patient will benefit from skilled intervention to increase independence with basic self-care skills prior to discharge home with sister Di Kindle.  Anticipate patient will require intermittent supervision and follow up home health.  OT - End of Session Endurance Deficit: Yes Endurance Deficit Description: Pt needing seated rest after a few minutes of standing activity OT Assessment Rehab Potential (ACUTE ONLY): Good OT Barriers to Discharge: Wound Care;Other (comments) OT Barriers to Discharge Comments: LVAD care OT Patient demonstrates impairments in the following area(s): Balance;Behavior;Safety;Skin Integrity;Endurance;Motor;Pain OT Basic ADL's Functional Problem(s):  Grooming;Bathing;Dressing;Toileting OT Advanced ADL's Functional Problem(s): Simple Meal Preparation;Laundry OT Transfers Functional Problem(s): Toilet OT Additional Impairment(s): None OT Plan OT Intensity: Minimum of 1-2 x/day, 45 to 90 minutes OT Frequency: 5 out of 7 days OT Duration/Estimated Length of Stay: 7-10 days OT Treatment/Interventions: Balance/vestibular training;Discharge planning;Pain management;Self Care/advanced ADL retraining;Therapeutic Activities;UE/LE Coordination activities;Therapeutic Exercise;Skin care/wound managment;Patient/family education;Disease mangement/prevention;Functional mobility training;Community reintegration;DME/adaptive equipment instruction;Psychosocial support;UE/LE Strength taining/ROM OT Self Feeding Anticipated Outcome(s): No goal OT Basic Self-Care Anticipated Outcome(s): Supervision/setup-Mod I OT Toileting Anticipated Outcome(s): Mod I OT Bathroom Transfers Anticipated Outcome(s): Mod I OT Recommendation Recommendations for Other Services: Neuropsych consult Patient destination: Home Follow Up Recommendations: Home health OT Equipment Recommended: To be determined OT Evaluation Home Living/Prior [place] expects to be discharged to:: Private residence Living Arrangements: Alone Available Help at Discharge: Family, Available 24 hours/day (pts sister can provide 24/7 supervision at time of d/c for a few weeks) Type of Home: House Home Access: Stairs to enter [employer] of Steps: 2-3 Entrance Stairs-Rails: Right, Left, Can reach both Home Layout: One level Bathroom Shower/Tub: Tub/shower unit, Industrial/product designer: Yes  Lives With: Alone IADL History Homemaking Responsibilities: Yes (pt reports independently fulfilling all IADL roles PTA) Occupation: Full time employment 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, museum, mostly pertaining to  Clinical cytogeneticist Leisure and Hobbies: Proofreader, taking care of his Scientist, forensic Prior Function Level of Independence: Independent with basic ADLs, Independent with homemaking with ambulation, Independent with gait  Able to Take Stairs?: Yes  Driving: Yes Vision Baseline Vision/History: Wears glasses Wears Glasses: Reading only Patient Visual Report: No change from baseline Vision Assessment?: No apparent visual deficits Perception  Perception: Within Functional Limits Praxis Praxis: Intact Cognition Overall Cognitive Status: Impaired/Different from baseline Arousal/Alertness: Awake/alert Orientation Level: Person;Place;Situation Person: Oriented Place: Oriented Situation: Oriented Year: 2021 Month: July Day of Week: Correct Memory: Appears intact Immediate Memory Recall: Sock;Blue;Bed Memory Recall Sock: Without Cue Memory Recall Blue: Without Cue Memory Recall Bed: Without Cue Awareness: Appears intact Problem Solving: Impaired Problem Solving Impairment: Functional complex Behaviors: Impulsive Sensation Sensation Light Touch: Appears Intact Proprioception: Appears Intact Coordination Gross Motor Movements are Fluid and Coordinated: No Fine Motor Movements are Fluid and Coordinated: Yes Coordination and Movement Description: Mild balance impairments during functional ambulation without AD Finger Nose Finger Test: Mild dysmetria on the Lt Heel Shin Test: decreased speed  R and L Motor  Motor Motor: Other (comment) Motor - Skilled Clinical Observations: generalized weakness  Trunk/Postural Assessment  Cervical Assessment Cervical Assessment: Within Functional Limits Thoracic Assessment Thoracic Assessment: Within Functional Limits Lumbar Assessment Lumbar Assessment: Within Functional Limits Postural Control Postural Control: Within Functional Limits  Balance Balance Balance Assessed: Yes Dynamic Sitting Balance Dynamic Sitting - Balance Support: No upper  extremity supported Dynamic Sitting - Level of Assistance: 5: Stand by assistance (donning gripper socks) Dynamic Standing Balance Dynamic Standing - Balance Support: No upper extremity supported;During functional activity Dynamic Standing - Level of Assistance: 4: Min assist (Ambulating to the toilet without AD) Extremity/Trunk Assessment RUE Assessment RUE Assessment: Within Functional Limits General Strength Comments: MMT contraindicated due to sternal precautions LUE Assessment LUE Assessment: Within Functional Limits General Strength Comments: MMT contraindicated due to sternal precautions  Care Tool Care Tool Self Care Eating    Independent     Oral Care    Oral Care Assist Level: Set up assist    Bathing   Body parts bathed by patient: Right arm;Left arm;Chest;Abdomen;Front perineal area;Buttocks;Right upper leg;Left upper leg;Right lower leg;Left lower leg;Face     Assist Level: Contact Guard/Touching assist    Upper Body Dressing(including orthotics)   What is the patient wearing?: Pull over shirt   Assist Level: Supervision assistance    Lower Body Dressing (excluding footwear)   What is the patient wearing?: Pants Assist for lower body dressing: Contact Guard/Touching assist    Putting on/Taking off footwear   What is the patient wearing?: Ted hose;Non-skid slipper socks Assist for footwear: Moderate Assistance - Patient 50 - 74%       Care Tool Toileting Toileting activity Toileting Activity did not occur (Clothing management and hygiene only): N/A (no void or bm)       Care Tool Bed Mobility Roll left and right activity   Roll left and right assist level: Supervision/Verbal cueing    Sit to lying activity   Sit to lying assist level: Supervision/Verbal cueing    Lying to sitting edge of bed activity   Lying to sitting edge of bed assist level: Supervision/Verbal cueing     Care Tool Transfers Sit to stand transfer   Sit to stand assist level:  Minimal Assistance - Patient > 75%    Chair/bed transfer   Chair/bed transfer assist level: Minimal Assistance - Patient > 75%     Toilet transfer   Assist Level: Contact Guard/Touching assist     Care Tool Cognition Expression of Ideas and Wants Expression of Ideas and Wants: Without difficulty (complex and basic) - expresses complex messages without difficulty and with  speech that is clear and easy to understand   Understanding Verbal and Non-Verbal Content Understanding Verbal and Non-Verbal Content: Understands (complex and basic) - clear comprehension without cues or repetitions   Memory/Recall Ability *first 3 days only Memory/Recall Ability *first 3 days only: Current season;Location of own room;Staff names and faces;That he or she is in a hospital/hospital unit    Refer to Care Plan for [place] 1 OT Short Term Goal 1 (Week 1): STGs=LTGs due to ELOS  Recommendations for other services: Neuropsych   Skilled Therapeutic Intervention Skilled OT session completed with focus on initial evaluation, education on OT role/POC, and establishment of patient-centered goals.   Pt greeted EOB, finishing up breakfast. He was able to verbalize his sternal precautions and exhibited carryover during session with min cuing. Pt also stated he had some sternal pain but that it was manageable for tx without interventions to address. When he completed his meal, pt ambulated with CGA and no AD to the sink to engage in UB bathing/shaving tasks. He stood for 3 minutes before needing to sit down. Pt then resumed with bathing/dressing and oral care tasks at sit<stand level at the sink, CGA for dynamic standing balance. Pt able to utilize seated figure 4 to meet demands of LB self care. Ambulatory transfer to the toilet completed without AD and CGA. He then returned to the bed and was able to connect himself to battery power using harness and then reconnect back to wall power at  independent level. Pt returned to bed at end of session and remained in bed with all needs within reach and bed alarm set.   2nd Session 1:1 tx (43 min) Pt greeted in bed after finishing his lunch. Supine<sit from flat bed without bedrails, per home setup, completed with min cuing. Pt then switched over from wall<battery power with min cuing. CGA for ambulatory transfer to the w/c without AD. To work on higher level balance, escorted pt to the outdoor patio. He ambulated over uneven surfaces using the RW for support, CGA with vcs for decreasing UE reliance on device. He transferred to 2 surfaces with cuing for device mgt. Pt was then escorted back to room via w/c. Ambulatory transfer to toilet completed using RW with CGA-close supervision assistance. Pt with continent bladder void, CGA for clothing mgt. At end of session pt returned to the bed, changed over from battery<wall power with setup, and then was left with all needs within reach and bed alarm set.   ADL ADL Eating: Independent Where Assessed-Eating: Edge of bed Grooming: Setup Where Assessed-Grooming: Edge of bed;Sitting at sink Upper Body Bathing: Supervision/safety Where Assessed-Upper Body Bathing: Standing at sink Lower Body Bathing: Contact guard Where Assessed-Lower Body Bathing: Standing at sink;Sitting at sink Upper Body Dressing: Supervision/safety Where Assessed-Upper Body Dressing: Sitting at sink Lower Body Dressing: Minimal assistance Where Assessed-Lower Body Dressing: Sitting at sink Toileting: Not assessed Toilet Transfer: Contact guard Toilet Transfer Method: Ambulating (without AD) Toilet Transfer Equipment: Grab bars Tub/Shower Transfer: Not assessed Mobility  CGA ambulatory toilet transfer without AD  Discharge Criteria: Patient will be discharged from OT if patient refuses treatment 3 consecutive times without medical reason, if treatment goals not met, if there is a change in medical status, if patient makes  no progress towards goals or if patient is discharged from hospital.  The above assessment, treatment plan, treatment alternatives and goals were discussed and mutually agreed upon: by patient  Skeet Simmer 07/25/2019,  12:41 PM

## 2019-07-25 NOTE — Progress Notes (Signed)
Physical Therapy Session Note  Patient Details  Name: Aaron [name] MRN: [id number] Date of Birth: [birth year]  Today's Date: 07/25/2019 PT Individual Time: 5625-6389 PT Individual Time Calculation (min): 30 min   Short Term Goals: Week 1:  PT Short Term Goal 1 (Week 1): STG=LTG due to ELOS  Skilled Therapeutic Interventions/Progress Updates:  Pt received supine in bed and agreeable to PT. Supine>sit transfer with supervision assist for safety. Pt able to connect LVAD to batteries with set up assist. Pt performed gait training with RW x 178f, 1221f and [id number] assist throughout with cues for AD management in turns and to improve step width to prevent lateral LOB. Pt performed reciprocal foot taps on 4 inch step with BUE support and then lateral foot taps on 4 inch step with UE support on RW; completed x10 BLE with cues for posture and decreased UE support as tolerated. Pt returned to room and performed stand pivot transfer to bed with RW and supervision assist. Pt disconnected LVAD from batteries with supervision assist. Pt left sitting EOB wth call bell in reach and all needs met. .      Therapy Documentation Precautions:  Restrictions Weight Bearing Restrictions: No Vital Signs: Therapy Vitals Pulse Rate: 73 Resp: 18 BP: (!) 82/64 Patient Position (if appropriate): Lying Oxygen Therapy SpO2: 94 % O2 Device: Room Air Pain: Pain Assessment Pain Scale: 0-10 Pain Score: 5  Pain Type: Acute pain Pain Location: Abdomen Pain Descriptors / Indicators: Aching Pain Frequency: Intermittent Pain Onset: Gradual Pain Intervention(s): Medication (See eMAR)    Therapy/Group: Individual Therapy  AusLorie Phenix24/2021, 5:57 PM

## 2019-07-25 NOTE — Evaluation (Signed)
Physical Therapy Assessment and Plan  Patient Details  Name: Aaron [name] MRN: [id number] Date of Birth: 1965-12-05  PT Diagnosis: Coordination disorder, Difficulty walking and Muscle weakness Rehab Potential: Good ELOS: 7-10 days   Today's Date: 07/25/2019 PT Individual Time: 1105-1205 PT Individual Time Calculation (min): 60 min    Hospital Problem: Principal Problem:   Debility   Past Medical History:  Past Medical History:  Diagnosis Date  . Acute anterolateral wall MI ([place])   . CAD, multiple vessel   . Hypertension   . Ischemic cardiomyopathy   . Systolic heart failure ([place])   . Tobacco abuse    Past Surgical History:  Past Surgical History:  Procedure Laterality Date  . CARDIOVERSION N/A 07/02/2019   Procedure: CARDIOVERSION;  Surgeon: Jolaine Artist, MD;  Location: [facility];  Service: Cardiovascular;  Laterality: N/A;  . CORONARY/GRAFT ACUTE MI REVASCULARIZATION N/A 06/17/2019   Procedure: Coronary/Graft Acute MI Revascularization;  Surgeon: Yolonda Kida, MD;  Location: [place] [facility];  Service: Cardiovascular;  Laterality: N/A;  . IABP INSERTION N/A 06/17/2019   Procedure: IABP Insertion;  Surgeon: Yolonda Kida, MD;  Location: [place] [facility];  Service: Cardiovascular;  Laterality: N/A;  . IABP INSERTION N/A 06/24/2019   Procedure: IABP INSERTION;  Surgeon: Jolaine Artist, MD;  Location: [place] [facility];  Service: Cardiovascular;  Laterality: N/A;  . IABP INSERTION N/A 07/09/2019   Procedure: IABP Insertion;  Surgeon: Jolaine Artist, MD;  Location: [place] [facility];  Service: Cardiovascular;  Laterality: N/A;  . INSERTION OF IMPLANTABLE LEFT VENTRICULAR ASSIST DEVICE N/A 06/25/2019   Procedure: INSERTION OF IMPLANTABLE LEFT VENTRICULAR ASSIST DEVICE USING THORATEC HEARTMATE 3;  Surgeon: Wonda Olds, MD;  Location: [place];  Service: Open Heart Surgery;  Laterality: N/A;  . LEFT HEART CATH AND CORONARY ANGIOGRAPHY N/A 06/17/2019    Procedure: LEFT HEART CATH AND CORONARY ANGIOGRAPHY;  Surgeon: Yolonda Kida, MD;  Location: [place] [facility];  Service: Cardiovascular;  Laterality: N/A;  . RIGHT HEART CATH N/A 06/17/2019   Procedure: RIGHT HEART CATH;  Surgeon: Yolonda Kida, MD;  Location: [place] [facility];  Service: Cardiovascular;  Laterality: N/A;  . RIGHT HEART CATH N/A 06/24/2019   Procedure: RIGHT HEART CATH;  Surgeon: Jolaine Artist, MD;  Location: [place] [facility];  Service: Cardiovascular;  Laterality: N/A;  . RIGHT HEART CATH N/A 07/09/2019   Procedure: RIGHT HEART CATH;  Surgeon: Jolaine Artist, MD;  Location: [place] [facility];  Service: Cardiovascular;  Laterality: N/A;  . TEE WITHOUT CARDIOVERSION N/A 06/25/2019   Procedure: TRANSESOPHAGEAL ECHOCARDIOGRAM (TEE);  Surgeon: Wonda Olds, MD;  Location: [place];  Service: Open Heart Surgery;  Laterality: N/A;    Assessment & Plan Clinical Impression: Patient is a 54 year old right-handed male with history of hypertension and tobacco abuse systolic congestive heart failure who is on no prescription medications.  History taken from chart review and patient.  Per chart review patient lives alone independent prior to admission 1 level home 3 steps to entry.  He does have family in the area.  He presented on 06/17/2019 to OSH, [facility] with acute onset of chest pain.  EKG in the ED showed normal sinus rhythm with a rate of 96 bpm ST elevation in V1-V3, aVR.  ST depressions in inferior lateral leads and Q waves in V1-V3 for diagnosis of STEMI and was taken to the cardiac Cath Lab found to have multilevel CAD.  Attempts were made to open the LAD but the wire would not cross.  Echocardiogram with ejection fraction of 25%.  Patient was transferred to [facility].  Patient underwent LVAD implantation 06/25/2019 per Dr. Orvan Seen.  Hospital course complicated by 2 episodes of ventricular fibrillation requiring emergent DC-CV on 07/02/2019 with  recurrent VF on 07/03/2019 and broke spontaneously.  Hospital course further complicated by sepsis on 07/09/2019 due to pneumonia requiring intubation for a short time and extubated 07/14/2019.  Follow-up cardiology services for systolic congestive heart failure requiring midodrine.  Chronic Coumadin has been initiated.  During hospital course findings incidental of renal mass on MRI of abdomen 1.8 cm right renal lesion consistent with renal cell carcinoma.  No evidence of renal vein involvement or nodal metastasis.  Discussed with urology plan at this time requiring surveillance and would follow-up as outpatient.  Palliative care has been consulted to establish goals of care.    Patient transferred to CIR on 07/24/2019 .   Patient currently requires min with mobility secondary to muscle weakness and muscle joint tightness, decreased cardiorespiratoy endurance, decreased problem solving and delayed processing and decreased standing balance, decreased balance strategies and difficulty maintaining precautions.  Prior to hospitalization, patient was independent  with mobility and lived with Alone in a House home.  Home access is 2-3Stairs to enter.  Patient will benefit from skilled PT intervention to maximize safe functional mobility, minimize fall risk and decrease caregiver burden for planned discharge home with intermittent assist.  Anticipate patient will benefit from follow up [facility] at discharge.  PT - End of Session Activity Tolerance: Tolerates 10 - 20 min activity with multiple rests Endurance Deficit: Yes PT Assessment Rehab Potential (ACUTE/IP ONLY): Good PT Barriers to Discharge: [place] home environment;Decreased caregiver support;Home environment access/layout;Insurance for SNF coverage PT Patient demonstrates impairments in the following area(s): Balance;Endurance;Pain;Safety PT Transfers Functional Problem(s): Bed Mobility;Bed to Chair;Car;Furniture;Floor PT Locomotion Functional Problem(s):  Ambulation;Wheelchair Mobility;Stairs PT Plan PT Intensity: Minimum of 1-2 x/day ,45 to 90 minutes PT Frequency: 5 out of 7 days PT Duration Estimated Length of Stay: 7-10 days PT Treatment/Interventions: Ambulation/gait training;Community reintegration;Neuromuscular re-education;DME/adaptive equipment instruction;Psychosocial support;Stair training;Wheelchair propulsion/positioning;UE/LE Strength taining/ROM;Balance/vestibular training;Discharge planning;Functional electrical stimulation;Pain management;Therapeutic Activities;Skin care/wound management;UE/LE Coordination activities;Disease management/prevention;Cognitive remediation/compensation;Functional mobility training;Patient/family education;Splinting/orthotics;Visual/perceptual remediation/compensation;Therapeutic Exercise PT Transfers Anticipated Outcome(s): Mod I with LRAD PT Locomotion Anticipated Outcome(s): Mod I with LRAD PT Recommendation Recommendations for Other Services: Therapeutic Recreation consult Therapeutic Recreation Interventions: Stress management;Outing/community reintergration Follow Up Recommendations: Home health PT Patient destination: Home Equipment Recommended: Rolling walker with 5" wheels;To be determined   PT Evaluation Precautions/Restrictions   Chest: LVAD Pain   deins Home Living/Prior Functioning Home Living Available Help at Discharge: Family;Available 24 hours/day Type of Home: House Home Access: Stairs to enter [employer] of Steps: 2-3 Entrance Stairs-Rails: Right;Left;Can reach both Home Layout: One level Bathroom Shower/Tub: Product/process development scientist: Standard Bathroom Accessibility: Yes  Lives With: Alone Prior Function Level of Independence: Independent with basic ADLs;Independent with homemaking with ambulation;Independent with gait  Able to Take Stairs?: Yes Driving: Yes Vocation: Full time employment Comments: works in Education officer, museum;  occasionally wears glasses for reading; has a dog named Public relations account executive; enjoys "tinkering". plans for have sister stay with him for couple week following d/c Vision/Perception  Perception Perception: Within Functional Limits Praxis Praxis: Intact  Cognition Overall Cognitive Status: Impaired/Different from baseline Orientation Level: Oriented X4 Memory: Appears intact Awareness: Appears intact Problem Solving: Impaired Problem Solving Impairment: Functional complex Safety/Judgment: Impaired Comments: at times, pt demonstrated  decreased processing speed . Sensation Sensation Light Touch: Appears Intact Proprioception: Appears Intact Coordination Gross Motor Movements are Fluid and Coordinated: No Fine Motor Movements are Fluid and Coordinated: Yes Coordination and Movement Description: mild dysmetria with functional movement in BLE Finger Nose Finger Test: decreased speed with mild tremor at end range Heel Shin Test: decreased speed  R and L Motor  Motor Motor: Other (comment) Motor - Skilled Clinical Observations: generalized weakness   Trunk/Postural Assessment  Cervical Assessment Cervical Assessment: Within Functional Limits Thoracic Assessment Thoracic Assessment: Within Functional Limits Lumbar Assessment Lumbar Assessment: Within Functional Limits Postural Control Postural Control: Within Functional Limits  Balance Balance Balance Assessed: Yes Standardized Balance Assessment Standardized Balance Assessment: Berg Balance Test Berg Balance Test Sit to Stand: Able to stand  independently using hands Standing Unsupported: Able to stand 2 minutes with supervision Sitting with Back Unsupported but Feet Supported on Floor or Stool: Able to sit safely and securely 2 minutes Stand to Sit: Controls descent by using hands Transfers: Able to transfer safely, definite need of hands Standing Unsupported with Eyes Closed: Able to stand 10 seconds with supervision Standing  Ubsupported with Feet Together: Able to place feet together independently and stand for 1 minute with supervision From Standing, Reach Forward with Outstretched Arm: Can reach forward >12 cm safely (5") From Standing Position, Pick up Object from Floor: Unable to pick up and needs supervision From Standing Position, Turn to Look Behind Over each Shoulder: Looks behind one side only/other side shows less weight shift Turn 360 Degrees: Needs close supervision or verbal cueing Standing Unsupported, Alternately Place Feet on Step/Stool: Able to complete >2 steps/needs minimal assist Standing Unsupported, One Foot in Front: Able to take small step independently and hold 30 seconds Standing on One Leg: Tries to lift leg/unable to hold 3 seconds but remains standing independently Total Score: 34 Static Sitting Balance Static Sitting - Balance Support: No upper extremity supported Static Sitting - Level of Assistance: 7: Independent Dynamic Sitting Balance Dynamic Sitting - Balance Support: No upper extremity supported Dynamic Sitting - Level of Assistance: 5: Stand by assistance Static Standing Balance Static Standing - Balance Support: No upper extremity supported Static Standing - Level of Assistance: 5: Stand by assistance Dynamic Standing Balance Dynamic Standing - Balance Support: No upper extremity supported;During functional activity Dynamic Standing - Level of Assistance: 4: Min assist;3: Mod assist Extremity Assessment      RLE Assessment RLE Assessment: Within Functional Limits General Strength Comments: grossly 4+/5 proximal to distal LLE Assessment LLE Assessment: Within Functional Limits General Strength Comments: grossly 4+/5 proximal to distal  Care Tool Care Tool Bed Mobility Roll left and right activity   Roll left and right assist level: Supervision/Verbal cueing    Sit to lying activity   Sit to lying assist level: Supervision/Verbal cueing    Lying to sitting edge  of bed activity   Lying to sitting edge of bed assist level: Supervision/Verbal cueing     Care Tool Transfers Sit to stand transfer   Sit to stand assist level: Minimal Assistance - Patient > 75%    Chair/bed transfer   Chair/bed transfer assist level: Minimal Assistance - Patient > 75%     Toilet transfer   Assist Level: Contact Guard/Touching assist    Car transfer   Car transfer assist level: Minimal Assistance - Patient > 75%      Care Tool Locomotion Ambulation   Assist level: Minimal Assistance - Patient > 75% Assistive device: Hand held  assist Max distance: 100  Walk 10 feet activity   Assist level: Minimal Assistance - Patient > 75% Assistive device: No Device   Walk 50 feet with 2 turns activity   Assist level: Minimal Assistance - Patient > 75% Assistive device: No Device  Walk 150 feet activity Walk 150 feet activity did not occur: Safety/medical concerns      Walk 10 feet on uneven surfaces activity   Assist level: Minimal Assistance - Patient > 75% Assistive device: Hand held assist  Stairs   Assist level: Moderate Assistance - Patient - 50 - 74% Stairs assistive device: No device Max number of stairs: 2  Walk up/down 1 step activity   Walk up/down 1 step (curb) assist level: Moderate Assistance - Patient - 50 - 74% Walk up/down 1 step or curb assistive device: No device Walk up/down 4 steps activity did not occuR: Safety/medical concerns  Walk up/down 4 steps activity      Walk up/down 12 steps activity Walk up/down 12 steps activity did not occur: Safety/medical concerns      Pick up small objects from floor Pick up small object from the floor (from standing position) activity did not occur: Safety/medical concerns      Wheelchair   Type of Wheelchair: Manual   Wheelchair assist level: Supervision/Verbal cueing Max wheelchair distance: 150  Wheel 50 feet with 2 turns activity   Assist Level: Supervision/Verbal cueing  Wheel 150 feet activity    Assist Level: Supervision/Verbal cueing    Refer to Care Plan for Long Term Goals  SHORT TERM GOAL WEEK 1 PT Short Term Goal 1 (Week 1): STG=LTG due to ELOS  Recommendations for other services: Therapeutic Recreation  Stress management and Outing/community reintegration  Skilled Therapeutic Intervention Mobility Bed Mobility Bed Mobility: Rolling Right;Rolling Left;Sit to Supine;Supine to Sit Rolling Right: Supervision/verbal cueing Rolling Left: Supervision/Verbal cueing Supine to Sit: Supervision/Verbal cueing Sit to Supine: Supervision/Verbal cueing Transfers Transfers: Sit to Stand;Stand Pivot Transfers Sit to Stand: Minimal Assistance - Patient > 75% Stand Pivot Transfers: Minimal Assistance - Patient > 75% Stand Pivot Transfer Details (indicate cue type and reason): cues for LVAD management and use of UE to prevent prevent lateral LOB to the L Transfer (Assistive device): None Locomotion  Gait Ambulation: Yes Gait Assistance: Contact Guard/Touching assist Gait Distance (Feet): 100 Feet Assistive device: Rolling walker Gait Gait: Yes Gait Pattern: Impaired Gait Pattern: Right flexed knee in stance;Left flexed knee in stance;Narrow base of support Stairs / Additional Locomotion Stairs: Yes Stairs Assistance: Minimal Assistance - Patient > 75% Stair Management Technique: Two rails Number of Stairs: 12 Height of Stairs: 6 Wheelchair Mobility Wheelchair Mobility: Yes Wheelchair Assistance: Chartered loss adjuster: Both upper extremities Wheelchair Parts Management: Needs assistance Distance: 150    Pt returned to room and performed stand pivot transfer to bed with RW and CGA. Pt able to disconnect LVAD from batteries and attach to wall unit with increased time and supervision assist. Sit>supine completed with supervision assist  and left supine in bed with call bell in reach and all needs met.    Discharge Criteria: Patient will be  discharged from PT if patient refuses treatment 3 consecutive times without medical reason, if treatment goals not met, if there is a change in medical status, if patient makes no progress towards goals or if patient is discharged from hospital.  The above assessment, treatment plan, treatment alternatives and goals were discussed and mutually agreed upon: by patient  Lorie Phenix 07/25/2019,  12:23 PM

## 2019-07-25 NOTE — Progress Notes (Signed)
[place] for Coumadin  Indication: LVAD  Allergies  Allergen Reactions  . Codeine Hives    Patient Measurements: Height: 5\' 9"  (175.3 cm) Weight: 84.1 kg (185 lb 6.5 oz) IBW/kg (Calculated) : 70.7 Heparin Dosing Weight: ~ 92 kg  Vital Signs: Temp: 97.8 F (36.6 C) (07/24 0744) Temp Source: Oral (07/24 0744) BP: 81/66 (07/24 0744) Pulse Rate: 74 (07/24 0744)  Labs: Recent Labs    07/23/19 0243 07/23/19 0243 07/24/19 0302 07/24/19 1838 07/25/19 1514  HGB 12.2*  --  11.8*  --   --   HCT 39.3  --  38.0*  --   --   PLT 285  --  259  --   --   LABPROT 25.9*   < > 31.7* 31.4* 26.1*  INR 2.5*   < > 3.2* 3.2* 2.5*  CREATININE 1.18  --  1.09  --   --    < > = values in this interval not displayed.    Estimated Creatinine Clearance: 76.6 mL/min (by C-G formula based on SCr of 1.09 mg/dL).  Assessment: 54 yo male s/p LVAD HM3 placement 6/24, followed by IABP 7/8-7/13.  Pharmacy asked to dose anticoagulation with IV heparin and Coumadin. No overt bleeding or complications noted.    INR had increased to 3.2 after dose increases and not eating as much for the last few days- INR today came back therapeutic at 2.5. Previously had been sensitive to dose increases and was being managed conservatively. CBC on last check was stable. LDH 194.   Received 2 ensure supplements yesterday with 25% of meals documented.   Goal of Therapy:  INR 2-2.5  Monitor platelets by anticoagulation protocol: Yes   Plan:  -Will order warfarin 1 mg tonight -Limit ensure max 1/day in rehab  -Daily INR, CBC, LDH, s/sx of bleeding, HL  Antonietta Jewel, PharmD, BCCCP Clinical Pharmacist  Phone: [phone] 07/25/2019 3:50 PM  Please check AMION for all [place] phone numbers After 10:00 PM, call [place] [phone]

## 2019-07-26 DIAGNOSIS — Z7901 Long term (current) use of anticoagulants: Secondary | ICD-10-CM

## 2019-07-26 LAB — COOXEMETRY PANEL
Carboxyhemoglobin: 2 % — ABNORMAL HIGH (ref 0.5–1.5)
Methemoglobin: 1.2 % (ref 0.0–1.5)
O2 Saturation: 56.2 %
Total hemoglobin: 11.4 g/dL — ABNORMAL LOW (ref 12.0–16.0)

## 2019-07-26 LAB — MAGNESIUM: Magnesium: 1.8 mg/dL (ref 1.7–2.4)

## 2019-07-26 LAB — PROTIME-INR
INR: 2 — ABNORMAL HIGH (ref 0.8–1.2)
Prothrombin Time: 22 seconds — ABNORMAL HIGH (ref 11.4–15.2)

## 2019-07-26 LAB — GLUCOSE, CAPILLARY: Glucose-Capillary: 109 mg/dL — ABNORMAL HIGH (ref 70–99)

## 2019-07-26 MED ORDER — MAGNESIUM SULFATE 2 GM/50ML IV SOLN
2.0000 g | Freq: Once | INTRAVENOUS | Status: AC
  Administered 2019-07-26: 2 g via INTRAVENOUS
  Filled 2019-07-26: qty 50

## 2019-07-26 MED ORDER — WARFARIN SODIUM 3 MG PO TABS
3.0000 mg | ORAL_TABLET | Freq: Once | ORAL | Status: AC
  Administered 2019-07-26: 3 mg via ORAL
  Filled 2019-07-26: qty 1

## 2019-07-26 MED ORDER — ENSURE ENLIVE PO LIQD: 237.0000 mL | Freq: Every day | ORAL | Status: DC

## 2019-07-26 NOTE — Plan of Care (Signed)
  Problem: Consults Goal: RH GENERAL PATIENT EDUCATION Description: See Patient Education module for education specifics. Outcome: Progressing   Problem: RH BOWEL ELIMINATION Goal: RH STG MANAGE BOWEL WITH ASSISTANCE Description: STG Manage Bowel with Assistance. Mod I Outcome: Progressing   Problem: RH SKIN INTEGRITY Goal: RH STG ABLE TO PERFORM INCISION/WOUND CARE W/ASSISTANCE Description: STG Able To Perform Incision/Wound Care With Assistance. Min Outcome: Progressing   Problem: RH SAFETY Goal: RH STG ADHERE TO SAFETY PRECAUTIONS W/ASSISTANCE/DEVICE Description: STG Adhere to Safety Precautions With Assistance/Device. Mod I Outcome: Progressing   Problem: RH PAIN MANAGEMENT Goal: RH STG PAIN MANAGED AT OR BELOW PT'S PAIN GOAL Description: Less than 3 Outcome: Progressing

## 2019-07-26 NOTE — Progress Notes (Signed)
[place] for Coumadin  Indication: LVAD  Allergies  Allergen Reactions  . Codeine Hives    Patient Measurements: Height: 5\' 9"  (175.3 cm) Weight: 86 kg (189 lb 9.5 oz) IBW/kg (Calculated) : 70.7 Heparin Dosing Weight: ~ 92 kg  Vital Signs: Temp: 98.5 F (36.9 C) (07/25 1352) Temp Source: Oral (07/25 1352) BP: 85/71 (07/25 1352) Pulse Rate: 79 (07/25 1352)  Labs: Recent Labs    07/24/19 0302 07/24/19 0302 07/24/19 1838 07/25/19 1514 07/26/19 0423  HGB 11.8*  --   --   --   --   HCT 38.0*  --   --   --   --   PLT 259  --   --   --   --   LABPROT 31.7*   < > 31.4* 26.1* 22.0*  INR 3.2*   < > 3.2* 2.5* 2.0*  CREATININE 1.09  --   --  1.05  --    < > = values in this interval not displayed.    Estimated Creatinine Clearance: 86.3 mL/min (by C-G formula based on SCr of 1.05 mg/dL).  Assessment: 54 yo male s/p LVAD HM3 placement 6/24, followed by IABP 7/8-7/13.  Pharmacy asked to dose anticoagulation with IV heparin and Coumadin. No overt bleeding or complications noted.    INR trending down from 2.5 to 2 today, remains therapeutic on low end of goal, after held dose on 7/23 and lower dose on restart 7/24. CBC on last check was stable. LDH 194.   Received 1 ensure supplement yesterday with 40% of meals documented.   Goal of Therapy:  INR 2-2.5  Monitor platelets by anticoagulation protocol: Yes   Plan:  -Will order warfarin 3 mg tonight -Consider limiting ensure max 1/day in rehab  -Daily INR, CBC, LDH, s/sx of bleeding, HL  Antonietta Jewel, PharmD, BCCCP Clinical Pharmacist  Phone: [phone] 07/26/2019 2:20 PM  Please check AMION for all [place] phone numbers After 10:00 PM, call [place] [phone]

## 2019-07-26 NOTE — Progress Notes (Signed)
PHYSICAL MEDICINE & REHABILITATION PROGRESS NOTE  Subjective/Complaints: Patient seen sitting up in bed this morning.  He states he slept well overnight.  He states he had good first day of therapies yesterday.  Discussed with nursing, who states patient with unsteady gait with ambulation to the restroom.  He was seen by heart failure yesterday, notes reviewed-no changes.  ROS: Denies CP, SOB, N/V/D  Objective: Vital Signs: Blood pressure (!) 85/71, pulse 62, temperature 98 F (36.7 C), temperature source Oral, resp. rate 18, height 5\' 9"  (1.753 m), weight 86 kg, SpO2 92 %. No results found. Recent Labs    07/24/19 0302  WBC 8.9  HGB 11.8*  HCT 38.0*  PLT 259   Recent Labs    07/24/19 0302 07/25/19 1514  NA 132* 135  K 4.0 4.4  CL 98 101  CO2 27 25  GLUCOSE 98 93  BUN 13 16  CREATININE 1.09 1.05  CALCIUM 8.7* 8.6*    Physical Exam: BP (!) 85/71 (BP Location: Left Arm)   Pulse 62   Temp 98 F (36.7 C) (Oral)   Resp 18   Ht 5\' 9"  (1.753 m)   Wt 86 kg   SpO2 92%   BMI 28.00 kg/m  Constitutional: No distress . Vital signs reviewed. HENT: Normocephalic.  Atraumatic. Eyes: EOMI. No discharge. Cardiovascular: No JVD.  + LVAD.  + Hum. Respiratory: Normal effort.  No stridor.  Bilaterally clear to auscultation. GI: Non-distended.  BS +. Skin: Warm and dry.  Intact. Psych: Normal mood.  Normal behavior. Musc: No edema in extremities.  No tenderness in extremities. Neuro: Alert Motor: Grossly 4+/5 throughout, improving  Assessment/Plan: 1. Functional deficits secondary to cardiac debility which require 3+ hours per day of interdisciplinary therapy in a comprehensive inpatient rehab setting.  Physiatrist is providing close team supervision and 24 hour management of active medical problems listed below.  Physiatrist and rehab team continue to assess barriers to discharge/monitor patient progress toward functional and medical goals  Care  Tool:  Bathing    Body parts bathed by patient: Right arm, Left arm, Chest, Abdomen, Front perineal area, Buttocks, Right upper leg, Left upper leg, Right lower leg, Left lower leg, Face         Bathing assist Assist Level: Contact Guard/Touching assist     Upper Body Dressing/Undressing Upper body dressing   What is the patient wearing?: Pull over shirt    Upper body assist Assist Level: Supervision/Verbal cueing    Lower Body Dressing/Undressing Lower body dressing      What is the patient wearing?: Pants     Lower body assist Assist for lower body dressing: Contact Guard/Touching assist     Toileting Toileting Toileting Activity did not occur (Clothing management and hygiene only): N/A (no void or bm)  Toileting assist Assist for toileting: Contact Guard/Touching assist     Transfers Chair/bed transfer  Transfers assist     Chair/bed transfer assist level: Minimal Assistance - Patient > 75%     Locomotion Ambulation   Ambulation assist      Assist level: Minimal Assistance - Patient > 75% Assistive device: Hand held assist Max distance: 100   Walk 10 feet activity   Assist     Assist level: Minimal Assistance - Patient > 75% Assistive device: No Device   Walk 50 feet activity   Assist    Assist level: Minimal Assistance - Patient > 75% Assistive device: No Device    Walk 150 feet  activity   Assist Walk 150 feet activity did not occur: Safety/medical concerns         Walk 10 feet on uneven surface  activity   Assist     Assist level: Minimal Assistance - Patient > 75% Assistive device: Hand held assist   Wheelchair     Assist   Type of Wheelchair: Manual    Wheelchair assist level: Supervision/Verbal cueing Max wheelchair distance: 150    Wheelchair 50 feet with 2 turns activity    Assist        Assist Level: Supervision/Verbal cueing   Wheelchair 150 feet activity     Assist     Assist  Level: Supervision/Verbal cueing      Medical Problem List and Plan: 1.  Debility secondary to STEMI/LVAD 06/25/2019.  Sternal precautions  Continue CIR 2.  Antithrombotics: -DVT/anticoagulation: Coumadin  INR therapeutic on 7/25             -antiplatelet therapy: Aspirin 81 mg daily 3. Pain Management: Neurontin 300 mg 3 times daily, oxycodone/tramadol as needed  Controlled on 7/25             Monitor with increased exertion 4. Mood: Melatonin 5 mg nightly, Klonopin 0.5 mg twice daily, Xanax as needed, trazodone             -antipsychotic agents: N/A 5. Neuropsych: This patient is capable of making decisions on his own behalf. 6. Skin/Wound Care: Routine skin checks 7. Fluids/Electrolytes/Nutrition: Routine in and outs.   8.  VF requiring DCCV.  Follow-up cardiology services.  Continue amiodarone 400 mg twice daily as well as Lanoxin 0.125 mg daily, Ranexa 500 mg twice daily 9.  Systolic congestive heart failure.  Monitor for any signs of fluid overload. Filed Weights   07/24/19 1614 07/25/19 0000 07/26/19 0651  Weight: 85.2 kg 84.1 kg 86 kg   ?  Reliability on 7/25 10.  Hyperlipidemia.  Continue Lipitor 11.  Tobacco abuse.  Counseling 12.  Renal cell carcinoma.  MRI of abdomen showed a 1.8 cm right renal lesion.  Patient would follow-up outpatient with urology services. 13. Constipation.  MiraLAX daily, Colace 100 mg daily.    Improving  Adjust bowel meds as necessary 14.  Hypotension  On digoxin 0.125  On Klonopin, will consider decreasing asymptomatic  Remain soft on 7/25 15.  Hyponatremia  Sodium 135 on 7/24, improving  Labs ordered for tomorrow   LOS: 2 days A FACE TO FACE EVALUATION WAS PERFORMED  Temima Kutsch Lorie Phenix 07/26/2019, 9:20 AM

## 2019-07-26 NOTE — Progress Notes (Signed)
Patient ID: Aaron [name], male   DOB: [birth year], 54 y.o.   MRN: [id number]     Advanced Heart Failure Rounding Note  PCP-Cardiologist: No primary care provider on file.   Subjective:    Continues to feel better. Says they gave him a good workout yesterday. Appetite and dizziness improving. No SOB. INR 2.0 no bleeding  Co-ox 47% -> 56%  LVAD Interrogation HM III: Speed: 5200 Flow: 4.5  PI: 2.7 Power: 3.7.  VAD interrogated personally. Parameters stable. No PI events   Objective:   Weight Range: 86 kg Body mass index is 28 kg/m.   Vital Signs:   Temp:  [98.1 F (36.7 C)-98.2 F (36.8 C)] 98.1 F (36.7 C) (07/25 0457) Pulse Rate:  [54-87] 54 (07/25 0457) Resp:  [18-20] 18 (07/25 0457) BP: (82-93)/(47-68) 84/68 (07/25 0457) SpO2:  [93 %-97 %] 97 % (07/25 0457) Weight:  [86 kg] 86 kg (07/25 0651) Last BM Date: 07/25/19  Weight change: Filed Weights   07/24/19 1614 07/25/19 0000 07/26/19 0651  Weight: 85.2 kg 84.1 kg 86 kg    Intake/Output:   Intake/Output Summary (Last 24 hours) at 07/26/2019 0840 Last data filed at 07/26/2019 0807 Gross per 24 hour  Intake 490 ml  Output --  Net 490 ml      Physical Exam   General:  NAD.  HEENT: normal  Neck: supple. JVP not elevated.  Carotids 2+ bilat; no bruits. No lymphadenopathy or thryomegaly appreciated. Cor: LVAD hum.  Lungs: Clear. Abdomen: soft, nontender, non-distended. No hepatosplenomegaly. No bruits or masses. Good bowel sounds. Driveline site clean. Anchor in place.  Extremities: no cyanosis, clubbing, rash. Warm no edema  Neuro: alert & oriented x 3. No focal deficits. Moves all 4 without problem     Telemetry   Not connected    Labs    CBC Recent Labs    07/24/19 0302  WBC 8.9  HGB 11.8*  HCT 38.0*  MCV 93.8  PLT 440   Basic Metabolic Panel Recent Labs    07/24/19 0302 07/25/19 1514 07/26/19 0423  NA 132* 135  --   K 4.0 4.4  --   CL 98 101  --   CO2 27 25  --   GLUCOSE 98 93  --    BUN 13 16  --   CREATININE 1.09 1.05  --   CALCIUM 8.7* 8.6*  --   MG 2.0  --  1.8   Liver Function Tests No results for input(s): AST, ALT, ALKPHOS, BILITOT, PROT, ALBUMIN in the last 72 hours. No results for input(s): LIPASE, AMYLASE in the last 72 hours. Cardiac Enzymes No results for input(s): CKTOTAL, CKMB, CKMBINDEX, TROPONINI in the last 72 hours.  BNP: BNP (last 3 results) Recent Labs    07/09/19 0313 07/16/19 0346 07/23/19 0243  BNP 1,815.2* 3,431.7* 1,811.9*    ProBNP (last 3 results) No results for input(s): PROBNP in the last 8760 hours.   D-Dimer No results for input(s): DDIMER in the last 72 hours. Hemoglobin A1C No results for input(s): HGBA1C in the last 72 hours. Fasting Lipid Panel No results for input(s): CHOL, HDL, LDLCALC, TRIG, CHOLHDL, LDLDIRECT in the last 72 hours. Thyroid Function Tests No results for input(s): TSH, T4TOTAL, T3FREE, THYROIDAB in the last 72 hours.  Invalid input(s): FREET3  Other results:   Imaging    No results found.   Medications:     Scheduled Medications:  amiodarone  400 mg Oral BID   aspirin  81 mg Oral Daily   atorvastatin  80 mg Oral Daily   Chlorhexidine Gluconate Cloth  6 each Topical Daily   clonazePAM  0.5 mg Oral BID   digoxin  0.125 mg Oral Daily   feeding supplement (ENSURE ENLIVE)  237 mL Oral BID BM   folic acid  1 mg Oral Daily   gabapentin  300 mg Oral TID   hydrocortisone cream   Topical TID   melatonin  5 mg Oral QPC supper   multivitamin with minerals  1 tablet Oral Daily   pantoprazole  40 mg Oral Daily   ranolazine  500 mg Oral BID   sildenafil  20 mg Oral TID   thiamine  100 mg Oral Daily   Warfarin - Pharmacist Dosing Inpatient   Does not apply q1600    Infusions:   PRN Medications: acetaminophen, ALPRAZolam, bisacodyl, calcium carbonate, oxyCODONE, polyvinyl alcohol, sodium chloride flush, sorbitol, traMADol, traZODone    Patient Profile   Aaron [name]  Fata --post MI cardiogenic shock/ optimization prior to anticipated CABG, at the request of Dr. Marlou Porch, Cardiology.    Assessment/Plan   1. Acute Anterolateral STEMI/ Multivessel CAD  - S/P HMIII VAD 6/24 - No s/s ischemia currently  2.  Acute Systolic Heart Failure s/p VAD - EF 20%  - HMIII LVAD  Placed 6/24 - Has had RV failure post VAD - Co-ox improved at 56% today - Continue sildenafil & digoxin - VAD interrogated personally. Parameters stable. - INR 2.0. Discussed dosing with PharmD personally.  3. Refractory VT/VF  - Quiescent. Continue amio - Keep K > 4.0 Mg > 2.0  - Supp mag  4. Renal mass = RCC - MRI abdomen- 1.8 cm right renal lesion, most consistent with renal cell carcinoma. No evidence of renal vein involvement or nodal metastasis. - Discussed w/ Urology and R RCC is small and will just require surveillance.  Ok to proceed with VAD from their standpoint.   5.  Sleep Apnea - frequent apnea at night  - needs formal sleep study    6. Debility - Appreciate CIR's efforts  Glori Bickers, MD  07/26/2019 8:40 AM

## 2019-07-27 ENCOUNTER — Inpatient Hospital Stay (HOSPITAL_COMMUNITY): Payer: BC Managed Care – PPO | Admitting: Physical Therapy

## 2019-07-27 ENCOUNTER — Inpatient Hospital Stay (HOSPITAL_COMMUNITY): Payer: BC Managed Care – PPO

## 2019-07-27 LAB — COMPREHENSIVE METABOLIC PANEL
ALT: 104 U/L — ABNORMAL HIGH (ref 0–44)
AST: 67 U/L — ABNORMAL HIGH (ref 15–41)
Albumin: 2.8 g/dL — ABNORMAL LOW (ref 3.5–5.0)
Alkaline Phosphatase: 117 U/L (ref 38–126)
Anion gap: 8 (ref 5–15)
BUN: 13 mg/dL (ref 6–20)
CO2: 24 mmol/L (ref 22–32)
Calcium: 8.5 mg/dL — ABNORMAL LOW (ref 8.9–10.3)
Chloride: 101 mmol/L (ref 98–111)
Creatinine, Ser: 0.97 mg/dL (ref 0.61–1.24)
GFR calc Af Amer: >60 mL/min (ref >60)
GFR calc non Af Amer: >60 mL/min (ref >60)
Glucose, Bld: 99 mg/dL (ref 70–99)
Potassium: 4.1 mmol/L (ref 3.5–5.1)
Sodium: 133 mmol/L — ABNORMAL LOW (ref 135–145)
Total Bilirubin: 1 mg/dL (ref 0.3–1.2)
Total Protein: 6 g/dL — ABNORMAL LOW (ref 6.5–8.1)

## 2019-07-27 LAB — CBC WITH DIFFERENTIAL/PLATELET
Abs Immature Granulocytes: 0.03 10*3/uL (ref 0.00–0.07)
Basophils Absolute: 0.1 10*3/uL (ref 0.0–0.1)
Basophils Relative: 1 %
Eosinophils Absolute: 0.2 10*3/uL (ref 0.0–0.5)
Eosinophils Relative: 2 %
HCT: 34.4 % — ABNORMAL LOW (ref 39.0–52.0)
Hemoglobin: 10.7 g/dL — ABNORMAL LOW (ref 13.0–17.0)
Immature Granulocytes: 0 %
Lymphocytes Relative: 16 %
Lymphs Abs: 1.4 10*3/uL (ref 0.7–4.0)
MCH: 29.7 pg (ref 26.0–34.0)
MCHC: 31.1 g/dL (ref 30.0–36.0)
MCV: 95.6 fL (ref 80.0–100.0)
Monocytes Absolute: 0.7 10*3/uL (ref 0.1–1.0)
Monocytes Relative: 8 %
Neutro Abs: 6.3 10*3/uL (ref 1.7–7.7)
Neutrophils Relative %: 73 %
Platelets: 215 10*3/uL (ref 150–400)
RBC: 3.6 MIL/uL — ABNORMAL LOW (ref 4.22–5.81)
RDW: 16.8 % — ABNORMAL HIGH (ref 11.5–15.5)
WBC: 8.6 10*3/uL (ref 4.0–10.5)
nRBC: 0 % (ref 0.0–0.2)

## 2019-07-27 LAB — MAGNESIUM: Magnesium: 2.1 mg/dL (ref 1.7–2.4)

## 2019-07-27 LAB — GLUCOSE, CAPILLARY
Glucose-Capillary: 89 mg/dL (ref 70–99)
Glucose-Capillary: 102 mg/dL — ABNORMAL HIGH (ref 70–99)
Glucose-Capillary: 138 mg/dL — ABNORMAL HIGH (ref 70–99)

## 2019-07-27 LAB — PROTIME-INR
INR: 1.5 — ABNORMAL HIGH (ref 0.8–1.2)
Prothrombin Time: 17.8 seconds — ABNORMAL HIGH (ref 11.4–15.2)

## 2019-07-27 MED ORDER — WARFARIN SODIUM 3 MG PO TABS
6.0000 mg | ORAL_TABLET | Freq: Once | ORAL | Status: AC
  Administered 2019-07-27: 6 mg via ORAL
  Filled 2019-07-27: qty 2

## 2019-07-27 MED ORDER — CHLORHEXIDINE GLUCONATE CLOTH 2 % EX PADS
6.0000 | MEDICATED_PAD | Freq: Two times a day (BID) | CUTANEOUS | Status: DC
  Administered 2019-07-27 – 2019-07-28 (×2): 6 via TOPICAL

## 2019-07-27 NOTE — Progress Notes (Signed)
[facility] for Coumadin  Indication: LVAD  Allergies  Allergen Reactions  . Codeine Hives    Patient Measurements: Height: 5\' 9"  (175.3 cm) Weight: 86 kg (189 lb 9.5 oz) IBW/kg (Calculated) : 70.7 Heparin Dosing Weight: ~ 92 kg  Vital Signs: Temp: 98 F (36.7 C) (07/26 0504) BP: 118/72 (07/26 0800) Pulse Rate: 72 (07/26 0800)  Labs: Recent Labs    07/25/19 1514 07/26/19 0423 07/27/19 0400  HGB  --   --  10.7*  HCT  --   --  34.4*  PLT  --   --  215  LABPROT 26.1* 22.0* 17.8*  INR 2.5* 2.0* 1.5*  CREATININE 1.05  --  0.97    Estimated Creatinine Clearance: 93.5 mL/min (by C-G formula based on SCr of 0.97 mg/dL).  Assessment: 54 yo male s/p LVAD HM3 placement 6/24, followed by [facility] 7/8-7/13.  Pharmacy asked to dose anticoagulation with IV heparin and Coumadin. No overt bleeding or complications noted.    INR down to 1.5 likely due to combination of improving diet and held dose 7/23. Discussed with MD, will boost warfarin tonight in effort to avoid IV heparin.  Goal of Therapy:  INR 2-2.5  Monitor platelets by anticoagulation protocol: Yes   Plan:  -Warfarin 6mg  PO x1 tonight -Daily INR   Arrie Senate, PharmD, BCPS Clinical Pharmacist [phone] Please check AMION for all [facility] Pharmacy numbers 07/27/2019

## 2019-07-27 NOTE — Progress Notes (Signed)
Inpatient Rehabilitation  Patient information reviewed and entered into eRehab system by Tomicka Lover M. Kassondra Geil, M.A., CCC/SLP, PPS Coordinator.  Information including medical coding, functional ability and quality indicators will be reviewed and updated through discharge.    

## 2019-07-27 NOTE — Discharge Instructions (Signed)
Inpatient Rehab Discharge Instructions  [place] Discharge date and time: No discharge date for patient encounter.   Activities/Precautions/ Functional Status: Activity: Sternal precautions Diet: Low sodium Wound Care: keep wound clean and dry Functional status:  ___ No restrictions     ___ Walk up steps independently ___ 24/7 supervision/assistance   ___ Walk up steps with assistance ___ Intermittent supervision/assistance  ___ Bathe/dress independently ___ Walk with walker     _x__ Bathe/dress with assistance ___ Walk Independently    ___ Shower independently ___ Walk with assistance    ___ Shower with assistance ___ No alcohol     ___ Return to work/school ________  Special Instructions: No driving smoking or alcohol   Home health nurse to check INR on   results to [place] [phone] fax [phone]   My questions have been answered and I understand these instructions. I will adhere to these goals and the provided educational materials after my discharge from the hospital.  Patient/Caregiver Signature _______________________________ Date __________  Clinician Signature _______________________________________ Date __________  Please bring this form and your medication list with you to all your follow-up doctor's appointments.

## 2019-07-27 NOTE — Progress Notes (Signed)
Occupational Therapy Session Note  Patient Details  Name: Aaron [name] MRN: [id number] Date of Birth: 1965/09/30  Today's Date: 07/27/2019 OT Individual Time: 1100-1200 OT Individual Time Calculation (min): 60 min    Short Term Goals: Week 1:  OT Short Term Goal 1 (Week 1): STGs=LTGs due to ELOS  Skilled Therapeutic Interventions/Progress Updates:    Pt resting in bed upon arrival and ready for therapy.  Supine>sit EOB with supervision.  Pt switched LVAD to battery power independently.  Pt amb with RW to Day Room and completed 5 mins on Nustep at level 1, BLE only. Pt rated activity 13 on Borg scale. Pt SOB at end of activity and required rest break before amb with RW to ADL apartment from Day Room and engaged in simple home mgmt tasks with focus on functional standing balance. Pt also engaged in Dynavision task while standing on AirEx. Pt commented that he could "really feel it" in his calves. Pt amb with RW back to room and switched off from battery power independently. Pt remained in bed with all needs within reach and bed alarm activated.   Therapy Documentation Precautions:  Restrictions Weight Bearing Restrictions: No   Pain: Pt denies pain this morning  Therapy/Group: Individual Therapy  Leroy Libman 07/27/2019, 12:03 PM

## 2019-07-27 NOTE — Progress Notes (Signed)
Slept good. Without complaint of pain. Ambulates to BR with steadying assist. Drivetrain dressing CD & I. Sternal incision OTA and with glue. Chest tube sights look good. Aaron [name] A

## 2019-07-27 NOTE — Progress Notes (Signed)
Physical Therapy Session Note  Patient Details  Name: Aaron [name] MRN: [id number] Date of Birth: [birth year]  Today's Date: 07/27/2019 PT Individual Time: 1300-1400 PT Individual Time Calculation (min): 60 min   Short Term Goals: Week 1:  PT Short Term Goal 1 (Week 1): STG=LTG due to ELOS  Skilled Therapeutic Interventions/Progress Updates:    Pt received seated EOB, agreeable to PT session. No complaints of pain. Pt is Supervision with min cueing needed to transfer LVAD from wall power to portable batteries. Pt is Supervision for sit to stand throughout session. Ambulation x 150 ft with RW at close Supervision level. Trial ambulation with no AD, 2 x 90 ft with CGA. Pt initially exhibits some ataxia with gait without use of AD but balance improves during ambulation with no AD. Remainder of session focused on standing balance with no AD. Standing alt L/R 6" step-taps 2 x 10 reps with min HHA, static standing balance while performing ball toss against rebounder with normal stance, Romberg stance, and modified tandem stance with min A needed for balance x 30 reps each. Sidesteps L/R 2 x 10 ft with min A for balance for LE strengthening and balance training. Pt returned to room via ambulation with RW at Supervision level. Pt is Supervision to reconnect LVAD to wall power. Pt left seated EOB with needs in reach, bed alarm in place at end of session.  Therapy Documentation Precautions:  Restrictions Weight Bearing Restrictions: No   Therapy/Group: Individual Therapy   Excell Seltzer, PT, DPT  07/27/2019, 3:31 PM

## 2019-07-27 NOTE — Progress Notes (Signed)
Occupational Therapy Session Note  Patient Details  Name: Aaron [name] MRN: [id number] Date of Birth: 05/01/1965  Today's Date: 07/27/2019 OT Individual Time: 0800-0900 OT Individual Time Calculation (min): 60 min    Short Term Goals: Week 1:  OT Short Term Goal 1 (Week 1): STGs=LTGs due to ELOS  Skilled Therapeutic Interventions/Progress Updates:    Pt sitting EOB upon arrival with RNs present. Pt's drive line anchor had become loose and RN notified. RN Collie Siad retaped dirve line anchor and stated she would contact LVAD team. Pt engaged in dressing tasks with sit<>stand and grooming tasks standing at sink. Pt switched to battery power independently.  Pt amb with RW to ADL apartment and practiced bed mobility with supervision.  Pt required rest break in ADL apartment. Pt returned to room and returned to bed.  Pt remained in bed with all needs within reach and bed alarm activated. Pt completed all tasks this morning with supervision and multiple rest breaks.   Therapy Documentation Precautions:  Restrictions Weight Bearing Restrictions: No Pain: Pain Assessment Pain Scale: 0-10 Pain Score: 0-No pain   Therapy/Group: Individual Therapy  Leroy Libman 07/27/2019, 9:05 AM

## 2019-07-27 NOTE — Progress Notes (Signed)
PHYSICAL MEDICINE & REHABILITATION PROGRESS NOTE  Subjective/Complaints:  Pt reports ready for therapy- no more nausea- improved appetite- needs some anchors for drive line per nursing.  Had first time "firm"/normal stool this AM- since got to hospital- was normally diarrhea/loose up until now.   Denies vertigo/lightheadedness so far today..    ROS:  Pt denies SOB, abd pain, CP, N/V/C/D, and vision changes   Objective: Vital Signs: Blood pressure (!) 88/70, pulse 82, temperature 98.1 F (36.7 C), resp. rate 16, height 5\' 9"  (1.753 m), weight 86 kg, SpO2 98 %. No results found. Recent Labs    07/27/19 0400  WBC 8.6  HGB 10.7*  HCT 34.4*  PLT 215   Recent Labs    07/25/19 1514 07/27/19 0400  NA 135 133*  K 4.4 4.1  CL 101 101  CO2 25 24  GLUCOSE 93 99  BUN 16 13  CREATININE 1.05 0.97  CALCIUM 8.6* 8.5*    Physical Exam: BP (!) 88/70 (BP Location: Left Arm)   Pulse 82   Temp 98.1 F (36.7 C)   Resp 16   Ht 5\' 9"  (1.753 m)   Wt 86 kg   SpO2 98%   BMI 28.00 kg/m  Constitutional: No distress . Vital signs reviewed. Sitting up with OT and RN in room, sitting EOB, didn't remember interviewer, appropriate otherwise, NAD HENT: Normocephalic.  Atraumatic. Eyes: conjugate gaze Cardiovascular: (+)LVAD hum Respiratory: CTA B/L- no W/R/R- good air movement- no rhonchi heard GI: Soft, NT, ND, (+)BS  Skin: Warm and dry.  Intact. Incision on chest healing- look good Psych: appropriate, but slightly flat- could be overwhelmed by 3-4 people in room Musc: No edema in extremities.  No tenderness in extremities. Neuro: Alert Motor: Grossly 4+/5 throughout, improving  Assessment/Plan: 1. Functional deficits secondary to cardiac debility which require 3+ hours per day of interdisciplinary therapy in a comprehensive inpatient rehab setting.  Physiatrist is providing close team supervision and 24 hour management of active medical problems listed  below.  Physiatrist and rehab team continue to assess barriers to discharge/monitor patient progress toward functional and medical goals  Care Tool:  Bathing    Body parts bathed by patient: Right arm, Left arm, Chest, Abdomen, Front perineal area, Buttocks, Right upper leg, Left upper leg, Right lower leg, Left lower leg, Face         Bathing assist Assist Level: Contact Guard/Touching assist     Upper Body Dressing/Undressing Upper body dressing   What is the patient wearing?: Pull over shirt    Upper body assist Assist Level: Set up assist    Lower Body Dressing/Undressing Lower body dressing      What is the patient wearing?: Underwear/pull up, Pants     Lower body assist Assist for lower body dressing: Supervision/Verbal cueing     Toileting Toileting Toileting Activity did not occur (Clothing management and hygiene only): N/A (no void or bm)  Toileting assist Assist for toileting: Contact Guard/Touching assist     Transfers Chair/bed transfer  Transfers assist     Chair/bed transfer assist level: Minimal Assistance - Patient > 75%     Locomotion Ambulation   Ambulation assist      Assist level: Minimal Assistance - Patient > 75% Assistive device: Hand held assist Max distance: 100   Walk 10 feet activity   Assist     Assist level: Minimal Assistance - Patient > 75% Assistive device: No Device   Walk 50 feet activity  Assist    Assist level: Minimal Assistance - Patient > 75% Assistive device: No Device    Walk 150 feet activity   Assist Walk 150 feet activity did not occur: Safety/medical concerns         Walk 10 feet on uneven surface  activity   Assist     Assist level: Minimal Assistance - Patient > 75% Assistive device: Hand held assist   Wheelchair     Assist Will patient use wheelchair at discharge?: Yes (Per PT long-term goals ) Type of Wheelchair: Manual    Wheelchair assist level:  Supervision/Verbal cueing Max wheelchair distance: 150    Wheelchair 50 feet with 2 turns activity    Assist        Assist Level: Supervision/Verbal cueing   Wheelchair 150 feet activity     Assist     Assist Level: Supervision/Verbal cueing      Medical Problem List and Plan: 1.  Debility secondary to STEMI/LVAD 06/25/2019.  Sternal precautions  Continue CIR 2.  Antithrombotics: -DVT/anticoagulation: Coumadin  INR therapeutic on 7/25  7/26- INR dropped to 1.5- boosting warfarin dose to avoid IV heparin, if possible.             -antiplatelet therapy: Aspirin 81 mg daily 3. Pain Management: Neurontin 300 mg 3 times daily, oxycodone/tramadol as needed  7/26- pain controlled per pt- cont' regimen             Monitor with increased exertion 4. Mood: Melatonin 5 mg nightly, Klonopin 0.5 mg twice daily, Xanax as needed, trazodone  7/26- appears a little flat/overwhelmed- con't to monitor closely-             -antipsychotic agents: N/A 5. Neuropsych: This patient is capable of making decisions on his own behalf. 6. Skin/Wound Care: Routine skin checks  7/26- needs drive line nachors per nursing- they will call LVAD team. 7. Fluids/Electrolytes/Nutrition: Routine in and outs.   8.  VF requiring DCCV.  Follow-up cardiology services.  Continue amiodarone 400 mg twice daily as well as Lanoxin 0.125 mg daily, Ranexa 500 mg twice daily 9.  Systolic congestive heart failure.  Monitor for any signs of fluid overload. Filed Weights   07/24/19 1614 07/25/19 0000 07/26/19 0651  Weight: 85.2 kg 84.1 kg 86 kg   7/26- no weight today- will speak with nursing 10.  Hyperlipidemia.  Continue Lipitor 11.  Tobacco abuse.  Counseling 12.  Renal cell carcinoma.  MRI of abdomen showed a 1.8 cm right renal lesion.  Patient would follow-up outpatient with urology services. 13. Constipation.  MiraLAX daily, Colace 100 mg daily.    Improving  Adjust bowel meds as necessary 14.   Hypotension  On digoxin 0.125  On Klonopin, will consider decreasing asymptomatic  7/26- BP 88/70- per heart failure team 15.  Hyponatremia  Sodium 135 on 7/24, improving  7/26- Na down slightly to 133- will monitor 16. Per heart failure team, keep K+ >4.0 (is 4.1) and Mg >2.0 (is 2.1)- con't repletion as required- not on anything standing at this time.     LOS: 3 days A FACE TO FACE EVALUATION WAS PERFORMED  Deyani Hegarty 07/27/2019, 1:24 PM

## 2019-07-27 NOTE — Progress Notes (Signed)
LVAD Coordinator Rounding Note:  Transferred from [facility] ED on 06/17/19 with STEMI. Cath revealed multi-vessel CAD, wire would not cross to open LAD; IABP placed and pt transferred here for evaluation for CABG.   Dr. Orvan Seen evaluated and found he was not amenable to surgical or percutaneous revascularization due to poor targets. With severe depressed LV function and little coronary perfusion, LVAD was recommended.   HM III LVAD implanted on 06/25/19 by Dr. Orvan Seen under Destination Therapy criteria due to recent smoking history.  Developed sepsis/PNA with respiratory failure. Had refractory VT/VF. IABP/swan placed 07/09/19 - discontinued 07/14/19  Pt's transferred to CIR on 07/24/19. Pt lying in bed. States that he feels like he is getting stronger and was able to walk without the walker today.   Vital signs: Temp: 98.1 HR: 82 SR  Auto BP:  88/70 (78) Doppler:  84 O2 Sat: 98% on RA Wt:207>225.7>222.6>214.3.....193.7>187.3>184.3>184.3>184.3>183.4>189.6 lbs  LVAD parameters: Speed:  5200 Flow: 4.4 Power:  3.7w PI: 2.2 Hct: 34  Alarms: none Events: 12 PI events on 07/23/19   Fixed speed: 5200 Low speed limit: 4900   Drive Line:  Existing VAD dressing removed and site care performed using sterile technique. Drive line exit site cleaned with betadine swabs x 2 and rinsed with sterile saline, allowed to dry, and gauze dressing WITHOUT calcium aliginate or silver strip re-applied. No skin prep used to decrease chance of skin irritation.Exit site unincorporated, erythema around exit site improving. Small amount yellow drainage with no foul odor noted.  The velour is fully implanted at exit site. Anchor replaced.  Daily dressing changes per Coordinator, Nurse Davonna Belling, or trained caregiver. Next dressing change due: 07/08/2019.       Labs:  LDH trend: 660>398........375>338>320>317>257>257>244>256>233>194>  INR trend: 1.4>1.3.....1.5>1.3>1.3>1.4>1.8>2.5>3.2>1.5  Anticoagulation  Plan: -INR Goal:  2.0 - 2.5 -ASA Dose: 81 mg - will keep going for CAD   Blood Products:  -Intra-op - 06/25/19>>6 units FFP  - 07/09/19>>2 FFP - 07/10/19>>2 FFP  Device: N/A  Drips: None  Infection: - 07/09/19 - BCs>>negative - 07/09/19 - resp culture>>negative - 07/21/19 - DL wound culture>>negative  Arrythmias: was on Amiodarone pre implant for NSVT - 2 episodes of VF requiring emergent DC-CV on 07/02/19 - Recurrent VF on 7/2 and broke spontaneously.  - 7/3 Recurrent VF at 2a.-> emergent DC-CV.  - 07/07/2019 VT/Torsades -> repeat DC_CV - 07/08/2019 VF/Torsades x2--> DC-CV x2.  - 07/09/19 Multiple episodes of VT/VF -> IABP placed. AV pacing via external pacing wires - underlying rhythm bradycardia 40 bpm; external AV pacing wires remain intact  Respiratory: extubated 06/26/19 - re-intubated 07/09/19 for sepsis/pneumoni - extubated 07/14/19  Nitric Oxide: off 06/26/19  - Restarted 07/10/19 - Off 07/14/19  VAD Education: 1. Sister-Joanne is checked off to perform driveline dressing changes independently.  2. Discharge education completed 07/21/19 with patient, [name], and Otila Kluver.    Plan/Recommendations:  1. Call VAD pager if any VAD equipment or drive line issues. 2. Every day dressing changes per VAD Coordinator, Nurse Davonna Belling, or pts sister.  Tanda Rockers RN [place] Coordinator  Office: [phone]  24/7 Pager: [phone]

## 2019-07-27 NOTE — IPOC Note (Signed)
Overall Plan of Care [facility]) Patient Details Name: Aaron [name] MRN: [id number] DOB: Mar 01, 1965  Admitting Diagnosis: [facility] Problems: Principal Problem:   Debility Active Problems:   Hyponatremia   Drug-induced hypotension   Chronic anticoagulation     Functional Problem List: Nursing Bowel, Endurance, Medication Management, Pain, Safety  PT Balance, Endurance, Pain, Safety  OT Balance, Behavior, Safety, Skin Integrity, Endurance, Motor, Pain  SLP    TR         Basic ADL's: OT Grooming, Bathing, Dressing, Toileting     Advanced  ADL's: OT Simple Meal Preparation, Laundry     Transfers: PT Bed Mobility, Bed to Chair, Car, [employer], Floor  OT Toilet     Locomotion: PT Ambulation, Emergency planning/management officer, Stairs     Additional Impairments: OT None  SLP        TR      Anticipated Outcomes Item Anticipated Outcome  Self Feeding No goal  Swallowing      Basic self-care  Supervision/setup-Mod I  Toileting  Mod I   Bathroom Transfers Mod I  Bowel/Bladder  maintain regular pattern of emptying, decrease incidence of diarrhea  Transfers  Mod I with LRAD  Locomotion  Mod I with LRAD  Communication     Cognition     Pain  less than 3  Safety/Judgment  no falls, skin breakdown, infection   Therapy Plan: PT Intensity: Minimum of 1-2 x/day ,45 to 90 minutes PT Frequency: 5 out of 7 days PT Duration Estimated Length of Stay: 7-10 days OT Intensity: Minimum of 1-2 x/day, 45 to 90 minutes OT Frequency: 5 out of 7 days OT Duration/Estimated Length of Stay: 7-10 days     Due to the current state of emergency, patients may not be receiving their 3-hours of Medicare-mandated therapy.   Team Interventions: Nursing Interventions Patient/Family Education, Bowel Management, Disease Management/Prevention, Skin Care/Wound Management, Pain Management, Discharge Planning, Psychosocial Support  PT interventions Ambulation/gait training, Community  reintegration, Neuromuscular re-education, DME/adaptive equipment instruction, Psychosocial support, Stair training, Wheelchair propulsion/positioning, UE/LE Strength taining/ROM, Training and development officer, Discharge planning, Functional electrical stimulation, Pain management, Therapeutic Activities, Skin care/wound management, UE/LE Coordination activities, Disease management/prevention, Cognitive remediation/compensation, Functional mobility training, Patient/family education, Splinting/orthotics, Visual/perceptual remediation/compensation, Therapeutic Exercise  OT Interventions Balance/vestibular training, Discharge planning, Pain management, Self Care/advanced ADL retraining, Therapeutic Activities, UE/LE Coordination activities, Therapeutic Exercise, Skin care/wound managment, Patient/family education, Disease mangement/prevention, Functional mobility training, Community reintegration, Engineer, drilling, Psychosocial support, UE/LE Strength taining/ROM  SLP Interventions    TR Interventions    SW/CM Interventions     Barriers to Discharge MD  Medical stability, Home enviroment access/loayout, Wound care, Lack of/limited family support, Weight bearing restrictions and new LVAD!  Nursing      PT Inaccessible home environment, Decreased caregiver support, Home environment access/layout, Insurance for SNF coverage    [place], Other (comments) LVAD care  SLP      SW       Team Discharge Planning: Destination: PT-Home ,OT- Home , SLP-  Projected Follow-up: PT-Home health PT, OT-  Home health OT, SLP-  Projected Equipment Needs: PT-Rolling walker with 5" wheels, To be determined, OT- To be determined, SLP-  Equipment Details: PT- , OT-  Patient/family involved in discharge planning: PT- Patient,  OT-Patient, SLP-   MD ELOS: 7-10 days Medical Rehab Prognosis:  Good Assessment: Pt is a 54 yr old male with STEMI s/p LVAD 06/25/19- on sternal precautions-  On coumadin-  INR 1.5- trying to boost so  doesn't put on IV heparin.  Systolic CHF, and new dx of renal cell carcinoma- to be followed up outpt.   Goals- mod I to supervision  See Team Conference Notes for weekly updates to the plan of care

## 2019-07-27 NOTE — Progress Notes (Signed)
Patient ID: Aaron [name], male   DOB: 09/03/1965, 54 y.o.   MRN: [id number]     Advanced Heart Failure Rounding Note  PCP-Cardiologist: No primary care provider on file.   Subjective:    Working with [facility]. Feels he is getting stronger. Dizziness improved. No SOB, orthopnea or PND.   Co-ox 56% yesterday  LVAD Interrogation HM III: Speed: 5200 Flow: 4.2  PI: 3.2 Power: 4.0. VAD interrogated personally. Parameters stable.   Objective:   Weight Range: 86 kg Body mass index is 28 kg/m.   Vital Signs:   Temp:  [98 F (36.7 C)-98.7 F (37.1 C)] 98.1 F (36.7 C) (07/26 1213) Pulse Rate:  [63-109] 82 (07/26 1213) Resp:  [16-21] 16 (07/26 1213) BP: (87-118)/(70-86) 88/70 (07/26 1213) SpO2:  [93 %-98 %] 98 % (07/26 1213) Last BM Date: 07/27/19  Weight change: Filed Weights   07/24/19 1614 07/25/19 0000 07/26/19 0651  Weight: 85.2 kg 84.1 kg 86 kg    Intake/Output:   Intake/Output Summary (Last 24 hours) at 07/27/2019 1359 Last data filed at 07/27/2019 0730 Gross per 24 hour  Intake 440 ml  Output --  Net 440 ml      Physical Exam   MAPS 70-80s   General:  NAD.  HEENT: normal  Neck: supple. JVP not elevated.  Carotids 2+ bilat; no bruits. No lymphadenopathy or thryomegaly appreciated. Cor: LVAD hum.  Lungs: Clear. Abdomen: soft, nontender, non-distended. No hepatosplenomegaly. No bruits or masses. Good bowel sounds. Driveline site clean. Anchor in place.  Extremities: no cyanosis, clubbing, rash. Warm no edema  Neuro: alert & oriented x 3. No focal deficits. Moves all 4 without problem    Telemetry   Not connected    Labs    CBC Recent Labs    07/27/19 0400  WBC 8.6  NEUTROABS 6.3  HGB 10.7*  HCT 34.4*  MCV 95.6  PLT 628   Basic Metabolic Panel Recent Labs    07/25/19 1514 07/26/19 0423 07/27/19 0400  NA 135  --  133*  K 4.4  --  4.1  CL 101  --  101  CO2 25  --  24  GLUCOSE 93  --  99  BUN 16  --  13  CREATININE 1.05  --  0.97   CALCIUM 8.6*  --  8.5*  MG  --  1.8 2.1   Liver Function Tests Recent Labs    07/27/19 0400  AST 67*  ALT 104*  ALKPHOS 117  BILITOT 1.0  PROT 6.0*  ALBUMIN 2.8*   No results for input(s): LIPASE, AMYLASE in the last 72 hours. Cardiac Enzymes No results for input(s): CKTOTAL, CKMB, CKMBINDEX, TROPONINI in the last 72 hours.  BNP: BNP (last 3 results) Recent Labs    07/09/19 0313 07/16/19 0346 07/23/19 0243  BNP 1,815.2* 3,431.7* 1,811.9*    ProBNP (last 3 results) No results for input(s): PROBNP in the last 8760 hours.   D-Dimer No results for input(s): DDIMER in the last 72 hours. Hemoglobin A1C No results for input(s): HGBA1C in the last 72 hours. Fasting Lipid Panel No results for input(s): CHOL, HDL, LDLCALC, TRIG, CHOLHDL, LDLDIRECT in the last 72 hours. Thyroid Function Tests No results for input(s): TSH, T4TOTAL, T3FREE, THYROIDAB in the last 72 hours.  Invalid input(s): FREET3  Other results:   Imaging    No results found.   Medications:     Scheduled Medications: . amiodarone  400 mg Oral BID  . aspirin  81  mg Oral Daily  . atorvastatin  80 mg Oral Daily  . Chlorhexidine Gluconate Cloth  6 each Topical BID  . clonazePAM  0.5 mg Oral BID  . digoxin  0.125 mg Oral Daily  . feeding supplement (ENSURE ENLIVE)  237 mL Oral Daily  . folic acid  1 mg Oral Daily  . gabapentin  300 mg Oral TID  . hydrocortisone cream   Topical TID  . melatonin  5 mg Oral QPC supper  . multivitamin with minerals  1 tablet Oral Daily  . pantoprazole  40 mg Oral Daily  . ranolazine  500 mg Oral BID  . sildenafil  20 mg Oral TID  . thiamine  100 mg Oral Daily  . warfarin  6 mg Oral ONCE-1600  . Warfarin - Pharmacist Dosing Inpatient   Does not apply q1600    Infusions:   PRN Medications: acetaminophen, ALPRAZolam, bisacodyl, calcium carbonate, oxyCODONE, polyvinyl alcohol, sodium chloride flush, sorbitol, traMADol, traZODone    Patient Profile    Moritz Lever [place] --post MI cardiogenic shock/ optimization prior to anticipated CABG, at the request of Dr. Marlou Porch, Cardiology.    Assessment/Plan   1. Acute Anterolateral STEMI/ Multivessel CAD  - S/P HMIII VAD 6/24 - No s/s ischemia  2.  Acute Systolic Heart Failure s/p VAD - EF 20%  - HMIII LVAD  Placed 6/24 - Has had RV failure post VAD - Co-ox improved at 56% yesterday - Continue sildenafil & digoxin - VAD interrogated personally. Parameters stable. - INR 1.5 Discussed dosing with PharmD personally.  3. Refractory VT/VF  - Quiescent. Continue amio - Keep K > 4.0 Mg > 2.0  - K 4.1 mg 2.1  4. Renal mass = RCC - MRI abdomen- 1.8 cm right renal lesion, most consistent with renal cell carcinoma. No evidence of renal vein involvement or nodal metastasis. - Discussed w/ Urology and R RCC is small and will just require surveillance.  Ok to proceed with VAD from their standpoint.   5.  Sleep Apnea - frequent apnea at night  - needs formal sleep study    6. Debility - Appreciate CIR's efforts.   Glori Bickers, MD  07/27/2019 1:59 PM

## 2019-07-28 ENCOUNTER — Encounter (HOSPITAL_COMMUNITY): Admission: AD | Disposition: E | Payer: Self-pay | Source: Ambulatory Visit | Attending: Internal Medicine

## 2019-07-28 ENCOUNTER — Inpatient Hospital Stay (HOSPITAL_COMMUNITY): Payer: BC Managed Care – PPO | Admitting: Anesthesiology

## 2019-07-28 ENCOUNTER — Inpatient Hospital Stay (HOSPITAL_COMMUNITY)
Admission: AD | Admit: 2019-07-28 | Discharge: 2019-09-02 | DRG: 003 | Disposition: E | Payer: BC Managed Care – PPO | Source: Ambulatory Visit | Attending: Internal Medicine | Admitting: Internal Medicine

## 2019-07-28 ENCOUNTER — Inpatient Hospital Stay (HOSPITAL_COMMUNITY): Payer: BC Managed Care – PPO

## 2019-07-28 ENCOUNTER — Inpatient Hospital Stay (HOSPITAL_COMMUNITY): Payer: BC Managed Care – PPO | Admitting: Physical Therapy

## 2019-07-28 ENCOUNTER — Inpatient Hospital Stay (HOSPITAL_COMMUNITY): Payer: BC Managed Care – PPO | Admitting: Certified Registered"

## 2019-07-28 ENCOUNTER — Encounter (HOSPITAL_COMMUNITY): Payer: Self-pay | Admitting: Internal Medicine

## 2019-07-28 DIAGNOSIS — R6339 Other feeding difficulties: Secondary | ICD-10-CM

## 2019-07-28 DIAGNOSIS — C641 Malignant neoplasm of right kidney, except renal pelvis: Secondary | ICD-10-CM | POA: Diagnosis present

## 2019-07-28 DIAGNOSIS — R633 Feeding difficulties: Secondary | ICD-10-CM

## 2019-07-28 DIAGNOSIS — I4901 Ventricular fibrillation: Principal | ICD-10-CM | POA: Diagnosis present

## 2019-07-28 DIAGNOSIS — G9341 Metabolic encephalopathy: Secondary | ICD-10-CM | POA: Diagnosis not present

## 2019-07-28 DIAGNOSIS — Y95 Nosocomial condition: Secondary | ICD-10-CM | POA: Diagnosis not present

## 2019-07-28 DIAGNOSIS — G4730 Sleep apnea, unspecified: Secondary | ICD-10-CM | POA: Diagnosis present

## 2019-07-28 DIAGNOSIS — K921 Melena: Secondary | ICD-10-CM | POA: Diagnosis not present

## 2019-07-28 DIAGNOSIS — E870 Hyperosmolality and hypernatremia: Secondary | ICD-10-CM | POA: Diagnosis present

## 2019-07-28 DIAGNOSIS — Z515 Encounter for palliative care: Secondary | ICD-10-CM | POA: Diagnosis not present

## 2019-07-28 DIAGNOSIS — Z79899 Other long term (current) drug therapy: Secondary | ICD-10-CM

## 2019-07-28 DIAGNOSIS — R61 Generalized hyperhidrosis: Secondary | ICD-10-CM | POA: Diagnosis not present

## 2019-07-28 DIAGNOSIS — R404 Transient alteration of awareness: Secondary | ICD-10-CM | POA: Diagnosis not present

## 2019-07-28 DIAGNOSIS — Z20822 Contact with and (suspected) exposure to covid-19: Secondary | ICD-10-CM | POA: Diagnosis present

## 2019-07-28 DIAGNOSIS — K5900 Constipation, unspecified: Secondary | ICD-10-CM | POA: Diagnosis not present

## 2019-07-28 DIAGNOSIS — I272 Pulmonary hypertension, unspecified: Secondary | ICD-10-CM | POA: Diagnosis present

## 2019-07-28 DIAGNOSIS — E669 Obesity, unspecified: Secondary | ICD-10-CM | POA: Diagnosis present

## 2019-07-28 DIAGNOSIS — I469 Cardiac arrest, cause unspecified: Secondary | ICD-10-CM | POA: Diagnosis not present

## 2019-07-28 DIAGNOSIS — I255 Ischemic cardiomyopathy: Secondary | ICD-10-CM | POA: Diagnosis present

## 2019-07-28 DIAGNOSIS — Z885 Allergy status to narcotic agent status: Secondary | ICD-10-CM

## 2019-07-28 DIAGNOSIS — I420 Dilated cardiomyopathy: Secondary | ICD-10-CM | POA: Diagnosis not present

## 2019-07-28 DIAGNOSIS — I472 Ventricular tachycardia: Secondary | ICD-10-CM | POA: Diagnosis present

## 2019-07-28 DIAGNOSIS — E871 Hypo-osmolality and hyponatremia: Secondary | ICD-10-CM | POA: Diagnosis not present

## 2019-07-28 DIAGNOSIS — Z01818 Encounter for other preprocedural examination: Secondary | ICD-10-CM

## 2019-07-28 DIAGNOSIS — Z978 Presence of other specified devices: Secondary | ICD-10-CM

## 2019-07-28 DIAGNOSIS — Z95811 Presence of heart assist device: Secondary | ICD-10-CM | POA: Diagnosis not present

## 2019-07-28 DIAGNOSIS — J95851 Ventilator associated pneumonia: Secondary | ICD-10-CM | POA: Diagnosis present

## 2019-07-28 DIAGNOSIS — I5023 Acute on chronic systolic (congestive) heart failure: Secondary | ICD-10-CM | POA: Diagnosis present

## 2019-07-28 DIAGNOSIS — Z9889 Other specified postprocedural states: Secondary | ICD-10-CM

## 2019-07-28 DIAGNOSIS — J189 Pneumonia, unspecified organism: Secondary | ICD-10-CM | POA: Diagnosis not present

## 2019-07-28 DIAGNOSIS — I2510 Atherosclerotic heart disease of native coronary artery without angina pectoris: Secondary | ICD-10-CM | POA: Diagnosis present

## 2019-07-28 DIAGNOSIS — D62 Acute posthemorrhagic anemia: Secondary | ICD-10-CM | POA: Diagnosis not present

## 2019-07-28 DIAGNOSIS — D689 Coagulation defect, unspecified: Secondary | ICD-10-CM | POA: Diagnosis present

## 2019-07-28 DIAGNOSIS — I440 Atrioventricular block, first degree: Secondary | ICD-10-CM | POA: Diagnosis not present

## 2019-07-28 DIAGNOSIS — J150 Pneumonia due to Klebsiella pneumoniae: Secondary | ICD-10-CM | POA: Diagnosis not present

## 2019-07-28 DIAGNOSIS — N170 Acute kidney failure with tubular necrosis: Secondary | ICD-10-CM | POA: Diagnosis not present

## 2019-07-28 DIAGNOSIS — N390 Urinary tract infection, site not specified: Secondary | ICD-10-CM | POA: Diagnosis present

## 2019-07-28 DIAGNOSIS — E875 Hyperkalemia: Secondary | ICD-10-CM | POA: Diagnosis present

## 2019-07-28 DIAGNOSIS — Z8249 Family history of ischemic heart disease and other diseases of the circulatory system: Secondary | ICD-10-CM

## 2019-07-28 DIAGNOSIS — R579 Shock, unspecified: Secondary | ICD-10-CM | POA: Diagnosis not present

## 2019-07-28 DIAGNOSIS — R570 Cardiogenic shock: Secondary | ICD-10-CM | POA: Diagnosis not present

## 2019-07-28 DIAGNOSIS — A4159 Other Gram-negative sepsis: Secondary | ICD-10-CM | POA: Diagnosis not present

## 2019-07-28 DIAGNOSIS — R042 Hemoptysis: Secondary | ICD-10-CM | POA: Diagnosis not present

## 2019-07-28 DIAGNOSIS — Z6835 Body mass index (BMI) 35.0-35.9, adult: Secondary | ICD-10-CM

## 2019-07-28 DIAGNOSIS — Z4659 Encounter for fitting and adjustment of other gastrointestinal appliance and device: Secondary | ICD-10-CM

## 2019-07-28 DIAGNOSIS — R339 Retention of urine, unspecified: Secondary | ICD-10-CM | POA: Diagnosis present

## 2019-07-28 DIAGNOSIS — J9601 Acute respiratory failure with hypoxia: Secondary | ICD-10-CM | POA: Diagnosis present

## 2019-07-28 DIAGNOSIS — R6521 Severe sepsis with septic shock: Secondary | ICD-10-CM | POA: Diagnosis not present

## 2019-07-28 DIAGNOSIS — J80 Acute respiratory distress syndrome: Secondary | ICD-10-CM

## 2019-07-28 DIAGNOSIS — Y848 Other medical procedures as the cause of abnormal reaction of the patient, or of later complication, without mention of misadventure at the time of the procedure: Secondary | ICD-10-CM | POA: Diagnosis not present

## 2019-07-28 DIAGNOSIS — R5381 Other malaise: Secondary | ICD-10-CM | POA: Diagnosis present

## 2019-07-28 DIAGNOSIS — Z951 Presence of aortocoronary bypass graft: Secondary | ICD-10-CM

## 2019-07-28 DIAGNOSIS — I2109 ST elevation (STEMI) myocardial infarction involving other coronary artery of anterior wall: Secondary | ICD-10-CM | POA: Diagnosis not present

## 2019-07-28 DIAGNOSIS — I252 Old myocardial infarction: Secondary | ICD-10-CM

## 2019-07-28 DIAGNOSIS — Z430 Encounter for attention to tracheostomy: Secondary | ICD-10-CM

## 2019-07-28 DIAGNOSIS — R509 Fever, unspecified: Secondary | ICD-10-CM | POA: Diagnosis not present

## 2019-07-28 DIAGNOSIS — Z0189 Encounter for other specified special examinations: Secondary | ICD-10-CM

## 2019-07-28 DIAGNOSIS — Z9911 Dependence on respirator [ventilator] status: Secondary | ICD-10-CM | POA: Diagnosis not present

## 2019-07-28 DIAGNOSIS — I5021 Acute systolic (congestive) heart failure: Secondary | ICD-10-CM | POA: Diagnosis not present

## 2019-07-28 DIAGNOSIS — K2970 Gastritis, unspecified, without bleeding: Secondary | ICD-10-CM | POA: Diagnosis present

## 2019-07-28 DIAGNOSIS — E43 Unspecified severe protein-calorie malnutrition: Secondary | ICD-10-CM | POA: Insufficient documentation

## 2019-07-28 DIAGNOSIS — F17200 Nicotine dependence, unspecified, uncomplicated: Secondary | ICD-10-CM | POA: Diagnosis present

## 2019-07-28 DIAGNOSIS — E876 Hypokalemia: Secondary | ICD-10-CM | POA: Diagnosis present

## 2019-07-28 DIAGNOSIS — I110 Hypertensive heart disease with heart failure: Secondary | ICD-10-CM | POA: Diagnosis present

## 2019-07-28 DIAGNOSIS — R34 Anuria and oliguria: Secondary | ICD-10-CM | POA: Diagnosis not present

## 2019-07-28 DIAGNOSIS — I509 Heart failure, unspecified: Secondary | ICD-10-CM

## 2019-07-28 DIAGNOSIS — Z7982 Long term (current) use of aspirin: Secondary | ICD-10-CM

## 2019-07-28 DIAGNOSIS — J9600 Acute respiratory failure, unspecified whether with hypoxia or hypercapnia: Secondary | ICD-10-CM

## 2019-07-28 DIAGNOSIS — F419 Anxiety disorder, unspecified: Secondary | ICD-10-CM | POA: Diagnosis not present

## 2019-07-28 HISTORY — PX: CARDIOVERSION: SHX1299

## 2019-07-28 LAB — BASIC METABOLIC PANEL
Anion gap: 10 (ref 5–15)
Anion gap: 11 (ref 5–15)
BUN: 14 mg/dL (ref 6–20)
BUN: 13 mg/dL (ref 6–20)
CO2: 23 mmol/L (ref 22–32)
CO2: 22 mmol/L (ref 22–32)
Calcium: 8.7 mg/dL — ABNORMAL LOW (ref 8.9–10.3)
Calcium: 8.6 mg/dL — ABNORMAL LOW (ref 8.9–10.3)
Chloride: 101 mmol/L (ref 98–111)
Chloride: 96 mmol/L — ABNORMAL LOW (ref 98–111)
Creatinine, Ser: 1.02 mg/dL (ref 0.61–1.24)
Creatinine, Ser: 1.04 mg/dL (ref 0.61–1.24)
GFR calc Af Amer: >60 mL/min (ref >60)
GFR calc Af Amer: >60 mL/min (ref >60)
GFR calc non Af Amer: >60 mL/min (ref >60)
GFR calc non Af Amer: >60 mL/min (ref >60)
Glucose, Bld: 118 mg/dL — ABNORMAL HIGH (ref 70–99)
Glucose, Bld: 180 mg/dL — ABNORMAL HIGH (ref 70–99)
Potassium: 4.3 mmol/L (ref 3.5–5.1)
Potassium: 4.6 mmol/L (ref 3.5–5.1)
Sodium: 134 mmol/L — ABNORMAL LOW (ref 135–145)
Sodium: 129 mmol/L — ABNORMAL LOW (ref 135–145)

## 2019-07-28 LAB — LACTATE DEHYDROGENASE: LDH: 205 U/L — ABNORMAL HIGH (ref 98–192)

## 2019-07-28 LAB — PROTIME-INR
INR: 1.4 — ABNORMAL HIGH (ref 0.8–1.2)
Prothrombin Time: 16.8 seconds — ABNORMAL HIGH (ref 11.4–15.2)

## 2019-07-28 LAB — MRSA PCR SCREENING: MRSA by PCR: NEGATIVE

## 2019-07-28 LAB — MAGNESIUM: Magnesium: 1.8 mg/dL (ref 1.7–2.4)

## 2019-07-28 LAB — GLUCOSE, CAPILLARY: Glucose-Capillary: 146 mg/dL — ABNORMAL HIGH (ref 70–99)

## 2019-07-28 SURGERY — CARDIOVERSION
Anesthesia: General

## 2019-07-28 MED ORDER — MELATONIN 5 MG PO TABS: 5.0000 mg | ORAL_TABLET | Freq: Every day | ORAL | 0 refills | Status: AC

## 2019-07-28 MED ORDER — ACETAMINOPHEN 325 MG PO TABS: 650.0000 mg | ORAL_TABLET | ORAL | Status: AC | PRN

## 2019-07-28 MED ORDER — PROPOFOL 10 MG/ML IV BOLUS
INTRAVENOUS | Status: DC | PRN
  Administered 2019-07-28: 20 mg via INTRAVENOUS
  Administered 2019-07-28: 30 mg via INTRAVENOUS
  Administered 2019-07-28: 20 mg via INTRAVENOUS
  Administered 2019-07-28: 30 mg via INTRAVENOUS

## 2019-07-28 MED ORDER — CLONAZEPAM 0.5 MG PO TABS: 0.5000 mg | ORAL_TABLET | Freq: Two times a day (BID) | ORAL | 0 refills | Status: AC

## 2019-07-28 MED ORDER — LIDOCAINE HCL (CARDIAC) PF 100 MG/5ML IV SOSY
PREFILLED_SYRINGE | INTRAVENOUS | Status: DC | PRN
  Administered 2019-07-28: 20 mg via INTRATRACHEAL

## 2019-07-28 MED ORDER — PANTOPRAZOLE SODIUM 40 MG PO TBEC: 40.0000 mg | DELAYED_RELEASE_TABLET | Freq: Every day | ORAL | Status: DC

## 2019-07-28 MED ORDER — RANOLAZINE ER 500 MG PO TB12
500.0000 mg | ORAL_TABLET | Freq: Two times a day (BID) | ORAL | Status: DC
  Administered 2019-07-28 – 2019-07-29 (×3): 500 mg via ORAL
  Filled 2019-07-28 (×3): qty 1

## 2019-07-28 MED ORDER — OXYCODONE HCL 5 MG PO TABS
5.0000 mg | ORAL_TABLET | ORAL | Status: DC | PRN
  Administered 2019-07-28: 10 mg via ORAL
  Filled 2019-07-28: qty 2

## 2019-07-28 MED ORDER — ASPIRIN EC 81 MG PO TBEC: 81.0000 mg | DELAYED_RELEASE_TABLET | Freq: Every day | ORAL | Status: DC

## 2019-07-28 MED ORDER — LIDOCAINE HCL (CARDIAC) PF 100 MG/5ML IV SOSY
PREFILLED_SYRINGE | INTRAVENOUS | Status: AC
  Filled 2019-07-28: qty 5

## 2019-07-28 MED ORDER — PROPOFOL 10 MG/ML IV BOLUS
INTRAVENOUS | Status: DC | PRN
  Administered 2019-07-28: 30 mg via INTRAVENOUS
  Administered 2019-07-28: 20 mg via INTRAVENOUS

## 2019-07-28 MED ORDER — CLONAZEPAM 0.5 MG PO TABS
0.5000 mg | ORAL_TABLET | Freq: Two times a day (BID) | ORAL | Status: DC
  Administered 2019-07-28: 0.5 mg via ORAL
  Filled 2019-07-28: qty 1

## 2019-07-28 MED ORDER — ALPRAZOLAM 0.25 MG PO TABS: 0.2500 mg | ORAL_TABLET | Freq: Three times a day (TID) | ORAL | 0 refills | Status: AC | PRN

## 2019-07-28 MED ORDER — WARFARIN SODIUM 3 MG PO TABS: 6.0000 mg | ORAL_TABLET | Freq: Once | ORAL | Status: DC

## 2019-07-28 MED ORDER — MIDAZOLAM HCL 2 MG/2ML IJ SOLN
INTRAMUSCULAR | Status: AC
  Filled 2019-07-28: qty 2

## 2019-07-28 MED ORDER — THIAMINE HCL 100 MG PO TABS: 100.0000 mg | ORAL_TABLET | Freq: Every day | ORAL | Status: DC

## 2019-07-28 MED ORDER — PHENYLEPHRINE HCL (PRESSORS) 10 MG/ML IV SOLN
INTRAVENOUS | Status: DC | PRN
  Administered 2019-07-28 (×4): 80 ug via INTRAVENOUS

## 2019-07-28 MED ORDER — FENTANYL CITRATE (PF) 100 MCG/2ML IJ SOLN
INTRAMUSCULAR | Status: AC
  Administered 2019-07-29: 100 ug
  Filled 2019-07-28: qty 2

## 2019-07-28 MED ORDER — AMIODARONE HCL IN DEXTROSE 360-4.14 MG/200ML-% IV SOLN
INTRAVENOUS | Status: AC
  Filled 2019-07-28: qty 200

## 2019-07-28 MED ORDER — AMIODARONE HCL IN DEXTROSE 360-4.14 MG/200ML-% IV SOLN
60.0000 mg/h | INTRAVENOUS | Status: AC
  Administered 2019-07-28: 60 mg/h via INTRAVENOUS
  Filled 2019-07-28: qty 200

## 2019-07-28 MED ORDER — ACETAMINOPHEN 325 MG PO TABS: 650.0000 mg | ORAL_TABLET | ORAL | Status: DC | PRN

## 2019-07-28 MED ORDER — LIDOCAINE IN D5W 4-5 MG/ML-% IV SOLN
1.0000 mg/min | INTRAVENOUS | Status: DC
  Administered 2019-07-28: 1 mg/min via INTRAVENOUS
  Administered 2019-07-29: 2 mg/min via INTRAVENOUS
  Administered 2019-07-30 – 2019-08-08 (×7): 1 mg/min via INTRAVENOUS
  Filled 2019-07-28 (×10): qty 500

## 2019-07-28 MED ORDER — ADULT MULTIVITAMIN W/MINERALS CH: 1.0000 | ORAL_TABLET | Freq: Every day | ORAL | Status: AC

## 2019-07-28 MED ORDER — WARFARIN SODIUM 3 MG PO TABS
6.0000 mg | ORAL_TABLET | Freq: Once | ORAL | Status: AC
  Administered 2019-07-28: 6 mg via ORAL
  Filled 2019-07-28: qty 2

## 2019-07-28 MED ORDER — TRAMADOL HCL 50 MG PO TABS: 50.0000 mg | ORAL_TABLET | Freq: Four times a day (QID) | ORAL | Status: DC | PRN

## 2019-07-28 MED ORDER — GABAPENTIN 300 MG PO CAPS
300.0000 mg | ORAL_CAPSULE | Freq: Three times a day (TID) | ORAL | Status: DC
  Administered 2019-07-28: 300 mg via ORAL
  Filled 2019-07-28: qty 1

## 2019-07-28 MED ORDER — AMIODARONE HCL IN DEXTROSE 360-4.14 MG/200ML-% IV SOLN
30.0000 mg/h | INTRAVENOUS | Status: DC
  Administered 2019-07-29: 30 mg/h via INTRAVENOUS
  Administered 2019-07-29 (×2): 60 mg/h via INTRAVENOUS
  Administered 2019-07-30 (×2): 30 mg/h via INTRAVENOUS
  Administered 2019-07-31 – 2019-08-03 (×13): 60 mg/h via INTRAVENOUS
  Administered 2019-08-03 – 2019-08-04 (×2): 30 mg/h via INTRAVENOUS
  Filled 2019-07-28 (×12): qty 200
  Filled 2019-07-28: qty 400
  Filled 2019-07-28 (×10): qty 200

## 2019-07-28 MED ORDER — TRAMADOL HCL 50 MG PO TABS: 50.0000 mg | ORAL_TABLET | ORAL | Status: AC | PRN

## 2019-07-28 MED ORDER — LIDOCAINE BOLUS VIA INFUSION
50.0000 mg | Freq: Once | INTRAVENOUS | Status: AC
  Administered 2019-07-28: 50 mg via INTRAVENOUS
  Filled 2019-07-28: qty 52

## 2019-07-28 MED ORDER — MELATONIN 5 MG PO TABS: 5.0000 mg | ORAL_TABLET | Freq: Every day | ORAL | Status: DC

## 2019-07-28 MED ORDER — MIDAZOLAM HCL 2 MG/2ML IJ SOLN
INTRAMUSCULAR | Status: AC
  Administered 2019-07-29: 4 mg
  Filled 2019-07-28: qty 6

## 2019-07-28 MED ORDER — AMIODARONE HCL 200 MG PO TABS: 400.0000 mg | ORAL_TABLET | Freq: Two times a day (BID) | ORAL | Status: DC

## 2019-07-28 MED ORDER — LIDOCAINE 2% (20 MG/ML) 5 ML SYRINGE
INTRAMUSCULAR | Status: DC | PRN
  Administered 2019-07-28: 100 mg via INTRAVENOUS

## 2019-07-28 MED ORDER — POLYVINYL ALCOHOL 1.4 % OP SOLN: 1.0000 [drp] | OPHTHALMIC | 0 refills | Status: AC | PRN

## 2019-07-28 MED ORDER — OXYCODONE HCL 5 MG PO TABS: 5.0000 mg | ORAL_TABLET | ORAL | 0 refills | Status: AC | PRN

## 2019-07-28 MED ORDER — FENTANYL CITRATE (PF) 100 MCG/2ML IJ SOLN
INTRAMUSCULAR | Status: AC
  Filled 2019-07-28: qty 2

## 2019-07-28 MED ORDER — WARFARIN SODIUM 6 MG PO TABS: 6.0000 mg | ORAL_TABLET | Freq: Once | ORAL | Status: AC

## 2019-07-28 MED ORDER — DIGOXIN 125 MCG PO TABS: 0.1250 mg | ORAL_TABLET | Freq: Every day | ORAL | Status: DC

## 2019-07-28 MED ORDER — TRAZODONE HCL 100 MG PO TABS: 100.0000 mg | ORAL_TABLET | Freq: Every evening | ORAL | Status: AC | PRN

## 2019-07-28 MED ORDER — MEXILETINE HCL 200 MG PO CAPS
200.0000 mg | ORAL_CAPSULE | Freq: Two times a day (BID) | ORAL | Status: DC
  Administered 2019-07-28: 200 mg via ORAL
  Filled 2019-07-28 (×2): qty 1

## 2019-07-28 MED ORDER — SODIUM CHLORIDE 0.9 % IV SOLN: INTRAVENOUS | Status: DC | PRN

## 2019-07-28 MED ORDER — PHENYLEPHRINE 40 MCG/ML (10ML) SYRINGE FOR IV PUSH (FOR BLOOD PRESSURE SUPPORT)
PREFILLED_SYRINGE | INTRAVENOUS | Status: DC | PRN
  Administered 2019-07-28: 120 ug via INTRAVENOUS
  Administered 2019-07-28: 80 ug via INTRAVENOUS
  Administered 2019-07-28: 120 ug via INTRAVENOUS
  Administered 2019-07-28: 80 ug via INTRAVENOUS

## 2019-07-28 MED ORDER — ADULT MULTIVITAMIN W/MINERALS CH: 1.0000 | ORAL_TABLET | Freq: Every day | ORAL | Status: DC

## 2019-07-28 MED ORDER — CHLORHEXIDINE GLUCONATE CLOTH 2 % EX PADS
6.0000 | MEDICATED_PAD | Freq: Every day | CUTANEOUS | Status: DC
  Administered 2019-07-28 – 2019-07-31 (×6): 6 via TOPICAL

## 2019-07-28 MED ORDER — WARFARIN - PHARMACIST DOSING INPATIENT: Freq: Every day | Status: DC

## 2019-07-28 MED ORDER — FOLIC ACID 1 MG PO TABS: 1.0000 mg | ORAL_TABLET | Freq: Every day | ORAL | Status: DC

## 2019-07-28 MED ORDER — ATORVASTATIN CALCIUM 80 MG PO TABS: 80.0000 mg | ORAL_TABLET | Freq: Every day | ORAL | Status: DC

## 2019-07-28 MED ORDER — SILDENAFIL CITRATE 20 MG PO TABS
20.0000 mg | ORAL_TABLET | Freq: Three times a day (TID) | ORAL | Status: DC
  Administered 2019-07-28: 20 mg via ORAL
  Filled 2019-07-28: qty 1

## 2019-07-28 MED ORDER — MAGNESIUM SULFATE 2 GM/50ML IV SOLN
2.0000 g | Freq: Once | INTRAVENOUS | Status: AC
  Administered 2019-07-28: 2 g via INTRAVENOUS
  Filled 2019-07-28: qty 50

## 2019-07-28 NOTE — Progress Notes (Signed)
Occupational Therapy Session Note  Patient Details  Name: Aaron [name] MRN: [id number] Date of Birth: [birth year]  Today's Date: 07/19/2019 OT Individual Time: 0700-0810 OT Individual Time Calculation (min): 70 min    Short Term Goals: Week 1:  OT Short Term Goal 1 (Week 1): STGs=LTGs due to ELOS  Skilled Therapeutic Interventions/Progress Updates:    Pt sitting EOB eating breakfast upon arrival.  Pt requested to use toilet and amb without AD to bathroom-CGA. Pt completed toileting tasks with supervision.  Pt completed washing while standing at sink and changed clothing with sit<>stand from EOB.  Pt switches to battery pack independently. Pt amb with RW to Day Room and completed 5 mins on NuStep to increase BLE strength and endurance-level 2 BLE only. Pt reported slight dizziness at end of 5 mins. Pt amb with RW in hallway to gym and returned with extended rest break. Discussed energy conservation strategies and home safety.  Pt returned to room and switched to power source independently.  Pt remained in bed with all needs within reach and bed alarm activated.   Therapy Documentation Precautions:  Restrictions Weight Bearing Restrictions: No  Sternal precautions Pain:  Pt denies pain this morning  Therapy/Group: Individual Therapy  Leroy Libman 07/23/2019, 8:11 AM

## 2019-07-28 NOTE — CV Procedure (Signed)
    DIRECT CURRENT CARDIOVERSION  NAME:  Aaron [name]   MRN: [id number] DOB:  03/23/1965   ADMIT DATE: 07/20/2019   INDICATIONS: Ventricular fibrillation    PROCEDURE:   Emergent consent. Once an appropriate time out was taken, the patient had the defibrillator pads placed in the anterior and posterior position. The patient then underwent sedation by the anesthesia service. Once an appropriate level of sedation was achieved, the patient received a single biphasic, synchronized 200J shock with prompt conversion to sinus rhythm. No apparent complications.  Glori Bickers, MD  10:38 PM

## 2019-07-28 NOTE — Progress Notes (Signed)
Was called to bedside for increased dyspnea and increased work of breathing w/ new supp O2 requirements. No CP. Feels dizzy.   A&Ox3, dyspneic and diaphoretic. Looks apprehensive. JVD elevated to ear but lung fields CTAB.   Low PIs observed on VAD interrogation, lowest 1.8  STAT EKG obtained showing VF rate 210 bpm. Doppler MAP stable 92.   Transfer to [place] and prep for DCCV.   CIR attending notified.

## 2019-07-28 NOTE — Plan of Care (Signed)

## 2019-07-28 NOTE — Anesthesia Postprocedure Evaluation (Signed)
Anesthesia Post Note  Patient: Aaron [name]  Procedure(s) Performed: CARDIOVERSION (N/A )     Patient location during evaluation: PACU Anesthesia Type: General Level of consciousness: awake and alert Pain management: pain level controlled Vital Signs Assessment: post-procedure vital signs reviewed and stable Respiratory status: spontaneous breathing, nonlabored ventilation, respiratory function stable and patient connected to nasal cannula oxygen Cardiovascular status: blood pressure returned to baseline and stable Postop Assessment: no apparent nausea or vomiting Anesthetic complications: no   No complications documented.  Last Vitals:  Vitals:   07/04/2019 2305 07/08/2019 2310  BP: (!) 84/47   Pulse: 53 (!) 28  Resp: (!) 26 (!) 24  Temp:    SpO2:      Last Pain:  Vitals:   07/05/2019 2100  TempSrc: Oral  PainSc:                  Tiajuana Amass

## 2019-07-28 NOTE — Progress Notes (Signed)
Patient ID: Aaron [name], male   DOB: Apr 13, 1965, 54 y.o.   MRN: [id number]     Advanced Heart Failure Rounding Note  PCP-Cardiologist: No primary care provider on file.   Subjective:    Progressing well w/ CIR. Had am session w/ PT this morning. Felt mildly SOB. No other complaints.   Has been trying to eat more. Continues w/ occasional dizziness. Wt stable at 189 lb. MAPs 70s-80s.  INR 1.4 LDH stable 205    LVAD Interrogation HM III: Speed: 5200 Flow: 3.6  PI: 2.8 Power:  3.5.  Had 2 low PIs earlier today at 1.9. VAD interrogated personally. Parameters stable.   Objective:   Weight Range: 86 kg Body mass index is 28 kg/m.   Vital Signs:   Temp:  [98.1 F (36.7 C)-98.7 F (37.1 C)] 98.2 F (36.8 C) (07/27 0429) Pulse Rate:  [51-82] 79 (07/27 0853) Resp:  [16-18] 18 (07/27 0853) BP: (85-98)/(52-74) 91/65 (07/27 0853) SpO2:  [94 %-98 %] 97 % (07/27 0853) Weight:  [86 kg] 86 kg (07/27 0436) Last BM Date: 07/27/19  Weight change: Filed Weights   07/25/19 0000 07/26/19 0651 07/03/2019 0436  Weight: 84.1 kg 86 kg 86 kg    Intake/Output:   Intake/Output Summary (Last 24 hours) at 07/19/2019 1009 Last data filed at 07/27/2019 1300 Gross per 24 hour  Intake 444 ml  Output --  Net 444 ml      Physical Exam    General:  Well appearing, laying in bed. No distress.   HEENT: normal  Neck: supple. JVP elevated to jawline.  Carotids 2+ bilat; no bruits. No lymphadenopathy or thryomegaly appreciated. Cor: + LVAD hum.  Lungs: Clear. No wheezing  Abdomen: soft, nontender, non-distended. No hepatosplenomegaly. No bruits or masses. Good bowel sounds. Driveline site clean. Anchor in place.  Extremities: no cyanosis, clubbing, rash. Warm. No LE Neuro: alert & oriented x 3. No focal deficits. Moves all 4 without problem    Telemetry   N/A.   Labs    CBC Recent Labs    07/27/19 0400  WBC 8.6  NEUTROABS 6.3  HGB 10.7*  HCT 34.4*  MCV 95.6  PLT 423   Basic  Metabolic Panel Recent Labs    07/25/19 1514 07/26/19 0423 07/27/19 0400 07/10/2019 0405  NA   < >  --  133* 134*  K   < >  --  4.1 4.3  CL   < >  --  101 101  CO2   < >  --  24 23  GLUCOSE   < >  --  99 118*  BUN   < >  --  13 14  CREATININE   < >  --  0.97 1.02  CALCIUM   < >  --  8.5* 8.7*  MG  --  1.8 2.1  --    < > = values in this interval not displayed.   Liver Function Tests Recent Labs    07/27/19 0400  AST 67*  ALT 104*  ALKPHOS 117  BILITOT 1.0  PROT 6.0*  ALBUMIN 2.8*   No results for input(s): LIPASE, AMYLASE in the last 72 hours. Cardiac Enzymes No results for input(s): CKTOTAL, CKMB, CKMBINDEX, TROPONINI in the last 72 hours.  BNP: BNP (last 3 results) Recent Labs    07/09/19 0313 07/16/19 0346 07/23/19 0243  BNP 1,815.2* 3,431.7* 1,811.9*    ProBNP (last 3 results) No results for input(s): PROBNP in the last 8760 hours.  D-Dimer No results for input(s): DDIMER in the last 72 hours. Hemoglobin A1C No results for input(s): HGBA1C in the last 72 hours. Fasting Lipid Panel No results for input(s): CHOL, HDL, LDLCALC, TRIG, CHOLHDL, LDLDIRECT in the last 72 hours. Thyroid Function Tests No results for input(s): TSH, T4TOTAL, T3FREE, THYROIDAB in the last 72 hours.  Invalid input(s): FREET3  Other results:   Imaging    No results found.   Medications:     Scheduled Medications:  amiodarone  400 mg Oral BID   aspirin  81 mg Oral Daily   atorvastatin  80 mg Oral Daily   Chlorhexidine Gluconate Cloth  6 each Topical BID   clonazePAM  0.5 mg Oral BID   digoxin  0.125 mg Oral Daily   feeding supplement (ENSURE ENLIVE)  237 mL Oral Daily   folic acid  1 mg Oral Daily   gabapentin  300 mg Oral TID   hydrocortisone cream   Topical TID   melatonin  5 mg Oral QPC supper   multivitamin with minerals  1 tablet Oral Daily   pantoprazole  40 mg Oral Daily   ranolazine  500 mg Oral BID   sildenafil  20 mg Oral TID    thiamine  100 mg Oral Daily   Warfarin - Pharmacist Dosing Inpatient   Does not apply q1600    Infusions:   PRN Medications: acetaminophen, ALPRAZolam, bisacodyl, calcium carbonate, oxyCODONE, polyvinyl alcohol, sodium chloride flush, sorbitol, traMADol, traZODone    Patient Profile   Aaron [name] --post MI cardiogenic shock/ optimization prior to anticipated CABG, at the request of Dr. Marlou Porch, Cardiology.    Assessment/Plan   1. Acute Anterolateral STEMI/ Multivessel CAD  - S/P HMIII VAD 6/24 - No s/s ischemia  2.  Acute Systolic Heart Failure s/p VAD - EF 20%  - HMIII LVAD  Placed 6/24 - Has had RV failure post VAD - Co-ox improved at 56% on 7/25 - Continue sildenafil & digoxin - VAD interrogated personally. Parameters stable. - INR 1.4. LDH ok.  Discussed dosing with PharmD personally.  3. Refractory VT/VF  - Quiescent. Continue amio - Keep K > 4.0 Mg > 2.0  - K 4.3,  Mg 2.1  4. Renal mass = RCC - MRI abdomen- 1.8 cm right renal lesion, most consistent with renal cell carcinoma. No evidence of renal vein involvement or nodal metastasis. - Discussed w/ Urology and R RCC is small and will just require surveillance.  Ok to proceed with VAD from their standpoint.   5.  Sleep Apnea - frequent apnea at night  - needs formal sleep study    6. Debility - Appreciate CIR's efforts.   Glori Bickers, MD  07/21/2019 10:09 AM   Patient seen and examined with the above-signed Advanced Practice Provider and/or Housestaff. I personally reviewed laboratory data, imaging studies and relevant notes. I independently examined the patient and formulated the important aspects of the plan. I have edited the note to reflect any of my changes or salient points. I have personally discussed the plan with the patient and/or family.  Continues to work with rehab. Progressing but stil struggling with RV failure. PI low but no PI events. CVP to jaw but still with some orthostatic  symptoms. No co-ox today  General:  NAD.  HEENT: normal  Neck: supple. JVP to jaw  Carotids 2+ bilat; no bruits. No lymphadenopathy or thryomegaly appreciated. Cor: LVAD hum.  Lungs: Clear. Abdomen: soft, nontender, non-distended. No hepatosplenomegaly. No bruits  or masses. Good bowel sounds. Driveline site clean. Anchor in place.  Extremities: no cyanosis, clubbing, rash. Warm tr edema  Neuro: alert & oriented x 3. No focal deficits. Moves all 4 without problem   Improving but still with some RV failure. Will need to continue to follow closely. Continue digoxin and sildenafil. Would not diurese at this time unless more SOB or weight up. VAD interrogated personally. Parameters stable.  Glori Bickers, MD  10:11 AM

## 2019-07-28 NOTE — Progress Notes (Signed)
[place] for warfarin Indication: LVAD  Allergies  Allergen Reactions  . Codeine Hives    Patient Measurements: Height: 5\' 9"  (175.3 cm) Weight: 86 kg (189 lb 9.5 oz) IBW/kg (Calculated) : 70.7 Heparin Dosing Weight: ~ 92 kg  Vital Signs: Temp: 98.2 F (36.8 C) (07/27 0429) Temp Source: Oral (07/27 0429) BP: 91/65 (07/27 0853) Pulse Rate: 79 (07/27 0853)  Labs: Recent Labs    07/25/19 1514 07/25/19 1514 07/26/19 0423 07/27/19 0400 07/31/2019 0405  HGB  --   --   --  10.7*  --   HCT  --   --   --  34.4*  --   PLT  --   --   --  215  --   LABPROT 26.1*   < > 22.0* 17.8* 16.8*  INR 2.5*   < > 2.0* 1.5* 1.4*  CREATININE 1.05  --   --  0.97 1.02   < > = values in this interval not displayed.    Estimated Creatinine Clearance: 88.9 mL/min (by C-G formula based on SCr of 1.02 mg/dL).  Assessment: 54 yo male s/p LVAD HM3 placement 6/24, followed by IABP 7/8-7/13.  Pharmacy asked to dose anticoagulation with warfarin.   INR down to 1.4 despite boosted dose on 7/26, will give boosted dose tonight again and defer IV heparin one more day and assess if INR is trending up. LDH stable, no PI events.   Goal of Therapy:  INR 2-2.5  Monitor platelets by anticoagulation protocol: Yes   Plan:  -Warfarin 6mg  PO x1 again tonight -Daily INR -Will start low, fixed dose heparin 7/28 if INR is not trending up   Arrie Senate, PharmD, BCPS Clinical Pharmacist [phone] Please check AMION for all [place] numbers 07/22/2019

## 2019-07-28 NOTE — Progress Notes (Signed)
Patient ID: Aaron [name], male   DOB: 24-Jun-1965, 54 y.o.   MRN: [id number]  SW met with pt in room to provide updates from team conference, and d/c date 8/5. SW called pt sister Aaron [name] [phone]) to provide updates as well. She confirms that she intends to move in with him for a few months to provide assistance. SW informed there will continue to be updates to discuss care needs.   Loralee Pacas, MSW, [place] Office: [phone] Cell: [phone] Fax: [phone]

## 2019-07-28 NOTE — Progress Notes (Signed)
Patient Details  Name: Aaron [name] MRN: [id number] Date of Birth: [birth year]  Today's Date: 07/19/2019  Hospital Problems: Principal Problem:   Debility Active Problems:   Hyponatremia   Drug-induced hypotension   Chronic anticoagulation   Ventricular fibrillation [facility])  Past Medical History:  Past Medical History:  Diagnosis Date  . Acute anterolateral wall MI ([place])   . CAD, multiple vessel   . Hypertension   . Ischemic cardiomyopathy   . Systolic heart failure ([place])   . Tobacco abuse    Past Surgical History:  Past Surgical History:  Procedure Laterality Date  . CARDIOVERSION N/A 07/02/2019   Procedure: CARDIOVERSION;  Surgeon: Jolaine Artist, MD;  Location: [facility] OR;  Service: Cardiovascular;  Laterality: N/A;  . CORONARY/GRAFT ACUTE MI REVASCULARIZATION N/A 06/17/2019   Procedure: Coronary/Graft Acute MI Revascularization;  Surgeon: Yolonda Kida, MD;  Location: [place] CV LAB;  Service: Cardiovascular;  Laterality: N/A;  . IABP INSERTION N/A 06/17/2019   Procedure: IABP Insertion;  Surgeon: Yolonda Kida, MD;  Location: [place] CV LAB;  Service: Cardiovascular;  Laterality: N/A;  . IABP INSERTION N/A 06/24/2019   Procedure: IABP INSERTION;  Surgeon: Jolaine Artist, MD;  Location: [place] CV LAB;  Service: Cardiovascular;  Laterality: N/A;  . IABP INSERTION N/A 07/09/2019   Procedure: IABP Insertion;  Surgeon: Jolaine Artist, MD;  Location: [place] CV LAB;  Service: Cardiovascular;  Laterality: N/A;  . INSERTION OF IMPLANTABLE LEFT VENTRICULAR ASSIST DEVICE N/A 06/25/2019   Procedure: INSERTION OF IMPLANTABLE LEFT VENTRICULAR ASSIST DEVICE USING THORATEC HEARTMATE 3;  Surgeon: Wonda Olds, MD;  Location: [place];  Service: Open Heart Surgery;  Laterality: N/A;  . LEFT HEART CATH AND CORONARY ANGIOGRAPHY N/A 06/17/2019   Procedure: LEFT HEART CATH AND CORONARY ANGIOGRAPHY;  Surgeon: Yolonda Kida, MD;  Location: [place]  CV LAB;  Service: Cardiovascular;  Laterality: N/A;  . RIGHT HEART CATH N/A 06/17/2019   Procedure: RIGHT HEART CATH;  Surgeon: Yolonda Kida, MD;  Location: [place] CV LAB;  Service: Cardiovascular;  Laterality: N/A;  . RIGHT HEART CATH N/A 06/24/2019   Procedure: RIGHT HEART CATH;  Surgeon: Jolaine Artist, MD;  Location: [place] CV LAB;  Service: Cardiovascular;  Laterality: N/A;  . RIGHT HEART CATH N/A 07/09/2019   Procedure: RIGHT HEART CATH;  Surgeon: Jolaine Artist, MD;  Location: [place] CV LAB;  Service: Cardiovascular;  Laterality: N/A;  . TEE WITHOUT CARDIOVERSION N/A 06/25/2019   Procedure: TRANSESOPHAGEAL ECHOCARDIOGRAM (TEE);  Surgeon: Wonda Olds, MD;  Location: [place];  Service: Open Heart Surgery;  Laterality: N/A;   Social History:  reports that he has been smoking. He has a 45.00 pack-year smoking history. He has never used smokeless tobacco. He reports current alcohol use of about 7.0 - 14.0 standard drinks of alcohol per week. He reports that he does not use drugs.  Family / Support Systems Marital Status: Divorced How Long?: 3 years Patient Roles: Parent Spouse/Significant Other: Divorced from Aaron [name] [phone]) Children: one adult son Other Supports: sister Aaron [name] Anticipated Caregiver: Aaron [name] Ability/Limitations of Caregiver: None reported Caregiver Availability: 24/7 Family Dynamics: Pt lives alone and works full time.  Social History Preferred language: English Religion: None Cultural Background: Pt currently works in blue collar role- Education officer, museum Education: high school grad Read: Yes Write: Yes Employment Status: Employed Length of Employment: 15 Return to Work Plans: Pt intends to return when able. Pt sister states that she has  submitted STD for patient to his employer. Legal History/Current Legal Issues: incident at age 64 y.o. Guardian/Conservator: N/A   Abuse/Neglect Abuse/Neglect Assessment Can Be Completed:  Yes Physical Abuse: Denies Verbal Abuse: Denies Sexual Abuse: Denies Exploitation of patient/patient's resources: Denies Self-Neglect: Denies  Emotional Status Pt's affect, behavior and adjustment status: Pt in good spirits at time of visit. Pt complained of S.O.B. Recent Psychosocial Issues: Denies Psychiatric History: Denies Substance Abuse History: Amits he quit smoking cigarettes at time of admission. Was smoking 1pm-1.5pk per day. Admits to [facility] use  in which he drank several mixed drinks per night. He states he has quit at the time of admission. Also occassional marijuana use.  Patient / Family Perceptions, Expectations & Goals Pt/Family understanding of illness & functional limitations: Pt and pt sister have general understanding of care needs Premorbid pt/family roles/activities: Independent Anticipated changes in roles/activities/participation: Assistance with ADLs/IADLs  Community Resources Express Scripts: None Premorbid Home Care/DME Agencies: None Transportation available at discharge: sister Nature conservation officer  Discharge Planning Living Arrangements: Alone Support Systems: Other relatives Type of Residence: Private residence Insurance Resources: Multimedia programmer (specify) Nurse, mental health) Financial Resources: Employment Financial Screen Referred: No Living Expenses: Own Money Management: Patient Does the patient have any problems obtaining your medications?: No Care Coordinator Anticipated Follow Up Needs: HH/OP  Clinical Impression SW met with pt in room to introduce self, explain role, and discuss discharge process. Pt has no DME. 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 No DME. No HCPOA.   Aaron [name] 07/19/2019, 4:19 PM

## 2019-07-28 NOTE — Patient Care Conference (Signed)
Inpatient RehabilitationTeam Conference and Plan of Care Update Date: 07/20/2019   Time: 10:59 AM    Patient Name: Aaron [name]      Medical Record Number: [id number]  Date of Birth: December 14, 1965 Sex: Male         Room/Bed: 4W19C/4W19C-01 Payor Info: Payor: [place] / Plan: BCBS COMM PPO / Product Type: *No Product type* /    Admit Date/Time:  07/24/2019  3:10 PM  Primary Diagnosis:  [place] Hospital Problems: Principal Problem:   Debility Active Problems:   Hyponatremia   Drug-induced hypotension   Chronic anticoagulation    Expected Discharge Date: Expected Discharge Date: 08/06/19  Team Members Present: Physician leading conference: Dr. [name] Care Coodinator Present: Loralee Pacas, LCSWA;Gabreil [place] Creig Hines, RN, BSN, CRRN Nurse Present: Dwaine Gale, RN PT Present: Barrie Folk, PT OT Present: Willeen Cass, OT;Roanna Epley, COTA SLP Present: Jettie Booze, CF-SLP PPS Coordinator present : Gunnar Fusi, Novella Olive, PT     Current Status/Progress Goal Weekly Team Focus  Bowel/Bladder   Patient is continent of bladder and bowel. Last BM 7/26  Remain continent  Monitor for changes in bladder and bowel.   Swallow/Nutrition/ Hydration             ADL's   supervision overall; easily fatigues; LVAD management with supervision  mod I overall  activity tolerance, balance, LVAD management, safety awareness   Mobility   Supervision to CGA overall with RW, gait Supervision with RW or CGA no AD, min A stairs  mod I overall with LRAD  endurance, balance, gait training, LVAD management   Communication             Safety/Cognition/ Behavioral Observations            Pain   Pain to incision area on the chest. 6/10  Pain equal to or less than 2.  Monitor for pain and medicate prn.   Skin   Skin is clean dry and intact. Incision to chest healing/OTA. Catheter dressing  from LVAD is clean dry and intact.  No skin breakdown.  Monitor skin q shift and  prn.     Team Discussion:  Discharge Planning/Teaching Needs:    Sister is primary support, she works full-time.   Family education as recommended by therapy.    Current Update:  None  Current Barriers to Discharge:  Decreased caregiver support, Lack of/limited family support and SOB.  Possible Resolutions to Barriers: Going home with sister however, she works full-time, continue to work on Systems developer.  Patient on target to meet rehab goals: yes, Continent B/B, c/o incisional pain, has medication regimen. C/O dizziness and SOB when ambulating. PT and  OT to allow for more rest breaks to build up endurance.  *See Care Plan and progress notes for long and short-term goals.   Revisions to Treatment Plan:  none    Medical Summary Current Status: continent- LBM yesterday- little SOB- O2 given; IV heparin if INR doesn't improve by tomorrow- is dizzy occ- white stars- BP soft Weekly Focus/Goal: INR/IV heparin???; BP soft  Barriers to Discharge: Decreased family/caregiver support;Home enviroment access/layout;Wound care;Weight bearing restrictions;Medical stability;Other (comments);New oxygen  Barriers to Discharge Comments: LVAD- new- possible IV heparin; d/c- 8/5- Possible Resolutions to Barriers: learning how to manage LVAD- work on fatigue   Continued Need for Acute Rehabilitation Level of Care: The patient requires daily medical management by a physician with specialized training in physical medicine and rehabilitation for the following reasons: Direction of  a multidisciplinary physical rehabilitation program to maximize functional independence : Yes Medical management of patient stability for increased activity during participation in an intensive rehabilitation regime.: Yes Analysis of laboratory values and/or radiology reports with any subsequent need for medication adjustment and/or medical intervention. : Yes   I attest that I was present, lead the team conference,  and concur with the assessment and plan of the team.   Cristi Loron 07/06/2019, 2:44 PM

## 2019-07-28 NOTE — Progress Notes (Signed)
LVAD Coordinator Rounding Note:  Transferred from [facility] ED on 06/17/19 with STEMI. Cath revealed multi-vessel CAD, wire would not cross to open LAD; IABP placed and pt transferred here for evaluation for CABG.   Dr. Orvan Seen evaluated and found he was not amenable to surgical or percutaneous revascularization due to poor targets. With severe depressed LV function and little coronary perfusion, LVAD was recommended.   HM III LVAD implanted on 06/25/19 by Dr. Orvan Seen under Destination Therapy criteria due to recent smoking history.  Developed sepsis/PNA with respiratory failure. Had refractory VT/VF. IABP/swan placed 07/09/19 - discontinued 07/14/19  Pt's transferred to CIR on 07/24/19. Pt lying in bed. Just finished working with PT, slightly SOB and having pain in his chest incision. RN in the room giving pain medication.   Vital signs: Temp: 98.2 HR: 79   Auto BP:  91/65 (74) Doppler:  76 O2 Sat: 97% on RA Wt:207>225.7>222.6>214.3.....193.7>187.3>184.3>184.3>184.3>183.4>189.6 lbs  LVAD parameters: Speed:  5200 Flow: 3.7 Power:  3.5w PI: 2.0 Hct: 34  Alarms: none Events: none   Fixed speed: 5200 Low speed limit: 4900   Drive Line:  Existing VAD dressing removed and site care performed using sterile technique. Drive line exit site cleaned with betadine swabs x 2 and rinsed with sterile saline, allowed to dry, Cellurate appiled to wound bed, and gauze dressing WITHOUT calcium aliginate or silver strip re-applied. No skin prep used to decrease chance of skin irritation.Exit site unincorporated, erythema around exit site improving. Small amount yellow drainage with no foul odor noted.  The velour is fully implanted at exit site. Anchor secure.  Every other day dressing changes per Coordinator, Nurse Davonna Belling, or trained caregiver. Next dressing change due: 07/30/19.    Labs:  LDH trend: 660>398........375>338>320>317>257>257>244>256>233>194>205  INR trend:  1.4>1.3.....1.5>1.3>1.3>1.4>1.8>2.5>3.2>1.5>1.4  Anticoagulation Plan: -INR Goal:  2.0 - 2.5 -ASA Dose: 81 mg - will keep going for CAD for 3 mths stop date 09/24/25  Blood Products:  -Intra-op - 06/25/19>>6 units FFP  - 07/09/19>>2 FFP - 07/10/19>>2 FFP  Device: N/A  Drips: None  Infection: - 07/09/19 - BCs>>negative - 07/09/19 - resp culture>>negative - 07/21/19 - DL wound culture>>negative  Arrythmias: was on Amiodarone pre implant for NSVT - 2 episodes of VF requiring emergent DC-CV on 07/02/19 - Recurrent VF on 7/2 and broke spontaneously.  - 7/3 Recurrent VF at 2a.-> emergent DC-CV.  - 07/07/2019 VT/Torsades -> repeat DC_CV - 07/08/2019 VF/Torsades x2--> DC-CV x2.  - 07/09/19 Multiple episodes of VT/VF -> IABP placed. AV pacing via external pacing wires - underlying rhythm bradycardia 40 bpm; external AV pacing wires remain intact  Respiratory: extubated 06/26/19 - re-intubated 07/09/19 for sepsis/pneumoni - extubated 07/14/19  Nitric Oxide: off 06/26/19  - Restarted 07/10/19 - Off 07/14/19  VAD Education: 1. Sister-Joanne is checked off to perform driveline dressing changes independently.  2. Discharge education completed 07/21/19 with patient, [name], and Otila Kluver.    Plan/Recommendations:  1. Call VAD pager if any VAD equipment or drive line issues. 2. Every other day dressing changes per VAD Coordinator, Nurse Davonna Belling, or pts sister.  Tanda Rockers RN [place] Coordinator  Office: [phone]  24/7 Pager: [phone]

## 2019-07-28 NOTE — Anesthesia Postprocedure Evaluation (Signed)
Anesthesia Post Note  Patient: Aaron [name]  Procedure(s) Performed: AN AD [place]     Patient location during evaluation: ICU Anesthesia Type: General Level of consciousness: awake and alert Pain management: pain level controlled Vital Signs Assessment: post-procedure vital signs reviewed and stable Respiratory status: spontaneous breathing, nonlabored ventilation, respiratory function stable and patient connected to nasal cannula oxygen Cardiovascular status: blood pressure returned to baseline and stable Postop Assessment: no apparent nausea or vomiting Anesthetic complications: no   No complications documented.  Last Vitals: There were no vitals filed for this visit.  Last Pain:  Vitals:   07/03/2019 1530  PainSc: 3                  Catalina Gravel

## 2019-07-28 NOTE — Care Management (Signed)
Inpatient Rehabilitation Center Individual Statement of Services  Patient Name:  Aaron [name]  Date:  07/09/2019  Welcome to the [facility].  Our goal is to provide you with an individualized program based on your diagnosis and situation, designed to meet your specific needs.  With this comprehensive rehabilitation program, you will be expected to participate in at least 3 hours of rehabilitation therapies Monday-Friday, with modified therapy programming on the weekends.  Your rehabilitation program will include the following services:  Physical Therapy (PT), Occupational Therapy (OT), 24 hour per day rehabilitation nursing, Therapeutic Recreaction (TR), Psychology, Neuropsychology, Care Coordinator, Rehabilitation Medicine, Nutrition Services, Pharmacy Services and Other  Weekly team conferences will be held on Tuesdays to discuss your progress.  Your Inpatient Rehabilitation Care Coordinator will talk with you frequently to get your input and to update you on team discussions.  Team conferences with you and your family in attendance may also be held.  Expected length of stay: 7-10 days   Overall anticipated outcome: Independent with an Assistive Device  Depending on your progress and recovery, your program may change. Your Inpatient Rehabilitation Care Coordinator will coordinate services and will keep you informed of any changes. Your Inpatient Rehabilitation Care Coordinator's name and contact numbers are listed  below.  The following services may also be recommended but are not provided by the [facility] will be made to provide these services after discharge if needed.  Arrangements include referral to agencies that provide these services.  Your insurance has been verified to be:  [place]  Your primary  doctor is:  No PCP  Pertinent information will be shared with your doctor and your insurance company.  Inpatient Rehabilitation Care Coordinator:  Cathleen Corti [phone] or ([phone]  Information discussed with and copy given to patient by: Rana Snare, 07/06/2019, 11:07 AM

## 2019-07-28 NOTE — Progress Notes (Signed)
[place] PHYSICAL MEDICINE & REHABILITATION PROGRESS NOTE  Subjective/Complaints:  Pt reports just finished PT- feeling pretty SOB- feels like needs some O2- doesn't have any in room.   BM daily- LBM last night.   ROS:   Pt denies SOB, abd pain, CP, N/V/C/D, and vision changes   Objective: Vital Signs: Blood pressure 91/65, pulse 79, temperature 98.2 F (36.8 C), temperature source Oral, resp. rate 18, height 5\' 9"  (1.753 m), weight 86 kg, SpO2 97 %. No results found. Recent Labs    07/27/19 0400  WBC 8.6  HGB 10.7*  HCT 34.4*  PLT 215   Recent Labs    07/27/19 0400 07/05/2019 0405  NA 133* 134*  K 4.1 4.3  CL 101 101  CO2 24 23  GLUCOSE 99 118*  BUN 13 14  CREATININE 0.97 1.02  CALCIUM 8.5* 8.7*    Physical Exam: BP 91/65 (BP Location: Left Arm)   Pulse 79   Temp 98.2 F (36.8 C) (Oral)   Resp 18   Ht 5\' 9"  (1.753 m)   Wt 86 kg   SpO2 97%   BMI 28.00 kg/m  Constitutional: No distress . Vital signs reviewed. Sitting up EOB, appears a little SOB, some accessory muscle use, NAD HENT: Normocephalic.  Atraumatic. Eyes: conjugate gaze Cardiovascular: (LVAD hum) Respiratory: a little accessory muscle use- but once O2 was Placed (sats 94%- put on 2L), back to baseline- CTA B/L= no Rales, rhonchi, wheezes GI: Soft, NT, ND, (+)BS   Skin: Warm and dry.  Intact. Incision on chest healing- look good Psych: appropriate Musc: No edema in extremities.  No tenderness in extremities. Neuro: Alert Motor: Grossly 4+/5 throughout, improving  Assessment/Plan: 1. Functional deficits secondary to cardiac debility which require 3+ hours per day of interdisciplinary therapy in a comprehensive inpatient rehab setting.  Physiatrist is providing close team supervision and 24 hour management of active medical problems listed below.  Physiatrist and rehab team continue to assess barriers to discharge/monitor patient progress toward functional and medical goals  Care  Tool:  Bathing    Body parts bathed by patient: Right arm, Left arm, Chest, Abdomen, Front perineal area, Buttocks, Right upper leg, Left upper leg, Right lower leg, Left lower leg, Face         Bathing assist Assist Level: Supervision/Verbal cueing     Upper Body Dressing/Undressing Upper body dressing   What is the patient wearing?: Pull over shirt    Upper body assist Assist Level: Independent    Lower Body Dressing/Undressing Lower body dressing      What is the patient wearing?: Underwear/pull up, Pants     Lower body assist Assist for lower body dressing: Supervision/Verbal cueing     Toileting Toileting Toileting Activity did not occur (Clothing management and hygiene only): N/A (no void or bm)  Toileting assist Assist for toileting: Supervision/Verbal cueing     Transfers Chair/bed transfer  Transfers assist     Chair/bed transfer assist level: Supervision/Verbal cueing     Locomotion Ambulation   Ambulation assist      Assist level: Supervision/Verbal cueing Assistive device: Walker-rolling Max distance: 150'   Walk 10 feet activity   Assist     Assist level: Supervision/Verbal cueing Assistive device: Walker-rolling   Walk 50 feet activity   Assist    Assist level: Supervision/Verbal cueing Assistive device: Walker-rolling    Walk 150 feet activity   Assist Walk 150 feet activity did not occur: Safety/medical concerns  Assist level: Supervision/Verbal  cueing Assistive device: Walker-rolling    Walk 10 feet on uneven surface  activity   Assist     Assist level: Minimal Assistance - Patient > 75% Assistive device: Hand held assist   Wheelchair     Assist Will patient use wheelchair at discharge?: Yes (Per PT long-term goals ) Type of Wheelchair: Manual    Wheelchair assist level: Supervision/Verbal cueing Max wheelchair distance: 150    Wheelchair 50 feet with 2 turns activity    Assist         Assist Level: Supervision/Verbal cueing   Wheelchair 150 feet activity     Assist     Assist Level: Supervision/Verbal cueing      Medical Problem List and Plan: 1.  Debility secondary to STEMI/LVAD 06/25/2019.  Sternal precautions  Continue CIR 2.  Antithrombotics: -DVT/anticoagulation: Coumadin  INR therapeutic on 7/25  7/26- INR dropped to 1.5- boosting warfarin dose to avoid IV heparin, if possible.             -antiplatelet therapy: Aspirin 81 mg daily 3. Pain Management: Neurontin 300 mg 3 times daily, oxycodone/tramadol as needed  7/27 - pain controlled- con't regimen             Monitor with increased exertion 4. Mood: Melatonin 5 mg nightly, Klonopin 0.5 mg twice daily, Xanax as needed, trazodone  7/26- appears a little flat/overwhelmed- con't to monitor closely-             -antipsychotic agents: N/A 5. Neuropsych: This patient is capable of making decisions on his own behalf. 6. Skin/Wound Care: Routine skin checks  7/26- needs drive line nachors per nursing- they will call LVAD team. 7. Fluids/Electrolytes/Nutrition: Routine in and outs.   8.  VF requiring DCCV.  Follow-up cardiology services.  Continue amiodarone 400 mg twice daily as well as Lanoxin 0.125 mg daily, Ranexa 500 mg twice daily 9.  Systolic congestive heart failure.  Monitor for any signs of fluid overload. Filed Weights   07/25/19 0000 07/26/19 0651 07/11/2019 0436  Weight: 84.1 kg 86 kg 86 kg   7/27- weight 86 kg today- same as 2 days ago 10.  Hyperlipidemia.  Continue Lipitor 11.  Tobacco abuse.  Counseling 12.  Renal cell carcinoma.  MRI of abdomen showed a 1.8 cm right renal lesion.  Patient would follow-up outpatient with urology services. 13. Constipation.  MiraLAX daily, Colace 100 mg daily.    Improving  Adjust bowel meds as necessary 14.  Hypotension  On digoxin 0.125  On Klonopin, will consider decreasing asymptomatic  7/26- BP 88/70- per heart failure team  7/27- BP 91/65-  soft- per heart failure- no mention in their notes about any concerns about BP 15.  Hyponatremia  Sodium 135 on 7/24, improving  7/26- Na down slightly to 133- will monitor 16. Per heart failure team, keep K+ >4.0 (is 4.1) and Mg >2.0 (is 2.1)- con't repletion as required- not on anything standing at this time.   17. SOB  7/27- after PT this AM- resolved with a little O2- will monitor   LOS: 4 days A FACE TO FACE EVALUATION WAS PERFORMED  Audra Bellard 07/26/2019, 9:19 AM

## 2019-07-28 NOTE — Progress Notes (Deleted)
See  MD progress note.

## 2019-07-28 NOTE — Anesthesia Preprocedure Evaluation (Signed)
Anesthesia Evaluation  Patient identified by MRN, date of birth, ID band Patient awake    Reviewed: Allergy & Precautions, NPO status , Patient's Chart, lab work & pertinent test results  Airway Mallampati: II  TM Distance: >3 FB     Dental   Pulmonary Current Smoker,    breath sounds clear to auscultation       Cardiovascular hypertension, + CAD, + Past MI and +CHF   Rhythm:Regular Rate:Normal     Neuro/Psych negative neurological ROS     GI/Hepatic negative GI ROS, Neg liver ROS,   Endo/Other  negative endocrine ROS  Renal/GU Renal disease     Musculoskeletal   Abdominal   Peds  Hematology negative hematology ROS (+)   Anesthesia Other Findings   Reproductive/Obstetrics                             Anesthesia Physical Anesthesia Plan  ASA: IV  Anesthesia Plan: General   Post-op Pain Management:    Induction: Intravenous  PONV Risk Score and Plan: 1 and Treatment may vary due to age or medical condition  Airway Management Planned: Natural Airway and Mask  Additional Equipment: None  Intra-op Plan:   Post-operative Plan:   Informed Consent: I have reviewed the patients History and Physical, chart, labs and discussed the procedure including the risks, benefits and alternatives for the proposed anesthesia with the patient or authorized representative who has indicated his/her understanding and acceptance.       Plan Discussed with:   Anesthesia Plan Comments:         Anesthesia Quick Evaluation

## 2019-07-28 NOTE — Plan of Care (Signed)
  Problem: Consults Goal: RH GENERAL PATIENT EDUCATION Description: See Patient Education module for education specifics. Outcome: Progressing   Problem: RH BOWEL ELIMINATION Goal: RH STG MANAGE BOWEL WITH ASSISTANCE Description: STG Manage Bowel with Assistance. Mod I Outcome: Progressing   Problem: RH SKIN INTEGRITY Goal: RH STG ABLE TO PERFORM INCISION/WOUND CARE W/ASSISTANCE Description: STG Able To Perform Incision/Wound Care With Assistance. Min Outcome: Progressing   Problem: RH SAFETY Goal: RH STG ADHERE TO SAFETY PRECAUTIONS W/ASSISTANCE/DEVICE Description: STG Adhere to Safety Precautions With Assistance/Device. Mod I Outcome: Progressing   Problem: RH PAIN MANAGEMENT Goal: RH STG PAIN MANAGED AT OR BELOW PT'S PAIN GOAL Description: Less than 3 Outcome: Progressing

## 2019-07-28 NOTE — H&P (Addendum)
Advanced Heart Failure VAD History and Physical Note   PCP-Cardiologist: Dr. Haroldine Laws   Reason for Admission: Ventricular Fibrillation   HPI:     54 y/o male initially admitted 06/17/19 w/ acute anterolateral STEMI and cardiogenic shock requiring mechanical support w/ IABP. Cath showed severe multivessel CAD not amendable to PCI. Also felt to be poor candidate for CABG given poor targets. Echo showed severe LV dysfunction, EF <20%, RV mildly reduced. W/ severe LV dysfunction and little coronary perfusion, LVAD was recommended. Not a candidate for transplant w/ active tobacco use.   06/25/19, he underwent HMIII LVAD under DT criteria.   Post-op course c/b recurrent VT/ VF/ Torsades requiring several emergent DCCVs. -Had 2 episodes of VF requiring emergent DC-CV on 07/02/19 - Recurrent VF on 7/2 and broke spontaneously.  - 7/3 Recurrent VF at 2a.->emergent DC-CV.  - 07/07/2019 VT/Torsades -> repeat DC_CV - 07/08/2019 VF/Torsades x2-->DC-CV x2.  EP consulted and recommended amiodarone and Mexiletine. VT/VF ultimately stabilized and he was continued on amiodarone, 400 mg BID. Mexiletine discontinued per EP.   On 7/8 developed sepsis/PNA requiring intubation. Placed on abx. 07/14/19 IABP removed and extubated.  7/23 he was transferred to CIR.   Today, he developed increased dyspnea and increased work of breathing w/ new supp O2 requirements after his afternoon therapy session. No CP. Feels dizzy.   Low PIs observed on VAD interrogation, lowest 1.8  STAT EKG obtained showing VF rate 210 bpm. Doppler MAP stable 92. Transferred back to ICU for emergent DCCV. S/p successful DCCV back to NSR.   LVAD INTERROGATION:  HeartMate II LVAD:  Flow 3.6 liters/min, speed 5250, power 3.5, PI 2.8 (lowest PI 1.8)     Review of Systems: [y] = yes, '[ ]'  = no   General: Weight gain '[ ]' ; Weight loss '[ ]' ; Anorexia '[ ]' ; Fatigue [Y]; Fever '[ ]' ; Chills '[ ]' ; Weakness '[ ]'   Cardiac: Chest  pain/pressure '[ ]' ; Resting SOB [ Y]; Exertional SOB [Y ]; Orthopnea '[ ]' ; Pedal Edema '[ ]' ; Palpitations '[ ]' ; Syncope '[ ]' ; Presyncope [Y ]; Paroxysmal nocturnal dyspnea'[ ]'   Pulmonary: Cough '[ ]' ; Wheezing'[ ]' ; Hemoptysis'[ ]' ; Sputum '[ ]' ; Snoring '[ ]'   GI: Vomiting'[ ]' ; Dysphagia'[ ]' ; Melena'[ ]' ; Hematochezia '[ ]' ; Heartburn'[ ]' ; Abdominal pain '[ ]' ; Constipation '[ ]' ; Diarrhea '[ ]' ; BRBPR '[ ]'   GU: Hematuria'[ ]' ; Dysuria '[ ]' ; Nocturia'[ ]'   Vascular: Pain in legs with walking '[ ]' ; Pain in feet with lying flat '[ ]' ; Non-healing sores '[ ]' ; Stroke '[ ]' ; TIA '[ ]' ; Slurred speech '[ ]' ;  Neuro: Headaches'[ ]' ; Vertigo'[ ]' ; Seizures'[ ]' ; Paresthesias'[ ]' ;Blurred vision '[ ]' ; Diplopia '[ ]' ; Vision changes '[ ]'   Ortho/Skin: Arthritis '[ ]' ; Joint pain '[ ]' ; Muscle pain '[ ]' ; Joint swelling '[ ]' ; Back Pain '[ ]' ; Rash '[ ]'   Psych: Depression'[ ]' ; Anxiety'[ ]'   Heme: Bleeding problems '[ ]' ; Clotting disorders '[ ]' ; Anemia '[ ]'   Endocrine: Diabetes '[ ]' ; Thyroid dysfunction'[ ]'     Home Medications Prior to Admission medications   Medication Sig Start Date End Date Taking? Authorizing Provider  acetaminophen (TYLENOL) 325 MG tablet Take 2 tablets (650 mg total) by mouth every 4 (four) hours as needed for headache or mild pain. 07/13/2019   Angiulli, Lavon Paganini, PA-C  ALPRAZolam Duanne Moron) 0.25 MG tablet Take 1 tablet (0.25 mg total) by mouth 3 (three) times daily as needed for anxiety. 07/03/2019   Angiulli, Lavon Paganini, PA-C  amiodarone (PACERONE)  400 MG tablet Take 1 tablet (400 mg total) by mouth 2 (two) times daily. 07/23/19   Consuelo Pandy, PA-C  aspirin 81 MG chewable tablet Chew 1 tablet (81 mg total) by mouth daily. 07/23/19   Lyda Jester M, PA-C  atorvastatin (LIPITOR) 80 MG tablet Take 1 tablet (80 mg total) by mouth daily. 07/23/19   Lyda Jester M, PA-C  clonazePAM (KLONOPIN) 0.5 MG tablet Take 1 tablet (0.5 mg total) by mouth 2 (two) times daily. 07/14/2019   Angiulli, Lavon Paganini, PA-C  digoxin (LANOXIN) 0.125 MG tablet Take 1 tablet (0.125  mg total) by mouth daily. 07/23/19   Lyda Jester M, PA-C  folic acid (FOLVITE) 1 MG tablet Take 1 tablet (1 mg total) by mouth daily. 07/23/19   Lyda Jester M, PA-C  gabapentin (NEURONTIN) 600 MG tablet Take 0.5 tablets (300 mg total) by mouth 3 (three) times daily. 07/23/19   Lyda Jester M, PA-C  melatonin 5 MG TABS Take 1 tablet (5 mg total) by mouth daily after supper. 07/09/2019   Angiulli, Lavon Paganini, PA-C  Multiple Vitamin (MULTIVITAMIN WITH MINERALS) TABS tablet Take 1 tablet by mouth daily. 07/26/2019   Angiulli, Lavon Paganini, PA-C  oxyCODONE (OXY IR/ROXICODONE) 5 MG immediate release tablet Take 1-2 tablets (5-10 mg total) by mouth every 3 (three) hours as needed for severe pain. 07/03/2019   Angiulli, Lavon Paganini, PA-C  pantoprazole (PROTONIX) 40 MG tablet Take 1 tablet (40 mg total) by mouth daily. 07/23/19   Lyda Jester M, PA-C  polyvinyl alcohol (LIQUIFILM TEARS) 1.4 % ophthalmic solution Place 1 drop into both eyes as needed for dry eyes. 07/30/2019   Angiulli, Lavon Paganini, PA-C  ranolazine (RANEXA) 500 MG 12 hr tablet Take 1 tablet (500 mg total) by mouth 2 (two) times daily. 07/23/19   Lyda Jester M, PA-C  sildenafil (REVATIO) 20 MG tablet Take 1 tablet (20 mg total) by mouth 3 (three) times daily. 07/24/19   Lyda Jester M, PA-C  thiamine 100 MG tablet Take 1 tablet (100 mg total) by mouth daily. 07/23/19   Lyda Jester M, PA-C  traMADol (ULTRAM) 50 MG tablet Take 1-2 tablets (50-100 mg total) by mouth every 4 (four) hours as needed for moderate pain. 07/26/2019   Angiulli, Lavon Paganini, PA-C  traZODone (DESYREL) 100 MG tablet Take 1 tablet (100 mg total) by mouth at bedtime as needed for sleep. 07/13/2019   Angiulli, Lavon Paganini, PA-C  warfarin (COUMADIN) 6 MG tablet Take 1 tablet (6 mg total) by mouth one time only at 4 PM. 07/07/2019   Angiulli, Lavon Paganini, PA-C    Past Medical History: Past Medical History:  Diagnosis Date  . Acute anterolateral wall MI ([place])   . CAD, multiple  vessel   . Hypertension   . Ischemic cardiomyopathy   . Systolic heart failure ([place])   . Tobacco abuse     Past Surgical History: Past Surgical History:  Procedure Laterality Date  . CARDIOVERSION N/A 07/02/2019   Procedure: CARDIOVERSION;  Surgeon: Jolaine Artist, MD;  Location: [facility] OR;  Service: Cardiovascular;  Laterality: N/A;  . CORONARY/GRAFT ACUTE MI REVASCULARIZATION N/A 06/17/2019   Procedure: Coronary/Graft Acute MI Revascularization;  Surgeon: Yolonda Kida, MD;  Location: [place] [facility] LAB;  Service: Cardiovascular;  Laterality: N/A;  . IABP INSERTION N/A 06/17/2019   Procedure: IABP Insertion;  Surgeon: Yolonda Kida, MD;  Location: [place] [facility];  Service: Cardiovascular;  Laterality: N/A;  . IABP INSERTION N/A 06/24/2019  Procedure: IABP INSERTION;  Surgeon: Jolaine Artist, MD;  Location: [place] [facility];  Service: Cardiovascular;  Laterality: N/A;  . IABP INSERTION N/A 07/09/2019   Procedure: IABP Insertion;  Surgeon: Jolaine Artist, MD;  Location: [place] [facility];  Service: Cardiovascular;  Laterality: N/A;  . INSERTION OF IMPLANTABLE LEFT VENTRICULAR ASSIST DEVICE N/A 06/25/2019   Procedure: INSERTION OF IMPLANTABLE LEFT VENTRICULAR ASSIST DEVICE USING THORATEC HEARTMATE 3;  Surgeon: Wonda Olds, MD;  Location: [facility];  Service: Open Heart Surgery;  Laterality: N/A;  . LEFT HEART CATH AND CORONARY ANGIOGRAPHY N/A 06/17/2019   Procedure: LEFT HEART CATH AND CORONARY ANGIOGRAPHY;  Surgeon: Yolonda Kida, MD;  Location: [place] [facility];  Service: Cardiovascular;  Laterality: N/A;  . RIGHT HEART CATH N/A 06/17/2019   Procedure: RIGHT HEART CATH;  Surgeon: Yolonda Kida, MD;  Location: [place] [facility];  Service: Cardiovascular;  Laterality: N/A;  . RIGHT HEART CATH N/A 06/24/2019   Procedure: RIGHT HEART CATH;  Surgeon: Jolaine Artist, MD;  Location: [place] [facility];  Service: Cardiovascular;  Laterality: N/A;  .  RIGHT HEART CATH N/A 07/09/2019   Procedure: RIGHT HEART CATH;  Surgeon: Jolaine Artist, MD;  Location: [place] [facility];  Service: Cardiovascular;  Laterality: N/A;  . TEE WITHOUT CARDIOVERSION N/A 06/25/2019   Procedure: TRANSESOPHAGEAL ECHOCARDIOGRAM (TEE);  Surgeon: Wonda Olds, MD;  Location: [facility];  Service: Open Heart Surgery;  Laterality: N/A;    Family History: Family History  Problem Relation Age of Onset  . CAD Father   . Valvular heart disease Father     Social History: Social History   Socioeconomic History  . Marital status: Married    Spouse name: Not on file  . Number of children: Not on file  . Years of education: Not on file  . Highest education level: Not on file  Occupational History  . Not on file  Tobacco Use  . Smoking status: Current Every Day Smoker    Packs/day: 1.50    Years: 30.00    Pack years: 45.00  . Smokeless tobacco: Never Used  Substance and Sexual Activity  . Alcohol use: Yes    Alcohol/week: 7.0 - 14.0 standard drinks    Types: 7 - 14 Standard drinks or equivalent per week  . Drug use: Never  . Sexual activity: Not on file  Other Topics Concern  . Not on file  Social History Narrative  . Not on file   Social Determinants of Health   Financial Resource Strain:   . Difficulty of Paying Living Expenses:   Food Insecurity:   . Worried About Charity fundraiser in the Last Year:   . Arboriculturist in the Last Year:   Transportation Needs:   . Film/video editor (Medical):   Marland Kitchen Lack of Transportation (Non-Medical):   Physical Activity:   . Days of Exercise per Week:   . Minutes of Exercise per Session:   Stress:   . Feeling of Stress :   Social Connections:   . Frequency of Communication with Friends and Family:   . Frequency of Social Gatherings with Friends and Family:   . Attends Religious Services:   . Active Member of Clubs or Organizations:   . Attends Archivist Meetings:   Marland Kitchen Marital Status:       Allergies:  Allergies  Allergen Reactions  . Codeine Hives    Objective:  Vital Signs:     Last BM Date: 07/27/19 There were no vitals filed for this visit.  Mean arterial Pressure 92    Physical Exam    General:  Appears apprehensive. Increased WOB. Mildly diaphoretic. HEENT: Normal Neck: supple. JVP elevated to jawline . Carotids 2+ bilat; no bruits. No lymphadenopathy or thyromegaly appreciated. Cor: Mechanical heart sounds with LVAD hum present. Lungs: Clear Abdomen: soft, nontender, nondistended. No hepatosplenomegaly. No bruits or masses. Good bowel sounds. Driveline: C/D/I; securement device intact and driveline incorporated Extremities: no cyanosis, clubbing, rash, edema Neuro: alert & orientedx3, cranial nerves grossly intact. moves all 4 extremities w/o difficulty. Affect pleasant  Telemetry   Ventricular Fibrillation, rates 200s   EKG   Ventricular fibrillation 210 bpm   Labs    Basic Metabolic Panel: Recent Labs  Lab 07/22/19 0420 07/22/19 0420 07/23/19 0243 07/23/19 0243 07/24/19 0302 07/24/19 0302 07/25/19 1514 07/26/19 0423 07/27/19 0400 07/09/2019 0405  NA 134*   < > 134*  --  132*  --  135  --  133* 134*  K 4.5   < > 4.2  --  4.0  --  4.4  --  4.1 4.3  CL 93*   < > 97*  --  98  --  101  --  101 101  CO2 30   < > 27  --  27  --  25  --  24 23  GLUCOSE 97   < > 105*  --  98  --  93  --  99 118*  BUN 18   < > 15  --  13  --  16  --  13 14  CREATININE 1.26*   < > 1.18  --  1.09  --  1.05  --  0.97 1.02  CALCIUM 9.3   < > 9.1   < > 8.7*   < > 8.6*  --  8.5* 8.7*  MG 1.9  --  2.0  --  2.0  --   --  1.8 2.1  --    < > = values in this interval not displayed.    Liver Function Tests: Recent Labs  Lab 07/27/19 0400  AST 67*  ALT 104*  ALKPHOS 117  BILITOT 1.0  PROT 6.0*  ALBUMIN 2.8*   No results for input(s): LIPASE, AMYLASE in the last 168 hours. No results for input(s): AMMONIA in the last 168 hours.  CBC: Recent Labs   Lab 07/22/19 0420 07/23/19 0243 07/24/19 0302 07/27/19 0400  WBC 8.5 8.9 8.9 8.6  NEUTROABS  --   --   --  6.3  HGB 12.1* 12.2* 11.8* 10.7*  HCT 39.2 39.3 38.0* 34.4*  MCV 93.6 93.6 93.8 95.6  PLT 268 285 259 215    Cardiac Enzymes: No results for input(s): CKTOTAL, CKMB, CKMBINDEX, TROPONINI in the last 168 hours.  BNP: BNP (last 3 results) Recent Labs    07/09/19 0313 07/16/19 0346 07/23/19 0243  BNP 1,815.2* 3,431.7* 1,811.9*    ProBNP (last 3 results) No results for input(s): PROBNP in the last 8760 hours.   CBG: Recent Labs  Lab 07/26/19 2105 07/27/19 0613 07/27/19 1202 07/27/19 2113 07/16/2019 0607  GLUCAP 109* 89 102* 138* 146*    Coagulation Studies: Recent Labs    07/26/19 0423 07/27/19 0400 07/03/2019 0405  LABPROT 22.0* 17.8* 16.8*  INR 2.0* 1.5* 1.4*    Other results: EKG: ventricular fibrillation 210 bpm .   Imaging    No results  found.   Assessment/Plan:    1. Ventricular Fibrillation  - Had 2 episodes of VF requiring emergent DC-CV on 07/02/19 - Recurrent VF on 7/2 and broke spontaneously.  - 7/3 Recurrent VF ->emergent DC-CV.  - 07/07/2019 VT/Torsades -> repeat DC-CV - 07/08/2019 VF/Torsades x2-->DC-CV x2. - 7/27 Developed recurrent VF, rate 210 bpm ->>transferred back to 2H and underwent repeat DCCV. Lidocaine given (suspect VF is ischemically mediated)  - back in NSR. - start amio gtt. Load w/ 150 mg bolus x 1>>infussion 60 mg/hr - discuss w/ EP. Restart Mexiletine 200 mg bid - continue Ranexa 500 mg bid - Check STAT Mg. K stable at 4.4  - Keep K > 4.0 and Mg > 2.0.     2. Acute Systolic Heart Failure s/p VAD - EF 34%, RV systolic function mildly reduced  - HMIII LVAD Placed 6/24 - Has had RV failure post VAD - Continue sildenafil & digoxin for RV support  - INR 1.4 today. LDH ok. Coumadin per pharmacy   4. CAD - S/p Acute STEMI 6/21. Emergent cath showed severe multivessel disease w/ 75% mid- distal LM, 100% prox  LAD, not amendable to PCI (unable to cross w/ guide wire, probable CTO), 100% prox LCx (probable CTO) and 100% ostial RCA (probable CTO). There were mildto moderate collaterals right to left to the LAD, minimal right to right collateralsan minimalleft to left collaterals.LVEF 25% - Hs trop peaked 27,000. - No targets for CABG / PCI.  - S/P HMIII VAD - Continue ASA + statin   5. Renal mass = RCC - MRI abdomen- 1.8 cm right renal lesion, most consistent with renal cell carcinoma. No evidence of renal vein involvement or nodal metastasis. - Discussed w/ Urology and R RCC is small and will just require surveillance. Ok to proceed with VAD from their standpoint.   6. Sleep Apnea - frequent apnea at night  - order CPAP QHS  - needs formal sleep study   7. Debility - plan transfer back to CIR once more stable      I reviewed the LVAD parameters from today, and compared the results to the patient's prior recorded data.  No programming changes were made.  The LVAD is functioning within specified parameters.  The patient performs LVAD self-test daily.  LVAD interrogation was negative for any significant power changes, alarms or PI events/speed drops.  LVAD equipment check completed and is in good working order.  Back-up equipment present.   LVAD education done on emergency procedures and precautions and reviewed exit site care.  Length of Stay: 0  Nelida Gores 07/14/2019, 4:48 PM  VAD Team Pager [phone] (7am - 7am) +++VAD ISSUES ONLY+++   Advanced Heart Failure Team Pager [phone] (M-F; [place])  Please contact [facility] for night-coverage after hours (4p -7a ) and weekends on [email or web address] for all non- LVAD Issues  Agree with above.   He developed recurrent VF with significant symptoms and drops in his LVAD flows. Moved emergently to 2H01  The anesthesia team met [place] at the bedside and I performed emergent DC-CV with 200J back to NSR.  General: ill appearing  SOB HEENT: normal  Neck: supple. JVP to ear  Carotids 2+ bilat; no bruits. No lymphadenopathy or thryomegaly appreciated. Cor: LVAD hum.  Lungs: Clear. Abdomen: obese soft, nontender, non-distended. No hepatosplenomegaly. No bruits or masses. Good bowel sounds. Driveline site clean. Anchor in place.  Extremities: no cyanosis, clubbing, rash. Warm trace dema  Neuro: alert & oriented  x 3. No focal deficits. Moves all 4 without problem     Recurrent VF despite VAD support. Now s/p DC-CV  I spoke with Dr. Caryl Comes. Will reload IV amio. I gave him 100 mg lidocaine. Will restart mexilitene 200 bid and Ranexa 500 bid. Keep in ICU.    CRITICAL CARE Performed by: Glori Bickers  Total critical care time: 35 minutes  Critical care time was exclusive of separately billable procedures and treating other patients.  Critical care was necessary to treat or prevent imminent or life-threatening deterioration.  Critical care was time spent personally by me (independent of midlevel providers or residents) on the following activities: development of treatment plan with patient and/or surrogate as well as nursing, discussions with consultants, evaluation of patient's response to treatment, examination of patient, obtaining history from patient or surrogate, ordering and performing treatments and interventions, ordering and review of laboratory studies, ordering and review of radiographic studies, pulse oximetry and re-evaluation of patient's condition.  Glori Bickers, MD  5:19 PM

## 2019-07-28 NOTE — Progress Notes (Signed)
Called to room by PT at patient remains short of breath at rest. . Respirations from 20 to 26. Silvestre Mesi PA made aware. Patient on 2L 02 . Patient alert and oriented x4 and appears very fatigued. 02 readings showing 80-83% with fluctuating heart rate(unsure of accuracy) Capillary refill less than 3, extremities warm and pink. Ellen Henri PA at bedside to assess patient. Orders received for EKG. EKG complete and results given to Ellen Henri PA. VAD team arrived and patient transferred to [place] about 1430. Report called to [place].

## 2019-07-28 NOTE — CV Procedure (Signed)
    DIRECT CURRENT CARDIOVERSION  NAME:  Aaron [name]   MRN: [id number] DOB:  02-16-1965   ADMIT DATE: 07/09/2019   INDICATIONS: Ventricular fibrillation    PROCEDURE:   Emergent consent. Once an appropriate time out was taken, the patient had the defibrillator pads placed in the anterior and posterior position. The patient then underwent sedation by the anesthesia service. Once an appropriate level of sedation was achieved, the patient received a single biphasic, synchronized 200J shock with prompt conversion to sinus rhythm. No apparent complications.  Glori Bickers, MD  5:20 PM

## 2019-07-28 NOTE — Progress Notes (Signed)
   Patient developed recurrent VF. MAPs in 48s. VAD parameters dropped slightly but flows still ok.   Developed SOB and apprehension.   I increased amio drip back to 60/hr and bolused with lido. Lido drip started at 1. 2g mag ordered.   Given persistent VF. Anesthesia was paged for emergent DC-CV.   Case d/w Dr. Ola Spurr. Patient sedated with propofol and underwent DC-CV to NSR.    CCT 40 mins (not including procedural time)  Glori Bickers, MD  10:37 PM

## 2019-07-28 NOTE — Transfer of Care (Signed)
Immediate Anesthesia Transfer of Care Note  Patient: Aaron [name]  Procedure(s) Performed: AN AD [place]  Patient Location: ICU  Anesthesia Type:General  Level of Consciousness: awake and alert   Airway & Oxygen Therapy: Patient Spontanous Breathing and Patient connected to nasal cannula oxygen  Post-op Assessment: Report given to RN and Post -op Vital signs reviewed and stable  Post vital signs: Reviewed and stable  Last Vitals:  Vitals Value Taken Time  BP    Temp    Pulse 29 07/26/2019 1530  Resp 23 07/25/2019 1532  SpO2 96 % 07/07/2019 1530  Vitals shown include unvalidated device data.  Last Pain: There were no vitals filed for this visit.       Complications: No complications documented.

## 2019-07-28 NOTE — Progress Notes (Signed)
Inpatient Rehabilitation Care Coordinator  Discharge Note  The overall goal for the admission was met for:   Discharge location: Yes. Pt d/c to acute hospital due to medical  Concerns.   Length of Stay: Yes. 3 days.   Discharge activity level: Yes. Min A.   Home/community participation: Yes. Limited.   Services provided included: MD, RD, PT, OT, RN, CM, TR, Pharmacy, Neuropsych and SW  Financial Services: Other: BCBS  Follow-up services arranged: N/A  Comments (or additional information):  Patient/Family verbalized understanding of follow-up arrangements: Yes  Individual responsible for coordination of the follow-up plan: N/A  Confirmed correct DME delivered: Rana Snare 07/04/2019    Rana Snare

## 2019-07-28 NOTE — Progress Notes (Signed)
CSW met briefly at bedside with patient. He shared the earlier experience of heart rhythm and states "I know when it is going to happen" and reported that he was feeling "very SOB like I couldn't catch my breathe". Patient stated he is feeling a bit fatigued and anxious about the events of the day. Patient states he is hoping to "just fall asleep and wake up tomorrow morning". CSW provided supportive intervention and will continue to follow as needed. Raquel Sarna, [place], [employer]

## 2019-07-28 NOTE — Discharge Summary (Signed)
Physician Discharge Summary  Patient ID: Aaron [name] MRN: [id number] DOB/AGE: 07-04-1965 54 y.o.  Admit date: 07/24/2019 Discharge date: 07/29/2019  Discharge Diagnoses:  Principal Problem:   Debility Active Problems:   Hyponatremia   Drug-induced hypotension   Chronic anticoagulation Pain management Systolic congestive heart failure VF requiring DCCV Mood stabilization Constipation  Discharged Condition: Guarded  Significant Diagnostic Studies: CT ABDOMEN PELVIS WO CONTRAST  Result Date: 07/09/2019 CLINICAL DATA:  54 year old male with worsening heart failure symptoms. Elevated white blood cell. EXAM: CT CHEST, ABDOMEN AND PELVIS WITHOUT CONTRAST TECHNIQUE: Multidetector CT imaging of the chest, abdomen and pelvis was performed following the standard protocol without IV contrast. COMPARISON:  CT dated 06/17/2019. FINDINGS: CT CHEST FINDINGS Cardiovascular: There is moderate cardiomegaly increased in size since the prior CT. There has been interval placement of a left ventricular assist device. There is somewhat loculated appearing fluid in the anterior mediastinum and surrounding the outflow graft. This is nonspecific and although may represent postsurgical seroma, infection is not excluded. Clinical correlation is recommended. The fluid in the anterior mediastinum measures 8.5 x 3.9 cm in greatest axial dimensions. Tiny foci of air noted within this fluid. No fluid noted along the drive line in the anterior chest wall. There is mild atherosclerotic calcification of the aorta. There is mild dilatation of the main pulmonary trunk suggestive of a degree of pulmonary hypertension. There is advanced 3 vessel coronary vascular calcification. There is a faint 2 cm rounded high attenuation superior to the aortic valve, in the sinotubular aorta of indeterminate etiology. Although this may represent pooling of residual contrast, a blood clot is not excluded. There is a right-sided PICC with tip in  central SVC close to the cavoatrial junction. Mediastinum/Nodes: Mildly enlarged pretracheal lymph node measures 15 mm in short axis. No hilar adenopathy. The esophagus is grossly unremarkable. Lungs/Pleura: There are small bilateral pleural effusions, left greater right. There is partial consolidative changes of the left lower lobe with air bronchograms which may represent atelectasis or pneumonia. There is extensive interstitial and interlobular septal prominence and ground-glass opacity throughout the right lung and to a lesser degree involving the left upper lobe. Findings most likely represent asymmetric edema. ARDS or pneumonia are not excluded. There is no pneumothorax. The central airways are patent. Musculoskeletal: Median sternotomy wires. Degenerative changes of the spine. No acute osseous pathology. CT ABDOMEN PELVIS FINDINGS No intra-abdominal free air or free fluid. Hepatobiliary: Probable mild fatty liver. No intrahepatic biliary dilatation. High attenuating content within the gallbladder likely vicarious excretion of recently administered contrast. Pancreas: Unremarkable. No pancreatic ductal dilatation or surrounding inflammatory changes. Spleen: Normal in size without focal abnormality. Adrenals/Urinary Tract: The adrenal glands are unremarkable. There is a 4 mm nonobstructing right renal upper pole calculus. There is no hydronephrosis on either side. Indeterminate subcentimeter partially exophytic focus from the posterior inferior pole of the right kidney is not characterized. The previously described lesion in the interpolar aspect of the right kidney is not visualized on this noncontrast CT. The visualized ureters and urinary bladder appear unremarkable. Stomach/Bowel: There is no bowel obstruction or active inflammation. The appendix is normal. Vascular/Lymphatic: Mild aortoiliac atherosclerotic disease. The IVC is unremarkable. No portal venous gas. There is no adenopathy. There is stranding  of the retroperitoneal fat and tissue plane surrounding the iliac vasculature, likely related to recent line placement. No fluid collection or large hematoma. Reproductive: The prostate and seminal vesicles are grossly unremarkable. No pelvic mass. Other: There is mild diffuse subcutaneous edema. No  fluid collection. Musculoskeletal: Degenerative changes of the spine. No acute osseous pathology. IMPRESSION: 1. Interval placement LVAD with loculated appearing fluid in the anterior mediastinum and surrounding the outflow graft. This is nonspecific and although may represent postsurgical seroma, infection is not excluded. Clinical correlation is recommended. 2. Ill-defined faint high attenuating content above the aortic valve leaflets suboptimally characterized. Blood clot is not excluded. 3. Small bilateral pleural effusions and findings of asymmetric edema. Pneumonia is not excluded. 4. No bowel obstruction. Normal appendix. 5. A 4 mm nonobstructing right renal upper pole calculus. No hydronephrosis. 6. Aortic Atherosclerosis (ICD10-I70.0). These results were called by telephone at the time of interpretation on 07/09/2019 at 5:15 pm to Dr. Orvan Seen nurse, Valentina Gu, who verbally acknowledged these results. Electronically Signed   By: Anner Crete M.D.   On: 07/09/2019 17:49   DG Chest 1 View  Result Date: 07/02/2019 CLINICAL DATA:  Shortness of breath.  Open heart surgery. EXAM: CHEST  1 VIEW COMPARISON:  06/30/2019. FINDINGS: Overlying EKG lines and pacer pad and noted. Right PICC line noted stable position. Prior median sternotomy. Left ventricular assist device noted stable position. Stable cardiomegaly. No pulmonary venous congestion. Interval clearing of previously identified interstitial prominence. Low lung volumes. No pneumothorax. No acute bony abnormality identified. Carotid vascular calcification. IMPRESSION: 1.  Right PICC line stable position. 2. Prior median sternotomy. Left ventricular assist  device in stable position. Stable cardiomegaly. No pulmonary venous congestion. Interval clearing of previously identified interstitial prominence. 3.  Low lung volumes. 3.  Carotid vascular disease. Electronically Signed   By: Marcello Moores  Register   On: 07/02/2019 09:30   DG Chest 2 View  Result Date: 06/30/2019 CLINICAL DATA:  History of open heart surgery. EXAM: CHEST - 2 VIEW COMPARISON:  06/28/2019. FINDINGS: Interim removal of right IJ sheath. Interim placement right PICC line, its tip is at the cavoatrial junction. Prior median sternotomy. Left ventricular assist device again noted. Cardiomegaly. Mild bilateral interstitial prominence. Mild CHF cannot be excluded. Tiny left pleural effusion. No pneumothorax. IMPRESSION: 1. Interim removal of right IJ sheath. Interim placement of right PICC line, its tip is at the cavoatrial junction. 2. Prior median sternotomy. Left ventricular assist device again noted. Cardiomegaly. Mild bilateral interstitial prominence. Tiny left pleural effusion. Mild CHF cannot be excluded. Electronically Signed   By: Marcello Moores  Register   On: 06/30/2019 08:22   CT CHEST WO CONTRAST  Result Date: 07/09/2019 CLINICAL DATA:  54 year old male with worsening heart failure symptoms. Elevated white blood cell. EXAM: CT CHEST, ABDOMEN AND PELVIS WITHOUT CONTRAST TECHNIQUE: Multidetector CT imaging of the chest, abdomen and pelvis was performed following the standard protocol without IV contrast. COMPARISON:  CT dated 06/17/2019. FINDINGS: CT CHEST FINDINGS Cardiovascular: There is moderate cardiomegaly increased in size since the prior CT. There has been interval placement of a left ventricular assist device. There is somewhat loculated appearing fluid in the anterior mediastinum and surrounding the outflow graft. This is nonspecific and although may represent postsurgical seroma, infection is not excluded. Clinical correlation is recommended. The fluid in the anterior mediastinum measures 8.5  x 3.9 cm in greatest axial dimensions. Tiny foci of air noted within this fluid. No fluid noted along the drive line in the anterior chest wall. There is mild atherosclerotic calcification of the aorta. There is mild dilatation of the main pulmonary trunk suggestive of a degree of pulmonary hypertension. There is advanced 3 vessel coronary vascular calcification. There is a faint 2 cm rounded high attenuation superior to the  aortic valve, in the sinotubular aorta of indeterminate etiology. Although this may represent pooling of residual contrast, a blood clot is not excluded. There is a right-sided PICC with tip in central SVC close to the cavoatrial junction. Mediastinum/Nodes: Mildly enlarged pretracheal lymph node measures 15 mm in short axis. No hilar adenopathy. The esophagus is grossly unremarkable. Lungs/Pleura: There are small bilateral pleural effusions, left greater right. There is partial consolidative changes of the left lower lobe with air bronchograms which may represent atelectasis or pneumonia. There is extensive interstitial and interlobular septal prominence and ground-glass opacity throughout the right lung and to a lesser degree involving the left upper lobe. Findings most likely represent asymmetric edema. ARDS or pneumonia are not excluded. There is no pneumothorax. The central airways are patent. Musculoskeletal: Median sternotomy wires. Degenerative changes of the spine. No acute osseous pathology. CT ABDOMEN PELVIS FINDINGS No intra-abdominal free air or free fluid. Hepatobiliary: Probable mild fatty liver. No intrahepatic biliary dilatation. High attenuating content within the gallbladder likely vicarious excretion of recently administered contrast. Pancreas: Unremarkable. No pancreatic ductal dilatation or surrounding inflammatory changes. Spleen: Normal in size without focal abnormality. Adrenals/Urinary Tract: The adrenal glands are unremarkable. There is a 4 mm nonobstructing right  renal upper pole calculus. There is no hydronephrosis on either side. Indeterminate subcentimeter partially exophytic focus from the posterior inferior pole of the right kidney is not characterized. The previously described lesion in the interpolar aspect of the right kidney is not visualized on this noncontrast CT. The visualized ureters and urinary bladder appear unremarkable. Stomach/Bowel: There is no bowel obstruction or active inflammation. The appendix is normal. Vascular/Lymphatic: Mild aortoiliac atherosclerotic disease. The IVC is unremarkable. No portal venous gas. There is no adenopathy. There is stranding of the retroperitoneal fat and tissue plane surrounding the iliac vasculature, likely related to recent line placement. No fluid collection or large hematoma. Reproductive: The prostate and seminal vesicles are grossly unremarkable. No pelvic mass. Other: There is mild diffuse subcutaneous edema. No fluid collection. Musculoskeletal: Degenerative changes of the spine. No acute osseous pathology. IMPRESSION: 1. Interval placement LVAD with loculated appearing fluid in the anterior mediastinum and surrounding the outflow graft. This is nonspecific and although may represent postsurgical seroma, infection is not excluded. Clinical correlation is recommended. 2. Ill-defined faint high attenuating content above the aortic valve leaflets suboptimally characterized. Blood clot is not excluded. 3. Small bilateral pleural effusions and findings of asymmetric edema. Pneumonia is not excluded. 4. No bowel obstruction. Normal appendix. 5. A 4 mm nonobstructing right renal upper pole calculus. No hydronephrosis. 6. Aortic Atherosclerosis (ICD10-I70.0). These results were called by telephone at the time of interpretation on 07/09/2019 at 5:15 pm to Dr. Orvan Seen nurse, Valentina Gu, who verbally acknowledged these results. Electronically Signed   By: Anner Crete M.D.   On: 07/09/2019 17:49   CARDIAC  CATHETERIZATION  Result Date: 07/10/2019 Findings: RA = 18 RV = 49/19 PA = 51/32 (39) PCW = 28 Fick cardiac output/index = 5.2/2.5 FA sat = 96% PA sat = 59% Assessment: 1. Elevated biventricular pressures despite VAD support 2. Successful IABP placement Glori Bickers, MD 9:25 PM  DG Chest Port 1 View  Result Date: 07/16/2019 CLINICAL DATA:  Heart failure and respiratory failure. EXAM: PORTABLE CHEST 1 VIEW COMPARISON:  07/15/2019 FINDINGS: Stable cardiac enlargement and visualized LVAD. Swan-Ganz catheter has been removed. Stable mild bibasilar atelectasis. No overt pulmonary edema or significant pleural fluid. No pneumothorax. Stable positioning of right PICC line. IMPRESSION: Stable mild bibasilar atelectasis.  Electronically Signed   By: Aletta Edouard M.D.   On: 07/16/2019 08:32   DG Chest Port 1 View  Result Date: 07/15/2019 CLINICAL DATA:  LVAD, sore chest EXAM: PORTABLE CHEST 1 VIEW COMPARISON:  07/14/2019 FINDINGS: Interval extubation and removal of NG tube. LVAD remains stable. Swan-Ganz catheter tip is in the right lower lobe pulmonary artery beyond the on the right hilum, stable. Left lower lobe atelectasis. No effusions or pneumothorax. IMPRESSION: Interval extubation. Cardiomegaly, left lower lobe atelectasis. No pneumothorax. Swan-Ganz catheter remains in a right lower lobe pulmonary artery beyond the right hilum. Electronically Signed   By: Rolm Baptise M.D.   On: 07/15/2019 08:43   DG Chest Port 1 View  Result Date: 07/14/2019 CLINICAL DATA:  Chest tube, LVAD EXAM: PORTABLE CHEST 1 VIEW COMPARISON:  07/12/2019 FINDINGS: LVAD and remainder of support devices remain in place, unchanged. Cardiomegaly. Left lower lobe atelectasis. No edema or effusions. No pneumothorax. No acute bony abnormality. IMPRESSION: Cardiomegaly, left lower lobe atelectasis. Support devices stable. Electronically Signed   By: Rolm Baptise M.D.   On: 07/14/2019 08:14   DG CHEST PORT 1 VIEW  Result Date:  07/12/2019 CLINICAL DATA:  Reason for exam: LVAD (left ventricular assist device) present in IP EXAM: PORTABLE CHEST 1 VIEW COMPARISON:  Chest x-ray dated 07/11/2019. FINDINGS: Stable cardiomegaly. Decreased interstitial prominence suggesting improved fluid status endotracheal tube is stable in position. Enteric tube passes below the diaphragm. Swan-Ganz catheter is stable in position with tip to the RIGHT of midline. LVAD apparatus appears stable in position. No pneumothorax is seen. IMPRESSION: 1. Stable cardiomegaly. Decreased interstitial prominence suggesting improved fluid status. 2. Support apparatus, including LVAD, appears stable in position. Electronically Signed   By: Franki Cabot M.D.   On: 07/12/2019 08:15   DG CHEST PORT 1 VIEW  Result Date: 07/11/2019 CLINICAL DATA:  Reason for exam: LVAD (left ventricular assist device) present in IP EXAM: PORTABLE CHEST 1 VIEW COMPARISON:  Chest x-ray dated 07/10/2019. FINDINGS: Swan-Ganz catheter is stable in position with tip to the RIGHT of midline. Enteric tube passes below the diaphragm. Endotracheal tube is well positioned with tip above the level of the carina. LEFT ventricular assist device is stable in position. Stable cardiomegaly. Lungs are clear. No pneumothorax is seen. IMPRESSION: 1. Stable chest x-ray. Probable atelectasis and/or small pleural effusion at the LEFT lung base. Stable cardiomegaly. 2. Support apparatus appears stable in position. Electronically Signed   By: Franki Cabot M.D.   On: 07/11/2019 08:51   DG Chest Port 1 View  Result Date: 07/10/2019 CLINICAL DATA:  Intubation. EXAM: PORTABLE CHEST 1 VIEW COMPARISON:  07/09/2019. FINDINGS: Interim placement of Swan-Ganz catheter its tip is in the distal right pulmonary artery. Endotracheal tube, NG tube, right PICC line stable position. Prior CABG. Left ventricular assist device in stable position. Stable cardiomegaly. No pulmonary venous congestion. Mild bilateral interstitial  prominence, improved from prior exam. Bibasilar atelectasis. No pleural effusion or pneumothorax. IMPRESSION: 1. Interim placement Swan-Ganz catheter, its tip is in the distal right pulmonary artery. Remaining lines and tubes in stable position. 2. Prior CABG. Left ventricular assist device stable position. Stable cardiomegaly. No pulmonary venous congestion. 3. Mild bilateral interstitial prominence, improved from prior exam. Bibasilar atelectasis. Electronically Signed   By: Marcello Moores  Register   On: 07/10/2019 07:20   DG CHEST PORT 1 VIEW  Result Date: 07/09/2019 CLINICAL DATA:  54 year old male with endotracheal tube placement. EXAM: PORTABLE CHEST 1 VIEW COMPARISON:  Earlier radiograph dated 07/09/2019. FINDINGS:  Endotracheal tube with tip approximately 4.3 cm above the carina. An enteric tube extends below the diaphragm with tip beyond the inferior margin of the image. Right-sided PICC with tip over central SVC. There is cardiomegaly with vascular congestion and edema. Pneumonia is not excluded clinical correlation is recommended. Small bilateral pleural effusions. No pneumothorax. Median sternotomy wires and left ventricular assist device. No acute osseous pathology. IMPRESSION: 1. Endotracheal tube above the carina. 2. Cardiomegaly with findings of CHF. Electronically Signed   By: Anner Crete M.D.   On: 07/09/2019 19:39   DG CHEST PORT 1 VIEW  Result Date: 07/09/2019 CLINICAL DATA:  dyspnea Pt states he has chest pressure and dizziness EXAM: PORTABLE CHEST 1 VIEW COMPARISON:  Chest radiograph 07/08/2019 FINDINGS: Stable right upper extremity PICC. Left ventricular assist device. Unchanged cardiomediastinal contours with surgical changes status post median sternotomy. There are bilateral right greater than left interstitial opacities which appear mildly increased in the right compared to prior. Consolidation in the lower right lung could reflect atelectasis versus infection. No evidence of  pneumothorax. No definite pleural effusion. IMPRESSION: 1. Bilateral right greater than left interstitial opacities which appear mildly increased on the right compared to prior. 2. Consolidation in the lower right lung could reflect atelectasis versus infection. Electronically Signed   By: Audie Pinto M.D.   On: 07/09/2019 08:13   DG CHEST PORT 1 VIEW  Result Date: 07/08/2019 CLINICAL DATA:  54 year old with personal history of CHF and an indwelling LEFT ventricular assist device. EXAM: PORTABLE CHEST 1 VIEW COMPARISON:  07/02/2019 and earlier, including CTA chest 06/17/2019. FINDINGS: Cardiac silhouette massively enlarged, unchanged, with a LEFT ventricular assist device. RIGHT arm PICC tip projects over the LOWER SVC, unchanged. Since the 07/02/2019 examination, development of perihilar airspace opacities in both lungs, RIGHT greater than LEFT. No visible pleural effusions. IMPRESSION: 1. Stable marked cardiomegaly with LEFT ventricular assist device in place. 2. New bilateral perihilar airspace opacities, RIGHT greater than LEFT, likely pulmonary edema. Electronically Signed   By: Evangeline Dakin M.D.   On: 07/08/2019 09:02   DG CHEST PORT 1 VIEW  Result Date: 06/28/2019 CLINICAL DATA:  Chest pain EXAM: PORTABLE CHEST 1 VIEW COMPARISON:  06/28/2019 FINDINGS: LVAD remains in place. Cardiomegaly, vascular congestion. No visible effusions or pneumothorax. IMPRESSION: No significant change since prior study. Electronically Signed   By: Rolm Baptise M.D.   On: 06/28/2019 23:31   ECHOCARDIOGRAM LIMITED  Result Date: 07/09/2019    ECHOCARDIOGRAM LIMITED REPORT   Patient Name:   Aaron [name] Date of Exam: 07/09/2019 Medical Rec #:  [id number]       Height:       69.0 in Accession #:    [id number]      Weight:       207.5 lb Date of Birth:  1965/02/16       BSA:          2.098 m Patient Age:    36 years        BP:           0/0 mmHg Patient Gender: M               HR:           70 bpm. Exam Location:   Inpatient Procedure: Limited Echo, Cardiac Doppler and Color Doppler Indications:    Congestive Heart Failure 428.0 / I50.9  History:        Patient has prior history of Echocardiogram examinations, most  recent 06/29/2019. Cardiomyopathy and CHF, CAD, Arrythmias:STEMI                 and Ventricular Fibrillation; Risk Factors:Current Smoker.                 Cardiogenic shock.  Sonographer:    Vickie Epley RDCS Referring Phys: [phone] AMY D CLEGG IMPRESSIONS  1. Left ventricular ejection fraction, by estimation, is <20%. The left ventricle has severely decreased function. The left ventricle demonstrates global hypokinesis. The left ventricular internal cavity size was mildly dilated. There is mild left ventricular hypertrophy.  2. Right ventricular systolic function is mildly reduced. The right ventricular size is normal.  3. The mitral valve is normal in structure. Trivial mitral valve regurgitation. No evidence of mitral stenosis.  4. The aortic valve is poorly visualized but does not appear to open.  5. Ramp echo done with speed increased from 5000 rpm to 5100 rpm. FINDINGS  Left Ventricle: Left ventricular ejection fraction, by estimation, is <20%. The left ventricle has severely decreased function. The left ventricle demonstrates global hypokinesis. The left ventricular internal cavity size was mildly dilated. There is mild left ventricular hypertrophy. Right Ventricle: The right ventricular size is normal. Right ventricular systolic function is mildly reduced. Mitral Valve: The mitral valve is normal in structure. Trivial mitral valve regurgitation. No evidence of mitral valve stenosis.  LEFT VENTRICLE PLAX 2D LVIDd:         5.75 cm  Loralie Champagne MD Electronically signed by Loralie Champagne MD Signature Date/Time: 07/09/2019/2:49:33 PM    Final    ECHOCARDIOGRAM LIMITED  Result Date: 06/29/2019    ECHOCARDIOGRAM LIMITED REPORT   Patient Name:   Aaron [name] Date of Exam: 06/29/2019 Medical Rec #:   [id number]       Height:       69.0 in Accession #:    [id number]      Weight:       222.7 lb Date of Birth:  1965-02-22       BSA:          2.162 m Patient Age:    54 years        BP:           0/0 mmHg Patient Gender: M               HR:           71 bpm. Exam Location:  Inpatient Procedure: Limited Echo STAT ECHO Indications:    Chest Pain 786.50 / R07.9  History:        Patient has prior history of Echocardiogram examinations, most                 recent 06/19/2019. CHF and Cardiomyopathy, Arrythmias:STEMI; Risk                 Factors:Current Smoker. LVAD.  Sonographer:    Vickie Epley RDCS Referring Phys: [id number] Annora Guderian R BENSIMHON IMPRESSIONS  1. The left ventricle has severely decreased function. The left ventricle demonstrates global hypokinesis. There is severe asymmetric left ventricular hypertrophy.  2. Right ventricular systolic function was not well visualized. The right ventricular size is mildly enlarged.  3. No pericardial effusion seen.  4. FINDINGS  Left Ventricle: The left ventricle has severely decreased function. The left ventricle demonstrates global hypokinesis. The left ventricular internal cavity size was normal in size. There is severe asymmetric left ventricular hypertrophy. Right Ventricle: The right ventricular size is mildly enlarged.  Right ventricular systolic function was not well visualized. Right Atrium: Right atrial size was not well visualized. Pericardium: No pericardial effusion seen. There is no evidence of pericardial effusion. Tricuspid Valve: The tricuspid valve is not well visualized. Tricuspid valve regurgitation not assessed. Aortic Valve: The aortic valve is grossly normal. Pulmonic Valve: The pulmonic valve was normal in structure. Pulmonic valve regurgitation not assessed. Dorris Carnes MD Electronically signed by Dorris Carnes MD Signature Date/Time: 06/29/2019/11:11:58 AM    Final    [place] EKG SITE RITE  Result Date: 06/29/2019 If Site Rite image not attached, placement could  not be confirmed due to current cardiac rhythm.  US Abdomen Limited RUQ  Result Date: 07/09/2019 CLINICAL DATA:  Sepsis EXAM: ULTRASOUND ABDOMEN LIMITED RIGHT UPPER QUADRANT COMPARISON:  MRI abdomen 06/21/2019 FINDINGS: Gallbladder: Normally distended with minimal dependent sludge. Upper normal gallbladder wall thickness. No shadowing calculi, pericholecystic fluid or sonographic Murphy sign. Common bile duct: Diameter: Normal caliber 3 mm diameter Liver: Upper normal echogenicity. No focal hepatic mass or nodularity. No intrahepatic biliary dilatation. Portal vein is patent on color Doppler imaging with normal direction of blood flow towards the liver. Other: Small RIGHT pleural effusion noted. IMPRESSION: Minimal dependent sludge within gallbladder. Small RIGHT pleural effusion. Otherwise negative exam Electronically Signed   By: Lavonia Dana M.D.   On: 07/09/2019 11:26    Labs:  Basic Metabolic Panel: Recent Labs  Lab 07/22/19 0420 07/23/19 0243 07/24/19 0302 07/25/19 1514 07/26/19 0423 07/27/19 0400 07/18/2019 0405  NA 134* 134* 132* 135  --  133* 134*  K 4.5 4.2 4.0 4.4  --  4.1 4.3  CL 93* 97* 98 101  --  101 101  CO2 30 27 27 25   --  24 23  GLUCOSE 97 105* 98 93  --  99 118*  BUN 18 15 13 16   --  13 14  CREATININE 1.26* 1.18 1.09 1.05  --  0.97 1.02  CALCIUM 9.3 9.1 8.7* 8.6*  --  8.5* 8.7*  MG 1.9 2.0 2.0  --  1.8 2.1  --     CBC: Recent Labs  Lab 07/23/19 0243 07/24/19 0302 07/27/19 0400  WBC 8.9 8.9 8.6  NEUTROABS  --   --  6.3  HGB 12.2* 11.8* 10.7*  HCT 39.3 38.0* 34.4*  MCV 93.6 93.8 95.6  PLT 285 259 215    CBG: Recent Labs  Lab 07/26/19 2105 07/27/19 0613 07/27/19 1202 07/27/19 2113 07/13/2019 0607  GLUCAP 109* 89 102* 138* 146*    Brief HPI:   LEMOYNE NESTOR is a 54 y.o. right-handed male with history of hypertension and tobacco abuse as well as systolic congestive heart failure on no prescription medications.  Lives alone independent prior to  admission.  He does have family in the area.  Presented 06/17/2019 to outside hospital with acute onset of chest pain.  EKG in the ED showed normal sinus rhythm with a rate of 96 ST elevation in V1-V3, aVR, ST depressions in inferior lateral leads and Q waves in V1-V3 for diagnosis of STEMI was taken to the cardiac Cath Lab found to have multivessel CAD.  Attempts were made to open the LAD but the wire would not cross.  Echocardiogram with ejection fraction 25%.  He was transferred to [facility].  Patient underwent LVAD implantation 06/25/2019 per Dr. Orvan Seen.  Hospital course complicated by 2 episodes of ventricular fibrillation requiring emergent DCCV on 07/02/2019 with recurrent V. fib 07/03/2019 and broke spontaneously.  Hospital  course complicated by sepsis due to pneumonia requiring intubation for a short time and extubated 07/14/2019.  Follow-up cardiology services for systolic congestive heart failure requiring ProAmatine.  Chronic Coumadin initiated.  Hospital course findings incidental of renal mass on MRI of abdomen 1.8 cm right renal lesion consistent with renal cell carcinoma.  No evidence of renal vein involvement or nodal metastasis.  Discussed with urology services at this time requiring surveillance follow-up outpatient.  Palliative care was consulted for goals of care.  Patient was admitted for a comprehensive rehab program   Hospital Course: TAMAS SUEN was admitted to rehab 07/24/2019 for inpatient therapies to consist of PT, ST and OT at least three hours five days a week. Past admission physiatrist, therapy team and rehab RN have worked together to provide customized collaborative inpatient rehab.  Pertaining to patient's debility secondary to STEMI with LVAD 06/25/2019.  Patient followed closely by cardiology services he remained on chronic Coumadin therapy monitoring for any bleeding episodes.  Pain control with Neurontin scheduled oxycodone and tramadol as needed.  Mood stabilization  with melatonin to help with sleep Klonopin twice daily.  Systolic congestive heart failure patient with increasing shortness of breath hesitant for diuresis as per cardiology services who were contacted and due to increasing shortness of breath patient was transferred to the heart unit for ongoing care.  During his rehab stay he continued on Lipitor for hyperlipidemia.  He did have a history of tobacco abuse receiving counseling.  He would follow-up outpatient in regards to incidental findings of 1.8 cm right renal lesion with urology services.   Blood pressures were monitored on TID basis and soft and monitored     Rehab course: During patient's stay in rehab weekly team conferences were held to monitor patient's progress, set goals and discuss barriers to discharge. At admission, patient required minimal assist to moderate assist 240 feet 1 person hand-held assist minimal assist stand pivot transfers minimal guard sit to supine.  Minimal assist upper body bathing minimal assist upper body dressing set up lower body dressing supervision lower body bathing  Physical exam.  Blood pressure 104/84 pulse 70 temperature 97.6 respiration 26 oxygen saturation 97% room air HEENT.  Patient complains of shortness of breath Head.  Normocephalic and atraumatic Eyes.  Pupils round and reactive to light no discharge without nystagmus Neck.  Supple nontender no JVD without thyromegaly Cardiac rate controlled Abdomen.  Soft nontender positive bowel sounds Respiratory effort normal no respiratory distress Neurological alert somewhat anxious motor strength 4+/5 throughout  He/  has had improvement in activity tolerance, balance, postural control as well as ability to compensate for deficits. He/ has had improvement in functional use RUE/LUE  and RLE/LLE as well as improvement in awareness.  Patient's therapies remain limited due to some increasing shortness of as well as bouts of dizziness.  As of 07/27/2019 he was  ambulating 150 feet rolling walker supervision needing rest breaks.  Gather belongings for activities day living and homemaking again needing rest breaks.  With noted increasing shortness of breath on the morning and afternoon of 07/15/2019 cardiology services consulted patient transferred to heart unit for ongoing care.       Disposition: Transferred to acute care services    Diet: Heart healthy  Special Instructions: Continue Coumadin per pharmacy protocol  Medications at discharge Documented as per cardiology services  30-35 minutes were spent completing discharge summary  Discharge Instructions    Ambulatory referral to Physical Medicine Rehab   Complete by: As  directed    No scheduled follow up needed       Follow-up Information    Lovorn, Jinny Blossom, MD Follow up.   Specialty: Physical Medicine and Rehabilitation Why: Only as needed Contact information: [address] [place] [address] [phone]        Bensimhon, Shaune Pascal, MD Follow up.   Specialty: Cardiology Why: Call for appointment Contact information: [place] [address] [phone]        Wonda Olds, MD Follow up.   Specialty: Cardiothoracic Surgery Why: Call for appointment Contact information: [place] [address] [phone]        Alexis Frock, MD Follow up.   Specialty: Urology Why: Call for appointment for evaluation of renal mass Contact information: [place] [address] [phone]               Signed: Lavon Paganini [place] 07/02/2019, 2:39 PM

## 2019-07-28 NOTE — Anesthesia Preprocedure Evaluation (Signed)
Anesthesia Evaluation  Patient identified by MRN, date of birth, ID band Patient awake  General Assessment Comment:New onset V-fib in LVAD patient.  Reviewed: Allergy & Precautions, NPO status , Patient's Chart, lab work & pertinent test resultsPreop documentation limited or incomplete due to emergent nature of procedure.  Airway Mallampati: II  TM Distance: >3 FB Neck ROM: Full    Dental  (+) Teeth Intact, Dental Advisory Given   Pulmonary neg pulmonary ROS, Current Smoker,    Pulmonary exam normal breath sounds clear to auscultation       Cardiovascular hypertension, + CAD, + Past MI and +CHF   Rhythm:Irregular Rate:Abnormal  S/p LVAD V-fib   Neuro/Psych negative neurological ROS     GI/Hepatic negative GI ROS, Neg liver ROS,   Endo/Other  negative endocrine ROS  Renal/GU Renal disease (RCC)     Musculoskeletal negative musculoskeletal ROS (+)   Abdominal   Peds  Hematology  (+) Blood dyscrasia, anemia ,   Anesthesia Other Findings Day of surgery medications reviewed with the patient.  Reproductive/Obstetrics                             Anesthesia Physical Anesthesia Plan  ASA: IV and emergent  Anesthesia Plan: General   Post-op Pain Management:    Induction: Intravenous  PONV Risk Score and Plan: 1 and Propofol infusion and Treatment may vary due to age or medical condition  Airway Management Planned: Mask  Additional Equipment:   Intra-op Plan:   Post-operative Plan:   Informed Consent: I have reviewed the patients History and Physical, chart, labs and discussed the procedure including the risks, benefits and alternatives for the proposed anesthesia with the patient or authorized representative who has indicated his/her understanding and acceptance.     Only emergency history available  Plan Discussed with: CRNA  Anesthesia Plan Comments: (Charting completed after  procedure due to emergent nature of procedure.)        Anesthesia Quick Evaluation

## 2019-07-28 NOTE — H&P (Addendum)
Advanced Heart Failure VAD History and Physical Note   PCP-Cardiologist: Dr. Haroldine Laws   Reason for Admission: Ventricular Fibrillation   HPI:    54 y/o male initially admitted 06/17/19 w/ acute anterolateral STEMI and cardiogenic shock requiring mechanical support w/ IABP. Cath showed severe multivessel CAD not amendable to PCI. Also felt to be poor candidate for CABG given poor targets. Echo showed severe LV dysfunction, EF <20%, RV mildly reduced. W/ severe LV dysfunction and little coronary perfusion, LVAD was recommended. Not a candidate for transplant w/ active tobacco use.   06/25/19, he underwent HMIII LVAD under DT criteria.   Post-op course c/b recurrent VT/ VF/ Torsades requiring several emergent DCCVs.   -Had 2 episodes of VF requiring emergent DC-CV on 07/02/19 - Recurrent VF on 7/2 and broke spontaneously.  - 7/3 Recurrent VF at 2a.-> emergent DC-CV.  - 07/07/2019 VT/Torsades -> repeat DC_CV - 07/08/2019 VF/Torsades x2--> DC-CV x2.   EP consulted and recommended amiodarone and Mexiletine. VT/VF ultimately stabilized and he was continued on amiodarone, 400 mg BID. Mexiletine discontinued per EP.    On 7/8 developed sepsis/PNA requiring intubation. Placed on abx. 07/14/19 IABP removed and extubated.   7/23 he was transferred to CIR.   Today, he developed increased dyspnea and increased work of breathing w/ new supp O2 requirements after his afternoon therapy session. No CP. Feels dizzy.   Low PIs observed on VAD interrogation, lowest 1.8   STAT EKG obtained showing VF rate 210 bpm. Doppler MAP stable 92. Transferred back to ICU for emergent DCCV. S/p successful DCCV back to NSR.   LVAD INTERROGATION:  HeartMate II LVAD:  Flow 3.6 liters/min, speed 5250, power 3.5, PI 2.8 (lowest PI 1.8)   Review of Systems: [y] = yes, _0  = no   General: Weight gain _1 ; Weight loss _2 ; Anorexia _3 ; Fatigue [ Y]; Fever _4 ; Chills _5 ; Weakness _6   Cardiac: Chest pain/pressure _7 ;  Resting SOB [ Y]; Exertional SOB [Y ]; Orthopnea _8 ; Pedal Edema _9 ; Palpitations _10 ; Syncope _11 ; Presyncope _12 ; Paroxysmal nocturnal dyspnea_13  Dizziness [Y] Pulmonary: Cough _14 ; Wheezing_15 ; Hemoptysis_16 ; Sputum _17 ; Snoring _18   GI: Vomiting_19 ; Dysphagia_20 ; Melena_21 ; Hematochezia _22 ; Heartburn_23 ; Abdominal pain _24 ; Constipation _25 ; Diarrhea _26 ; BRBPR _27   GU: Hematuria_28 ; Dysuria _29 ; Nocturia_30   Vascular: Pain in legs with walking _31 ; Pain in feet with lying flat _32 ; Non-healing sores _33 ; Stroke _34 ; TIA _35 ; Slurred speech _36 ;  Neuro: Headaches_37 ; Vertigo_38 ; Seizures_39 ; Paresthesias_40 ;Blurred vision _41 ; Diplopia _42 ; Vision changes _43   Ortho/Skin: Arthritis _44 ; Joint pain _45 ; Muscle pain _46 ; Joint swelling _47 ; Back Pain _48 ; Rash _49   Psych: Depression_50 ; Anxiety_51   Heme: Bleeding problems _52 ; Clotting disorders _53 ; Anemia _54   Endocrine: Diabetes _55 ; Thyroid dysfunction_56     Home Medications Prior to Admission medications   Medication Sig Start Date End Date Taking? Authorizing Provider  acetaminophen (TYLENOL) 325 MG tablet Take 2 tablets (650 mg total) by mouth every 4 (four) hours as needed for headache or mild pain. 07/30/2019   Angiulli, Lavon Paganini, PA-C  ALPRAZolam Duanne Moron) 0.25 MG tablet Take 1 tablet (0.25 mg total) by mouth 3 (three) times daily as needed for anxiety. 07/07/2019   Angiulli, Lavon Paganini, PA-C  amiodarone (PACERONE) 400 MG tablet Take 1 tablet (400 mg total) by mouth 2 (two) times daily. 07/23/19   Consuelo Pandy, PA-C  aspirin 81 MG chewable tablet Chew 1 tablet (81 mg total) by mouth daily. 07/23/19   Lyda Jester M, PA-C  atorvastatin (LIPITOR) 80 MG tablet Take 1 tablet (80 mg total) by mouth daily. 07/23/19   Lyda Jester M, PA-C  clonazePAM (KLONOPIN) 0.5 MG tablet Take 1 tablet (0.5 mg total) by mouth 2 (two) times daily. 07/15/2019   Angiulli, Lavon Paganini, PA-C  digoxin (LANOXIN) 0.125 MG tablet Take 1 tablet (0.125 mg  total) by mouth daily. 07/23/19   Lyda Jester M, PA-C  folic acid (FOLVITE) 1 MG tablet Take 1 tablet (1 mg total) by mouth daily. 07/23/19   Lyda Jester M, PA-C  gabapentin (NEURONTIN) 600 MG tablet Take 0.5 tablets (300 mg total) by mouth 3 (three) times daily. 07/23/19   Lyda Jester M, PA-C  melatonin 5 MG TABS Take 1 tablet (5 mg total) by mouth daily after supper. 07/19/2019   Angiulli, Lavon Paganini, PA-C  Multiple Vitamin (MULTIVITAMIN WITH MINERALS) TABS tablet Take 1 tablet by mouth daily. 07/21/2019   Angiulli, Lavon Paganini, PA-C  oxyCODONE (OXY IR/ROXICODONE) 5 MG immediate release tablet Take 1-2 tablets (5-10 mg total) by mouth every 3 (three) hours as needed for severe pain. 07/03/2019   Angiulli, Lavon Paganini, PA-C  pantoprazole (PROTONIX) 40 MG tablet Take 1 tablet (40 mg total) by mouth daily. 07/23/19   Lyda Jester M, PA-C  polyvinyl alcohol (LIQUIFILM TEARS) 1.4 % ophthalmic solution Place 1 drop into both eyes as needed for dry eyes. 07/03/2019   Angiulli, Lavon Paganini, PA-C  ranolazine (RANEXA) 500 MG 12 hr tablet Take 1 tablet (500 mg total) by mouth 2 (two) times daily. 07/23/19   Lyda Jester M, PA-C  sildenafil (REVATIO) 20 MG tablet Take 1 tablet (20 mg total) by mouth 3 (three) times daily. 07/24/19   Lyda Jester M, PA-C  thiamine 100 MG tablet Take 1 tablet (100 mg total) by mouth daily. 07/23/19   Lyda Jester M, PA-C  traMADol (ULTRAM) 50 MG tablet Take 1-2 tablets (50-100 mg total) by mouth every 4 (four) hours as needed for moderate pain. 07/02/2019   Angiulli, Lavon Paganini, PA-C  traZODone (DESYREL) 100 MG tablet Take 1 tablet (100 mg total) by mouth at bedtime as needed for sleep. 07/09/2019   Angiulli, Lavon Paganini, PA-C  warfarin (COUMADIN) 6 MG tablet Take 1 tablet (6 mg total) by mouth one time only at 4 PM. 08/01/2019   Angiulli, Lavon Paganini, PA-C    Past Medical History: Past Medical History:  Diagnosis Date   Acute anterolateral wall MI (HCC)    CAD, multiple  vessel    Hypertension    Ischemic cardiomyopathy    Systolic heart failure ([place])    Tobacco abuse     Past Surgical History: Past Surgical History:  Procedure Laterality Date   CARDIOVERSION N/A 07/02/2019   Procedure: CARDIOVERSION;  Surgeon: Jolaine Artist, MD;  Location: [facility] OR;  Service: Cardiovascular;  Laterality: N/A;   CORONARY/GRAFT ACUTE MI REVASCULARIZATION N/A 06/17/2019   Procedure: Coronary/Graft Acute MI Revascularization;  Surgeon: Yolonda Kida, MD;  Location: [facility];  Service: Cardiovascular;  Laterality: N/A;   IABP INSERTION N/A 06/17/2019   Procedure: IABP Insertion;  Surgeon: Yolonda Kida, MD;  Location: [place] [facility];  Service: Cardiovascular;  Laterality: N/A;   IABP INSERTION N/A  06/24/2019   Procedure: IABP INSERTION;  Surgeon: Jolaine Artist, MD;  Location: [place] [facility];  Service: Cardiovascular;  Laterality: N/A;   IABP INSERTION N/A 07/09/2019   Procedure: IABP Insertion;  Surgeon: Jolaine Artist, MD;  Location: [place] [facility];  Service: Cardiovascular;  Laterality: N/A;   INSERTION OF IMPLANTABLE LEFT VENTRICULAR ASSIST DEVICE N/A 06/25/2019   Procedure: INSERTION OF IMPLANTABLE LEFT VENTRICULAR ASSIST DEVICE USING THORATEC HEARTMATE 3;  Surgeon: Wonda Olds, MD;  Location: [facility];  Service: Open Heart Surgery;  Laterality: N/A;   LEFT HEART CATH AND CORONARY ANGIOGRAPHY N/A 06/17/2019   Procedure: LEFT HEART CATH AND CORONARY ANGIOGRAPHY;  Surgeon: Yolonda Kida, MD;  Location: [place] [facility];  Service: Cardiovascular;  Laterality: N/A;   RIGHT HEART CATH N/A 06/17/2019   Procedure: RIGHT HEART CATH;  Surgeon: Yolonda Kida, MD;  Location: [place] [facility];  Service: Cardiovascular;  Laterality: N/A;   RIGHT HEART CATH N/A 06/24/2019   Procedure: RIGHT HEART CATH;  Surgeon: Jolaine Artist, MD;  Location: [place] [facility];  Service: Cardiovascular;  Laterality: N/A;   RIGHT HEART  CATH N/A 07/09/2019   Procedure: RIGHT HEART CATH;  Surgeon: Jolaine Artist, MD;  Location: [place] [facility];  Service: Cardiovascular;  Laterality: N/A;   TEE WITHOUT CARDIOVERSION N/A 06/25/2019   Procedure: TRANSESOPHAGEAL ECHOCARDIOGRAM (TEE);  Surgeon: Wonda Olds, MD;  Location: [facility];  Service: Open Heart Surgery;  Laterality: N/A;    Family History: Family History  Problem Relation Age of Onset   CAD Father    Valvular heart disease Father     Social History: Social History   Socioeconomic History   Marital status: Married    Spouse name: Not on file   Number of children: Not on file   Years of education: Not on file   Highest education level: Not on file  Occupational History   Not on file  Tobacco Use   Smoking status: Current Every Day Smoker    Packs/day: 1.50    Years: 30.00    Pack years: 45.00   Smokeless tobacco: Never Used  Substance and Sexual Activity   Alcohol use: Yes    Alcohol/week: 7.0 - 14.0 standard drinks    Types: 7 - 14 Standard drinks or equivalent per week   Drug use: Never   Sexual activity: Not on file  Other Topics Concern   Not on file  Social History Narrative   Not on file   Social Determinants of Health   Financial Resource Strain:    Difficulty of Paying Living Expenses:   Food Insecurity:    Worried About Charity fundraiser in the Last Year:    Arboriculturist in the Last Year:   Transportation Needs:    Film/video editor (Medical):    Lack of Transportation (Non-Medical):   Physical Activity:    Days of Exercise per Week:    Minutes of Exercise per Session:   Stress:    Feeling of Stress :   Social Connections:    Frequency of Communication with Friends and Family:    Frequency of Social Gatherings with Friends and Family:    Attends Religious Services:    Active Member of Clubs or Organizations:    Attends Archivist Meetings:    Marital Status:     Allergies:  Allergies  Allergen  Reactions   Codeine Hives  Objective:    Vital Signs:   Temp:  [98.2 F (36.8 C)-98.7 F (37.1 C)] 98.2 F (36.8 C) (07/27 0429) Pulse Rate:  [51-98] 89 (07/27 1435) Resp:  [16-26] 26 (07/27 1435) BP: (85-105)/(52-83) 105/83 (07/27 1226) SpO2:  [83 %-97 %] 83 % (07/27 1435) Weight:  [86 kg] 86 kg (07/27 0436) Last BM Date: 07/27/19 Filed Weights   07/25/19 0000 07/26/19 0651 07/06/2019 0436  Weight: 84.1 kg 86 kg 86 kg    Mean arterial Pressure 92   Physical Exam    General:  Appears apprehensive. Increased WOB. Mildly diaphoretic. HEENT: Normal Neck: supple. JVP elevated to jawline . Carotids 2+ bilat; no bruits. No lymphadenopathy or thyromegaly appreciated. Cor: Mechanical heart sounds with LVAD hum present. Lungs: Clear Abdomen: soft, nontender, nondistended. No hepatosplenomegaly. No bruits or masses. Good bowel sounds. Driveline: C/D/I; securement device intact and driveline incorporated Extremities: no cyanosis, clubbing, rash, edema Neuro: alert & orientedx3, cranial nerves grossly intact. moves all 4 extremities w/o difficulty. Affect pleasant   Telemetry   N/A   EKG   Ventricular fibrillation 210 bpm   Labs    Basic Metabolic Panel: Recent Labs  Lab 07/22/19 0420 07/22/19 0420 07/23/19 0243 07/23/19 0243 07/24/19 0302 07/24/19 0302 07/25/19 1514 07/26/19 0423 07/27/19 0400 07/22/2019 0405  NA 134*   < > 134*  --  132*  --  135  --  133* 134*  K 4.5   < > 4.2  --  4.0  --  4.4  --  4.1 4.3  CL 93*   < > 97*  --  98  --  101  --  101 101  CO2 30   < > 27  --  27  --  25  --  24 23  GLUCOSE 97   < > 105*  --  98  --  93  --  99 118*  BUN 18   < > 15  --  13  --  16  --  13 14  CREATININE 1.26*   < > 1.18  --  1.09  --  1.05  --  0.97 1.02  CALCIUM 9.3   < > 9.1   < > 8.7*   < > 8.6*  --  8.5* 8.7*  MG 1.9  --  2.0  --  2.0  --   --  1.8 2.1  --    < > = values in this interval not displayed.    Liver Function Tests: Recent Labs  Lab  07/27/19 0400  AST 67*  ALT 104*  ALKPHOS 117  BILITOT 1.0  PROT 6.0*  ALBUMIN 2.8*   No results for input(s): LIPASE, AMYLASE in the last 168 hours. No results for input(s): AMMONIA in the last 168 hours.  CBC: Recent Labs  Lab 07/22/19 0420 07/23/19 0243 07/24/19 0302 07/27/19 0400  WBC 8.5 8.9 8.9 8.6  NEUTROABS  --   --   --  6.3  HGB 12.1* 12.2* 11.8* 10.7*  HCT 39.2 39.3 38.0* 34.4*  MCV 93.6 93.6 93.8 95.6  PLT 268 285 259 215    Cardiac Enzymes: No results for input(s): CKTOTAL, CKMB, CKMBINDEX, TROPONINI in the last 168 hours.  BNP: BNP (last 3 results) Recent Labs    07/09/19 0313 07/16/19 0346 07/23/19 0243  BNP 1,815.2* 3,431.7* 1,811.9*    ProBNP (last 3 results) No results for input(s): PROBNP in the last 8760 hours.   CBG: Recent Labs  Lab 07/26/19  2105 07/27/19 0613 07/27/19 1202 07/27/19 2113 07/02/2019 0607  GLUCAP 109* 89 102* 138* 146*    Coagulation Studies: Recent Labs    07/25/19 1514 07/26/19 0423 07/27/19 0400 07/17/2019 0405  LABPROT 26.1* 22.0* 17.8* 16.8*  INR 2.5* 2.0* 1.5* 1.4*    Other results: EKG: VF 210 bpm   Imaging    No results found.    Assessment/Plan:     1. Ventricular Fibrillation  - Had 2 episodes of VF requiring emergent DC-CV on 07/02/19 - Recurrent VF on 7/2 and broke spontaneously.  - 7/3 Recurrent VF -> emergent DC-CV.  - 07/07/2019 VT/Torsades -> repeat DC-CV - 07/08/2019 VF/Torsades x2--> DC-CV x2.  - 7/27 Developed recurrent VF, rate 210 bpm ->>transferred back to 2H and underwent repeat DCCV. Lidocaine given (suspect VF is ischemically mediated)  - back in NSR. - start amio gtt. Load w/ 150 mg bolus x 1>>infussion 60 mg/hr - discuss w/ EP. Restart Mexiletine 200 mg bid - continue Ranexa 500 mg bid - Check STAT Mg. K stable at 4.4  - Keep K > 4.0 and Mg > 2.0.     2.  Acute Systolic Heart Failure s/p VAD - EF 66%, RV systolic function mildly reduced  - HMIII LVAD  Placed 6/24 - Has  had RV failure post VAD - Continue sildenafil & digoxin for RV support  - INR 1.4 today. LDH ok.  Coumadin per pharmacy   4. CAD - S/p Acute STEMI 6/21. Emergent cath showed severe multivessel disease w/ 75% mid- distal LM, 100% prox LAD, not amendable to PCI (unable to cross w/ guide wire, probable CTO), 100% prox LCx (probable CTO) and 100% ostial RCA (probable CTO). There were mild to moderate collaterals right to left to the LAD, minimal right to right collaterals an minimal left to left collaterals. LVEF 25% - Hs trop peaked 27,000. - No targets for CABG / PCI.   - S/P HMIII VAD - Continue ASA + statin   5. Renal mass = RCC - MRI abdomen- 1.8 cm right renal lesion, most consistent with renal cell carcinoma. No evidence of renal vein involvement or nodal metastasis. - Discussed w/ Urology and R RCC is small and will just require surveillance.  Ok to proceed with VAD from their standpoint.    6.  Sleep Apnea - frequent apnea at night  - order CPAP QHS  - needs formal sleep study     7. Debility - plan transfer back to CIR once more stable    I reviewed the LVAD parameters from today, and compared the results to the patient's prior recorded data.  No programming changes were made.  The LVAD is functioning within specified parameters.  The patient performs LVAD self-test daily.  LVAD interrogation was negative for any significant power changes, alarms or PI events/speed drops.  LVAD equipment check completed and is in good working order.  Back-up equipment present.   LVAD education done on emergency procedures and precautions and reviewed exit site care.  Length of Stay: [address] Hershal Coria 07/14/2019, 2:53 PM  VAD Team Pager [phone] (7am - 7am) +++VAD ISSUES ONLY+++   Advanced Heart Failure Team Pager [phone] (M-F; 7a - 4p)  Please contact [facility] for night-coverage after hours (4p -7a ) and weekends on [email or web address] for all non- LVAD Issues  Agree with above.     He developed recurrent VF with significant symptoms and drops in his LVAD flows. Moved emergently to 2H01   The  anesthesia team met [place] at the bedside and I performed emergent DC-CV with 200J back to NSR.   General: ill appearing SOB HEENT: normal  Neck: supple. JVP to ear  Carotids 2+ bilat; no bruits. No lymphadenopathy or thryomegaly appreciated. Cor: LVAD hum.  Lungs: Clear. Abdomen: obese soft, nontender, non-distended. No hepatosplenomegaly. No bruits or masses. Good bowel sounds. Driveline site clean. Anchor in place.  Extremities: no cyanosis, clubbing, rash. Warm trace dema  Neuro: alert & oriented x 3. No focal deficits. Moves all 4 without problem     Recurrent VF despite VAD support. Now s/p DC-CV   I spoke with Dr. Caryl Comes. Will reload IV amio. I gave him 100 mg lidocaine. Will restart mexilitene 200 bid and Ranexa 500 bid. Keep in ICU.     CRITICAL CARE Performed by: Glori Bickers   Total critical care time: 35 minutes   Critical care time was exclusive of separately billable procedures and treating other patients.   Critical care was necessary to treat or prevent imminent or life-threatening deterioration.   Critical care was time spent personally by me (independent of midlevel providers or residents) on the following activities: development of treatment plan with patient and/or surrogate as well as nursing, discussions with consultants, evaluation of patient's response to treatment, examination of patient, obtaining history from patient or surrogate, ordering and performing treatments and interventions, ordering and review of laboratory studies, ordering and review of radiographic studies, pulse oximetry and re-evaluation of patient's condition.   Glori Bickers, MD  5:19 PM

## 2019-07-28 NOTE — Transfer of Care (Signed)
Immediate Anesthesia Transfer of Care Note  Patient: Aaron [name]  Procedure(s) Performed: CARDIOVERSION (N/A )  Patient Location: ICU  Anesthesia Type:General  Level of Consciousness: drowsy  Airway & Oxygen Therapy: Patient Spontanous Breathing and Patient connected to face mask oxygen  Post-op Assessment: Report given to RN and Post -op Vital signs reviewed and stable  Post vital signs: Reviewed and stable  Last Vitals:  Vitals Value Taken Time  BP 91/35 07/26/2019 2255  Temp    Pulse 75 07/06/2019 2255  Resp 26 07/19/2019 2257  SpO2 92 % 07/20/2019 2255  Vitals shown include unvalidated device data.  Last Pain:  Vitals:   07/02/2019 2100  TempSrc: Oral  PainSc:          Complications: No complications documented.

## 2019-07-28 NOTE — Progress Notes (Signed)
Occupational Therapy Session Note  Patient Details  Name: Aaron [name] MRN: [id number] Date of Birth: [birth year]  Today's Date: 07/20/2019 OT Individual Time: 0930-1015 OT Individual Time Calculation (min): 45 min    Short Term Goals: Week 1:  OT Short Term Goal 1 (Week 1): STGs=LTGs due to ELOS  Skilled Therapeutic Interventions/Progress Updates:    Pt resting in bed upon arrival with 2L O2 via n/c. On questioning, pt stated he was a little SOB and nursing placed him on O2. O2 sats on 2L at 97%; on RA-94%. O2 removed for therapy. LVAD team visited during therapy session. Pt engaged in dynamic standing activities on Wii balance board. Pt SOB after each 60 min activity on balance board. Pt completed 3 sets of balance activity with focus on L and R weight shifts.  Pt with delayed weight shifts. Pt returned to room and transferred to bed with CGA. Pt independent with LVAD management during session.  Pt remained in bed with all needs within reach. Bed alarm activated. Pt on 2L O2.   Therapy Documentation Precautions:  Restrictions Weight Bearing Restrictions: No Pain:   Therapy/Group: Individual Therapy  Leroy Libman 07/18/2019, 10:25 AM

## 2019-07-28 NOTE — Progress Notes (Signed)
Physical Therapy Session Note  Patient Details  Name: Aaron [name] MRN: [id number] Date of Birth: 04-30-1965  Today's Date: 07/12/2019 PT Individual Time: 1330-1400 PT Individual Time Calculation (min): 30 min   Short Term Goals: Week 1:  PT Short Term Goal 1 (Week 1): STG=LTG due to ELOS Week 2:     Skilled Therapeutic Interventions/Progress Updates:   Pt received supine in bed and noted to have increased respiratory rate. PT assessed SpO2, found to be 81%. PT inc Oreased supplemental2 to 3L/min and SpO2 increased to 83%. RN and PA mad e aware pt's presentation and both present to evaluate pt at bedside. Pt reports feeling extremely fatigued and SOB as well as experiencing "dizzy" bouts, which presents as flashes of light for 3-5 minute increments with no change in physical activity precending. Pt left supine in bed with call bell in reach and all needs met.      Therapy Documentation Precautions:  Restrictions Weight Bearing Restrictions: No General: PT Amount of Missed Time (min): 30 Minutes PT Missed Treatment Reason: Patient ill (Comment) (low O2 and SOB) Vital Signs: Therapy Vitals Pulse Rate: 89 Resp: (!) 26 BP: 105/83 Patient Position (if appropriate): Lying Oxygen Therapy SpO2: (!) 83 % O2 Device: Nasal Cannula O2 Flow Rate (L/min): 3 L/min Pain: Pain Assessment Pain Scale: 0-10 Pain Score: 4  Pain Type: Surgical pain Pain Location: Chest Pain Descriptors / Indicators: Aching Pain Onset: Gradual Pain Intervention(s): Medication (See eMAR)    Therapy/Group: Individual Therapy  Lorie Phenix 07/23/2019, 2:36 PM

## 2019-07-28 NOTE — Anesthesia Procedure Notes (Signed)
Procedure Name: General with mask airway Date/Time: 07/10/2019 2:58 PM Performed by: Imagene Riches, CRNA Pre-anesthesia Checklist: Patient identified, Emergency Drugs available, Suction available, Patient being monitored and Timeout performed Patient Re-evaluated:Patient Re-evaluated prior to induction Oxygen Delivery Method: Ambu bag Preoxygenation: Pre-oxygenation with 100% oxygen

## 2019-07-29 ENCOUNTER — Other Ambulatory Visit: Payer: Self-pay

## 2019-07-29 ENCOUNTER — Inpatient Hospital Stay (HOSPITAL_COMMUNITY): Payer: BC Managed Care – PPO | Admitting: Registered Nurse

## 2019-07-29 ENCOUNTER — Encounter (HOSPITAL_COMMUNITY): Admission: AD | Disposition: E | Payer: Self-pay | Source: Ambulatory Visit | Attending: Internal Medicine

## 2019-07-29 ENCOUNTER — Inpatient Hospital Stay (HOSPITAL_COMMUNITY): Payer: BC Managed Care – PPO

## 2019-07-29 DIAGNOSIS — I5023 Acute on chronic systolic (congestive) heart failure: Secondary | ICD-10-CM

## 2019-07-29 DIAGNOSIS — E43 Unspecified severe protein-calorie malnutrition: Secondary | ICD-10-CM | POA: Insufficient documentation

## 2019-07-29 DIAGNOSIS — Z95811 Presence of heart assist device: Secondary | ICD-10-CM

## 2019-07-29 LAB — BASIC METABOLIC PANEL
Anion gap: 12 (ref 5–15)
BUN: 13 mg/dL (ref 6–20)
CO2: 18 mmol/L — ABNORMAL LOW (ref 22–32)
Calcium: 7.4 mg/dL — ABNORMAL LOW (ref 8.9–10.3)
Chloride: 95 mmol/L — ABNORMAL LOW (ref 98–111)
Creatinine, Ser: 0.95 mg/dL (ref 0.61–1.24)
GFR calc Af Amer: >60 mL/min (ref >60)
GFR calc non Af Amer: >60 mL/min (ref >60)
Glucose, Bld: 282 mg/dL — ABNORMAL HIGH (ref 70–99)
Potassium: 4.7 mmol/L (ref 3.5–5.1)
Sodium: 125 mmol/L — ABNORMAL LOW (ref 135–145)

## 2019-07-29 LAB — TYPE AND SCREEN
ABO/RH(D): O NEG
Antibody Screen: NEGATIVE

## 2019-07-29 LAB — URINALYSIS, MICROSCOPIC (REFLEX)
RBC / HPF: >50 RBC/hpf (ref 0–5)
WBC, UA: >50 WBC/hpf (ref 0–5)

## 2019-07-29 LAB — POCT I-STAT 7, (LYTES, BLD GAS, ICA,H+H)
Acid-Base Excess: 0 mmol/L (ref 0.0–2.0)
Bicarbonate: 24.6 mmol/L (ref 20.0–28.0)
Calcium, Ion: 1.11 mmol/L — ABNORMAL LOW (ref 1.15–1.40)
HCT: 32 % — ABNORMAL LOW (ref 39.0–52.0)
Hemoglobin: 10.9 g/dL — ABNORMAL LOW (ref 13.0–17.0)
O2 Saturation: 100 %
Patient temperature: 98.9
Potassium: 5.1 mmol/L (ref 3.5–5.1)
Sodium: 129 mmol/L — ABNORMAL LOW (ref 135–145)
TCO2: 26 mmol/L (ref 22–32)
pCO2 arterial: 37.5 mmHg (ref 32.0–48.0)
pH, Arterial: 7.425 (ref 7.350–7.450)
pO2, Arterial: 272 mmHg — ABNORMAL HIGH (ref 83.0–108.0)

## 2019-07-29 LAB — URINALYSIS, ROUTINE W REFLEX MICROSCOPIC

## 2019-07-29 LAB — LACTIC ACID, PLASMA
Lactic Acid, Venous: 1.5 mmol/L (ref 0.5–1.9)
Lactic Acid, Venous: 2.2 mmol/L (ref 0.5–1.9)

## 2019-07-29 LAB — PHOSPHORUS: Phosphorus: 4.5 mg/dL (ref 2.5–4.6)

## 2019-07-29 LAB — APTT: aPTT: 47 seconds — ABNORMAL HIGH (ref 24–36)

## 2019-07-29 LAB — GLUCOSE, CAPILLARY
Glucose-Capillary: 131 mg/dL — ABNORMAL HIGH (ref 70–99)
Glucose-Capillary: 133 mg/dL — ABNORMAL HIGH (ref 70–99)
Glucose-Capillary: 135 mg/dL — ABNORMAL HIGH (ref 70–99)
Glucose-Capillary: 137 mg/dL — ABNORMAL HIGH (ref 70–99)
Glucose-Capillary: 143 mg/dL — ABNORMAL HIGH (ref 70–99)
Glucose-Capillary: 144 mg/dL — ABNORMAL HIGH (ref 70–99)

## 2019-07-29 LAB — MAGNESIUM: Magnesium: 2.4 mg/dL (ref 1.7–2.4)

## 2019-07-29 LAB — LACTATE DEHYDROGENASE: LDH: 326 U/L — ABNORMAL HIGH (ref 98–192)

## 2019-07-29 LAB — CBC
HCT: 29.8 % — ABNORMAL LOW (ref 39.0–52.0)
Hemoglobin: 9.7 g/dL — ABNORMAL LOW (ref 13.0–17.0)
MCH: 30.8 pg (ref 26.0–34.0)
MCHC: 32.6 g/dL (ref 30.0–36.0)
MCV: 94.6 fL (ref 80.0–100.0)
Platelets: 160 10*3/uL (ref 150–400)
RBC: 3.15 MIL/uL — ABNORMAL LOW (ref 4.22–5.81)
RDW: 16.9 % — ABNORMAL HIGH (ref 11.5–15.5)
WBC: 14.1 10*3/uL — ABNORMAL HIGH (ref 4.0–10.5)
nRBC: 0 % (ref 0.0–0.2)

## 2019-07-29 LAB — COOXEMETRY PANEL
Carboxyhemoglobin: 2.1 % — ABNORMAL HIGH (ref 0.5–1.5)
Methemoglobin: 1.6 % — ABNORMAL HIGH (ref 0.0–1.5)
O2 Saturation: 70.7 %
Total hemoglobin: 9.7 g/dL — ABNORMAL LOW (ref 12.0–16.0)

## 2019-07-29 LAB — TRIGLYCERIDES: Triglycerides: 141 mg/dL (ref <150)

## 2019-07-29 LAB — LIDOCAINE LEVEL
Lidocaine Lvl: >30 ug/mL — ABNORMAL HIGH (ref 1.5–5.0)
Lidocaine Lvl: 4.9 ug/mL (ref 1.5–5.0)

## 2019-07-29 LAB — PROTIME-INR
INR: 2.1 — ABNORMAL HIGH (ref 0.8–1.2)
Prothrombin Time: 22.6 seconds — ABNORMAL HIGH (ref 11.4–15.2)

## 2019-07-29 SURGERY — IABP INSERTION
Anesthesia: LOCAL

## 2019-07-29 MED ORDER — WARFARIN SODIUM 2 MG PO TABS
2.0000 mg | ORAL_TABLET | Freq: Once | ORAL | Status: AC
  Administered 2019-07-29: 2 mg
  Filled 2019-07-29: qty 1

## 2019-07-29 MED ORDER — ADULT MULTIVITAMIN W/MINERALS CH
1.0000 | ORAL_TABLET | Freq: Every day | ORAL | Status: DC
  Administered 2019-07-29 – 2019-07-30 (×2): 1
  Filled 2019-07-29 (×2): qty 1

## 2019-07-29 MED ORDER — PROSOURCE TF PO LIQD
90.0000 mL | Freq: Three times a day (TID) | ORAL | Status: DC
  Administered 2019-07-29 – 2019-08-05 (×21): 90 mL
  Filled 2019-07-29 (×22): qty 90

## 2019-07-29 MED ORDER — PHENYLEPHRINE 40 MCG/ML (10ML) SYRINGE FOR IV PUSH (FOR BLOOD PRESSURE SUPPORT)
PREFILLED_SYRINGE | INTRAVENOUS | Status: AC
  Administered 2019-07-29: 400 ug
  Filled 2019-07-29: qty 10

## 2019-07-29 MED ORDER — ATROPINE SULFATE 1 MG/10ML IJ SOSY
PREFILLED_SYRINGE | INTRAMUSCULAR | Status: AC
  Filled 2019-07-29: qty 10

## 2019-07-29 MED ORDER — THIAMINE HCL 100 MG PO TABS
100.0000 mg | ORAL_TABLET | Freq: Every day | ORAL | Status: DC
  Administered 2019-07-29 – 2019-08-19 (×22): 100 mg
  Filled 2019-07-29 (×22): qty 1

## 2019-07-29 MED ORDER — PROPOFOL 10 MG/ML IV BOLUS
INTRAVENOUS | Status: DC | PRN
  Administered 2019-07-29: 50 mg via INTRAVENOUS

## 2019-07-29 MED ORDER — CALCIUM CHLORIDE 10 % IV SOLN
INTRAVENOUS | Status: AC
  Administered 2019-07-29: 1 g via INTRAVENOUS
  Filled 2019-07-29: qty 10

## 2019-07-29 MED ORDER — PROPOFOL 1000 MG/100ML IV EMUL
INTRAVENOUS | Status: AC
  Administered 2019-07-29: 20 ug/kg/min via INTRAVENOUS
  Filled 2019-07-29: qty 100

## 2019-07-29 MED ORDER — VANCOMYCIN HCL 1250 MG/250ML IV SOLN
1250.0000 mg | Freq: Two times a day (BID) | INTRAVENOUS | Status: DC
  Administered 2019-07-29 – 2019-07-30 (×2): 1250 mg via INTRAVENOUS
  Filled 2019-07-29 (×3): qty 250

## 2019-07-29 MED ORDER — ORAL CARE MOUTH RINSE
15.0000 mL | OROMUCOSAL | Status: DC
  Administered 2019-07-29 – 2019-07-30 (×12): 15 mL via OROMUCOSAL

## 2019-07-29 MED ORDER — LIDOCAINE HCL (CARDIAC) PF 100 MG/5ML IV SOSY: 100.0000 mg | PREFILLED_SYRINGE | Freq: Once | INTRAVENOUS | Status: AC

## 2019-07-29 MED ORDER — LIDOCAINE HCL (PF) 1 % IJ SOLN
INTRAMUSCULAR | Status: AC
  Filled 2019-07-29: qty 30

## 2019-07-29 MED ORDER — SODIUM CHLORIDE 0.9 % IV SOLN: INTRAVENOUS | Status: DC | PRN

## 2019-07-29 MED ORDER — MAGNESIUM SULFATE 2 GM/50ML IV SOLN
INTRAVENOUS | Status: AC
  Administered 2019-07-29: 2 g via INTRAVENOUS
  Filled 2019-07-29: qty 50

## 2019-07-29 MED ORDER — ASPIRIN 81 MG PO CHEW
81.0000 mg | CHEWABLE_TABLET | Freq: Every day | ORAL | Status: DC
  Administered 2019-07-29 – 2019-08-19 (×22): 81 mg
  Filled 2019-07-29 (×22): qty 1

## 2019-07-29 MED ORDER — SODIUM CHLORIDE 0.9 % IV SOLN
INTRAVENOUS | Status: DC | PRN
  Administered 2019-08-02: 1000 mL via INTRAVENOUS

## 2019-07-29 MED ORDER — FUROSEMIDE 10 MG/ML IJ SOLN
40.0000 mg | Freq: Once | INTRAMUSCULAR | Status: AC
  Administered 2019-07-29: 40 mg via INTRAVENOUS
  Filled 2019-07-29: qty 4

## 2019-07-29 MED ORDER — HEPARIN (PORCINE) IN NACL 1000-0.9 UT/500ML-% IV SOLN
INTRAVENOUS | Status: AC
  Filled 2019-07-29: qty 1000

## 2019-07-29 MED ORDER — PANTOPRAZOLE SODIUM 40 MG PO PACK
40.0000 mg | PACK | Freq: Every day | ORAL | Status: DC
  Administered 2019-07-29 – 2019-08-09 (×12): 40 mg
  Filled 2019-07-29 (×12): qty 20

## 2019-07-29 MED ORDER — PROPOFOL 1000 MG/100ML IV EMUL
5.0000 ug/kg/min | INTRAVENOUS | Status: DC
  Administered 2019-07-29: 80 ug/kg/min via INTRAVENOUS
  Administered 2019-07-29: 70 ug/kg/min via INTRAVENOUS
  Administered 2019-07-29: 50 ug/kg/min via INTRAVENOUS
  Administered 2019-07-29: 60 ug/kg/min via INTRAVENOUS
  Administered 2019-07-29: 70 ug/kg/min via INTRAVENOUS
  Administered 2019-07-29: 80 ug/kg/min via INTRAVENOUS
  Administered 2019-07-29: 75 ug/kg/min via INTRAVENOUS
  Administered 2019-07-29: 80 ug/kg/min via INTRAVENOUS
  Administered 2019-07-30 (×3): 65 ug/kg/min via INTRAVENOUS
  Filled 2019-07-29 (×2): qty 200
  Filled 2019-07-29 (×5): qty 100
  Filled 2019-07-29: qty 200

## 2019-07-29 MED ORDER — DIGOXIN 125 MCG PO TABS
0.1250 mg | ORAL_TABLET | Freq: Every day | ORAL | Status: DC
  Administered 2019-07-31 – 2019-08-03 (×4): 0.125 mg
  Filled 2019-07-29 (×6): qty 1

## 2019-07-29 MED ORDER — GABAPENTIN 250 MG/5ML PO SOLN
300.0000 mg | Freq: Three times a day (TID) | ORAL | Status: DC
  Administered 2019-07-29 – 2019-08-19 (×64): 300 mg
  Filled 2019-07-29 (×73): qty 6

## 2019-07-29 MED ORDER — CLONAZEPAM 0.5 MG PO TABS
0.5000 mg | ORAL_TABLET | Freq: Two times a day (BID) | ORAL | Status: DC
  Administered 2019-07-29 – 2019-08-04 (×13): 0.5 mg
  Filled 2019-07-29 (×15): qty 1

## 2019-07-29 MED ORDER — SILDENAFIL CITRATE 20 MG PO TABS
20.0000 mg | ORAL_TABLET | Freq: Three times a day (TID) | ORAL | Status: DC
  Administered 2019-07-29 – 2019-08-19 (×66): 20 mg
  Filled 2019-07-29 (×67): qty 1

## 2019-07-29 MED ORDER — PHENYLEPHRINE HCL (PRESSORS) 10 MG/ML IV SOLN
INTRAVENOUS | Status: DC | PRN
  Administered 2019-07-29: 120 ug via INTRAVENOUS

## 2019-07-29 MED ORDER — VASOPRESSIN 20 UNITS/100 ML INFUSION FOR SHOCK
0.0000 [IU]/min | INTRAVENOUS | Status: DC
  Administered 2019-07-29: 0.03 [IU]/min via INTRAVENOUS
  Administered 2019-07-29: 0.02 [IU]/min via INTRAVENOUS
  Administered 2019-07-29: 0.03 [IU]/min via INTRAVENOUS
  Filled 2019-07-29 (×5): qty 100

## 2019-07-29 MED ORDER — SODIUM BICARBONATE 8.4 % IV SOLN: 50.0000 meq | Freq: Once | INTRAVENOUS | Status: AC

## 2019-07-29 MED ORDER — LIDOCAINE HCL (CARDIAC) PF 100 MG/5ML IV SOSY
PREFILLED_SYRINGE | INTRAVENOUS | Status: AC
  Administered 2019-07-29: 100 mg via INTRAVENOUS
  Filled 2019-07-29: qty 5

## 2019-07-29 MED ORDER — FOLIC ACID 1 MG PO TABS
1.0000 mg | ORAL_TABLET | Freq: Every day | ORAL | Status: DC
  Administered 2019-07-29 – 2019-08-19 (×22): 1 mg
  Filled 2019-07-29 (×22): qty 1

## 2019-07-29 MED ORDER — EPINEPHRINE 1 MG/10ML IJ SOSY
PREFILLED_SYRINGE | INTRAMUSCULAR | Status: AC
  Filled 2019-07-29: qty 20

## 2019-07-29 MED ORDER — SODIUM CHLORIDE 0.9% FLUSH
10.0000 mL | Freq: Two times a day (BID) | INTRAVENOUS | Status: DC
  Administered 2019-07-29 (×2): 10 mL
  Administered 2019-07-30: 40 mL
  Administered 2019-07-30 – 2019-08-03 (×7): 10 mL
  Administered 2019-08-03: 20 mL
  Administered 2019-08-04 – 2019-08-15 (×13): 10 mL
  Administered 2019-08-15: 40 mL
  Administered 2019-08-16: 10 mL
  Administered 2019-08-16: 40 mL
  Administered 2019-08-17 – 2019-08-19 (×4): 10 mL

## 2019-07-29 MED ORDER — VITAL AF 1.2 CAL PO LIQD
1000.0000 mL | ORAL | Status: DC
  Administered 2019-07-29 – 2019-08-05 (×7): 1000 mL

## 2019-07-29 MED ORDER — PHENYLEPHRINE HCL (PRESSORS) 10 MG/ML IV SOLN
INTRAVENOUS | Status: DC | PRN
  Administered 2019-07-29 (×4): 120 ug via INTRAVENOUS

## 2019-07-29 MED ORDER — FLUMAZENIL 0.5 MG/5ML IV SOLN
INTRAVENOUS | Status: AC
  Administered 2019-07-29: 0.2 mg
  Filled 2019-07-29: qty 5

## 2019-07-29 MED ORDER — SODIUM BICARBONATE 8.4 % IV SOLN
INTRAVENOUS | Status: AC
  Administered 2019-07-29: 50 meq via INTRAVENOUS
  Filled 2019-07-29: qty 50

## 2019-07-29 MED ORDER — ATORVASTATIN CALCIUM 80 MG PO TABS
80.0000 mg | ORAL_TABLET | Freq: Every day | ORAL | Status: DC
  Administered 2019-07-29 – 2019-08-19 (×22): 80 mg
  Filled 2019-07-29 (×22): qty 1

## 2019-07-29 MED ORDER — PHENYLEPHRINE HCL-NACL 20-0.9 MG/250ML-% IV SOLN
0.0000 ug/min | INTRAVENOUS | Status: DC
  Filled 2019-07-29: qty 250

## 2019-07-29 MED ORDER — SODIUM CHLORIDE 0.9% FLUSH: 10.0000 mL | INTRAVENOUS | Status: DC | PRN

## 2019-07-29 MED ORDER — MAGNESIUM SULFATE 2 GM/50ML IV SOLN: 2.0000 g | Freq: Once | INTRAVENOUS | Status: AC

## 2019-07-29 MED ORDER — SUCCINYLCHOLINE CHLORIDE 20 MG/ML IJ SOLN
INTRAMUSCULAR | Status: DC | PRN
  Administered 2019-07-29: 100 mg via INTRAVENOUS

## 2019-07-29 MED ORDER — CALCIUM CHLORIDE 10 % IV SOLN: 1.0000 g | Freq: Once | INTRAVENOUS | Status: AC

## 2019-07-29 MED ORDER — CHLORHEXIDINE GLUCONATE 0.12% ORAL RINSE (MEDLINE KIT)
15.0000 mL | Freq: Two times a day (BID) | OROMUCOSAL | Status: DC
  Administered 2019-07-29 – 2019-08-07 (×19): 15 mL via OROMUCOSAL

## 2019-07-29 MED ORDER — PHENYLEPHRINE CONCENTRATED 100MG/250ML (0.4 MG/ML) INFUSION SIMPLE
0.0000 ug/min | INTRAVENOUS | Status: DC
  Administered 2019-07-29: 100 ug/min via INTRAVENOUS
  Filled 2019-07-29 (×3): qty 250

## 2019-07-29 MED ORDER — SODIUM CHLORIDE 0.9 % IV SOLN
2.0000 g | Freq: Three times a day (TID) | INTRAVENOUS | Status: DC
  Administered 2019-07-29 – 2019-08-02 (×12): 2 g via INTRAVENOUS
  Filled 2019-07-29 (×14): qty 2

## 2019-07-29 MED FILL — Sodium Chloride IV Soln 0.9%: INTRAVENOUS | Qty: 100 | Status: AC

## 2019-07-29 MED FILL — Vasopressin IV Soln 20 Unit/ML (For IV Infusion): INTRAVENOUS | Qty: 1 | Status: AC

## 2019-07-29 NOTE — Progress Notes (Addendum)
Cardiology and anesthesia at bedside for repeat cardioversion. Pt sedated with propofol gtt- see MAR for dosages. DCCV 200J at 0303 with conversion back to juntional in the 50's. Pt converted back in VT/VF at 0307 and was cardioverted at bedside with cardiology at [place]. At 0320 patient back into VF. Cardioverted with cardiology at bedside with 50J at 0324 back into NSR.   VF recurred at 0419, at 0429 shocked with 50J- unsuccessful cardioversion. 0430 shocked with 200J- successful cardioversion to NSR. Pt shocked again at 0454 and finally 0513 with successful cardioversion to SB 50-60s. Dr Haroldine Laws paged cath lab to place IABP.   Pt was cardioverted a total of 10 times between arrival to [facility] and this morning.   Dr. Haroldine Laws at bedside around [phone] and decided to hold IABP placement because pt has remained in a sinus rhythm since last DCCV. Reported off to dayshift RN and replayed POC.

## 2019-07-29 NOTE — Progress Notes (Signed)
LVAD Coordinator Rounding Note:  Transferred from [facility] ED on 06/17/19 with STEMI. Cath revealed multi-vessel CAD, wire would not cross to open LAD; IABP placed and pt transferred here for evaluation for CABG.   Dr. Orvan Seen evaluated and found he was not amenable to surgical or percutaneous revascularization due to poor targets. With severe depressed LV function and little coronary perfusion, LVAD was recommended.   HM III LVAD implanted on 06/25/19 by Dr. Orvan Seen under Destination Therapy criteria due to recent smoking history.  Developed sepsis/PNA with respiratory failure. Had refractory VT/VF. IABP/swan placed 07/09/19 - discontinued 07/14/19  Pt's transferred to CIR on 07/24/19. Had refractory VF 07/02/2019 and transferred back to [place].  Had refractory VF throughout the night with ~10 shocks overnight. Now intubated.   Vital signs: Temp: 98.8 HR: 53   Art. Line:  78/66 (72) Doppler:  Not done O2 Sat: 97% on 60% FiO2  Wt:207>225.7>222.6>214.3.....193.7>187.3>184.3>184.3>184.3>183.4>189.6 lbs  LVAD parameters: Speed:  5200 Flow: 4.2 Power:  3.6w PI: 3.6 Hct: 32  Alarms: none Events: none   Fixed speed: 5200 Low speed limit: 4900   Drive Line:  CDI. Anchor secure.  Every other day dressing changes per bedside nurse. Next dressing change due: 07/30/19.    Labs:  LDH trend: 660>398........375>338>320>317>257>257>244>256>233>194>205  INR trend: 1.4>1.3.....1.5>1.3>1.3>1.4>1.8>2.5>3.2>1.5>1.4  Anticoagulation Plan: -INR Goal:  2.0 - 2.5 -ASA Dose: 81 mg - will keep going for CAD for 3 mths stop date 09/24/25  Blood Products:  -Intra-op - 06/25/19>>6 units FFP  - 07/09/19>>2 FFP - 07/10/19>>2 FFP  Device: N/A  Drips: None  Infection: - 07/09/19 - BCs>>negative - 07/09/19 - resp culture>>negative - 07/21/19 - DL wound culture>>negative  Arrythmias: was on Amiodarone pre implant for NSVT - 2 episodes of VF requiring emergent DC-CV on 07/02/19 - Recurrent VF on 7/2 and  broke spontaneously.  - 7/3 Recurrent VF at 2a.-> emergent DC-CV.  - 07/07/2019 VT/Torsades -> repeat DC_CV - 07/08/2019 VF/Torsades x2--> DC-CV x2.  - 07/09/19 Multiple episodes of VT/VF -> IABP placed. AV pacing via external pacing wires - underlying rhythm bradycardia 40 bpm; external AV pacing wires remain intact  Respiratory: extubated 06/26/19 - re-intubated 07/09/19 for sepsis/pneumoni - extubated 07/14/19 - re-intubated 07/20/2019 for refractory VF  Nitric Oxide: off 06/26/19  - Restarted 07/10/19 - Off 07/14/19  Gtts: Propofol 80 mcg/kg/min Neo 90 mg/min Vasopressin 0.03 u/min Amio 60 mg/hr Lidocaine 2 mg/min  VAD Education: 1. Sister-Joanne is checked off to perform driveline dressing changes independently.  2. Discharge education completed 07/21/19 with patient, [name], and Otila Kluver.    Plan/Recommendations:  1. Call VAD pager if any VAD equipment or drive line issues. 2. Every other day dressing changes per bedside nurse  Tanda Rockers RN [place] Coordinator  Office: [phone]  24/7 Pager: [phone]

## 2019-07-29 NOTE — Transfer of Care (Signed)
Immediate Anesthesia Transfer of Care Note  Patient: Newell Wafer Hanback  Procedure(s) Performed: AN AD [place] INTUBATION  Patient Location: ICU  Anesthesia Type:General  Level of Consciousness: Patient remains intubated per anesthesia plan  Airway & Oxygen Therapy: Patient remains intubated per anesthesia plan and Patient placed on Ventilator (see vital sign flow sheet for setting)  Post-op Assessment: Report given to RN and Post -op Vital signs reviewed and stable  Post vital signs: Reviewed and stable  Last Vitals:  Vitals Value Taken Time  BP 81/47 07/28/2019 0316  Temp    Pulse 30 07/26/2019 0317  Resp 26 07/16/2019 0317  SpO2 91 % 07/15/2019 0317  Vitals shown include unvalidated device data.  Last Pain:  Vitals:   08/01/2019 0000  TempSrc:   PainSc: Asleep     Intubation per cardiology plans for rhythm management/persistent need for cardioversion. Fitzgerald MD at bedside for induction, Tiffany RT applied to ventilator at 0300, transfer of care back to ICU team.    Complications: No complications documented.

## 2019-07-29 NOTE — Progress Notes (Signed)
Pharmacy Antibiotic Note  Aaron [name] is a 54 y.o. male admitted on 07/03/2019 with cardiogenic shock now s/p LVAD placement. Pt decompensated in CIR with recurrent VFib and was also having SOB and fevers. Pharmacy has been consulted for vancomycin and cefepime dosing for infection of unknown etiology. Pt previously completed a course of vancomycin and cefepime for possible sepsis.  Plan: -Cefepime 2g IV q8h -Vancomycin 1240m IV q12h -Follow Cr, cultures, LOT -Vancomycin levels prn   Height: _0  (175.3 cm) Weight: 86 kg (189 lb 9.5 oz) IBW/kg (Calculated) : 70.7  Temp (24hrs), Avg:99.1 F (37.3 C), Min:97.7 F (36.5 C), Max:100.4 F (38 C)  Recent Labs  Lab 07/23/19 0243 07/23/19 0243 07/24/19 0302 07/24/19 0302 07/25/19 1514 07/27/19 0400 07/02/2019 0405 07/04/2019 2222 07/10/2019 0325 07/20/2019 0329 07/02/2019 0705  WBC 8.9  --  8.9  --   --  8.6  --   --  14.1*  --   --   CREATININE 1.18   < > 1.09   < > 1.05 0.97 1.02 1.04 0.95  --   --   LATICACIDVEN  --   --   --   --   --   --   --   --   --  1.5 2.2*   < > = values in this interval not displayed.    Estimated Creatinine Clearance: 95.4 mL/min (by C-G formula based on SCr of 0.95 mg/dL).    Allergies  Allergen Reactions  . Codeine Hives    Antimicrobials: Doxycycline 6/30 >> 7/8 Vancomycin 7/8 >> 7/11; 7/28 >> Cefepime 7/8 >> 7/14; 7/28 >>  Microbiology results: 7/8 BCx: negative 7/8 BAL: normal flora 7/20 Abdomen Cx: negative 7/27 MRSA PCR: negative 7/28 UCx: sent 7/28 BCx: sent   MArrie Senate PharmD, BCPS Clinical Pharmacist [phone] check AMION for all MBondurantnumbers 07/08/2019

## 2019-07-29 NOTE — Consult Note (Addendum)
Cardiology Consultation:   Patient ID: Aaron [name] MRN: [id number]; DOB: 08-Mar-1965  Admit date: 07/18/2019 Date of Consult: 08/01/2019  Primary Care Provider: Patient, No Pcp Per [facility] Cardiologist: Dr. Marlou Porch AHF: Dr. Burns [place] [facility] Electrophysiologist:  New to EP last admission, Dr. Blima Dessert   Patient Profile:   Aaron [name] is a 54 y.o. male w/PMHx of CAD (prior CABG), HTN, ICM     History of Present Illness:   Aaron [name] presented 06/18/19 with acute anterolateral STEMI. [name] underwent emergent cath and was found to have multivessel CAD. PCI of the LAD was unsuccessful (there were no targets for CABG). [name] required IABP and subsequent VAD for cardiogenic shock. - HM3 placement 6/24  - Had 2 episodes of VF requiring emergent DC-CV on 07/02/19 - Recurrent VF on 7/2 and broke spontaneously.  - 7/3 Recurrent VF at 2a.->emergent DC-CV.  - 07/07/2019 VT/Torsades -> repeat DC_CV - 07/08/2019 VF/Torsades x2-->DC-CV x2.  - 07/09/19 developed sepsis/PNA -> intubated - 07/09/19 Multiple episodes of VT/VF -> IABP placed. AV pacing via EPW [name] did eventually improve, transitioned to PO AAD with amiodarone aand mexiletine. IABP was removed, HR improved and EPW removed, ultimately mexiletine was stopped, amio continued and able to be discharged to [facility] 07/21/2019 Notes report not a transplant candidate given smoker.  Yesterday [name] developed acute increase in DOE, was found in VF and readmitted to ICU and had emergent cardioversion, placed on amiodarone gtt and PO mexiletine. Last night developed refractory VF requiring total of 10 shocks and has been placed on lidocaine gtt, and intubated and on pressor support  LABS NA 129 > 125 > 129 K+ 4.6 > 5.1 BUN/Creat 13/1.04 > 13/0.95 WBC 14.1 H?H 10.9/32 Plts 160  INR 2.1    gtts Amiodarone at 24m/hr Lidocaine 255mmin Neo vaso   Lidocaine level done at 0325 today was >30 Lido started at 2248 on 5069mbolus, ran at 1mg53mtil 0500 and increased to 2 Lido-level I suspect may have been drawn from that IV site and is inaccurate. Repeat is pending (d/w pharmacy to continue and wait until repeat is back)   Past Medical History:  Diagnosis Date  . Acute anterolateral wall MI (HCC)[place]. CAD, multiple vessel   . Hypertension   . Ischemic cardiomyopathy   . Systolic heart failure (HCC)[place]. Tobacco abuse     Past Surgical History:  Procedure Laterality Date  . CARDIOVERSION N/A 07/02/2019   Procedure: CARDIOVERSION;  Surgeon: BensJolaine Artist;  [facility]  Service: Cardiovascular;  Laterality: N/A;  . CORONARY/GRAFT ACUTE MI REVASCULARIZATION N/A 06/17/2019   Procedure: Coronary/Graft Acute MI Revascularization;  Surgeon: CallYolonda Kida;  [place] [facility];  Service: Cardiovascular;  Laterality: N/A;  . IABP INSERTION N/A 06/17/2019   Procedure: IABP Insertion;  Surgeon: CallYolonda Kida;  [place];  Service: Cardiovascular;  Laterality: N/A;  . IABP INSERTION N/A 06/24/2019   Procedure: IABP INSERTION;  Surgeon: BensJolaine Artist;  [facility];  Service: Cardiovascular;  Laterality: N/A;  . IABP INSERTION N/A 07/09/2019   Procedure: IABP Insertion;  Surgeon: BensJolaine Artist;  [facility] [facility];  Service: Cardiovascular;  Laterality: N/A;  . INSERTION OF IMPLANTABLE LEFT VENTRICULAR ASSIST DEVICE N/A 06/25/2019   Procedure: INSERTION OF IMPLANTABLE LEFT VENTRICULAR ASSIST DEVICE USING THORATEC HEARTMATE 3;  Surgeon: AtkiWonda Olds;  [facility]  Service: Open Heart Surgery;  Laterality: N/A;  . LEFT HEART CATH AND CORONARY ANGIOGRAPHY N/A 06/17/2019   Procedure: LEFT HEART CATH AND CORONARY ANGIOGRAPHY;  Surgeon: Yolonda Kida, MD;  Location: [place] [facility];  Service: Cardiovascular;  Laterality: N/A;  . RIGHT HEART CATH N/A 06/17/2019   Procedure: RIGHT HEART CATH;  Surgeon:  Yolonda Kida, MD;  Location: [place] [facility];  Service: Cardiovascular;  Laterality: N/A;  . RIGHT HEART CATH N/A 06/24/2019   Procedure: RIGHT HEART CATH;  Surgeon: Jolaine Artist, MD;  Location: [place] [facility];  Service: Cardiovascular;  Laterality: N/A;  . RIGHT HEART CATH N/A 07/09/2019   Procedure: RIGHT HEART CATH;  Surgeon: Jolaine Artist, MD;  Location: [place] [facility];  Service: Cardiovascular;  Laterality: N/A;  . TEE WITHOUT CARDIOVERSION N/A 06/25/2019   Procedure: TRANSESOPHAGEAL ECHOCARDIOGRAM (TEE);  Surgeon: Wonda Olds, MD;  Location: [place];  Service: Open Heart Surgery;  Laterality: N/A;     Home Medications:  Prior to Admission medications   Medication Sig Start Date End Date Taking? Authorizing Provider  acetaminophen (TYLENOL) 325 MG tablet Take 2 tablets (650 mg total) by mouth every 4 (four) hours as needed for headache or mild pain. 07/20/2019   Angiulli, Lavon Paganini, PA-C  ALPRAZolam Duanne Moron) 0.25 MG tablet Take 1 tablet (0.25 mg total) by mouth 3 (three) times daily as needed for anxiety. 07/13/2019   Angiulli, Lavon Paganini, PA-C  amiodarone (PACERONE) 400 MG tablet Take 1 tablet (400 mg total) by mouth 2 (two) times daily. 07/23/19   Consuelo Pandy, PA-C  aspirin 81 MG chewable tablet Chew 1 tablet (81 mg total) by mouth daily. 07/23/19   Lyda Jester M, PA-C  atorvastatin (LIPITOR) 80 MG tablet Take 1 tablet (80 mg total) by mouth daily. 07/23/19   Lyda Jester M, PA-C  clonazePAM (KLONOPIN) 0.5 MG tablet Take 1 tablet (0.5 mg total) by mouth 2 (two) times daily. 07/12/2019   Angiulli, Lavon Paganini, PA-C  digoxin (LANOXIN) 0.125 MG tablet Take 1 tablet (0.125 mg total) by mouth daily. 07/23/19   Lyda Jester M, PA-C  folic acid (FOLVITE) 1 MG tablet Take 1 tablet (1 mg total) by mouth daily. 07/23/19   Lyda Jester M, PA-C  gabapentin (NEURONTIN) 600 MG tablet Take 0.5 tablets (300 mg total) by mouth 3 (three) times daily. 07/23/19    Lyda Jester M, PA-C  melatonin 5 MG TABS Take 1 tablet (5 mg total) by mouth daily after supper. 07/15/2019   Angiulli, Lavon Paganini, PA-C  Multiple Vitamin (MULTIVITAMIN WITH MINERALS) TABS tablet Take 1 tablet by mouth daily. 07/18/2019   Angiulli, Lavon Paganini, PA-C  oxyCODONE (OXY IR/ROXICODONE) 5 MG immediate release tablet Take 1-2 tablets (5-10 mg total) by mouth every 3 (three) hours as needed for severe pain. 07/18/2019   Angiulli, Lavon Paganini, PA-C  pantoprazole (PROTONIX) 40 MG tablet Take 1 tablet (40 mg total) by mouth daily. 07/23/19   Lyda Jester M, PA-C  polyvinyl alcohol (LIQUIFILM TEARS) 1.4 % ophthalmic solution Place 1 drop into both eyes as needed for dry eyes. 07/10/2019   Angiulli, Lavon Paganini, PA-C  ranolazine (RANEXA) 500 MG 12 hr tablet Take 1 tablet (500 mg total) by mouth 2 (two) times daily. 07/23/19   Lyda Jester M, PA-C  sildenafil (REVATIO) 20 MG tablet Take 1 tablet (20 mg total) by mouth 3 (three) times daily. 07/24/19   Lyda Jester M, PA-C  thiamine 100 MG tablet  Take 1 tablet (100 mg total) by mouth daily. 07/23/19   Robbie Lis M, PA-C  traMADol (ULTRAM) 50 MG tablet Take 1-2 tablets (50-100 mg total) by mouth every 4 (four) hours as needed for moderate pain. 07/13/2019   Angiulli, Mcarthur Rossetti, PA-C  traZODone (DESYREL) 100 MG tablet Take 1 tablet (100 mg total) by mouth at bedtime as needed for sleep. 07/09/2019   Angiulli, Mcarthur Rossetti, PA-C  warfarin (COUMADIN) 6 MG tablet Take 1 tablet (6 mg total) by mouth one time only at 4 PM. 07/18/2019   Angiulli, Mcarthur Rossetti, PA-C    Inpatient Medications: Scheduled Meds: . aspirin EC  81 mg Oral Daily  . atorvastatin  80 mg Oral Daily  . atropine      . chlorhexidine gluconate (MEDLINE KIT)  15 mL Mouth Rinse BID  . Chlorhexidine Gluconate Cloth  6 each Topical Daily  . clonazePAM  0.5 mg Oral BID  . digoxin  0.125 mg Oral Daily  . folic acid  1 mg Oral Daily  . gabapentin  300 mg Oral TID  . lidocaine (cardiac) 100 mg/56mL       . mouth rinse  15 mL Mouth Rinse 10 times per day  . melatonin  5 mg Oral QHS  . mexiletine  200 mg Oral Q12H  . multivitamin with minerals  1 tablet Oral Daily  . pantoprazole  40 mg Oral Daily  . ranolazine  500 mg Oral BID  . sildenafil  20 mg Oral TID  . sodium chloride flush  10-40 mL Intracatheter Q12H  . thiamine  100 mg Oral Daily  . Warfarin - Pharmacist Dosing Inpatient   Does not apply q1600   Continuous Infusions: . sodium chloride    . sodium chloride 10 mL/hr at 07/07/2019 0700  . amiodarone 60 mg/hr (07/17/2019 0700)  . lidocaine 2 mg/min (07/23/2019 0700)  . phenylephrine (NEO-SYNEPHRINE) Adult infusion 80 mcg/min (07/28/2019 0700)  . propofol (DIPRIVAN) infusion 80 mcg/kg/min (07/22/2019 0744)  . vasopressin 0.03 Units/min (07/09/2019 0700)   PRN Meds: Place/Maintain arterial line **AND** sodium chloride, sodium chloride, oxyCODONE, sodium chloride flush, traMADol  Allergies:    Allergies  Allergen Reactions  . Codeine Hives    Social History:   Social History   Socioeconomic History  . Marital status: Married    Spouse name: Not on file  . Number of children: Not on file  . Years of education: Not on file  . Highest education level: Not on file  Occupational History  . Not on file  Tobacco Use  . Smoking status: Current Every Day Smoker    Packs/day: 1.50    Years: 30.00    Pack years: 45.00  . Smokeless tobacco: Never Used  Substance and Sexual Activity  . Alcohol use: Yes    Alcohol/week: 7.0 - 14.0 standard drinks    Types: 7 - 14 Standard drinks or equivalent per week  . Drug use: Never  . Sexual activity: Not on file  Other Topics Concern  . Not on file  Social History Narrative  . Not on file   Social Determinants of Health   Financial Resource Strain:   . Difficulty of Paying Living Expenses:   Food Insecurity:   . Worried About Programme researcher, broadcasting/film/video in the Last Year:   . Barista in the Last Year:   Transportation Needs:   . Sales promotion account executive (Medical):   Marland Kitchen Lack of Transportation (Non-Medical):   Physical  Activity:   . Days of Exercise per Week:   . Minutes of Exercise per Session:   Stress:   . Feeling of Stress :   Social Connections:   . Frequency of Communication with Friends and Family:   . Frequency of Social Gatherings with Friends and Family:   . Attends Religious Services:   . Active Member of Clubs or Organizations:   . Attends Archivist Meetings:   Marland Kitchen Marital Status:   Intimate Partner Violence:   . Fear of Current or Ex-Partner:   . Emotionally Abused:   Marland Kitchen Physically Abused:   . Sexually Abused:     Family History:   Family History  Problem Relation Age of Onset  . CAD Father   . Valvular heart disease Father      ROS:  Please see the history of present illness.  All other ROS reviewed and negative.     Physical Exam/Data:   Vitals:   07/24/2019 0700 07/21/2019 0715 07/23/2019 0730 07/14/2019 0800  BP:      Pulse: 53 51 49   Resp: _0 Temp: 98.8 F (37.1 C) 98.6 F (37 C) 98.6 F (37 C)   TempSrc:    Bladder  SpO2: 97% 97% 97%   Weight:      Height:        Intake/Output Summary (Last 24 hours) at 08/01/2019 0816 Last data filed at 07/18/2019 0700 Gross per 24 hour  Intake 910.35 ml  Output 2135 ml  Net -1224.65 ml   Last 3 Weights 07/25/2019 07/30/2019 07/26/2019  Weight (lbs) 189 lb 9.5 oz 189 lb 9.5 oz 189 lb 9.5 oz  Weight (kg) 86 kg 86 kg 86 kg     Body mass index is 28 kg/m.  General:  Well nourished, intubated, sedated HEENT: normal Lymph: not asessed Neck: no JVD Endocrine:  No thryomegaly Vascular: No carotid bruits Cardiac:  VAD hum Lungs:  CTA b/l, intubated Abd: soft, not distended  Ext: no edema Musculoskeletal:  No deformities Skin: warm and dry  Neuro:  sedated Psych:  sedated  EKG:  The EKG was personally reviewed and demonstrates:   Wilburn Mylar is VF  06/28/2019 SR, artifact, QTc looked OK  Telemetry:  Telemetry was personally  reviewed and demonstrates:   Recurrent VF SB 40's, long 1st degree AVblock, PR near 49m  Relevant CV Studies:  07/09/2019: TTE IMPRESSIONS  1. Left ventricular ejection fraction, by estimation, is <20%. The left  ventricle has severely decreased function. The left ventricle demonstrates  global hypokinesis. The left ventricular internal cavity size was mildly  dilated. There is mild left  ventricular hypertrophy.  2. Right ventricular systolic function is mildly reduced. The right  ventricular size is normal.  3. The mitral valve is normal in structure. Trivial mitral valve  regurgitation. No evidence of mitral stenosis.  4. The aortic valve is poorly visualized but does not appear to open.  5. Ramp echo done with speed increased from 5000 rpm to 5100 rpm.   06/17/2019: R/LHC  Mid LM lesion is 75% stenosed.  Ost LAD lesion is 100% stenosed.  Ost Cx to Prox Cx lesion is 100% stenosed.  Ost RCA lesion is 100% stenosed.  Hemodynamic findings consistent with severe pulmonary hypertension.  Intra-aortic balloon pump was placed to help with hemodynamic stability  Intervention was deferred unable to cross with a wire ostial LAD  Intervention was otherwise deferred in favor of bypass surgery  Conclusion Evidence of  moderate to severe pulmonary hypertension Mean wedge of 43 mean PA of 53 Severe dilated cardiomyopathy probably ischemic ejection fraction less than 25% Moderate calcification of the proximal coronary arteries Multiple CTO's ostial LAD proximal circumflex ostial RCA Mild to moderate collaterals right to left to the LAD Minimal right to right collaterals Minimal left to left collaterals Coronaries 75% mid to distal left main 100% ostial LAD probable CTO 100% proximal circumflex probable CTO 100% ostial RCA probable CTO Collaterals as described Intervention was deferred after unable to cross with the wire for what was thought to be suspected CTO's of the  ostial LAD Intra-aortic balloon pump was placed right femoral artery to help with hemodynamic stability 7 [place] Swan was placed on left and the PA position to help with hemodynamic monitoring Patient was placed on IV heparin for anticoagulation Arrangements were made for transfer to [facility] for possible ICU CCU care and possible evaluation for coronary bypass surgery Dr. Nechama Guard accepted the patient in transfer to CCU:  Laboratory Data:  High Sensitivity Troponin:   Recent Labs  Lab 07/09/19 0740 07/09/19 0920  TROPONINIHS 3,337* 3,481*     Chemistry Recent Labs  Lab 07/17/2019 0405 07/05/2019 0405 07/13/2019 2222 07/12/2019 0325 07/14/2019 0409  NA 134*   < > 129* 125* 129*  K 4.3   < > 4.6 4.7 5.1  CL 101  --  96* 95*  --   CO2 23  --  22 18*  --   GLUCOSE 118*  --  180* 282*  --   BUN 14  --  13 13  --   CREATININE 1.02  --  1.04 0.95  --   CALCIUM 8.7*  --  8.6* 7.4*  --   GFRNONAA >60  --  >60 >60  --   GFRAA >60  --  >60 >60  --   ANIONGAP 10  --  11 12  --    < > = values in this interval not displayed.    Recent Labs  Lab 07/27/19 0400  PROT 6.0*  ALBUMIN 2.8*  AST 67*  ALT 104*  ALKPHOS 117  BILITOT 1.0   Hematology Recent Labs  Lab 07/24/19 0302 07/24/19 0302 07/27/19 0400 07/11/2019 0325 07/12/2019 0409  WBC 8.9  --  8.6 14.1*  --   RBC 4.05*  --  3.60* 3.15*  --   HGB 11.8*   < > 10.7* 9.7* 10.9*  HCT 38.0*   < > 34.4* 29.8* 32.0*  MCV 93.8  --  95.6 94.6  --   MCH 29.1  --  29.7 30.8  --   MCHC 31.1  --  31.1 32.6  --   RDW 16.4*  --  16.8* 16.9*  --   PLT 259  --  215 160  --    < > = values in this interval not displayed.   BNP Recent Labs  Lab 07/23/19 0243  BNP 1,811.9*    DDimer No results for input(s): DDIMER in the last 168 hours.   Radiology/Studies:  No results found.   Assessment and Plan:   1. Refractory VF     Continue amiodarone and lidocaine     SB 40's with long 1st degree AVblock     Agree, if unable to  suppress with drugs, temp wire and pacing may be helpful  Making urine  Difficult case non revascularized severe CAD, severe LV dysfunction Recurrent  refractory VF  Dr. Curt Bears has seen and examined  the patient this AM        For questions or updates, please contact [place] Please consult [email or web address] for contact info under    Signed, [place], PA-C  07/07/2019 8:16 AM  I have seen and examined this patient with Tommye Standard.  Agree with above, note added to reflect my findings.  On exam, LVAD hum, no edema.  Patient with a history of severe coronary artery disease who presented with STEMI in June without revascularization and now status post LVAD.  His prior hospitalization was complicated by refractory VT and VF.  [name] was eventually discharged on amiodarone and Ranexa.  Unfortunately yesterday, at rehab, [name] became quite short of breath.  An ECG was performed which showed recurrent ventricular fibrillation.  [name] was transferred to the ICU and cardioverted.  [name] had multiple further episodes of VF.  [name] was started on lidocaine overnight.  Upon initiation of lidocaine, his ventricular arrhythmias subsided.  [name] was found to be volume overloaded and is currently undergoing diuresis as tolerated.  Unfortunately [name] is requiring Neo-Synephrine and vasopressin.  [name] is intubated and sedated.  [name] was restarted on amiodarone drip and is also on lidocaine.  I feel that [name] Allesandra Huebsch likely need further escalation of antiarrhythmics.  We Josey Dettmann continue both lidocaine and amiodarone.  [name] Cola Gane likely need mexiletine at discharge.  Christon Parada defer to the heart failure team for treatment of his volume overload and UTI.  It is certainly possible these were complicating his arrhythmias.    Amore Ackman M. Sheldon Sem MD 07/25/2019 11:45 AM

## 2019-07-29 NOTE — Anesthesia Procedure Notes (Signed)
Date/Time: 07/27/2019 12:43 AM Performed by: Jearld Pies, CRNA Pre-anesthesia Checklist: Patient identified, Emergency Drugs available, Suction available, Patient being monitored and Timeout performed Patient Re-evaluated:Patient Re-evaluated prior to induction Oxygen Delivery Method: Ambu bag Preoxygenation: Pre-oxygenation with 100% oxygen Induction Type: IV induction Comments: Bensimhon MD at head of bed with Ambu bag, noted + ETCO2, chest rise and fall, 10L O2.

## 2019-07-29 NOTE — Progress Notes (Signed)
ANTICOAGULATION CONSULT NOTE  Pharmacy Consult for warfarin Indication: LVAD  Allergies  Allergen Reactions   Codeine Hives    Patient Measurements: Height: 5\' 9"  (175.3 cm) Weight: 86 kg (189 lb 9.5 oz) IBW/kg (Calculated) : 70.7 Heparin Dosing Weight: ~ 92 kg  Vital Signs: Temp: 98.4 F (36.9 C) (07/28 0800) Temp Source: Bladder (07/28 0800) BP: 86/71 (07/28 0848) Pulse Rate: 47 (07/28 0848)  Labs: Recent Labs    07/27/19 0400 07/27/19 0400 07/17/2019 0405 07/20/2019 2222 07/26/2019 0325 07/23/2019 0409 07/30/2019 0705  HGB 10.7*   < >  --   --  9.7* 10.9*  --   HCT 34.4*  --   --   --  29.8* 32.0*  --   PLT 215  --   --   --  160  --   --   APTT  --   --   --   --   --   --  47*  LABPROT 17.8*  --  16.8*  --   --   --  22.6*  INR 1.5*  --  1.4*  --   --   --  2.1*  CREATININE 0.97   < > 1.02 1.04 0.95  --   --    < > = values in this interval not displayed.    Estimated Creatinine Clearance: 95.4 mL/min (by C-G formula based on SCr of 0.95 mg/dL).  Assessment: 54 yo male s/p LVAD HM3 placement 6/24, followed by IABP 7/8-7/13.  Pharmacy asked to dose anticoagulation with warfarin.   INR now therapeutic at 2.1 s/p two boosted doses. Will continue warfarin dosing for now but will give reduced dose.  Goal of Therapy:  INR 2-2.5  Monitor platelets by anticoagulation protocol: Yes   Plan:  -Warfarin 2mg  PO x1 tonight -Daily INR   Arrie Senate, PharmD, BCPS Clinical Pharmacist [phone] Please check AMION for all [facility] numbers 07/13/2019

## 2019-07-29 NOTE — Transfer of Care (Signed)
Immediate Anesthesia Transfer of Care Note  Patient: Aaron [name]  Procedure(s) Performed: AN AD [place]  Patient Location: ICU  Anesthesia Type:MAC  Level of Consciousness: drowsy and responds to stimulation  Airway & Oxygen Therapy: Patient Spontanous Breathing and Patient connected to face mask oxygen  Post-op Assessment: Report given to RN and Post -op Vital signs reviewed and stable  Post vital signs: Reviewed and stable  Last Vitals:  Vitals Value Taken Time  BP 93/76 07/10/2019 0106  Temp    Pulse 30 07/30/2019 0108  Resp 28 07/17/2019 0108  SpO2 92 % 07/16/2019 0108  Vitals shown include unvalidated device data.  Last Pain:  Vitals:   07/26/2019 2100  TempSrc: Oral  PainSc:      Bensimhon MD at bedside, transfer of care back to Will RN.    Complications: No complications documented.

## 2019-07-29 NOTE — Procedures (Signed)
DIRECT CURRENT CARDIOVERSION  NAME:  Aaron [name]                            MRN:   [id number] DOB:  1965-11-12                                 ADMIT DATE: 07/25/2019   INDICATIONS: Ventricular fibrillation   PROCEDURE:  Emergent consent. Once an appropriate time out was taken, the patient had the defibrillator pads placed in the anterior and posterior position. The patient was intubated and deeply sedated. Once an appropriate level of sedation was achieved, the patient received a single biphasic, synchronized 200J shock with prompt conversion to sinus rhythm. Post DC-CV developed transient junctional rhythm followed by sinus rhythm.  He had return to VF after approximately 5 minutes.  Rebolused sedation, RASS -4, with repeat DCCV @ 100J and return to NSR.  Again with recurrent VF, again bolused sedation with appropriate RASS -4, and DCCV @ 50J.  Now in NSR.  Albertha Ghee MD Cardiology Fellow [place]

## 2019-07-29 NOTE — Anesthesia Preprocedure Evaluation (Signed)
Anesthesia Evaluation  Patient identified by MRN, date of birth, ID band Patient awake    Reviewed: Allergy & Precautions, NPO status , Patient's Chart, lab work & pertinent test results  Airway Mallampati: II  TM Distance: >3 FB     Dental   Pulmonary Current Smoker,    breath sounds clear to auscultation       Cardiovascular hypertension, + CAD, + Past MI and +CHF   Rhythm:Regular Rate:Normal     Neuro/Psych negative neurological ROS     GI/Hepatic negative GI ROS, Neg liver ROS,   Endo/Other  negative endocrine ROS  Renal/GU Renal disease     Musculoskeletal   Abdominal   Peds  Hematology negative hematology ROS (+)   Anesthesia Other Findings   Reproductive/Obstetrics                             Anesthesia Physical  Anesthesia Plan  ASA: IV  Anesthesia Plan: General   Post-op Pain Management:    Induction: Intravenous  PONV Risk Score and Plan: 1 and Treatment may vary due to age or medical condition  Airway Management Planned: Natural Airway and Mask  Additional Equipment: None  Intra-op Plan:   Post-operative Plan:   Informed Consent: I have reviewed the patients History and Physical, chart, labs and discussed the procedure including the risks, benefits and alternatives for the proposed anesthesia with the patient or authorized representative who has indicated his/her understanding and acceptance.       Plan Discussed with:   Anesthesia Plan Comments:         Anesthesia Quick Evaluation

## 2019-07-29 NOTE — Progress Notes (Signed)
Aaron [name] required multiple defibrillations following initial procedure detailed @ 3:30.  4 total shocks delivered with resumption of NSR for PVC mediated VT/VF, predominantly VF.  He was heavily sedated for all defibrillations and unreactive to shocks.  Additional 100 mg lidocaine bolused to complete loading dose, infusion increased to 2 mg/hr, and amiodarone infusion resumed @ 1 mg/hr.  Discussed with Aaron [name] planning on chronotropic support with temporary pacing wire and IABP insertion shortly.  Foley inserted with nearly two liters urine output.  Repeat labs with good acid base status, well perfused with lactate 1.5, stable renal function, Mg 2.4 w/ K 4.5.  Sodium down to 125 in context of decompensation with recurrent VF.  Aaron Ghee MD Cardiology Fellow [facility]

## 2019-07-29 NOTE — Progress Notes (Signed)
° °  Patient developed recurrent symptomatic VF with hemodynamic instability.  I gave him several boluses of neosynephrine. Followed by 6mg  of versed and 100 mg of fentanyl in staggered doses while supporting with bag mask oxygenation but unable to achieve desired sedation for cardioversion.   Anesthesia paged. He ws given 50mg  of propofol by Dr. Ola Spurr and underwent successful cardioversion with 200J.   Post cardioversion developed junctional rhythm with rates in 30-40 range. Amiodarone held temoprarily and given several more boluses of neosynephrine to support his BP.  Vasopressin gtt started.   Sats were stable in the low 90s but given persistent bradycardia we gave him 0.2mg  flumazenil to help reverse his sedation and HR quickly came back into the 50s as he was more awake.   Total CCT 1 hour not including DC-CV  Glori Bickers, MD  1:11 AM

## 2019-07-29 NOTE — Progress Notes (Signed)
Advanced Heart Failure VAD Team Note  PCP-Cardiologist: No primary care provider on file.   Subjective:    Had refractory VF throughout the night with ~10 shocks overnight. Now intubated.   On lido 2, amio 60. Neo 90. VP 0.03  Foley inserted with 2L urine out   LVAD INTERROGATION:  HeartMate 3 LVAD:   Flow 4.2 liters/min, speed 5200, power 3.6w, PI 3.6.    Objective:    Vital Signs:   Temp:  [98.9 F (37.2 C)-100.4 F (38 C)] 99.7 F (37.6 C) (07/28 0600) Pulse Rate:  [25-155] 54 (07/28 0600) Resp:  [16-34] 20 (07/28 0600) BP: (57-121)/(30-105) 96/66 (07/28 0600) SpO2:  [78 %-99 %] 92 % (07/28 0600) Arterial Line BP: (76-100)/(58-85) 78/68 (07/28 0600) FiO2 (%):  [60 %-100 %] 60 % (07/28 0411) Weight:  [86 kg] 86 kg (07/27 2030) Last BM Date: 07/27/19 Mean arterial Pressure 70-80s  Intake/Output:   Intake/Output Summary (Last 24 hours) at 07/23/2019 0622 Last data filed at 07/02/2019 0500 Gross per 24 hour  Intake 653.32 ml  Output 2050 ml  Net -1396.68 ml     Physical Exam    General:  Intubated sedated HEENT: normal + ETT Neck: supple. JVP up . Carotids 2+ bilat; no bruits. No lymphadenopathy or thyromegaly appreciated. Cor: Mechanical heart sounds with LVAD hum present. Lungs: coarse Abdomen: soft, nontender, nondistended. No hepatosplenomegaly. No bruits or masses. Good bowel sounds. Driveline: C/D/I; securement device intact and driveline incorporated Extremities: no cyanosis, clubbing, rash, edema Neuro: alert & orientedx3, cranial nerves grossly intact. moves all 4 extremities w/o difficulty. Affect pleasant   Telemetry   Sinus brady 50-60 multiple episodes VF Personally reviewed  Labs   Basic Metabolic Panel: Recent Labs  Lab 07/24/19 0302 07/24/19 0302 07/25/19 1514 07/25/19 1514 07/26/19 0423 07/27/19 0400 07/27/19 0400 07/30/2019 0405 07/20/2019 2222 07/11/2019 0325 07/15/2019 0409  NA 132*   < > 135   < >  --  133*  --  134* 129* 125*  129*  K 4.0   < > 4.4   < >  --  4.1  --  4.3 4.6 4.7 5.1  CL 98   < > 101  --   --  101  --  101 96* 95*  --   CO2 27   < > 25  --   --  24  --  23 22 18*  --   GLUCOSE 98   < > 93  --   --  99  --  118* 180* 282*  --   BUN 13   < > 16  --   --  13  --  _0 --   CREATININE 1.09   < > 1.05  --   --  0.97  --  1.02 1.04 0.95  --   CALCIUM 8.7*   < > 8.6*   < >  --  8.5*   < > 8.7* 8.6* 7.4*  --   MG 2.0  --   --   --  1.8 2.1  --   --  1.8 2.4  --    < > = values in this interval not displayed.    Liver Function Tests: Recent Labs  Lab 07/27/19 0400  AST 67*  ALT 104*  ALKPHOS 117  BILITOT 1.0  PROT 6.0*  ALBUMIN 2.8*   No results for input(s): LIPASE, AMYLASE in the last 168 hours. No results for input(s): AMMONIA in the last  168 hours.  CBC: Recent Labs  Lab 07/23/19 0243 07/24/19 0302 07/27/19 0400 07/27/2019 0325 08/01/2019 0409  WBC 8.9 8.9 8.6 14.1*  --   NEUTROABS  --   --  6.3  --   --   HGB 12.2* 11.8* 10.7* 9.7* 10.9*  HCT 39.3 38.0* 34.4* 29.8* 32.0*  MCV 93.6 93.8 95.6 94.6  --   PLT 285 259 215 160  --     INR: Recent Labs  Lab 07/24/19 1838 07/25/19 1514 07/26/19 0423 07/27/19 0400 07/10/2019 0405  INR 3.2* 2.5* 2.0* 1.5* 1.4*    Other results:    Imaging    No results found.   Medications:     Scheduled Medications: . aspirin EC  81 mg Oral Daily  . atorvastatin  80 mg Oral Daily  . atropine      . chlorhexidine gluconate (MEDLINE KIT)  15 mL Mouth Rinse BID  . Chlorhexidine Gluconate Cloth  6 each Topical Daily  . clonazePAM  0.5 mg Oral BID  . digoxin  0.125 mg Oral Daily  . folic acid  1 mg Oral Daily  . gabapentin  300 mg Oral TID  . lidocaine (cardiac) 100 mg/38m      . mouth rinse  15 mL Mouth Rinse 10 times per day  . melatonin  5 mg Oral QHS  . mexiletine  200 mg Oral Q12H  . multivitamin with minerals  1 tablet Oral Daily  . pantoprazole  40 mg Oral Daily  . ranolazine  500 mg Oral BID  . sildenafil  20 mg Oral  TID  . sodium chloride flush  10-40 mL Intracatheter Q12H  . thiamine  100 mg Oral Daily  . Warfarin - Pharmacist Dosing Inpatient   Does not apply q1600     Infusions: . sodium chloride    . sodium chloride 10 mL/hr at 07/13/2019 0605  . amiodarone 60 mg/hr (07/06/2019 0607)  . lidocaine 2 mg/min (07/04/2019 0507)  . phenylephrine (NEO-SYNEPHRINE) Adult infusion 100 mcg/min (07/18/2019 0519)  . propofol (DIPRIVAN) infusion 80 mcg/kg/min (07/16/2019 0518)  . vasopressin 0.03 Units/min (07/18/2019 0500)     PRN Medications:  Place/Maintain arterial line **AND** sodium chloride, sodium chloride, oxyCODONE, sodium chloride flush, traMADol   Assessment/Plan:    1. Refractory Ventricular Fibrillation  -multiple episodes ~10 of VF last night - now in NSR over the past 1-2 hours on lido 2, amio 60 - have d/w EP. If recurs will consider putting IABP and TVP in  - continue Ranexa 500 mg bid - Keep K > 4.0 and Mg > 2.0. - bedside echo 7/28 cannula ok  2. Acute Systolic Heart Failure s/p VAD - EF 218% RV systolic function mildly reduced - HMIII LVAD Placed 6/24 - RV looks good on echo  Cannula ok  - Follow co-ox - avoid inotropes with VF - INR pending. Will likely need low-dose heparin.   3. Acute hypoxic resp failure - intubated in setting of VF - will consult CCM for vent management  4. CAD - S/p Acute STEMI 6/21.Emergent cath showed severe multivessel disease w/ 75% mid- distal LM, 100% prox LAD, not amendable to PCI (unable to cross w/ guide wire, probable CTO), 100% prox LCx (probable CTO) and 100% ostial RCA (probable CTO). There were mildto moderate collaterals right to left to the LAD, minimal right to right collateralsan minimalleft to left collaterals.LVEF 25% -Hs trop peaked 27,000. - No targets for CABG / PCI.  - S/P HMIII VAD -  Continue ASA + statin   5. Renal mass = RCC - MRI abdomen- 1.8 cm right renal lesion, most consistent with renal cell carcinoma. No  evidence of renal vein involvement or nodal metastasis. - Discussed w/ Urology and R RCC is small and will just require surveillance. Ok to proceed with VAD from their standpoint.   6. Urinary retention - Foley placed  Additional CCT 60 mins  I reviewed the LVAD parameters from today, and compared the results to the patient's prior recorded data.  No programming changes were made.  The LVAD is functioning within specified parameters.  The patient performs LVAD self-test daily.  LVAD interrogation was negative for any significant power changes, alarms or PI events/speed drops.  LVAD equipment check completed and is in good working order.  Back-up equipment present.   LVAD education done on emergency procedures and precautions and reviewed exit site care.  Length of Stay: 1  Glori Bickers, MD 07/05/2019, 6:22 AM  VAD Team --- VAD ISSUES ONLY--- Pager [phone] (7am - 7am)  Advanced Heart Failure Team  Pager [phone] (M-F; 7a - 4p)  Please contact [facility] for night-coverage after hours (4p -7a ) and weekends on [email or web address]

## 2019-07-29 NOTE — Procedures (Signed)
Arterial Catheter Insertion Procedure Note  KIRK BASQUEZ  [id number]  Aug 22, 1965  Date:07/07/2019  Time:4:09 AM    Provider Performing: Lynann Bologna    Procedure: Insertion of Arterial Line [phone]) without US guidance  Indication(s) Blood pressure monitoring and/or need for frequent ABGs  Consent Unable to obtain consent due to emergent nature of procedure.  Anesthesia None   Time Out Verified patient identification, verified procedure, site/side was marked, verified correct patient position, special equipment/implants available, medications/allergies/relevant history reviewed, required imaging and test results available.   Sterile Technique Maximal sterile technique including full sterile barrier drape, hand hygiene, sterile gown, sterile gloves, mask, hair covering, sterile ultrasound probe cover (if used).   Procedure Description Area of catheter insertion was cleaned with chlorhexidine and draped in sterile fashion. Without real-time ultrasound guidance an arterial catheter was placed into the left radial artery.  Appropriate arterial tracings confirmed on monitor.     Complications/Tolerance None; patient tolerated the procedure well.   EBL Minimal   Specimen(s) None

## 2019-07-29 NOTE — Progress Notes (Signed)
Initial Nutrition Assessment  DOCUMENTATION CODES:   Severe malnutrition in context of acute illness/injury  INTERVENTION:   Tube feeding:  -Vital AF 1.2 @ 20 ml/hr via OG -Increase by 10 ml Q8 hours to goal rate of 50 ml/hr (1200 ml) -90 ml ProSource TID  At goal rate TF provides: 1680 kcals (2633 with propofol), 156 grams protein, 973 ml free water. Kcal exceed requirement to meet protein needs.   NUTRITION DIAGNOSIS:   Severe Malnutrition related to acute illness (CHF with HM3 implantation) as evidenced by moderate fat depletion, moderate muscle depletion, energy intake < or equal to 50% for > or equal to 5 days.  GOAL:   Patient will meet greater than or equal to 90% of their needs  MONITOR:   Diet advancement, Vent status, Skin, TF tolerance, Weight trends, Labs, I & O's  REASON FOR ASSESSMENT:   Ventilator    ASSESSMENT:   Patient with PMH significant for MI, CAD, HTN, ischemic cardiomyopathy, CHF Recently admitted with acute STEMI, required multiple DCCV, IABP, and subsequent VAD for cardiogenic shock (HM3 placed 6/24). Presents back to ICU from rehab with refractory VF.   Pt discussed during ICU rounds and with RN.   Requiring pressors. May need IABP. Okay to feed per HF. Plan slow titration.    Pt well known to clinical nutrition service. Appetite was slow to progress after HM3 implantation one month ago. Per previous nutrition notes, pt consumed 0-50% of meals and had many skipped meals. Did not care for supplementation. During pt's last ICU admission he endorsed a UBW of 210 lb. Unable to quantify actual weight loss given fluid fluctuations but suspect some dry wt loss. NFPE showed no depletions at the beginning of last admission and now show mild to moderate muscle and fat depletion which meets criteria for acute severe malnutrition.    Patient is currently intubated on ventilator support MV: 10 L/min Temp (24hrs), Avg:98.6 F (37 C), Min:96.4 F (35.8 C),  Max:100.4 F (38 C)  Propofol: 36.1 ml/hr- provides 953 kcal from lipids daily   I/O: -820 ml since admit UOP: 2,135 ml x 24 hrs   Drips: propofol, vasopressin, neo Medications: folic acid, MVI with minerals, thimaine Labs: Na 129 (L) CBG 89-282  NUTRITION - FOCUSED PHYSICAL EXAM:    Most Recent Value  Orbital Region Mild depletion  Upper Arm Region Moderate depletion  Thoracic and Lumbar Region Unable to assess  Buccal Region Moderate depletion  Temple Region Moderate depletion  Clavicle Bone Region Moderate depletion  Clavicle and Acromion Bone Region Moderate depletion  Scapular Bone Region Unable to assess  Dorsal Hand No depletion  Patellar Region Unable to assess  Anterior Thigh Region Mild depletion  Posterior Calf Region Mild depletion  Edema (RD Assessment) Mild  Hair Reviewed  Eyes Unable to assess  Mouth Unable to assess  Skin Reviewed  Nails Reviewed     Diet Order:   Diet Order    None      EDUCATION NEEDS:   Not appropriate for education at this time  Skin:  Skin Assessment: Skin Integrity Issues: Skin Integrity Issues:: Incisions Incisions: chest, abdomen  Last BM:  7/26  Height:   Ht Readings from Last 1 Encounters:  07/30/2019 5\' 9"  (1.753 m)    Weight:   Wt Readings from Last 1 Encounters:  07/15/2019 86 kg    BMI:  Body mass index is 28 kg/m.  Estimated Nutritional Needs:   Kcal:  2150-2580 kcal  Protein:  130-172 grams  Fluid:  >/= 2.1 L/day   Mariana Single RD, LDN Clinical Nutrition Pager listed in [place]

## 2019-07-29 NOTE — Progress Notes (Addendum)
Pt with recurrent VF/VT. Dr. Haroldine Laws at bedside for cardioversion. MD bag-mask ventilated patient while RN administered medication.  Medications administered as follows per MD order: 0020: Amio bolus 0026: 4mg  versed/53mcg fentanyl 0028: 2mg  versed 0031: 83mcg fentanyl 0033: 39mcg neo 0038: 38mcg neo  Anesthesia then took over once they arrived to bedside. See their note for medication admin and DCCV procedure.   Post procedure:  4854: 0.2mg  flumazenil 0100: 120 neo 0102: 80 neo  Pt arousable to name, mentating, and oxygenating appropriately. HR in the 40's.   MD orders to start vaso for BP support- add neo if needed. Give 1/2 amp atropine if HR drops and maintains in the 30's, and keep amio off for now.

## 2019-07-29 NOTE — Progress Notes (Signed)
CRITICAL VALUE ALERT  Critical Value:  Lactic 2.2   Date & Time Notied:  07/07/2019   Provider Notified: Dr. Curt Bears  Orders Received/Actions taken: See orders. UA order placed

## 2019-07-29 NOTE — CV Procedure (Signed)
° °   DIRECT CURRENT CARDIOVERSION  NAME:  Aaron [name]   MRN: [id number] DOB:  01-03-1965   ADMIT DATE: 07/23/2019   INDICATIONS: Ventricular fibrillation    PROCEDURE:   Emergent consent. Once an appropriate time out was taken, the patient had the defibrillator pads placed in the anterior and posterior position. The patient then underwent sedation by the anesthesia service. Once an appropriate level of sedation was achieved, the patient received a single biphasic, synchronized 200J shock with prompt conversion to sinus rhythm. Post DC-CV developed transient junctional rhythm in the 30s.   Glori Bickers, MD  1:12 AM

## 2019-07-29 NOTE — Anesthesia Procedure Notes (Signed)
Procedure Name: Intubation Date/Time: 07/28/2019 2:59 AM Performed by: Jearld Pies, CRNA Pre-anesthesia Checklist: Patient identified, Emergency Drugs available, Suction available and Patient being monitored Patient Re-evaluated:Patient Re-evaluated prior to induction Oxygen Delivery Method: Ambu bag Preoxygenation: Pre-oxygenation with 100% oxygen Induction Type: IV induction Ventilation: Mask ventilation without difficulty Laryngoscope Size: Glidescope and 4 Grade View: Grade I Tube type: Oral Tube size: 8.0 mm Number of attempts: 1 Airway Equipment and Method: Stylet and Video-laryngoscopy Placement Confirmation: ETT inserted through vocal cords under direct vision,  breath sounds checked- equal and bilateral and CO2 detector Secured at: 23 cm Tube secured with: Tape Dental Injury: Teeth and Oropharynx as per pre-operative assessment

## 2019-07-29 NOTE — Addendum Note (Signed)
Addendum  created 07/31/2019 0110 by Jearld Pies, CRNA   Charge Capture section accepted, Clinical Note Signed

## 2019-07-30 ENCOUNTER — Inpatient Hospital Stay (HOSPITAL_COMMUNITY): Payer: BC Managed Care – PPO

## 2019-07-30 ENCOUNTER — Encounter (HOSPITAL_COMMUNITY): Payer: Self-pay | Admitting: Internal Medicine

## 2019-07-30 DIAGNOSIS — J9601 Acute respiratory failure with hypoxia: Secondary | ICD-10-CM

## 2019-07-30 DIAGNOSIS — R579 Shock, unspecified: Secondary | ICD-10-CM

## 2019-07-30 LAB — POCT I-STAT 7, (LYTES, BLD GAS, ICA,H+H)
Acid-base deficit: 1 mmol/L (ref 0.0–2.0)
Bicarbonate: 22.1 mmol/L (ref 20.0–28.0)
Calcium, Ion: 1.08 mmol/L — ABNORMAL LOW (ref 1.15–1.40)
HCT: 31 % — ABNORMAL LOW (ref 39.0–52.0)
Hemoglobin: 10.5 g/dL — ABNORMAL LOW (ref 13.0–17.0)
O2 Saturation: 97 %
Patient temperature: 37.6
Potassium: 4 mmol/L (ref 3.5–5.1)
Sodium: 128 mmol/L — ABNORMAL LOW (ref 135–145)
TCO2: 23 mmol/L (ref 22–32)
pCO2 arterial: 32.3 mmHg (ref 32.0–48.0)
pH, Arterial: 7.447 (ref 7.350–7.450)
pO2, Arterial: 91 mmHg (ref 83.0–108.0)

## 2019-07-30 LAB — BASIC METABOLIC PANEL
Anion gap: 11 (ref 5–15)
Anion gap: 10 (ref 5–15)
BUN: 21 mg/dL — ABNORMAL HIGH (ref 6–20)
BUN: 24 mg/dL — ABNORMAL HIGH (ref 6–20)
CO2: 21 mmol/L — ABNORMAL LOW (ref 22–32)
CO2: 19 mmol/L — ABNORMAL LOW (ref 22–32)
Calcium: 8.3 mg/dL — ABNORMAL LOW (ref 8.9–10.3)
Calcium: 7.9 mg/dL — ABNORMAL LOW (ref 8.9–10.3)
Chloride: 95 mmol/L — ABNORMAL LOW (ref 98–111)
Chloride: 95 mmol/L — ABNORMAL LOW (ref 98–111)
Creatinine, Ser: 0.95 mg/dL (ref 0.61–1.24)
Creatinine, Ser: 1.01 mg/dL (ref 0.61–1.24)
GFR calc Af Amer: >60 mL/min (ref >60)
GFR calc Af Amer: >60 mL/min (ref >60)
GFR calc non Af Amer: >60 mL/min (ref >60)
GFR calc non Af Amer: >60 mL/min (ref >60)
Glucose, Bld: 138 mg/dL — ABNORMAL HIGH (ref 70–99)
Glucose, Bld: 125 mg/dL — ABNORMAL HIGH (ref 70–99)
Potassium: 3.1 mmol/L — ABNORMAL LOW (ref 3.5–5.1)
Potassium: 3.8 mmol/L (ref 3.5–5.1)
Sodium: 127 mmol/L — ABNORMAL LOW (ref 135–145)
Sodium: 124 mmol/L — ABNORMAL LOW (ref 135–145)

## 2019-07-30 LAB — MAGNESIUM: Magnesium: 1.9 mg/dL (ref 1.7–2.4)

## 2019-07-30 LAB — PHOSPHORUS
Phosphorus: 3.6 mg/dL (ref 2.5–4.6)
Phosphorus: 3.4 mg/dL (ref 2.5–4.6)

## 2019-07-30 LAB — PROTIME-INR
INR: 3.5 — ABNORMAL HIGH (ref 0.8–1.2)
Prothrombin Time: 33.9 seconds — ABNORMAL HIGH (ref 11.4–15.2)

## 2019-07-30 LAB — CBC
HCT: 31.4 % — ABNORMAL LOW (ref 39.0–52.0)
Hemoglobin: 10 g/dL — ABNORMAL LOW (ref 13.0–17.0)
MCH: 29.5 pg (ref 26.0–34.0)
MCHC: 31.8 g/dL (ref 30.0–36.0)
MCV: 92.6 fL (ref 80.0–100.0)
Platelets: 159 10*3/uL (ref 150–400)
RBC: 3.39 MIL/uL — ABNORMAL LOW (ref 4.22–5.81)
RDW: 16.6 % — ABNORMAL HIGH (ref 11.5–15.5)
WBC: 8.6 10*3/uL (ref 4.0–10.5)
nRBC: 0 % (ref 0.0–0.2)

## 2019-07-30 LAB — GLUCOSE, CAPILLARY
Glucose-Capillary: 131 mg/dL — ABNORMAL HIGH (ref 70–99)
Glucose-Capillary: 152 mg/dL — ABNORMAL HIGH (ref 70–99)
Glucose-Capillary: 134 mg/dL — ABNORMAL HIGH (ref 70–99)
Glucose-Capillary: 115 mg/dL — ABNORMAL HIGH (ref 70–99)
Glucose-Capillary: 114 mg/dL — ABNORMAL HIGH (ref 70–99)

## 2019-07-30 LAB — URINE CULTURE: Culture: NO GROWTH

## 2019-07-30 LAB — COOXEMETRY PANEL
Carboxyhemoglobin: 1.6 % — ABNORMAL HIGH (ref 0.5–1.5)
Methemoglobin: 1 % (ref 0.0–1.5)
O2 Saturation: 64.8 %
Total hemoglobin: 14.5 g/dL (ref 12.0–16.0)

## 2019-07-30 LAB — LACTIC ACID, PLASMA: Lactic Acid, Venous: 1.9 mmol/L (ref 0.5–1.9)

## 2019-07-30 LAB — LIDOCAINE LEVEL: Lidocaine Lvl: 5 ug/mL (ref 1.5–5.0)

## 2019-07-30 LAB — LACTATE DEHYDROGENASE: LDH: 250 U/L — ABNORMAL HIGH (ref 98–192)

## 2019-07-30 MED ORDER — FUROSEMIDE 10 MG/ML IJ SOLN
40.0000 mg | Freq: Once | INTRAMUSCULAR | Status: AC
  Administered 2019-07-30: 40 mg via INTRAVENOUS
  Filled 2019-07-30: qty 4

## 2019-07-30 MED ORDER — POTASSIUM CHLORIDE 10 MEQ/50ML IV SOLN
10.0000 meq | INTRAVENOUS | Status: AC
  Administered 2019-07-30 (×4): 10 meq via INTRAVENOUS
  Filled 2019-07-30 (×4): qty 50

## 2019-07-30 MED ORDER — FENTANYL BOLUS VIA INFUSION
50.0000 ug | INTRAVENOUS | Status: DC | PRN
  Administered 2019-07-31 – 2019-08-01 (×4): 100 ug via INTRAVENOUS
  Administered 2019-08-01 – 2019-08-05 (×7): 50 ug via INTRAVENOUS
  Administered 2019-08-05: 25 ug via INTRAVENOUS
  Administered 2019-08-05: 50 ug via INTRAVENOUS
  Filled 2019-07-30: qty 50

## 2019-07-30 MED ORDER — CHLORHEXIDINE GLUCONATE 0.12% ORAL RINSE (MEDLINE KIT): 15.0000 mL | Freq: Two times a day (BID) | OROMUCOSAL | Status: DC

## 2019-07-30 MED ORDER — POLYETHYLENE GLYCOL 3350 17 G PO PACK
17.0000 g | PACK | Freq: Every day | ORAL | Status: DC
  Administered 2019-07-30 – 2019-08-11 (×4): 17 g via ORAL
  Filled 2019-07-30 (×7): qty 1

## 2019-07-30 MED ORDER — FENTANYL 2500MCG IN NS 250ML (10MCG/ML) PREMIX INFUSION
50.0000 ug/h | INTRAVENOUS | Status: DC
  Administered 2019-07-30: 50 ug/h via INTRAVENOUS
  Administered 2019-07-31: 150 ug/h via INTRAVENOUS
  Administered 2019-07-31: 175 ug/h via INTRAVENOUS
  Administered 2019-08-01: 200 ug/h via INTRAVENOUS
  Administered 2019-08-01 – 2019-08-03 (×6): 250 ug/h via INTRAVENOUS
  Administered 2019-08-03 – 2019-08-04 (×3): 200 ug/h via INTRAVENOUS
  Administered 2019-08-05: 250 ug/h via INTRAVENOUS
  Administered 2019-08-05: 175 ug/h via INTRAVENOUS
  Administered 2019-08-06: 100 ug/h via INTRAVENOUS
  Administered 2019-08-06: 250 ug/h via INTRAVENOUS
  Administered 2019-08-07 – 2019-08-08 (×2): 100 ug/h via INTRAVENOUS
  Administered 2019-08-09: 125 ug/h via INTRAVENOUS
  Administered 2019-08-10: 75 ug/h via INTRAVENOUS
  Administered 2019-08-14: 100 ug/h via INTRAVENOUS
  Filled 2019-07-30 (×22): qty 250

## 2019-07-30 MED ORDER — ORAL CARE MOUTH RINSE
15.0000 mL | OROMUCOSAL | Status: DC
  Administered 2019-07-30 – 2019-08-07 (×80): 15 mL via OROMUCOSAL

## 2019-07-30 MED ORDER — POTASSIUM CHLORIDE 20 MEQ/15ML (10%) PO SOLN
40.0000 meq | Freq: Once | ORAL | Status: AC
  Administered 2019-07-30: 40 meq
  Filled 2019-07-30: qty 30

## 2019-07-30 MED ORDER — MIDAZOLAM HCL 2 MG/2ML IJ SOLN
2.0000 mg | INTRAMUSCULAR | Status: DC | PRN
  Administered 2019-07-31 – 2019-08-01 (×8): 2 mg via INTRAVENOUS
  Filled 2019-07-30 (×8): qty 2

## 2019-07-30 MED ORDER — MAGNESIUM SULFATE 2 GM/50ML IV SOLN
INTRAVENOUS | Status: AC
  Filled 2019-07-30: qty 50

## 2019-07-30 MED ORDER — POTASSIUM CHLORIDE 20 MEQ/15ML (10%) PO SOLN
40.0000 meq | Freq: Once | ORAL | Status: AC
  Administered 2019-07-30: 40 meq via ORAL
  Filled 2019-07-30: qty 30

## 2019-07-30 MED ORDER — ACETAMINOPHEN 500 MG PO TABS
1000.0000 mg | ORAL_TABLET | Freq: Four times a day (QID) | ORAL | Status: DC | PRN
  Administered 2019-07-30 (×2): 1000 mg via ORAL
  Filled 2019-07-30 (×3): qty 2

## 2019-07-30 MED ORDER — FUROSEMIDE 10 MG/ML IJ SOLN
40.0000 mg | Freq: Once | INTRAMUSCULAR | Status: AC
  Administered 2019-07-30: 40 mg via INTRAVENOUS
  Filled 2019-07-30 (×2): qty 4

## 2019-07-30 MED ORDER — DOCUSATE SODIUM 50 MG/5ML PO LIQD
100.0000 mg | Freq: Two times a day (BID) | ORAL | Status: DC
  Administered 2019-07-30 (×2): 100 mg via ORAL
  Filled 2019-07-30 (×3): qty 10

## 2019-07-30 MED ORDER — MAGNESIUM SULFATE 2 GM/50ML IV SOLN
2.0000 g | Freq: Once | INTRAVENOUS | Status: AC
  Administered 2019-07-30: 2 g via INTRAVENOUS
  Filled 2019-07-30: qty 50

## 2019-07-30 MED ORDER — FENTANYL CITRATE (PF) 100 MCG/2ML IJ SOLN
50.0000 ug | Freq: Once | INTRAMUSCULAR | Status: AC
  Administered 2019-07-30: 50 ug via INTRAVENOUS
  Filled 2019-07-30: qty 2

## 2019-07-30 MED ORDER — MIDAZOLAM HCL 2 MG/2ML IJ SOLN
2.0000 mg | INTRAMUSCULAR | Status: AC | PRN
  Administered 2019-07-31 (×3): 2 mg via INTRAVENOUS
  Filled 2019-07-30 (×5): qty 2

## 2019-07-30 NOTE — Progress Notes (Signed)
ANTICOAGULATION CONSULT NOTE  Pharmacy Consult for warfarin Indication: LVAD  Allergies  Allergen Reactions   Codeine Hives    Patient Measurements: Height: 5\' 9"  (175.3 cm) Weight: 89.1 kg (196 lb 6.9 oz) (blanket & pillow- controller lifted off bed) IBW/kg (Calculated) : 70.7 Heparin Dosing Weight: ~ 92 kg  Vital Signs: Temp: 96.6 F (35.9 C) (07/29 0515) Temp Source: Bladder (07/29 0000) BP: 72/57 (07/29 0425) Pulse Rate: 34 (07/29 0515)  Labs: Recent Labs    07/08/2019 0405 07/30/2019 0405 07/06/2019 2222 07/25/2019 0325 07/07/2019 0325 07/27/2019 0409 07/28/2019 0705 07/30/19 0437  HGB  --   --   --  9.7*   < > 10.9*  --  10.0*  HCT  --   --   --  29.8*  --  32.0*  --  31.4*  PLT  --   --   --  160  --   --   --  159  APTT  --   --   --   --   --   --  47*  --   LABPROT 16.8*  --   --   --   --   --  22.6* 33.9*  INR 1.4*  --   --   --   --   --  2.1* 3.5*  CREATININE 1.02   < > 1.04 0.95  --   --   --  0.95   < > = values in this interval not displayed.    Estimated Creatinine Clearance: 97.1 mL/min (by C-G formula based on SCr of 0.95 mg/dL).  Assessment: 54 yo male s/p LVAD HM3 placement 6/24, followed by IABP 7/8-7/13.  Pharmacy asked to dose anticoagulation with warfarin.   INR up to 3.5 this morning, will hold tonight.  Goal of Therapy:  INR 2-2.5  Monitor platelets by anticoagulation protocol: Yes   Plan:  -Hold warfarin tonight -Daily INR   Arrie Senate, PharmD, BCPS Clinical Pharmacist [phone] Please check AMION for all [place] numbers 07/30/2019

## 2019-07-30 NOTE — Progress Notes (Addendum)
Advanced Heart Failure VAD Team Note  PCP-Cardiologist: No primary care provider on file.   Subjective:    Had refractory VF throughout the night 7/28 w/ ~10 shocks. Now intubated.   Rhythm has remained stable past 24 hrs. No further VF nor VT. Sustaining sinus brady w/ rates in the mid 57s.  On lidocaine gtt 2 mg/min (level pending) + amio 30 mg/hr. Neo 10. VP 0.03. Ranexa and Mexiletine on hold (unable to crush and give through tube).   K 3.1.  Mg 1.9  UA 7/28 + for UTI. Febrile yesterday mTemp 100. WBC elevated at 14K. Lactic acid elevated at 2.2. Urine culture and blood cultures obtained and started on broad spectrum abx w/ cefepime and vanc. Cultures pending. Now AF. WBC normalized,14>>8.6K.   Wt up 7 lb. CVP 18. -2L out yesterday w/ IV Lasix.    LVAD INTERROGATION:  HeartMate 3 LVAD:   Flow 3.8 liters/min, speed 5200, power 3.6 PI 3.5.  Many PI events   Objective:    Vital Signs:   Temp:  [95.7 F (35.4 C)-98.6 F (37 C)] 96.1 F (35.6 C) (07/29 0700) Pulse Rate:  [18-61] 36 (07/29 0700) Resp:  [0-40] 18 (07/29 0700) BP: (72-105)/(57-77) 86/76 (07/29 0600) SpO2:  [95 %-100 %] 96 % (07/29 0700) Arterial Line BP: (69-249)/(21-76) 70/57 (07/29 0700) FiO2 (%):  [50 %-60 %] 50 % (07/29 0425) Weight:  [89.1 kg] 89.1 kg (07/29 0500) Last BM Date: 07/27/19 Mean arterial Pressure 70-80s  Intake/Output:   Intake/Output Summary (Last 24 hours) at 07/30/2019 0716 Last data filed at 07/30/2019 0700 Gross per 24 hour  Intake 3952.04 ml  Output 1990 ml  Net 1962.04 ml     Physical Exam    CVP 18  General:  Intubated sedated HEENT: normal + ETT Neck: supple. JVP ~16 cm . Carotids 2+ bilat; no bruits. No lymphadenopathy or thyromegaly appreciated. Cor: + Mechanical heart sounds with LVAD hum present. Lungs: intubated, coarse BS bilaterally  Abdomen: soft, nontender, nondistended. No hepatosplenomegaly. No bruits or masses. Good bowel sounds. Driveline: C/D/I;  securement device intact and driveline incorporated Extremities: no cyanosis, clubbing, rash, edema Neuro: alert & orientedx3, cranial nerves grossly intact. moves all 4 extremities w/o difficulty. Affect pleasant   Telemetry   Sinus brady 40s  No further VF Personally reviewed  Labs   Basic Metabolic Panel: Recent Labs  Lab 07/25/19 1514 07/26/19 0423 07/27/19 0400 07/27/19 0400 07/25/2019 0405 07/20/2019 0405 07/10/2019 2222 07/05/2019 0325 07/02/2019 0409 07/10/2019 1742 07/30/19 0437  NA   < >  --  133*   < > 134*  --  129* 125* 129*  --  127*  K   < >  --  4.1   < > 4.3  --  4.6 4.7 5.1  --  3.1*  CL   < >  --  101  --  101  --  96* 95*  --   --  95*  CO2   < >  --  24  --  23  --  22 18*  --   --  21*  GLUCOSE   < >  --  99  --  118*  --  180* 282*  --   --  138*  BUN   < >  --  13  --  14  --  13 13  --   --  21*  CREATININE   < >  --  0.97  --  1.02  --  1.04 0.95  --   --  0.95  CALCIUM   < >  --  8.5*   < > 8.7*   < > 8.6* 7.4*  --   --  8.3*  MG  --  1.8 2.1  --   --   --  1.8 2.4  --   --  1.9  PHOS  --   --   --   --   --   --   --   --   --  4.5 3.6   < > = values in this interval not displayed.    Liver Function Tests: Recent Labs  Lab 07/27/19 0400  AST 67*  ALT 104*  ALKPHOS 117  BILITOT 1.0  PROT 6.0*  ALBUMIN 2.8*   No results for input(s): LIPASE, AMYLASE in the last 168 hours. No results for input(s): AMMONIA in the last 168 hours.  CBC: Recent Labs  Lab 07/24/19 0302 07/27/19 0400 08/01/2019 0325 07/02/2019 0409 07/30/19 0437  WBC 8.9 8.6 14.1*  --  8.6  NEUTROABS  --  6.3  --   --   --   HGB 11.8* 10.7* 9.7* 10.9* 10.0*  HCT 38.0* 34.4* 29.8* 32.0* 31.4*  MCV 93.8 95.6 94.6  --  92.6  PLT 259 215 160  --  159    INR: Recent Labs  Lab 07/26/19 0423 07/27/19 0400 07/24/2019 0405 07/23/2019 0705 07/30/19 0437  INR 2.0* 1.5* 1.4* 2.1* 3.5*    Other results:    Imaging   DG Abd Portable 1V  Result Date: 07/02/2019 CLINICAL DATA:   Orogastric tube placement. EXAM: PORTABLE ABDOMEN - 1 VIEW COMPARISON:  07/16/2019 chest radiograph. FINDINGS: Significantly artifact degraded exam due to overlying support apparatus, including left ventricular assist device. Nasogastric tube terminates at the body of the stomach. Median sternotomy wires. Prominent gas-filled loops of small bowel in the upper abdomen may represent adynamic ileus. No gross free intraperitoneal air. IMPRESSION: Nasogastric tube terminating at the body of the stomach. Electronically Signed   By: Abigail Miyamoto M.D.   On: 07/24/2019 11:34     Medications:     Scheduled Medications: . aspirin  81 mg Per Tube Daily  . atorvastatin  80 mg Per Tube Daily  . chlorhexidine gluconate (MEDLINE KIT)  15 mL Mouth Rinse BID  . Chlorhexidine Gluconate Cloth  6 each Topical Daily  . clonazePAM  0.5 mg Per Tube BID  . digoxin  0.125 mg Per Tube Daily  . feeding supplement (PROSource TF)  90 mL Per Tube TID  . folic acid  1 mg Per Tube Daily  . gabapentin  300 mg Per Tube Q8H  . mouth rinse  15 mL Mouth Rinse 10 times per day  . multivitamin with minerals  1 tablet Per Tube Daily  . pantoprazole sodium  40 mg Per Tube Daily  . sildenafil  20 mg Per Tube TID  . sodium chloride flush  10-40 mL Intracatheter Q12H  . thiamine  100 mg Per Tube Daily  . Warfarin - Pharmacist Dosing Inpatient   Does not apply q1600    Infusions: . sodium chloride    . sodium chloride Stopped (07/30/19 5681)  . amiodarone 30 mg/hr (07/30/19 0700)  . ceFEPime (MAXIPIME) IV Stopped (07/30/19 0600)  . feeding supplement (VITAL AF 1.2 CAL) 30 mL/hr at 07/30/19 0400  . lidocaine 2 mg/min (07/30/19 0700)  . phenylephrine (NEO-SYNEPHRINE) Adult infusion 5 mcg/min (07/30/19 0700)  . potassium chloride  50 mL/hr at 07/30/19 0700  . propofol (DIPRIVAN) infusion 65 mcg/kg/min (07/30/19 0700)  . vancomycin Stopped (07/30/19 0240)  . vasopressin 0.03 Units/min (07/30/19 0700)    PRN  Medications: Place/Maintain arterial line **AND** sodium chloride, sodium chloride, oxyCODONE, sodium chloride flush, traMADol   Assessment/Plan:    1. Refractory Ventricular Fibrillation  -multiple episodes ~10 of VF on 7/28 - now in NSR/brady over the past 24 hours on lido 2, amio 30 - have d/w EP. If recurs will consider putting IABP and TVP in  -  Ranexa and Mexiletine currently on hold (unable to crush and administer through tube)   - continue lidocaine and amio gtt. Once extubated, plan to resume Ranexa 500 mg bid + Mexiletine 200 mg bid   - Keep K > 4.0 and Mg > 2.0. - Supp K (3.1) and Mg (1.9) - bedside echo 7/28 cannula ok  2. Acute Systolic Heart Failure s/p VAD - EF 36%, RV systolic function mildly reduced - HMIII LVAD Placed 6/24 - RV looks good on echo  Cannula ok  - Co-ox 65% - avoid inotropes with VF - INR 3.5 today. Hold coumadin  - Volume up today. CVP 18. IV Lasix 40 mg x 1. Supp K   3. Acute hypoxic resp failure - intubated in setting of VF - will consult CCM for vent management  4. CAD - S/p Acute STEMI 6/21.Emergent cath showed severe multivessel disease w/ 75% mid- distal LM, 100% prox LAD, not amendable to PCI (unable to cross w/ guide wire, probable CTO), 100% prox LCx (probable CTO) and 100% ostial RCA (probable CTO). There were mildto moderate collaterals right to left to the LAD, minimal right to right collateralsan minimalleft to left collaterals.LVEF 25% -Hs trop peaked 27,000. - No targets for CABG / PCI.  - S/P HMIII VAD - Continue ASA + statin   5. Renal mass = RCC - MRI abdomen- 1.8 cm right renal lesion, most consistent with renal cell carcinoma. No evidence of renal vein involvement or nodal metastasis. - Discussed w/ Urology and R RCC is small and will just require surveillance. Ok to proceed with VAD from their standpoint.   6. Urinary retention - Foley placed - UA + for UTI. On abx  7. UTI: + UA 7/28 w/ fever +  leukocytosis. Lactic acid 2.2 - AF today. WBC normalized.  - UC and BC pending  - Continue vanc + cefepime, narrow therapy based on culture data - continue VP + NE for BP support (MAPs 70s by a-line) - repeat Lactic acid to check for clearance    I reviewed the LVAD parameters from today, and compared the results to the patient's prior recorded data.  No programming changes were made.  The LVAD is functioning within specified parameters.  The patient performs LVAD self-test daily.  LVAD interrogation was negative for any significant power changes, alarms or PI events/speed drops.  LVAD equipment check completed and is in good working order.  Back-up equipment present.   LVAD education done on emergency procedures and precautions and reviewed exit site care.  Length of Stay: 2  Lyda Jester, PA-C 07/30/2019, 7:16 AM  VAD Team --- VAD ISSUES ONLY--- Pager [phone] (7am - 7am)  Advanced Heart Failure Team  Pager [phone] (M-F; 7a - 4p)  Please contact [facility] for night-coverage after hours (4p -7a ) and weekends on [email or web address]  Agree with above.  VT/VF quiescent overnight on amio 30 and lido 2. HR remains in low 40s.  Remains intubated on Neo 10 and VP 0.03  General:  Intubated sedated HEENT: normal + ETT Neck: supple. JVP not elevated.  Carotids 2+ bilat; no bruits. No lymphadenopathy or thryomegaly appreciated. Cor: LVAD hum.  Lungs: Clear. Abdomen: obese soft, nontender, non-distended. No hepatosplenomegaly. No bruits or masses. Good bowel sounds. Driveline site clean. Anchor in place.  Extremities: no cyanosis, clubbing, rash. Warm 1+ edema  Neuro: sedated  VT/VF suppressed. Continue amio and lido. Await level. Likely will decrease lido to 1. If more brady will place TVP. Wean pressors. CVP 12. Give lasix 40 IV x 1. Will ask CCM to hel with vent. Suspect he can be extubated soon (would keep him intubated at least one more day to ensure rhythms are quiet). VAD interrogated  personally. Parameters stable.   CRITICAL CARE Performed by: Glori Bickers  Total critical care time: 35 minutes  Critical care time was exclusive of separately billable procedures and treating other patients.  Critical care was necessary to treat or prevent imminent or life-threatening deterioration.  Critical care was time spent personally by me (independent of midlevel providers or residents) on the following activities: development of treatment plan with patient and/or surrogate as well as nursing, discussions with consultants, evaluation of patient's response to treatment, examination of patient, obtaining history from patient or surrogate, ordering and performing treatments and interventions, ordering and review of laboratory studies, ordering and review of radiographic studies, pulse oximetry and re-evaluation of patient's condition.  Glori Bickers, MD  8:06 AM

## 2019-07-30 NOTE — Progress Notes (Addendum)
Progress Note  Patient Name: Aaron [name] Date of Encounter: 07/30/2019  [facility] Cardiologist: Dr. Marlou Porch  Subjective   sedated  Inpatient Medications    Scheduled Meds: . aspirin  81 mg Per Tube Daily  . atorvastatin  80 mg Per Tube Daily  . chlorhexidine gluconate (MEDLINE KIT)  15 mL Mouth Rinse BID  . Chlorhexidine Gluconate Cloth  6 each Topical Daily  . clonazePAM  0.5 mg Per Tube BID  . digoxin  0.125 mg Per Tube Daily  . feeding supplement (PROSource TF)  90 mL Per Tube TID  . folic acid  1 mg Per Tube Daily  . gabapentin  300 mg Per Tube Q8H  . mouth rinse  15 mL Mouth Rinse 10 times per day  . multivitamin with minerals  1 tablet Per Tube Daily  . pantoprazole sodium  40 mg Per Tube Daily  . sildenafil  20 mg Per Tube TID  . sodium chloride flush  10-40 mL Intracatheter Q12H  . thiamine  100 mg Per Tube Daily  . Warfarin - Pharmacist Dosing Inpatient   Does not apply q1600   Continuous Infusions: . sodium chloride    . sodium chloride Stopped (07/30/19 6599)  . amiodarone 30 mg/hr (07/30/19 0700)  . ceFEPime (MAXIPIME) IV Stopped (07/30/19 0600)  . feeding supplement (VITAL AF 1.2 CAL) 30 mL/hr at 07/30/19 0400  . lidocaine 2 mg/min (07/30/19 0700)  . phenylephrine (NEO-SYNEPHRINE) Adult infusion 5 mcg/min (07/30/19 0700)  . potassium chloride 50 mL/hr at 07/30/19 0700  . propofol (DIPRIVAN) infusion 65 mcg/kg/min (07/30/19 0700)  . vancomycin Stopped (07/30/19 0240)  . vasopressin 0.03 Units/min (07/30/19 0700)   PRN Meds: Place/Maintain arterial line **AND** sodium chloride, sodium chloride, oxyCODONE, sodium chloride flush, traMADol   Vital Signs    Vitals:   07/30/19 0615 07/30/19 0630 07/30/19 0645 07/30/19 0700  BP:      Pulse: (!) 36 (!) 39 (!) 38 (!) 36  Resp: _0 Temp: (!) 96.1 F (35.6 C) (!) 96.1 F (35.6 C) (!) 96.1 F (35.6 C) (!) 96.1 F (35.6 C)  TempSrc:      SpO2: 96% 96% 96% 96%  Weight:      Height:          Intake/Output Summary (Last 24 hours) at 07/30/2019 0711 Last data filed at 07/30/2019 0700 Gross per 24 hour  Intake 3952.04 ml  Output 1990 ml  Net 1962.04 ml   Last 3 Weights 07/30/2019 07/05/2019 07/25/2019  Weight (lbs) 196 lb 6.9 oz 189 lb 9.5 oz 189 lb 9.5 oz  Weight (kg) 89.1 kg 86 kg 86 kg      Telemetry    SB 40's, long 1st degree AVblock - Personally Reviewed  ECG    No new EKGs - Personally Reviewed  Physical Exam   GEN: sedated Neck: No JVD Cardiac: VAD hum.  Respiratory: CTA b/l, intubated GI: Soft, non-distended  MS: No edema; No deformity. Neuro:  unable to assess Psych: unable to assess   Labs    High Sensitivity Troponin:   Recent Labs  Lab 07/09/19 0740 07/09/19 0920  TROPONINIHS 3,337* 3,481*      Chemistry Recent Labs  Lab 07/27/19 0400 07/20/2019 0405 07/24/2019 2222 07/10/2019 2222 07/07/2019 0325 07/13/2019 0409 07/30/19 0437  NA 133*   < > 129*   < > 125* 129* 127*  K 4.1   < > 4.6   < > 4.7 5.1 3.1*  CL 101   < > 96*  --  95*  --  95*  CO2 24   < > 22  --  18*  --  21*  GLUCOSE 99   < > 180*  --  282*  --  138*  BUN 13   < > 13  --  13  --  21*  CREATININE 0.97   < > 1.04  --  0.95  --  0.95  CALCIUM 8.5*   < > 8.6*  --  7.4*  --  8.3*  PROT 6.0*  --   --   --   --   --   --   ALBUMIN 2.8*  --   --   --   --   --   --   AST 67*  --   --   --   --   --   --   ALT 104*  --   --   --   --   --   --   ALKPHOS 117  --   --   --   --   --   --   BILITOT 1.0  --   --   --   --   --   --   GFRNONAA >60   < > >60  --  >60  --  >60  GFRAA >60   < > >60  --  >60  --  >60  ANIONGAP 8   < > 11  --  12  --  11   < > = values in this interval not displayed.     Hematology Recent Labs  Lab 07/27/19 0400 07/27/19 0400 07/14/2019 0325 07/26/2019 0409 07/30/19 0437  WBC 8.6  --  14.1*  --  8.6  RBC 3.60*  --  3.15*  --  3.39*  HGB 10.7*   < > 9.7* 10.9* 10.0*  HCT 34.4*   < > 29.8* 32.0* 31.4*  MCV 95.6  --  94.6  --  92.6  MCH 29.7  --   30.8  --  29.5  MCHC 31.1  --  32.6  --  31.8  RDW 16.8*  --  16.9*  --  16.6*  PLT 215  --  160  --  159   < > = values in this interval not displayed.    BNPNo results for input(s): BNP, PROBNP in the last 168 hours.   DDimer No results for input(s): DDIMER in the last 168 hours.   Radiology    DG Abd Portable 1V  Result Date: 07/02/2019 CLINICAL DATA:  Orogastric tube placement. EXAM: PORTABLE ABDOMEN - 1 VIEW COMPARISON:  07/16/2019 chest radiograph. FINDINGS: Significantly artifact degraded exam due to overlying support apparatus, including left ventricular assist device. Nasogastric tube terminates at the body of the stomach. Median sternotomy wires. Prominent gas-filled loops of small bowel in the upper abdomen may represent adynamic ileus. No gross free intraperitoneal air. IMPRESSION: Nasogastric tube terminating at the body of the stomach. Electronically Signed   By: Abigail Miyamoto M.D.   On: 07/26/2019 11:34    Cardiac Studies    07/09/2019: TTE IMPRESSIONS  1. Left ventricular ejection fraction, by estimation, is <20%. The left  ventricle has severely decreased function. The left ventricle demonstrates  global hypokinesis. The left ventricular internal cavity size was mildly  dilated. There is mild left  ventricular hypertrophy.  2. Right ventricular systolic function is mildly reduced. The right  ventricular size is normal.  3. The mitral valve is normal in structure. Trivial mitral valve  regurgitation. No evidence of mitral stenosis.  4. The aortic valve is poorly visualized but does not appear to open.  5. Ramp echo done with speed increased from 5000 rpm to 5100 rpm.   06/17/2019: R/LHC  Mid LM lesion is 75% stenosed.  Ost LAD lesion is 100% stenosed.  Ost Cx to Prox Cx lesion is 100% stenosed.  Ost RCA lesion is 100% stenosed.  Hemodynamic findings consistent with severe pulmonary hypertension.  Intra-aortic balloon pump was placed to help with  hemodynamic stability  Intervention was deferred unable to cross with a wire ostial LAD  Intervention was otherwise deferred in favor of bypass surgery  Conclusion Evidence of moderate to severe pulmonary hypertension Mean wedge of 43 mean PA of 53 Severe dilated cardiomyopathy probably ischemic ejection fraction less than 25% Moderate calcification of the proximal coronary arteries Multiple CTO's ostial LAD proximal circumflex ostial RCA Mild to moderate collaterals right to left to the LAD Minimal right to right collaterals Minimal left to left collaterals Coronaries 75% mid to distal left main 100% ostial LAD probable CTO 100% proximal circumflex probable CTO 100% ostial RCA probable CTO Collaterals as described Intervention was deferred after unable to cross with the wire for what was thought to be suspected CTO's of the ostial LAD Intra-aortic balloon pump was placed right femoral artery to help with hemodynamic stability 7 [place] Swan was placed on left and the PA position to help with hemodynamic monitoring Patient was placed on IV heparin for anticoagulation Arrangements were made for transfer to [facility] for possible ICU CCU care and possible evaluation for coronary bypass surgery Dr. Nechama Guard accepted the patient in transfer to CCU:   Patient Profile     54 y.o. male  w/PMHx of CAD (prior CABG), HTN, ICM, renal mass c/w RCC.  presented 06/18/19 with acute anterolateral STEMI. He underwent emergent cath and was found to have multivessel CAD. PCI of the LAD was unsuccessful (there were no targets for CABG). He required IABP and subsequent VAD for cardiogenic shock. - HM3 placement 6/24  - Had 2 episodes of VF requiring emergent DC-CV on 07/02/19 - Recurrent VF on 7/2 and broke spontaneously.  - 7/3 Recurrent VF at 2a.->emergent DC-CV.  - 07/07/2019 VT/Torsades -> repeat DC_CV - 07/08/2019 VF/Torsades x2-->DC-CV x2.  - 07/09/19 developed sepsis/PNA ->  intubated - 07/09/19 Multiple episodes of VT/VF -> IABP placed. AV pacing via EPW He did eventually improve, transitioned to PO AAD with amiodarone aand mexiletine. IABP was removed, HR improved and EPW removed, ultimately mexiletine was stopped, amio continued and able to be [facility] to [facility] 07/21/2019 Notes report not a transplant candidate given smoker.   Developed VF in [place] and readmitted 07/04/2019 > emergent defibrillation started on amio gtt and mexiletine Overnight developed refractory VF requiring numerous shocks > amio gtt, lido gtt, intubated on pressors  Assessment & Plan    1. Refractory VF     None further since yesterday AM  amio gtt @ 30 Lido gtt @ 2 Neo @ 10 Vasopressin @ 9  Coox 64.9  Hyponatremic Hypokalemic being replaced Mag 1.9 replacement given  Lido level pending (yesterday afternoon was 4.9), likely Aaron [name] need to reduce to 1 today if high  Unable to give Ranexa through tube and d/c for now  The patient was seen this AM with Dr. Curt Bears   For questions or updates, please contact [facility] [name] Please consult [email or web address] for contact info under        Signed, Baldwin [place], PA-C  07/30/2019, 7:11 AM   \  I have seen and examined this patient with Aaron [name].  Agree with above, note added to reflect my findings.  On exam, RRR, LVAD hum.  Patient remains on a lidocaine and amiodarone, though amiodarone dose has been decreased to 30.  He has had no further ventricular arrhythmias.  We Aaron [name] continue with these medications.  If his lidocaine level is elevated, may need to come down to 1.  We Taft Worthing need to start mexiletine and Ranexa upon extubation.  Aaron [name] M. Dimitriy Carreras MD 07/30/2019 7:39 AM

## 2019-07-30 NOTE — Progress Notes (Signed)
LVAD Coordinator Rounding Note:  Transferred from [facility] ED on 06/17/19 with STEMI. Cath revealed multi-vessel CAD, wire would not cross to open LAD; IABP placed and pt transferred here for evaluation for CABG.   Dr. Orvan Seen evaluated and found he was not amenable to surgical or percutaneous revascularization due to poor targets. With severe depressed LV function and little coronary perfusion, LVAD was recommended.   HM III LVAD implanted on 06/25/19 by Dr. Orvan Seen under Destination Therapy criteria due to recent smoking history.  Developed sepsis/PNA with respiratory failure. Had refractory VT/VF. IABP/swan placed 07/09/19 - discontinued 07/14/19  Pt's transferred to CIR on 07/24/19. Had refractory VF 07/31/2019 and transferred back to [place].  Pt asleep, remains intubated. No shocks reported today or overnight. Plan for extubation tomorrow if possible.   Nurse reported no prn Tylenol for fever ordered. Per Dr. Haroldine Laws, since [facility] and urine cultures obtained yesterday, it is OK to give Tylenol. Order placed, BS nurse updated.   Vital signs: Temp: 101.3 HR: 64  Art. Line:  75/65 (70) Doppler:  Not done O2 Sat: 92% on 40% FiO2 5 CPAP Wt: 189.6>196.4lbs  LVAD parameters: Speed:  5200 Flow: 4.4 Power:  3.7w PI: 2.3 Hct: 31  Alarms: none Events: > 110 PI events today   Fixed speed: 5200 Low speed limit: 4900   Drive Line:  CDI. Anchor secure.  Every other day dressing changes per bedside nurse. Next dressing change due: 07/30/19.   Labs:  LDH trend: 326>250  INR trend: 2.1>3.5  Anticoagulation Plan: -INR Goal:  2.0 - 2.5 -ASA Dose: 81 mg - will keep going for CAD for 3 mths stop date 09/24/25  Device: N/A  Drips: - Amiodarone 30 mg/hr - Neo 10 mcg/min - Vasopressin 0.03 units/min - Fentanyl 100 mcg/ml - Lidocaine 1 mg/min   Infection: - 07/16/2019 - BCs>>pending - 07/19/2019 - urine culture>>negative  Arrythmias:  - 08/01/2019 Vfib while in CIR; emergently transferred  back to 2H>> DCCV back to NSR; Amio gtt - 07/30/2019 recurrent VF>>increased amio gtt; Lido bolus/gtt; DC-CV 200J to NSR - 07/24/2019 recurrent VF>>8 separate DC-CV throughout night; required pressors for BP support and re-intubation   Respiratory:  - intubated 07/17/2019    Plan/Recommendations:  1. Call VAD pager if any VAD equipment or drive line issues. 2. Every other day dressing changes per bedside nurse  Zada Girt RN [place] Coordinator  Office: [phone]  24/7 Pager: [phone]

## 2019-07-30 NOTE — Consult Note (Signed)
NAME:  Aaron [name], MRN:  [id number], DOB:  1965/07/28, LOS: 2 ADMISSION DATE:  07/08/2019, CONSULTATION DATE:  07/30/19 REFERRING MD:  Heart Failure, CHIEF COMPLAINT:  Respiratory failure -- VF, LVAD   Brief History   54 yo M s/p LVAD who is back in CVICU after development of VF requiring emergent DCCV 7/27. Several recurrences 7/28. Remains intubated 7/29 and PCCM consulted for vent management   History of present illness   54 yo PMH acute systolic heart failure s/p LVAD (06/25/19), recurrent VT/VF/Torsades, multivessel CAD, R renal mass c/f RCC, suspected OSA, who presents back to ICU after prolonged hospital course after which he was transferred to [facility] 7/23. At CIR, pt developed dyspnea, hypoxia following PT 7/27. ECG obtained and reveals VF rate 210 bpm. Transferred to CVICU 7/27 for emergent DCCV. Intubated by anesthesia. DCCV performed numerous times 7/27, 7/28. Remains intubated 7/29.  PCCM consulted 7/29 for vent management    Past Medical History  Acute systolic heart failure s/p LVAD CAD Recurrent VF, VT, torsades  Renal mass Debility Sleep apnea   Significant Hospital Events   6/16 admitted with acute anterolateral STEMI, cardiogenic shock requiring IABP 6/25 LVAD placement 7/3, 7/6, 7/7: recurrent VT/VT/Torsades requiring multiple emergent DCCV  7/8 PNA, respiratory failure, required intubation 7/13 IABP removed, extubated 7/23 discharred to CIR 7/27 recurrent VF, admit to CVICU for DCCV. Intubated 7/28 recurrent VF, DCCV multiple times 7/29 PCCM consult for vent   Consults:  EP PCCM  Procedures:  7/27 ETT> 7/28 Art line>  Significant Diagnostic Tests:  7/29 CXR >>  Micro Data:  7/28 MRSA> neg 7/28 BCx>> 7/28 UCx>>  7/29 tracheal aspirate >>   Antimicrobials:  7/27 vanc> 7/27 cefepime>   Interim history/subjective:  Remains intubated, sedated on prop gtt  On lido, amio, vaso, neo gtt   Objective   Blood pressure 91/78, pulse (!) 41,  temperature (!) 96.8 F (36 C), temperature source Bladder, resp. rate 17, height 5\' 9"  (1.753 m), weight 89.1 kg, SpO2 97 %. CVP:  [11 mmHg-19 mmHg] 12 mmHg  Vent Mode: PRVC FiO2 (%):  [50 %] 50 % Set Rate:  [18 bmp] 18 bmp Vt Set:  [560 mL-570 mL] 560 mL PEEP:  [5 cmH20] 5 cmH20 Plateau Pressure:  [21 cmH20-24 cmH20] 21 cmH20   Intake/Output Summary (Last 24 hours) at 07/30/2019 4944 Last data filed at 07/30/2019 0800 Gross per 24 hour  Intake 3772.68 ml  Output 2045 ml  Net 1727.68 ml   Filed Weights   07/30/2019 2030 07/30/19 0500  Weight: 86 kg 89.1 kg    Examination: General: Critically ill appearing, middle aged M, sedated, on pressors, with LVAD, NAD HENT: NCAT, pink mm, trachea midline, ETT secure OGt secure  Lungs: CTA anteriorly. Symmetrical chest expansion, mechanically ventilated breaths  Cardiovascular: Bradycardic rate. LVAD hum audible. Cap refill < 3 seconds  Abdomen: Soft round ndnt + bowel sounds  Extremities: BUE mild dependent edema. Symmetrical bulk and tone without obvious joint deformity. No cyanosis or clubbing  Neuro: Sedated on 65 prop, paused for exam. Pupils 44mm. Grimace to pain. Does not follow commands GU: Foley  Skin: c/d/w without rash   Resolved Hospital Problem list     Assessment & Plan:   Acute respiratory insufficiency requiring intubation in setting of recurrent VF requiring repeated DCCV -intubated 7/27 P -CXR, ABG -VAP -Changing sedation from prop gtt to fentanyl  -WUA/SBT tomorrow 7/30 -- discussed with primary team, for 7/29 will keep sedated and not pursue  SBT   Recurrent VFib requiring repeated DCCV -initial admission, numerous episopdes of recurrent VT/VF/Torsades requiring DVVc.  -Recurrence 7/27 prompting transfer from CIR to CVICU. Several episodes requiring DCCV 7/27, 7/28 P -EP is following -on lido, amio gtts -Enteral dig -continue to optimize electrolytes   Shock Acute systolic heart failure s/p LVAD placement  (06/25/19) P -per advanced heart failure  -on vaso, neo  Sepsis due to UTI -T 100.4, new mild leukocytosis 14.1 on 7/27 -BCx UCx sent, started on vanc/ cefepime -no further fever, leukocytosis improved P -MRSA neg, dc vanc -will send tracheal aspirate -follow up cx data -continue cefepime, de-escalate vs augment based on cx data  Hypokalemia -Low-normal Magnesium  Hyponatremia P -active replacement ongoing  -trend BMP, mag, phos, iCal   Coagulopathy, on warfarin -dosing per pharmacy   Best practice:  Diet: EN per RDN  Pain/Anxiety/Delirium protocol (if indicated): changing prof gtt to fent gtt + PRN BZD  VAP protocol (if indicated): yes DVT prophylaxis: warfarin per pharmacy  GI prophylaxis: protonix  Glucose control: monitor  Mobility: BR - -will need PT/OT, likely CIR again when critical illness has improved  Code Status: Full  Family Communication: Per primary Disposition: ICU   Labs   CBC: Recent Labs  Lab 07/24/19 0302 07/27/19 0400 07/27/2019 0325 07/31/2019 0409 07/30/19 0437  WBC 8.9 8.6 14.1*  --  8.6  NEUTROABS  --  6.3  --   --   --   HGB 11.8* 10.7* 9.7* 10.9* 10.0*  HCT 38.0* 34.4* 29.8* 32.0* 31.4*  MCV 93.8 95.6 94.6  --  92.6  PLT 259 215 160  --  229    Basic Metabolic Panel: Recent Labs  Lab 07/25/19 1514 07/26/19 0423 07/27/19 0400 07/27/19 0400 07/22/2019 0405 07/09/2019 2222 07/21/2019 0325 07/06/2019 0409 07/11/2019 1742 07/30/19 0437  NA   < >  --  133*   < > 134* 129* 125* 129*  --  127*  K   < >  --  4.1   < > 4.3 4.6 4.7 5.1  --  3.1*  CL   < >  --  101  --  101 96* 95*  --   --  95*  CO2   < >  --  24  --  23 22 18*  --   --  21*  GLUCOSE   < >  --  99  --  118* 180* 282*  --   --  138*  BUN   < >  --  13  --  14 13 13   --   --  21*  CREATININE   < >  --  0.97  --  1.02 1.04 0.95  --   --  0.95  CALCIUM   < >  --  8.5*  --  8.7* 8.6* 7.4*  --   --  8.3*  MG  --  1.8 2.1  --   --  1.8 2.4  --   --  1.9  PHOS  --   --   --   --    --   --   --   --  4.5 3.6   < > = values in this interval not displayed.   GFR: Estimated Creatinine Clearance: 97.1 mL/min (by C-G formula based on SCr of 0.95 mg/dL). Recent Labs  Lab 07/24/19 0302 07/27/19 0400 08/01/2019 0325 07/22/2019 0329 07/22/2019 0705 07/30/19 0437  WBC 8.9 8.6 14.1*  --   --  8.6  LATICACIDVEN  --   --   --  1.5 2.2*  --     Liver Function Tests: Recent Labs  Lab 07/27/19 0400  AST 67*  ALT 104*  ALKPHOS 117  BILITOT 1.0  PROT 6.0*  ALBUMIN 2.8*   No results for input(s): LIPASE, AMYLASE in the last 168 hours. No results for input(s): AMMONIA in the last 168 hours.  ABG    Component Value Date/Time   PHART 7.425 07/25/2019 0409   PCO2ART 37.5 07/26/2019 0409   PO2ART 272 (H) 07/17/2019 0409   HCO3 24.6 07/28/2019 0409   TCO2 26 07/22/2019 0409   ACIDBASEDEF 2.0 07/14/2019 0507   O2SAT 64.8 07/30/2019 0437     Coagulation Profile: Recent Labs  Lab 07/26/19 0423 07/27/19 0400 07/09/2019 0405 07/21/2019 0705 07/30/19 0437  INR 2.0* 1.5* 1.4* 2.1* 3.5*    Cardiac Enzymes: No results for input(s): CKTOTAL, CKMB, CKMBINDEX, TROPONINI in the last 168 hours.  HbA1C: Hgb A1c MFr Bld  Date/Time Value Ref Range Status  06/22/2019 11:59 AM 5.4 4.8 - 5.6 % Final    Comment:    (NOTE)         Prediabetes: 5.7 - 6.4         Diabetes: >6.4         Glycemic control for adults with diabetes: <7.0     CBG: Recent Labs  Lab 07/03/2019 1459 07/28/2019 1926 07/02/2019 2357 07/30/19 0434 07/30/19 0720  GLUCAP 133* 135* 131* 131* 152*    Review of Systems:   Unable to obtain due to sedation  Past Medical History  He,  has a past medical history of Acute anterolateral wall MI ([place]), CAD, multiple vessel, Hypertension, Ischemic cardiomyopathy, Systolic heart failure ([place]), and Tobacco abuse.   Surgical History    Past Surgical History:  Procedure Laterality Date  . CARDIOVERSION N/A 07/02/2019   Procedure: CARDIOVERSION;  Surgeon: Jolaine Artist, MD;  Location: [facility] OR;  Service: Cardiovascular;  Laterality: N/A;  . CORONARY/GRAFT ACUTE MI REVASCULARIZATION N/A 06/17/2019   Procedure: Coronary/Graft Acute MI Revascularization;  Surgeon: Yolonda Kida, MD;  Location: [place] CV LAB;  Service: Cardiovascular;  Laterality: N/A;  . IABP INSERTION N/A 06/17/2019   Procedure: IABP Insertion;  Surgeon: Yolonda Kida, MD;  Location: [place] CV LAB;  Service: Cardiovascular;  Laterality: N/A;  . IABP INSERTION N/A 06/24/2019   Procedure: IABP INSERTION;  Surgeon: Jolaine Artist, MD;  Location: [place] CV LAB;  Service: Cardiovascular;  Laterality: N/A;  . IABP INSERTION N/A 07/09/2019   Procedure: IABP Insertion;  Surgeon: Jolaine Artist, MD;  Location: [place] CV LAB;  Service: Cardiovascular;  Laterality: N/A;  . INSERTION OF IMPLANTABLE LEFT VENTRICULAR ASSIST DEVICE N/A 06/25/2019   Procedure: INSERTION OF IMPLANTABLE LEFT VENTRICULAR ASSIST DEVICE USING THORATEC HEARTMATE 3;  Surgeon: Wonda Olds, MD;  Location: [place];  Service: Open Heart Surgery;  Laterality: N/A;  . LEFT HEART CATH AND CORONARY ANGIOGRAPHY N/A 06/17/2019   Procedure: LEFT HEART CATH AND CORONARY ANGIOGRAPHY;  Surgeon: Yolonda Kida, MD;  Location: [place] CV LAB;  Service: Cardiovascular;  Laterality: N/A;  . RIGHT HEART CATH N/A 06/17/2019   Procedure: RIGHT HEART CATH;  Surgeon: Yolonda Kida, MD;  Location: [place] CV LAB;  Service: Cardiovascular;  Laterality: N/A;  . RIGHT HEART CATH N/A 06/24/2019   Procedure: RIGHT HEART CATH;  Surgeon: Jolaine Artist, MD;  Location: [place] CV LAB;  Service: Cardiovascular;  Laterality: N/A;  . RIGHT HEART CATH N/A 07/09/2019   Procedure: RIGHT HEART CATH;  Surgeon: Jolaine Artist, MD;  Location: [place] CV LAB;  Service: Cardiovascular;  Laterality: N/A;  . TEE WITHOUT CARDIOVERSION N/A 06/25/2019   Procedure: TRANSESOPHAGEAL ECHOCARDIOGRAM (TEE);   Surgeon: Wonda Olds, MD;  Location: [place];  Service: Open Heart Surgery;  Laterality: N/A;     Social History   reports that he has been smoking. He has a 45.00 pack-year smoking history. He has never used smokeless tobacco. He reports current alcohol use of about 7.0 - 14.0 standard drinks of alcohol per week. He reports that he does not use drugs.   Family History   His family history includes CAD in his father; Valvular heart disease in his father.   Allergies Allergies  Allergen Reactions  . Codeine Hives     Home Medications  Prior to Admission medications   Medication Sig Start Date End Date Taking? Authorizing Provider  acetaminophen (TYLENOL) 325 MG tablet Take 2 tablets (650 mg total) by mouth every 4 (four) hours as needed for headache or mild pain. 07/25/2019   Angiulli, Lavon Paganini, PA-C  ALPRAZolam Duanne Moron) 0.25 MG tablet Take 1 tablet (0.25 mg total) by mouth 3 (three) times daily as needed for anxiety. 07/17/2019   Angiulli, Lavon Paganini, PA-C  amiodarone (PACERONE) 400 MG tablet Take 1 tablet (400 mg total) by mouth 2 (two) times daily. 07/23/19   Consuelo Pandy, PA-C  aspirin 81 MG chewable tablet Chew 1 tablet (81 mg total) by mouth daily. 07/23/19   Lyda Jester M, PA-C  atorvastatin (LIPITOR) 80 MG tablet Take 1 tablet (80 mg total) by mouth daily. 07/23/19   Lyda Jester M, PA-C  clonazePAM (KLONOPIN) 0.5 MG tablet Take 1 tablet (0.5 mg total) by mouth 2 (two) times daily. 07/17/2019   Angiulli, Lavon Paganini, PA-C  digoxin (LANOXIN) 0.125 MG tablet Take 1 tablet (0.125 mg total) by mouth daily. 07/23/19   Lyda Jester M, PA-C  folic acid (FOLVITE) 1 MG tablet Take 1 tablet (1 mg total) by mouth daily. 07/23/19   Lyda Jester M, PA-C  gabapentin (NEURONTIN) 600 MG tablet Take 0.5 tablets (300 mg total) by mouth 3 (three) times daily. 07/23/19   Lyda Jester M, PA-C  melatonin 5 MG TABS Take 1 tablet (5 mg total) by mouth daily after supper. 07/04/2019    Angiulli, Lavon Paganini, PA-C  Multiple Vitamin (MULTIVITAMIN WITH MINERALS) TABS tablet Take 1 tablet by mouth daily. 07/06/2019   Angiulli, Lavon Paganini, PA-C  oxyCODONE (OXY IR/ROXICODONE) 5 MG immediate release tablet Take 1-2 tablets (5-10 mg total) by mouth every 3 (three) hours as needed for severe pain. 07/13/2019   Angiulli, Lavon Paganini, PA-C  pantoprazole (PROTONIX) 40 MG tablet Take 1 tablet (40 mg total) by mouth daily. 07/23/19   Lyda Jester M, PA-C  polyvinyl alcohol (LIQUIFILM TEARS) 1.4 % ophthalmic solution Place 1 drop into both eyes as needed for dry eyes. 07/24/2019   Angiulli, Lavon Paganini, PA-C  ranolazine (RANEXA) 500 MG 12 hr tablet Take 1 tablet (500 mg total) by mouth 2 (two) times daily. 07/23/19   Lyda Jester M, PA-C  sildenafil (REVATIO) 20 MG tablet Take 1 tablet (20 mg total) by mouth 3 (three) times daily. 07/24/19   Lyda Jester M, PA-C  thiamine 100 MG tablet Take 1 tablet (100 mg total) by mouth daily. 07/23/19   Consuelo Pandy, PA-C  traMADol Veatrice Bourbon)  50 MG tablet Take 1-2 tablets (50-100 mg total) by mouth every 4 (four) hours as needed for moderate pain. 07/25/2019   Angiulli, Lavon Paganini, PA-C  traZODone (DESYREL) 100 MG tablet Take 1 tablet (100 mg total) by mouth at bedtime as needed for sleep. 07/13/2019   Angiulli, Lavon Paganini, PA-C  warfarin (COUMADIN) 6 MG tablet Take 1 tablet (6 mg total) by mouth one time only at 4 PM. 07/07/2019   Angiulli, Lavon Paganini, PA-C     Critical care time: 45 minutes    CRITICAL CARE Performed by: Cristal Generous   Total critical care time: 45 minutes  Critical care time was exclusive of separately billable procedures and treating other patients. Critical care was necessary to treat or prevent imminent or life-threatening deterioration.  Critical care was time spent personally by me on the following activities: development of treatment plan with patient and/or surrogate as well as nursing, discussions with consultants, evaluation of  patient's response to treatment, examination of patient, obtaining history from patient or surrogate, ordering and performing treatments and interventions, ordering and review of laboratory studies, ordering and review of radiographic studies, pulse oximetry and re-evaluation of patient's condition.  Aaron Gum MSN, AGACNP-BC [facility] [phone] If no answer, [phone] 07/30/2019, 10:16 AM

## 2019-07-31 ENCOUNTER — Inpatient Hospital Stay (HOSPITAL_COMMUNITY): Admission: AD | Disposition: E | Payer: Self-pay | Source: Ambulatory Visit | Attending: Internal Medicine

## 2019-07-31 ENCOUNTER — Encounter (HOSPITAL_COMMUNITY): Payer: Self-pay | Admitting: Internal Medicine

## 2019-07-31 ENCOUNTER — Inpatient Hospital Stay (HOSPITAL_COMMUNITY): Payer: BC Managed Care – PPO

## 2019-07-31 DIAGNOSIS — I4901 Ventricular fibrillation: Secondary | ICD-10-CM

## 2019-07-31 DIAGNOSIS — R570 Cardiogenic shock: Secondary | ICD-10-CM

## 2019-07-31 HISTORY — PX: TEMPORARY PACEMAKER: CATH118268

## 2019-07-31 HISTORY — PX: IABP INSERTION: CATH118242

## 2019-07-31 LAB — BASIC METABOLIC PANEL
Anion gap: 11 (ref 5–15)
BUN: 28 mg/dL — ABNORMAL HIGH (ref 6–20)
CO2: 19 mmol/L — ABNORMAL LOW (ref 22–32)
Calcium: 8 mg/dL — ABNORMAL LOW (ref 8.9–10.3)
Chloride: 97 mmol/L — ABNORMAL LOW (ref 98–111)
Creatinine, Ser: 1.2 mg/dL (ref 0.61–1.24)
GFR calc Af Amer: >60 mL/min (ref >60)
GFR calc non Af Amer: >60 mL/min (ref >60)
Glucose, Bld: 138 mg/dL — ABNORMAL HIGH (ref 70–99)
Potassium: 3.5 mmol/L (ref 3.5–5.1)
Sodium: 127 mmol/L — ABNORMAL LOW (ref 135–145)

## 2019-07-31 LAB — POCT I-STAT 7, (LYTES, BLD GAS, ICA,H+H)
Acid-base deficit: 2 mmol/L (ref 0.0–2.0)
Acid-base deficit: 3 mmol/L — ABNORMAL HIGH (ref 0.0–2.0)
Acid-base deficit: 4 mmol/L — ABNORMAL HIGH (ref 0.0–2.0)
Acid-base deficit: 3 mmol/L — ABNORMAL HIGH (ref 0.0–2.0)
Bicarbonate: 22.4 mmol/L (ref 20.0–28.0)
Bicarbonate: 21.3 mmol/L (ref 20.0–28.0)
Bicarbonate: 21.2 mmol/L (ref 20.0–28.0)
Bicarbonate: 21.3 mmol/L (ref 20.0–28.0)
Calcium, Ion: 1.11 mmol/L — ABNORMAL LOW (ref 1.15–1.40)
Calcium, Ion: 1.08 mmol/L — ABNORMAL LOW (ref 1.15–1.40)
Calcium, Ion: 1.13 mmol/L — ABNORMAL LOW (ref 1.15–1.40)
Calcium, Ion: 1.12 mmol/L — ABNORMAL LOW (ref 1.15–1.40)
HCT: 32 % — ABNORMAL LOW (ref 39.0–52.0)
HCT: 31 % — ABNORMAL LOW (ref 39.0–52.0)
HCT: 33 % — ABNORMAL LOW (ref 39.0–52.0)
HCT: 31 % — ABNORMAL LOW (ref 39.0–52.0)
Hemoglobin: 10.9 g/dL — ABNORMAL LOW (ref 13.0–17.0)
Hemoglobin: 10.5 g/dL — ABNORMAL LOW (ref 13.0–17.0)
Hemoglobin: 11.2 g/dL — ABNORMAL LOW (ref 13.0–17.0)
Hemoglobin: 10.5 g/dL — ABNORMAL LOW (ref 13.0–17.0)
O2 Saturation: 95 %
O2 Saturation: 99 %
O2 Saturation: 88 %
O2 Saturation: 98 %
Patient temperature: 37.2
Patient temperature: 38.3
Patient temperature: 38.5
Patient temperature: 38.2
Potassium: 3.5 mmol/L (ref 3.5–5.1)
Potassium: 3.8 mmol/L (ref 3.5–5.1)
Potassium: 4 mmol/L (ref 3.5–5.1)
Potassium: 4 mmol/L (ref 3.5–5.1)
Sodium: 129 mmol/L — ABNORMAL LOW (ref 135–145)
Sodium: 129 mmol/L — ABNORMAL LOW (ref 135–145)
Sodium: 131 mmol/L — ABNORMAL LOW (ref 135–145)
Sodium: 132 mmol/L — ABNORMAL LOW (ref 135–145)
TCO2: 23 mmol/L (ref 22–32)
TCO2: 22 mmol/L (ref 22–32)
TCO2: 22 mmol/L (ref 22–32)
TCO2: 22 mmol/L (ref 22–32)
pCO2 arterial: 34.9 mmHg (ref 32.0–48.0)
pCO2 arterial: 35.7 mmHg (ref 32.0–48.0)
pCO2 arterial: 39.5 mmHg (ref 32.0–48.0)
pCO2 arterial: 38.4 mmHg (ref 32.0–48.0)
pH, Arterial: 7.416 (ref 7.350–7.450)
pH, Arterial: 7.388 (ref 7.350–7.450)
pH, Arterial: 7.345 — ABNORMAL LOW (ref 7.350–7.450)
pH, Arterial: 7.357 (ref 7.350–7.450)
pO2, Arterial: 74 mmHg — ABNORMAL LOW (ref 83.0–108.0)
pO2, Arterial: 149 mmHg — ABNORMAL HIGH (ref 83.0–108.0)
pO2, Arterial: 63 mmHg — ABNORMAL LOW (ref 83.0–108.0)
pO2, Arterial: 112 mmHg — ABNORMAL HIGH (ref 83.0–108.0)

## 2019-07-31 LAB — GLUCOSE, CAPILLARY
Glucose-Capillary: 126 mg/dL — ABNORMAL HIGH (ref 70–99)
Glucose-Capillary: 129 mg/dL — ABNORMAL HIGH (ref 70–99)
Glucose-Capillary: 140 mg/dL — ABNORMAL HIGH (ref 70–99)
Glucose-Capillary: 115 mg/dL — ABNORMAL HIGH (ref 70–99)
Glucose-Capillary: 142 mg/dL — ABNORMAL HIGH (ref 70–99)

## 2019-07-31 LAB — LACTATE DEHYDROGENASE: LDH: 256 U/L — ABNORMAL HIGH (ref 98–192)

## 2019-07-31 LAB — CBC
HCT: 32.2 % — ABNORMAL LOW (ref 39.0–52.0)
Hemoglobin: 10 g/dL — ABNORMAL LOW (ref 13.0–17.0)
MCH: 28.6 pg (ref 26.0–34.0)
MCHC: 31.1 g/dL (ref 30.0–36.0)
MCV: 92 fL (ref 80.0–100.0)
Platelets: 176 10*3/uL (ref 150–400)
RBC: 3.5 MIL/uL — ABNORMAL LOW (ref 4.22–5.81)
RDW: 16.8 % — ABNORMAL HIGH (ref 11.5–15.5)
WBC: 11.4 10*3/uL — ABNORMAL HIGH (ref 4.0–10.5)
nRBC: 0 % (ref 0.0–0.2)

## 2019-07-31 LAB — PHOSPHORUS: Phosphorus: 3.7 mg/dL (ref 2.5–4.6)

## 2019-07-31 LAB — COOXEMETRY PANEL
Carboxyhemoglobin: 1.5 % (ref 0.5–1.5)
Methemoglobin: 1.3 % (ref 0.0–1.5)
O2 Saturation: 62.1 %
Total hemoglobin: 10.6 g/dL — ABNORMAL LOW (ref 12.0–16.0)

## 2019-07-31 LAB — PROTIME-INR
INR: 2.2 — ABNORMAL HIGH (ref 0.8–1.2)
INR: 2 — ABNORMAL HIGH (ref 0.8–1.2)
Prothrombin Time: 23.5 seconds — ABNORMAL HIGH (ref 11.4–15.2)
Prothrombin Time: 21.8 seconds — ABNORMAL HIGH (ref 11.4–15.2)

## 2019-07-31 LAB — MAGNESIUM: Magnesium: 2 mg/dL (ref 1.7–2.4)

## 2019-07-31 LAB — LIDOCAINE LEVEL: Lidocaine Lvl: 4 ug/mL (ref 1.5–5.0)

## 2019-07-31 SURGERY — TEMPORARY PACEMAKER
Anesthesia: LOCAL

## 2019-07-31 MED ORDER — AMIODARONE IV BOLUS ONLY 150 MG/100ML
150.0000 mg | Freq: Once | INTRAVENOUS | Status: AC
  Administered 2019-07-31: 150 mg via INTRAVENOUS

## 2019-07-31 MED ORDER — HEPARIN SODIUM (PORCINE) 1000 UNIT/ML IJ SOLN
INTRAMUSCULAR | Status: DC | PRN
  Administered 2019-07-31: 2000 [IU] via INTRAVENOUS

## 2019-07-31 MED ORDER — MAGNESIUM SULFATE 2 GM/50ML IV SOLN
2.0000 g | Freq: Once | INTRAVENOUS | Status: AC
  Administered 2019-07-31: 2 g via INTRAVENOUS
  Filled 2019-07-31: qty 50

## 2019-07-31 MED ORDER — POTASSIUM CHLORIDE 20 MEQ/15ML (10%) PO SOLN
40.0000 meq | Freq: Once | ORAL | Status: AC
  Administered 2019-07-31: 40 meq
  Filled 2019-07-31: qty 30

## 2019-07-31 MED ORDER — FREE WATER
20.0000 mL | Status: DC
  Administered 2019-08-01 – 2019-08-13 (×72): 20 mL

## 2019-07-31 MED ORDER — ADULT MULTIVITAMIN LIQUID CH
15.0000 mL | Freq: Every day | ORAL | Status: DC
  Administered 2019-07-31 – 2019-08-19 (×20): 15 mL
  Filled 2019-07-31 (×21): qty 15

## 2019-07-31 MED ORDER — HEPARIN SODIUM (PORCINE) 1000 UNIT/ML IJ SOLN
INTRAMUSCULAR | Status: AC
  Filled 2019-07-31: qty 1

## 2019-07-31 MED ORDER — SODIUM CHLORIDE 0.9 % IV SOLN: INTRAVENOUS | Status: DC

## 2019-07-31 MED ORDER — HEPARIN (PORCINE) IN NACL 1000-0.9 UT/500ML-% IV SOLN
INTRAVENOUS | Status: AC
  Filled 2019-07-31: qty 1500

## 2019-07-31 MED ORDER — DOCUSATE SODIUM 50 MG/5ML PO LIQD
100.0000 mg | Freq: Two times a day (BID) | ORAL | Status: DC
  Administered 2019-07-31 – 2019-08-16 (×20): 100 mg
  Filled 2019-07-31 (×23): qty 10

## 2019-07-31 MED ORDER — LIDOCAINE HCL (PF) 1 % IJ SOLN
INTRAMUSCULAR | Status: DC | PRN
  Administered 2019-07-31: 10 mL

## 2019-07-31 MED ORDER — CHLORHEXIDINE GLUCONATE CLOTH 2 % EX PADS
6.0000 | MEDICATED_PAD | Freq: Every day | CUTANEOUS | Status: DC
  Administered 2019-08-01 – 2019-08-03 (×4): 6 via TOPICAL

## 2019-07-31 MED ORDER — SODIUM CHLORIDE 0.9 % IV SOLN
INTRAVENOUS | Status: AC | PRN
  Administered 2019-07-31: 10 mL/h via INTRAVENOUS

## 2019-07-31 MED ORDER — ACETAMINOPHEN 160 MG/5ML PO SOLN
1000.0000 mg | Freq: Four times a day (QID) | ORAL | Status: DC | PRN
  Administered 2019-07-31 – 2019-08-16 (×17): 1000 mg
  Filled 2019-07-31 (×21): qty 40.6

## 2019-07-31 MED ORDER — LIDOCAINE BOLUS VIA INFUSION
50.0000 mg | Freq: Once | INTRAVENOUS | Status: AC
  Administered 2019-07-31: 50 mg via INTRAVENOUS
  Filled 2019-07-31: qty 52

## 2019-07-31 MED ORDER — HEPARIN (PORCINE) 25000 UT/250ML-% IV SOLN
1150.0000 [IU]/h | INTRAVENOUS | Status: DC
  Administered 2019-07-31: 500 [IU]/h via INTRAVENOUS
  Administered 2019-08-02: 900 [IU]/h via INTRAVENOUS
  Administered 2019-08-03 – 2019-08-06 (×4): 1150 [IU]/h via INTRAVENOUS
  Filled 2019-07-31 (×6): qty 250

## 2019-07-31 MED ORDER — LIDOCAINE HCL (PF) 1 % IJ SOLN
INTRAMUSCULAR | Status: AC
  Filled 2019-07-31: qty 30

## 2019-07-31 MED ORDER — HEPARIN (PORCINE) IN NACL 1000-0.9 UT/500ML-% IV SOLN
INTRAVENOUS | Status: DC | PRN
  Administered 2019-07-31 (×2): 500 mL

## 2019-07-31 SURGICAL SUPPLY — 13 items
BALLN IABP SENSA PLUS 8F 50CC (BALLOONS) ×2
BALLOON IABP SENS PLUS 8F 50CC (BALLOONS) ×1 IMPLANT
CABLE ADAPT PACING TEMP 12FT (ADAPTER) ×2 IMPLANT
CATH S G BIP PACING (CATHETERS) ×2 IMPLANT
HOVERMATT SINGLE USE (MISCELLANEOUS) ×2 IMPLANT
PACK CARDIAC CATHETERIZATION (CUSTOM PROCEDURE TRAY) ×2 IMPLANT
PROTECTION STATION PRESSURIZED (MISCELLANEOUS) ×2
SHEATH PINNACLE 5F 10CM (SHEATH) ×2 IMPLANT
SHEATH PINNACLE 6F 10CM (SHEATH) ×2 IMPLANT
SHEATH PROBE COVER 6X72 (BAG) ×2 IMPLANT
SLEEVE REPOSITIONING LENGTH 30 (MISCELLANEOUS) ×2 IMPLANT
STATION PROTECTION PRESSURIZED (MISCELLANEOUS) ×1 IMPLANT
WIRE EMERALD 3MM-J .035X150CM (WIRE) ×2 IMPLANT

## 2019-07-31 NOTE — Progress Notes (Signed)
Pt transferred to cath lab and went back into VT/vfib.  Shock x2 with 200j before and during transport.

## 2019-07-31 NOTE — Progress Notes (Addendum)
Advanced Heart Failure VAD Team Note  PCP-Cardiologist: No primary care provider on file.   Subjective:    Had refractory VF throughout the night 7/28 w/ ~10 shocks. Now intubated.   Rhythm remained stable all day yesterday. Lidocaine gtt was reduced down to 1 mg given lidocaine level 5.0 ug/mL. Amio reduced to 30 mg w/ bradycardia.   Had recurrent VF ~5am today requiring defibrillation x 1.  Went back into AF ~7:30. Lidocaine rebolused and defibrillated x 1 back to NSR Maintained NSR for only ~20 min. Back in VF. Amio rebolused and gtt increased to 60 mg/hr.    K 3.5  Mg 2.0  Being treated for ? Septic shock. UA 7/28 + for UTI. Became febrile w/ leukocytosis. BC and UC NGTD. On Cefepime. Vanc discontinued by PCCM. Lactic acid trending down 2.2>>1.9. mTemp overnight 101. WBC down, 14>>11.   Off NE and VP. Co-ox 62%  CVP 18  LVAD INTERROGATION:  HeartMate 3 LVAD:   Flow 4.3  liters/min, speed 5200, power 3.7 PI 3.1 (lowest 1.9 while in VF)  Objective:    Vital Signs:   Temp:  [96.8 F (36 C)-101.3 F (38.5 C)] 100.2 F (37.9 C) (07/30 0700) Pulse Rate:  [41-146] 69 (07/30 0700) Resp:  [11-26] 15 (07/30 0700) BP: (82-100)/(73-86) 87/77 (07/30 0700) SpO2:  [87 %-99 %] 90 % (07/30 0724) Arterial Line BP: (66-110)/(56-93) 91/80 (07/30 0700) FiO2 (%):  [40 %-60 %] 60 % (07/30 0724) Weight:  [91.1 kg] 91.1 kg (07/30 0500) Last BM Date: 07/27/19 Mean arterial Pressure 70-80s  Intake/Output:   Intake/Output Summary (Last 24 hours) at 07/02/2019 0756 Last data filed at 07/24/2019 0700 Gross per 24 hour  Intake 3270.9 ml  Output 3020 ml  Net 250.9 ml     Physical Exam    CVP 20 General:  Intubated and sedated  HEENT: normal + ETT Neck: supple. JVP ~18 cm Carotids 2+ bilat; no bruits. No lymphadenopathy or thyromegaly appreciated. Cor: + Mechanical heart sounds with LVAD hum present. Lungs: intubated, clear  Abdomen: soft, nontender, nondistended. No  hepatosplenomegaly. No bruits or masses. Good bowel sounds. Driveline: C/D/I; securement device intact and driveline incorporated Extremities: no cyanosis, clubbing, rash, edema Neuro: intubated and sedated GU: + Foley    Telemetry   Several episodes of VF requiring defibrillation. Now NSR, 75 bpm.   Labs   Basic Metabolic Panel: Recent Labs  Lab 07/27/19 0400 07/23/2019 0405 07/11/2019 2222 07/11/2019 2222 07/23/2019 0325 07/14/2019 0325 07/04/2019 0409 07/06/2019 1742 07/30/19 0437 07/30/19 1047 07/30/19 1235 07/30/19 1730 07/21/2019 0315 07/27/2019 0322  NA 133*   < > 129*   < > 125*   < >   < >  --  127* 128* 124*  --  127* 129*  K 4.1   < > 4.6   < > 4.7   < >   < >  --  3.1* 4.0 3.8  --  3.5 3.5  CL 101   < > 96*  --  95*  --   --   --  95*  --  95*  --  97*  --   CO2 24   < > 22  --  18*  --   --   --  21*  --  19*  --  19*  --   GLUCOSE 99   < > 180*  --  282*  --   --   --  138*  --  125*  --  138*  --   BUN 13   < > 13  --  13  --   --   --  21*  --  24*  --  28*  --   CREATININE 0.97   < > 1.04  --  0.95  --   --   --  0.95  --  1.01  --  1.20  --   CALCIUM 8.5*   < > 8.6*   < > 7.4*   < >  --   --  8.3*  --  7.9*  --  8.0*  --   MG 2.1  --  1.8  --  2.4  --   --   --  1.9  --   --   --  2.0  --   PHOS  --   --   --   --   --   --   --  4.5 3.6  --   --  3.4 3.7  --    < > = values in this interval not displayed.    Liver Function Tests: Recent Labs  Lab 07/27/19 0400  AST 67*  ALT 104*  ALKPHOS 117  BILITOT 1.0  PROT 6.0*  ALBUMIN 2.8*   No results for input(s): LIPASE, AMYLASE in the last 168 hours. No results for input(s): AMMONIA in the last 168 hours.  CBC: Recent Labs  Lab 07/27/19 0400 07/27/19 0400 07/13/2019 0325 07/14/2019 0325 07/02/2019 0409 07/30/19 0437 07/30/19 1047 07/30/2019 0315 07/21/2019 0322  WBC 8.6  --  14.1*  --   --  8.6  --  11.4*  --   NEUTROABS 6.3  --   --   --   --   --   --   --   --   HGB 10.7*   < > 9.7*   < > 10.9* 10.0* 10.5*  10.0* 10.9*  HCT 34.4*   < > 29.8*   < > 32.0* 31.4* 31.0* 32.2* 32.0*  MCV 95.6  --  94.6  --   --  92.6  --  92.0  --   PLT 215  --  160  --   --  159  --  176  --    < > = values in this interval not displayed.    INR: Recent Labs  Lab 07/27/19 0400 07/13/2019 0405 07/03/2019 0705 07/30/19 0437 07/26/2019 0315  INR 1.5* 1.4* 2.1* 3.5* 2.2*    Other results:    Imaging   Portable Chest xray  Result Date: 07/18/2019 CLINICAL DATA:  Hypoxia EXAM: PORTABLE CHEST 1 VIEW COMPARISON:  July 30, 2019. FINDINGS: Endotracheal tube tip is 3.8 cm above the carina. Nasogastric tube tip and side port are below the diaphragm. Central catheter tip is in the superior vena cava near the cavoatrial junction. There is a left ventricular assist device present. No pneumothorax. Small left pleural effusion with left and right base atelectatic change remain. No new opacity evident. Stable cardiomegaly with pulmonary vascularity normal. No adenopathy. Status post median sternotomy. IMPRESSION: Tube and catheter positions as described without pneumothorax. Stable cardiomegaly with left ventricular assist device present. Stable small left pleural effusion with bibasilar atelectasis. No new opacity evident. Electronically Signed   By: Lowella Grip III M.D.   On: 07/30/2019 07:03   Portable Chest x-ray  Result Date: 07/30/2019 CLINICAL DATA:  Hypoxia EXAM: PORTABLE CHEST 1 VIEW COMPARISON:  July 16, 2019 FINDINGS: Endotracheal tube tip is 2.0  cm above the carina. Nasogastric tube tip and side port are below the diaphragm. Central catheter tip is in the superior vena cava. No pneumothorax. There is a left ventricular assist device present. There is a small left pleural effusion with atelectatic change in the lung bases. Lungs elsewhere are clear. There is cardiomegaly with pulmonary vascularity normal. No adenopathy. Status post median sternotomy. No evident bone lesions. IMPRESSION: Tube and catheter positions as  described without pneumothorax. There is cardiomegaly, stable, with left ventricular assist device. Bibasilar atelectasis a small left pleural effusion. Lungs elsewhere clear. Electronically Signed   By: Lowella Grip III M.D.   On: 07/30/2019 09:47   DG Abd Portable 1V  Result Date: 07/22/2019 CLINICAL DATA:  Orogastric tube placement. EXAM: PORTABLE ABDOMEN - 1 VIEW COMPARISON:  07/16/2019 chest radiograph. FINDINGS: Significantly artifact degraded exam due to overlying support apparatus, including left ventricular assist device. Nasogastric tube terminates at the body of the stomach. Median sternotomy wires. Prominent gas-filled loops of small bowel in the upper abdomen may represent adynamic ileus. No gross free intraperitoneal air. IMPRESSION: Nasogastric tube terminating at the body of the stomach. Electronically Signed   By: Abigail Miyamoto M.D.   On: 07/08/2019 11:34     Medications:     Scheduled Medications: . aspirin  81 mg Per Tube Daily  . atorvastatin  80 mg Per Tube Daily  . chlorhexidine gluconate (MEDLINE KIT)  15 mL Mouth Rinse BID  . Chlorhexidine Gluconate Cloth  6 each Topical Daily  . clonazePAM  0.5 mg Per Tube BID  . digoxin  0.125 mg Per Tube Daily  . docusate  100 mg Oral BID  . feeding supplement (PROSource TF)  90 mL Per Tube TID  . folic acid  1 mg Per Tube Daily  . gabapentin  300 mg Per Tube Q8H  . mouth rinse  15 mL Mouth Rinse 10 times per day  . multivitamin with minerals  1 tablet Per Tube Daily  . pantoprazole sodium  40 mg Per Tube Daily  . polyethylene glycol  17 g Oral Daily  . sildenafil  20 mg Per Tube TID  . sodium chloride flush  10-40 mL Intracatheter Q12H  . thiamine  100 mg Per Tube Daily  . Warfarin - Pharmacist Dosing Inpatient   Does not apply q1600    Infusions: . sodium chloride    . sodium chloride 10 mL/hr at 07/19/2019 0700  . amiodarone 30 mg/hr (07/25/2019 0700)  . ceFEPime (MAXIPIME) IV Stopped (07/07/2019 4259)  . feeding  supplement (VITAL AF 1.2 CAL) 1,000 mL (07/27/2019 0521)  . fentaNYL infusion INTRAVENOUS 100 mcg/hr (07/13/2019 0700)  . lidocaine 1 mg/min (07/06/2019 0700)  . phenylephrine (NEO-SYNEPHRINE) Adult infusion Stopped (07/30/19 2100)  . vasopressin Stopped (07/05/2019 0051)    PRN Medications: Place/Maintain arterial line **AND** sodium chloride, sodium chloride, acetaminophen, fentaNYL, midazolam, midazolam, oxyCODONE, sodium chloride flush, traMADol   Assessment/Plan:    1. Refractory Ventricular Fibrillation  -multiple episodes ~10 of VF on 7/28 - multiple episodes today requiring DCCV x 3 - have d/w EP. Given recurrence will consider putting IABP and TVP in  -  Ranexa and Mexiletine currently on hold (unable to crush and administer through tube)   -  Lidocaine rebolused today x 1. Amio rebolused x 1.  - continue lidocaine gtt at 1 mg.  - increased amio gtt back to 60  - Sotalol not a good option given his history of bradycardia  - Plan to resume  Ranexa 500 mg bid + Mexiletine 200 mg bid once extubated  - Keep K > 4.0 and Mg > 2.0. - Supp K (3.5) . Mg normal at 2.0  - bedside echo 7/28 cannula ok  2. Acute Systolic Heart Failure s/p VAD - EF 79%, RV systolic function mildly reduced - HMIII LVAD Placed 6/24 - RV looks good on echo  Cannula ok  - Co-ox 62% - avoid inotropes with VF - INR 2.2 today. Coumadin dosing per pharmacy  - Volume up today. CVP 20. IV Lasix 40 mg x 1. Supp K   3. Acute hypoxic resp failure - intubated in setting of VF -  vent management per PCCM   4. CAD - S/p Acute STEMI 6/21.Emergent cath showed severe multivessel disease w/ 75% mid- distal LM, 100% prox LAD, not amendable to PCI (unable to cross w/ guide wire, probable CTO), 100% prox LCx (probable CTO) and 100% ostial RCA (probable CTO). There were mildto moderate collaterals right to left to the LAD, minimal right to right collateralsan minimalleft to left collaterals.LVEF 25% -Hs trop peaked  27,000. - No targets for CABG / PCI.  - S/P HMIII VAD - Continue ASA + statin   5. Renal mass = RCC - MRI abdomen- 1.8 cm right renal lesion, most consistent with renal cell carcinoma. No evidence of renal vein involvement or nodal metastasis. - Discussed w/ Urology and R RCC is small and will just require surveillance. Ok to proceed with VAD from their standpoint.   6. Urinary retention - Foley placed - UA + for UTI. UC pending On abx  7. UTI: + UA 7/28 w/ fever + leukocytosis. Lactic acid 2.2>>1.9 - WBC trending down, mTemp 101 - UC and BC pending  - Continue cefepime   I reviewed the LVAD parameters from today, and compared the results to the patient's prior recorded data.  No programming changes were made.  The LVAD is functioning within specified parameters.  The patient performs LVAD self-test daily.  LVAD interrogation was negative for any significant power changes, alarms or PI events/speed drops.  LVAD equipment check completed and is in good working order.  Back-up equipment present.   LVAD education done on emergency procedures and precautions and reviewed exit site care.  Length of Stay: [address] Ladoris Gene 07/08/2019, 7:56 AM  VAD Team --- VAD ISSUES ONLY--- Pager [phone] (7am - 7am)  Advanced Heart Failure Team  Pager [phone] (M-F; 7a - 4p)  Please contact [facility] for night-coverage after hours (4p -7a ) and weekends on [email or web address]  Agree with above.  Remains intubated. On amio and lido. On NE/VP. Multiple episodes of VF this am. Foley had to be flushed multiple times.   General:  Critically ill appearing. On vent HEENT: normal  + ETT Neck: supple. JVP to jaw.  Carotids 2+ bilat; no bruits. No lymphadenopathy or thryomegaly appreciated. Cor: LVAD hum.  Lungs: Clear. Abdomen: obese soft, nontender, non-distended. No hepatosplenomegaly. No bruits or masses. Good bowel sounds. Driveline site clean. Anchor in place.  Extremities: no cyanosis,  clubbing, rash. Warm 2+ edema  Neuro: sedated on vent.   Having recurrent VF. D/w EP. Have bolused lido and amio. Supp K. If continues to have VF will need IABP and TVP. Diurese. Continue to treat UTI. Hold coumadin.    CRITICAL CARE Performed by: Glori Bickers  Total critical care time: 45 minutes  Critical care time was exclusive of separately billable procedures and treating other patients.  Critical  care was necessary to treat or prevent imminent or life-threatening deterioration.  Critical care was time spent personally by me (independent of midlevel providers or residents) on the following activities: development of treatment plan with patient and/or surrogate as well as nursing, discussions with consultants, evaluation of patient's response to treatment, examination of patient, obtaining history from patient or surrogate, ordering and performing treatments and interventions, ordering and review of laboratory studies, ordering and review of radiographic studies, pulse oximetry and re-evaluation of patient's condition.   Glori Bickers, MD  9:57 AM

## 2019-07-31 NOTE — Progress Notes (Signed)
VAD Coordinator Procedure Note:   VAD Coordinator met patient in 2H01. Pt undergoing IABP placement per Dr. Haroldine Laws. VAD parameters monitored by myself and  throughout the procedure. Blood pressures were obtained with left radial A-Line/right femoral IABP.     Time: A-Line R Fem IABP Flow PI Power Speed  Pre-procedure:           10:35 97/78  4.5 2.6 3.7 5200   10:40 94/71  4.5 2.4 3.7    10:50 97/83  4.4 2.4 3.7    11:00 99/76  4.5 2.4 3.6    11:10 101/88  4.4 2.9 3.6    11:20 157/66 91/67 4.0 5.9 3.7    11:30  82/70 (89) 4.0 5.7 3.7    11:50  86/70 (91) 3.7 7.4 3.7     Patient had two VT episodes upon arrival to cath lab prior to IABP placement. Shocked x 1 both episodes at 200J with conversion to sinus rhythm. Dr. Haroldine Laws updated. VAD Coordinator accompanied and remained with patient during procedure and returned him to room as below. Pt remained sedated on Fentanyl gtt at 150 mcg/ml. He required no pressors throughout procedure.    Patient Disposition:  2H01  Zada Girt RN, VAD Coordinator 24/7 VAD Pager: [phone]

## 2019-07-31 NOTE — CV Procedure (Addendum)
    DIRECT CURRENT CARDIOVERSION  NAME:  Aaron [name]   MRN: [id number] DOB:  [birth year]   ADMIT DATE: 07/15/2019   INDICATIONS: Ventricular fibrillation    PROCEDURE:   Emergent consent. Once an appropriate time out was taken, the patient had the defibrillator pads placed in the anterior and posterior position. The patient was further sedated/ Once an appropriate level of sedation was achieved, the patient received a single biphasic, synchronized 200J shock with prompt conversion to sinus rhythm.   Glori Bickers, MD  10:00 AM

## 2019-07-31 NOTE — Progress Notes (Addendum)
1610 pt in vtach/vfib.  Mclean MD notified and gave verbal orders to shock and re-bolus amio.  Pt sedated and shocked x1. Pt converted to NSR. VAD flow 3.7-4.2 during event.  Amio bolus started. Will continue to monitor.   1812 pt in vt/vfib. Mclean paged. Shock x1 amio and lidocaine bolus. Verbal order to increase pacer rate to 80.  Pt now in NSR.

## 2019-07-31 NOTE — Progress Notes (Signed)
[facility] for Heparin Indication: LVAD>IABP  Allergies  Allergen Reactions  . Codeine Hives    Patient Measurements: Height: 5\' 9"  (175.3 cm) Weight: 91.1 kg (200 lb 13.4 oz) IBW/kg (Calculated) : 70.7 Heparin Dosing Weight: ~ 92 kg  Vital Signs: Temp: 100.8 F (38.2 C) (07/30 1200) Temp Source: Bladder (07/30 1200) BP: 114/53 (07/30 1300) Pulse Rate: 88 (07/30 1200)  Labs: Recent Labs    07/04/2019 0325 07/05/2019 0409 07/12/2019 0705 07/23/2019 0705 07/30/19 0437 07/30/19 1047 07/30/19 1235 07/20/2019 0315 07/09/2019 0315 07/22/2019 0322 07/27/2019 0322 07/25/2019 0814 07/30/2019 0935 07/19/2019 1233  HGB 9.7*   < >  --    < > 10.0*   < >  --  10.0*   < > 10.9*   < > 10.5* 11.2*  --   HCT 29.8*   < >  --    < > 31.4*   < >  --  32.2*   < > 32.0*  --  31.0* 33.0*  --   PLT 160  --   --   --  159  --   --  176  --   --   --   --   --   --   APTT  --   --  47*  --   --   --   --   --   --   --   --   --   --   --   LABPROT  --   --  22.6*   < > 33.9*  --   --  23.5*  --   --   --   --   --  21.8*  INR  --   --  2.1*   < > 3.5*  --   --  2.2*  --   --   --   --   --  2.0*  CREATININE 0.95  --   --    < > 0.95  --  1.01 1.20  --   --   --   --   --   --    < > = values in this interval not displayed.    Estimated Creatinine Clearance: 77.6 mL/min (by C-G formula based on SCr of 1.2 mg/dL).  Assessment: 54 yo male s/p LVAD HM3 placement 6/24, followed by IABP 7/8-7/13.    INR now down to 2.0 this afternoon after IABP emergently placed due to recurrent arrythmias. Discussed anticoagulation plan with Dr.Bensimhon, will go ahead and start low dose heparin without titrations this afternoon given decreasing INR trend.   Heparin 2000 units given in cath.   Goal of Therapy:  Heparin level goal <0.3 INR 2-2.5  Monitor platelets by anticoagulation protocol: Yes   Plan:  Start IV heparin this afternoon at 500 units/hr, no bolusing, no titrations at  this time Follow INR closely Check heparin level daily to ensure no accumulation  Erin Hearing PharmD., BCPS Clinical Pharmacist 07/16/2019 1:32 PM

## 2019-07-31 NOTE — Progress Notes (Addendum)
Progress Note  Patient Name: Aaron [name] Date of Encounter: 07/27/2019  [facility] Cardiologist: Dr. Marlou Porch  Subjective   Opens his eyes  Inpatient Medications    Scheduled Meds:  aspirin  81 mg Per Tube Daily   atorvastatin  80 mg Per Tube Daily   chlorhexidine gluconate (MEDLINE KIT)  15 mL Mouth Rinse BID   Chlorhexidine Gluconate Cloth  6 each Topical Daily   clonazePAM  0.5 mg Per Tube BID   digoxin  0.125 mg Per Tube Daily   docusate  100 mg Per Tube BID   feeding supplement (PROSource TF)  90 mL Per Tube TID   folic acid  1 mg Per Tube Daily   gabapentin  300 mg Per Tube Q8H   mouth rinse  15 mL Mouth Rinse 10 times per day   multivitamin  15 mL Per Tube Daily   pantoprazole sodium  40 mg Per Tube Daily   polyethylene glycol  17 g Oral Daily   potassium chloride  40 mEq Per Tube Once   sildenafil  20 mg Per Tube TID   sodium chloride flush  10-40 mL Intracatheter Q12H   thiamine  100 mg Per Tube Daily   Warfarin - Pharmacist Dosing Inpatient   Does not apply q1600   Continuous Infusions:  sodium chloride     sodium chloride 10 mL/hr at 07/30/2019 0800   amiodarone 60 mg/hr (07/30/2019 0800)   ceFEPime (MAXIPIME) IV Stopped (07/27/2019 9021)   feeding supplement (VITAL AF 1.2 CAL) Stopped (07/04/2019 1155)   fentaNYL infusion INTRAVENOUS 100 mcg/hr (07/22/2019 0800)   lidocaine 1 mg/min (07/16/2019 0700)   magnesium sulfate bolus IVPB     phenylephrine (NEO-SYNEPHRINE) Adult infusion Stopped (07/30/19 2100)   vasopressin Stopped (07/13/2019 0051)   PRN Meds: Place/Maintain arterial line **AND** sodium chloride, sodium chloride, acetaminophen, fentaNYL, midazolam, midazolam, sodium chloride flush   Vital Signs    Vitals:   07/20/2019 0645 07/17/2019 0700 07/19/2019 0724 07/13/2019 0800  BP:  (!) 87/77  97/75  Pulse: 71 69  76  Resp: '15 15  15  ' Temp: 100.2 F (37.9 C) 100.2 F (37.9 C)  (!) 100.8 F (38.2 C)  TempSrc:      SpO2: 91%  92% 90% (!) 88%  Weight:      Height:        Intake/Output Summary (Last 24 hours) at 07/05/2019 0849 Last data filed at 07/24/2019 0800 Gross per 24 hour  Intake 3298.88 ml  Output 3270 ml  Net 28.88 ml   Last 3 Weights 07/11/2019 07/30/2019 07/11/2019  Weight (lbs) 200 lb 13.4 oz 196 lb 6.9 oz 189 lb 9.5 oz  Weight (kg) 91.1 kg 89.1 kg 86 kg      Telemetry    SB 70's, long 1st degree AVblock - Personally Reviewed  ECG    No new EKGs - Personally Reviewed  Physical Exam   GEN: opens eyes s/p versed Neck: No JVD Cardiac: VAD hum.  Respiratory: CTA b/l, intubated GI: Soft, non-distended  MS: No edema; No deformity. Neuro:  unable to assess, RN reports prior to events this AM and being given versed was awake and following commaneds Psych: unable to assess   Labs    High Sensitivity Troponin:   Recent Labs  Lab 07/09/19 0740 07/09/19 0920  TROPONINIHS 3,337* 3,481*      Chemistry Recent Labs  Lab 07/27/19 0400 07/12/2019 0405 07/30/19 2080 07/30/19 1047 07/30/19 1235 07/30/19 1235 07/21/2019 0315  07/13/2019 0322 08/01/2019 0814  NA 133*   < > 127*   < > 124*   < > 127* 129* 129*  K 4.1   < > 3.1*   < > 3.8   < > 3.5 3.5 3.8  CL 101   < > 95*  --  95*  --  97*  --   --   CO2 24   < > 21*  --  19*  --  19*  --   --   GLUCOSE 99   < > 138*  --  125*  --  138*  --   --   BUN 13   < > 21*  --  24*  --  28*  --   --   CREATININE 0.97   < > 0.95  --  1.01  --  1.20  --   --   CALCIUM 8.5*   < > 8.3*  --  7.9*  --  8.0*  --   --   PROT 6.0*  --   --   --   --   --   --   --   --   ALBUMIN 2.8*  --   --   --   --   --   --   --   --   AST 67*  --   --   --   --   --   --   --   --   ALT 104*  --   --   --   --   --   --   --   --   ALKPHOS 117  --   --   --   --   --   --   --   --   BILITOT 1.0  --   --   --   --   --   --   --   --   GFRNONAA >60   < > >60  --  >60  --  >60  --   --   GFRAA >60   < > >60  --  >60  --  >60  --   --   ANIONGAP 8   < > 11  --  10  --   11  --   --    < > = values in this interval not displayed.     Hematology Recent Labs  Lab 07/19/2019 0325 07/24/2019 0409 07/30/19 0437 07/30/19 1047 07/10/2019 0315 07/30/2019 0322 07/20/2019 0814  WBC 14.1*  --  8.6  --  11.4*  --   --   RBC 3.15*  --  3.39*  --  3.50*  --   --   HGB 9.7*   < > 10.0*   < > 10.0* 10.9* 10.5*  HCT 29.8*   < > 31.4*   < > 32.2* 32.0* 31.0*  MCV 94.6  --  92.6  --  92.0  --   --   MCH 30.8  --  29.5  --  28.6  --   --   MCHC 32.6  --  31.8  --  31.1  --   --   RDW 16.9*  --  16.6*  --  16.8*  --   --   PLT 160  --  159  --  176  --   --    < > = values in this interval not displayed.  BNPNo results for input(s): BNP, PROBNP in the last 168 hours.   DDimer No results for input(s): DDIMER in the last 168 hours.   Radiology    Portable Chest xray Result Date: 07/14/2019 CLINICAL DATA:  Hypoxia EXAM: PORTABLE CHEST 1 VIEW COMPARISON:  July 30, 2019. FINDINGS: Endotracheal tube tip is 3.8 cm above the carina. Nasogastric tube tip and side port are below the diaphragm. Central catheter tip is in the superior vena cava near the cavoatrial junction. There is a left ventricular assist device present. No pneumothorax. Small left pleural effusion with left and right base atelectatic change remain. No new opacity evident. Stable cardiomegaly with pulmonary vascularity normal. No adenopathy. Status post median sternotomy. IMPRESSION: Tube and catheter positions as described without pneumothorax. Stable cardiomegaly with left ventricular assist device present. Stable small left pleural effusion with bibasilar atelectasis. No new opacity evident. Electronically Signed   By: Lowella Grip III AaronD.   On: 07/04/2019 07:03     Cardiac Studies    07/09/2019: TTE IMPRESSIONS  1. Left ventricular ejection fraction, by estimation, is <20%. The left  ventricle has severely decreased function. The left ventricle demonstrates  global hypokinesis. The left ventricular  internal cavity size was mildly  dilated. There is mild left  ventricular hypertrophy.  2. Right ventricular systolic function is mildly reduced. The right  ventricular size is normal.  3. The mitral valve is normal in structure. Trivial mitral valve  regurgitation. No evidence of mitral stenosis.  4. The aortic valve is poorly visualized but does not appear to open.  5. Ramp echo done with speed increased from 5000 rpm to 5100 rpm.   06/17/2019: R/LHC  Mid LM lesion is 75% stenosed.  Ost LAD lesion is 100% stenosed.  Ost Cx to Prox Cx lesion is 100% stenosed.  Ost RCA lesion is 100% stenosed.  Hemodynamic findings consistent with severe pulmonary hypertension.  Intra-aortic balloon pump was placed to help with hemodynamic stability  Intervention was deferred unable to cross with a wire ostial LAD  Intervention was otherwise deferred in favor of bypass surgery  Conclusion Evidence of moderate to severe pulmonary hypertension Mean wedge of 43 mean PA of 53 Severe dilated cardiomyopathy probably ischemic ejection fraction less than 25% Moderate calcification of the proximal coronary arteries Multiple CTO's ostial LAD proximal circumflex ostial RCA Mild to moderate collaterals right to left to the LAD Minimal right to right collaterals Minimal left to left collaterals Coronaries 75% mid to distal left main 100% ostial LAD probable CTO 100% proximal circumflex probable CTO 100% ostial RCA probable CTO Collaterals as described Intervention was deferred after unable to cross with the wire for what was thought to be suspected CTO's of the ostial LAD Intra-aortic balloon pump was placed right femoral artery to help with hemodynamic stability 7 [place] Swan was placed on left and the PA position to help with hemodynamic monitoring Patient was placed on IV heparin for anticoagulation Arrangements were made for transfer to [facility] for possible ICU CCU care and  possible evaluation for coronary bypass surgery Dr. Nechama Guard accepted the patient in transfer to CCU:   Patient Profile     54 y.o. male  w/PMHx of CAD (prior CABG), HTN, ICM, renal mass c/w RCC.  presented 06/18/19 with acute anterolateral STEMI. He underwent emergent cath and was found to have multivessel CAD. PCI of the LAD was unsuccessful (there were no targets for CABG). He required IABP and subsequent VAD for cardiogenic shock. - HM3  placement 6/24  - Had 2 episodes of VF requiring emergent DC-CV on 07/02/19 - Recurrent VF on 7/2 and broke spontaneously.  - 7/3 Recurrent VF at 2a.->emergent DC-CV.  - 07/07/2019 VT/Torsades -> repeat DC_CV - 07/08/2019 VF/Torsades x2-->DC-CV x2.  - 07/09/19 developed sepsis/PNA -> intubated - 07/09/19 Multiple episodes of VT/VF -> IABP placed. AV pacing via EPW He did eventually improve, transitioned to PO AAD with amiodarone aand mexiletine. IABP was removed, HR improved and EPW removed, ultimately mexiletine was stopped, amio continued and able to be discharged to [facility] 07/21/2019 Notes report not a transplant candidate given smoker.   Developed VF in [place] and readmitted 08/01/2019 > emergent defibrillation started on amio gtt and mexiletine Overnight developed refractory VF requiring numerous shocks > amio gtt, lido gtt, intubated on pressors  Assessment & Plan    1. Refractory VF     More VF this AM, has been defibrillated x3  amio gtt re bolused > 60 Lido gtt rebolused and remains at @ 1 Off pressors  Has been febrile the last 24hours T max 101.3 yesterday afternoon, this Am 100.8 Abnormal UA, culture so far neg, on abx   Unable to give Ranexa through tube and d/c for now, off mexiletine as well with lidocaine gtt  The patient was seen this AM by Dr. Curt [name] re discussed case with him given more VT Has baseline bradycardia 40's with long 1st degree, sotalol likely Aaron [name] worsen his bradycardia Not certain temp pacing is a good strategy, ? Aaron [name]  he become dependent with his baseline conduction disease, not currently a permanent device candidate.  This AM though HR 70's currently   For questions or updates, please contact [place] Please consult [email or web address] for contact info under        Signed, Aaron [place], PA-C  07/27/2019, 8:49 AM    I have seen and examined this patient with Aaron [name].  Agree with above, note added to reflect my findings.  On exam, RRR, LVAD hum.  Patient has had unfortunately more episodes of ventricular fibrillation.  He is now status post temporary pacing wire and balloon pump.  He is currently on IV amiodarone and IV lidocaine.  At this point, further options for therapy for his ventricular fibrillation are somewhat limited.  We Aaron [name] continue with IV amiodarone and lidocaine.  If he does get extubated, mexiletine and Ranexa would be the next options of medications to add to be able to stop the lidocaine.  I am concerned that this is potentially ischemia driven, but with his nonrevascularizable coronary disease, he Aaron [name] likely to continue to have issues.  Aaron [name] M. Bentley Fissel MD 07/22/2019 2:18 PM

## 2019-07-31 NOTE — Plan of Care (Signed)

## 2019-07-31 NOTE — Procedures (Signed)
Electrical Cardioversion Procedure Note LEELAN RAJEWSKI [id number] [birth year]  Procedure: Electrical Cardioversion Indications:  Ventricular fibrillation  Procedure Details Consent: Unable to obtain consent because of altered level of consciousness. Time Out: Verified patient identification, verified procedure, site/side was marked, verified correct patient position, special equipment/implants available, medications/allergies/relevent history reviewed, required imaging and test results available.  Performed  Patient placed on cardiac monitor, pulse oximetry, supplemental oxygen as necessary.  Sedation given: Versed/Fentanyl running Pacer pads placed anterior and posterior chest.  Cardioverted 1 time(s).  Cardioverted at [place].  Evaluation Findings: Post procedure EKG shows: NSR Complications: None Patient did tolerate procedure well.  Amiodarone increased to 60 mg/hr.    Loralie Champagne 07/06/2019, 7:50 AM

## 2019-07-31 NOTE — Progress Notes (Signed)
[place] for warfarin Indication: LVAD  Allergies  Allergen Reactions  . Codeine Hives    Patient Measurements: Height: 5\' 9"  (175.3 cm) Weight: 91.1 kg (200 lb 13.4 oz) IBW/kg (Calculated) : 70.7 Heparin Dosing Weight: ~ 92 kg  Vital Signs: Temp: 100.8 F (38.2 C) (07/30 0800) Temp Source: Bladder (07/30 0400) BP: 97/75 (07/30 0800) Pulse Rate: 76 (07/30 0800)  Labs: Recent Labs    07/27/2019 0325 07/28/2019 0409 07/27/2019 0705 07/30/19 0437 07/30/19 1047 07/30/19 1235 07/06/2019 0315 07/19/2019 0315 07/20/2019 0322 07/06/2019 0814  HGB 9.7*   < >  --  10.0*   < >  --  10.0*   < > 10.9* 10.5*  HCT 29.8*   < >  --  31.4*   < >  --  32.2*  --  32.0* 31.0*  PLT 160  --   --  159  --   --  176  --   --   --   APTT  --   --  47*  --   --   --   --   --   --   --   LABPROT  --   --  22.6* 33.9*  --   --  23.5*  --   --   --   INR  --   --  2.1* 3.5*  --   --  2.2*  --   --   --   CREATININE 0.95   < >  --  0.95  --  1.01 1.20  --   --   --    < > = values in this interval not displayed.    Estimated Creatinine Clearance: 77.6 mL/min (by C-G formula based on SCr of 1.2 mg/dL).  Assessment: 54 yo male s/p LVAD HM3 placement 6/24, followed by IABP 7/8-7/13.    INR now down to 2.2 this morning. Discussed plan with HF team, will not initiate warfarin at this time, originally not planning heparin today but now planning IABP so may need IV heparin sooner.   Goal of Therapy:  INR 2-2.5  Monitor platelets by anticoagulation protocol: Yes   Plan:  Will d/c warfarin consult Transition to heparin when ok with HF team  Erin Hearing PharmD., BCPS Clinical Pharmacist 07/26/2019 8:25 AM

## 2019-07-31 NOTE — Significant Event (Signed)
Significant Event Note:  0531 - pt went into polymorphic VT, pt awake & expressing chest discomfort, art-line MAP 81 0532 - VF, 184mcg fentanyl given 0536 - 2mg  versed given 0539 - pt now sedated, defib x1 (120J), back in SR 70s  VAD coordinator Judson Roch updated about this event. She said she'd text Dr. Haroldine Laws about it.  Mag 2.0 K 3.5 (received 37mEq KCL via OGT at 0353) Lido level pending  Pt on Amio@30mg , Lido@1mg /min, and Fentanyl@50mcg /hr. Neo and Vaso have been off since last night.  Will continue to monitor closely.  Henreitta Leber, RN 07/30/2019

## 2019-07-31 NOTE — Progress Notes (Signed)
Vifb noted on bedside EKG.  No flow drops in LVAD or change in IABP.  [place] notified.  Ok to [employer].  Defib x 1 with return to pmr.

## 2019-07-31 NOTE — Progress Notes (Signed)
Patient was transported to the [place] lab then back to 2H01 without any complications.

## 2019-07-31 NOTE — Progress Notes (Signed)
LVAD Coordinator Rounding Note:  HM III LVAD implanted on 06/25/19 by Dr. Orvan Seen under Destination Therapy criteria due to recent smoking history.  Pt's transferred to CIR on 07/24/19. Had refractory VF 07/16/2019 and transferred back to [place].  Pt remains intubated, sedated. Has had multiple shocks this am for recurrent Vfib. Nurse at bedside. Pt back in Vfib; Dr. Haroldine Laws updated, to room and shocked x 1 back into SR. Dr. Haroldine Laws plans on taking patient to Cath Lab for IABP insertion. He asked that I update patients' sister.  VAD Coordinator called [name] and updated her about above and plan for Dr. Haroldine Laws to take him to Cath Lab for IABP placement. Sister verbalized understanding and agreement to same.   O2 sat 88 - 90% on FIO2 60% and 5 peep. FIO2 increased to 100%.  ABG obtained and reviewed by CCM MD; peep increased to 10 cm.  O2 sats improved to 94 - 95% and remained at this level during cath lab procedure.   Ex-wife at bedside; updated about events this am and plan for IABP placement per Dr. Haroldine Laws.  While getting patient ready to roll to cath lab, recurrent Vfib. Dr. Haroldine Laws notified. Shocked patient 200J with return to sinus rhythm. Pt transported to cath lab urgently. Upon arrival, pt had two episodes of VTach which both converted to sinus rhythm with 200J shocks.   Vital signs: Temp: 100.8 HR: 75 Art. Line:  96/87 (92) Doppler:  Not done O2 Sat: 88 - 90% on 60% FiO2 5 CPAP Wt: 189.6>196.4>200.8 lbs  LVAD parameters: Speed:  5200 Flow: 4.4 Power:  3.7w PI: 2.9 Hct: 32  Alarms: none Events: > 120 PI events today   Fixed speed: 5200 Low speed limit: 4900   Drive Line:  CDI. [place] secure.  Every other day dressing changes per bedside nurse. Next dressing change due: 08/01/19.   Labs:  LDH trend: 326>250>256  INR trend: 2.1>3.5>2.2  Anticoagulation Plan: -INR Goal:  2.0 - 2.5 -ASA Dose: 81 mg - will keep going for CAD for 3 mths stop date 09/24/25  Device:  N/A  Drips: - Amiodarone 30 mg/hr - Vasopressin 0.03 units/min - Fentanyl 100 mcg/ml - Lidocaine 1 mg/min   Infection: - 07/04/2019 - BCs>>pending - 07/08/2019 - urine culture>>negative - 07/30/19 - respiratory culture>>pending  Arrythmias:  - 07/24/2019 Vfib while in CIR; emergently transferred back to 2H>> DCCV back to NSR; Amio gtt - 07/23/2019 recurrent VF>>increased amio gtt; Lido bolus/gtt; DC-CV 200J to NSR - 07/06/2019 recurrent VF>>8 separate DC-CV throughout night; required pressors for BP support and re-intubation - 07/30/2019 recurrent VF>>7 separate DC-CV throughout am; IABP and pacer urgently placed in Cath Lab   Respiratory:  - intubated 07/02/2019    Plan/Recommendations:  1. Call VAD pager if any VAD equipment or drive line issues. 2. Every other day dressing changes per bedside nurse  Zada Girt RN [place] Coordinator  Office: [phone]  24/7 Pager: [phone]

## 2019-07-31 NOTE — Progress Notes (Signed)
NAME:  Aaron [name], MRN:  [id number], DOB:  10-May-1965, LOS: 3 ADMISSION DATE:  07/02/2019, CONSULTATION DATE:  07/30/19 REFERRING MD:  Heart Failure, CHIEF COMPLAINT:  Respiratory failure -- VF, LVAD   Brief History   54 yo M s/p LVAD who is back in CVICU after development of VF requiring emergent DCCV 7/27. Several recurrences 7/28. Remains intubated 7/29 and PCCM consulted for vent management   History of present illness   54 yo PMH acute systolic heart failure s/p LVAD (06/25/19), recurrent VT/VF/Torsades, multivessel CAD, R renal mass c/f RCC, suspected OSA, who presents back to ICU after prolonged hospital course after which he was transferred to [facility] 7/23. At CIR, pt developed dyspnea, hypoxia following PT 7/27. ECG obtained and reveals VF rate 210 bpm. Transferred to CVICU 7/27 for emergent DCCV. Intubated by anesthesia. DCCV performed numerous times 7/27, 7/28. Remains intubated 7/29.  PCCM consulted 7/29 for vent management    Past Medical History  Acute systolic heart failure s/p LVAD CAD Recurrent VF, VT, torsades  Renal mass Debility Sleep apnea   Significant Hospital Events   6/16 admitted with acute anterolateral STEMI, cardiogenic shock requiring IABP 6/25 LVAD placement 7/3, 7/6, 7/7: recurrent VT/VT/Torsades requiring multiple emergent DCCV  7/8 PNA, respiratory failure, required intubation 7/13 IABP removed, extubated 7/23 discharred to CIR 7/27 recurrent VF, admit to CVICU for DCCV. Intubated 7/28 recurrent VF, DCCV multiple times 7/29 PCCM consult for vent  7/30: vt overnight defib x2  Consults:  EP PCCM  Procedures:  7/27 ETT> 7/28 Art line>  Significant Diagnostic Tests:    Micro Data:  7/28 MRSA> neg 7/28 BCx>>ngtd 7/28 UCx>> ngtd 7/29 tracheal aspirate >> gnr  Antimicrobials:  7/27 vanc>7/29 7/27 cefepime>   Interim history/subjective:  7/30: polymorphic VT and vfib this am. HF and VAD team aware. Cont on antiarrythmic infusions. defib  x2 this am.  7/29:Remains intubated, sedated on prop gtt  On lido, amio, vaso, neo gtt   Objective   Blood pressure (!) 87/77, pulse 69, temperature 100.2 F (37.9 C), resp. rate 15, height 5\' 9"  (1.753 m), weight 91.1 kg, SpO2 90 %. CVP:  [12 mmHg-20 mmHg] 16 mmHg  Vent Mode: PRVC FiO2 (%):  [40 %-60 %] 60 % Set Rate:  [18 bmp] 18 bmp Vt Set:  [560 mL] 560 mL PEEP:  [5 cmH20] 5 cmH20 Pressure Support:  [5 cmH20] 5 cmH20 Plateau Pressure:  [18 cmH20-22 cmH20] 18 cmH20   Intake/Output Summary (Last 24 hours) at 08/01/2019 0733 Last data filed at 07/20/2019 0700 Gross per 24 hour  Intake 3270.9 ml  Output 3020 ml  Net 250.9 ml   Filed Weights   07/27/2019 2030 07/30/19 0500 07/24/2019 0500  Weight: 86 kg 89.1 kg 91.1 kg    Examination: General: Critically ill appearing, middle aged M, sedated but arousable, on pressors, with LVAD, NAD HENT: NCAT, pink mm, trachea midline, ETT secure OGt secure  Lungs: CTA anteriorly. Symmetrical chest expansion, mechanically ventilated breaths  Cardiovascular: Bradycardic rate. LVAD hum audible. Cap refill < 3 seconds  Abdomen: Soft round distended but non tender + bowel sounds  Extremities: BUE mild dependent edema. Symmetrical bulk and tone without obvious joint deformity. No cyanosis or clubbing  Neuro: arousable and following commands GU: Foley  Skin: c/d/w without rash   Resolved Hospital Problem list     Assessment & Plan:   Acute respiratory insufficiency requiring intubation in setting of recurrent VF requiring repeated DCCV -intubated 7/27 P -VAP bundle -cont  fentanyl gtt -will d/w primary re: sedation and sbt in light of morning events. (update: primary recommends waiting until pt has been without cardiac events/arrhythmias for at least 24-48 hours prior to extubation)  -increased at some point to 100% fio2 but peep still at 5. abg this am (pt has arterial line so even if pulse ox is not pleth good then please pull arterial gas to  verify) was pao2 of 140's on 60% so will decrease back to that and recheck gas for continued wean.   Recurrent VFib requiring repeated DCCV -initial admission, numerous episopdes of recurrent VT/VF/Torsades requiring DVVc.  -Recurrence 7/27 prompting transfer from CIR to CVICU. Several episodes requiring DCCV 7/27, 7/28 -recurrence 7/30 DCCVx1 P -EP is following -on lido, amio gtts -Enteral dig -continue to optimize electrolytes   Shock Acute systolic heart failure s/p LVAD placement (06/25/19) P -per advanced heart failure  -on vaso, neo  Sepsis due to UTI -Tmax 101.3 new mild leukocytosis 14.1->11 -BCx UCx sent, started on vanc/ cefepime -trach cx gnr P -MRSA neg, dc vanc -trach with gnr -follow up cx data -continue cefepime, de-escalate vs augment based on cx data  Hypokalemia -Low-normal Magnesium  Hyponatremia-129, improving  P -active replacement ongoing  -trend BMP, mag, phos, iCal  -goal K >4 and Mag >2 especially in light of recent arrhythmias  Coagulopathy, on warfarin -dosing per pharmacy   Best practice:  Diet: EN per RDN  Pain/Anxiety/Delirium protocol (if indicated): c fent gtt + PRN BZD  VAP protocol (if indicated): yes DVT prophylaxis: warfarin per pharmacy  GI prophylaxis: protonix  Glucose control: monitor  Mobility: BR - -will need PT/OT, likely CIR again when critical illness has improved  Code Status: Full  Family Communication: Per primary Disposition: ICU   Labs   CBC: Recent Labs  Lab 07/27/19 0400 07/27/19 0400 07/26/2019 0325 07/27/2019 0325 07/13/2019 0409 07/30/19 0437 07/30/19 1047 07/17/2019 0315 07/26/2019 0322  WBC 8.6  --  14.1*  --   --  8.6  --  11.4*  --   NEUTROABS 6.3  --   --   --   --   --   --   --   --   HGB 10.7*   < > 9.7*   < > 10.9* 10.0* 10.5* 10.0* 10.9*  HCT 34.4*   < > 29.8*   < > 32.0* 31.4* 31.0* 32.2* 32.0*  MCV 95.6  --  94.6  --   --  92.6  --  92.0  --   PLT 215  --  160  --   --  159  --  176  --    <  > = values in this interval not displayed.    Basic Metabolic Panel: Recent Labs  Lab 07/27/19 0400 07/10/2019 0405 07/27/2019 2222 07/05/2019 2222 07/22/2019 0325 07/07/2019 0409 07/29/19 1742 07/30/19 0437 07/30/19 1047 07/30/19 1235 07/30/19 1730 07/27/2019 0315 07/18/2019 0322  NA 133*   < > 129*   < > 125*   < >  --  127* 128* 124*  --  127* 129*  K 4.1   < > 4.6   < > 4.7   < >  --  3.1* 4.0 3.8  --  3.5 3.5  CL 101   < > 96*  --  95*  --   --  95*  --  95*  --  97*  --   CO2 24   < > 22  --  18*  --   --  21*  --  19*  --  19*  --   GLUCOSE 99   < > 180*  --  282*  --   --  138*  --  125*  --  138*  --   BUN 13   < > 13  --  13  --   --  21*  --  24*  --  28*  --   CREATININE 0.97   < > 1.04  --  0.95  --   --  0.95  --  1.01  --  1.20  --   CALCIUM 8.5*   < > 8.6*  --  7.4*  --   --  8.3*  --  7.9*  --  8.0*  --   MG 2.1  --  1.8  --  2.4  --   --  1.9  --   --   --  2.0  --   PHOS  --   --   --   --   --   --  4.5 3.6  --   --  3.4 3.7  --    < > = values in this interval not displayed.   GFR: Estimated Creatinine Clearance: 77.6 mL/min (by C-G formula based on SCr of 1.2 mg/dL). Recent Labs  Lab 07/27/19 0400 07/12/2019 0325 07/06/2019 0329 07/05/2019 0705 07/30/19 0437 07/30/19 1200 07/12/2019 0315  WBC 8.6 14.1*  --   --  8.6  --  11.4*  LATICACIDVEN  --   --  1.5 2.2*  --  1.9  --     Liver Function Tests: Recent Labs  Lab 07/27/19 0400  AST 67*  ALT 104*  ALKPHOS 117  BILITOT 1.0  PROT 6.0*  ALBUMIN 2.8*   No results for input(s): LIPASE, AMYLASE in the last 168 hours. No results for input(s): AMMONIA in the last 168 hours.  ABG    Component Value Date/Time   PHART 7.416 07/09/2019 0322   PCO2ART 34.9 07/13/2019 0322   PO2ART 74 (L) 07/25/2019 0322   HCO3 22.4 07/20/2019 0322   TCO2 23 07/04/2019 0322   ACIDBASEDEF 2.0 07/07/2019 0322   O2SAT 62.1 07/11/2019 0325     Coagulation Profile: Recent Labs  Lab 07/27/19 0400 07/13/2019 0405 07/23/2019 0705  07/30/19 0437 07/13/2019 0315  INR 1.5* 1.4* 2.1* 3.5* 2.2*    Cardiac Enzymes: No results for input(s): CKTOTAL, CKMB, CKMBINDEX, TROPONINI in the last 168 hours.  HbA1C: Hgb A1c MFr Bld  Date/Time Value Ref Range Status  06/22/2019 11:59 AM 5.4 4.8 - 5.6 % Final    Comment:    (NOTE)         Prediabetes: 5.7 - 6.4         Diabetes: >6.4         Glycemic control for adults with diabetes: <7.0     CBG: Recent Labs  Lab 07/30/19 1103 07/30/19 1507 07/30/19 1947 07/30/2019 0021 07/13/2019 0329  GLUCAP 134* 115* 114* 126* 129*      Critical care time: The patient is critically ill with multiple organ systems failure and requires high complexity decision making for assessment and support, frequent evaluation and titration of therapies, application of advanced monitoring technologies and extensive interpretation of multiple databases.  Critical care time 35 mins. This represents my time independent of the NPs time taking care of the pt. This is excluding procedures.    [facility] 07/22/2019, 7:34 AM

## 2019-07-31 NOTE — Progress Notes (Signed)
°  Had recurrent VF this am. (5th episode).   Amio and lido bolused with no effect.   DC-CV with 200 J.   Will take to cath lab for IABP and TVP (due to bradycardia).   Additional CCT 35 mins.   Glori Bickers, MD  9:59 AM

## 2019-07-31 NOTE — Progress Notes (Addendum)
During shift report pt went into Vtach/vfib.  VAD coordinator and HF team notified.  [facility]clean and Simmons at bedside. Pt shocked x1.  Pt now in NSR.  Will continue to monitor.   0813 pt back in VT/fib shock x1. Pt converted to NSR HR 77.

## 2019-07-31 NOTE — Progress Notes (Signed)
IABP alarmed gas loss in circuit.  [place] paged and cath lab team at bedside. Lines examined and no blood observed in helium line to indicate balloon rupture.  Pump restarted by cath lab and helium tank changed.  IABP now running effectively.  Aundra Dubin MD made aware.  Will continue to monitor.

## 2019-07-31 NOTE — Progress Notes (Signed)
Pt's sister, [name], called for updates. Told her that since she was last updated this afternoon he had to be shocked twice for VF rhythm. She wanted to know that we were giving him medicine to ensure his comfort, particularly when getting shocked. I assured her that we were doing so.  Explained that he is on continuous pain medicine to keep him comfortable so he can rest well tonight.   She is grateful for our team's care and competence.

## 2019-07-31 NOTE — Plan of Care (Signed)

## 2019-07-31 NOTE — Progress Notes (Signed)
Changed vent to 60% and 5 based on abg done this morning on 60%.   Recheck abg 11am via a line

## 2019-08-01 ENCOUNTER — Inpatient Hospital Stay (HOSPITAL_COMMUNITY): Payer: BC Managed Care – PPO

## 2019-08-01 DIAGNOSIS — J9600 Acute respiratory failure, unspecified whether with hypoxia or hypercapnia: Secondary | ICD-10-CM

## 2019-08-01 LAB — CBC
HCT: 30.5 % — ABNORMAL LOW (ref 39.0–52.0)
Hemoglobin: 9.4 g/dL — ABNORMAL LOW (ref 13.0–17.0)
MCH: 28.8 pg (ref 26.0–34.0)
MCHC: 30.8 g/dL (ref 30.0–36.0)
MCV: 93.6 fL (ref 80.0–100.0)
Platelets: 153 10*3/uL (ref 150–400)
RBC: 3.26 MIL/uL — ABNORMAL LOW (ref 4.22–5.81)
RDW: 16.8 % — ABNORMAL HIGH (ref 11.5–15.5)
WBC: 10.8 10*3/uL — ABNORMAL HIGH (ref 4.0–10.5)
nRBC: 0 % (ref 0.0–0.2)

## 2019-08-01 LAB — COMPREHENSIVE METABOLIC PANEL
ALT: 71 U/L — ABNORMAL HIGH (ref 0–44)
AST: 60 U/L — ABNORMAL HIGH (ref 15–41)
Albumin: 2.2 g/dL — ABNORMAL LOW (ref 3.5–5.0)
Alkaline Phosphatase: 157 U/L — ABNORMAL HIGH (ref 38–126)
Anion gap: 9 (ref 5–15)
BUN: 26 mg/dL — ABNORMAL HIGH (ref 6–20)
CO2: 19 mmol/L — ABNORMAL LOW (ref 22–32)
Calcium: 8.1 mg/dL — ABNORMAL LOW (ref 8.9–10.3)
Chloride: 100 mmol/L (ref 98–111)
Creatinine, Ser: 0.93 mg/dL (ref 0.61–1.24)
GFR calc Af Amer: >60 mL/min (ref >60)
GFR calc non Af Amer: >60 mL/min (ref >60)
Glucose, Bld: 131 mg/dL — ABNORMAL HIGH (ref 70–99)
Potassium: 4.2 mmol/L (ref 3.5–5.1)
Sodium: 128 mmol/L — ABNORMAL LOW (ref 135–145)
Total Bilirubin: 1 mg/dL (ref 0.3–1.2)
Total Protein: 6 g/dL — ABNORMAL LOW (ref 6.5–8.1)

## 2019-08-01 LAB — GLUCOSE, CAPILLARY
Glucose-Capillary: 126 mg/dL — ABNORMAL HIGH (ref 70–99)
Glucose-Capillary: 133 mg/dL — ABNORMAL HIGH (ref 70–99)
Glucose-Capillary: 134 mg/dL — ABNORMAL HIGH (ref 70–99)
Glucose-Capillary: 139 mg/dL — ABNORMAL HIGH (ref 70–99)
Glucose-Capillary: 145 mg/dL — ABNORMAL HIGH (ref 70–99)
Glucose-Capillary: 147 mg/dL — ABNORMAL HIGH (ref 70–99)

## 2019-08-01 LAB — COOXEMETRY PANEL
Carboxyhemoglobin: 1 % (ref 0.5–1.5)
Methemoglobin: 0.9 % (ref 0.0–1.5)
O2 Saturation: 58.3 %
Total hemoglobin: 10.1 g/dL — ABNORMAL LOW (ref 12.0–16.0)

## 2019-08-01 LAB — CULTURE, RESPIRATORY W GRAM STAIN

## 2019-08-01 LAB — POCT I-STAT 7, (LYTES, BLD GAS, ICA,H+H)
Acid-base deficit: 4 mmol/L — ABNORMAL HIGH (ref 0.0–2.0)
Bicarbonate: 21.4 mmol/L (ref 20.0–28.0)
Calcium, Ion: 1.18 mmol/L (ref 1.15–1.40)
HCT: 29 % — ABNORMAL LOW (ref 39.0–52.0)
Hemoglobin: 9.9 g/dL — ABNORMAL LOW (ref 13.0–17.0)
O2 Saturation: 95 %
Patient temperature: 37.5
Potassium: 4.3 mmol/L (ref 3.5–5.1)
Sodium: 130 mmol/L — ABNORMAL LOW (ref 135–145)
TCO2: 22 mmol/L (ref 22–32)
pCO2 arterial: 38.1 mmHg (ref 32.0–48.0)
pH, Arterial: 7.359 (ref 7.350–7.450)
pO2, Arterial: 82 mmHg — ABNORMAL LOW (ref 83.0–108.0)

## 2019-08-01 LAB — BASIC METABOLIC PANEL
Anion gap: 9 (ref 5–15)
BUN: 32 mg/dL — ABNORMAL HIGH (ref 6–20)
CO2: 20 mmol/L — ABNORMAL LOW (ref 22–32)
Calcium: 7.9 mg/dL — ABNORMAL LOW (ref 8.9–10.3)
Chloride: 100 mmol/L (ref 98–111)
Creatinine, Ser: 1.05 mg/dL (ref 0.61–1.24)
GFR calc Af Amer: >60 mL/min (ref >60)
GFR calc non Af Amer: >60 mL/min (ref >60)
Glucose, Bld: 134 mg/dL — ABNORMAL HIGH (ref 70–99)
Potassium: 4.6 mmol/L (ref 3.5–5.1)
Sodium: 129 mmol/L — ABNORMAL LOW (ref 135–145)

## 2019-08-01 LAB — PROTIME-INR
INR: 1.5 — ABNORMAL HIGH (ref 0.8–1.2)
Prothrombin Time: 17.6 seconds — ABNORMAL HIGH (ref 11.4–15.2)

## 2019-08-01 LAB — LIDOCAINE LEVEL: Lidocaine Lvl: 3.3 ug/mL (ref 1.5–5.0)

## 2019-08-01 LAB — DIGOXIN LEVEL: Digoxin Level: 0.4 ng/mL — ABNORMAL LOW (ref 0.8–2.0)

## 2019-08-01 LAB — LACTATE DEHYDROGENASE: LDH: 358 U/L — ABNORMAL HIGH (ref 98–192)

## 2019-08-01 LAB — MAGNESIUM: Magnesium: 2.5 mg/dL — ABNORMAL HIGH (ref 1.7–2.4)

## 2019-08-01 LAB — HEPARIN LEVEL (UNFRACTIONATED): Heparin Unfractionated: <0.1 IU/mL — ABNORMAL LOW (ref 0.30–0.70)

## 2019-08-01 MED ORDER — MIDAZOLAM 50MG/50ML (1MG/ML) PREMIX INFUSION
0.0000 mg/h | INTRAVENOUS | Status: DC
  Administered 2019-08-01: 2 mg/h via INTRAVENOUS
  Administered 2019-08-02: 3 mg/h via INTRAVENOUS
  Administered 2019-08-02 – 2019-08-05 (×3): 2 mg/h via INTRAVENOUS
  Filled 2019-08-01 (×5): qty 50

## 2019-08-01 MED ORDER — FUROSEMIDE 10 MG/ML IJ SOLN: 80.0000 mg | Freq: Two times a day (BID) | INTRAMUSCULAR | Status: DC

## 2019-08-01 MED ORDER — MIDAZOLAM BOLUS VIA INFUSION
1.0000 mg | INTRAVENOUS | Status: DC | PRN
  Administered 2019-08-02 – 2019-08-05 (×5): 2 mg via INTRAVENOUS
  Filled 2019-08-01: qty 2

## 2019-08-01 MED ORDER — POTASSIUM CHLORIDE 10 MEQ/50ML IV SOLN
10.0000 meq | INTRAVENOUS | Status: AC
  Administered 2019-08-01 (×4): 10 meq via INTRAVENOUS
  Filled 2019-08-01 (×4): qty 50

## 2019-08-01 MED ORDER — FUROSEMIDE 10 MG/ML IJ SOLN
80.0000 mg | Freq: Two times a day (BID) | INTRAMUSCULAR | Status: AC
  Administered 2019-08-01 (×2): 80 mg via INTRAVENOUS
  Filled 2019-08-01 (×2): qty 8

## 2019-08-01 MED ORDER — POTASSIUM CHLORIDE 10 MEQ/50ML IV SOLN: 10.0000 meq | INTRAVENOUS | Status: DC

## 2019-08-01 MED ORDER — AMIODARONE IV BOLUS ONLY 150 MG/100ML
150.0000 mg | Freq: Once | INTRAVENOUS | Status: AC
  Administered 2019-08-01: 150 mg via INTRAVENOUS

## 2019-08-01 NOTE — Progress Notes (Signed)
Progress Note  Patient Name: Aaron [name] Date of Encounter: 08/01/2019  Primary Cardiologist: No primary care provider on file.   Subjective   Remains critically ill on IV versed drip/pressors.   Inpatient Medications    Scheduled Meds: . aspirin  81 mg Per Tube Daily  . atorvastatin  80 mg Per Tube Daily  . chlorhexidine gluconate (MEDLINE KIT)  15 mL Mouth Rinse BID  . Chlorhexidine Gluconate Cloth  6 each Topical Daily  . clonazePAM  0.5 mg Per Tube BID  . digoxin  0.125 mg Per Tube Daily  . docusate  100 mg Per Tube BID  . feeding supplement (PROSource TF)  90 mL Per Tube TID  . folic acid  1 mg Per Tube Daily  . free water  20 mL Per Tube Q4H  . furosemide  80 mg Intravenous BID  . gabapentin  300 mg Per Tube Q8H  . mouth rinse  15 mL Mouth Rinse 10 times per day  . multivitamin  15 mL Per Tube Daily  . pantoprazole sodium  40 mg Per Tube Daily  . polyethylene glycol  17 g Oral Daily  . sildenafil  20 mg Per Tube TID  . sodium chloride flush  10-40 mL Intracatheter Q12H  . thiamine  100 mg Per Tube Daily   Continuous Infusions: . sodium chloride    . sodium chloride Stopped (08/01/19 0653)  . sodium chloride 20 mL/hr at 08/01/19 0700  . amiodarone 60 mg/hr (08/01/19 0700)  . ceFEPime (MAXIPIME) IV Stopped (08/01/19 0612)  . feeding supplement (VITAL AF 1.2 CAL) 1,000 mL (08/01/19 0330)  . fentaNYL infusion INTRAVENOUS 200 mcg/hr (08/01/19 0835)  . heparin 500 Units/hr (08/01/19 0700)  . lidocaine 1 mg/min (08/01/19 0700)  . midazolam 2 mg/hr (08/01/19 0828)  . phenylephrine (NEO-SYNEPHRINE) Adult infusion Stopped (07/30/19 2100)  . potassium chloride 10 mEq (08/01/19 0834)  . vasopressin Stopped (07/24/2019 0051)   PRN Meds: Place/Maintain arterial line **AND** sodium chloride, sodium chloride, acetaminophen, fentaNYL, midazolam, sodium chloride flush   Vital Signs    Vitals:   08/01/19 0700 08/01/19 0715 08/01/19 0730 08/01/19 0734  BP:      Pulse:  80 79 79 89  Resp: '13 17 18 17  ' Temp: 99.7 F (37.6 C) 99.7 F (37.6 C) 99.5 F (37.5 C) 99.5 F (37.5 C)  TempSrc:      SpO2: 97% 99% 97% 98%  Weight: (!) 94.8 kg     Height: '5\' 9"'  (1.753 m)       Intake/Output Summary (Last 24 hours) at 08/01/2019 0837 Last data filed at 08/01/2019 0700 Gross per 24 hour  Intake 3486.49 ml  Output 1655 ml  Net 1831.49 ml   Filed Weights   07/30/19 0500 07/27/2019 0500 08/01/19 0700  Weight: 89.1 kg 91.1 kg (!) 94.8 kg    Telemetry    Ventricular fib/NSR/ventricular pacing - Personally Reviewed  ECG    none - Personally Reviewed  Physical Exam   GEN: critically ill appearing and intubated and sedated Neck: over 10 cm JVD Cardiac: RRR, LVAD hum present Respiratory: Clear to auscultation bilaterally except rales in the bases GI: Soft, nontender, non-distended  MS: No edema; No deformity. Neuro: sedated Psych: sedated  Labs    Chemistry Recent Labs  Lab 07/27/19 0400 07/25/2019 0405 07/30/19 1235 07/30/19 1235 08/01/2019 0315 07/26/2019 0322 07/22/2019 1706 08/01/19 0319 08/01/19 0322  NA 133*   < > 124*   < > 127*   < >  132* 130* 128*  K 4.1   < > 3.8   < > 3.5   < > 4.0 4.3 4.2  CL 101   < > 95*  --  97*  --   --   --  100  CO2 24   < > 19*  --  19*  --   --   --  19*  GLUCOSE 99   < > 125*  --  138*  --   --   --  131*  BUN 13   < > 24*  --  28*  --   --   --  26*  CREATININE 0.97   < > 1.01  --  1.20  --   --   --  0.93  CALCIUM 8.5*   < > 7.9*  --  8.0*  --   --   --  8.1*  PROT 6.0*  --   --   --   --   --   --   --  6.0*  ALBUMIN 2.8*  --   --   --   --   --   --   --  2.2*  AST 67*  --   --   --   --   --   --   --  60*  ALT 104*  --   --   --   --   --   --   --  71*  ALKPHOS 117  --   --   --   --   --   --   --  157*  BILITOT 1.0  --   --   --   --   --   --   --  1.0  GFRNONAA >60   < > >60  --  >60  --   --   --  >60  GFRAA >60   < > >60  --  >60  --   --   --  >60  ANIONGAP 8   < > 10  --  11  --   --   --  9     < > = values in this interval not displayed.     Hematology Recent Labs  Lab 07/30/19 0437 07/30/19 1047 07/07/2019 0315 07/12/2019 0322 07/16/2019 1706 08/01/19 0319 08/01/19 0322  WBC 8.6  --  11.4*  --   --   --  10.8*  RBC 3.39*  --  3.50*  --   --   --  3.26*  HGB 10.0*   < > 10.0*   < > 10.5* 9.9* 9.4*  HCT 31.4*   < > 32.2*   < > 31.0* 29.0* 30.5*  MCV 92.6  --  92.0  --   --   --  93.6  MCH 29.5  --  28.6  --   --   --  28.8  MCHC 31.8  --  31.1  --   --   --  30.8  RDW 16.6*  --  16.8*  --   --   --  16.8*  PLT 159  --  176  --   --   --  153   < > = values in this interval not displayed.    Cardiac EnzymesNo results for input(s): TROPONINI in the last 168 hours. No results for input(s): TROPIPOC in the last 168 hours.   BNPNo results for input(s): BNP, PROBNP in  the last 168 hours.   DDimer No results for input(s): DDIMER in the last 168 hours.   Radiology    CARDIAC CATHETERIZATION  Result Date: 07/30/2019 Successful TVP and IABP placement. Glori Bickers, MD 11:30 AM  Portable Chest xray  Result Date: 07/08/2019 CLINICAL DATA:  Hypoxia EXAM: PORTABLE CHEST 1 VIEW COMPARISON:  July 30, 2019. FINDINGS: Endotracheal tube tip is 3.8 cm above the carina. Nasogastric tube tip and side port are below the diaphragm. Central catheter tip is in the superior vena cava near the cavoatrial junction. There is a left ventricular assist device present. No pneumothorax. Small left pleural effusion with left and right base atelectatic change remain. No new opacity evident. Stable cardiomegaly with pulmonary vascularity normal. No adenopathy. Status post median sternotomy. IMPRESSION: Tube and catheter positions as described without pneumothorax. Stable cardiomegaly with left ventricular assist device present. Stable small left pleural effusion with bibasilar atelectasis. No new opacity evident. Electronically Signed   By: Lowella Grip III M.D.   On: 07/30/2019 07:03   Portable  Chest x-ray  Result Date: 07/30/2019 CLINICAL DATA:  Hypoxia EXAM: PORTABLE CHEST 1 VIEW COMPARISON:  July 16, 2019 FINDINGS: Endotracheal tube tip is 2.0 cm above the carina. Nasogastric tube tip and side port are below the diaphragm. Central catheter tip is in the superior vena cava. No pneumothorax. There is a left ventricular assist device present. There is a small left pleural effusion with atelectatic change in the lung bases. Lungs elsewhere are clear. There is cardiomegaly with pulmonary vascularity normal. No adenopathy. Status post median sternotomy. No evident bone lesions. IMPRESSION: Tube and catheter positions as described without pneumothorax. There is cardiomegaly, stable, with left ventricular assist device. Bibasilar atelectasis a small left pleural effusion. Lungs elsewhere clear. Electronically Signed   By: Lowella Grip III M.D.   On: 07/30/2019 09:47    Cardiac Studies   reviewed  Patient Profile     54 y.o. male admitted with refractory heart failure/VF, s/p LVAD  Assessment & Plan    1. Recurrent VF - continue IV amiodarone, IV lidocaine. Watch for lidocaine toxicity. 2. Bradycardia - today he is NSR in the 60's. Would allow him to conduct as he is able rather than V pace as hemodynamics almost certainly better.  3. Systolic heart failure - as per CHF team. Co-ox 58.  4. ICM - reviewed. Note now good revascularization options.   Bilaal Leib,M.D.  For questions or updates, please contact [facility] Please consult [email or web address] for contact info under Cardiology/STEMI.      Signed, Cristopher Peru, MD  08/01/2019, 8:37 AM  Patient ID: Chester Holstein, male   DOB: [birth year], 54 y.o.   MRN: [id number]

## 2019-08-01 NOTE — Progress Notes (Signed)
Summary: Pt had 3 episodes of VT/VF. Received one amio bolus and was shocked total of 4 times.   Timeline: 0602 - VT/VF Given PRN fentanyl+versed per [facility], waited for pt to be fully sedated 0607 - defib 120J, back to paced rhythm  0612 - VT/VF 0613 - pt still fully sedated, so defib 120J, back to paced rhythm  Dr. Aundra Dubin notified of events, ordered 150mg  Amio bolus. Bolus given (see MAR)  0629 - VT 0631 - attempted sync shock 100J, unsuccessful, now in VF Given another dose fentanyl+versed (see MAR) 6734 - defib 120J, back to paced rhythm   Dr. Aundra Dubin updated about subsequent events during bedside rounds at 0730.  CV strips saved, printed, and placed in shadow chart.  Henreitta Leber, RN 08/01/19

## 2019-08-01 NOTE — Plan of Care (Signed)
  Problem: Activity: Goal: Capacity to carry out activities will improve Outcome: Not Progressing Note: Plan of care to have patient rest without much stimulation d/t recent instability and dysrhythmias   Problem: Cardiac: Goal: Ability to achieve and maintain adequate cardiopulmonary perfusion will improve Outcome: Progressing   Problem: Clinical Measurements: Goal: Ability to maintain clinical measurements within normal limits will improve Outcome: Progressing Goal: Will remain free from infection Outcome: Progressing Goal: Diagnostic test results will improve Outcome: Progressing Goal: Respiratory complications will improve Outcome: Progressing Goal: Cardiovascular complication will be avoided Outcome: Progressing   Problem: Activity: Goal: Risk for activity intolerance will decrease Outcome: Not Progressing   Problem: Nutrition: Goal: Adequate nutrition will be maintained Outcome: Progressing   Problem: Coping: Goal: Level of anxiety will decrease Outcome: Progressing   Problem: Elimination: Goal: Will not experience complications related to bowel motility Outcome: Not Progressing Note: Pt on scheduled Colace, monitoring bowel function Goal: Will not experience complications related to urinary retention Outcome: Not Progressing Note: Pt has Foley at this time   Problem: Pain Managment: Goal: General experience of comfort will improve Outcome: Progressing   Problem: Safety: Goal: Ability to remain free from injury will improve Outcome: Progressing   Problem: Skin Integrity: Goal: Risk for impaired skin integrity will decrease Outcome: Progressing

## 2019-08-01 NOTE — Progress Notes (Addendum)
Patient ID: Aaron [name], male   DOB: 18-Nov-1965, 54 y.o.   MRN: [id number]   Advanced Heart Failure VAD Team Note  PCP-Cardiologist: No primary care provider on file.   Subjective:    - Had refractory VF throughout the night 7/28 w/ ~10 shocks. Now intubated.  - Developed bradycardia and further VF 7/30 am, TTVP + IABP placed 7/30 am.    VF with shocks yesterday evening/afternoon, had quiet night then several more episodes around 6 am today with shocks.  Currently v-paced at 58.  Amiodarone at 60 mg/hr, lidocaine at 1.  Lidocaine level 3.3.   IABP 1:1, MAP around 70.  Co-ox 58%, CVP 18.  I/Os positive.   ?Component of septic shock, remains on cefepime.  Tm 100.8 yesterday evening, currently afebrile.  WBCs 10.8.  No pressors.   LVAD INTERROGATION:  HeartMate 3 LVAD:   Flow 4.4  liters/min, speed 5200, power 3.6 PI 3.8. Multiple PI events since IABP placed.   Objective:    Vital Signs:   Temp:  [99.3 F (37.4 C)-101.5 F (38.6 C)] 99.5 F (37.5 C) (07/31 0734) Pulse Rate:  [47-97] 89 (07/31 0734) Resp:  [10-24] 17 (07/31 0734) BP: (91-133)/(50-95) 99/65 (07/31 0000) SpO2:  [87 %-100 %] 98 % (07/31 0734) Arterial Line BP: (91-137)/(27-82) 98/49 (07/31 0734) FiO2 (%):  [60 %-100 %] 60 % (07/31 0400) Weight:  [94.8 kg] 94.8 kg (07/31 0700) Last BM Date: 07/27/19 Mean arterial Pressure 70-80s  Intake/Output:   Intake/Output Summary (Last 24 hours) at 08/01/2019 0739 Last data filed at 08/01/2019 0700 Gross per 24 hour  Intake 3605.32 ml  Output 1905 ml  Net 1700.32 ml     Physical Exam    CVP 18 General: Intubated/sedated.  HEENT: Normal. Neck: Supple, JVP 16 cm. Carotids OK.  Cardiac:  Mechanical heart sounds with LVAD hum present.  Lungs:  Crackles at bases.  Abdomen:  NT, ND, no HSM. No bruits or masses. +BS  LVAD exit site: Well-healed and incorporated. Dressing dry and intact. No erythema or drainage. Stabilization device present and accurately applied.  Driveline dressing changed daily per sterile technique. Extremities:  Warm and dry. No cyanosis, clubbing, rash, or edema.  Neuro:  Sedated.      Telemetry   Several episodes of VF requiring defibrillation. Now NSR, v-paced 80 (personally reviewed).   Labs   Basic Metabolic Panel: Recent Labs  Lab 07/04/2019 2222 08/01/2019 2222 07/03/2019 0325 07/13/2019 0325 07/18/2019 0409 07/26/2019 1742 07/30/19 0437 07/30/19 1047 07/30/19 1235 07/30/19 1235 07/30/19 1730 07/30/2019 0315 07/19/2019 0322 07/19/2019 0865 07/11/2019 0935 07/30/2019 1706 08/01/19 0319 08/01/19 0322  NA 129*   < > 125*   < >   < >  --  127*   < > 124*   < >  --  127*   < > 129* 131* 132* 130* 128*  K 4.6   < > 4.7   < >   < >  --  3.1*   < > 3.8   < >  --  3.5   < > 3.8 4.0 4.0 4.3 4.2  CL 96*   < > 95*  --   --   --  95*  --  95*  --   --  97*  --   --   --   --   --  100  CO2 22   < > 18*  --   --   --  21*  --  19*  --   --  19*  --   --   --   --   --  19*  GLUCOSE 180*   < > 282*  --   --   --  138*  --  125*  --   --  138*  --   --   --   --   --  131*  BUN 13   < > 13  --   --   --  21*  --  24*  --   --  28*  --   --   --   --   --  26*  CREATININE 1.04   < > 0.95  --   --   --  0.95  --  1.01  --   --  1.20  --   --   --   --   --  0.93  CALCIUM 8.6*   < > 7.4*   < >  --   --  8.3*  --  7.9*  --   --  8.0*  --   --   --   --   --  8.1*  MG 1.8  --  2.4  --   --   --  1.9  --   --   --   --  2.0  --   --   --   --   --  2.5*  PHOS  --   --   --   --   --  4.5 3.6  --   --   --  3.4 3.7  --   --   --   --   --   --    < > = values in this interval not displayed.    Liver Function Tests: Recent Labs  Lab 07/27/19 0400 08/01/19 0322  AST 67* 60*  ALT 104* 71*  ALKPHOS 117 157*  BILITOT 1.0 1.0  PROT 6.0* 6.0*  ALBUMIN 2.8* 2.2*   No results for input(s): LIPASE, AMYLASE in the last 168 hours. No results for input(s): AMMONIA in the last 168 hours.  CBC: Recent Labs  Lab 07/27/19 0400 07/27/19 0400  07/08/2019 0325 08/01/2019 0409 07/30/19 0437 07/30/19 1047 07/23/2019 0315 07/29/2019 0322 07/09/2019 0814 07/12/2019 0935 07/20/2019 1706 08/01/19 0319 08/01/19 0322  WBC 8.6  --  14.1*  --  8.6  --  11.4*  --   --   --   --   --  10.8*  NEUTROABS 6.3  --   --   --   --   --   --   --   --   --   --   --   --   HGB 10.7*   < > 9.7*   < > 10.0*   < > 10.0*   < > 10.5* 11.2* 10.5* 9.9* 9.4*  HCT 34.4*   < > 29.8*   < > 31.4*   < > 32.2*   < > 31.0* 33.0* 31.0* 29.0* 30.5*  MCV 95.6  --  94.6  --  92.6  --  92.0  --   --   --   --   --  93.6  PLT 215  --  160  --  159  --  176  --   --   --   --   --  153   < > = values in this interval not displayed.    INR: Recent Labs  Lab 07/14/2019 0705 07/30/19 0437 07/11/2019 0315 07/18/2019 1233 08/01/19 0322  INR 2.1* 3.5* 2.2* 2.0* 1.5*    Other results:    Imaging   CARDIAC CATHETERIZATION  Result Date: 08/01/2019 Successful TVP and IABP placement. Glori Bickers, MD 11:30 AM  Portable Chest xray  Result Date: 07/04/2019 CLINICAL DATA:  Hypoxia EXAM: PORTABLE CHEST 1 VIEW COMPARISON:  July 30, 2019. FINDINGS: Endotracheal tube tip is 3.8 cm above the carina. Nasogastric tube tip and side port are below the diaphragm. Central catheter tip is in the superior vena cava near the cavoatrial junction. There is a left ventricular assist device present. No pneumothorax. Small left pleural effusion with left and right base atelectatic change remain. No new opacity evident. Stable cardiomegaly with pulmonary vascularity normal. No adenopathy. Status post median sternotomy. IMPRESSION: Tube and catheter positions as described without pneumothorax. Stable cardiomegaly with left ventricular assist device present. Stable small left pleural effusion with bibasilar atelectasis. No new opacity evident. Electronically Signed   By: Lowella Grip III M.D.   On: 07/03/2019 07:03   Portable Chest x-ray  Result Date: 07/30/2019 CLINICAL DATA:  Hypoxia EXAM:  PORTABLE CHEST 1 VIEW COMPARISON:  July 16, 2019 FINDINGS: Endotracheal tube tip is 2.0 cm above the carina. Nasogastric tube tip and side port are below the diaphragm. Central catheter tip is in the superior vena cava. No pneumothorax. There is a left ventricular assist device present. There is a small left pleural effusion with atelectatic change in the lung bases. Lungs elsewhere are clear. There is cardiomegaly with pulmonary vascularity normal. No adenopathy. Status post median sternotomy. No evident bone lesions. IMPRESSION: Tube and catheter positions as described without pneumothorax. There is cardiomegaly, stable, with left ventricular assist device. Bibasilar atelectasis a small left pleural effusion. Lungs elsewhere clear. Electronically Signed   By: Lowella Grip III M.D.   On: 07/30/2019 09:47     Medications:     Scheduled Medications: . aspirin  81 mg Per Tube Daily  . atorvastatin  80 mg Per Tube Daily  . chlorhexidine gluconate (MEDLINE KIT)  15 mL Mouth Rinse BID  . Chlorhexidine Gluconate Cloth  6 each Topical Daily  . clonazePAM  0.5 mg Per Tube BID  . digoxin  0.125 mg Per Tube Daily  . docusate  100 mg Per Tube BID  . feeding supplement (PROSource TF)  90 mL Per Tube TID  . folic acid  1 mg Per Tube Daily  . free water  20 mL Per Tube Q4H  . furosemide  80 mg Intravenous BID  . furosemide  80 mg Intravenous BID  . gabapentin  300 mg Per Tube Q8H  . mouth rinse  15 mL Mouth Rinse 10 times per day  . multivitamin  15 mL Per Tube Daily  . pantoprazole sodium  40 mg Per Tube Daily  . polyethylene glycol  17 g Oral Daily  . sildenafil  20 mg Per Tube TID  . sodium chloride flush  10-40 mL Intracatheter Q12H  . thiamine  100 mg Per Tube Daily    Infusions: . sodium chloride    . sodium chloride Stopped (08/01/19 0653)  . sodium chloride 20 mL/hr at 08/01/19 0700  . amiodarone 60 mg/hr (08/01/19 0700)  . ceFEPime (MAXIPIME) IV Stopped (08/01/19 0612)  . feeding  supplement (VITAL AF 1.2 CAL) 1,000 mL (08/01/19 0330)  . fentaNYL infusion INTRAVENOUS 200 mcg/hr (08/01/19 0700)  . heparin 500 Units/hr (08/01/19 0700)  . lidocaine 1 mg/min (  08/01/19 0700)  . phenylephrine (NEO-SYNEPHRINE) Adult infusion Stopped (07/30/19 2100)  . potassium chloride    . potassium chloride    . vasopressin Stopped (07/04/2019 0051)    PRN Medications: Place/Maintain arterial line **AND** sodium chloride, sodium chloride, acetaminophen, fentaNYL, midazolam, sodium chloride flush   Assessment/Plan:    1. Refractory Ventricular Fibrillation  -multiple episodes ~10 of VF on 7/28 - multiple episodes 7/30 am => IABP placed as well as TTVP given relative bradycardia.  Continued to have VF 7/30 afternoon and also 7/31 early am.  - Ranexa and Mexiletine currently on hold (unable to crush and administer through tube). Plan to resume Ranexa 500 mg bid + Mexiletine 200 mg bid once extubated  - Continue lidocaine gtt at 1, level ok.  - Continue amiodarone gtt at 60 mg/hr.   - Sotalol not a good option given his history of bradycardia.  - Underlying rhythm = NSR in 60s.  I decreased his backup pacing rate this morning to 45 bpm to see if allowing NSR in 60s would help. V-pacing at faster rate does not appear to be suppressing VF/VT.  - Keep K > 4.0 and Mg > 2.0. - Bedside echo 7/28 cannula ok.  MAP improved post-IABP, wonder if we may actually be able to increase LVAD speed eventually to improve output and decrease drive for VT.  For now, will continue IABP with no LVAD speed changes.  - Keep sedated today.   2. Acute Systolic Heart Failure s/p VAD - EF 03%, RV systolic function mildly reduced - HMIII LVAD placed 06/25/19.  - RV looks good on echo, cannula ok  - Co-ox 58% - avoid inotropes with VF - Heparin gtt with IABP, off warfarin.  - Volume overloaded with rhonchorous lungs, CVP 18.  Will give Lasix 80 mg IV bid x 2 doses with K repletion, repeat BMET in pm.  -  Native rhythm NSR around 65, will decrease pacing rate back to 45.  - As above, with improved MAP on IABP, wonder if we will be able to increase speed carefully in the future.   3. Acute hypoxic resp failure - intubated in setting of VF - vent management per PCCM  - Diuresis.  - Keep sedated today.   4. CAD - S/p Acute STEMI 6/21.Emergent cath showed severe multivessel disease w/ 75% mid- distal LM, 100% prox LAD, not amendable to PCI (unable to cross w/ guide wire, probable CTO), 100% prox LCx (probable CTO) and 100% ostial RCA (probable CTO). There were mildto moderate collaterals right to left to the LAD, minimal right to right collateralsan minimalleft to left collaterals.LVEF 25% -Hs trop peaked 27,000. - No targets for CABG / PCI.  - S/P HMIII VAD - Continue ASA + statin   5. Renal mass = RCC - MRI abdomen- 1.8 cm right renal lesion, most consistent with renal cell carcinoma. No evidence of renal vein involvement or nodal metastasis. - Discussed w/ Urology and R RCC is small and will just require surveillance. Ok to proceed with VAD from their standpoint.   6. Urinary retention - Foley placed - UA + for UTI. On abx  7. ID:  - ?HCAP => Klebsiella in trach aspirate from 7/29.  - Cefepime ongoing, febrile to 100.8 yesterday afternoon currently afebrile.   I reviewed the LVAD parameters from today, and compared the results to the patient's prior recorded data.  No programming changes were made.  The LVAD is functioning within specified parameters.  The patient performs  LVAD self-test daily.  LVAD interrogation was negative for any significant power changes, alarms or PI events/speed drops.  LVAD equipment check completed and is in good working order.  Back-up equipment present.   LVAD education done on emergency procedures and precautions and reviewed exit site care.   CRITICAL CARE Performed by: Loralie Champagne  Total critical care time: 45 minutes  Critical care time  was exclusive of separately billable procedures and treating other patients.  Critical care was necessary to treat or prevent imminent or life-threatening deterioration.  Critical care was time spent personally by me (independent of midlevel providers or residents) on the following activities: development of treatment plan with patient and/or surrogate as well as nursing, discussions with consultants, evaluation of patient's response to treatment, examination of patient, obtaining history from patient or surrogate, ordering and performing treatments and interventions, ordering and review of laboratory studies, ordering and review of radiographic studies, pulse oximetry and re-evaluation of patient's condition.   Loralie Champagne, MD  7:39 AM  08/01/2019

## 2019-08-01 NOTE — Progress Notes (Signed)
[facility] for Heparin Indication: LVAD>IABP  Allergies  Allergen Reactions  . Codeine Hives    Patient Measurements: Height: 5\' 9"  (175.3 cm) Weight: (!) 94.8 kg (208 lb 15.9 oz) IBW/kg (Calculated) : 70.7 Heparin Dosing Weight: ~ 92 kg  Vital Signs: Temp: 99.1 F (37.3 C) (07/31 0900) Temp Source: Bladder (07/31 0800) BP: 99/65 (07/31 0000) Pulse Rate: 61 (07/31 0900)  Labs: Recent Labs    07/30/19 0437 07/30/19 0437 07/30/19 1047 07/30/19 1235 07/30/2019 0315 07/04/2019 0322 07/16/2019 0935 07/14/2019 1233 07/30/2019 1706 07/12/2019 1706 08/01/19 0319 08/01/19 0322  HGB 10.0*   < >   < >  --  10.0*   < >   < >  --  10.5*   < > 9.9* 9.4*  HCT 31.4*   < >   < >  --  32.2*   < >   < >  --  31.0*  --  29.0* 30.5*  PLT 159  --   --   --  176  --   --   --   --   --   --  153  LABPROT 33.9*   < >  --   --  23.5*  --   --  21.8*  --   --   --  17.6*  INR 3.5*   < >  --   --  2.2*  --   --  2.0*  --   --   --  1.5*  HEPARINUNFRC  --   --   --   --   --   --   --   --   --   --   --  <0.10*  CREATININE 0.95  --   --  1.01 1.20  --   --   --   --   --   --  0.93   < > = values in this interval not displayed.    Estimated Creatinine Clearance: 101.9 mL/min (by C-G formula based on SCr of 0.93 mg/dL).  Assessment: 54 yo male s/p LVAD HM3 placement 6/24, followed by IABP 7/8-7/13.    INR now down to 1.5 this morning after IABP emergently placed due to recurrent arrhythmias yesterday. Will continue low dose heparin without titrations today. Hgb trending down to 9.4, plt count ok. LDH trending up 358.   Goal of Therapy:  Heparin level goal <0.3 INR 2-2.5  Monitor platelets by anticoagulation protocol: Yes   Plan:  Continue IV heparin at 500 units/hr, no bolusing, no titrations at this time Follow INR closely Check heparin level daily to ensure no accumulation  Erin Hearing PharmD., BCPS Clinical Pharmacist 08/01/2019 9:03 AM

## 2019-08-01 NOTE — Progress Notes (Signed)
NAME:  Aaron [name], MRN:  [id number], DOB:  22-Aug-1965, LOS: 4 ADMISSION DATE:  07/17/2019, CONSULTATION DATE:  07/30/19 REFERRING MD:  Heart Failure, CHIEF COMPLAINT:  Respiratory failure -- VF, LVAD   Brief History   54 yo PMH acute systolic heart failure s/p LVAD (06/25/19), recurrent VT/VF/Torsades, multivessel CAD, R renal mass c/f RCC, suspected OSA, who presents back to ICU after prolonged hospital course after which he was transferred to [facility] 7/23. At CIR, pt developed dyspnea, hypoxia following PT 7/27. ECG obtained and reveals VF rate 210 bpm. Transferred to CVICU 7/27 for emergent DCCV. Intubated by anesthesia. DCCV performed numerous times 7/27, 7/28. Remains intubated requiring frequent cardioversion.  PCCM consulted 7/29 for vent management   Past Medical History  Acute systolic heart failure s/p LVAD CAD Recurrent VF, VT, torsades  Renal mass Debility Sleep apnea   Significant Hospital Events   6/16 admitted with acute anterolateral STEMI, cardiogenic shock requiring IABP 6/25 LVAD placement 7/03 VT/VT/Torsades requiring multiple emergent DCCV  7/06 recurrent VT/VT/Torsades requiring multiple emergent DCCV  7/07 recurrent VT/VT/Torsades requiring multiple emergent DCCV  7/08 PNA, respiratory failure, required intubation 7/13 IABP removed, extubated 7/23 discharred to CIR 7/27 recurrent VF, admit to CVICU for DCCV. Intubated 7/28 recurrent VF, DCCV multiple times 7/29 PCCM consult for vent  7/30 vt overnight defib x2  Consults:  EP PCCM  Procedures:  ETT 7/27 >> Art line 7/28 >> RUE PICC 6/28 >>  R Femoral Venous Sheath 7/30 >>  IABP 7/30 >>   Significant Diagnostic Tests:    Micro Data:  7/28 MRSA > neg 7/28 BCx >>  7/28 UCx >>   7/29 tracheal aspirate >> few klesiella pneumoniae >>   Antimicrobials:  Vanc 7/27 >> 7/29 Cefepime 7/27 >>   Interim history/subjective:  RN reports frequent DCCV required per shift  On fentanyl gtt only, no  continuous sedation / PRN versed  Tmax 99.5 / WBC 10.8  I/O UOP 1.9L, +2.6L in last 24 hours  Glucose range 126-145   Objective   Blood pressure 99/65, pulse 89, temperature 99.5 F (37.5 C), resp. rate 17, height 5\' 9"  (1.753 m), weight (!) 94.8 kg, SpO2 98 %. CVP:  [17 mmHg-22 mmHg] 18 mmHg  Vent Mode: PRVC FiO2 (%):  [60 %-100 %] 60 % Set Rate:  [18 bmp] 18 bmp Vt Set:  [560 mL] 560 mL PEEP:  [10 cmH20] 10 cmH20 Plateau Pressure:  [21 cmH20-31 cmH20] 28 cmH20   Intake/Output Summary (Last 24 hours) at 08/01/2019 1062 Last data filed at 08/01/2019 0700 Gross per 24 hour  Intake 3605.32 ml  Output 1905 ml  Net 1700.32 ml   Filed Weights   07/30/19 0500 07/15/2019 0500 08/01/19 0700  Weight: 89.1 kg 91.1 kg (!) 94.8 kg    Examination: General: critically ill appearing adult male lying in bed on vent - on pressors, LVAD, IABP HEENT: MM pink/moist, ETT, OGT Neuro: opens eyes to voice / light touch CV: s1s2 regular, LVAD hum, no m/r/g PULM: non-labored on vent, lungs bilaterally coarse GI: soft, bsx4 active  Extremities: warm/dry, dependent edema in BUE's Skin: no rashes or lesions  Resolved Hospital Problem list   Shock   Assessment & Plan:   Acute Hypoxic Respiratory Failure in setting of Recurrent VF Klebsiella Pneumoniae PNA -PRVC 8cc/kg  -VAP prevention measures -no SBT 7/31 due to frequent VT  -will likely require 24-48 hours cardiac stabilization before considering SBT/extubation   Sedation Needs for Mechanical Ventilation  -PAD protocol  for RASS goal -1 to -2 -add versed gtt given frequent DCCV -increase ceiling on fentanyl gtt   Recurrent VFib requiring repeated DCCV -Cardiology / EP following  -defer IABP, LVAD, rhythm control to Cardiology  -continue lidocaine, amiodarone, digoxin -optimize electrolytes, goal K>4, Mg>2  Acute systolic heart failure s/p LVAD placement (06/25/19) -per CHF team  -continue supportive measures  -lasix 80 mg BID per CHF  team    Sepsis due to UTI, Klebsiella Pneumoniae PNA -follow cultures to maturity  -continue abx as above, await sensitives before narrowing abx   Hypokalemia Hyponatremia Hypomagnesemia  -follow electrolytes, replace as indicated  -goal K>4, Mg>2 given arrhythmias   Coagulopathy, on warfarin -heparin gtt per pharmacy   Best practice:  Diet: TF  Pain/Anxiety/Delirium protocol (if indicated): Fentanyl + Versed gtt VAP protocol (if indicated): yes DVT prophylaxis: heparin per pharmacy  GI prophylaxis: protonix  Glucose control: monitor  Mobility: BR -will need PT/OT, likely CIR again when critical illness has improved  Code Status: Full  Family Communication: Per primary Disposition: ICU   Labs   CBC: Recent Labs  Lab 07/27/19 0400 07/27/19 0400 07/26/2019 0325 07/20/2019 0409 07/30/19 0437 07/30/19 1047 07/30/2019 0315 07/17/2019 0322 07/26/2019 0814 07/27/2019 0935 07/19/2019 1706 08/01/19 0319 08/01/19 0322  WBC 8.6  --  14.1*  --  8.6  --  11.4*  --   --   --   --   --  10.8*  NEUTROABS 6.3  --   --   --   --   --   --   --   --   --   --   --   --   HGB 10.7*   < > 9.7*   < > 10.0*   < > 10.0*   < > 10.5* 11.2* 10.5* 9.9* 9.4*  HCT 34.4*   < > 29.8*   < > 31.4*   < > 32.2*   < > 31.0* 33.0* 31.0* 29.0* 30.5*  MCV 95.6  --  94.6  --  92.6  --  92.0  --   --   --   --   --  93.6  PLT 215  --  160  --  159  --  176  --   --   --   --   --  153   < > = values in this interval not displayed.    Basic Metabolic Panel: Recent Labs  Lab 07/27/2019 2222 07/03/2019 2222 07/06/2019 0325 07/17/2019 0409 07/24/2019 1742 07/30/19 0437 07/30/19 1047 07/30/19 1235 07/30/19 1235 07/30/19 1730 07/30/2019 0315 07/07/2019 0322 07/22/2019 6301 07/28/2019 0935 07/17/2019 1706 08/01/19 0319 08/01/19 0322  NA 129*   < > 125*   < >  --  127*   < > 124*   < >  --  127*   < > 129* 131* 132* 130* 128*  K 4.6   < > 4.7   < >  --  3.1*   < > 3.8   < >  --  3.5   < > 3.8 4.0 4.0 4.3 4.2  CL 96*   < >  95*  --   --  95*  --  95*  --   --  97*  --   --   --   --   --  100  CO2 22   < > 18*  --   --  21*  --  19*  --   --  19*  --   --   --   --   --  19*  GLUCOSE 180*   < > 282*  --   --  138*  --  125*  --   --  138*  --   --   --   --   --  131*  BUN 13   < > 13  --   --  21*  --  24*  --   --  28*  --   --   --   --   --  26*  CREATININE 1.04   < > 0.95  --   --  0.95  --  1.01  --   --  1.20  --   --   --   --   --  0.93  CALCIUM 8.6*   < > 7.4*  --   --  8.3*  --  7.9*  --   --  8.0*  --   --   --   --   --  8.1*  MG 1.8  --  2.4  --   --  1.9  --   --   --   --  2.0  --   --   --   --   --  2.5*  PHOS  --   --   --   --  4.5 3.6  --   --   --  3.4 3.7  --   --   --   --   --   --    < > = values in this interval not displayed.   GFR: Estimated Creatinine Clearance: 101.9 mL/min (by C-G formula based on SCr of 0.93 mg/dL). Recent Labs  Lab 07/23/2019 0325 07/22/2019 0329 07/23/2019 0705 07/30/19 0437 07/30/19 1200 07/03/2019 0315 08/01/19 0322  WBC 14.1*  --   --  8.6  --  11.4* 10.8*  LATICACIDVEN  --  1.5 2.2*  --  1.9  --   --     Liver Function Tests: Recent Labs  Lab 07/27/19 0400 08/01/19 0322  AST 67* 60*  ALT 104* 71*  ALKPHOS 117 157*  BILITOT 1.0 1.0  PROT 6.0* 6.0*  ALBUMIN 2.8* 2.2*   No results for input(s): LIPASE, AMYLASE in the last 168 hours. No results for input(s): AMMONIA in the last 168 hours.  ABG    Component Value Date/Time   PHART 7.359 08/01/2019 0319   PCO2ART 38.1 08/01/2019 0319   PO2ART 82 (L) 08/01/2019 0319   HCO3 21.4 08/01/2019 0319   TCO2 22 08/01/2019 0319   ACIDBASEDEF 4.0 (H) 08/01/2019 0319   O2SAT 58.3 08/01/2019 0330     Coagulation Profile: Recent Labs  Lab 07/27/2019 0705 07/30/19 0437 07/29/2019 0315 07/09/2019 1233 08/01/19 0322  INR 2.1* 3.5* 2.2* 2.0* 1.5*    Cardiac Enzymes: No results for input(s): CKTOTAL, CKMB, CKMBINDEX, TROPONINI in the last 168 hours.  HbA1C: Hgb A1c MFr Bld  Date/Time Value Ref Range  Status  06/22/2019 11:59 AM 5.4 4.8 - 5.6 % Final    Comment:    (NOTE)         Prediabetes: 5.7 - 6.4         Diabetes: >6.4         Glycemic control for adults with diabetes: <7.0     CBG: Recent Labs  Lab 07/18/2019 1532 07/10/2019 1930 08/01/19 0028 08/01/19 0319 08/01/19 0723  GLUCAP 115* 142*  134* 126* 145*      Critical care time: 33 minutes     Noe Gens, MSN, NP-C [facility] 08/01/2019, 8:33 AM   Please see [email or web address] for pager details.

## 2019-08-02 ENCOUNTER — Inpatient Hospital Stay (HOSPITAL_COMMUNITY): Payer: BC Managed Care – PPO

## 2019-08-02 LAB — CBC
HCT: 28.3 % — ABNORMAL LOW (ref 39.0–52.0)
HCT: 27.9 % — ABNORMAL LOW (ref 39.0–52.0)
Hemoglobin: 8.7 g/dL — ABNORMAL LOW (ref 13.0–17.0)
Hemoglobin: 8.8 g/dL — ABNORMAL LOW (ref 13.0–17.0)
MCH: 29.2 pg (ref 26.0–34.0)
MCH: 29.3 pg (ref 26.0–34.0)
MCHC: 30.7 g/dL (ref 30.0–36.0)
MCHC: 31.5 g/dL (ref 30.0–36.0)
MCV: 95 fL (ref 80.0–100.0)
MCV: 93 fL (ref 80.0–100.0)
Platelets: 152 10*3/uL (ref 150–400)
Platelets: 152 10*3/uL (ref 150–400)
RBC: 2.98 MIL/uL — ABNORMAL LOW (ref 4.22–5.81)
RBC: 3 MIL/uL — ABNORMAL LOW (ref 4.22–5.81)
RDW: 17.1 % — ABNORMAL HIGH (ref 11.5–15.5)
RDW: 17.1 % — ABNORMAL HIGH (ref 11.5–15.5)
WBC: 9.6 10*3/uL (ref 4.0–10.5)
WBC: 9.7 10*3/uL (ref 4.0–10.5)
nRBC: 0 % (ref 0.0–0.2)
nRBC: 0.3 % — ABNORMAL HIGH (ref 0.0–0.2)

## 2019-08-02 LAB — BASIC METABOLIC PANEL
Anion gap: 10 (ref 5–15)
Anion gap: 11 (ref 5–15)
BUN: 42 mg/dL — ABNORMAL HIGH (ref 6–20)
BUN: 50 mg/dL — ABNORMAL HIGH (ref 6–20)
CO2: 19 mmol/L — ABNORMAL LOW (ref 22–32)
CO2: 18 mmol/L — ABNORMAL LOW (ref 22–32)
Calcium: 7.9 mg/dL — ABNORMAL LOW (ref 8.9–10.3)
Calcium: 7.9 mg/dL — ABNORMAL LOW (ref 8.9–10.3)
Chloride: 100 mmol/L (ref 98–111)
Chloride: 100 mmol/L (ref 98–111)
Creatinine, Ser: 1.18 mg/dL (ref 0.61–1.24)
Creatinine, Ser: 1.36 mg/dL — ABNORMAL HIGH (ref 0.61–1.24)
GFR calc Af Amer: >60 mL/min (ref >60)
GFR calc Af Amer: >60 mL/min (ref >60)
GFR calc non Af Amer: >60 mL/min (ref >60)
GFR calc non Af Amer: 58 mL/min — ABNORMAL LOW (ref >60)
Glucose, Bld: 136 mg/dL — ABNORMAL HIGH (ref 70–99)
Glucose, Bld: 143 mg/dL — ABNORMAL HIGH (ref 70–99)
Potassium: 4 mmol/L (ref 3.5–5.1)
Potassium: 3.5 mmol/L (ref 3.5–5.1)
Sodium: 129 mmol/L — ABNORMAL LOW (ref 135–145)
Sodium: 129 mmol/L — ABNORMAL LOW (ref 135–145)

## 2019-08-02 LAB — COOXEMETRY PANEL
Carboxyhemoglobin: 1.7 % — ABNORMAL HIGH (ref 0.5–1.5)
Methemoglobin: 1.3 % (ref 0.0–1.5)
O2 Saturation: 69.6 %
Total hemoglobin: 8.2 g/dL — ABNORMAL LOW (ref 12.0–16.0)

## 2019-08-02 LAB — GLUCOSE, CAPILLARY
Glucose-Capillary: 113 mg/dL — ABNORMAL HIGH (ref 70–99)
Glucose-Capillary: 139 mg/dL — ABNORMAL HIGH (ref 70–99)
Glucose-Capillary: 143 mg/dL — ABNORMAL HIGH (ref 70–99)
Glucose-Capillary: 149 mg/dL — ABNORMAL HIGH (ref 70–99)
Glucose-Capillary: 96 mg/dL (ref 70–99)

## 2019-08-02 LAB — TYPE AND SCREEN
ABO/RH(D): O NEG
Antibody Screen: NEGATIVE

## 2019-08-02 LAB — PROTIME-INR
INR: 1.5 — ABNORMAL HIGH (ref 0.8–1.2)
Prothrombin Time: 17.5 seconds — ABNORMAL HIGH (ref 11.4–15.2)

## 2019-08-02 LAB — HEPARIN LEVEL (UNFRACTIONATED)
Heparin Unfractionated: <0.1 IU/mL — ABNORMAL LOW (ref 0.30–0.70)
Heparin Unfractionated: <0.1 IU/mL — ABNORMAL LOW (ref 0.30–0.70)

## 2019-08-02 LAB — LIDOCAINE LEVEL: Lidocaine Lvl: 3.2 ug/mL (ref 1.5–5.0)

## 2019-08-02 LAB — LACTATE DEHYDROGENASE: LDH: 297 U/L — ABNORMAL HIGH (ref 98–192)

## 2019-08-02 LAB — MAGNESIUM: Magnesium: 2.2 mg/dL (ref 1.7–2.4)

## 2019-08-02 MED ORDER — METOLAZONE 2.5 MG PO TABS
2.5000 mg | ORAL_TABLET | Freq: Once | ORAL | Status: AC
  Administered 2019-08-02: 2.5 mg
  Filled 2019-08-02: qty 1

## 2019-08-02 MED ORDER — CEFAZOLIN SODIUM-DEXTROSE 2-4 GM/100ML-% IV SOLN
2.0000 g | Freq: Three times a day (TID) | INTRAVENOUS | Status: DC
  Administered 2019-08-02 – 2019-08-05 (×9): 2 g via INTRAVENOUS
  Filled 2019-08-02 (×10): qty 100

## 2019-08-02 MED ORDER — POTASSIUM CHLORIDE 10 MEQ/50ML IV SOLN
10.0000 meq | INTRAVENOUS | Status: AC
  Administered 2019-08-02 (×4): 10 meq via INTRAVENOUS
  Filled 2019-08-02 (×4): qty 50

## 2019-08-02 MED ORDER — FUROSEMIDE 10 MG/ML IJ SOLN
80.0000 mg | Freq: Two times a day (BID) | INTRAMUSCULAR | Status: DC
  Administered 2019-08-02 – 2019-08-03 (×4): 80 mg via INTRAVENOUS
  Filled 2019-08-02 (×4): qty 8

## 2019-08-02 MED ORDER — POTASSIUM CHLORIDE 20 MEQ/15ML (10%) PO SOLN
40.0000 meq | Freq: Once | ORAL | Status: AC
  Administered 2019-08-02: 40 meq
  Filled 2019-08-02: qty 30

## 2019-08-02 NOTE — Progress Notes (Signed)
RT note- Noted increase in secretion and color yellow and continued tan, consistency thick.

## 2019-08-02 NOTE — Progress Notes (Addendum)
Pt went into VF/VT, shocked with 120 J once into SR. Sedated on versed and fentanyl per MAR.  Cardiologist on call notified.

## 2019-08-02 NOTE — Progress Notes (Addendum)
NAME:  Aaron [name], MRN:  [id number], DOB:  [birth year], LOS: 5 ADMISSION DATE:  07/20/2019, CONSULTATION DATE:  07/30/19 REFERRING MD:  Heart Failure, CHIEF COMPLAINT:  Respiratory failure -- VF, LVAD   Brief History   54 yo PMH acute systolic heart failure s/p LVAD (06/25/19), recurrent VT/VF/Torsades, multivessel CAD, R renal mass c/f RCC, suspected OSA, who presents back to ICU after prolonged hospital course after which he was transferred to [facility] 7/23. At CIR, pt developed dyspnea, hypoxia following PT 7/27. ECG obtained and reveals VF rate 210 bpm. Transferred to CVICU 7/27 for emergent DCCV. Intubated by anesthesia. DCCV performed numerous times 7/27, 7/28. Remains intubated requiring frequent cardioversion.  PCCM consulted 7/29 for vent management   Past Medical History  Acute systolic heart failure s/p LVAD CAD Recurrent VF, VT, torsades  Renal mass Debility Sleep apnea   Significant Hospital Events   6/16 admitted with acute anterolateral STEMI, cardiogenic shock requiring IABP 6/25 LVAD placement 7/03 VT/VT/Torsades requiring multiple emergent DCCV  7/06 recurrent VT/VT/Torsades requiring multiple emergent DCCV  7/07 recurrent VT/VT/Torsades requiring multiple emergent DCCV  7/08 PNA, respiratory failure, required intubation 7/13 IABP removed, extubated 7/23 discharred to CIR 7/27 recurrent VF, admit to CVICU for DCCV. Intubated 7/28 recurrent VF, DCCV multiple times 7/29 PCCM consult for vent  7/30 vt overnight defib x2 7/31 DCCV x2, improved on versed / no further shocks  Consults:  EP PCCM  Procedures:  ETT 7/27 >> Art line 7/28 >> RUE PICC 6/28 >>  R Femoral Venous Sheath 7/30 >>  IABP 7/30 >>   Significant Diagnostic Tests:    Micro Data:  7/28 MRSA > neg 7/28 BCx >>  7/28 UCx >>   7/29 tracheal aspirate >> few klesiella pneumoniae >> R-ampicillin, S-cefazolin  Antimicrobials:  Vanc 7/27 >> 7/29 Cefepime 7/27 >> 8/1 Cefazolin 8/1 >>  Interim  history/subjective:  Tmax 99.3 / WBC 9.6 Glucose 96-149  I/O 1.9L UOP, +2.5L in last 24 hours  Remains on IV heparin, neosynephrine, versed, fentanyl, amio, lidocaine  IABP, Impella in place   Objective   Blood pressure 100/67, pulse 59, temperature 99.3 F (37.4 C), resp. rate (!) 27, height 5\' 9"  (1.753 m), weight (!) 95.1 kg, SpO2 96 %. CVP:  [13 mmHg-24 mmHg] 23 mmHg  Vent Mode: PRVC FiO2 (%):  [40 %-50 %] 40 % Set Rate:  [16 bmp-18 bmp] 18 bmp Vt Set:  [560 mL] 560 mL PEEP:  [8 cmH20] 8 cmH20 Plateau Pressure:  [24 cmH20-27 cmH20] 27 cmH20   Intake/Output Summary (Last 24 hours) at 08/02/2019 0718 Last data filed at 08/02/2019 0700 Gross per 24 hour  Intake 4468.26 ml  Output 1930 ml  Net 2538.26 ml   Filed Weights   07/10/2019 0500 08/01/19 0700 08/02/19 0615  Weight: 91.1 kg (!) 94.8 kg (!) 95.1 kg    Examination: General: adult male lying in bed in NAD on vent, critically ill appearing   HEENT: MM pink/moist, ETT Neuro: sedate CV: s1s2 difficult to auscultate due to impella / IABP noise  PULM: non-labored on vent, lungs bilaterally clear  GI: soft, bsx4 active  Extremities: warm/dry, improved but 1-2+ generalized edema  Skin: no rashes or lesions  PCXR - images personally reviewed, diffuse bilateral opacities, ETT in good position   Resolved Hospital Problem list   Shock   Assessment & Plan:   Acute Hypoxic Respiratory Failure in setting of Recurrent VF Klebsiella Pneumoniae PNA -PRVC 8cc/kg -VAP prevention measures  -no SBT 8/1  -  change abx to cefazolin, complete 7 days total  -consider WUA / SBT am 8/2 pending review with Cardiology   Sedation Needs for Mechanical Ventilation  -PAD protocol with versed, fentanyl gtt for RASS Goal -1 to -2  Recurrent VFib requiring repeated DCCV -Cardiology / EP following  -continue lidocaine, amiodarone, digoxin -optimize electrolytes, goal K>4, Mg>2  Acute systolic heart failure s/p LVAD placement  (06/25/19) -continue supportive measures    Sepsis due to UTI, Klebsiella Pneumoniae PNA -abx as above, narrow to cefazolin  -complete 7 days total   Hypokalemia Hyponatremia Hypomagnesemia  -follow electrolytes closely -goal K>4, Mg>2 in setting of VF   Coagulopathy, on warfarin -heparin per pharmacy > on set rate, no titration  At Risk Malnutrition in setting of Critical Illness -continue TF   Best practice:  Diet: TF  Pain/Anxiety/Delirium protocol (if indicated): Fentanyl + Versed gtt VAP protocol (if indicated): yes DVT prophylaxis: heparin per pharmacy  GI prophylaxis: protonix  Glucose control: monitor  Mobility: BR -will need PT/OT, likely CIR again when critical illness has improved  Code Status: Full  Family Communication: Per primary Disposition: ICU   Labs   CBC: Recent Labs  Lab 07/27/19 0400 07/27/19 0400 07/05/2019 0325 07/02/2019 0409 07/30/19 0437 07/30/19 1047 07/03/2019 0315 07/15/2019 0322 07/22/2019 0935 07/18/2019 1706 08/01/19 0319 08/01/19 0322 08/02/19 0421  WBC 8.6   < > 14.1*  --  8.6  --  11.4*  --   --   --   --  10.8* 9.6  NEUTROABS 6.3  --   --   --   --   --   --   --   --   --   --   --   --   HGB 10.7*   < > 9.7*   < > 10.0*   < > 10.0*   < > 11.2* 10.5* 9.9* 9.4* 8.7*  HCT 34.4*   < > 29.8*   < > 31.4*   < > 32.2*   < > 33.0* 31.0* 29.0* 30.5* 28.3*  MCV 95.6   < > 94.6  --  92.6  --  92.0  --   --   --   --  93.6 95.0  PLT 215   < > 160  --  159  --  176  --   --   --   --  153 152   < > = values in this interval not displayed.    Basic Metabolic Panel: Recent Labs  Lab 07/05/2019 0325 07/07/2019 0325 07/06/2019 0409 07/16/2019 1742 07/30/19 0437 07/30/19 1047 07/30/19 1235 07/30/19 1235 07/30/19 1730 07/29/2019 0315 07/09/2019 0322 07/16/2019 1706 08/01/19 0319 08/01/19 0322 08/01/19 1440 08/02/19 0421  NA 125*   < >   < >  --  127*   < > 124*   < >  --  127*   < > 132* 130* 128* 129* 129*  K 4.7   < >   < >  --  3.1*   < > 3.8    < >  --  3.5   < > 4.0 4.3 4.2 4.6 4.0  CL 95*   < >  --   --  95*  --  95*  --   --  97*  --   --   --  100 100 100  CO2 18*   < >  --   --  21*  --  19*  --   --  19*  --   --   --  19* 20* 19*  GLUCOSE 282*   < >  --   --  138*  --  125*  --   --  138*  --   --   --  131* 134* 136*  BUN 13   < >  --   --  21*  --  24*  --   --  28*  --   --   --  26* 32* 42*  CREATININE 0.95   < >  --   --  0.95  --  1.01  --   --  1.20  --   --   --  0.93 1.05 1.18  CALCIUM 7.4*   < >  --   --  8.3*  --  7.9*  --   --  8.0*  --   --   --  8.1* 7.9* 7.9*  MG 2.4  --   --   --  1.9  --   --   --   --  2.0  --   --   --  2.5*  --  2.2  PHOS  --   --   --  4.5 3.6  --   --   --  3.4 3.7  --   --   --   --   --   --    < > = values in this interval not displayed.   GFR: Estimated Creatinine Clearance: 80.5 mL/min (by C-G formula based on SCr of 1.18 mg/dL). Recent Labs  Lab 07/12/2019 0325 07/04/2019 0329 07/16/2019 0705 07/30/19 0437 07/30/19 1200 07/05/2019 0315 08/01/19 0322 08/02/19 0421  WBC   < >  --   --  8.6  --  11.4* 10.8* 9.6  LATICACIDVEN  --  1.5 2.2*  --  1.9  --   --   --    < > = values in this interval not displayed.    Liver Function Tests: Recent Labs  Lab 07/27/19 0400 08/01/19 0322  AST 67* 60*  ALT 104* 71*  ALKPHOS 117 157*  BILITOT 1.0 1.0  PROT 6.0* 6.0*  ALBUMIN 2.8* 2.2*   No results for input(s): LIPASE, AMYLASE in the last 168 hours. No results for input(s): AMMONIA in the last 168 hours.  ABG    Component Value Date/Time   PHART 7.359 08/01/2019 0319   PCO2ART 38.1 08/01/2019 0319   PO2ART 82 (L) 08/01/2019 0319   HCO3 21.4 08/01/2019 0319   TCO2 22 08/01/2019 0319   ACIDBASEDEF 4.0 (H) 08/01/2019 0319   O2SAT 69.6 08/02/2019 0459     Coagulation Profile: Recent Labs  Lab 07/30/19 0437 07/05/2019 0315 07/10/2019 1233 08/01/19 0322 08/02/19 0421  INR 3.5* 2.2* 2.0* 1.5* 1.5*    Cardiac Enzymes: No results for input(s): CKTOTAL, CKMB, CKMBINDEX,  TROPONINI in the last 168 hours.  HbA1C: Hgb A1c MFr Bld  Date/Time Value Ref Range Status  06/22/2019 11:59 AM 5.4 4.8 - 5.6 % Final    Comment:    (NOTE)         Prediabetes: 5.7 - 6.4         Diabetes: >6.4         Glycemic control for adults with diabetes: <7.0     CBG: Recent Labs  Lab 08/01/19 1135 08/01/19 1556 08/01/19 2045 08/02/19 0003 08/02/19 0435  GLUCAP 147* 133* 139* 113* 96      Critical care  time: 88 minutes     Noe Gens, MSN, NP-C  Pulmonary & Critical Care 08/02/2019, 7:18 AM   Please see [email or web address] for pager details.

## 2019-08-02 NOTE — Progress Notes (Signed)
Patient ID: Aaron [name], male   DOB: [birth year], 54 y.o.   MRN: [id number]   Advanced Heart Failure VAD Team Note  PCP-Cardiologist: No primary care provider on file.   Subjective:    - Had refractory VF throughout the night 7/28 w/ ~10 shocks. Now intubated.  - Developed bradycardia and further VF 7/30 am, TTVP + IABP placed 7/30 am.    No further VF yesterday or overnight.  He is in NSR in 60s, not pacing.  Amiodarone at 60 mg/hr, lidocaine at 1.  Lidocaine level pending.   IABP 1:1, MAP 70s.  Co-ox 70%, CVP 14-15.  I/Os positive. Despite IV Lasix, creatinine 1.18.   ?Component of septic shock, remains on cefepime.  Tm 100.4 yesterday pm, currently afebrile.  WBCs 9.6.  No pressors.   LVAD INTERROGATION:  HeartMate 3 LVAD:   Flow 4.2  liters/min, speed 5200, power 3.6 PI 4.7. 15-20 PI events.   Objective:    Vital Signs:   Temp:  [99 F (37.2 C)-100.4 F (38 C)] 99.3 F (37.4 C) (08/01 0800) Pulse Rate:  [59-70] 61 (08/01 0800) Resp:  [12-37] 34 (08/01 0800) BP: (85-100)/(49-67) 100/67 (08/01 0000) SpO2:  [88 %-97 %] 95 % (08/01 0800) Arterial Line BP: (82-122)/(39-62) 115/50 (08/01 0800) FiO2 (%):  [40 %-50 %] 40 % (08/01 0800) Weight:  [95.1 kg] 95.1 kg (08/01 0615) Last BM Date: 07/27/19 Mean arterial Pressure 70s  Intake/Output:   Intake/Output Summary (Last 24 hours) at 08/02/2019 0811 Last data filed at 08/02/2019 0759 Gross per 24 hour  Intake 4483.56 ml  Output 2055 ml  Net 2428.56 ml     Physical Exam    CVP 14-15 General: Sedated on vent HEENT: Normal. Neck: Supple, JVP 14 cm. Carotids OK.  Cardiac:  Mechanical heart sounds with LVAD hum present.  Lungs:  CTAB, normal effort.  Abdomen:  NT, ND, no HSM. No bruits or masses. +BS  LVAD exit site: Well-healed and incorporated. Dressing dry and intact. No erythema or drainage. Stabilization device present and accurately applied. Driveline dressing changed daily per sterile technique. Extremities:  Warm  and dry. No cyanosis, clubbing, rash, or edema.  Neuro:  He will wake up and follow commands   Telemetry   NSR 60s with 1st degree AVB (personally reviewed).   Labs   Basic Metabolic Panel: Recent Labs  Lab 07/12/2019 0325 07/02/2019 0325 07/07/2019 0409 07/19/2019 1742 07/30/19 0437 07/30/19 1047 07/30/19 1235 07/30/19 1235 07/30/19 1730 07/09/2019 0315 07/10/2019 0315 07/12/2019 0322 07/25/2019 1706 08/01/19 0319 08/01/19 0322 08/01/19 1440 08/02/19 0421  NA 125*   < >   < >  --  127*   < > 124*   < >  --  127*  --    < > 132* 130* 128* 129* 129*  K 4.7   < >   < >  --  3.1*   < > 3.8   < >  --  3.5  --    < > 4.0 4.3 4.2 4.6 4.0  CL 95*   < >  --   --  95*  --  95*  --   --  97*  --   --   --   --  100 100 100  CO2 18*   < >  --   --  21*  --  19*  --   --  19*  --   --   --   --  19* 20* 19*  GLUCOSE 282*   < >  --   --  138*  --  125*  --   --  138*  --   --   --   --  131* 134* 136*  BUN 13   < >  --   --  21*  --  24*  --   --  28*  --   --   --   --  26* 32* 42*  CREATININE 0.95   < >  --   --  0.95  --  1.01  --   --  1.20  --   --   --   --  0.93 1.05 1.18  CALCIUM 7.4*   < >  --   --  8.3*  --  7.9*   < >  --  8.0*   < >  --   --   --  8.1* 7.9* 7.9*  MG 2.4  --   --   --  1.9  --   --   --   --  2.0  --   --   --   --  2.5*  --  2.2  PHOS  --   --   --  4.5 3.6  --   --   --  3.4 3.7  --   --   --   --   --   --   --    < > = values in this interval not displayed.    Liver Function Tests: Recent Labs  Lab 07/27/19 0400 08/01/19 0322  AST 67* 60*  ALT 104* 71*  ALKPHOS 117 157*  BILITOT 1.0 1.0  PROT 6.0* 6.0*  ALBUMIN 2.8* 2.2*   No results for input(s): LIPASE, AMYLASE in the last 168 hours. No results for input(s): AMMONIA in the last 168 hours.  CBC: Recent Labs  Lab 07/27/19 0400 07/27/19 0400 07/17/2019 0325 07/21/2019 0409 07/30/19 0437 07/30/19 1047 07/14/2019 0315 07/13/2019 0322 07/22/2019 0935 07/11/2019 1706 08/01/19 0319 08/01/19 0322 08/02/19 0421   WBC 8.6   < > 14.1*  --  8.6  --  11.4*  --   --   --   --  10.8* 9.6  NEUTROABS 6.3  --   --   --   --   --   --   --   --   --   --   --   --   HGB 10.7*   < > 9.7*   < > 10.0*   < > 10.0*   < > 11.2* 10.5* 9.9* 9.4* 8.7*  HCT 34.4*   < > 29.8*   < > 31.4*   < > 32.2*   < > 33.0* 31.0* 29.0* 30.5* 28.3*  MCV 95.6   < > 94.6  --  92.6  --  92.0  --   --   --   --  93.6 95.0  PLT 215   < > 160  --  159  --  176  --   --   --   --  153 152   < > = values in this interval not displayed.    INR: Recent Labs  Lab 07/30/19 0437 07/25/2019 0315 07/18/2019 1233 08/01/19 0322 08/02/19 0421  INR 3.5* 2.2* 2.0* 1.5* 1.5*    Other results:    Imaging   CARDIAC CATHETERIZATION  Result Date: 07/11/2019 Successful TVP and IABP placement. Glori Bickers, MD 11:30 AM  DG CHEST PORT 1 VIEW  Result Date: 08/01/2019 CLINICAL DATA:  54 year old male with CHF and refractory ventricular fibrillation status post resuscitation EXAM: PORTABLE CHEST 1 VIEW COMPARISON:  Prior chest x-ray 07/02/2019 FINDINGS: Patient remains intubated. The tip of the endotracheal tube is 3 cm above the carina. External defibrillator pads project over the chest. Patient has a left ventricular assist device as well in unchanged position. A gastric tube is present. The proximal side hole can be seen overlying the visualized portion of the gastric bubble. A right upper extremity approach PICC is also present. The tip is at the cavoatrial junction. Patient is status post median sternotomy. Stable cardiomegaly. Mild pulmonary edema. Bibasilar airspace opacities are unchanged. No pneumothorax or large effusion. New transvenous pacing lead overlying the right atrium. This comes via a femoral approach. IMPRESSION: New femoral approach transvenous cardiac pacing lead projects over the right ventricle. Otherwise, stable chest x-ray without significant interval change over the last 24 hours. Electronically Signed   By: Jacqulynn Cadet  M.D.   On: 08/01/2019 12:51     Medications:     Scheduled Medications: . aspirin  81 mg Per Tube Daily  . atorvastatin  80 mg Per Tube Daily  . chlorhexidine gluconate (MEDLINE KIT)  15 mL Mouth Rinse BID  . Chlorhexidine Gluconate Cloth  6 each Topical Daily  . clonazePAM  0.5 mg Per Tube BID  . digoxin  0.125 mg Per Tube Daily  . docusate  100 mg Per Tube BID  . feeding supplement (PROSource TF)  90 mL Per Tube TID  . folic acid  1 mg Per Tube Daily  . free water  20 mL Per Tube Q4H  . gabapentin  300 mg Per Tube Q8H  . mouth rinse  15 mL Mouth Rinse 10 times per day  . multivitamin  15 mL Per Tube Daily  . pantoprazole sodium  40 mg Per Tube Daily  . polyethylene glycol  17 g Oral Daily  . sildenafil  20 mg Per Tube TID  . sodium chloride flush  10-40 mL Intracatheter Q12H  . thiamine  100 mg Per Tube Daily    Infusions: . sodium chloride    . sodium chloride 10 mL/hr at 08/02/19 0759  . sodium chloride 20 mL/hr at 08/01/19 1700  . amiodarone 60 mg/hr (08/02/19 0759)  . feeding supplement (VITAL AF 1.2 CAL) 1,000 mL (08/02/19 0726)  . fentaNYL infusion INTRAVENOUS 250 mcg/hr (08/02/19 0759)  . heparin 500 Units/hr (08/02/19 0759)  . lidocaine 1 mg/min (08/02/19 0759)  . midazolam 2 mg/hr (08/02/19 0759)  . phenylephrine (NEO-SYNEPHRINE) Adult infusion 10 mcg/min (08/02/19 0759)  . vasopressin Stopped (07/18/2019 0051)    PRN Medications: Place/Maintain arterial line **AND** sodium chloride, sodium chloride, acetaminophen, fentaNYL, midazolam, sodium chloride flush   Assessment/Plan:    1. Refractory Ventricular Fibrillation  -multiple episodes ~10 of VF on 7/28 - multiple episodes 7/30 am => IABP placed as well as TTVP given relative bradycardia.  Continued to have VF 7/30 afternoon and also 7/31 early am.  - Ranexa and Mexiletine currently on hold (unable to crush and administer through tube). Plan to resume Ranexa 500 mg bid + Mexiletine 200 mg bid once  extubated  - Continue lidocaine gtt at 1, level today pending.  - Continue amiodarone gtt at 60 mg/hr.   - Sotalol not a good option given his history of bradycardia.  - He does better in NSR in 60s than pacing at higher rate.  If rate remains stable, can remove TTVP tomorrow.  - Keep K > 4.0 and Mg > 2.0. - Bedside echo 7/28 cannula ok.  MAP improved post-IABP, wonder if we may actually be able to increase LVAD speed eventually to improve output and decrease drive for VT.  For now, will continue IABP with no LVAD speed changes.  - Rest today with IABP in place and sedation, will see if we can start weaning IABP tomorrow.    2. Acute Systolic Heart Failure s/p VAD - EF 58%, RV systolic function mildly reduced - HMIII LVAD placed 06/25/19.  - RV looks good on echo, cannula ok  - Co-ox 70% - avoid inotropes with VF - Heparin gtt with IABP, off warfarin.  - Volume overloaded with I>>O despite IV Lasix yesterday, will give Lasix 80 mg IV bid with a dose of metolazone, replace K.  - As above, rest today with sedation and will see if we can wean IABP tomorrow.  - As above, with improved MAP on IABP, wonder if we will be able to increase speed carefully in the future.   3. Acute hypoxic resp failure - intubated in setting of VF - vent management per PCCM  - Diuresis.  - Keep sedated today (he wakes up and follows commands).   4. CAD - S/p Acute STEMI 6/21.Emergent cath showed severe multivessel disease w/ 75% mid- distal LM, 100% prox LAD, not amendable to PCI (unable to cross w/ guide wire, probable CTO), 100% prox LCx (probable CTO) and 100% ostial RCA (probable CTO). There were mildto moderate collaterals right to left to the LAD, minimal right to right collateralsan minimalleft to left collaterals.LVEF 25% -Hs trop peaked 27,000. - No targets for CABG / PCI.  - S/P HMIII VAD - Continue ASA + statin   5. Renal mass = RCC - MRI abdomen- 1.8 cm right renal lesion,  most consistent with renal cell carcinoma. No evidence of renal vein involvement or nodal metastasis. - Discussed w/ Urology and R RCC is small and will just require surveillance. Ok to proceed with VAD from their standpoint.   6. Urinary retention - Foley placed - UA + for UTI. On abx  7. ID:  - ?HCAP => Klebsiella in trach aspirate from 7/29.  - Cefepime ongoing, febrile to 100.4 yesterday afternoon currently afebrile.   I reviewed the LVAD parameters from today, and compared the results to the patient's prior recorded data.  No programming changes were made.  The LVAD is functioning within specified parameters.  The patient performs LVAD self-test daily.  LVAD interrogation was negative for any significant power changes, alarms or PI events/speed drops.  LVAD equipment check completed and is in good working order.  Back-up equipment present.   LVAD education done on emergency procedures and precautions and reviewed exit site care.   CRITICAL CARE Performed by: Loralie Champagne  Total critical care time: 45 minutes  Critical care time was exclusive of separately billable procedures and treating other patients.  Critical care was necessary to treat or prevent imminent or life-threatening deterioration.  Critical care was time spent personally by me (independent of midlevel providers or residents) on the following activities: development of treatment plan with patient and/or surrogate as well as nursing, discussions with consultants, evaluation of patient's response to treatment, examination of patient, obtaining history from patient or surrogate, ordering and performing treatments and interventions, ordering and review of laboratory studies, ordering and review of radiographic studies, pulse oximetry and re-evaluation of patient's  condition.   Loralie Champagne, MD  8:11 AM  08/02/2019

## 2019-08-02 NOTE — Progress Notes (Signed)
[facility] for Heparin Indication: LVAD>IABP  Allergies  Allergen Reactions  . Codeine Hives    Patient Measurements: Height: 5\' 9"  (175.3 cm) Weight: (!) 95.1 kg (209 lb 10.5 oz) IBW/kg (Calculated) : 70.7 Heparin Dosing Weight: ~ 92 kg  Vital Signs: Temp: 99.3 F (37.4 C) (08/01 0800) Temp Source: Bladder (08/01 0800) BP: 100/67 (08/01 0000) Pulse Rate: 61 (08/01 0800)  Labs: Recent Labs    07/24/2019 0315 07/25/2019 0322 07/10/2019 1233 07/11/2019 1706 08/01/19 0319 08/01/19 0319 08/01/19 0322 08/01/19 1440 08/02/19 0421  HGB 10.0*   < >  --    < > 9.9*   < > 9.4*  --  8.7*  HCT 32.2*   < >  --    < > 29.0*  --  30.5*  --  28.3*  PLT 176  --   --   --   --   --  153  --  152  LABPROT 23.5*   < > 21.8*  --   --   --  17.6*  --  17.5*  INR 2.2*   < > 2.0*  --   --   --  1.5*  --  1.5*  HEPARINUNFRC  --   --   --   --   --   --  <0.10*  --  <0.10*  CREATININE 1.20   < >  --   --   --   --  0.93 1.05 1.18   < > = values in this interval not displayed.    Estimated Creatinine Clearance: 80.5 mL/min (by C-G formula based on SCr of 1.18 mg/dL).  Assessment: 54 yo male s/p LVAD HM3 placement 6/24, followed by IABP 7/8-7/13.    INR now down and stable 1.5 this morning after IABP emergently placed due to recurrent arrhythmias 7/30. No bleeding or IV issues noted, has been on low dose d/t therapeutic INR. With INR down will advance heparin dosing to conventional IABP dosing. Hgb is trending down from 10s to 8.7 this am. LDH stable 297.   Klebsiella pneumonia growing from trach aspirate, fairly sensitive. Stil somewhat febrile overnight to 100.4, wbc normal at 9.6. Blood cultures negative. Antibiotics changed from cefepime to cefazolin.   Goal of Therapy:  Heparin level goal 0.2-0.5 INR 2-2.5  Monitor platelets by anticoagulation protocol: Yes   Plan:  Increase IV heparin to 900 units/hr Follow INR  Check heparin level and cbc this  afternoon  Cefazolin 2g q8 hours - 7 day total antibiotic stop date of 8/4 entered, do not expect dose changes will be needed  Erin Hearing PharmD., BCPS Clinical Pharmacist 08/02/2019 8:14 AM

## 2019-08-02 NOTE — Progress Notes (Addendum)
Progress Note  Patient Name: Aaron [name] Date of Encounter: 08/02/2019  Primary Cardiologist: No primary care provider on file.   Subjective   Remains intubated. No more VF in NSR  Inpatient Medications    Scheduled Meds: . aspirin  81 mg Per Tube Daily  . atorvastatin  80 mg Per Tube Daily  . chlorhexidine gluconate (MEDLINE KIT)  15 mL Mouth Rinse BID  . Chlorhexidine Gluconate Cloth  6 each Topical Daily  . clonazePAM  0.5 mg Per Tube BID  . digoxin  0.125 mg Per Tube Daily  . docusate  100 mg Per Tube BID  . feeding supplement (PROSource TF)  90 mL Per Tube TID  . folic acid  1 mg Per Tube Daily  . free water  20 mL Per Tube Q4H  . furosemide  80 mg Intravenous BID  . gabapentin  300 mg Per Tube Q8H  . mouth rinse  15 mL Mouth Rinse 10 times per day  . multivitamin  15 mL Per Tube Daily  . pantoprazole sodium  40 mg Per Tube Daily  . polyethylene glycol  17 g Oral Daily  . sildenafil  20 mg Per Tube TID  . sodium chloride flush  10-40 mL Intracatheter Q12H  . thiamine  100 mg Per Tube Daily   Continuous Infusions: . sodium chloride    . sodium chloride 10 mL/hr at 08/02/19 0759  . sodium chloride 20 mL/hr at 08/01/19 1700  . amiodarone 60 mg/hr (08/02/19 0759)  .  ceFAZolin (ANCEF) IV    . feeding supplement (VITAL AF 1.2 CAL) 1,000 mL (08/02/19 0726)  . fentaNYL infusion INTRAVENOUS 250 mcg/hr (08/02/19 0759)  . heparin 900 Units/hr (08/02/19 0826)  . lidocaine 1 mg/min (08/02/19 0759)  . midazolam 2 mg/hr (08/02/19 0759)  . phenylephrine (NEO-SYNEPHRINE) Adult infusion 10 mcg/min (08/02/19 0759)  . potassium chloride 10 mEq (08/02/19 0821)  . vasopressin Stopped (07/03/2019 0051)   PRN Meds: Place/Maintain arterial line **AND** sodium chloride, sodium chloride, acetaminophen, fentaNYL, midazolam, sodium chloride flush   Vital Signs    Vitals:   08/02/19 0800 08/02/19 0815 08/02/19 0830 08/02/19 0845  BP:      Pulse: 61 60 61 62  Resp: (!) 34 (!) 34  23 23  Temp: 99.3 F (37.4 C) 99.3 F (37.4 C) 99.3 F (37.4 C) 99.1 F (37.3 C)  TempSrc: Bladder     SpO2: 95% 96% (!) 68% 93%  Weight:      Height:        Intake/Output Summary (Last 24 hours) at 08/02/2019 0853 Last data filed at 08/02/2019 0825 Gross per 24 hour  Intake 4528.56 ml  Output 2055 ml  Net 2473.56 ml   Filed Weights   07/13/2019 0500 08/01/19 0700 08/02/19 0615  Weight: 91.1 kg (!) 94.8 kg (!) 95.1 kg    Telemetry    nsr with first degree AV block - Personally Reviewed  ECG    none - Personally Reviewed  Physical Exam   GEN: intubated and sedated  Neck: 12 cm JVD Cardiac: RRR, no murmurs, rubs, or gallops.  Respiratory: basilar rales GI: Soft, nontender, non-distended  MS: No edema; No deformity. Neuro:  intubated Psych: intubated  Labs    Chemistry Recent Labs  Lab 07/27/19 0400 07/20/2019 0405 08/01/19 0322 08/01/19 1440 08/02/19 0421  NA 133*   < > 128* 129* 129*  K 4.1   < > 4.2 4.6 4.0  CL 101   < >  100 100 100  CO2 24   < > 19* 20* 19*  GLUCOSE 99   < > 131* 134* 136*  BUN 13   < > 26* 32* 42*  CREATININE 0.97   < > 0.93 1.05 1.18  CALCIUM 8.5*   < > 8.1* 7.9* 7.9*  PROT 6.0*  --  6.0*  --   --   ALBUMIN 2.8*  --  2.2*  --   --   AST 67*  --  60*  --   --   ALT 104*  --  71*  --   --   ALKPHOS 117  --  157*  --   --   BILITOT 1.0  --  1.0  --   --   GFRNONAA >60   < > >60 >60 >60  GFRAA >60   < > >60 >60 >60  ANIONGAP 8   < > _0 < > = values in this interval not displayed.     Hematology Recent Labs  Lab 07/17/2019 0315 07/10/2019 0322 08/01/19 0319 08/01/19 0322 08/02/19 0421  WBC 11.4*  --   --  10.8* 9.6  RBC 3.50*  --   --  3.26* 2.98*  HGB 10.0*   < > 9.9* 9.4* 8.7*  HCT 32.2*   < > 29.0* 30.5* 28.3*  MCV 92.0  --   --  93.6 95.0  MCH 28.6  --   --  28.8 29.2  MCHC 31.1  --   --  30.8 30.7  RDW 16.8*  --   --  16.8* 17.1*  PLT 176  --   --  153 152   < > = values in this interval not displayed.     Cardiac EnzymesNo results for input(s): TROPONINI in the last 168 hours. No results for input(s): TROPIPOC in the last 168 hours.   BNPNo results for input(s): BNP, PROBNP in the last 168 hours.   DDimer No results for input(s): DDIMER in the last 168 hours.   Radiology    CARDIAC CATHETERIZATION  Result Date: 07/04/2019 Successful TVP and IABP placement. Glori Bickers, MD 11:30 AM  DG CHEST PORT 1 VIEW  Result Date: 08/01/2019 CLINICAL DATA:  54 year old male with CHF and refractory ventricular fibrillation status post resuscitation EXAM: PORTABLE CHEST 1 VIEW COMPARISON:  Prior chest x-ray 07/17/2019 FINDINGS: Patient remains intubated. The tip of the endotracheal tube is 3 cm above the carina. External defibrillator pads project over the chest. Patient has a left ventricular assist device as well in unchanged position. A gastric tube is present. The proximal side hole can be seen overlying the visualized portion of the gastric bubble. A right upper extremity approach PICC is also present. The tip is at the cavoatrial junction. Patient is status post median sternotomy. Stable cardiomegaly. Mild pulmonary edema. Bibasilar airspace opacities are unchanged. No pneumothorax or large effusion. New transvenous pacing lead overlying the right atrium. This comes via a femoral approach. IMPRESSION: New femoral approach transvenous cardiac pacing lead projects over the right ventricle. Otherwise, stable chest x-ray without significant interval change over the last 24 hours. Electronically Signed   By: Jacqulynn Cadet M.D.   On: 08/01/2019 12:51    Cardiac Studies   none  Patient Profile     54 y.o. male admitted with VF storm, ICM, s/p LVAD, now s/p IABP and temp PM wire  Assessment & Plan    1. VF - he has been quiet the  last 24 hours on IV amio and after stopping RV pacing. Continue IV amio and low dose lidocaine. Watch for Lido toxicity.  2. Bradycardia - this appears to be  resolved. Consider removal of temp wire.  3. Acute on chronic systolic heart failure - improved today with NSR instead of RV pacing. Co-ox in the 70 range.   For questions or updates, please contact [place] Please consult [email or web address] for contact info under Cardiology/STEMI.      Signed, Cristopher Peru, MD  08/02/2019, 8:53 AM  Patient ID: Chester Holstein, male   DOB: [birth year], 54 y.o.   MRN: [id number]

## 2019-08-02 NOTE — Progress Notes (Signed)
[place] for Heparin Indication: LVAD>IABP  Allergies  Allergen Reactions  . Codeine Hives    Patient Measurements: Height: 5\' 9"  (175.3 cm) Weight: (!) 95.1 kg (209 lb 10.5 oz) IBW/kg (Calculated) : 70.7 Heparin Dosing Weight: ~ 92 kg  Vital Signs: Temp: 99.5 F (37.5 C) (08/01 1630) Temp Source: Bladder (08/01 1600) BP: 86/70 (08/01 1200) Pulse Rate: 61 (08/01 1630)  Labs: Recent Labs    08/01/2019 1233 07/08/2019 1706 08/01/19 0322 08/01/19 0322 08/01/19 1440 08/02/19 0421 08/02/19 1546  HGB  --    < > 9.4*   < >  --  8.7* 8.8*  HCT  --    < > 30.5*  --   --  28.3* 27.9*  PLT  --   --  153  --   --  152 152  LABPROT 21.8*  --  17.6*  --   --  17.5*  --   INR 2.0*  --  1.5*  --   --  1.5*  --   HEPARINUNFRC  --   --  <0.10*  --   --  <0.10* <0.10*  CREATININE  --   --  0.93   < > 1.05 1.18 1.36*   < > = values in this interval not displayed.    Estimated Creatinine Clearance: 69.9 mL/min (A) (by C-G formula based on SCr of 1.36 mg/dL (H)).  Assessment: 54 yo male s/p LVAD HM3 placement 6/24, followed by IABP 7/8-7/13. 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  Heparin level undetectable, no S/Sx bleeding per RN, CBC stable this am.  Goal of Therapy:  Heparin level goal 0.2-0.5 INR 2-2.5  Monitor platelets by anticoagulation protocol: Yes   Plan:  Increase IV heparin to 1050 units/h Recheck heparin level in [place], PharmD, BCPS Clinical Pharmacist [phone] Please check AMION for all [place] numbers 08/02/2019

## 2019-08-02 DEATH — deceased

## 2019-08-03 ENCOUNTER — Inpatient Hospital Stay (HOSPITAL_COMMUNITY): Payer: BC Managed Care – PPO

## 2019-08-03 LAB — CULTURE, BLOOD (ROUTINE X 2)
Culture: NO GROWTH
Culture: NO GROWTH
Special Requests: ADEQUATE
Special Requests: ADEQUATE

## 2019-08-03 LAB — GLUCOSE, CAPILLARY
Glucose-Capillary: 115 mg/dL — ABNORMAL HIGH (ref 70–99)
Glucose-Capillary: 118 mg/dL — ABNORMAL HIGH (ref 70–99)
Glucose-Capillary: 120 mg/dL — ABNORMAL HIGH (ref 70–99)
Glucose-Capillary: 121 mg/dL — ABNORMAL HIGH (ref 70–99)
Glucose-Capillary: 128 mg/dL — ABNORMAL HIGH (ref 70–99)
Glucose-Capillary: 137 mg/dL — ABNORMAL HIGH (ref 70–99)
Glucose-Capillary: 158 mg/dL — ABNORMAL HIGH (ref 70–99)
Glucose-Capillary: 175 mg/dL — ABNORMAL HIGH (ref 70–99)

## 2019-08-03 LAB — CBC
HCT: 27.5 % — ABNORMAL LOW (ref 39.0–52.0)
Hemoglobin: 8.4 g/dL — ABNORMAL LOW (ref 13.0–17.0)
MCH: 28.2 pg (ref 26.0–34.0)
MCHC: 30.5 g/dL (ref 30.0–36.0)
MCV: 92.3 fL (ref 80.0–100.0)
Platelets: 159 10*3/uL (ref 150–400)
RBC: 2.98 MIL/uL — ABNORMAL LOW (ref 4.22–5.81)
RDW: 17.3 % — ABNORMAL HIGH (ref 11.5–15.5)
WBC: 9 10*3/uL (ref 4.0–10.5)
nRBC: 0.3 % — ABNORMAL HIGH (ref 0.0–0.2)

## 2019-08-03 LAB — BASIC METABOLIC PANEL
Anion gap: 11 (ref 5–15)
Anion gap: 12 (ref 5–15)
BUN: 56 mg/dL — ABNORMAL HIGH (ref 6–20)
BUN: 65 mg/dL — ABNORMAL HIGH (ref 6–20)
CO2: 21 mmol/L — ABNORMAL LOW (ref 22–32)
CO2: 20 mmol/L — ABNORMAL LOW (ref 22–32)
Calcium: 8 mg/dL — ABNORMAL LOW (ref 8.9–10.3)
Calcium: 7.9 mg/dL — ABNORMAL LOW (ref 8.9–10.3)
Chloride: 100 mmol/L (ref 98–111)
Chloride: 100 mmol/L (ref 98–111)
Creatinine, Ser: 1.47 mg/dL — ABNORMAL HIGH (ref 0.61–1.24)
Creatinine, Ser: 1.55 mg/dL — ABNORMAL HIGH (ref 0.61–1.24)
GFR calc Af Amer: >60 mL/min (ref >60)
GFR calc Af Amer: 58 mL/min — ABNORMAL LOW (ref >60)
GFR calc non Af Amer: 53 mL/min — ABNORMAL LOW (ref >60)
GFR calc non Af Amer: 50 mL/min — ABNORMAL LOW (ref >60)
Glucose, Bld: 119 mg/dL — ABNORMAL HIGH (ref 70–99)
Glucose, Bld: 130 mg/dL — ABNORMAL HIGH (ref 70–99)
Potassium: 3.2 mmol/L — ABNORMAL LOW (ref 3.5–5.1)
Potassium: 3.6 mmol/L (ref 3.5–5.1)
Sodium: 132 mmol/L — ABNORMAL LOW (ref 135–145)
Sodium: 132 mmol/L — ABNORMAL LOW (ref 135–145)

## 2019-08-03 LAB — POCT I-STAT 7, (LYTES, BLD GAS, ICA,H+H)
Acid-base deficit: 3 mmol/L — ABNORMAL HIGH (ref 0.0–2.0)
Bicarbonate: 22.4 mmol/L (ref 20.0–28.0)
Calcium, Ion: 1.17 mmol/L (ref 1.15–1.40)
HCT: 28 % — ABNORMAL LOW (ref 39.0–52.0)
Hemoglobin: 9.5 g/dL — ABNORMAL LOW (ref 13.0–17.0)
O2 Saturation: 97 %
Patient temperature: 37.1
Potassium: 3.4 mmol/L — ABNORMAL LOW (ref 3.5–5.1)
Sodium: 133 mmol/L — ABNORMAL LOW (ref 135–145)
TCO2: 24 mmol/L (ref 22–32)
pCO2 arterial: 38.6 mmHg (ref 32.0–48.0)
pH, Arterial: 7.372 (ref 7.350–7.450)
pO2, Arterial: 99 mmHg (ref 83.0–108.0)

## 2019-08-03 LAB — COOXEMETRY PANEL
Carboxyhemoglobin: 1.8 % — ABNORMAL HIGH (ref 0.5–1.5)
Carboxyhemoglobin: 1.7 % — ABNORMAL HIGH (ref 0.5–1.5)
Methemoglobin: 1.1 % (ref 0.0–1.5)
Methemoglobin: 1.2 % (ref 0.0–1.5)
O2 Saturation: 85.9 %
O2 Saturation: 61.6 %
Total hemoglobin: 7.1 g/dL — ABNORMAL LOW (ref 12.0–16.0)
Total hemoglobin: 9.4 g/dL — ABNORMAL LOW (ref 12.0–16.0)

## 2019-08-03 LAB — LIDOCAINE LEVEL: Lidocaine Lvl: 3 ug/mL (ref 1.5–5.0)

## 2019-08-03 LAB — HEPARIN LEVEL (UNFRACTIONATED)
Heparin Unfractionated: 0.11 IU/mL — ABNORMAL LOW (ref 0.30–0.70)
Heparin Unfractionated: 0.27 IU/mL — ABNORMAL LOW (ref 0.30–0.70)

## 2019-08-03 LAB — PROTIME-INR
INR: 1.3 — ABNORMAL HIGH (ref 0.8–1.2)
Prothrombin Time: 16.1 seconds — ABNORMAL HIGH (ref 11.4–15.2)

## 2019-08-03 LAB — LACTATE DEHYDROGENASE: LDH: 309 U/L — ABNORMAL HIGH (ref 98–192)

## 2019-08-03 LAB — MAGNESIUM: Magnesium: 2.1 mg/dL (ref 1.7–2.4)

## 2019-08-03 MED ORDER — PHENYLEPHRINE HCL-NACL 20-0.9 MG/250ML-% IV SOLN
0.0000 ug/min | INTRAVENOUS | Status: DC
  Administered 2019-08-03: 10 ug/min via INTRAVENOUS
  Administered 2019-08-05 – 2019-08-06 (×2): 20 ug/min via INTRAVENOUS
  Administered 2019-08-07: 30 ug/min via INTRAVENOUS
  Administered 2019-08-08: 25 ug/min via INTRAVENOUS
  Administered 2019-08-10: 45 ug/min via INTRAVENOUS
  Administered 2019-08-10: 20 ug/min via INTRAVENOUS
  Administered 2019-08-11 (×2): 30 ug/min via INTRAVENOUS
  Administered 2019-08-12: 20 ug/min via INTRAVENOUS
  Administered 2019-08-13: 70 ug/min via INTRAVENOUS
  Administered 2019-08-13: 20 ug/min via INTRAVENOUS
  Administered 2019-08-13: 40 ug/min via INTRAVENOUS
  Administered 2019-08-14: 20 ug/min via INTRAVENOUS
  Administered 2019-08-14: 40 ug/min via INTRAVENOUS
  Administered 2019-08-15: 45 ug/min via INTRAVENOUS
  Administered 2019-08-15: 20 ug/min via INTRAVENOUS
  Administered 2019-08-16 (×2): 35 ug/min via INTRAVENOUS
  Administered 2019-08-16: 32 ug/min via INTRAVENOUS
  Administered 2019-08-18: 10 ug/min via INTRAVENOUS
  Administered 2019-08-18: 50 ug/min via INTRAVENOUS
  Administered 2019-08-19: 20 ug/min via INTRAVENOUS
  Filled 2019-08-03 (×23): qty 250

## 2019-08-03 MED ORDER — ALTEPLASE 2 MG IJ SOLR
2.0000 mg | Freq: Once | INTRAMUSCULAR | Status: AC
  Administered 2019-08-03: 2 mg
  Filled 2019-08-03: qty 2

## 2019-08-03 MED ORDER — POTASSIUM CHLORIDE CRYS ER 20 MEQ PO TBCR: 40.0000 meq | EXTENDED_RELEASE_TABLET | Freq: Once | ORAL | Status: DC

## 2019-08-03 MED ORDER — SORBITOL 70 % SOLN
30.0000 mL | Freq: Once | Status: AC
  Administered 2019-08-03: 30 mL
  Filled 2019-08-03: qty 30

## 2019-08-03 MED ORDER — POTASSIUM CHLORIDE 20 MEQ/15ML (10%) PO SOLN
40.0000 meq | Freq: Two times a day (BID) | ORAL | Status: DC
  Administered 2019-08-03: 40 meq via ORAL
  Filled 2019-08-03: qty 30

## 2019-08-03 MED ORDER — POTASSIUM CHLORIDE 20 MEQ/15ML (10%) PO SOLN
40.0000 meq | Freq: Two times a day (BID) | ORAL | Status: DC
  Administered 2019-08-03 – 2019-08-04 (×3): 40 meq via ORAL
  Filled 2019-08-03 (×3): qty 30

## 2019-08-03 MED ORDER — POTASSIUM CHLORIDE 10 MEQ/50ML IV SOLN
10.0000 meq | INTRAVENOUS | Status: AC
  Administered 2019-08-03 (×4): 10 meq via INTRAVENOUS
  Filled 2019-08-03 (×4): qty 50

## 2019-08-03 MED ORDER — ALTEPLASE 2 MG IJ SOLR
2.0000 mg | Freq: Once | INTRAMUSCULAR | Status: DC
  Filled 2019-08-03: qty 2

## 2019-08-03 MED ORDER — POTASSIUM CHLORIDE 20 MEQ/15ML (10%) PO SOLN
40.0000 meq | Freq: Once | ORAL | Status: AC
  Administered 2019-08-03: 40 meq
  Filled 2019-08-03: qty 30

## 2019-08-03 MED FILL — Heparin Sod (Porcine)-NaCl IV Soln 1000 Unit/500ML-0.9%: INTRAVENOUS | Qty: 500 | Status: AC

## 2019-08-03 NOTE — Progress Notes (Addendum)
Progress Note  Patient Name: Aaron [name] Date of Encounter: 08/03/2019  [facility] Cardiologist: Dr. Marlou Porch  Subjective   Intubated and sedated  Inpatient Medications    Scheduled Meds:  aspirin  81 mg Per Tube Daily   atorvastatin  80 mg Per Tube Daily   chlorhexidine gluconate (MEDLINE KIT)  15 mL Mouth Rinse BID   Chlorhexidine Gluconate Cloth  6 each Topical Daily   clonazePAM  0.5 mg Per Tube BID   digoxin  0.125 mg Per Tube Daily   docusate  100 mg Per Tube BID   feeding supplement (PROSource TF)  90 mL Per Tube TID   folic acid  1 mg Per Tube Daily   free water  20 mL Per Tube Q4H   furosemide  80 mg Intravenous BID   gabapentin  300 mg Per Tube Q8H   mouth rinse  15 mL Mouth Rinse 10 times per day   multivitamin  15 mL Per Tube Daily   pantoprazole sodium  40 mg Per Tube Daily   polyethylene glycol  17 g Oral Daily   potassium chloride  40 mEq Oral BID   potassium chloride  40 mEq Oral Once   sildenafil  20 mg Per Tube TID   sodium chloride flush  10-40 mL Intracatheter Q12H   thiamine  100 mg Per Tube Daily   Continuous Infusions:  sodium chloride     sodium chloride Stopped (08/03/19 0633)   sodium chloride 20 mL/hr at 08/01/19 1700   amiodarone 60 mg/hr (08/03/19 0659)    ceFAZolin (ANCEF) IV Stopped (08/03/19 0630)   feeding supplement (VITAL AF 1.2 CAL) 1,000 mL (08/03/19 0504)   fentaNYL infusion INTRAVENOUS 250 mcg/hr (08/03/19 0659)   heparin 1,150 Units/hr (08/03/19 0659)   lidocaine 1 mg/min (08/03/19 0659)   midazolam 2 mg/hr (08/03/19 0659)   phenylephrine (NEO-SYNEPHRINE) Adult infusion Stopped (08/02/19 0805)   potassium chloride 10 mEq (08/03/19 0744)   vasopressin Stopped (07/24/2019 0051)   PRN Meds: Place/Maintain arterial line **AND** sodium chloride, sodium chloride, acetaminophen, fentaNYL, midazolam, sodium chloride flush   Vital Signs    Vitals:   08/03/19 0400 08/03/19 0500 08/03/19 0600 08/03/19 0700  BP: 93/66       Pulse: 56 56 56 55  Resp: _0 Temp: 98.6 F (37 C) 98.6 F (37 C) 98.2 F (36.8 C) 98.2 F (36.8 C)  TempSrc: Bladder     SpO2: 99% 99% 99% 96%  Weight:      Height:        Intake/Output Summary (Last 24 hours) at 08/03/2019 0759 Last data filed at 08/03/2019 0659 Gross per 24 hour  Intake 4293.69 ml  Output 4445 ml  Net -151.31 ml   Last 3 Weights 08/03/2019 08/02/2019 08/01/2019  Weight (lbs) 210 lb 12.2 oz 209 lb 10.5 oz 208 lb 15.9 oz  Weight (kg) 95.6 kg 95.1 kg 94.8 kg      Telemetry    SB 50's 1st degree AVblock - Personally Reviewed  ECG    No new EKGs - Personally Reviewed  Physical Exam   GEN: sedated Neck: No JVD Cardiac: VAD hum.  Respiratory: CTA b/l, intubated GI: Soft, non-distended  MS:  edema; No deformity. Neuro:  unable to assess Psych: unable to assess   Labs    High Sensitivity Troponin:   Recent Labs  Lab 07/09/19 0740 07/09/19 0920  TROPONINIHS 3,337* 3,481*      Chemistry Recent Labs  Lab 08/01/19 0322 08/01/19 1440 08/02/19 0421 08/02/19 1546 08/03/19 0257  NA 128*   < > 129* 129* 132*  K 4.2   < > 4.0 3.5 3.2*  CL 100   < > 100 100 100  CO2 19*   < > 19* 18* 21*  GLUCOSE 131*   < > 136* 143* 119*  BUN 26*   < > 42* 50* 56*  CREATININE 0.93   < > 1.18 1.36* 1.47*  CALCIUM 8.1*   < > 7.9* 7.9* 8.0*  PROT 6.0*  --   --   --   --   ALBUMIN 2.2*  --   --   --   --   AST 60*  --   --   --   --   ALT 71*  --   --   --   --   ALKPHOS 157*  --   --   --   --   BILITOT 1.0  --   --   --   --   GFRNONAA >60   < > >60 58* 53*  GFRAA >60   < > >60 >60 >60  ANIONGAP 9   < > _0 < > = values in this interval not displayed.     Hematology Recent Labs  Lab 08/02/19 0421 08/02/19 1546 08/03/19 0257  WBC 9.6 9.7 9.0  RBC 2.98* 3.00* 2.98*  HGB 8.7* 8.8* 8.4*  HCT 28.3* 27.9* 27.5*  MCV 95.0 93.0 92.3  MCH 29.2 29.3 28.2  MCHC 30.7 31.5 30.5  RDW 17.1* 17.1* 17.3*  PLT 152 152 159    BNPNo results for  input(s): BNP, PROBNP in the last 168 hours.   DDimer No results for input(s): DDIMER in the last 168 hours.   Radiology    Portable Chest xray Result Date: 07/05/2019 CLINICAL DATA:  Hypoxia EXAM: PORTABLE CHEST 1 VIEW COMPARISON:  July 30, 2019. FINDINGS: Endotracheal tube tip is 3.8 cm above the carina. Nasogastric tube tip and side port are below the diaphragm. Central catheter tip is in the superior vena cava near the cavoatrial junction. There is a left ventricular assist device present. No pneumothorax. Small left pleural effusion with left and right base atelectatic change remain. No new opacity evident. Stable cardiomegaly with pulmonary vascularity normal. No adenopathy. Status post median sternotomy. IMPRESSION: Tube and catheter positions as described without pneumothorax. Stable cardiomegaly with left ventricular assist device present. Stable small left pleural effusion with bibasilar atelectasis. No new opacity evident. Electronically Signed   By: Lowella Grip III M.D.   On: 07/14/2019 07:03     Cardiac Studies     07/09/2019: TTE IMPRESSIONS   1. Left ventricular ejection fraction, by estimation, is <20%. The left  ventricle has severely decreased function. The left ventricle demonstrates  global hypokinesis. The left ventricular internal cavity size was mildly  dilated. There is mild left  ventricular hypertrophy.   2. Right ventricular systolic function is mildly reduced. The right  ventricular size is normal.   3. The mitral valve is normal in structure. Trivial mitral valve  regurgitation. No evidence of mitral stenosis.   4. The aortic valve is poorly visualized but does not appear to open.   5. Ramp echo done with speed increased from 5000 rpm to 5100 rpm.     06/17/2019: R/LHC Mid LM lesion is 75% stenosed. Ost LAD lesion is 100% stenosed. Ost Cx to Prox Cx lesion is 100%  stenosed. Ost RCA lesion is 100% stenosed. Hemodynamic findings consistent with severe  pulmonary hypertension. Intra-aortic balloon pump was placed to help with hemodynamic stability Intervention was deferred unable to cross with a wire ostial LAD Intervention was otherwise deferred in favor of bypass surgery   Conclusion Evidence of moderate to severe pulmonary hypertension Mean wedge of 43 mean PA of 53 Severe dilated cardiomyopathy probably ischemic ejection fraction less than 25% Moderate calcification of the proximal coronary arteries Multiple CTO's ostial LAD proximal circumflex ostial RCA Mild to moderate collaterals right to left to the LAD Minimal right to right collaterals Minimal left to left collaterals Coronaries 75% mid to distal left main 100% ostial LAD probable CTO 100% proximal circumflex probable CTO 100% ostial RCA probable CTO Collaterals as described Intervention was deferred after unable to cross with the wire for what was thought to be suspected CTO's of the ostial LAD Intra-aortic balloon pump was placed right femoral artery to help with hemodynamic stability 7 [place] Swan was placed on left and the PA position to help with hemodynamic monitoring Patient was placed on IV heparin for anticoagulation Arrangements were made for transfer to [facility] for possible ICU CCU care and possible evaluation for coronary bypass surgery Dr. Nechama Guard accepted the patient in transfer to CCU:    Patient Profile     54 y.o. 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  presented 06/18/19 with acute anterolateral STEMI.  He underwent emergent cath and was found to have multivessel CAD.  PCI of the LAD was unsuccessful (there were no targets for CABG).  He required IABP and subsequent VAD for cardiogenic shock. - HM3 placement 6/24  - Had 2 episodes of VF requiring emergent DC-CV on 07/02/19 - Recurrent VF on 7/2 and broke spontaneously.  - 7/3 Recurrent VF at 2a.-> emergent DC-CV.  - 07/07/2019 VT/[place] ->  repeat DC_CV - 07/08/2019 VF/[place] x2--> DC-CV x2.  - 07/09/19 developed sepsis/PNA -> intubated - 07/09/19 Multiple episodes of VT/VF -> IABP placed. AV pacing via EPW He did eventually improve, transitioned to PO AAD with amiodarone and mexiletine. IABP was removed, HR improved and EPW removed, ultimately mexiletine was stopped, amio continued and able to be discharged to [facility] 07/21/2019 Notes report not a transplant candidate given smoker.   Developed VF in [place] and readmitted 07/18/2019 > emergent defibrillation started on amio gtt and mexiletine 07/20/2019 developed refractory VF requiring numerous shocks > amio gtt, lido gtt, intubated on pressors Subsequently pressors have been stopped Continued to have VF 07/05/2019 >  IABP and temp pacing  Over the weekend continued to have VF episodes, temp pacing stopped VF last night x1, defibrillated, not rebolused  Assessment & Plan    1. Refractory VF      amio gtt is @ 60 Lido gtt at @ 1 (lido level yesterday 3.2, today is pending) Off pressors On versed/fentanyl  Temp pacing > off IABP changed to 1:2 this AM  K+ 3.2 and being replaced Mag 2.1  Unable to give Ranexa and mexiletine through tube  Dr. Lovena Le will see later today, not sure that EP has more to offer   For questions or updates, please contact [place] Please consult [email or web address] for contact info under     Signed, Baldwin [place], PA-C  08/03/2019, 7:59 AM    EP attending  Patient seen and examined.  Agree with the findings as noted above.  The patient remains critically ill.  He is intubated and sedated.  His exam is unchanged from yesterday.  Laboratory evaluation demonstrates hypokalemia which is being repleted.  Of note he had 1 episode of ventricular fibrillation last night, none since.  He remains on intravenous amiodarone and low-dose lidocaine.  Would recommend continuing amiodarone intravenously.  Watch for lidocaine toxicity.  Would try to avoid RV pacing.   Could consider removal of the temporary pacing wire.  Note plans to wean intra-aortic balloon pump as per Dr. Deatra Robinson.  Overall his prognosis is guarded.  Cristopher Peru, MD

## 2019-08-03 NOTE — Progress Notes (Addendum)
Patient ID: Aaron [name], male   DOB: Dec 17, 1965, 54 y.o.   MRN: [id number]   Advanced Heart Failure VAD Team Note  PCP-Cardiologist: No primary care provider on file.   Subjective:    - Had refractory VF throughout the night 7/28 w/ ~10 shocks. Now intubated.  - Developed bradycardia and further VF 7/30 am, TTVP + IABP placed 7/30 am.   - 8/1 completed cefepime, switched to anceft. Had VF 08/02/19 shock x1   Remains on Amiodarone at 60 mg/hr, lidocaine at 1.  Lidocaine level 3.2    IABP 1:1, MAP 70s.   Yesterday diuresed with IV lasix. I/O remains positive. CVP 12-13.   Sedated on vent. No BM since 07/27/19    LVAD INTERROGATION:  HeartMate 3 LVAD:   Flow 4.2  liters/min, speed 5200, power 4 PI 4.7. < 12 PI events.   Objective:    Vital Signs:   Temp:  [98.2 F (36.8 C)-99.9 F (37.7 C)] 98.2 F (36.8 C) (08/02 0600) Pulse Rate:  [56-68] 56 (08/02 0600) Resp:  [12-40] 21 (08/02 0600) BP: (86-117)/(55-70) 93/66 (08/02 0400) SpO2:  [68 %-99 %] 99 % (08/02 0600) Arterial Line BP: (91-126)/(43-56) 104/51 (08/02 0600) FiO2 (%):  [40 %] 40 % (08/02 0400) Weight:  [95.6 kg] 95.6 kg (08/02 0317) Last BM Date: 07/27/19 Mean arterial Pressure 70s  Intake/Output:   Intake/Output Summary (Last 24 hours) at 08/03/2019 0656 Last data filed at 08/03/2019 0600 Gross per 24 hour  Intake 4335.06 ml  Output 4495 ml  Net -159.94 ml     Physical Exam  CVP 12-13 Physical Exam: GENERAL: Sedated on Vent.  HEENT: ETT  NECK: Supple, JVP difficult to assess .  2+ bilaterally, no bruits.  No lymphadenopathy or thyromegaly appreciated.   CARDIAC:  Mechanical heart sounds with LVAD hum present.  LUNGS:  Clear to auscultation bilaterally.  ABDOMEN:  Soft, round, nontender, positive bowel sounds x4.     LVAD exit site: well-healed and incorporated.  Dressing dry and intact.  No erythema or drainage.  Stabilization device present and accurately applied.  Driveline dressing is being changed  daily per sterile technique. EXTREMITIES:  Warm and dry, no cyanosis, clubbing, rash or edema . LUE a line.  NEUROLOGIC: Sedated on vent.   Telemetry   NSR 50s   Labs   Basic Metabolic Panel: Recent Labs  Lab 07/05/2019 0325 08/01/2019 1742 07/30/19 0437 07/30/19 1047 07/30/19 1235 07/30/19 1730 07/08/2019 0315 07/16/2019 0322 08/01/19 0322 08/01/19 0322 08/01/19 1440 08/01/19 1440 08/02/19 0421 08/02/19 1546 08/03/19 0257  NA   < >  --  127*   < >   < >  --  127*   < > 128*  --  129*  --  129* 129* 132*  K   < >  --  3.1*   < >   < >  --  3.5   < > 4.2  --  4.6  --  4.0 3.5 3.2*  CL   < >  --  95*   < >   < >  --  97*   < > 100  --  100  --  100 100 100  CO2   < >  --  21*   < >   < >  --  19*   < > 19*  --  20*  --  19* 18* 21*  GLUCOSE   < >  --  138*   < >   < >  --  138*   < > 131*  --  134*  --  136* 143* 119*  BUN   < >  --  21*   < >   < >  --  28*   < > 26*  --  32*  --  42* 50* 56*  CREATININE   < >  --  0.95   < >   < >  --  1.20   < > 0.93  --  1.05  --  1.18 1.36* 1.47*  CALCIUM   < >  --  8.3*   < >   < >  --  8.0*   < > 8.1*   < > 7.9*   < > 7.9* 7.9* 8.0*  MG   < >  --  1.9  --   --   --  2.0  --  2.5*  --   --   --  2.2  --  2.1  PHOS  --  4.5 3.6  --   --  3.4 3.7  --   --   --   --   --   --   --   --    < > = values in this interval not displayed.    Liver Function Tests: Recent Labs  Lab 08/01/19 0322  AST 60*  ALT 71*  ALKPHOS 157*  BILITOT 1.0  PROT 6.0*  ALBUMIN 2.2*   No results for input(s): LIPASE, AMYLASE in the last 168 hours. No results for input(s): AMMONIA in the last 168 hours.  CBC: Recent Labs  Lab 07/21/2019 0315 07/20/2019 0322 08/01/19 0319 08/01/19 0322 08/02/19 0421 08/02/19 1546 08/03/19 0257  WBC 11.4*  --   --  10.8* 9.6 9.7 9.0  HGB 10.0*   < > 9.9* 9.4* 8.7* 8.8* 8.4*  HCT 32.2*   < > 29.0* 30.5* 28.3* 27.9* 27.5*  MCV 92.0  --   --  93.6 95.0 93.0 92.3  PLT 176  --   --  153 152 152 159   < > = values in this  interval not displayed.    INR: Recent Labs  Lab 07/25/2019 0315 07/12/2019 1233 08/01/19 0322 08/02/19 0421 08/03/19 0257  INR 2.2* 2.0* 1.5* 1.5* 1.3*    Other results:    Imaging   DG CHEST PORT 1 VIEW  Result Date: 08/02/2019 CLINICAL DATA:  Acute respiratory failure with hypoxia. Follow-up exam. EXAM: PORTABLE CHEST 1 VIEW COMPARISON:  08/01/2019 and older studies. FINDINGS: Bilateral airspace lung opacities are unchanged. No new lung abnormalities. Probable small effusions. No pneumothorax. Stable left ventricular assist device and stable cardiomegaly. Endotracheal tube and nasogastric tube as well as the right-sided PICC are stable, as is the femoral placed cardiac pacing wire IMPRESSION: 1. No change from previous day's exam. 2. Persistent bilateral airspace lung opacities with probable pleural effusions, consistent with pulmonary edema. Electronically Signed   By: Lajean Manes M.D.   On: 08/02/2019 09:16   DG CHEST PORT 1 VIEW  Result Date: 08/01/2019 CLINICAL DATA:  54 year old male with CHF and refractory ventricular fibrillation status post resuscitation EXAM: PORTABLE CHEST 1 VIEW COMPARISON:  Prior chest x-ray 07/27/2019 FINDINGS: Patient remains intubated. The tip of the endotracheal tube is 3 cm above the carina. External defibrillator pads project over the chest. Patient has a left ventricular assist device as well in unchanged position. A gastric tube is present. The proximal side hole can be seen overlying the visualized portion of  the gastric bubble. A right upper extremity approach PICC is also present. The tip is at the cavoatrial junction. Patient is status post median sternotomy. Stable cardiomegaly. Mild pulmonary edema. Bibasilar airspace opacities are unchanged. No pneumothorax or large effusion. New transvenous pacing lead overlying the right atrium. This comes via a femoral approach. IMPRESSION: New femoral approach transvenous cardiac pacing lead projects over the  right ventricle. Otherwise, stable chest x-ray without significant interval change over the last 24 hours. Electronically Signed   By: Jacqulynn Cadet M.D.   On: 08/01/2019 12:51     Medications:     Scheduled Medications: . aspirin  81 mg Per Tube Daily  . atorvastatin  80 mg Per Tube Daily  . chlorhexidine gluconate (MEDLINE KIT)  15 mL Mouth Rinse BID  . Chlorhexidine Gluconate Cloth  6 each Topical Daily  . clonazePAM  0.5 mg Per Tube BID  . digoxin  0.125 mg Per Tube Daily  . docusate  100 mg Per Tube BID  . feeding supplement (PROSource TF)  90 mL Per Tube TID  . folic acid  1 mg Per Tube Daily  . free water  20 mL Per Tube Q4H  . furosemide  80 mg Intravenous BID  . gabapentin  300 mg Per Tube Q8H  . mouth rinse  15 mL Mouth Rinse 10 times per day  . multivitamin  15 mL Per Tube Daily  . pantoprazole sodium  40 mg Per Tube Daily  . polyethylene glycol  17 g Oral Daily  . potassium chloride  40 mEq Oral Once  . sildenafil  20 mg Per Tube TID  . sodium chloride flush  10-40 mL Intracatheter Q12H  . thiamine  100 mg Per Tube Daily    Infusions: . sodium chloride    . sodium chloride Stopped (08/03/19 0600)  . sodium chloride 20 mL/hr at 08/01/19 1700  . amiodarone 60 mg/hr (08/03/19 0600)  .  ceFAZolin (ANCEF) IV 2 g (08/03/19 0600)  . feeding supplement (VITAL AF 1.2 CAL) 1,000 mL (08/03/19 0504)  . fentaNYL infusion INTRAVENOUS 250 mcg/hr (08/03/19 0600)  . heparin 1,150 Units/hr (08/03/19 0600)  . lidocaine 1 mg/min (08/03/19 0600)  . midazolam 2 mg/hr (08/03/19 0600)  . phenylephrine (NEO-SYNEPHRINE) Adult infusion Stopped (08/02/19 0805)  . potassium chloride 10 mEq (08/03/19 4332)  . vasopressin Stopped (07/26/2019 0051)    PRN Medications: Place/Maintain arterial line **AND** sodium chloride, sodium chloride, acetaminophen, fentaNYL, midazolam, sodium chloride flush   Assessment/Plan:    1. Refractory Ventricular Fibrillation  -multiple episodes ~10 of  VF on 7/28 - multiple episodes 7/30 am => IABP placed as well as TTVP given relative bradycardia.  Continued to have VF 7/30 afternoon and also 7/31 early am.  - Ranexa and Mexiletine currently on hold (unable to crush and administer through tube). Plan to resume Ranexa 500 mg bid + Mexiletine 200 mg bid once extubated  - Had VF last night ~ 2200. Shock x1. .  - Continue lidocaine gtt at 1, level 3.2.   - Continue amiodarone gtt at 60 mg/hr.   - Sotalol not a good option given his history of bradycardia.  -  NSR in 50-60s .  Removal per EP.   - Keep K > 4.0 and Mg > 2.0. - Bedside echo 7/28 cannula ok.  MAP improved post-IABP, wonder if we may actually be able to increase LVAD speed eventually to improve output and decrease drive for VT.  For now, will continue IABP  with no LVAD speed changes.    2. Acute Systolic Heart Failure s/p VAD - EF 81%, RV systolic function mildly reduced - HMIII LVAD placed 06/25/19.  - RV looks good on echo, cannula ok  - Co-ox 86%. Doubt accurate. Repeat now.  - avoid inotropes with VF - Continue digoxin, level 0.4 on 7/31.  - Heparin gtt with IABP, off warfarin.  - Remains volume overloaded. CVP 12-13. Continue to diurese with IV lasix 80 mg twice a day.   - Replace K.   3. Acute hypoxic resp failure - intubated in setting of VF - vent management per PCCM   4. CAD - S/p Acute STEMI 6/21.Emergent cath showed severe multivessel disease w/ 75% mid- distal LM, 100% prox LAD, not amendable to PCI (unable to cross w/ guide wire, probable CTO), 100% prox LCx (probable CTO) and 100% ostial RCA (probable CTO). There were mildto moderate collaterals right to left to the LAD, minimal right to right collateralsan minimalleft to left collaterals.LVEF 25% -Hs trop peaked 27,000. - No targets for CABG / PCI.  - S/P HMIII VAD - Continue ASA + statin   5. Renal mass = RCC - MRI abdomen- 1.8 cm right renal lesion, most consistent with renal cell carcinoma.  No evidence of renal vein involvement or nodal metastasis. - Discussed w/ Urology and R RCC is small and will just require surveillance. Ok to proceed with VAD from their standpoint.   6. Urinary retention - Foley placed - UA + for UTI. On abx  7. ID:  - ?HCAP => Klebsiella in trach aspirate from 7/29.  - Completed cefepime 08/02/19, switched to ancef.   8. Anemia  -Hgb trending down 9.9>8.7>8.4  - No obvious source of bleeding.  - Repeat CBC in am. Transfuse Hgb < 7.5     Add sorbitol today. No BM since 07/27/19.   Darrick Grinder, NP  6:56 AM  08/03/2019  Patient seen with NP, agree with the above note.    He remains on IABP at 1:1 with lidocaine 1 and amiodarone 60.  He had 1 episode of VF yesterday evening with cardioversion (decreased burden compared to the prior day).  I/Os mildly negative but creatinine up to 1.47.  CVP 12 today.  MAP stable, HR NSR in 50s.   General: Intubated  HEENT: Normal. Neck: Supple, JVP 10-12 cm. Carotids OK.  Cardiac:  Mechanical heart sounds with LVAD hum present.  Lungs:  CTAB, normal effort.  Abdomen:  NT, ND, no HSM. No bruits or masses. +BS  LVAD exit site: Well-healed and incorporated. Dressing dry and intact. No erythema or drainage. Stabilization device present and accurately applied. Driveline dressing changed daily per sterile technique. Extremities:  Warm and dry. No cyanosis, clubbing, rash. 1+ ankle edema.  Neuro:  Awakens and follows commands.   Episode VF yesterday with DCCV x 1.  Decreased burden.  - Continue lidocaine, level ok today.  Eventual wean to mexiletine, ranolazine when extubated.  - Continue amiodarone gtt.  - Will wean IABP to 1:2 today and see how he does.  If no further arrhythmias, ?remove tomorrow.   CVP 12 but creatinine rising.  Will give Lasix 80 mg IV bid today, no metolazone.   Hgb falling slowly in setting of IABP. Follow closely.    Off coumadin, on heparin with IABP.   CRITICAL CARE Performed by:  Loralie Champagne  Total critical care time: 40 minutes  Critical care time was exclusive of separately billable procedures and treating  other patients.  Critical care was necessary to treat or prevent imminent or life-threatening deterioration.  Critical care was time spent personally by me on the following activities: development of treatment plan with patient and/or surrogate as well as nursing, discussions with consultants, evaluation of patient's response to treatment, examination of patient, obtaining history from patient or surrogate, ordering and performing treatments and interventions, ordering and review of laboratory studies, ordering and review of radiographic studies, pulse oximetry and re-evaluation of patient's condition.  Loralie Champagne 08/03/2019 7:35 AM

## 2019-08-03 NOTE — Progress Notes (Signed)
[place] for Heparin Indication: LVAD>IABP  Allergies  Allergen Reactions  . Codeine Hives    Patient Measurements: Height: 5\' 9"  (175.3 cm) Weight: (!) 95.1 kg (209 lb 10.5 oz) IBW/kg (Calculated) : 70.7 Heparin Dosing Weight: ~ 92 kg  Vital Signs: Temp: 99 F (37.2 C) (08/02 0100) Temp Source: Bladder (08/02 0000) BP: 117/55 (08/02 0000) Pulse Rate: 60 (08/02 0100)  Labs: Recent Labs    07/14/2019 1233 07/02/2019 1706 08/01/19 0322 08/01/19 0322 08/01/19 1440 08/02/19 0421 08/02/19 1546 08/03/19 0107  HGB  --    < > 9.4*   < >  --  8.7* 8.8*  --   HCT  --    < > 30.5*  --   --  28.3* 27.9*  --   PLT  --   --  153  --   --  152 152  --   LABPROT 21.8*  --  17.6*  --   --  17.5*  --   --   INR 2.0*  --  1.5*  --   --  1.5*  --   --   HEPARINUNFRC  --   --  <0.10*   < >  --  <0.10* <0.10* 0.11*  CREATININE  --   --  0.93   < > 1.05 1.18 1.36*  --    < > = values in this interval not displayed.    Estimated Creatinine Clearance: 69.9 mL/min (A) (by C-G formula based on SCr of 1.36 mg/dL (H)).  Assessment: 54 yo male s/p LVAD HM3 placement 6/24, followed by IABP 7/8-7/13. 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  Heparin level undetectable, no S/Sx bleeding per RN, CBC stable this am.  8/2 AM update:   Heparin level sub-therapeutic  No issues per RN  Goal of Therapy:  Heparin level goal 0.2-0.5 units/mL INR 2-2.5  Monitor platelets by anticoagulation protocol: Yes   Plan:  Increase IV heparin to 1150 units/h Recheck heparin level in 6-8hr  Narda Bonds, PharmD, [place] Pharmacist Phone: [phone]

## 2019-08-03 NOTE — Progress Notes (Signed)
[place] for Heparin Indication: LVAD>IABP  Allergies  Allergen Reactions  . Codeine Hives    Patient Measurements: Height: 5\' 9"  (175.3 cm) Weight: (!) 95.6 kg (210 lb 12.2 oz) IBW/kg (Calculated) : 70.7 Heparin Dosing Weight: ~ 92 kg  Vital Signs: Temp: 98.4 F (36.9 C) (08/02 1000) Temp Source: Bladder (08/02 0400) BP: 112/57 (08/02 0800) Pulse Rate: 29 (08/02 1000)  Labs: Recent Labs    08/01/19 0322 08/01/19 1440 08/02/19 0421 08/02/19 0421 08/02/19 1546 08/03/19 0107 08/03/19 0257 08/03/19 0912  HGB 9.4*  --  8.7*   < > 8.8*  --  8.4*  --   HCT 30.5*  --  28.3*  --  27.9*  --  27.5*  --   PLT 153  --  152  --  152  --  159  --   LABPROT 17.6*  --  17.5*  --   --   --  16.1*  --   INR 1.5*  --  1.5*  --   --   --  1.3*  --   HEPARINUNFRC <0.10*  --  <0.10*   < > <0.10* 0.11*  --  0.27*  CREATININE 0.93   < > 1.18  --  1.36*  --  1.47*  --    < > = values in this interval not displayed.    Estimated Creatinine Clearance: 64.8 mL/min (A) (by C-G formula based on SCr of 1.47 mg/dL (H)).  Assessment: 54 yo male s/p LVAD HM3 placement 6/24, followed by IABP 7/8-7/13. 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  Heparin level within goal range this AM, no S/Sx bleeding per RN, Hgb trending down.  Goal of Therapy:  Heparin level goal 0.2-0.5 Monitor platelets by anticoagulation protocol: Yes   Plan:  Continue IV heparin at current rate of 1150 units/hr. Daily heparin level.  [place] Crane, Roylene Reason, BCCP Clinical Pharmacist  08/03/2019 10:33 AM   [facility] pharmacy phone numbers are listed on [email or web address]

## 2019-08-03 NOTE — Progress Notes (Signed)
39mL fentanyl wasted and witnessed with Leafy Kindle, RN in stericycle d/t expiring tubing.

## 2019-08-03 NOTE — Progress Notes (Signed)
Notified cardiologist on call regarding pt's potassium of 3.2 this morning, no standing orders.  MD states, "I'll put it in, thank you."

## 2019-08-03 NOTE — Progress Notes (Signed)
LVAD Coordinator Rounding Note:  HM III LVAD implanted on 06/25/19 by Dr. Orvan Seen under Destination Therapy criteria due to recent smoking history.  Pt's transferred to CIR on 07/24/19. Had refractory VF 07/17/2019 and transferred back to [place].   Pt remains intubated, sedated. Nurse reports pt had episode of VF/VT last night with one shock w/conversion to SR.  Remains in SR this am; nurse reports some episodes of sinus bradycardia.   IABP remains in place at 1:2   Vital signs: Temp: 98.8 HR: 61 Art. Line:  116/53 (71) Doppler:  Not done O2 Sat: 97% on 40% FiO2 5 CPAP Wt: 189.6>196.4>200.8>209>209.6>210.7 lbs  LVAD parameters: Speed:  5200 Flow: 4.1 Power:  3.7w PI: 5.8 Hct: 28  Alarms: none Events: > 120 PI events today  Fixed speed: 5200 Low speed limit: 4900    Drive Line:  Existing VAD dressing removed and site care performed using sterile technique. Drive line exit site cleaned with sterile saline x 2, allowed to dry, and gauze dressing with NO silver strip or skin prep used due to CPG and skin sensitivity. Exit site remains reddened with scant amount brown drainage; near complete incorporation, the velour is fully implanted at exit site. No tenderness, foul odor or rash noted. Drive line anchor replaced.  Will advance to q 3 day dressing changes per bedside nurse. Next dressing change due: 08/06/19.   Labs:  LDH trend: 326>250>256>358>297>309  INR trend: 2.1>3.5>2.2>1.5>1.5>1.3  Anticoagulation Plan: -INR Goal:  2.0 - 2.5 -ASA Dose: 81 mg - will keep going for CAD for 3 mths stop date 09/24/25  Device: N/A  IABP placed: 07/04/19  Drips: - Amiodarone 60 mg/hr - Fentanyl 250 mcg/ml - Lidocaine 1 mg/min - Versed 2 mg/hr   Infection: - 07/04/2019 - BCs>>negative - 07/26/2019 - urine culture>>negative - 07/30/19 - respiratory culture>>Klebsiella pneumoniae  Arrythmias:  - 07/19/2019 Vfib while in CIR; emergently transferred back to 2H>> DCCV back to NSR; Amio gtt - 07/23/2019  recurrent VF>>increased amio gtt; [place] bolus/gtt; DC-CV 200J to NSR - 07/05/2019 recurrent VF>>8 separate DC-CV throughout night; required pressors for BP support and re-intubation - 07/26/2019 recurrent VT/VF>>7 separate DC-CV throughout am; IABP and pacer urgently placed in Cath Lab - 07/05/2019 recurrent VT/VF>>2 additional DC-CV after IABP placment - 08/01/19 recurrent VT/VF>> pt had 3 episodes of VT/VR with 4 DC-CV - 08/02/19 recurrent VT/VF>>one DC-CV to SR  Respiratory:  - intubated 07/18/2019    Plan/Recommendations:  1. Call VAD pager if any VAD equipment or drive line issues. 2. Every three day dressing changes per bedside nurse. 3 Planned ramp echo at bedside tomorrow at 1:00pm per Dr. Aundra Dubin.  Zada Girt RN [place] Coordinator  Office: [phone]  24/7 Pager: [phone]

## 2019-08-03 NOTE — Progress Notes (Signed)
NAME:  Aaron [name], MRN:  [id number], DOB:  03-18-1965, LOS: 6 ADMISSION DATE:  07/17/2019, CONSULTATION DATE:  07/30/19 REFERRING MD:  Heart Failure, CHIEF COMPLAINT:  Respiratory failure -- VF, LVAD   Brief History   54 yo PMH acute systolic heart failure s/p LVAD (06/25/19), recurrent VT/VF/Torsades, multivessel CAD, R renal mass c/f RCC, suspected OSA, who presents back to ICU after prolonged hospital course after which he was transferred to [facility] 7/23. At CIR, pt developed dyspnea, hypoxia following PT 7/27. ECG obtained and reveals VF rate 210 bpm. Transferred to CVICU 7/27 for emergent DCCV. Intubated by anesthesia. DCCV performed numerous times 7/27, 7/28. Remains intubated requiring frequent cardioversion.  PCCM consulted 7/29 for vent management   Past Medical History  Acute systolic heart failure s/p LVAD CAD Recurrent VF, VT, torsades  Renal mass Debility Sleep apnea   Significant Hospital Events   6/16 admitted with acute anterolateral STEMI, cardiogenic shock requiring IABP 6/25 LVAD placement 7/03 VT/VT/Torsades requiring multiple emergent DCCV  7/06 recurrent VT/VT/Torsades requiring multiple emergent DCCV  7/07 recurrent VT/VT/Torsades requiring multiple emergent DCCV  7/08 PNA, respiratory failure, required intubation 7/13 IABP removed, extubated 7/23 discharred to CIR 7/27 recurrent VF, admit to CVICU for DCCV. Intubated 7/28 recurrent VF, DCCV multiple times 7/29 PCCM consult for vent  7/30 vt overnight defib x2 7/31 DCCV x2, improved on versed / no further shocks  Consults:  EP PCCM  Procedures:  ETT 7/27 >> Art line 7/28 >> RUE PICC 6/28 >>  R Femoral Venous Sheath 7/30 >>  IABP 7/30 >>   Significant Diagnostic Tests:    Micro Data:  7/28 MRSA > neg 7/28 BCx >>  7/28 UCx >>   7/29 tracheal aspirate >> few klesiella pneumoniae >> R-ampicillin, S-cefazolin  Antimicrobials:  Vanc 7/27 >> 7/29 Cefepime 7/27 >> 8/1 Cefazolin 8/1 >>  Interim  history/subjective:  Net even. Had another run of Vtach/vfib overnight. Sedate but following commands.  Objective   Blood pressure 93/66, pulse 55, temperature 98.2 F (36.8 C), resp. rate 18, height 5\' 9"  (1.753 m), weight (!) 95.6 kg, SpO2 96 %. CVP:  [9 mmHg-18 mmHg] 12 mmHg  Vent Mode: PRVC FiO2 (%):  [40 %] 40 % Set Rate:  [18 bmp] 18 bmp Vt Set:  [560 mL] 560 mL PEEP:  [5 cmH20] 5 cmH20 Plateau Pressure:  [10 NWG95-62 cmH20] 10 cmH20   Intake/Output Summary (Last 24 hours) at 08/03/2019 0711 Last data filed at 08/03/2019 1308 Gross per 24 hour  Intake 4432.46 ml  Output 4570 ml  Net -137.54 ml   Filed Weights   08/01/19 0700 08/02/19 0615 08/03/19 0317  Weight: (!) 94.8 kg (!) 95.1 kg (!) 95.6 kg    Examination: GEN: no acute distress on vent HEENT: ETT in place, minimal secretions CV: LVAD hum, ext warm PULM: rhonci bilaterally, triggering vent GI: protuberant, hypoactive BS EXT: diffuse anasarca NEURO: moves all 4 ext to command PSYCH: RASS -1 SKIN: No rashes  SvO2 86% K low BUN/Cr up CBC stable CBG okay  Resolved Hospital Problem list   Shock   Assessment & Plan:   Acute Hypoxic Respiratory Failure in setting of Recurrent VF Klebsiella Pneumoniae PNA Sepsis due to UTI, Klebsiella Pneumoniae PNA -PRVC 8cc/kg -VAP prevention measures  -Would not consider extubation until we can control Vfib without IABP -cefazolin, complete 7 days total (8/3 end date) -consider WUA / SBT am 8/2 pending review with Cardiology   Sedation Needs for Mechanical Ventilation  -PAD  protocol with versed, fentanyl gtt for RASS Goal -1 to -2  Recurrent VFib requiring repeated DCCV -Cardiology / EP following  -continue lidocaine, amiodarone, digoxin -optimize electrolytes, goal K>4, Mg>2  Acute systolic heart failure s/p LVAD placement (06/25/19) -continue supportive measures    Coagulopathy, on warfarin PTA -heparin per pharmacy > on set rate, no titration  At Risk  Malnutrition in setting of Critical Illness -continue TF   Best practice:  Diet: TF  Pain/Anxiety/Delirium protocol (if indicated): Fentanyl + Versed gtt VAP protocol (if indicated): yes DVT prophylaxis: heparin per pharmacy  GI prophylaxis: protonix  Glucose control: SSI Mobility: BR Code Status: Full  Family Communication: Per primary Disposition: ICU     The patient is critically ill with multiple organ systems failure and requires high complexity decision making for assessment and support, frequent evaluation and titration of therapies, application of advanced monitoring technologies and extensive interpretation of multiple databases. Critical Care Time devoted to patient care services described in this note independent of APP/resident time (if applicable)  is 32 minutes.   Erskine Emery MD [facility] 08/03/2019 7:16 AM Personal pager: [phone] If unanswered, please page CCM On-call: [phone]

## 2019-08-04 ENCOUNTER — Inpatient Hospital Stay (HOSPITAL_COMMUNITY): Payer: BC Managed Care – PPO

## 2019-08-04 ENCOUNTER — Other Ambulatory Visit (HOSPITAL_COMMUNITY): Payer: BC Managed Care – PPO

## 2019-08-04 DIAGNOSIS — I5021 Acute systolic (congestive) heart failure: Secondary | ICD-10-CM

## 2019-08-04 LAB — GLUCOSE, CAPILLARY
Glucose-Capillary: 115 mg/dL — ABNORMAL HIGH (ref 70–99)
Glucose-Capillary: 115 mg/dL — ABNORMAL HIGH (ref 70–99)
Glucose-Capillary: 115 mg/dL — ABNORMAL HIGH (ref 70–99)
Glucose-Capillary: 115 mg/dL — ABNORMAL HIGH (ref 70–99)
Glucose-Capillary: 179 mg/dL — ABNORMAL HIGH (ref 70–99)
Glucose-Capillary: 158 mg/dL — ABNORMAL HIGH (ref 70–99)
Glucose-Capillary: 112 mg/dL — ABNORMAL HIGH (ref 70–99)

## 2019-08-04 LAB — POCT I-STAT 7, (LYTES, BLD GAS, ICA,H+H)
Acid-base deficit: 1 mmol/L (ref 0.0–2.0)
Bicarbonate: 25.1 mmol/L (ref 20.0–28.0)
Calcium, Ion: 1.13 mmol/L — ABNORMAL LOW (ref 1.15–1.40)
HCT: 28 % — ABNORMAL LOW (ref 39.0–52.0)
Hemoglobin: 9.5 g/dL — ABNORMAL LOW (ref 13.0–17.0)
O2 Saturation: 99 %
Patient temperature: 37.3
Potassium: 5.5 mmol/L — ABNORMAL HIGH (ref 3.5–5.1)
Sodium: 135 mmol/L (ref 135–145)
TCO2: 27 mmol/L (ref 22–32)
pCO2 arterial: 47 mmHg (ref 32.0–48.0)
pH, Arterial: 7.338 — ABNORMAL LOW (ref 7.350–7.450)
pO2, Arterial: 148 mmHg — ABNORMAL HIGH (ref 83.0–108.0)

## 2019-08-04 LAB — BASIC METABOLIC PANEL
Anion gap: 15 (ref 5–15)
Anion gap: 10 (ref 5–15)
Anion gap: 15 (ref 5–15)
BUN: 72 mg/dL — ABNORMAL HIGH (ref 6–20)
BUN: 77 mg/dL — ABNORMAL HIGH (ref 6–20)
BUN: 77 mg/dL — ABNORMAL HIGH (ref 6–20)
CO2: 19 mmol/L — ABNORMAL LOW (ref 22–32)
CO2: 21 mmol/L — ABNORMAL LOW (ref 22–32)
CO2: 20 mmol/L — ABNORMAL LOW (ref 22–32)
Calcium: 8 mg/dL — ABNORMAL LOW (ref 8.9–10.3)
Calcium: 8.1 mg/dL — ABNORMAL LOW (ref 8.9–10.3)
Calcium: 8.3 mg/dL — ABNORMAL LOW (ref 8.9–10.3)
Chloride: 99 mmol/L (ref 98–111)
Chloride: 102 mmol/L (ref 98–111)
Chloride: 100 mmol/L (ref 98–111)
Creatinine, Ser: 1.71 mg/dL — ABNORMAL HIGH (ref 0.61–1.24)
Creatinine, Ser: 1.74 mg/dL — ABNORMAL HIGH (ref 0.61–1.24)
Creatinine, Ser: 1.83 mg/dL — ABNORMAL HIGH (ref 0.61–1.24)
GFR calc Af Amer: 51 mL/min — ABNORMAL LOW (ref >60)
GFR calc Af Amer: 50 mL/min — ABNORMAL LOW (ref >60)
GFR calc Af Amer: 47 mL/min — ABNORMAL LOW (ref >60)
GFR calc non Af Amer: 44 mL/min — ABNORMAL LOW (ref >60)
GFR calc non Af Amer: 43 mL/min — ABNORMAL LOW (ref >60)
GFR calc non Af Amer: 41 mL/min — ABNORMAL LOW (ref >60)
Glucose, Bld: 116 mg/dL — ABNORMAL HIGH (ref 70–99)
Glucose, Bld: 147 mg/dL — ABNORMAL HIGH (ref 70–99)
Glucose, Bld: 162 mg/dL — ABNORMAL HIGH (ref 70–99)
Potassium: 3.8 mmol/L (ref 3.5–5.1)
Potassium: 4.2 mmol/L (ref 3.5–5.1)
Potassium: 3.6 mmol/L (ref 3.5–5.1)
Sodium: 133 mmol/L — ABNORMAL LOW (ref 135–145)
Sodium: 133 mmol/L — ABNORMAL LOW (ref 135–145)
Sodium: 135 mmol/L (ref 135–145)

## 2019-08-04 LAB — LIDOCAINE LEVEL: Lidocaine Lvl: 2.1 ug/mL (ref 1.5–5.0)

## 2019-08-04 LAB — CBC
HCT: 28.2 % — ABNORMAL LOW (ref 39.0–52.0)
Hemoglobin: 8.5 g/dL — ABNORMAL LOW (ref 13.0–17.0)
MCH: 28 pg (ref 26.0–34.0)
MCHC: 30.1 g/dL (ref 30.0–36.0)
MCV: 92.8 fL (ref 80.0–100.0)
Platelets: 179 10*3/uL (ref 150–400)
RBC: 3.04 MIL/uL — ABNORMAL LOW (ref 4.22–5.81)
RDW: 17.3 % — ABNORMAL HIGH (ref 11.5–15.5)
WBC: 8 10*3/uL (ref 4.0–10.5)
nRBC: 0.2 % (ref 0.0–0.2)

## 2019-08-04 LAB — ECHOCARDIOGRAM COMPLETE
Area-P 1/2: 4.1 cm2
Height: 69 in
S' Lateral: 4.7 cm
Weight: 3425.07 oz

## 2019-08-04 LAB — LACTATE DEHYDROGENASE: LDH: 266 U/L — ABNORMAL HIGH (ref 98–192)

## 2019-08-04 LAB — COOXEMETRY PANEL
Carboxyhemoglobin: 1.4 % (ref 0.5–1.5)
Methemoglobin: 0.8 % (ref 0.0–1.5)
O2 Saturation: 76.6 %
Total hemoglobin: 8.6 g/dL — ABNORMAL LOW (ref 12.0–16.0)

## 2019-08-04 LAB — PROTIME-INR
INR: 1.3 — ABNORMAL HIGH (ref 0.8–1.2)
Prothrombin Time: 15.6 seconds — ABNORMAL HIGH (ref 11.4–15.2)

## 2019-08-04 LAB — MAGNESIUM
Magnesium: 2.1 mg/dL (ref 1.7–2.4)
Magnesium: 2.2 mg/dL (ref 1.7–2.4)
Magnesium: 2.4 mg/dL (ref 1.7–2.4)

## 2019-08-04 LAB — HEPARIN LEVEL (UNFRACTIONATED): Heparin Unfractionated: 0.18 IU/mL — ABNORMAL LOW (ref 0.30–0.70)

## 2019-08-04 MED ORDER — FUROSEMIDE 10 MG/ML IJ SOLN
80.0000 mg | Freq: Once | INTRAMUSCULAR | Status: AC
  Administered 2019-08-04: 80 mg via INTRAVENOUS
  Filled 2019-08-04: qty 8

## 2019-08-04 MED ORDER — POTASSIUM CHLORIDE 10 MEQ/50ML IV SOLN
10.0000 meq | INTRAVENOUS | Status: AC
  Administered 2019-08-04 (×2): 10 meq via INTRAVENOUS
  Filled 2019-08-04: qty 50

## 2019-08-04 MED ORDER — SORBITOL 70 % SOLN
30.0000 mL | Freq: Four times a day (QID) | Status: AC
  Administered 2019-08-04 (×2): 30 mL
  Filled 2019-08-04 (×2): qty 30

## 2019-08-04 MED ORDER — AMIODARONE HCL IN DEXTROSE 360-4.14 MG/200ML-% IV SOLN
30.0000 mg/h | INTRAVENOUS | Status: AC
  Administered 2019-08-04 (×3): 60 mg/h via INTRAVENOUS
  Administered 2019-08-05: 30 mg/h via INTRAVENOUS
  Administered 2019-08-05: 60 mg/h via INTRAVENOUS
  Administered 2019-08-06 – 2019-08-10 (×10): 30 mg/h via INTRAVENOUS
  Filled 2019-08-04 (×3): qty 200
  Filled 2019-08-04: qty 400
  Filled 2019-08-04: qty 200
  Filled 2019-08-04: qty 400
  Filled 2019-08-04 (×8): qty 200

## 2019-08-04 MED ORDER — MIDODRINE HCL 5 MG PO TABS
5.0000 mg | ORAL_TABLET | Freq: Three times a day (TID) | ORAL | Status: DC
  Administered 2019-08-04 (×3): 5 mg via ORAL
  Filled 2019-08-04 (×3): qty 1

## 2019-08-04 MED ORDER — AMIODARONE IV BOLUS ONLY 150 MG/100ML
150.0000 mg | Freq: Once | INTRAVENOUS | Status: AC
  Administered 2019-08-04: 150 mg via INTRAVENOUS

## 2019-08-04 MED ORDER — POTASSIUM CHLORIDE 10 MEQ/100ML IV SOLN: 10.0000 meq | INTRAVENOUS | Status: DC

## 2019-08-04 MED ORDER — POTASSIUM CHLORIDE 20 MEQ/15ML (10%) PO SOLN
40.0000 meq | Freq: Four times a day (QID) | ORAL | Status: DC
  Administered 2019-08-04 – 2019-08-05 (×4): 40 meq via ORAL
  Filled 2019-08-04 (×4): qty 30

## 2019-08-04 MED ORDER — METOLAZONE 2.5 MG PO TABS
2.5000 mg | ORAL_TABLET | Freq: Once | ORAL | Status: AC
  Administered 2019-08-04: 2.5 mg via ORAL
  Filled 2019-08-04: qty 1

## 2019-08-04 MED ORDER — BISACODYL 10 MG RE SUPP
10.0000 mg | Freq: Once | RECTAL | Status: AC
  Administered 2019-08-04: 10 mg via RECTAL
  Filled 2019-08-04: qty 1

## 2019-08-04 MED ORDER — CHLORHEXIDINE GLUCONATE CLOTH 2 % EX PADS
6.0000 | MEDICATED_PAD | Freq: Every day | CUTANEOUS | Status: DC
  Administered 2019-08-05 – 2019-08-20 (×17): 6 via TOPICAL

## 2019-08-04 MED ORDER — FUROSEMIDE 10 MG/ML IJ SOLN
12.0000 mg/h | INTRAVENOUS | Status: DC
  Administered 2019-08-04: 10 mg/h via INTRAVENOUS
  Administered 2019-08-06: 12 mg/h via INTRAVENOUS
  Filled 2019-08-04 (×5): qty 25

## 2019-08-04 MED ORDER — BISACODYL 10 MG RE SUPP
10.0000 mg | Freq: Once | RECTAL | Status: DC
  Filled 2019-08-04: qty 1

## 2019-08-04 NOTE — Progress Notes (Addendum)
LVAD Coordinator Rounding Note:  HM III LVAD implanted on 06/25/19 by Dr. Orvan Seen under Destination Therapy criteria due to recent smoking history.  Pt's transferred to CIR on 07/24/19. Had refractory VF 07/15/2019 and transferred back to [place].   Pt remains intubated, sedated. IABP on 1:3 this morning. Plan to possibly discontinue IABP this afternoon per Dr Aundra Dubin.   Addendum: At 1158 patient went into sustained VT/VF with a rate in the uppers 180s. Patient given IV sedation bolus and defib x 1; converted to SR. At 1210 patient converted back to VT/VF that required additional defib x 1. Per Dr Aundra Dubin- turn IABP back to 1:2, give Amiodarone 150 mg IV bolus and resume drip at 60 mg/hr for 6 hours, then decrease back to 30 mg/hr.   1300: See separate note for RAMP echo documentation. Speed increased to 5400.   At 1445 patient converted to VT/VF requiring defib x 2 prior to conversion to SR. Will give 2 grams IV Magnesium and Amiodarone bolus 150 mg. VAD speed decreased to 5300. Patient has not had bowel movement since last week. Will give enema today. CVP up to 20 this afternoon (was 12 this morning.) Currently on Lasix drip at 10 mg/hr averaging 125 cc/hr urine output. Will give Metolazone 2.5mg  and replace K as needed. per Dr Aundra Dubin.     Vital signs: Temp: 98.6 HR: 62 Art. Line:  113/63 (71) Doppler:  Not done O2 Sat: 98% on 40% FiO2 5 CPAP Wt: 189.6>196.4>200.8>209>209.6>210.7>214.1 lbs  LVAD parameters: Speed:  5200 Flow: 4.4 Power:  3.6w PI: 4.0 Hct: 28  Alarms: none Events: > 120 PI events today  Fixed speed: 5200 Low speed limit: 4900    Drive Line:  Existing dressing clean, dry, intact. Anchor intact and accurately applied. Continue q 3 day dressing changes per bedside nurse. Next dressing change due: 08/06/19.   Labs:  LDH trend: 326>250>256>358>297>309>266  INR trend: 2.1>3.5>2.2>1.5>1.5>1.3>1.3  Anticoagulation Plan: -INR Goal:  2.0 - 2.5 -ASA Dose: 81 mg - will keep going  for CAD for 3 mths stop date 09/25/19  Device: N/A  IABP placed: 07/04/19  Drips: - Amiodarone 30 mg/hr - Fentanyl 200 mcg/ml - Lidocaine 1 mg/min - Versed 1 mg/hr - Lasix 10 mg/hr - Heparin 1150 mg/hr - Tube feed 50 ml/hr - Neo synephrine- on overnight, stopped this AM   Infection: - 07/13/2019 - BCs>>negative - 07/30/2019 - urine culture>>negative - 07/30/19 - respiratory culture>>Klebsiella pneumoniae  Arrythmias:  - 07/26/2019 Vfib while in CIR; emergently transferred back to 2H>> DCCV back to NSR; Amio gtt - 07/09/2019 recurrent VF>>increased amio gtt; [place]ido bolus/gtt; DC-CV 200J to NSR - 07/02/2019 recurrent VF>>8 separate DC-CV throughout night; required pressors for BP support and re-intubation - 07/30/2019 recurrent VT/VF>>7 separate DC-CV throughout am; IABP and pacer urgently placed in Cath Lab - 07/05/2019 recurrent VT/VF>>2 additional DC-CV after IABP placment - 08/01/19 recurrent VT/VF>> pt had 3 episodes of VT/VR with 4 DC-CV - 08/02/19 recurrent VT/VF>>one DC-CV to SR  Respiratory:  - intubated 07/29/19    Plan/Recommendations:  1. Call VAD pager if any VAD equipment or drive line issues. 2. Every three day dressing changes per bedside nurse. 3 Planned ramp echo at bedside today at 1:00pm per Dr. Aundra Dubin.  Emerson Monte RN [place] Coordinator  Office: [phone]  24/7 Pager: [phone]

## 2019-08-04 NOTE — Progress Notes (Signed)
Speed  Flow  PI  Power  LVIDD  AI  Aortic openings  MR  TR  Septum  RV   5200 4.5 4.0 3.7 4.9 none 4/5  mild trace Slight pull right mild   5300  4.5 3.8 3.7 4.9  5/5   midline mild    5400 4.6 4.1 3.9   4/5   Midline; slight bow left mild                                          Art BP: 105/53 (70)   Ramp ECHO performed at bedside.  At completion of ramp study, patients primary and back up controller programmed:   Fixed speed: 5400 Low speed limit: 5100   Emerson Monte RN [place] Coordinator  Office: [phone]  24/7 Pager: [phone]

## 2019-08-04 NOTE — Progress Notes (Addendum)
62  Pt went into Vtach.  [place] paged and pt given 2mg  Versed bolus.. Pt shocked x1 and is now in NSR.  VAD coordinator notified.     [place] Shock x1 per VAD team.  Amio bolus given and gtt increased to 60mg .  IABP now in 1:2.  Pt converted to NSR HR 71.   1427 Vtach/vfib. Shock x1. Converted to NSR  1438 Vtach/vifb. Shock x2 with 120j and 200j. Pt converted to NSR.  Amio bolus ordered and given.  2g Mag given.  VAD team at bedside.

## 2019-08-04 NOTE — Progress Notes (Signed)
Pt remained stable throughout the night. Restarted on low dose phenylephrine (5-71mcg) to maintain BP/MAPs >65. 12 PI events tonight on LVAD, though patient had many PI events yesterday during the day. Patient has orders for ramp echo today.   IABP remained 1:2 tonight with no notable ectopy. When patient was awake, HR would go to mid 70's and BP would rise with MAPs as high as 98. With PRN boluses, pt would rest back to RASS -2. Pt able to nod to answer RN's questions and make purposeful movements.   UOP ~20mL/hr and has ranged from clear to cloudy to pink tinged in color. Pulses remain +2 in BUE, +1 in RLE, +2 in LLE. Bowel sounds remain active, but no BM yet. Abdomen remains distended.   Pt had low grade temp (37.4C/99.85F) tonight- given PRN tylenol. Pt also very diaphoretic tonight.   AM K 3.8 this morning, cardiology paged for replacement with no response. ELink paged, orders received to give AM dose early.

## 2019-08-04 NOTE — Progress Notes (Signed)
  VF this afternoon --Shocked by nursing staff 120 J with persistent VF. Shocked 200 J x2 --NSR Given 2 gram mag.   VAD Speed cut back to 5300. Continue IABP 1:2  CVP up to 20 on lasix drip at 10 mg per hour. Give 2.5 mg x1.   Dr Aundra Dubin aware and at bedside. Agrees with plan   Vannessa Godown NP-C  3:20 PM

## 2019-08-04 NOTE — Progress Notes (Signed)
eLink Physician-Brief Progress Note Patient Name: Aaron [name] DOB: [birth year] MRN: [id number]   Date of Service  08/04/2019  HPI/Events of Note  K 3.8. Cardiology goal K > 4.0 due to significant VT episodes this admission. Scheduled for KCl 40 mEq PO BID. Cr 1.7 (eGFR ~ 50).   eICU Interventions  Informed RN she can give one of the standing doses of potassium chloride above early/now.     Intervention Category Intermediate Interventions: Electrolyte abnormality - evaluation and management  [name] 08/04/2019, 6:18 AM

## 2019-08-04 NOTE — Progress Notes (Addendum)
Patient ID: Aaron [name], male   DOB: [birth year], 54 y.o.   MRN: [id number]   Advanced Heart Failure VAD Team Note  PCP-Cardiologist: No primary care provider on file.   Subjective:    - Had refractory VF throughout the night 7/28 w/ ~10 shocks. Now intubated.  - Developed bradycardia and further VF 7/30 am, TTVP + IABP placed 7/30 am.   - 8/1 completed cefepime, switched to anceft. Had VF 08/02/19 shock x1   No VF over night. Remains on Amiodarone at 30 mg/hr, lidocaine at 1.  Lidocaine level 3.0 on 8/2   IABP 1:2, MAP low overnight. Started on Neo.    No BM since 07/27/19.   Yesterday diuresed with IV lasix. I/O remains positive.    Over night started on neo 10 mcg. Maps 50s    LVAD INTERROGATION:  HeartMate 3 LVAD:   Flow 4.4 liters/min, speed 5200, power 4 PI 4.1. 14 PI events.   Objective:    Vital Signs:   Temp:  [98.2 F (36.8 C)-99.5 F (37.5 C)] 98.8 F (37.1 C) (08/03 0700) Pulse Rate:  [29-63] 32 (08/03 0700) Resp:  [10-25] 17 (08/03 0700) BP: (93-119)/(53-86) 110/82 (08/03 0622) SpO2:  [88 %-100 %] 99 % (08/03 0700) Arterial Line BP: (85-143)/(46-64) 111/55 (08/03 0700) FiO2 (%):  [40 %] 40 % (08/03 0400) Weight:  [97.1 kg] 97.1 kg (08/03 0500) Last BM Date: 07/27/19 Mean arterial Pressure 70s  Intake/Output:   Intake/Output Summary (Last 24 hours) at 08/04/2019 0705 Last data filed at 08/04/2019 0700 Gross per 24 hour  Intake 3931.87 ml  Output 3420 ml  Net 511.87 ml     Physical Exam  CVP 12 Physical Exam: GENERAL: Intubated/sedated HEENT: ETT NECK: Supple, JVP 12-13  .  2+ bilaterally, no bruits.  No lymphadenopathy or thyromegaly appreciated.   CARDIAC:  Mechanical heart sounds with LVAD hum present.  LUNGS:  Rhonchi ABDOMEN:  Distended, round, nontender, positive bowel sounds x4.     LVAD exit site: well-healed and incorporated.  Dressing dry and intact.  No erythema or drainage.  Stabilization device present and accurately applied.     EXTREMITIES:  Warm and dry, no cyanosis, clubbing, rash. R and LLE 1-2+ edema  NEUROLOGIC:  Sedated on vent  GU: Foley with pink tinged urine.      Telemetry   NSR 50s   Labs   Basic Metabolic Panel: Recent Labs  Lab 07/04/2019 0325 07/23/2019 1742 07/30/19 0437 07/30/19 1047 07/30/19 1235 07/30/19 1730 07/26/2019 0315 07/20/2019 0322 08/01/19 0322 08/01/19 1440 08/02/19 0421 08/02/19 0421 08/02/19 1546 08/02/19 1546 08/03/19 0257 08/03/19 1130 08/03/19 1602 08/04/19 0432  NA   < >  --  127*   < >   < >  --  127*   < > 128*   < > 129*   < > 129*  --  132* 133* 132* 133*  K   < >  --  3.1*   < >   < >  --  3.5   < > 4.2   < > 4.0   < > 3.5  --  3.2* 3.4* 3.6 3.8  CL   < >  --  95*   < >   < >  --  97*   < > 100   < > 100  --  100  --  100  --  100 99  CO2   < >  --  21*   < >   < >  --  19*   < > 19*   < > 19*  --  18*  --  21*  --  20* 19*  GLUCOSE   < >  --  138*   < >   < >  --  138*   < > 131*   < > 136*  --  143*  --  119*  --  130* 116*  BUN   < >  --  21*   < >   < >  --  28*   < > 26*   < > 42*  --  50*  --  56*  --  65* 72*  CREATININE   < >  --  0.95   < >   < >  --  1.20   < > 0.93   < > 1.18  --  1.36*  --  1.47*  --  1.55* 1.71*  CALCIUM   < >  --  8.3*   < >   < >  --  8.0*   < > 8.1*   < > 7.9*   < > 7.9*   < > 8.0*  --  7.9* 8.0*  MG   < >  --  1.9  --   --   --  2.0  --  2.5*  --  2.2  --   --   --  2.1  --   --  2.1  PHOS  --  4.5 3.6  --   --  3.4 3.7  --   --   --   --   --   --   --   --   --   --   --    < > = values in this interval not displayed.    Liver Function Tests: Recent Labs  Lab 08/01/19 0322  AST 60*  ALT 71*  ALKPHOS 157*  BILITOT 1.0  PROT 6.0*  ALBUMIN 2.2*   No results for input(s): LIPASE, AMYLASE in the last 168 hours. No results for input(s): AMMONIA in the last 168 hours.  CBC: Recent Labs  Lab 08/01/19 0322 08/01/19 0322 08/02/19 0421 08/02/19 1546 08/03/19 0257 08/03/19 1130 08/04/19 0432  WBC 10.8*  --  9.6 9.7  9.0  --  8.0  HGB 9.4*   < > 8.7* 8.8* 8.4* 9.5* 8.5*  HCT 30.5*   < > 28.3* 27.9* 27.5* 28.0* 28.2*  MCV 93.6  --  95.0 93.0 92.3  --  92.8  PLT 153  --  152 152 159  --  179   < > = values in this interval not displayed.    INR: Recent Labs  Lab 07/07/2019 1233 08/01/19 0322 08/02/19 0421 08/03/19 0257 08/04/19 0432  INR 2.0* 1.5* 1.5* 1.3* 1.3*    Other results:    Imaging   DG CHEST PORT 1 VIEW  Result Date: 08/04/2019 CLINICAL DATA:  Intubation. EXAM: PORTABLE CHEST 1 VIEW COMPARISON:  08/03/2019. FINDINGS: Endotracheal tube, NG tube, right PICC line in stable position. Balloon pump tip in stable position. Prior median sternotomy. Left ventricular assist device in stable position. Stable prominent cardiomegaly. Diffuse prominent pulmonary infiltrates/edema again noted at interim change. No pleural effusion or pneumothorax. IMPRESSION: 1.  Lines and tubes in stable position. 2. Stable prominent cardiomegaly. Left ventricular assist device in stable position. 3. Diffuse bilateral pulmonary infiltrates/edema again noted without interim change. Electronically Signed   By: Maisie Fus  Register   On:  08/04/2019 06:38   DG CHEST PORT 1 VIEW  Result Date: 08/03/2019 CLINICAL DATA:  Hypoxia EXAM: PORTABLE CHEST 1 VIEW COMPARISON:  August 02, 2019. FINDINGS: Endotracheal tube tip is 3.8 cm above the carina. Nasogastric tube tip and side port are below the diaphragm. There is a left ventricular assist device. Central catheter tip is in the superior vena cava. There is a pacemaker lead attached to the right ventricle. No pneumothorax. There is widespread interstitial and airspace opacity, likely representing pulmonary edema. There is a small pleural effusion on each side. There is cardiomegaly with pulmonary venous hypertension. No adenopathy. No bone lesions. IMPRESSION: Tube and catheter positions as described without pneumothorax. Cardiomegaly with pulmonary vascular congestion. Left ventricular  assist device present. Small pleural effusions bilaterally with diffuse pulmonary edema. Appearance is consistent with congestive heart failure. Electronically Signed   By: Lowella Grip III M.D.   On: 08/03/2019 08:01     Medications:     Scheduled Medications: . alteplase  2 mg Intracatheter Once  . aspirin  81 mg Per Tube Daily  . atorvastatin  80 mg Per Tube Daily  . chlorhexidine gluconate (MEDLINE KIT)  15 mL Mouth Rinse BID  . Chlorhexidine Gluconate Cloth  6 each Topical Daily  . clonazePAM  0.5 mg Per Tube BID  . digoxin  0.125 mg Per Tube Daily  . docusate  100 mg Per Tube BID  . feeding supplement (PROSource TF)  90 mL Per Tube TID  . folic acid  1 mg Per Tube Daily  . free water  20 mL Per Tube Q4H  . furosemide  80 mg Intravenous BID  . gabapentin  300 mg Per Tube Q8H  . mouth rinse  15 mL Mouth Rinse 10 times per day  . multivitamin  15 mL Per Tube Daily  . pantoprazole sodium  40 mg Per Tube Daily  . polyethylene glycol  17 g Oral Daily  . potassium chloride  40 mEq Oral BID  . sildenafil  20 mg Per Tube TID  . sodium chloride flush  10-40 mL Intracatheter Q12H  . thiamine  100 mg Per Tube Daily    Infusions: . sodium chloride    . sodium chloride Stopped (08/04/19 6962)  . sodium chloride 20 mL/hr at 08/01/19 1700  . amiodarone 30 mg/hr (08/04/19 0700)  .  ceFAZolin (ANCEF) IV Stopped (08/04/19 0551)  . feeding supplement (VITAL AF 1.2 CAL) 1,000 mL (08/04/19 0413)  . fentaNYL infusion INTRAVENOUS 200 mcg/hr (08/04/19 0700)  . heparin 1,150 Units/hr (08/04/19 0700)  . lidocaine 1 mg/min (08/04/19 0700)  . midazolam 2 mg/hr (08/04/19 0700)  . phenylephrine (NEO-SYNEPHRINE) Adult infusion 10 mcg/min (08/04/19 0700)  . vasopressin Stopped (07/06/2019 0051)    PRN Medications: Place/Maintain arterial line **AND** sodium chloride, sodium chloride, acetaminophen, fentaNYL, midazolam, sodium chloride flush   Assessment/Plan:    1. Refractory Ventricular  Fibrillation  -multiple episodes ~10 of VF on 7/28 - multiple episodes 7/30 am => IABP placed as well as TTVP given relative bradycardia.  Continued to have VF 7/30 afternoon and also 7/31 early am.  - Ranexa and Mexiletine currently on hold (unable to crush and administer through tube). Plan to resume Ranexa 500 mg bid + Mexiletine 200 mg bid once extubated  - Had VF last night ~ 2200. Shock x1. .  - Continue lidocaine gtt at 1, level 3.0. Level for today pending. .   - Continue amiodarone gtt at 30  mg/hr.   -  Sotalol not a good option given his history of bradycardia.  -    Removal per EP.   - Keep K > 4.0 and Mg > 2.0. - Bedside echo 7/28 cannula ok.  MAP improved post-IABP, wonder if we may actually be able to increase LVAD speed eventually to improve output and decrease drive for VT.  For now, will continue IABP with no LVAD speed changes.    2. Acute Systolic Heart Failure s/p VAD - EF 47%, RV systolic function mildly reduced - HMIII LVAD placed 06/25/19.  - RV looks good on echo, cannula ok  - Co-ox 77%  Doubt accurate. Repeat now.  - avoid inotropes with VF - Continue digoxin, level 0.4 on 7/31. Bradycardic. Place hold parameters.  - Heparin gtt with IABP, off warfarin.  -IABP down to 1:2. Anticipate removing IABP  - Renal function trending up.    3. Acute hypoxic resp failure - intubated in setting of VF - vent management per PCCM   4. CAD - S/p Acute STEMI 6/21.Emergent cath showed severe multivessel disease w/ 75% mid- distal LM, 100% prox LAD, not amendable to PCI (unable to cross w/ guide wire, probable CTO), 100% prox LCx (probable CTO) and 100% ostial RCA (probable CTO). There were mildto moderate collaterals right to left to the LAD, minimal right to right collateralsan minimalleft to left collaterals.LVEF 25% -Hs trop peaked 27,000. - No targets for CABG / PCI.  - S/P HMIII VAD - Continue ASA + statin   5. Renal mass = RCC - MRI abdomen- 1.8 cm right  renal lesion, most consistent with renal cell carcinoma. No evidence of renal vein involvement or nodal metastasis. - Discussed w/ Urology and R RCC is small and will just require surveillance. Ok to proceed with VAD from their standpoint.   6. Urinary retention - Foley placed - UA + for UTI. On abx  7. ID:  - ?HCAP => Klebsiella in trach aspirate from 7/29.  - Completed cefepime 08/02/19, switched to ancef.   8. Anemia  -Hgb trending down 9.9>8.7>8.4 >9.5>8.5  - No obvious source of bleeding.  - Repeat CBC in am. Transfuse Hgb < 7.5   9. AKI Creatinine trending up.   Repeat Ramp ECHO.    Darrick Grinder, NP  7:05 AM  08/04/2019  Patient seen with NP, agree with the above note.   Co-ox 77% today with CVP 12.  He diuresed yesterday some with I/Os net even. BUN/creatinine with slow rise.  Abx transitioned to Ancef (Klebsiella in sputum culture sensitive).  No further VF/VT.   Was started on phenylephrine overnight, currently at 10 with MAP upper 70s.   General: Intubated/sedated Neck: JVP 12+, no thyromegaly or thyroid nodule.  Lungs: Clear to auscultation bilaterally with normal respiratory effort. CV: Nondisplaced PMI.  Heart regular S1/S2, no S3/S4, no murmur.  1+ edema to knees.  Abdomen: Soft, nontender, no hepatosplenomegaly, no distention.  Skin: Intact without lesions or rashes.  Neurologic: Sedated on vent. Will awaken, follow commands.   Extremities: No clubbing or cyanosis.  HEENT: Normal.   Stop phenylephrine, wean sedation some to allow him to wake up at this point and hopefully run MAP higher.  Will start midodrine 5 mg tid.   Good co-ox today with IABP 1:2.  Will wean to 1:3, if tolerates with stable MAP and no arrhythmias, can hold heparin later today and remove IABP.   Remove pacer wire and sheath today.   Continue amiodarone at 30 and lidocaine at  1. Lidocaine level yesterday ok, pending today.   Volume overload, weight up again. CVP 12. Unfortunately,  BUN/creatinine slowly rising.  Will try Lasix infusion, will give bolus 80 mg IV x 1 today then gtt 10 mg/hr.   Will do ramp echo this afternoon, ?if we can increase his speed.   He is now on Ancef for sensitive Klebsiella in sputum.   Wean vent after IABP out.   CRITICAL CARE Performed by: Loralie Champagne  Total critical care time: 40 minutes  Critical care time was exclusive of separately billable procedures and treating other patients.  Critical care was necessary to treat or prevent imminent or life-threatening deterioration.  Critical care was time spent personally by me on the following activities: development of treatment plan with patient and/or surrogate as well as nursing, discussions with consultants, evaluation of patient's response to treatment, examination of patient, obtaining history from patient or surrogate, ordering and performing treatments and interventions, ordering and review of laboratory studies, ordering and review of radiographic studies, pulse oximetry and re-evaluation of patient's condition.  Loralie Champagne  08/04/2019 7:45 AM  Patient had 2 episodes of VT this morning requiring cardioversion.   Turned IABP back to 1:2 and increased amiodarone back to 60 mg/hr with bolus.   Loralie Champagne 08/04/2019 12:21 PM

## 2019-08-04 NOTE — Progress Notes (Signed)
NAME:  Aaron [name], MRN:  [id number], DOB:  Jun 06, 1965, LOS: 7 ADMISSION DATE:  07/27/2019, CONSULTATION DATE:  07/30/19 REFERRING MD:  Heart Failure, CHIEF COMPLAINT:  Respiratory failure -- VF, LVAD   Brief History   54 yo PMH acute systolic heart failure s/p LVAD (06/25/19), recurrent VT/VF/Torsades, multivessel CAD, R renal mass c/f RCC, suspected OSA, who presents back to ICU after prolonged hospital course after which he was transferred to [facility] 7/23. At CIR, pt developed dyspnea, hypoxia following PT 7/27. ECG obtained and reveals VF rate 210 bpm. Transferred to CVICU 7/27 for emergent DCCV. Intubated by anesthesia. DCCV performed numerous times 7/27, 7/28. Remains intubated requiring frequent cardioversion.  PCCM consulted 7/29 for vent management   Past Medical History  Acute systolic heart failure s/p LVAD CAD Recurrent VF, VT, torsades  Renal mass Debility Sleep apnea   Significant Hospital Events   6/16 admitted with acute anterolateral STEMI, cardiogenic shock requiring IABP 6/25 LVAD placement 7/03 VT/VT/Torsades requiring multiple emergent DCCV  7/06 recurrent VT/VT/Torsades requiring multiple emergent DCCV  7/07 recurrent VT/VT/Torsades requiring multiple emergent DCCV  7/08 PNA, respiratory failure, required intubation 7/13 IABP removed, extubated 7/23 discharred to CIR 7/27 recurrent VF, admit to CVICU for DCCV. Intubated 7/28 recurrent VF, DCCV multiple times 7/29 PCCM consult for vent  7/30 vt overnight defib x2 7/31 DCCV x2, improved on versed / no further shocks  Consults:  EP PCCM  Procedures:  ETT 7/27 >> Art line 7/28 >> RUE PICC 6/28 >>  R Femoral Venous Sheath 7/30 >>  IABP 7/30 >>   Significant Diagnostic Tests:    Micro Data:  7/28 MRSA > neg 7/28 BCx >>  7/28 UCx >>   7/29 tracheal aspirate >> few klesiella pneumoniae >> R-ampicillin, S-cefazolin  Antimicrobials:  Vanc 7/27 >> 7/29 Cefepime 7/27 >> 8/1 Cefazolin 8/1 >>  Interim  history/subjective:  Net even. Had another run of Vtach/vfib overnight. Sedate but following commands.  Objective   Blood pressure 110/82, pulse 60, temperature 98.8 F (37.1 C), resp. rate 18, height 5\' 9"  (1.753 m), weight 97.1 kg, SpO2 99 %. CVP:  [12 mmHg-16 mmHg] 16 mmHg  Vent Mode: PRVC FiO2 (%):  [40 %] 40 % Set Rate:  [18 bmp] 18 bmp Vt Set:  [560 mL] 560 mL PEEP:  [5 cmH20] 5 cmH20 Plateau Pressure:  [15 cmH20-24 cmH20] 18 cmH20   Intake/Output Summary (Last 24 hours) at 08/04/2019 0737 Last data filed at 08/04/2019 0700 Gross per 24 hour  Intake 3931.87 ml  Output 3470 ml  Net 461.87 ml   Filed Weights   08/02/19 0615 08/03/19 0317 08/04/19 0500  Weight: (!) 95.1 kg (!) 95.6 kg 97.1 kg    Examination: GEN: no acute distress on vent HEENT: ETT in place, minimal secretions CV: LVAD hum, ext warm PULM: rhonci bilaterally, triggering vent GI: protuberant, hypoactive BS EXT: diffuse anasarca NEURO: moves all 4 ext to command PSYCH: RASS -1 SKIN: No rashes  SvO2 76% K low BUN/Cr up CBC stable CBG okay Net neutral  Resolved Hospital Problem list   Shock   Assessment & Plan:   Acute Hypoxic Respiratory Failure in setting of Recurrent VF Klebsiella Pneumoniae PNA Sepsis due to UTI, Klebsiella Pneumoniae PNA -PRVC 8cc/kg -VAP prevention measures  -Would not consider extubation until we can control Vfib without IABP and we get better volume control -cefazolin, complete 7 days total (8/3 end date)  Sedation Needs for Mechanical Ventilation  -PAD protocol with versed, fentanyl gtt  for RASS Goal -1 to -2  Recurrent VFib requiring repeated DCCV -Cardiology / EP following  -continue lidocaine, amiodarone, digoxin -optimize electrolytes, goal K>4, Mg>2  Acute systolic heart failure s/p LVAD placement (06/25/19) -continue supportive measures   -diuretic, inotrope, IABP management per primary  Coagulopathy, on warfarin PTA -heparin per pharmacy, goal level  0.2-0.4  At Risk Malnutrition in setting of Critical Illness -continue TF   Best practice:  Diet: TF  Pain/Anxiety/Delirium protocol (if indicated): Fentanyl + Versed gtt VAP protocol (if indicated): yes DVT prophylaxis: heparin per pharmacy  GI prophylaxis: protonix  Glucose control: SSI Mobility: BR Code Status: Full  Family Communication: Per primary Disposition: ICU     The patient is critically ill with multiple organ systems failure and requires high complexity decision making for assessment and support, frequent evaluation and titration of therapies, application of advanced monitoring technologies and extensive interpretation of multiple databases. Critical Care Time devoted to patient care services described in this note independent of APP/resident time (if applicable)  is 31 minutes.   Erskine Emery MD [facility] 08/04/2019 7:37 AM Personal pager: [phone] If unanswered, please page CCM On-call: [phone]

## 2019-08-04 NOTE — Progress Notes (Signed)
  Echocardiogram 2D Echocardiogram has been performed.  Jennette Dubin 08/04/2019, 2:21 PM

## 2019-08-04 NOTE — Progress Notes (Signed)
[place] for Heparin Indication: LVAD>IABP  Allergies  Allergen Reactions  . Codeine Hives    Patient Measurements: Height: 5\' 9"  (175.3 cm) Weight: 97.1 kg (214 lb 1.1 oz) (blanket, sheet, LVAD controller) IBW/kg (Calculated) : 70.7 Heparin Dosing Weight: ~ 92 kg  Vital Signs: Temp: 98.6 F (37 C) (08/03 1100) Temp Source: Bladder (08/03 0800) BP: 100/89 (08/03 0800) Pulse Rate: 175 (08/03 1100)  Labs: Recent Labs    08/02/19 0421 08/02/19 0421 08/02/19 1546 08/02/19 1546 08/03/19 0107 08/03/19 0257 08/03/19 0257 08/03/19 0912 08/03/19 1130 08/03/19 1602 08/04/19 0432  HGB 8.7*   < > 8.8*   < >  --  8.4*   < >  --  9.5*  --  8.5*  HCT 28.3*   < > 27.9*   < >  --  27.5*  --   --  28.0*  --  28.2*  PLT 152   < > 152  --   --  159  --   --   --   --  179  LABPROT 17.5*  --   --   --   --  16.1*  --   --   --   --  15.6*  INR 1.5*  --   --   --   --  1.3*  --   --   --   --  1.3*  HEPARINUNFRC <0.10*   < > <0.10*   < > 0.11*  --   --  0.27*  --   --  0.18*  CREATININE 1.18   < > 1.36*   < >  --  1.47*  --   --   --  1.55* 1.71*   < > = values in this interval not displayed.    Estimated Creatinine Clearance: 56.1 mL/min (A) (by C-G formula based on SCr of 1.71 mg/dL (H)).  Assessment: 54 yo male s/p LVAD HM3 placement 6/24, followed by IABP 7/8-7/13. 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  Heparin level just barely below goal range this AM (0.18).  Urine with pink-tinge, Hgb trending down.  Goal of Therapy:  Heparin level goal 0.2-0.5 Monitor platelets by anticoagulation protocol: Yes   Plan:  Continue IV heparin at current rate of 1150 units/hr.  Will not increase given worsening Scr, pink-tinged urine and possible removal of IABP this afternoon. Daily heparin level.  [place] Crane, Roylene Reason, [facility] Clinical Pharmacist  08/04/2019 11:25  AM   [facility] pharmacy phone numbers are listed on [email or web address]

## 2019-08-04 NOTE — Progress Notes (Addendum)
Progress Note  Patient Name: Aaron [name] Date of Encounter: 08/04/2019  [facility] Cardiologist: Dr. Marlou Porch  Subjective   Intubated and sedated  Inpatient Medications    Scheduled Meds: . alteplase  2 mg Intracatheter Once  . aspirin  81 mg Per Tube Daily  . atorvastatin  80 mg Per Tube Daily  . bisacodyl  10 mg Rectal Once  . chlorhexidine gluconate (MEDLINE KIT)  15 mL Mouth Rinse BID  . Chlorhexidine Gluconate Cloth  6 each Topical Daily  . clonazePAM  0.5 mg Per Tube BID  . digoxin  0.125 mg Per Tube Daily  . docusate  100 mg Per Tube BID  . feeding supplement (PROSource TF)  90 mL Per Tube TID  . folic acid  1 mg Per Tube Daily  . free water  20 mL Per Tube Q4H  . furosemide  80 mg Intravenous BID  . gabapentin  300 mg Per Tube Q8H  . mouth rinse  15 mL Mouth Rinse 10 times per day  . midodrine  5 mg Oral TID WC  . multivitamin  15 mL Per Tube Daily  . pantoprazole sodium  40 mg Per Tube Daily  . polyethylene glycol  17 g Oral Daily  . potassium chloride  40 mEq Oral Q6H  . sildenafil  20 mg Per Tube TID  . sodium chloride flush  10-40 mL Intracatheter Q12H  . sorbitol  30 mL Per Tube Q6H  . thiamine  100 mg Per Tube Daily   Continuous Infusions: . sodium chloride    . sodium chloride Stopped (08/04/19 0923)  . sodium chloride 20 mL/hr at 08/01/19 1700  . amiodarone 30 mg/hr (08/04/19 0700)  .  ceFAZolin (ANCEF) IV Stopped (08/04/19 0551)  . feeding supplement (VITAL AF 1.2 CAL) 1,000 mL (08/04/19 0413)  . fentaNYL infusion INTRAVENOUS 200 mcg/hr (08/04/19 0700)  . heparin 1,150 Units/hr (08/04/19 0700)  . lidocaine 1 mg/min (08/04/19 0700)  . midazolam 2 mg/hr (08/04/19 0700)  . phenylephrine (NEO-SYNEPHRINE) Adult infusion 10 mcg/min (08/04/19 0700)  . vasopressin Stopped (07/02/2019 0051)   PRN Meds: Place/Maintain arterial line **AND** sodium chloride, sodium chloride, acetaminophen, fentaNYL, midazolam, sodium chloride flush   Vital Signs      Vitals:   08/04/19 0630 08/04/19 0645 08/04/19 0700 08/04/19 0734  BP:    110/82  Pulse: (!) 32  (!) 32 60  Resp: _0 Temp: 99 F (37.2 C) 99 F (37.2 C) 98.8 F (37.1 C)   TempSrc:      SpO2: 98%  99%   Weight:      Height:        Intake/Output Summary (Last 24 hours) at 08/04/2019 0735 Last data filed at 08/04/2019 0700 Gross per 24 hour  Intake 3931.87 ml  Output 3470 ml  Net 461.87 ml   Last 3 Weights 08/04/2019 08/03/2019 08/02/2019  Weight (lbs) 214 lb 1.1 oz 210 lb 12.2 oz 209 lb 10.5 oz  Weight (kg) 97.1 kg 95.6 kg 95.1 kg      Telemetry    SB 50's generally some 60's1st degree AVblock - Personally Reviewed  ECG    No new EKGs - Personally Reviewed  Physical Exam   Exam is unchanged GEN: sedated Neck: No JVD Cardiac: VAD hum.  Respiratory: CTA b/l, intubated GI: Soft, non-distended  MS:  generalized edema; No deformity. Neuro:  unable to assess Psych: unable to assess   Labs    High Sensitivity  Troponin:   Recent Labs  Lab 07/09/19 0740 07/09/19 0920  TROPONINIHS 3,337* 3,481*      Chemistry Recent Labs  Lab 08/01/19 0322 08/01/19 1440 08/03/19 0257 08/03/19 0257 08/03/19 1130 08/03/19 1602 08/04/19 0432  NA 128*   < > 132*   < > 133* 132* 133*  K 4.2   < > 3.2*   < > 3.4* 3.6 3.8  CL 100   < > 100  --   --  100 99  CO2 19*   < > 21*  --   --  20* 19*  GLUCOSE 131*   < > 119*  --   --  130* 116*  BUN 26*   < > 56*  --   --  65* 72*  CREATININE 0.93   < > 1.47*  --   --  1.55* 1.71*  CALCIUM 8.1*   < > 8.0*  --   --  7.9* 8.0*  PROT 6.0*  --   --   --   --   --   --   ALBUMIN 2.2*  --   --   --   --   --   --   AST 60*  --   --   --   --   --   --   ALT 71*  --   --   --   --   --   --   ALKPHOS 157*  --   --   --   --   --   --   BILITOT 1.0  --   --   --   --   --   --   GFRNONAA >60   < > 53*  --   --  50* 44*  GFRAA >60   < > >60  --   --  58* 51*  ANIONGAP 9   < > 11  --   --  12 15   < > = values in this interval not  displayed.     Hematology Recent Labs  Lab 08/02/19 1546 08/02/19 1546 08/03/19 0257 08/03/19 1130 08/04/19 0432  WBC 9.7  --  9.0  --  8.0  RBC 3.00*  --  2.98*  --  3.04*  HGB 8.8*   < > 8.4* 9.5* 8.5*  HCT 27.9*   < > 27.5* 28.0* 28.2*  MCV 93.0  --  92.3  --  92.8  MCH 29.3  --  28.2  --  28.0  MCHC 31.5  --  30.5  --  30.1  RDW 17.1*  --  17.3*  --  17.3*  PLT 152  --  159  --  179   < > = values in this interval not displayed.    BNPNo results for input(s): BNP, PROBNP in the last 168 hours.   DDimer No results for input(s): DDIMER in the last 168 hours.   Radiology    Portable Chest xray Result Date: 07/06/2019 CLINICAL DATA:  Hypoxia EXAM: PORTABLE CHEST 1 VIEW COMPARISON:  July 30, 2019. FINDINGS: Endotracheal tube tip is 3.8 cm above the carina. Nasogastric tube tip and side port are below the diaphragm. Central catheter tip is in the superior vena cava near the cavoatrial junction. There is a left ventricular assist device present. No pneumothorax. Small left pleural effusion with left and right base atelectatic change remain. No new opacity evident. Stable cardiomegaly with pulmonary vascularity normal. No adenopathy. Status post  median sternotomy. IMPRESSION: Tube and catheter positions as described without pneumothorax. Stable cardiomegaly with left ventricular assist device present. Stable small left pleural effusion with bibasilar atelectasis. No new opacity evident. Electronically Signed   By: Lowella Grip III M.D.   On: 07/03/2019 07:03     Cardiac Studies     07/09/2019: TTE IMPRESSIONS   1. Left ventricular ejection fraction, by estimation, is <20%. The left  ventricle has severely decreased function. The left ventricle demonstrates  global hypokinesis. The left ventricular internal cavity size was mildly  dilated. There is mild left  ventricular hypertrophy.   2. Right ventricular systolic function is mildly reduced. The right  ventricular size is  normal.   3. The mitral valve is normal in structure. Trivial mitral valve  regurgitation. No evidence of mitral stenosis.   4. The aortic valve is poorly visualized but does not appear to open.   5. Ramp echo done with speed increased from 5000 rpm to 5100 rpm.     06/17/2019: R/LHC  Mid LM lesion is 75% stenosed.  Ost LAD lesion is 100% stenosed.  Ost Cx to Prox Cx lesion is 100% stenosed.  Ost RCA lesion is 100% stenosed.  Hemodynamic findings consistent with severe pulmonary hypertension.  Intra-aortic balloon pump was placed to help with hemodynamic stability  Intervention was deferred unable to cross with a wire ostial LAD  Intervention was otherwise deferred in favor of bypass surgery   Conclusion Evidence of moderate to severe pulmonary hypertension Mean wedge of 43 mean PA of 53 Severe dilated cardiomyopathy probably ischemic ejection fraction less than 25% Moderate calcification of the proximal coronary arteries Multiple CTO's ostial LAD proximal circumflex ostial RCA Mild to moderate collaterals right to left to the LAD Minimal right to right collaterals Minimal left to left collaterals Coronaries 75% mid to distal left main 100% ostial LAD probable CTO 100% proximal circumflex probable CTO 100% ostial RCA probable CTO Collaterals as described Intervention was deferred after unable to cross with the wire for what was thought to be suspected CTO's of the ostial LAD Intra-aortic balloon pump was placed right femoral artery to help with hemodynamic stability 7 [place] Swan was placed on left and the PA position to help with hemodynamic monitoring Patient was placed on IV heparin for anticoagulation Arrangements were made for transfer to [facility] for possible ICU CCU care and possible evaluation for coronary bypass surgery Dr. Nechama Guard accepted the patient in transfer to CCU:    Patient Profile     54 y.o. 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  presented 06/18/19 with acute anterolateral STEMI.  He underwent emergent cath and was found to have multivessel CAD.  PCI of the LAD was unsuccessful (there were no targets for CABG).  He required IABP and subsequent VAD for cardiogenic shock. - HM3 placement 6/24  - Had 2 episodes of VF requiring emergent DC-CV on 07/02/19 - Recurrent VF on 7/2 and broke spontaneously.  - 7/3 Recurrent VF at 2a.-> emergent DC-CV.  - 07/07/2019 VT/Torsades -> repeat DC_CV - 07/08/2019 VF/Torsades x2--> DC-CV x2.  - 07/09/19 developed sepsis/PNA -> intubated - 07/09/19 Multiple episodes of VT/VF -> IABP placed. AV pacing via EPW He did eventually improve, transitioned to PO AAD with amiodarone and mexiletine. IABP was removed, HR improved and EPW removed, ultimately mexiletine was stopped, amio continued and able to be discharged to [facility] 07/21/2019 Notes report not a transplant candidate  given smoker.   Developed VF in [place] and readmitted 07/02/2019 > emergent defibrillation started on amio gtt and mexiletine 07/15/2019 developed refractory VF requiring numerous shocks > amio gtt, lido gtt, intubated on pressors Subsequently pressors have been stopped Continued to have VF 07/03/2019 >  IABP and temp pacing  Over the weekend continued to have VF episodes, temp pacing stopped VF 08/02/19 night x1, defibrillated, not rebolused None yesterday  Assessment & Plan    1. Refractory VF      amio gtt is @ 60 >> 30 yesterday afternoon Lido gtt at @ 1 (lido level yesterday 3.0, today is pending) Back on Neo last night On versed/fentanyl  Temp pacing > off (wire remains with plans to pull today in d/w Dr. Aundra Dubin) IABP changed to 1:2  >>  1:3 this AM  K+ 3.8 and being replaced Mag 2.1  Unable to give Ranexa and mexiletine through tube   2. SB 50's, 1st degree AVblock     Plans to pull temp wire today     Remains on dig HR 50's-60's, d/w Dr. Aundra Dubin keep unless he develops  significant bradycardia   Plans today to possibly get IABP out and perhaps extubate if able keep amio/lidocaine gtts until extubated at least and consider > PO in coming days if able   Dr. Rayann Heman will see later today, not sure that EP has more to offer     For questions or updates, please contact [facility] Please consult [email or web address] for contact info under     Signed, Baldwin Jamaica, PA-C  08/04/2019, 7:35 AM     I have seen, examined the patient, and reviewed the above assessment and plan.  Changes to above are made where necessary.  On exam, ill appearing, intubated.  Arrhythmia is currently stable.  Very little additional options for this patient. I will continue to follow.  Co Sign: Thompson Grayer, MD 08/04/2019

## 2019-08-04 NOTE — Progress Notes (Signed)
CSW attempted to visit patient at bedside although intubated and sedated. No family present at the time of visit.CSW continues to follow.  Raquel Sarna, [place], [place]

## 2019-08-05 LAB — BASIC METABOLIC PANEL
Anion gap: 13 (ref 5–15)
Anion gap: 10 (ref 5–15)
Anion gap: 12 (ref 5–15)
BUN: 77 mg/dL — ABNORMAL HIGH (ref 6–20)
BUN: 88 mg/dL — ABNORMAL HIGH (ref 6–20)
BUN: 84 mg/dL — ABNORMAL HIGH (ref 6–20)
CO2: 20 mmol/L — ABNORMAL LOW (ref 22–32)
CO2: 24 mmol/L (ref 22–32)
CO2: 23 mmol/L (ref 22–32)
Calcium: 7.7 mg/dL — ABNORMAL LOW (ref 8.9–10.3)
Calcium: 8.1 mg/dL — ABNORMAL LOW (ref 8.9–10.3)
Calcium: 8.2 mg/dL — ABNORMAL LOW (ref 8.9–10.3)
Chloride: 103 mmol/L (ref 98–111)
Chloride: 101 mmol/L (ref 98–111)
Chloride: 101 mmol/L (ref 98–111)
Creatinine, Ser: 1.83 mg/dL — ABNORMAL HIGH (ref 0.61–1.24)
Creatinine, Ser: 1.91 mg/dL — ABNORMAL HIGH (ref 0.61–1.24)
Creatinine, Ser: 1.94 mg/dL — ABNORMAL HIGH (ref 0.61–1.24)
GFR calc Af Amer: 47 mL/min — ABNORMAL LOW (ref >60)
GFR calc Af Amer: 45 mL/min — ABNORMAL LOW (ref >60)
GFR calc Af Amer: 44 mL/min — ABNORMAL LOW (ref >60)
GFR calc non Af Amer: 41 mL/min — ABNORMAL LOW (ref >60)
GFR calc non Af Amer: 39 mL/min — ABNORMAL LOW (ref >60)
GFR calc non Af Amer: 38 mL/min — ABNORMAL LOW (ref >60)
Glucose, Bld: 168 mg/dL — ABNORMAL HIGH (ref 70–99)
Glucose, Bld: 151 mg/dL — ABNORMAL HIGH (ref 70–99)
Glucose, Bld: 133 mg/dL — ABNORMAL HIGH (ref 70–99)
Potassium: 3.6 mmol/L (ref 3.5–5.1)
Potassium: 5.2 mmol/L — ABNORMAL HIGH (ref 3.5–5.1)
Potassium: 4.7 mmol/L (ref 3.5–5.1)
Sodium: 136 mmol/L (ref 135–145)
Sodium: 135 mmol/L (ref 135–145)
Sodium: 136 mmol/L (ref 135–145)

## 2019-08-05 LAB — GLUCOSE, CAPILLARY
Glucose-Capillary: 101 mg/dL — ABNORMAL HIGH (ref 70–99)
Glucose-Capillary: 107 mg/dL — ABNORMAL HIGH (ref 70–99)
Glucose-Capillary: 151 mg/dL — ABNORMAL HIGH (ref 70–99)
Glucose-Capillary: 164 mg/dL — ABNORMAL HIGH (ref 70–99)
Glucose-Capillary: 176 mg/dL — ABNORMAL HIGH (ref 70–99)
Glucose-Capillary: 180 mg/dL — ABNORMAL HIGH (ref 70–99)

## 2019-08-05 LAB — COOXEMETRY PANEL
Carboxyhemoglobin: 1.5 % (ref 0.5–1.5)
Methemoglobin: 1.1 % (ref 0.0–1.5)
O2 Saturation: 78.9 %
Total hemoglobin: 12.7 g/dL (ref 12.0–16.0)

## 2019-08-05 LAB — CBC
HCT: 28.2 % — ABNORMAL LOW (ref 39.0–52.0)
Hemoglobin: 8.4 g/dL — ABNORMAL LOW (ref 13.0–17.0)
MCH: 28 pg (ref 26.0–34.0)
MCHC: 29.8 g/dL — ABNORMAL LOW (ref 30.0–36.0)
MCV: 94 fL (ref 80.0–100.0)
Platelets: 186 10*3/uL (ref 150–400)
RBC: 3 MIL/uL — ABNORMAL LOW (ref 4.22–5.81)
RDW: 17.7 % — ABNORMAL HIGH (ref 11.5–15.5)
WBC: 8.8 10*3/uL (ref 4.0–10.5)
nRBC: 0 % (ref 0.0–0.2)

## 2019-08-05 LAB — TRIGLYCERIDES: Triglycerides: 42 mg/dL (ref <150)

## 2019-08-05 LAB — MAGNESIUM: Magnesium: 2 mg/dL (ref 1.7–2.4)

## 2019-08-05 LAB — PHOSPHORUS
Phosphorus: 4.2 mg/dL (ref 2.5–4.6)
Phosphorus: 4.1 mg/dL (ref 2.5–4.6)

## 2019-08-05 LAB — LIDOCAINE LEVEL: Lidocaine Lvl: 1.9 ug/mL (ref 1.5–5.0)

## 2019-08-05 LAB — PROTIME-INR
INR: 1.3 — ABNORMAL HIGH (ref 0.8–1.2)
Prothrombin Time: 15.7 seconds — ABNORMAL HIGH (ref 11.4–15.2)

## 2019-08-05 LAB — HEPARIN LEVEL (UNFRACTIONATED): Heparin Unfractionated: 0.28 IU/mL — ABNORMAL LOW (ref 0.30–0.70)

## 2019-08-05 LAB — LACTATE DEHYDROGENASE: LDH: 243 U/L — ABNORMAL HIGH (ref 98–192)

## 2019-08-05 MED ORDER — PROPOFOL 1000 MG/100ML IV EMUL
5.0000 ug/kg/min | INTRAVENOUS | Status: DC
  Administered 2019-08-05: 10 ug/kg/min via INTRAVENOUS
  Administered 2019-08-06 (×2): 25 ug/kg/min via INTRAVENOUS
  Administered 2019-08-06: 10 ug/kg/min via INTRAVENOUS
  Administered 2019-08-07 – 2019-08-10 (×13): 25 ug/kg/min via INTRAVENOUS
  Administered 2019-08-10: 20 ug/kg/min via INTRAVENOUS
  Administered 2019-08-11 (×2): 10 ug/kg/min via INTRAVENOUS
  Administered 2019-08-12: 40 ug/kg/min via INTRAVENOUS
  Administered 2019-08-12: 30 ug/kg/min via INTRAVENOUS
  Filled 2019-08-05 (×3): qty 100
  Filled 2019-08-05: qty 200
  Filled 2019-08-05 (×2): qty 100
  Filled 2019-08-05: qty 300
  Filled 2019-08-05 (×4): qty 100
  Filled 2019-08-05: qty 200
  Filled 2019-08-05 (×5): qty 100
  Filled 2019-08-05: qty 200
  Filled 2019-08-05: qty 100

## 2019-08-05 MED ORDER — VITAL 1.5 CAL PO LIQD
1000.0000 mL | ORAL | Status: DC
  Administered 2019-08-05 – 2019-08-18 (×14): 1000 mL
  Filled 2019-08-05 (×25): qty 1000

## 2019-08-05 MED ORDER — METHYLNALTREXONE BROMIDE 12 MG/0.6ML ~~LOC~~ SOLN
12.0000 mg | Freq: Once | SUBCUTANEOUS | Status: DC
  Filled 2019-08-05: qty 0.6

## 2019-08-05 MED ORDER — SORBITOL 70 % SOLN
30.0000 mL | Freq: Four times a day (QID) | Status: AC
  Administered 2019-08-05: 30 mL
  Filled 2019-08-05: qty 30

## 2019-08-05 MED ORDER — PROSOURCE TF PO LIQD
90.0000 mL | Freq: Two times a day (BID) | ORAL | Status: DC
  Administered 2019-08-05 – 2019-08-19 (×29): 90 mL
  Filled 2019-08-05 (×29): qty 90

## 2019-08-05 MED ORDER — POTASSIUM CHLORIDE 20 MEQ/15ML (10%) PO SOLN
40.0000 meq | ORAL | Status: DC
  Administered 2019-08-05 – 2019-08-06 (×7): 40 meq
  Filled 2019-08-05 (×7): qty 30

## 2019-08-05 MED ORDER — METOLAZONE 2.5 MG PO TABS
2.5000 mg | ORAL_TABLET | Freq: Once | ORAL | Status: AC
  Administered 2019-08-05: 2.5 mg via ORAL
  Filled 2019-08-05: qty 1

## 2019-08-05 MED ORDER — MIDODRINE HCL 5 MG PO TABS
10.0000 mg | ORAL_TABLET | Freq: Three times a day (TID) | ORAL | Status: DC
  Administered 2019-08-05 – 2019-08-07 (×8): 10 mg via ORAL
  Filled 2019-08-05 (×6): qty 2

## 2019-08-05 MED ORDER — DEXMEDETOMIDINE HCL IN NACL 400 MCG/100ML IV SOLN
0.4000 ug/kg/h | INTRAVENOUS | Status: DC
  Administered 2019-08-05: 0.4 ug/kg/h via INTRAVENOUS
  Filled 2019-08-05: qty 100

## 2019-08-05 MED ORDER — METOLAZONE 2.5 MG PO TABS: 2.5000 mg | ORAL_TABLET | Freq: Once | ORAL | Status: AC

## 2019-08-05 MED ORDER — CLONAZEPAM 1 MG PO TABS
1.0000 mg | ORAL_TABLET | Freq: Two times a day (BID) | ORAL | Status: DC
  Administered 2019-08-05 – 2019-08-19 (×30): 1 mg
  Filled 2019-08-05 (×30): qty 1

## 2019-08-05 NOTE — Progress Notes (Signed)
ANTICOAGULATION CONSULT NOTE  Pharmacy Consult for Heparin Indication: LVAD>IABP  Allergies  Allergen Reactions   Codeine Hives    Patient Measurements: Height: 5\' 9"  (175.3 cm) Weight: 97.2 kg (214 lb 4.6 oz) IBW/kg (Calculated) : 70.7 Heparin Dosing Weight: ~ 92 kg  Vital Signs: Temp: 100 F (37.8 C) (08/04 1500) Temp Source: Core (Comment) (08/04 1400) BP: 107/51 (08/04 1500) Pulse Rate: 63 (08/04 1500)  Labs: Recent Labs    08/03/19 0107 08/03/19 0257 08/03/19 0912 08/03/19 1130 08/04/19 0432 08/04/19 0432 08/04/19 1219 08/04/19 1221 08/04/19 1743 08/05/19 0441  HGB  --  8.4*  --    < > 8.5*   < > 9.5*  --   --  8.4*  HCT  --  27.5*  --    < > 28.2*  --  28.0*  --   --  28.2*  PLT  --  159  --   --  179  --   --   --   --  186  LABPROT  --  16.1*  --   --  15.6*  --   --   --   --  15.7*  INR  --  1.3*  --   --  1.3*  --   --   --   --  1.3*  HEPARINUNFRC   < >  --  0.27*  --  0.18*  --   --   --   --  0.28*  CREATININE  --  1.47*  --    < > 1.71*   < >  --  1.74* 1.83* 1.83*   < > = values in this interval not displayed.    Estimated Creatinine Clearance: 52.4 mL/min (A) (by C-G formula based on SCr of 1.83 mg/dL (H)).  Assessment: 54 yo male s/p LVAD HM3 placement 6/24, followed by IABP 7/8-7/13. 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  Heparin level 0.28 at goal range this AM.   Urine with pink-tinge yesterday cleared today, Hgb stable   Goal of Therapy:  Heparin level goal 0.2-0.5 Monitor platelets by anticoagulation protocol: Yes   Plan:  Continue IV heparin at current rate of 1150 units/hr.  Daily heparin level.  Bonnita Nasuti Pharm.D. CPP, BCPS Clinical Pharmacist [phone] 08/05/2019 3:10 PM      [facility] pharmacy phone numbers are listed on [email or web address]

## 2019-08-05 NOTE — Progress Notes (Signed)
NAME:  Aaron [name], MRN:  [id number], DOB:  August 23, 1965, LOS: 8 ADMISSION DATE:  07/26/2019, CONSULTATION DATE:  07/30/19 REFERRING MD:  Heart Failure, CHIEF COMPLAINT:  Respiratory failure -- VF, LVAD   Brief History   54 yo PMH acute systolic heart failure s/p LVAD (06/25/19), recurrent VT/VF/Torsades, multivessel CAD, R renal mass c/f RCC, suspected OSA, who presents back to ICU after prolonged hospital course after which he was transferred to [facility] 7/23. At CIR, pt developed dyspnea, hypoxia following PT 7/27. ECG obtained and reveals VF rate 210 bpm. Transferred to CVICU 7/27 for emergent DCCV. Intubated by anesthesia. DCCV performed numerous times 7/27, 7/28. Remains intubated requiring frequent cardioversion.  PCCM consulted 7/29 for vent management   Past Medical History  Acute systolic heart failure s/p LVAD CAD Recurrent VF, VT, torsades  Renal mass Debility Sleep apnea   Significant Hospital Events   6/16 admitted with acute anterolateral STEMI, cardiogenic shock requiring IABP 6/25 LVAD placement 7/03 VT/VT/Torsades requiring multiple emergent DCCV  7/06 recurrent VT/VT/Torsades requiring multiple emergent DCCV  7/07 recurrent VT/VT/Torsades requiring multiple emergent DCCV  7/08 PNA, respiratory failure, required intubation 7/13 IABP removed, extubated 7/23 discharred to CIR 7/27 recurrent VF, admit to CVICU for DCCV. Intubated 7/28 recurrent VF, DCCV multiple times 7/29 PCCM consult for vent  7/30 vt overnight defib x2 7/31 DCCV x2, improved on versed / no further shocks  Consults:  EP PCCM  Procedures:  ETT 7/27 >> Art line 7/28 >> RUE PICC 6/28 >>  R Femoral Venous Sheath 7/30 >>  IABP 7/30 >>   Significant Diagnostic Tests:    Micro Data:  7/28 MRSA > neg 7/28 BCx >>  7/28 UCx >>   7/29 tracheal aspirate >> few klesiella pneumoniae >> R-ampicillin, S-cefazolin  Antimicrobials:  Vanc 7/27 >> 7/29 Cefepime 7/27 >> 8/1 Cefazolin 8/1 >>  Interim  history/subjective:  Vfib again yesterday requiring shocks. VAD speed cut back, IABP left in place.  Objective   Blood pressure (!) 105/52, pulse (!) 30, temperature 98.4 F (36.9 C), resp. rate (!) 22, height 5\' 9"  (1.753 m), weight 97.2 kg, SpO2 100 %. CVP:  [12 mmHg-15 mmHg] 13 mmHg  Vent Mode: PRVC FiO2 (%):  [40 %-60 %] 60 % Set Rate:  [18 bmp] 18 bmp Vt Set:  [560 mL] 560 mL PEEP:  [5 cmH20] 5 cmH20 Plateau Pressure:  [17 cmH20-20 cmH20] 17 cmH20   Intake/Output Summary (Last 24 hours) at 08/05/2019 3710 Last data filed at 08/05/2019 0700 Gross per 24 hour  Intake 3814.91 ml  Output 4765 ml  Net -950.09 ml   Filed Weights   08/03/19 0317 08/04/19 0500 08/05/19 0600  Weight: (!) 95.6 kg 97.1 kg 97.2 kg    Examination: GEN: no acute distress on vent HEENT: ETT in place, minimal secretions CV: LVAD hum, ext warm PULM: rhonci bilaterally, triggering vent GI: protuberant, hypoactive BS EXT: diffuse anasarca NEURO: moves all 4 ext to command PSYCH: RASS -1 SKIN: No rashes  SvO2 78% K low: being repleted BUN/Cr stable CBC stable CBG okay -900cc yesterday  Resolved Hospital Problem list   Shock  Sepsis due to UTI, Klebsiella Pneumoniae PNA-cefazolin, complete 7 days total (8/3 end date)  Assessment & Plan:   Acute Hypoxic Respiratory Failure in setting of Recurrent VF, volume overloaded state of heart -PRVC 8cc/kg -VAP prevention measures  -Would not consider extubation until we can control Vfib without IABP and we get better volume control  Sedation Needs for Mechanical Ventilation  -  PAD protocol with fentanyl gtt for RASS Goal -1 to -2 -8/4 Switch versed to precedex  Recurrent VFib requiring repeated DCCV -Cardiology / EP following  -continue lidocaine, amiodarone, digoxin -optimize electrolytes, goal K>4, Mg>2  Acute systolic heart failure s/p LVAD placement (06/25/19) -continue supportive measures   -diuretic, inotrope, IABP management per  primary  Coagulopathy, on warfarin PTA -heparin per pharmacy, goal level 0.2-0.4  At Risk Malnutrition in setting of Critical Illness -continue TF   Constipation-KUB benign - On sorbitol, add relistor and enema today  Best practice:  Diet: TF  Pain/Anxiety/Delirium protocol (if indicated): Fentanyl + precedex gtt, PO klonipin VAP protocol (if indicated): yes DVT prophylaxis: heparin per pharmacy  GI prophylaxis: protonix  Glucose control: SSI Mobility: BR Code Status: Full  Family Communication: Per primary Disposition: ICU     The patient is critically ill with multiple organ systems failure and requires high complexity decision making for assessment and support, frequent evaluation and titration of therapies, application of advanced monitoring technologies and extensive interpretation of multiple databases. Critical Care Time devoted to patient care services described in this note independent of APP/resident time (if applicable)  is 33 minutes.   Erskine Emery MD [facility] 08/05/2019 8:12 AM Personal pager: [phone] If unanswered, please page CCM On-call: [phone]

## 2019-08-05 NOTE — Significant Event (Signed)
Wasted approximately 20cc of versed into steri-cycle waste bin with another RN Judeen Hammans as witness.

## 2019-08-05 NOTE — Progress Notes (Signed)
LVAD Coordinator Rounding Note:  HM III LVAD implanted on 06/25/19 by Dr. Orvan Seen under Destination Therapy criteria due to recent smoking history.  Pt's transferred to CIR on 07/24/19. Had refractory VF 07/30/2019 and transferred back to [facility].   Pt remains intubated, sedated. IABP on 1:3 this morning. BS nurse reports attempted switching from versed to precedex per order. Pt became restless requiring increased Fentanyl dose with low BP response. Precedex dc'd.  VAD Coordinator called sister with patient update per Dr. Aundra Dubin informing her of above occurrences; questions answered, support offered.   Vital signs: Temp: 99.3 HR: 47 Art. Line:  85/56 (69) Doppler:  70 O2 Sat: 98% on 60% FiO2 5 CPAP Wt: 189.6>196.4>200.8>209>209.6>210.7>214.1>214.2 lbs  LVAD parameters: Speed:  5300 Flow: 4.4 Power:  3.6w PI: 4.0 Hct: 28  Alarms: none Events: > 120 PI events today  Fixed speed: 5300 Low speed limit: 5000    Drive Line:  Existing dressing clean, dry, intact. Anchor intact and accurately applied. Continue q 3 day dressing changes per bedside nurse. Next dressing change due: 08/06/19.   Labs:  LDH trend: 326>250>256>358>297>309>266>243  INR trend: 2.1>3.5>2.2>1.5>1.5>1.3>1.3>1.3  Anticoagulation Plan: -INR Goal:  2.0 - 2.5 -ASA Dose: 81 mg - will keep going for CAD for 3 mths stop date 09/25/19  Device: N/A  IABP placed: 07/04/19  Drips: - Amiodarone 30 mg/hr - Fentanyl 175 mcg/ml - Lidocaine 1 mg/min - Versed 2 mg/hr - Lasix 12 mg/hr - Heparin 1150 mg/hr - Neo 20 mcg/min - Tube feed 50 ml/hr   Infection: - 07/28/2019 - BCs>>negative - 07/07/2019 - urine culture>>negative - 07/30/19 - respiratory culture>>Klebsiella pneumoniae - 08/04/19 - BCs>>pending  Arrythmias:  - 07/20/2019 Vfib while in CIR; emergently transferred back to 2H>> DCCV back to NSR; Amio gtt - 07/23/2019 recurrent VF>>increased amio gtt; Lido bolus/gtt; DC-CV 200J to NSR - 07/10/2019 recurrent VF>>8 separate DC-CV  throughout night; required pressors for BP support and re-intubation - 07/25/2019 recurrent VT/VF>>7 separate DC-CV throughout am; IABP and pacer urgently placed in Cath Lab - 07/27/2019 recurrent VT/VF>>2 additional DC-CV after IABP placment - 08/01/19 recurrent VT/VF>> pt had 3 episodes of VT/VR with 4 DC-CV - 08/02/19 recurrent VT/VF>>one DC-CV to SR - 08/04/19 recurrent VT/VF>>5 DC-CV to SR  Respiratory:  - intubated 07/05/2019    Plan/Recommendations:  1. Call VAD pager if any VAD equipment or drive line issues. 2. Every three day dressing changes per bedside nurse.  Zada Girt RN [place] Coordinator  Office: [phone]  24/7 Pager: [phone]

## 2019-08-05 NOTE — Significant Event (Addendum)
At approximately 1430, patient went into V-tach, sustaining, HR in the 140-150s, after being very agitated, kept reaching to pull ETT. Patient no longer following safety commands at this time like he was earlier; fighting against the ventilator support. Dr. Aundra Dubin at bedside and gave instructions to give versed 2mg  boluses X 2 and fentanyl 68mcg bolus X 1. Patient settled down after receiving sedation. Remained in V-tach; shocked X 1 by Dr. Aundra Dubin. Rhythm converted back to sinus in the 60-70s. Patient's sister is updated at bedside when she came to visit. VS/IABP/VAD numbers are stable.

## 2019-08-05 NOTE — Progress Notes (Signed)
Nutrition Follow-up  DOCUMENTATION CODES:   Severe malnutrition in context of acute illness/injury  INTERVENTION:   Tube Feeding via OG: Vital 1.5 at 65 ml/hr Pro-Source TF 90 mL BID Provides 2500 kcals, 149 g of protein, 1186 mL of free water Meets 100% estimated calorie and protein needs  NUTRITION DIAGNOSIS:   Severe Malnutrition related to acute illness (CHF with HM3 implantation) as evidenced by moderate fat depletion, moderate muscle depletion, energy intake < or equal to 50% for > or equal to 5 days.  Being addressed via TF   GOAL:   Patient will meet greater than or equal to 90% of their needs  Met  MONITOR:   Diet advancement, Vent status, Skin, TF tolerance, Weight trends, Labs, I & O's  REASON FOR ASSESSMENT:   Ventilator    ASSESSMENT:   Patient with PMH significant for MI, CAD, HTN, ischemic cardiomyopathy, CHF Recently admitted with acute STEMI, required multiple DCCV, IABP, and subsequent VAD for cardiogenic shock (HM3 placed 6/24). Presents from rehab with VF and readmitted to ICU.  7/27 Intubated 7/30 IABP placed  Pt continues with episodes of V.fib, requiring shocks Pt remains on vent support  Tolerating Vital AF 1.2 at 50 ml/hr, ProSource 90 mL TID via OG tube  Current weight 97.2 kg; admit weight 86 kg. Net +4.5 L  Skin assessment reviewed, no pressure injuries.  Labs: CBGs 107-180, Creatinine 1.83, BUN 77 Meds: colace, lasix drip, liquid MVI,KCl, thiamine  Diet Order:   Diet Order            Diet NPO time specified  Diet effective now                 EDUCATION NEEDS:   Not appropriate for education at this time  Skin:  Skin Assessment: Skin Integrity Issues: Skin Integrity Issues:: Incisions Incisions: chest, abdomen  Last BM:  8/4 rectal tube  Height:   Ht Readings from Last 1 Encounters:  08/01/19 '5\' 9"'  (1.753 m)    Weight:   Wt Readings from Last 1 Encounters:  08/05/19 97.2 kg    BMI:  Body mass index is  31.64 kg/m.  Estimated Nutritional Needs:   Kcal:  2150-2580 kcal  Protein:  130-172 grams  Fluid:  >/= 2.1 L/day   Kerman Passey MS, RDN, LDN, CNSC Registered Dietitian III Clinical Nutrition RD Pager and On-Call Pager Number Located in [place]

## 2019-08-05 NOTE — Progress Notes (Addendum)
Patient ID: Aaron [name], male   DOB: [birth year], 54 y.o.   MRN: [id number]   Advanced Heart Failure VAD Team Note  PCP-Cardiologist: No primary care provider on file.   Subjective:    - Had refractory VF throughout the night 7/28 w/ ~10 shocks. Now intubated.  - Developed bradycardia and further VF 7/30 am, TTVP + IABP placed 7/30 am.   - 8/1 completed cefepime, switched to ancef. Had VF 08/02/19 shock x1  - 8/3 Echo ramp, increased speed to 5400. VF x3. Failed IABP wean. Speed decreased to 5300.    Blood CX repeated 08/04/19   No VF over night. Remains on Amiodarone at 60  mg/hr, lidocaine at 1.  Lidocaine level 2.1  on 8/3   IABP 1:2, MAPs 70s CO-OX 79%   Remains on lasix drip. CVP down to 12 with I/Os mildly negative.  Creatinine stable 1.8.    No BM since 07/27/19. KUB ok on 8.3  Awake on vent.   LVAD INTERROGATION:  HeartMate 3 LVAD:   Flow 4.3 liters/min, speed 5300, power 4 PI 5.5   >100s PI events.   Objective:    Vital Signs:   Temp:  [98.4 F (36.9 C)-99.3 F (37.4 C)] 98.4 F (36.9 C) (08/04 0600) Pulse Rate:  [25-148] 30 (08/04 0600) Resp:  [14-24] 22 (08/04 0600) BP: (100-157)/(47-121) 105/52 (08/04 0400) SpO2:  [93 %-100 %] 100 % (08/04 0600) Arterial Line BP: (82-124)/(45-63) 114/59 (08/04 0600) FiO2 (%):  [40 %-60 %] 60 % (08/04 0351) Weight:  [97.2 kg] 97.2 kg (08/04 0600) Last BM Date: 07/27/19 Mean arterial Pressure 70s   Intake/Output:   Intake/Output Summary (Last 24 hours) at 08/05/2019 0710 Last data filed at 08/05/2019 0600 Gross per 24 hour  Intake 3778.26 ml  Output 4625 ml  Net -846.74 ml     Physical Exam  CVP 12  Physical Exam: GENERAL: Intubated HEENT: ETT NECK: Supple, JVP 11-12   Carotid  2+ bilaterally, no bruits.  No lymphadenopathy or thyromegaly appreciated.   CARDIAC:  Mechanical heart sounds with LVAD hum present.  LUNGS:  Coarse throughout.  ABDOMEN:  distended, round, nontender, positive bowel sounds x4.     LVAD exit  site: well-healed and incorporated.  Dressing dry and intact.  No erythema or drainage.  EXTREMITIES:  Warm and dry, no cyanosis, clubbing, rash or edema . RUE PICC R groin IABP.  NEUROLOGIC:  On vent. Follows commands. MAE      Telemetry   NSR 60s   Labs   Basic Metabolic Panel: Recent Labs  Lab  0000 07/09/2019 1742 07/30/19 0437 07/30/19 1047 07/30/19 1235 07/30/19 1730 07/22/2019 0315 07/30/2019 0322 08/03/19 0257 08/03/19 1130 08/03/19 1602 08/03/19 1602 08/04/19 0432 08/04/19 0432 08/04/19 1219 08/04/19 1221 08/04/19 1655 08/04/19 1743 08/05/19 0441  NA  --   --  127*   < >   < >  --  127*   < > 132*   < > 132*   < > 133*  --  135 133*  --  135 136  K  --   --  3.1*   < >   < >  --  3.5   < > 3.2*   < > 3.6   < > 3.8  --  5.5* 4.2  --  3.6 3.6  CL  --   --  95*   < >   < >  --  97*   < > 100  --  100  --  99  --   --  102  --  100 103  CO2  --   --  21*   < >   < >  --  19*   < > 21*  --  20*  --  19*  --   --  21*  --  20* 20*  GLUCOSE  --   --  138*   < >   < >  --  138*   < > 119*  --  130*  --  116*  --   --  147*  --  162* 168*  BUN  --   --  21*   < >   < >  --  28*   < > 56*  --  65*  --  72*  --   --  77*  --  77* 77*  CREATININE  --   --  0.95   < >   < >  --  1.20   < > 1.47*  --  1.55*  --  1.71*  --   --  1.74*  --  1.83* 1.83*  CALCIUM  --   --  8.3*   < >   < >  --  8.0*   < > 8.0*  --  7.9*   < > 8.0*   < >  --  8.1*  --  8.3* 7.7*  MG   < >  --  1.9  --   --   --  2.0   < > 2.1  --   --   --  2.1  --   --   --  2.2 2.4 2.0  PHOS  --  4.5 3.6  --   --  3.4 3.7  --   --   --   --   --   --   --   --   --   --   --   --    < > = values in this interval not displayed.    Liver Function Tests: Recent Labs  Lab 08/01/19 0322  AST 60*  ALT 71*  ALKPHOS 157*  BILITOT 1.0  PROT 6.0*  ALBUMIN 2.2*   No results for input(s): LIPASE, AMYLASE in the last 168 hours. No results for input(s): AMMONIA in the last 168 hours.  CBC: Recent Labs  Lab  08/02/19 0421 08/02/19 0421 08/02/19 1546 08/02/19 1546 08/03/19 0257 08/03/19 1130 08/04/19 0432 08/04/19 1219 08/05/19 0441  WBC 9.6  --  9.7  --  9.0  --  8.0  --  8.8  HGB 8.7*   < > 8.8*   < > 8.4* 9.5* 8.5* 9.5* 8.4*  HCT 28.3*   < > 27.9*   < > 27.5* 28.0* 28.2* 28.0* 28.2*  MCV 95.0  --  93.0  --  92.3  --  92.8  --  94.0  PLT 152  --  152  --  159  --  179  --  186   < > = values in this interval not displayed.    INR: Recent Labs  Lab 08/01/19 0322 08/02/19 0421 08/03/19 0257 08/04/19 0432 08/05/19 0441  INR 1.5* 1.5* 1.3* 1.3* 1.3*    Other results:    Imaging   DG Abd 1 View  Result Date: 08/04/2019 CLINICAL DATA:  Change in feeding tube. EXAM: ABDOMEN - 1 VIEW COMPARISON:  July 29, 2019 FINDINGS:  Multiple sternal wires are seen. Moderate to marked severity bibasilar infiltrates are noted. A nasogastric tube is seen with its distal tip overlying the body of the stomach. Overlying radiopaque lead wires are present. The bowel gas pattern is normal. No radio-opaque calculi or other significant radiographic abnormality are seen. IMPRESSION: 1. Nasogastric tube positioning, as described above. 2. No evidence of bowel obstruction. Electronically Signed   By: Virgina Norfolk M.D.   On: 08/04/2019 15:30   DG CHEST PORT 1 VIEW  Result Date: 08/04/2019 CLINICAL DATA:  Intubation. EXAM: PORTABLE CHEST 1 VIEW COMPARISON:  08/03/2019. FINDINGS: Endotracheal tube, NG tube, right PICC line in stable position. Balloon pump tip in stable position. Prior median sternotomy. Left ventricular assist device in stable position. Stable prominent cardiomegaly. Diffuse prominent pulmonary infiltrates/edema again noted at interim change. No pleural effusion or pneumothorax. IMPRESSION: 1.  Lines and tubes in stable position. 2. Stable prominent cardiomegaly. Left ventricular assist device in stable position. 3. Diffuse bilateral pulmonary infiltrates/edema again noted without interim  change. Electronically Signed   By: Marcello Moores  Register   On: 08/04/2019 06:38   ECHOCARDIOGRAM COMPLETE  Result Date: 08/04/2019    ECHOCARDIOGRAM REPORT   Patient Name:   Aaron [name] Date of Exam: 08/04/2019 Medical Rec #:  [id number]       Height:       69.0 in Accession #:    [id number]      Weight:       214.1 lb Date of Birth:  12-11-1965       BSA:          2.127 m Patient Age:    32 years        BP:           121/101 mmHg Patient Gender: M               HR:           65 bpm. Exam Location:  Inpatient Procedure: 2D Echo Indications:    CHF-Acute Systolic J69.67; Ramp echo for LVAD--complete echo                 after ramp.  History:        Patient has prior history of Echocardiogram examinations, most                 recent 07/09/2019. Cardiomyopathy, CAD and Previous Myocardial                 Infarction; Risk Factors:Hypertension and Current Smoker.  Sonographer:    Mikki Santee RDCS (AE) Referring Phys: Between  1. The left ventricular cavity appears decompressed and the septum is midline. LVAD cannula shadowing prevents evaluation of the apical wall motion. Left ventricular ejection fraction, by estimation, is 25 to 30%. The left ventricle has severely decreased function. The left ventricle demonstrates regional wall motion abnormalities (see scoring diagram/findings for description). There is moderate concentric left ventricular hypertrophy. Left ventricular diastolic parameters are consistent with Grade II diastolic dysfunction (pseudonormalization). Elevated left atrial pressure.  2. Right ventricular systolic function is normal. The right ventricular size is normal.  3. Left atrial size was mildly dilated.  4. Right atrial size was mildly dilated.  5. The mitral valve is normal in structure. Mild mitral valve regurgitation.  6. The aortic valve opens briefly at all LVAD speed settings, up to 5400 rpm. The aortic valve is tricuspid. Aortic valve regurgitation is not  visualized. No aortic stenosis  is present.  7. Aortic dilatation noted. There is mild dilatation of the aortic root measuring 43 mm. FINDINGS  Left Ventricle: The left ventricular cavity appears decompressed and the septum is midline. LVAD cannula shadowing prevents evaluation of the apical wall motion. Left ventricular ejection fraction, by estimation, is 25 to 30%. The left ventricle has severely decreased function. The left ventricle demonstrates regional wall motion abnormalities. The left ventricular internal cavity size was normal in size. There is moderate concentric left ventricular hypertrophy. Abnormal (paradoxical) septal motion, consistent with left bundle branch block. Left ventricular diastolic function could not be evaluated due to nondiagnostic images. Left ventricular diastolic parameters are consistent with Grade II diastolic dysfunction (pseudonormalization). Elevated left atrial pressure.  LV Wall Scoring: The anterior wall and anterior septum are akinetic. The antero-lateral wall, inferior wall, posterior wall, mid inferoseptal segment, and basal inferoseptal segment are hypokinetic. Right Ventricle: The right ventricular size is normal. No increase in right ventricular wall thickness. Right ventricular systolic function is normal. Left Atrium: Left atrial size was mildly dilated. Right Atrium: Right atrial size was mildly dilated. Pericardium: Trivial pericardial effusion is present. Mitral Valve: The mitral valve is normal in structure. Mild mitral valve regurgitation. Tricuspid Valve: The tricuspid valve is normal in structure. Tricuspid valve regurgitation is not demonstrated. Aortic Valve: The aortic valve opens briefly at all LVAD speed settings, up to 5400 rpm. The aortic valve is tricuspid. Aortic valve regurgitation is not visualized. No aortic stenosis is present. Pulmonic Valve: The pulmonic valve was not well visualized. Pulmonic valve regurgitation is not visualized. Aorta: Aortic  dilatation noted. There is mild dilatation of the aortic root measuring 43 mm. Venous: The inferior vena cava was not well visualized. IAS/Shunts: No atrial level shunt detected by color flow Doppler.  LEFT VENTRICLE PLAX 2D LVIDd:         4.98 cm  Diastology LVIDs:         4.70 cm  LV e' lateral:   6.22 cm/s LV PW:         1.35 cm  LV E/e' lateral: 15.9 LV IVS:        1.45 cm LVOT diam:     2.90 cm LVOT Area:     6.61 cm  RIGHT VENTRICLE TAPSE (M-mode): 0.8 cm LEFT ATRIUM              Index       RIGHT ATRIUM           Index LA diam:        5.55 cm  2.61 cm/m  RA Area:     22.70 cm LA Vol (A2C):   98.5 ml  46.32 ml/m RA Volume:   64.60 ml  30.38 ml/m LA Vol (A4C):   112.0 ml 52.67 ml/m LA Biplane Vol: 116.0 ml 54.55 ml/m   AORTA Ao Root diam: 4.30 cm MITRAL VALVE MV Area (PHT): 4.10 cm    SHUNTS MV Decel Time: 185 msec    Systemic Diam: 2.90 cm MV E velocity: 99.20 cm/s MV A velocity: 56.30 cm/s MV E/A ratio:  1.76 Mihai Croitoru MD Electronically signed by Sanda Klein MD Signature Date/Time: 08/04/2019/3:04:41 PM    Final      Medications:     Scheduled Medications: . alteplase  2 mg Intracatheter Once  . aspirin  81 mg Per Tube Daily  . atorvastatin  80 mg Per Tube Daily  . bisacodyl  10 mg Rectal Once  . chlorhexidine gluconate (MEDLINE KIT)  15 mL Mouth Rinse BID  . Chlorhexidine Gluconate Cloth  6 each Topical Daily  . clonazePAM  0.5 mg Per Tube BID  . docusate  100 mg Per Tube BID  . feeding supplement (PROSource TF)  90 mL Per Tube TID  . folic acid  1 mg Per Tube Daily  . free water  20 mL Per Tube Q4H  . gabapentin  300 mg Per Tube Q8H  . mouth rinse  15 mL Mouth Rinse 10 times per day  . midodrine  5 mg Oral TID WC  . multivitamin  15 mL Per Tube Daily  . pantoprazole sodium  40 mg Per Tube Daily  . polyethylene glycol  17 g Oral Daily  . potassium chloride  40 mEq Oral Q6H  . sildenafil  20 mg Per Tube TID  . sodium chloride flush  10-40 mL Intracatheter Q12H  .  thiamine  100 mg Per Tube Daily    Infusions: . sodium chloride    . sodium chloride 10 mL/hr at 08/05/19 0600  . sodium chloride 20 mL/hr at 08/01/19 1700  . amiodarone 60 mg/hr (08/05/19 0600)  .  ceFAZolin (ANCEF) IV 2 g (08/05/19 8101)  . feeding supplement (VITAL AF 1.2 CAL) 1,000 mL (08/05/19 0424)  . fentaNYL infusion INTRAVENOUS 200 mcg/hr (08/05/19 0600)  . furosemide (LASIX) infusion 10 mg/hr (08/05/19 0600)  . heparin 1,150 Units/hr (08/05/19 0600)  . lidocaine 1 mg/min (08/05/19 0600)  . midazolam 2 mg/hr (08/05/19 0600)  . phenylephrine (NEO-SYNEPHRINE) Adult infusion Stopped (08/04/19 0734)  . vasopressin Stopped (07/22/2019 0051)    PRN Medications: Place/Maintain arterial line **AND** sodium chloride, sodium chloride, acetaminophen, fentaNYL, midazolam, sodium chloride flush   Assessment/Plan:    1. Refractory Ventricular Fibrillation  -multiple episodes ~10 of VF on 7/28 - multiple episodes 7/30 am => IABP placed as well as TTVP given relative bradycardia.  Continued to have VF 7/30 , 7/31, 8/1, 8/3  - Ranexa and Mexiletine currently on hold (unable to crush and administer through tube). Plan to resume Ranexa 500 mg bid + Mexiletine 200 mg bid once extubated  - Continue lidocaine gtt at 1, level 2.1 on 8/3. Check lidocaine level now.  - Continue amiodarone gtt at 60  mg/hr.   - Sotalol not a good option given his history of bradycardia.  - Off dig.  - Keep K > 4.0 and Mg > 2.0. - Bedside echo 7/28 cannula ok.  MAP improved post-IABP, wonder if we may actually be able to increase LVAD speed eventually to improve output and decrease drive for VT.  For now, will continue IABP with no LVAD speed changes.    2. Acute Systolic Heart Failure s/p VAD - EF 75%, RV systolic function mildly reduced - HMIII LVAD placed 06/25/19.  - RV looks good on echo, cannula ok  - Co-ox 79% - CVP down to 12 today. Continue lasix drip at 10 mg per hour.  - avoid inotropes with  VF - Off dig with bradycardia.  - Heparin gtt with IABP, off warfarin.  -Try to wean  IABP to 1:3  - Creatinine unchanged 1.8 today.    3. Acute hypoxic resp failure - intubated in setting of VF - vent management per PCCM   4. CAD - S/p Acute STEMI 6/21.Emergent cath showed severe multivessel disease w/ 75% mid- distal LM, 100% prox LAD, not amendable to PCI (unable to cross w/ guide wire, probable CTO), 100% prox LCx (probable CTO) and 100% ostial  RCA (probable CTO). There were mildto moderate collaterals right to left to the LAD, minimal right to right collateralsan minimalleft to left collaterals.LVEF 25% -Hs trop peaked 27,000. - No targets for CABG / PCI.  - S/P HMIII VAD - Continue ASA + statin   5. Renal mass = RCC - MRI abdomen- 1.8 cm right renal lesion, most consistent with renal cell carcinoma. No evidence of renal vein involvement or nodal metastasis. - Discussed w/ Urology and R RCC is small and will just require surveillance. Ok to proceed with VAD from their standpoint.   6. Urinary retention - Foley placed - UA + for UTI. On abx  7. ID:  - ?HCAP => Klebsiella in trach aspirate from 7/29.  - Completed cefepime 08/02/19, switched to ancef.  - Blood CX repeated 08/04/19  8. Anemia  -Hgb 8.4.  - No obvious source of bleeding.  - Repeat CBC in am. Transfuse Hgb < 7.5   9. AKI -Creatinine has been trending up. Unchanged from yesterday at 1.8.   10. Constipation Last BM 7/26. KUB ok.  Try relistor today.     Darrick Grinder, NP  7:10 AM  08/05/2019  Patient seen with NP, agree with the above note.   Co-ox 79% today with CVP 12.  He diuresed yesterday some with I/Os mildly negative. BUN/creatinine unchanged.  Abx transitioned to Ancef (Klebsiella in sputum culture sensitive).    Dropped IABP to 1:3 yesterday, had VF and increased back to 1:2.  Ramp echo yesterday with speed up to 5400, had VF x 2, decreased speed down to 5300 rpm. No further VT overnight.    MAP around 70 on midodrine 5 mg tid.    General: NAD on vent Neck: JVP 12 cm, no thyromegaly or thyroid nodule.  Lungs: Decreased at bases.  CV: Nondisplaced PMI.  Heart regular S1/S2, no S3/S4, no murmur. 1+ edema to knees.  N Abdomen: Soft, nontender, no hepatosplenomegaly, mild distention.  Skin: Intact without lesions or rashes.  Neurologic: Will wake up and follow commands.  Extremities: No clubbing or cyanosis.  HEENT: Normal.   Wean sedation this morning, increase midodrine to 10 mg tid.  Needs further diuresis, will increase Lasix to 12 mg/hr and give metolazone 2.5 x 1.  Replace K.    Good co-ox today with IABP 1:2.  Keep LVAD speed at 5300 rpm.  Will wean IABP to 1:3, if tolerates with stable MAP and no arrhythmias, can hold heparin later today and remove IABP.   Continue amiodarone at 60 and lidocaine at 1. Lidocaine level yesterday ok, pending today.   He is now on Ancef for sensitive Klebsiella in sputum.   Wean vent after IABP out.   CRITICAL CARE Performed by: Loralie Champagne  Total critical care time: 40 minutes  Critical care time was exclusive of separately billable procedures and treating other patients.  Critical care was necessary to treat or prevent imminent or life-threatening deterioration.  Critical care was time spent personally by me on the following activities: development of treatment plan with patient and/or surrogate as well as nursing, discussions with consultants, evaluation of patient's response to treatment, examination of patient, obtaining history from patient or surrogate, ordering and performing treatments and interventions, ordering and review of laboratory studies, ordering and review of radiographic studies, pulse oximetry and re-evaluation of patient's condition.  Loralie Champagne 08/05/2019 8:15 AM

## 2019-08-05 NOTE — Progress Notes (Addendum)
Progress Note  Patient Name: Aaron [name] Date of Encounter: 08/05/2019  [facility] Cardiologist: Dr. Marlou Porch  Subjective   Intubated and sedated  Inpatient Medications    Scheduled Meds: . alteplase  2 mg Intracatheter Once  . aspirin  81 mg Per Tube Daily  . atorvastatin  80 mg Per Tube Daily  . bisacodyl  10 mg Rectal Once  . chlorhexidine gluconate (MEDLINE KIT)  15 mL Mouth Rinse BID  . Chlorhexidine Gluconate Cloth  6 each Topical Daily  . clonazePAM  1 mg Per Tube BID  . docusate  100 mg Per Tube BID  . feeding supplement (PROSource TF)  90 mL Per Tube TID  . folic acid  1 mg Per Tube Daily  . free water  20 mL Per Tube Q4H  . gabapentin  300 mg Per Tube Q8H  . mouth rinse  15 mL Mouth Rinse 10 times per day  . methylnaltrexone  12 mg Subcutaneous Once  . midodrine  10 mg Oral TID WC  . multivitamin  15 mL Per Tube Daily  . pantoprazole sodium  40 mg Per Tube Daily  . polyethylene glycol  17 g Oral Daily  . potassium chloride  40 mEq Oral Q6H  . sildenafil  20 mg Per Tube TID  . sodium chloride flush  10-40 mL Intracatheter Q12H  . sorbitol  30 mL Per Tube Q6H  . thiamine  100 mg Per Tube Daily   Continuous Infusions: . sodium chloride    . sodium chloride Stopped (08/05/19 2694)  . sodium chloride 20 mL/hr at 08/01/19 1700  . amiodarone 60 mg/hr (08/05/19 0700)  . dexmedetomidine (PRECEDEX) IV infusion    . feeding supplement (VITAL AF 1.2 CAL) 1,000 mL (08/05/19 0424)  . fentaNYL infusion INTRAVENOUS 200 mcg/hr (08/05/19 0700)  . furosemide (LASIX) infusion 12 mg/hr (08/05/19 0843)  . heparin 1,150 Units/hr (08/05/19 0700)  . lidocaine 1 mg/min (08/05/19 0700)  . midazolam 2 mg/hr (08/05/19 0713)  . phenylephrine (NEO-SYNEPHRINE) Adult infusion Stopped (08/04/19 0734)  . vasopressin Stopped (07/13/2019 0051)   PRN Meds: Place/Maintain arterial line **AND** sodium chloride, sodium chloride, acetaminophen, fentaNYL, midazolam, sodium chloride flush     Vital Signs    Vitals:   08/05/19 0400 08/05/19 0500 08/05/19 0600 08/05/19 0745  BP: (!) 105/52   (!) 116/47  Pulse: (!) 32 (!) 29 (!) 30 (!) 57  Resp: 19 19 (!) 22 (!) 22  Temp: 98.8 F (37.1 C) 98.8 F (37.1 C) 98.4 F (36.9 C)   TempSrc:      SpO2: 100% 100% 100% 100%  Weight:   97.2 kg   Height:        Intake/Output Summary (Last 24 hours) at 08/05/2019 0851 Last data filed at 08/05/2019 0700 Gross per 24 hour  Intake 3814.91 ml  Output 4765 ml  Net -950.09 ml   Last 3 Weights 08/05/2019 08/04/2019 08/03/2019  Weight (lbs) 214 lb 4.6 oz 214 lb 1.1 oz 210 lb 12.2 oz  Weight (kg) 97.2 kg 97.1 kg 95.6 kg      Telemetry    SB 50's generally some 60's1st degree AVblock - Personally Reviewed  ECG    SR 63 bpm, 1st degree AVBlock, baseline interferance - Personally Reviewed  Physical Exam   Exam remains unchanged GEN: sedated Neck: No JVD Cardiac: VAD hum.  Respiratory: CTA b/l, intubated GI: Soft, non-distended  MS:  generalized edema; No deformity. Neuro:  unable to assess Psych: unable  to assess   Labs    High Sensitivity Troponin:   Recent Labs  Lab 07/09/19 0740 07/09/19 0920  TROPONINIHS 3,337* 3,481*      Chemistry Recent Labs  Lab 08/01/19 0322 08/01/19 1440 08/04/19 1221 08/04/19 1743 08/05/19 0441  NA 128*   < > 133* 135 136  K 4.2   < > 4.2 3.6 3.6  CL 100   < > 102 100 103  CO2 19*   < > 21* 20* 20*  GLUCOSE 131*   < > 147* 162* 168*  BUN 26*   < > 77* 77* 77*  CREATININE 0.93   < > 1.74* 1.83* 1.83*  CALCIUM 8.1*   < > 8.1* 8.3* 7.7*  PROT 6.0*  --   --   --   --   ALBUMIN 2.2*  --   --   --   --   AST 60*  --   --   --   --   ALT 71*  --   --   --   --   ALKPHOS 157*  --   --   --   --   BILITOT 1.0  --   --   --   --   GFRNONAA >60   < > 43* 41* 41*  GFRAA >60   < > 50* 47* 47*  ANIONGAP 9   < > _0 < > = values in this interval not displayed.     Hematology Recent Labs  Lab 08/03/19 0257 08/03/19 1130  08/04/19 0432 08/04/19 1219 08/05/19 0441  WBC 9.0  --  8.0  --  8.8  RBC 2.98*  --  3.04*  --  3.00*  HGB 8.4*   < > 8.5* 9.5* 8.4*  HCT 27.5*   < > 28.2* 28.0* 28.2*  MCV 92.3  --  92.8  --  94.0  MCH 28.2  --  28.0  --  28.0  MCHC 30.5  --  30.1  --  29.8*  RDW 17.3*  --  17.3*  --  17.7*  PLT 159  --  179  --  186   < > = values in this interval not displayed.    BNPNo results for input(s): BNP, PROBNP in the last 168 hours.   DDimer No results for input(s): DDIMER in the last 168 hours.   Radiology    Portable Chest xray Result Date: 07/02/2019 CLINICAL DATA:  Hypoxia EXAM: PORTABLE CHEST 1 VIEW COMPARISON:  July 30, 2019. FINDINGS: Endotracheal tube tip is 3.8 cm above the carina. Nasogastric tube tip and side port are below the diaphragm. Central catheter tip is in the superior vena cava near the cavoatrial junction. There is a left ventricular assist device present. No pneumothorax. Small left pleural effusion with left and right base atelectatic change remain. No new opacity evident. Stable cardiomegaly with pulmonary vascularity normal. No adenopathy. Status post median sternotomy. IMPRESSION: Tube and catheter positions as described without pneumothorax. Stable cardiomegaly with left ventricular assist device present. Stable small left pleural effusion with bibasilar atelectasis. No new opacity evident. Electronically Signed   By: Lowella Grip III M.D.   On: 07/19/2019 07:03     Cardiac Studies     07/09/2019: TTE IMPRESSIONS   1. Left ventricular ejection fraction, by estimation, is <20%. The left  ventricle has severely decreased function. The left ventricle demonstrates  global hypokinesis. The left ventricular internal cavity size was mildly  dilated. There is mild left  ventricular hypertrophy.   2. Right ventricular systolic function is mildly reduced. The right  ventricular size is normal.   3. The mitral valve is normal in structure. Trivial mitral valve   regurgitation. No evidence of mitral stenosis.   4. The aortic valve is poorly visualized but does not appear to open.   5. Ramp echo done with speed increased from 5000 rpm to 5100 rpm.     06/17/2019: R/LHC  Mid LM lesion is 75% stenosed.  Ost LAD lesion is 100% stenosed.  Ost Cx to Prox Cx lesion is 100% stenosed.  Ost RCA lesion is 100% stenosed.  Hemodynamic findings consistent with severe pulmonary hypertension.  Intra-aortic balloon pump was placed to help with hemodynamic stability  Intervention was deferred unable to cross with a wire ostial LAD  Intervention was otherwise deferred in favor of bypass surgery   Conclusion Evidence of moderate to severe pulmonary hypertension Mean wedge of 43 mean PA of 53 Severe dilated cardiomyopathy probably ischemic ejection fraction less than 25% Moderate calcification of the proximal coronary arteries Multiple CTO's ostial LAD proximal circumflex ostial RCA Mild to moderate collaterals right to left to the LAD Minimal right to right collaterals Minimal left to left collaterals Coronaries 75% mid to distal left main 100% ostial LAD probable CTO 100% proximal circumflex probable CTO 100% ostial RCA probable CTO Collaterals as described Intervention was deferred after unable to cross with the wire for what was thought to be suspected CTO's of the ostial LAD Intra-aortic balloon pump was placed right femoral artery to help with hemodynamic stability 7 [place] Swan was placed on left and the PA position to help with hemodynamic monitoring Patient was placed on IV heparin for anticoagulation Arrangements were made for transfer to [facility] for possible ICU CCU care and possible evaluation for coronary bypass surgery Dr. Nechama Guard accepted the patient in transfer to CCU:    Patient Profile     54 y.o. 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  presented 06/18/19 with acute  anterolateral STEMI.  He underwent emergent cath and was found to have multivessel CAD.  PCI of the LAD was unsuccessful (there were no targets for CABG).  He required IABP and subsequent VAD for cardiogenic shock. - HM3 placement 6/24  - Had 2 episodes of VF requiring emergent DC-CV on 07/02/19 - Recurrent VF on 7/2 and broke spontaneously.  - 7/3 Recurrent VF at 2a.-> emergent DC-CV.  - 07/07/2019 VT/Torsades -> repeat DC_CV - 07/08/2019 VF/Torsades x2--> DC-CV x2.  - 07/09/19 developed sepsis/PNA -> intubated - 07/09/19 Multiple episodes of VT/VF -> IABP placed. AV pacing via EPW He did eventually improve, transitioned to PO AAD with amiodarone and mexiletine. IABP was removed, HR improved and EPW removed, ultimately mexiletine was stopped, amio continued and able to be discharged to [facility] 07/21/2019 Notes report not a transplant candidate given smoker.   Developed VF in [place] and readmitted 07/10/2019 > emergent defibrillation started on amio gtt and mexiletine 07/12/2019 developed refractory VF requiring numerous shocks > amio gtt, lido gtt, intubated on pressors Subsequently pressors have been stopped Continued to have VF 07/11/2019 >  IABP and temp pacing  Over the weekend continued to have VF episodes, temp pacing stopped VF 08/02/19 night x1, defibrillated, not rebolused VF 08/04/19 afternoon (defib x3), amio increased back to 60 and IABP back to 1:2  Assessment & Plan    1. Refractory  VF      amio gtt is @ 60 >> 30 >> back to 60 yesterday afternoon after having more VT Lido gtt at @ 1 (lido level yesterday 2.1, today is pending) Off pressor, on midodrine On versed/fentanyl  IABP changed to 1:2  >> 1:3 yesterday AM >> 1:2 after VF yesterday afternoon  K+ 3.6 and being replaced Mag 2.0  Unable to give Ranexa and mexiletine through tube   2. SB 50's, 1st degree AVblock     Dig stopped yesterday   IABP removal and extubate when able  keep amio/lidocaine gtts until extubated at least and  consider > PO in coming days if able, goal to get on PO amiodarone, ranexa and mexiletine    Dr. Rayann Heman has seen and examined the patient this AM     For questions or updates, please contact [facility] Please consult [email or web address] for contact info under     Signed, Baldwin [place], PA-C  08/05/2019, 8:51 AM    I have seen, examined the patient, and reviewed the above assessment and plan.  Changes to above are made where necessary.  On exam, very ill, intubated  Continues to have VF.  I spoken with Dr Aundra Dubin at length this am.  Unfortunately, we do not have additional options. Continue to follow.  Co Sign: Thompson Grayer, MD 08/05/2019 5:03 PM

## 2019-08-06 LAB — CBC
HCT: 29.3 % — ABNORMAL LOW (ref 39.0–52.0)
Hemoglobin: 8.8 g/dL — ABNORMAL LOW (ref 13.0–17.0)
MCH: 28.4 pg (ref 26.0–34.0)
MCHC: 30 g/dL (ref 30.0–36.0)
MCV: 94.5 fL (ref 80.0–100.0)
Platelets: 206 10*3/uL (ref 150–400)
RBC: 3.1 MIL/uL — ABNORMAL LOW (ref 4.22–5.81)
RDW: 17.9 % — ABNORMAL HIGH (ref 11.5–15.5)
WBC: 10.9 10*3/uL — ABNORMAL HIGH (ref 4.0–10.5)
nRBC: 0 % (ref 0.0–0.2)

## 2019-08-06 LAB — PROTIME-INR
INR: 1.2 (ref 0.8–1.2)
Prothrombin Time: 14.9 seconds (ref 11.4–15.2)

## 2019-08-06 LAB — GLUCOSE, CAPILLARY
Glucose-Capillary: 167 mg/dL — ABNORMAL HIGH (ref 70–99)
Glucose-Capillary: 178 mg/dL — ABNORMAL HIGH (ref 70–99)
Glucose-Capillary: 161 mg/dL — ABNORMAL HIGH (ref 70–99)
Glucose-Capillary: 141 mg/dL — ABNORMAL HIGH (ref 70–99)
Glucose-Capillary: 126 mg/dL — ABNORMAL HIGH (ref 70–99)
Glucose-Capillary: 152 mg/dL — ABNORMAL HIGH (ref 70–99)

## 2019-08-06 LAB — BASIC METABOLIC PANEL
Anion gap: 14 (ref 5–15)
BUN: 87 mg/dL — ABNORMAL HIGH (ref 6–20)
CO2: 21 mmol/L — ABNORMAL LOW (ref 22–32)
Calcium: 7.8 mg/dL — ABNORMAL LOW (ref 8.9–10.3)
Chloride: 102 mmol/L (ref 98–111)
Creatinine, Ser: 1.87 mg/dL — ABNORMAL HIGH (ref 0.61–1.24)
GFR calc Af Amer: 46 mL/min — ABNORMAL LOW (ref >60)
GFR calc non Af Amer: 40 mL/min — ABNORMAL LOW (ref >60)
Glucose, Bld: 172 mg/dL — ABNORMAL HIGH (ref 70–99)
Potassium: 4.4 mmol/L (ref 3.5–5.1)
Sodium: 137 mmol/L (ref 135–145)

## 2019-08-06 LAB — MAGNESIUM: Magnesium: 2.1 mg/dL (ref 1.7–2.4)

## 2019-08-06 LAB — COOXEMETRY PANEL
Carboxyhemoglobin: 1.6 % — ABNORMAL HIGH (ref 0.5–1.5)
Methemoglobin: 1.3 % (ref 0.0–1.5)
O2 Saturation: 79.5 %
Total hemoglobin: 9 g/dL — ABNORMAL LOW (ref 12.0–16.0)

## 2019-08-06 LAB — LACTATE DEHYDROGENASE: LDH: 235 U/L — ABNORMAL HIGH (ref 98–192)

## 2019-08-06 LAB — POCT ACTIVATED CLOTTING TIME: Activated Clotting Time: 147 seconds

## 2019-08-06 LAB — LIDOCAINE LEVEL
Lidocaine Lvl: 22.2 ug/mL — ABNORMAL HIGH (ref 1.5–5.0)
Lidocaine Lvl: 2.5 ug/mL (ref 1.5–5.0)

## 2019-08-06 LAB — HEPARIN LEVEL (UNFRACTIONATED): Heparin Unfractionated: 0.24 IU/mL — ABNORMAL LOW (ref 0.30–0.70)

## 2019-08-06 MED ORDER — POTASSIUM CHLORIDE 20 MEQ/15ML (10%) PO SOLN
40.0000 meq | Freq: Four times a day (QID) | ORAL | Status: DC
  Administered 2019-08-06 – 2019-08-07 (×2): 40 meq
  Filled 2019-08-06 (×2): qty 30

## 2019-08-06 MED ORDER — INSULIN ASPART 100 UNIT/ML ~~LOC~~ SOLN
0.0000 [IU] | SUBCUTANEOUS | Status: DC
  Administered 2019-08-06 (×2): 2 [IU] via SUBCUTANEOUS
  Administered 2019-08-06 – 2019-08-07 (×2): 3 [IU] via SUBCUTANEOUS
  Administered 2019-08-07: 2 [IU] via SUBCUTANEOUS
  Administered 2019-08-07: 3 [IU] via SUBCUTANEOUS
  Administered 2019-08-07 – 2019-08-08 (×4): 2 [IU] via SUBCUTANEOUS
  Administered 2019-08-09: 3 [IU] via SUBCUTANEOUS
  Administered 2019-08-09 (×3): 2 [IU] via SUBCUTANEOUS
  Administered 2019-08-09: 3 [IU] via SUBCUTANEOUS
  Administered 2019-08-10 – 2019-08-11 (×7): 2 [IU] via SUBCUTANEOUS
  Administered 2019-08-11: 3 [IU] via SUBCUTANEOUS
  Administered 2019-08-11 – 2019-08-18 (×26): 2 [IU] via SUBCUTANEOUS
  Administered 2019-08-19: 3 [IU] via SUBCUTANEOUS
  Administered 2019-08-19 – 2019-08-20 (×6): 2 [IU] via SUBCUTANEOUS

## 2019-08-06 MED ORDER — HEPARIN (PORCINE) 25000 UT/250ML-% IV SOLN
1150.0000 [IU]/h | INTRAVENOUS | Status: AC
  Administered 2019-08-08 – 2019-08-11 (×3): 1150 [IU]/h via INTRAVENOUS
  Filled 2019-08-06 (×6): qty 250

## 2019-08-06 MED ORDER — METOLAZONE 2.5 MG PO TABS
2.5000 mg | ORAL_TABLET | Freq: Once | ORAL | Status: AC
  Administered 2019-08-06: 2.5 mg via ORAL
  Filled 2019-08-06: qty 1

## 2019-08-06 MED ORDER — AMIODARONE LOAD VIA INFUSION
150.0000 mg | Freq: Once | INTRAVENOUS | Status: AC
  Administered 2019-08-06: 150 mg via INTRAVENOUS
  Filled 2019-08-06: qty 83.34

## 2019-08-06 NOTE — Progress Notes (Addendum)
LVAD Coordinator Rounding Note:  HM III LVAD implanted on 06/25/19 by Dr. Orvan Seen under Destination Therapy criteria due to recent smoking history.  Pt's transferred to CIR on 07/24/19. Had refractory VF 07/10/2019 and transferred back to [place].   Pt remains intubated, sedated. IABP on 1:2 overnight after VT/VF episode yesterday afternoon. Decreased to 1:3 this am per Dr. Aundra Dubin. Plan to dc IABP if possible today.   IABP dc'd 10:00 am. Vent weaned to pressure support 12:45. Pt went in to VT/VF at 13:25 requiring 3 shocks to convert to SR.   Vital signs: Temp: 99.3 HR: 58 Art. Line:  82/73 (80) Doppler:   O2 Sat: 100% on 50% FiO2 5 CPAP Wt: 189.6>196.4>200.8>209>209.6>210.7>214.1>214.2>211.8  lbs  LVAD parameters: Speed:  5300 Flow: 4.4 Power:  3.8w PI: 5.5 Hct: 29  Alarms: none Events: > 105 PI events today  Fixed speed: 5300 Low speed limit: 5000    Drive Line:  Existing dressing clean, dry, intact. Anchor intact and accurately applied. Existing VAD dressing removed and site care performed using sterile technique. Drive line exit site cleaned with sterile saline only, allowed to dry, and gauze dressing with NO SILVER STRIP re-applied. Exit site partially incorporated, the velour is fully implanted at exit site. Exit site remains red, no tenderness, drainage, foul odor or rash noted. Drive line anchor re-applied. Pt denies fever or chills.  Continue q 3 day dressing changes per bedside nurse. Next dressing change due: 08/09/19.   Labs:  LDH trend: 326>250>256>358>297>309>266>243>235  INR trend: 2.1>3.5>2.2>1.5>1.5>1.3>1.3>1.3>1.2  Anticoagulation Plan: -INR Goal:  2.0 - 2.5 -ASA Dose: 81 mg - will keep going for CAD for 3 mths stop date 09/25/19  Device: N/A  IABP: - placed 07/04/19 - dc'd 08/06/19  Drips: - Amiodarone 30 mg/hr - Fentanyl 250 mcg/ml - Lidocaine 1 mg/min - Versed 2 mg/hr - Lasix 12 mg/hr - Heparin 1150 mg/hr - Propofol 10 mcg/kg/min  - Tube feed 65  ml/hr   Infection: - 07/05/2019 - BCs>>negative - 07/17/2019 - urine culture>>negative - 07/30/19 - respiratory culture>>Klebsiella pneumoniae - 08/04/19 - BCs>>pending  Arrythmias:  - 07/15/2019 Vfib while in CIR; emergently transferred back to 2H>> DCCV back to NSR; Amio gtt - 07/17/2019 recurrent VF>>increased amio gtt; Lido bolus/gtt; DC-CV 200J to NSR - 07/06/2019 recurrent VF>>8 separate DC-CV throughout night; required pressors for BP support and re-intubation - 07/06/2019 recurrent VT/VF>>7 separate DC-CV throughout am; IABP and pacer urgently placed in Cath Lab - 07/21/2019 recurrent VT/VF>>2 additional DC-CV after IABP placment - 08/01/19 recurrent VT/VF>> pt had 3 episodes of VT/VR with 4 DC-CV - 08/02/19 recurrent VT/VF>>one DC-CV to SR - 08/04/19 recurrent VT/VF>>5 DC-CV to SR - 08/05/19 recurrent VT/VF>> one DC-CV to SR - 08/06/19 recurrent VT/VF>> episode required 3 DC-CV to SR  Respiratory:  - intubated 07/14/2019    Plan/Recommendations:  1. Call VAD pager if any VAD equipment or drive line issues. 2. Every three day dressing changes per bedside nurse.  Zada Girt RN [place] Coordinator  Office: [phone]  24/7 Pager: [phone]

## 2019-08-06 NOTE — Significant Event (Signed)
At 1325pm, patient went into V-tach, HR 150s, sustaining. During this time, patient was weaning from the ventilator on PS 10 for almost an hour now. After 45 minutes of weaning, patient would intermittently breath hard; belly-breathing with respiratory rate up to 30s. Patient would then settled back to breathing 14-16 times/minute when coached. Placed patient back on full ventilatory support. RRT at bedside and suctioned patient.   HF team called. Amy at bedside and gave verbal orders for 150mg  amiodarone bolus and defib at 200J. Patient received a total of 3 shocks before converting back to sinus in the 60s. Patient had already been sedated with propofol and fentanyl boluses and drips were increased prior to being shocked. Neo drip restarted for low BP.

## 2019-08-06 NOTE — Significant Event (Signed)
IABP removed at 10:58am, by cath lab RNs. VS stable during and post removal. Vascular checks done frequently; no bleeding noted.

## 2019-08-06 NOTE — Progress Notes (Addendum)
Patient ID: Aaron [name], male   DOB: January 12, 1965, 54 y.o.   MRN: [id number]   Advanced Heart Failure VAD Team Note  PCP-Cardiologist: No primary care provider on file.   Subjective:    - Had refractory VF throughout the night 7/28 w/ ~10 shocks. Now intubated.  - Developed bradycardia and further VF 7/30 am, TTVP + IABP placed 7/30 am.   - 8/1 completed cefepime, switched to ancef. Had VF 08/02/19 shock x1  - 8/3 Echo ramp, increased speed to 5400. VF x3. Failed IABP wean. Speed decreased to 5300.   - 08/05/19 VF shock x1  Blood CX repeated 08/04/19   No VF over night. Remains on Amiodarone at 20  mg/hr, lidocaine at 1.  Lidocaine level 1.9  on 8/4   IABP 1:2, MAPs 70s CO-OX 79%   Remains on lasix drip. CVP 10 .  Creatinine stable 1.8.    Sedated on vent   LVAD INTERROGATION:  HeartMate 3 LVAD:   Flow 4.3 liters/min, speed 5300, power 4 PI 4.5   Rare PI events this morning.    Objective:    Vital Signs:   Temp:  [98.2 F (36.8 C)-100 F (37.8 C)] 99.5 F (37.5 C) (08/05 0700) Pulse Rate:  [28-77] 31 (08/05 0700) Resp:  [13-27] 19 (08/05 0700) BP: (97-134)/(47-85) 123/58 (08/05 0211) SpO2:  [96 %-100 %] 99 % (08/05 0700) Arterial Line BP: (70-148)/(47-83) 104/50 (08/05 0700) FiO2 (%):  [50 %-60 %] 50 % (08/05 0211) Weight:  [96.1 kg] 96.1 kg (08/05 0552) Last BM Date: 08/05/19 Mean arterial Pressure 70s   Intake/Output:   Intake/Output Summary (Last 24 hours) at 08/06/2019 0715 Last data filed at 08/06/2019 0700 Gross per 24 hour  Intake 3779.17 ml  Output 4520 ml  Net -740.83 ml     Physical Exam  CVP 10  Physical Exam: GENERAL: Intubated  HEENT: ETT NECK: Supple, JVP 9-10  .  2+ bilaterally, no bruits.  No lymphadenopathy or thyromegaly appreciated.   CARDIAC:  Mechanical heart sounds with LVAD hum present.  LUNGS:  Clear to auscultation bilaterally.  ABDOMEN:  Soft, round, nontender, positive bowel sounds x4.     LVAD exit site: Dressing dry and intact.  No  erythema or drainage.  Stabilization device present and accurately applied.   EXTREMITIES:  Warm and dry, no cyanosis, clubbing, rash or edema . R groin IABP. RUE PICC NEUROLOGIC:  Sedated on vent   Telemetry   SR over night. No VT/VF over night  Labs   Basic Metabolic Panel: Recent Labs  Lab 07/30/19 1235 07/30/19 1730 07/21/2019 0315 07/18/2019 0322 08/04/19 0432 08/04/19 1219 08/04/19 1221 08/04/19 1655 08/04/19 1743 08/04/19 1743 08/05/19 0441 08/05/19 0441 08/05/19 1644 08/05/19 1749 08/06/19 0456  NA   < >  --  127*   < > 133*   < >   < >  --  135  --  136  --  135 136 137  K   < >  --  3.5   < > 3.8   < >   < >  --  3.6  --  3.6  --  5.2* 4.7 4.4  CL   < >  --  97*   < > 99   < >   < >  --  100  --  103  --  101 101 102  CO2   < >  --  19*   < > 19*   < >   < >  --  20*  --  20*  --  24 23 21*  GLUCOSE   < >  --  138*   < > 116*   < >   < >  --  162*  --  168*  --  151* 133* 172*  BUN   < >  --  28*   < > 72*   < >   < >  --  77*  --  77*  --  88* 84* 87*  CREATININE   < >  --  1.20   < > 1.71*   < >   < >  --  1.83*  --  1.83*  --  1.91* 1.94* 1.87*  CALCIUM   < >  --  8.0*   < > 8.0*   < >   < >  --  8.3*   < > 7.7*   < > 8.1* 8.2* 7.8*  MG  --   --  2.0   < > 2.1  --   --  2.2 2.4  --  2.0  --   --   --  2.1  PHOS  --  3.4 3.7  --   --   --   --   --   --   --   --   --  4.2 4.1  --    < > = values in this interval not displayed.    Liver Function Tests: Recent Labs  Lab 08/01/19 0322  AST 60*  ALT 71*  ALKPHOS 157*  BILITOT 1.0  PROT 6.0*  ALBUMIN 2.2*   No results for input(s): LIPASE, AMYLASE in the last 168 hours. No results for input(s): AMMONIA in the last 168 hours.  CBC: Recent Labs  Lab 08/02/19 1546 08/02/19 1546 08/03/19 0257 08/03/19 0257 08/03/19 1130 08/04/19 0432 08/04/19 1219 08/05/19 0441 08/06/19 0458  WBC 9.7  --  9.0  --   --  8.0  --  8.8 10.9*  HGB 8.8*   < > 8.4*   < > 9.5* 8.5* 9.5* 8.4* 8.8*  HCT 27.9*   < > 27.5*    < > 28.0* 28.2* 28.0* 28.2* 29.3*  MCV 93.0  --  92.3  --   --  92.8  --  94.0 94.5  PLT 152  --  159  --   --  179  --  186 206   < > = values in this interval not displayed.    INR: Recent Labs  Lab 08/02/19 0421 08/03/19 0257 08/04/19 0432 08/05/19 0441 08/06/19 0456  INR 1.5* 1.3* 1.3* 1.3* 1.2    Other results:    Imaging   DG Abd 1 View  Result Date: 08/04/2019 CLINICAL DATA:  Change in feeding tube. EXAM: ABDOMEN - 1 VIEW COMPARISON:  July 29, 2019 FINDINGS: Multiple sternal wires are seen. Moderate to marked severity bibasilar infiltrates are noted. A nasogastric tube is seen with its distal tip overlying the body of the stomach. Overlying radiopaque lead wires are present. The bowel gas pattern is normal. No radio-opaque calculi or other significant radiographic abnormality are seen. IMPRESSION: 1. Nasogastric tube positioning, as described above. 2. No evidence of bowel obstruction. Electronically Signed   By: Virgina Norfolk M.D.   On: 08/04/2019 15:30   ECHOCARDIOGRAM COMPLETE  Result Date: 08/04/2019    ECHOCARDIOGRAM REPORT   Patient Name:   Aaron [name] Date of Exam: 08/04/2019 Medical Rec #:  [id number]  Height:       69.0 in Accession #:    [id number]      Weight:       214.1 lb Date of Birth:  May 09, 1965       BSA:          2.127 m Patient Age:    78 years        BP:           121/101 mmHg Patient Gender: M               HR:           65 bpm. Exam Location:  Inpatient Procedure: 2D Echo Indications:    CHF-Acute Systolic H20.94; Ramp echo for LVAD--complete echo                 after ramp.  History:        Patient has prior history of Echocardiogram examinations, most                 recent 07/09/2019. Cardiomyopathy, CAD and Previous Myocardial                 Infarction; Risk Factors:Hypertension and Current Smoker.  Sonographer:    Mikki Santee RDCS (AE) Referring Phys: [place]  1. The left ventricular cavity appears decompressed and  the septum is midline. LVAD cannula shadowing prevents evaluation of the apical wall motion. Left ventricular ejection fraction, by estimation, is 25 to 30%. The left ventricle has severely decreased function. The left ventricle demonstrates regional wall motion abnormalities (see scoring diagram/findings for description). There is moderate concentric left ventricular hypertrophy. Left ventricular diastolic parameters are consistent with Grade II diastolic dysfunction (pseudonormalization). Elevated left atrial pressure.  2. Right ventricular systolic function is normal. The right ventricular size is normal.  3. Left atrial size was mildly dilated.  4. Right atrial size was mildly dilated.  5. The mitral valve is normal in structure. Mild mitral valve regurgitation.  6. The aortic valve opens briefly at all LVAD speed settings, up to 5400 rpm. The aortic valve is tricuspid. Aortic valve regurgitation is not visualized. No aortic stenosis is present.  7. Aortic dilatation noted. There is mild dilatation of the aortic root measuring 43 mm. FINDINGS  Left Ventricle: The left ventricular cavity appears decompressed and the septum is midline. LVAD cannula shadowing prevents evaluation of the apical wall motion. Left ventricular ejection fraction, by estimation, is 25 to 30%. The left ventricle has severely decreased function. The left ventricle demonstrates regional wall motion abnormalities. The left ventricular internal cavity size was normal in size. There is moderate concentric left ventricular hypertrophy. Abnormal (paradoxical) septal motion, consistent with left bundle branch block. Left ventricular diastolic function could not be evaluated due to nondiagnostic images. Left ventricular diastolic parameters are consistent with Grade II diastolic dysfunction (pseudonormalization). Elevated left atrial pressure.  LV Wall Scoring: The anterior wall and anterior septum are akinetic. The antero-lateral wall, inferior  wall, posterior wall, mid inferoseptal segment, and basal inferoseptal segment are hypokinetic. Right Ventricle: The right ventricular size is normal. No increase in right ventricular wall thickness. Right ventricular systolic function is normal. Left Atrium: Left atrial size was mildly dilated. Right Atrium: Right atrial size was mildly dilated. Pericardium: Trivial pericardial effusion is present. Mitral Valve: The mitral valve is normal in structure. Mild mitral valve regurgitation. Tricuspid Valve: The tricuspid valve is normal in structure. Tricuspid valve regurgitation is not demonstrated. Aortic Valve: The  aortic valve opens briefly at all LVAD speed settings, up to 5400 rpm. The aortic valve is tricuspid. Aortic valve regurgitation is not visualized. No aortic stenosis is present. Pulmonic Valve: The pulmonic valve was not well visualized. Pulmonic valve regurgitation is not visualized. Aorta: Aortic dilatation noted. There is mild dilatation of the aortic root measuring 43 mm. Venous: The inferior vena cava was not well visualized. IAS/Shunts: No atrial level shunt detected by color flow Doppler.  LEFT VENTRICLE PLAX 2D LVIDd:         4.98 cm  Diastology LVIDs:         4.70 cm  LV e' lateral:   6.22 cm/s LV PW:         1.35 cm  LV E/e' lateral: 15.9 LV IVS:        1.45 cm LVOT diam:     2.90 cm LVOT Area:     6.61 cm  RIGHT VENTRICLE TAPSE (M-mode): 0.8 cm LEFT ATRIUM              Index       RIGHT ATRIUM           Index LA diam:        5.55 cm  2.61 cm/m  RA Area:     22.70 cm LA Vol (A2C):   98.5 ml  46.32 ml/m RA Volume:   64.60 ml  30.38 ml/m LA Vol (A4C):   112.0 ml 52.67 ml/m LA Biplane Vol: 116.0 ml 54.55 ml/m   AORTA Ao Root diam: 4.30 cm MITRAL VALVE MV Area (PHT): 4.10 cm    SHUNTS MV Decel Time: 185 msec    Systemic Diam: 2.90 cm MV E velocity: 99.20 cm/s MV A velocity: 56.30 cm/s MV E/A ratio:  1.76 Mihai Croitoru MD Electronically signed by Sanda Klein MD Signature Date/Time:  08/04/2019/3:04:41 PM    Final      Medications:     Scheduled Medications: . alteplase  2 mg Intracatheter Once  . aspirin  81 mg Per Tube Daily  . atorvastatin  80 mg Per Tube Daily  . bisacodyl  10 mg Rectal Once  . chlorhexidine gluconate (MEDLINE KIT)  15 mL Mouth Rinse BID  . Chlorhexidine Gluconate Cloth  6 each Topical Daily  . clonazePAM  1 mg Per Tube BID  . docusate  100 mg Per Tube BID  . feeding supplement (PROSource TF)  90 mL Per Tube BID  . folic acid  1 mg Per Tube Daily  . free water  20 mL Per Tube Q4H  . gabapentin  300 mg Per Tube Q8H  . mouth rinse  15 mL Mouth Rinse 10 times per day  . midodrine  10 mg Oral TID WC  . multivitamin  15 mL Per Tube Daily  . pantoprazole sodium  40 mg Per Tube Daily  . polyethylene glycol  17 g Oral Daily  . potassium chloride  40 mEq Per Tube Q4H  . sildenafil  20 mg Per Tube TID  . sodium chloride flush  10-40 mL Intracatheter Q12H  . thiamine  100 mg Per Tube Daily    Infusions: . sodium chloride    . sodium chloride Stopped (08/05/19 1236)  . sodium chloride 20 mL/hr at 08/01/19 1700  . amiodarone 30 mg/hr (08/06/19 0700)  . feeding supplement (VITAL 1.5 CAL) 1,000 mL (08/05/19 1650)  . fentaNYL infusion INTRAVENOUS 250 mcg/hr (08/06/19 0700)  . furosemide (LASIX) infusion 12 mg/hr (08/06/19 0700)  .  heparin 1,150 Units/hr (08/06/19 0600)  . lidocaine 1 mg/min (08/06/19 0700)  . midazolam Stopped (08/05/19 1345)  . phenylephrine (NEO-SYNEPHRINE) Adult infusion Stopped (08/05/19 1253)  . propofol (DIPRIVAN) infusion 10 mcg/kg/min (08/06/19 0700)  . vasopressin Stopped (07/09/2019 0051)    PRN Medications: Place/Maintain arterial line **AND** sodium chloride, sodium chloride, acetaminophen, fentaNYL, midazolam, sodium chloride flush   Assessment/Plan:    1. Refractory Ventricular Fibrillation  -multiple episodes ~10 of VF on 7/28 - multiple episodes 7/30 am => IABP placed as well as TTVP given relative  bradycardia.  Continued to have VF 7/30 , 7/31, 8/1, 8/3, 8/4   - Ranexa and Mexiletine currently on hold (unable to crush and administer through tube). Plan to resume Ranexa 500 mg bid + Mexiletine 200 mg bid once extubated  - Continue lidocaine gtt at 1, level 1.9 on 8/4. Check lidocaine level now.  - Continue amiodarone gtt at 30  mg/hr.   - Sotalol not a good option given his history of bradycardia.  - Off dig.  - Keep K > 4.0 and Mg > 2.0. - Bedside echo 7/28 cannula ok.  MAP improved post-IABP, wonder if we may actually be able to increase LVAD speed eventually to improve output and decrease drive for VT.  For now, will continue IABP with no LVAD speed changes.   2. Acute Systolic Heart Failure s/p VAD - EF 79%, RV systolic function mildly reduced - HMIII LVAD placed 06/25/19.  - RV looks good on echo, cannula ok  - Co-ox 79% . Continue lasix drip at 12 mg per hour. CVP 10  - avoid inotropes with VF - Off dig with bradycardia.  - Heparin gtt with IABP, off warfarin.  -Wean IABP to 1:3. Hopefully we can remove later today.   Creatinine unchanged 1.8 today.    3. Acute hypoxic resp failure - intubated in setting of VF - vent management per PCCM   4. CAD - S/p Acute STEMI 6/21.Emergent cath showed severe multivessel disease w/ 75% mid- distal LM, 100% prox LAD, not amendable to PCI (unable to cross w/ guide wire, probable CTO), 100% prox LCx (probable CTO) and 100% ostial RCA (probable CTO). There were mildto moderate collaterals right to left to the LAD, minimal right to right collateralsan minimalleft to left collaterals.LVEF 25% -Hs trop peaked 27,000. - No targets for CABG / PCI.  - S/P HMIII VAD - Continue ASA + statin   5. Renal mass = RCC - MRI abdomen- 1.8 cm right renal lesion, most consistent with renal cell carcinoma. No evidence of renal vein involvement or nodal metastasis. - Discussed w/ Urology and R RCC is small and will just require surveillance. Ok  to proceed with VAD from their standpoint.   6. Urinary retention - Foley placed - UA + for UTI. On abx  7. ID:  - ?HCAP => Klebsiella in trach aspirate from 7/29.  - Completed cefepime 08/02/19, switched to ancef.  - Blood CX repeated 08/04/19  8. Anemia  -Hgb 8.8  - No obvious source of bleeding.  - Repeat CBC in am. Transfuse Hgb < 7.5   9. AKI -Creatinine has been trending up. Unchanged from yesterday at 1.8.   Darrick Grinder, NP  7:15 AM  08/06/2019  Patient seen with NP, agree with the above note.   Co-ox 80% today with CVP 10-11. I/Os negative yesterday with Lasix gtt @ 12 mg/hr + metolazone 2.5 x 1. BUN/creatinine unchanged. He continues on Ancef (Klebsiella in sputum culture  sensitive).   Dropped IABP to 1:3 yesterday, had VF and increased back to 1:2.  No further VT/VF overnight.  IABP decreased to 1:3 again today, tolerating so far.   MAP stable on midodrine 10 mg tid. Off pressors.   General: NAD on vent HEENT: Normal. Neck: Supple, JVP 12 cm. Carotids OK.  Cardiac:  Mechanical heart sounds with LVAD hum present.  Lungs:  CTAB, normal effort.  Abdomen:  NT, ND, no HSM. No bruits or masses. +BS  LVAD exit site: Well-healed and incorporated. Dressing dry and intact. No erythema or drainage. Stabilization device present and accurately applied. Driveline dressing changed daily per sterile technique. Extremities:  Warm and dry. No cyanosis, clubbing, rash. 1+ ankle edema.   Neuro:  Sedated but will awaken and follow commands.   Volume status improving, CVP to 10.  Continue Lasix 12 mg/hr and will give a dose of metolazone 2.5 x 1.     Good co-ox today with IABP 1:2. Keep LVAD speed at 5300 rpm, LVAD parameters stable.  IABP weaned again to 1:3.  Stop heparin now, pull IABP when ACT < 150.  Keep him sedated during this process to avoid triggering VT.    Continue amiodarone at 30 and lidocaine at 1. Lidocaine level yesterday ok, pending today.   He is now on  Ancef for sensitive Klebsiella in sputum.   Wean vent after IABP out.Discussed with Dr. Tamala Julian.   CRITICAL CARE Performed by: Loralie Champagne  Total critical care time: 40 minutes  Critical care time was exclusive of separately billable procedures and treating other patients.  Critical care was necessary to treat or prevent imminent or life-threatening deterioration.  Critical care was time spent personally by me on the following activities: development of treatment plan with patient and/or surrogate as well as nursing, discussions with consultants, evaluation of patient's response to treatment, examination of patient, obtaining history from patient or surrogate, ordering and performing treatments and interventions, ordering and review of laboratory studies, ordering and review of radiographic studies, pulse oximetry and re-evaluation of patient's condition.  Loralie Champagne 08/06/2019 9:38 AM

## 2019-08-06 NOTE — Progress Notes (Signed)
NAME:  Aaron [name], MRN:  [id number], DOB:  [birth year], LOS: 9 ADMISSION DATE:  07/22/2019, CONSULTATION DATE:  07/30/19 REFERRING MD:  Heart Failure, CHIEF COMPLAINT:  Respiratory failure -- VF, LVAD   Brief History   54 yo PMH acute systolic heart failure s/p LVAD (06/25/19), recurrent VT/VF/Torsades, multivessel CAD, R renal mass c/f RCC, suspected OSA, who presents back to ICU after prolonged hospital course after which he was transferred to [facility] 7/23. At CIR, pt developed dyspnea, hypoxia following PT 7/27. ECG obtained and reveals VF rate 210 bpm. Transferred to CVICU 7/27 for emergent DCCV. Intubated by anesthesia. DCCV performed numerous times 7/27, 7/28. Remains intubated requiring frequent cardioversion.  PCCM consulted 7/29 for vent management   Past Medical History  Acute systolic heart failure s/p LVAD CAD Recurrent VF, VT, torsades  Renal mass Debility Sleep apnea   Significant Hospital Events   6/16 admitted with acute anterolateral STEMI, cardiogenic shock requiring IABP 6/25 LVAD placement 7/03 VT/VT/Torsades requiring multiple emergent DCCV  7/06 recurrent VT/VT/Torsades requiring multiple emergent DCCV  7/07 recurrent VT/VT/Torsades requiring multiple emergent DCCV  7/08 PNA, respiratory failure, required intubation 7/13 IABP removed, extubated 7/23 discharred to CIR 7/27 recurrent VF, admit to CVICU for DCCV. Intubated 7/28 recurrent VF, DCCV multiple times 7/29 PCCM consult for vent  7/30 vt overnight defib x2 7/31 DCCV x2, improved on versed / no further shocks  Consults:  EP PCCM  Procedures:  ETT 7/27 >> Art line 7/28 >> RUE PICC 6/28 >>  R Femoral Venous Sheath 7/30 >>  IABP 7/30 >>   Significant Diagnostic Tests:    Micro Data:  7/28 MRSA > neg 7/28 BCx >>  7/28 UCx >>   7/29 tracheal aspirate >> few klesiella pneumoniae >> R-ampicillin, S-cefazolin  Antimicrobials:  Vanc 7/27 >> 7/29 Cefepime 7/27 >> 8/1 Cefazolin 8/1  >>8/3  Interim history/subjective:  Had more rounds of Vfib with coughing fits yesterday. IABP remains in place.  Objective   Blood pressure (!) 123/58, pulse (!) 31, temperature 99.5 F (37.5 C), resp. rate 19, height 5\' 9"  (1.753 m), weight 96.1 kg, SpO2 99 %. CVP:  [5 mmHg-16 mmHg] 5 mmHg  Vent Mode: PRVC FiO2 (%):  [50 %-60 %] 50 % Set Rate:  [18 bmp] 18 bmp Vt Set:  [560 mL] 560 mL PEEP:  [5 cmH20] 5 cmH20 Plateau Pressure:  [14 cmH20-23 cmH20] 14 cmH20   Intake/Output Summary (Last 24 hours) at 08/06/2019 0705 Last data filed at 08/06/2019 0700 Gross per 24 hour  Intake 3779.17 ml  Output 4520 ml  Net -740.83 ml   Filed Weights   08/04/19 0500 08/05/19 0600 08/06/19 0552  Weight: 97.1 kg 97.2 kg 96.1 kg    Examination: GEN: no acute distress on vent HEENT: ETT in place, minimal secretions CV: LVAD hum, ext warm PULM: rhonci bilaterally, triggering vent GI: +BS, rectal tube in place EXT: diffuse anasarca NEURO: moves all 4 ext to command PSYCH: RASS -1 SKIN: No rashes  SvO2 80% K/Mg look good BUN/Cr stable CBC stable CBG okay -57cc yesterday  Resolved Hospital Problem list   Shock  Sepsis due to UTI, Klebsiella Pneumoniae PNA-cefazolin, complete 7 days total (8/3 end date)  Assessment & Plan:   Acute Hypoxic Respiratory Failure in setting of Recurrent VF, volume overloaded state of heart -PRVC 8cc/kg -VAP prevention measures  -Extubation pending IABP liberation  Sedation Needs for Mechanical Ventilation  -PAD protocol with fentanyl + propofol gtt for RASS Goal -1 to -2  Recurrent VFib requiring repeated DCCV -Cardiology / EP following  -continue lidocaine, amiodarone, digoxin -optimize electrolytes, goal K>4, Mg>2  Acute systolic heart failure s/p LVAD placement (06/25/19) -continue supportive measures, diuresis as tolerated by renal function   -diuretic, inotrope, IABP management per primary  Coagulopathy, on warfarin PTA -heparin per pharmacy,  goal level 0.2-0.4  At Risk Malnutrition in setting of Critical Illness -continue TF   Best practice:  Diet: TF  Pain/Anxiety/Delirium protocol (if indicated): Fentanyl + propofol gtt, PO klonipin VAP protocol (if indicated): yes DVT prophylaxis: heparin per pharmacy  GI prophylaxis: protonix  Glucose control: SSI Mobility: BR Code Status: Full  Family Communication: Per primary Disposition: ICU     The patient is critically ill with multiple organ systems failure and requires high complexity decision making for assessment and support, frequent evaluation and titration of therapies, application of advanced monitoring technologies and extensive interpretation of multiple databases. Critical Care Time devoted to patient care services described in this note independent of APP/resident time (if applicable)  is 31 minutes.   Erskine Emery MD [facility] 08/06/2019 7:05 AM Personal pager: [phone] If unanswered, please page CCM On-call: [phone]

## 2019-08-06 NOTE — Progress Notes (Signed)
Order for sheath removal verified per post procedural orders. Procedure explained to nurse patient was sedated and intubated.  Right femoral artery access site assessed: level 0, palpable dorsalis pedis and posterior tibial pulses. 7.5 French Sheath removed and manual pressure applied for 25 minutes. Pre, peri, & post procedural vitals: HR 70, RR 12, O2 Sat 98%, BP 89/60, Pain level 0. Distal pulses remained intact after sheath removal. Access site level 0 and dressed with 4X4 gauze and tegaderm.RN confirmed condition of site. Post procedural instructions discussed with return demonstration from patient.

## 2019-08-06 NOTE — Significant Event (Signed)
0930-Heparin drip off at verbal order of Dr. Aundra Dubin for anticipation of IABP removal. Will draw ACT at 10:30.  10:30am-Cath lab already made aware of plans for them to remove balloon; ACT 147.

## 2019-08-06 NOTE — Progress Notes (Signed)
ANTICOAGULATION CONSULT NOTE  Pharmacy Consult for Heparin Indication: LVAD>IABP  Allergies  Allergen Reactions   Codeine Hives    Patient Measurements: Height: 5\' 9"  (175.3 cm) Weight: 96.1 kg (211 lb 13.8 oz) IBW/kg (Calculated) : 70.7 Heparin Dosing Weight: ~ 92 kg  Vital Signs: Temp: 99.1 F (37.3 C) (08/05 1000) Temp Source: Bladder (08/05 1000) BP: 123/58 (08/05 0211) Pulse Rate: 58 (08/05 1000)  Labs: Recent Labs    08/04/19 0432 08/04/19 0432 08/04/19 1219 08/04/19 1221 08/05/19 0441 08/05/19 0441 08/05/19 1644 08/05/19 1749 08/06/19 0456 08/06/19 0458  HGB 8.5*   < > 9.5*   < > 8.4*  --   --   --   --  8.8*  HCT 28.2*   < > 28.0*  --  28.2*  --   --   --   --  29.3*  PLT 179  --   --   --  186  --   --   --   --  206  LABPROT 15.6*  --   --   --  15.7*  --   --   --  14.9  --   INR 1.3*  --   --   --  1.3*  --   --   --  1.2  --   HEPARINUNFRC 0.18*  --   --   --  0.28*  --   --   --  0.24*  --   CREATININE 1.71*  --   --    < > 1.83*   < > 1.91* 1.94* 1.87*  --    < > = values in this interval not displayed.    Estimated Creatinine Clearance: 51.1 mL/min (A) (by C-G formula based on SCr of 1.87 mg/dL (H)).  Assessment: 54 yo male s/p LVAD HM3 placement 6/24, followed by IABP 7/8-7/13. 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  Heparin level 0.24 at goal range this AM.   Urine with pink-tinge earlier in the week now cleared, Hgb stable   Will hold heparin later today for plan pull IABP   Goal of Therapy:  Heparin level goal 0.2-0.5 Monitor platelets by anticoagulation protocol: Yes   Plan:  Continue IV heparin at current rate of 1150 units/hr Turn off later today to pull IABP    Bonnita Nasuti Pharm.D. CPP, BCPS Clinical Pharmacist [phone] 08/06/2019 10:14 AM   [facility] pharmacy phone numbers are listed on [email or web address]

## 2019-08-06 NOTE — Progress Notes (Addendum)
Progress Note  Patient Name: Aaron [name] Date of Encounter: 08/06/2019  [facility] Cardiologist: Dr. Marlou Porch  Subjective   Intubated and sedated, opens eyes  Inpatient Medications    Scheduled Meds: . alteplase  2 mg Intracatheter Once  . aspirin  81 mg Per Tube Daily  . atorvastatin  80 mg Per Tube Daily  . bisacodyl  10 mg Rectal Once  . chlorhexidine gluconate (MEDLINE KIT)  15 mL Mouth Rinse BID  . Chlorhexidine Gluconate Cloth  6 each Topical Daily  . clonazePAM  1 mg Per Tube BID  . docusate  100 mg Per Tube BID  . feeding supplement (PROSource TF)  90 mL Per Tube BID  . folic acid  1 mg Per Tube Daily  . free water  20 mL Per Tube Q4H  . gabapentin  300 mg Per Tube Q8H  . mouth rinse  15 mL Mouth Rinse 10 times per day  . midodrine  10 mg Oral TID WC  . multivitamin  15 mL Per Tube Daily  . pantoprazole sodium  40 mg Per Tube Daily  . polyethylene glycol  17 g Oral Daily  . potassium chloride  40 mEq Per Tube Q4H  . sildenafil  20 mg Per Tube TID  . sodium chloride flush  10-40 mL Intracatheter Q12H  . thiamine  100 mg Per Tube Daily   Continuous Infusions: . sodium chloride    . sodium chloride Stopped (08/05/19 1236)  . sodium chloride 20 mL/hr at 08/01/19 1700  . amiodarone 30 mg/hr (08/06/19 0700)  . feeding supplement (VITAL 1.5 CAL) 1,000 mL (08/05/19 1650)  . fentaNYL infusion INTRAVENOUS 250 mcg/hr (08/06/19 0700)  . furosemide (LASIX) infusion 12 mg/hr (08/06/19 0700)  . heparin 1,150 Units/hr (08/06/19 0720)  . lidocaine 1 mg/min (08/06/19 0700)  . midazolam Stopped (08/05/19 1345)  . phenylephrine (NEO-SYNEPHRINE) Adult infusion Stopped (08/05/19 1253)  . propofol (DIPRIVAN) infusion 10 mcg/kg/min (08/06/19 0700)  . vasopressin Stopped (07/11/2019 0051)   PRN Meds: Place/Maintain arterial line **AND** sodium chloride, sodium chloride, acetaminophen, fentaNYL, midazolam, sodium chloride flush   Vital Signs    Vitals:   08/06/19 0500  08/06/19 0552 08/06/19 0600 08/06/19 0700  BP:      Pulse: (!) 30  (!) 32 (!) 31  Resp: _0 Temp: 99.7 F (37.6 C)  99.9 F (37.7 C) 99.5 F (37.5 C)  TempSrc:      SpO2: 100%  97% 99%  Weight:  96.1 kg    Height:        Intake/Output Summary (Last 24 hours) at 08/06/2019 0732 Last data filed at 08/06/2019 0700 Gross per 24 hour  Intake 3779.17 ml  Output 4520 ml  Net -740.83 ml   Last 3 Weights 08/06/2019 08/05/2019 08/04/2019  Weight (lbs) 211 lb 13.8 oz 214 lb 4.6 oz 214 lb 1.1 oz  Weight (kg) 96.1 kg 97.2 kg 97.1 kg      Telemetry    SB 50's- 60's1st degree AVblock - Personally Reviewed  ECG    No new EKGs - Personally Reviewed  Physical Exam   Exam remains essentially unchanged GEN: sedated Neck: No JVD Cardiac: VAD hum.  Respiratory: CTA b/l, intubated GI: Soft, non-distended  MS:  generalized edema; No deformity. Neuro:  unable to assess Psych: unable to assess   Labs    High Sensitivity Troponin:   Recent Labs  Lab 07/09/19 0740 07/09/19 0920  TROPONINIHS 3,337* 3,481*  Chemistry Recent Labs  Lab 08/01/19 0322 08/01/19 1440 08/05/19 1644 08/05/19 1749 08/06/19 0456  NA 128*   < > 135 136 137  K 4.2   < > 5.2* 4.7 4.4  CL 100   < > 101 101 102  CO2 19*   < > 24 23 21*  GLUCOSE 131*   < > 151* 133* 172*  BUN 26*   < > 88* 84* 87*  CREATININE 0.93   < > 1.91* 1.94* 1.87*  CALCIUM 8.1*   < > 8.1* 8.2* 7.8*  PROT 6.0*  --   --   --   --   ALBUMIN 2.2*  --   --   --   --   AST 60*  --   --   --   --   ALT 71*  --   --   --   --   ALKPHOS 157*  --   --   --   --   BILITOT 1.0  --   --   --   --   GFRNONAA >60   < > 39* 38* 40*  GFRAA >60   < > 45* 44* 46*  ANIONGAP 9   < > _0 < > = values in this interval not displayed.     Hematology Recent Labs  Lab 08/04/19 0432 08/04/19 0432 08/04/19 1219 08/05/19 0441 08/06/19 0458  WBC 8.0  --   --  8.8 10.9*  RBC 3.04*  --   --  3.00* 3.10*  HGB 8.5*   < > 9.5* 8.4* 8.8*    HCT 28.2*   < > 28.0* 28.2* 29.3*  MCV 92.8  --   --  94.0 94.5  MCH 28.0  --   --  28.0 28.4  MCHC 30.1  --   --  29.8* 30.0  RDW 17.3*  --   --  17.7* 17.9*  PLT 179  --   --  186 206   < > = values in this interval not displayed.    BNPNo results for input(s): BNP, PROBNP in the last 168 hours.   DDimer No results for input(s): DDIMER in the last 168 hours.   Radiology      Cardiac Studies     07/09/2019: TTE IMPRESSIONS   1. Left ventricular ejection fraction, by estimation, is <20%. The left  ventricle has severely decreased function. The left ventricle demonstrates  global hypokinesis. The left ventricular internal cavity size was mildly  dilated. There is mild left  ventricular hypertrophy.   2. Right ventricular systolic function is mildly reduced. The right  ventricular size is normal.   3. The mitral valve is normal in structure. Trivial mitral valve  regurgitation. No evidence of mitral stenosis.   4. The aortic valve is poorly visualized but does not appear to open.   5. Ramp echo done with speed increased from 5000 rpm to 5100 rpm.     06/17/2019: R/LHC  Mid LM lesion is 75% stenosed.  Ost LAD lesion is 100% stenosed.  Ost Cx to Prox Cx lesion is 100% stenosed.  Ost RCA lesion is 100% stenosed.  Hemodynamic findings consistent with severe pulmonary hypertension.  Intra-aortic balloon pump was placed to help with hemodynamic stability  Intervention was deferred unable to cross with a wire ostial LAD  Intervention was otherwise deferred in favor of bypass surgery   Conclusion Evidence of moderate to severe pulmonary hypertension Mean wedge of  80 mean PA of 53 Severe dilated cardiomyopathy probably ischemic ejection fraction less than 25% Moderate calcification of the proximal coronary arteries Multiple CTO's ostial LAD proximal circumflex ostial RCA Mild to moderate collaterals right to left to the LAD Minimal right to right collaterals Minimal  left to left collaterals Coronaries 75% mid to distal left main 100% ostial LAD probable CTO 100% proximal circumflex probable CTO 100% ostial RCA probable CTO Collaterals as described Intervention was deferred after unable to cross with the wire for what was thought to be suspected CTO's of the ostial LAD Intra-aortic balloon pump was placed right femoral artery to help with hemodynamic stability 7 [place] was placed on left and the PA position to help with hemodynamic monitoring Patient was placed on IV heparin for anticoagulation Arrangements were made for transfer to [facility] for possible ICU CCU care and possible evaluation for coronary bypass surgery Dr. Nechama Guard accepted the patient in transfer to CCU:    Patient Profile     54 y.o. 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  presented 06/18/19 with acute anterolateral STEMI.  He underwent emergent cath and was found to have multivessel CAD.  PCI of the LAD was unsuccessful (there were no targets for CABG).  He required IABP and subsequent VAD for cardiogenic shock. - HM3 placement 6/24  - Had 2 episodes of VF requiring emergent DC-CV on 07/02/19 - Recurrent VF on 7/2 and broke spontaneously.  - 7/3 Recurrent VF at 2a.-> emergent DC-CV.  - 07/07/2019 VT/[place] -> repeat DC_CV - 07/08/2019 VF/[place] x2--> DC-CV x2.  - 07/09/19 developed sepsis/PNA -> intubated - 07/09/19 Multiple episodes of VT/VF -> IABP placed. AV pacing via EPW He did eventually improve, transitioned to PO AAD with amiodarone and mexiletine. IABP was removed, HR improved and EPW removed, ultimately mexiletine was stopped, amio continued and able to be discharged to [facility] 07/21/2019 Notes report not a transplant candidate given smoker.   Developed VF in [place] and readmitted 07/04/2019 > emergent defibrillation started on amio gtt and mexiletine 07/30/2019 developed refractory VF requiring numerous shocks > amio  gtt, lido gtt, intubated on pressors Subsequently pressors have been stopped Continued to have VF 07/03/2019 >  IABP and temp pacing  Over the weekend continued to have VF episodes, temp pacing stopped VF 08/02/19 night x1, defibrillated, not rebolused VF 08/04/19 afternoon (defib x3), amio increased back to 60 and IABP back to 1:2 PMVT> VF  08/05/19 afternoon defib x1  Assessment & Plan    1. Refractory VF      amio gtt remains, is at 30 this AM Lido gtt at @ 1 (lido level yesterday 1.9, today is pending) Remains off pressor, on midodrine On versed/fentanyl  IABP 1:3 this AM  K+ 4.4 Mag 2.1  Unable to give Ranexa and mexiletine through tube   2. SB/SR 50's-70's, 1st degree AVblock     Dig stopped 08/04/19   IABP removal and extubate when able in d/w AHF NP, hopes for  today keep amio/lidocaine gtts until extubated at least and consider > PO in coming days if able, goal to get on PO amiodarone, ranexa and mexiletine    Dr. Rayann Heman has seen and examined the patient this AM     For questions or updates, please contact [facility] Please consult [email or web address] for contact info under     Signed, Baldwin [place], PA-C  08/06/2019, 7:32 AM     I have  seen, examined the patient, and reviewed the above assessment and plan.  Changes to above are made where necessary.  On exam, RRR. Ill appearing.  He had VF again yesterday. Continue current therapy for now.  Co Sign: Thompson Grayer, MD 08/06/2019 10:14 AM

## 2019-08-07 ENCOUNTER — Inpatient Hospital Stay (HOSPITAL_COMMUNITY): Payer: BC Managed Care – PPO

## 2019-08-07 LAB — CBC WITH DIFFERENTIAL/PLATELET
Abs Immature Granulocytes: 0 10*3/uL (ref 0.00–0.07)
Basophils Absolute: 0 10*3/uL (ref 0.0–0.1)
Basophils Relative: 0 %
Eosinophils Absolute: 0 10*3/uL (ref 0.0–0.5)
Eosinophils Relative: 0 %
HCT: 31.6 % — ABNORMAL LOW (ref 39.0–52.0)
Hemoglobin: 9.7 g/dL — ABNORMAL LOW (ref 13.0–17.0)
Lymphocytes Relative: 10 %
Lymphs Abs: 1.6 10*3/uL (ref 0.7–4.0)
MCH: 27.8 pg (ref 26.0–34.0)
MCHC: 30.7 g/dL (ref 30.0–36.0)
MCV: 90.5 fL (ref 80.0–100.0)
Monocytes Absolute: 0.8 10*3/uL (ref 0.1–1.0)
Monocytes Relative: 5 %
Neutro Abs: 13.2 10*3/uL — ABNORMAL HIGH (ref 1.7–7.7)
Neutrophils Relative %: 85 %
Platelets: 277 10*3/uL (ref 150–400)
RBC: 3.49 MIL/uL — ABNORMAL LOW (ref 4.22–5.81)
RDW: 18.2 % — ABNORMAL HIGH (ref 11.5–15.5)
WBC: 15.5 10*3/uL — ABNORMAL HIGH (ref 4.0–10.5)
nRBC: 0 % (ref 0.0–0.2)
nRBC: 0 /100 WBC

## 2019-08-07 LAB — COMPREHENSIVE METABOLIC PANEL
ALT: 25 U/L (ref 0–44)
AST: 87 U/L — ABNORMAL HIGH (ref 15–41)
Albumin: 2.2 g/dL — ABNORMAL LOW (ref 3.5–5.0)
Alkaline Phosphatase: 157 U/L — ABNORMAL HIGH (ref 38–126)
Anion gap: 14 (ref 5–15)
BUN: 103 mg/dL — ABNORMAL HIGH (ref 6–20)
CO2: 22 mmol/L (ref 22–32)
Calcium: 8.7 mg/dL — ABNORMAL LOW (ref 8.9–10.3)
Chloride: 99 mmol/L (ref 98–111)
Creatinine, Ser: 2.1 mg/dL — ABNORMAL HIGH (ref 0.61–1.24)
GFR calc Af Amer: 40 mL/min — ABNORMAL LOW (ref >60)
GFR calc non Af Amer: 34 mL/min — ABNORMAL LOW (ref >60)
Glucose, Bld: 169 mg/dL — ABNORMAL HIGH (ref 70–99)
Potassium: 4.3 mmol/L (ref 3.5–5.1)
Sodium: 135 mmol/L (ref 135–145)
Total Bilirubin: 0.6 mg/dL (ref 0.3–1.2)
Total Protein: 6.4 g/dL — ABNORMAL LOW (ref 6.5–8.1)

## 2019-08-07 LAB — PHOSPHORUS: Phosphorus: 4.7 mg/dL — ABNORMAL HIGH (ref 2.5–4.6)

## 2019-08-07 LAB — COOXEMETRY PANEL
Carboxyhemoglobin: 1.8 % — ABNORMAL HIGH (ref 0.5–1.5)
Methemoglobin: 1.4 % (ref 0.0–1.5)
O2 Saturation: 78.4 %
Total hemoglobin: 9.7 g/dL — ABNORMAL LOW (ref 12.0–16.0)

## 2019-08-07 LAB — BASIC METABOLIC PANEL
Anion gap: 14 (ref 5–15)
Anion gap: 13 (ref 5–15)
BUN: 100 mg/dL — ABNORMAL HIGH (ref 6–20)
BUN: 103 mg/dL — ABNORMAL HIGH (ref 6–20)
CO2: 26 mmol/L (ref 22–32)
CO2: 26 mmol/L (ref 22–32)
Calcium: 8.7 mg/dL — ABNORMAL LOW (ref 8.9–10.3)
Calcium: 8.4 mg/dL — ABNORMAL LOW (ref 8.9–10.3)
Chloride: 99 mmol/L (ref 98–111)
Chloride: 99 mmol/L (ref 98–111)
Creatinine, Ser: 2.13 mg/dL — ABNORMAL HIGH (ref 0.61–1.24)
Creatinine, Ser: 2.11 mg/dL — ABNORMAL HIGH (ref 0.61–1.24)
GFR calc Af Amer: 39 mL/min — ABNORMAL LOW (ref >60)
GFR calc Af Amer: 40 mL/min — ABNORMAL LOW (ref >60)
GFR calc non Af Amer: 34 mL/min — ABNORMAL LOW (ref >60)
GFR calc non Af Amer: 34 mL/min — ABNORMAL LOW (ref >60)
Glucose, Bld: 137 mg/dL — ABNORMAL HIGH (ref 70–99)
Glucose, Bld: 150 mg/dL — ABNORMAL HIGH (ref 70–99)
Potassium: 4.3 mmol/L (ref 3.5–5.1)
Potassium: 4 mmol/L (ref 3.5–5.1)
Sodium: 139 mmol/L (ref 135–145)
Sodium: 138 mmol/L (ref 135–145)

## 2019-08-07 LAB — CBC
HCT: 28.8 % — ABNORMAL LOW (ref 39.0–52.0)
Hemoglobin: 8.9 g/dL — ABNORMAL LOW (ref 13.0–17.0)
MCH: 28.6 pg (ref 26.0–34.0)
MCHC: 30.9 g/dL (ref 30.0–36.0)
MCV: 92.6 fL (ref 80.0–100.0)
Platelets: 229 10*3/uL (ref 150–400)
RBC: 3.11 MIL/uL — ABNORMAL LOW (ref 4.22–5.81)
RDW: 18.2 % — ABNORMAL HIGH (ref 11.5–15.5)
WBC: 12.1 10*3/uL — ABNORMAL HIGH (ref 4.0–10.5)
nRBC: 0.2 % (ref 0.0–0.2)

## 2019-08-07 LAB — GLUCOSE, CAPILLARY
Glucose-Capillary: 105 mg/dL — ABNORMAL HIGH (ref 70–99)
Glucose-Capillary: 117 mg/dL — ABNORMAL HIGH (ref 70–99)
Glucose-Capillary: 125 mg/dL — ABNORMAL HIGH (ref 70–99)
Glucose-Capillary: 135 mg/dL — ABNORMAL HIGH (ref 70–99)
Glucose-Capillary: 159 mg/dL — ABNORMAL HIGH (ref 70–99)
Glucose-Capillary: 164 mg/dL — ABNORMAL HIGH (ref 70–99)

## 2019-08-07 LAB — MAGNESIUM
Magnesium: 2 mg/dL (ref 1.7–2.4)
Magnesium: 2 mg/dL (ref 1.7–2.4)

## 2019-08-07 LAB — PROTIME-INR
INR: 1.2 (ref 0.8–1.2)
Prothrombin Time: 15 seconds (ref 11.4–15.2)

## 2019-08-07 LAB — LACTATE DEHYDROGENASE: LDH: 234 U/L — ABNORMAL HIGH (ref 98–192)

## 2019-08-07 LAB — HEPARIN LEVEL (UNFRACTIONATED): Heparin Unfractionated: 0.28 IU/mL — ABNORMAL LOW (ref 0.30–0.70)

## 2019-08-07 MED ORDER — ROCURONIUM BROMIDE 50 MG/5ML IV SOLN
80.0000 mg | Freq: Once | INTRAVENOUS | Status: AC
  Administered 2019-08-07: 80 mg via INTRAVENOUS
  Filled 2019-08-07: qty 8

## 2019-08-07 MED ORDER — CHLORHEXIDINE GLUCONATE 0.12% ORAL RINSE (MEDLINE KIT)
15.0000 mL | Freq: Two times a day (BID) | OROMUCOSAL | Status: DC
  Administered 2019-08-07 – 2019-08-19 (×22): 15 mL via OROMUCOSAL

## 2019-08-07 MED ORDER — DEXMEDETOMIDINE HCL IN NACL 400 MCG/100ML IV SOLN
0.4000 ug/kg/h | INTRAVENOUS | Status: DC
  Administered 2019-08-07: 0.6 ug/kg/h via INTRAVENOUS
  Filled 2019-08-07 (×2): qty 100

## 2019-08-07 MED ORDER — ORAL CARE MOUTH RINSE
15.0000 mL | OROMUCOSAL | Status: DC
  Administered 2019-08-07 – 2019-08-20 (×124): 15 mL via OROMUCOSAL

## 2019-08-07 MED ORDER — FENTANYL CITRATE (PF) 100 MCG/2ML IJ SOLN
INTRAMUSCULAR | Status: AC
  Administered 2019-08-07: 50 ug
  Filled 2019-08-07: qty 2

## 2019-08-07 MED ORDER — POTASSIUM CHLORIDE 20 MEQ/15ML (10%) PO SOLN
40.0000 meq | Freq: Two times a day (BID) | ORAL | Status: DC
  Administered 2019-08-07 – 2019-08-11 (×8): 40 meq
  Filled 2019-08-07 (×8): qty 30

## 2019-08-07 MED ORDER — MIDAZOLAM HCL 2 MG/2ML IJ SOLN
INTRAMUSCULAR | Status: AC
  Administered 2019-08-07: 2 mg
  Filled 2019-08-07: qty 2

## 2019-08-07 MED ORDER — ETOMIDATE 2 MG/ML IV SOLN
20.0000 mg | Freq: Once | INTRAVENOUS | Status: AC
  Administered 2019-08-07: 20 mg via INTRAVENOUS

## 2019-08-07 MED ORDER — MIDODRINE HCL 5 MG PO TABS
15.0000 mg | ORAL_TABLET | Freq: Three times a day (TID) | ORAL | Status: DC
  Administered 2019-08-07 – 2019-08-08 (×4): 15 mg via ORAL
  Filled 2019-08-07 (×4): qty 3

## 2019-08-07 MED ORDER — CHLORHEXIDINE GLUCONATE 0.12 % MT SOLN: 15.0000 mL | Freq: Two times a day (BID) | OROMUCOSAL | Status: DC

## 2019-08-07 MED ORDER — ORAL CARE MOUTH RINSE
15.0000 mL | Freq: Two times a day (BID) | OROMUCOSAL | Status: DC
  Administered 2019-08-07: 15 mL via OROMUCOSAL

## 2019-08-07 NOTE — Progress Notes (Signed)
90 mL of Fentanyl wasted in sink with Eloise Harman RN.

## 2019-08-07 NOTE — Procedures (Signed)
Extubation Procedure Note  Patient Details:   Name: PATTON RABINOVICH DOB: [birth year] MRN: [id number]   Airway Documentation:    Vent end date: 08/07/19 Vent end time: 1159   Evaluation  O2 sats: transiently fell during during procedure and are now acceptable. Complications: Complications of pt desated, brady down, and pt not moving much air at time of extubation but is now acceptable per MD. Patient did not tolerate procedure well. Bilateral Breath Sounds: Diminished, Rhonchi   No   Pt was extubated per MD order. Pt saturations fell and bradycardia occurred. Pt was manually bagged and neck thrust used with oral airway. Pt saturations came back up. MD and NP with CCM both in the room to assist. Pt was not moving much air immediately after extubation, but once bagged he started moving air much better. No stridor heard. Per MD place pt on NIV/BiPAP. Pt tolerating well at this time. Pt was not able to speak afterwards, MD is aware and is okay with that at this time. Pt was however able to respond to voice by squeezing MD's hand when asked. RT will continue to monitor pt status.  Aryaa Bunting A Daimen Shovlin 08/07/2019, 12:10 PM

## 2019-08-07 NOTE — Progress Notes (Signed)
1543 shocked for VTach and converted to NSR, Dr. Aundra Dubin present. 1544 Bolused 150 Amio, increased rate to 60mg /h for 6 hours, Bolus 50mg  Lidocaine. [place] for VTach and converted to NSR 1549 Shocked for [employer] and converted to NSR 1555 Shocked for [employer] and converted to NSR 1557 shock for Vtach and converted to NSR 1610 Intubated with Dr. Erskine Emery 1630 Shocked for [place] and converted to [place] LVad coordinator made aware. 73 family updated. Eden Emms, RN

## 2019-08-07 NOTE — Progress Notes (Signed)
LVAD Coordinator Rounding Note:  HM III LVAD implanted on 06/25/19 by Dr. Orvan Seen under Destination Therapy criteria due to recent smoking history.  Pt's transferred to CIR on 07/24/19. Had refractory VF 07/30/2019 and transferred back to [place].   Pt remains intubated, sedated. Sister updated per Emerson Monte, VAD Coordinatorl    Vital signs: Temp: 100.6 HR: 64 Art. Line:  82/68 (76) Doppler:   O2 Sat: 98% on 50% FiO2 5 CPAP Wt: 189.6>196.4>200.8>209>209.6>210.7>214.1>214.2>211.8>208  lbs  LVAD parameters: Speed:  5300 Flow: 4.2 Power:  3.9w PI: 4.0 Hct: 29  Alarms: none Events: 20 PI events today  Fixed speed: 5300 Low speed limit: 5000    Drive Line:  Existing dressing clean, dry, intact. Anchor intact and accurately applied. Continue q 3 day dressing changes per bedside nurse. Next dressing change due: 08/09/19.   Labs:  LDH trend: 326>250>256>358>297>309>266>243>235>234  INR trend: 2.1>3.5>2.2>1.5>1.5>1.3>1.3>1.3>1.2>1.2  Anticoagulation Plan: -INR Goal:  2.0 - 2.5 -ASA Dose: 81 mg - will keep going for CAD for 3 mths stop date 09/25/19  Device: N/A  IABP: - placed 07/04/19 - dc'd 08/06/19  Drips: - Amiodarone 30 mg/hr - Fentanyl 100 mcg/ml - Lidocaine 1 mg/min - Lasix 12 mg/hr - Heparin 1150 mg/hr - Propofol 25 mcg/kg/min - Neo 20 mcg/kg/min  - Tube feed - off   Infection: - 07/02/2019 - BCs>>negative - 07/08/2019 - urine culture>>negative - 07/30/19 - respiratory culture>>Klebsiella pneumoniae - 08/04/19 - BCs>>pending  Arrythmias:  - 07/24/2019 Vfib while in CIR; emergently transferred back to 2H>> DCCV back to NSR; Amio gtt - 07/10/2019 recurrent VF>>increased amio gtt; Lido bolus/gtt; DC-CV 200J to NSR - 07/07/2019 recurrent VF>>8 separate DC-CV throughout night; required pressors for BP support and re-intubation - 07/06/2019 recurrent VT/VF>>7 separate DC-CV throughout am; IABP and pacer urgently placed in Cath Lab - 07/24/2019 recurrent VT/VF>>2 additional DC-CV after IABP  placment - 08/01/19 recurrent VT/VF>> pt had 3 episodes of VT/VR with 4 DC-CV - 08/02/19 recurrent VT/VF>>one DC-CV to SR - 08/04/19 recurrent VT/VF>>5 DC-CV to SR - 08/05/19 recurrent VT/VF>> one DC-CV to SR - 08/06/19 recurrent VT/VF>> episode required 3 DC-CV to SR  Respiratory:  - intubated 07/10/2019   Plan/Recommendations:  1. Call VAD pager if any VAD equipment or drive line issues. 2. Every three day dressing changes per bedside nurse.  Zada Girt RN [place] Coordinator  Office: [phone]  24/7 Pager: [phone]

## 2019-08-07 NOTE — Progress Notes (Signed)
Telemetry/chart reviewed. SB 50's with 1st degree AVblock K+ and Mag look good Lidocaine level today is pending, yesterday was 2.5 On Midodrine 10mg  TID amio gtt at 30 Lidocaine gtt at 1 Back on neo  IABP out yesterday During vent wean yesterday afternoon he had more VF and required defibrillation. None since  No new EP recommendations.  Tommye Standard, PA-C

## 2019-08-07 NOTE — Progress Notes (Signed)
7 Days Post-Op Procedure(s) (LRB): TEMPORARY PACEMAKER (N/A) IABP Insertion (N/A) Subjective: Making progress- IABP out, patient extubated with minimal problems with ventricular arrhytmia Somnolent , needs BIPAP- lidocaine level pending LVAD performance satisfactory Surgical incisions clean, dry Objective: Vital signs in last 24 hours: Temp:  [99.1 F (37.3 C)-100.8 F (38.2 C)] 100.6 F (38.1 C) (08/06 1400) Pulse Rate:  [46-191] 66 (08/06 1400) Cardiac Rhythm: Normal sinus rhythm;Sinus bradycardia (08/06 1200) Resp:  [16-63] 23 (08/06 1400) BP: (81-92)/(54-74) 92/74 (08/06 1400) SpO2:  [84 %-100 %] 98 % (08/06 1400) Arterial Line BP: (73-101)/(59-85) 83/69 (08/06 1400) FiO2 (%):  [40 %-70 %] 70 % (08/06 1202) Weight:  [94.4 kg] 94.4 kg (08/06 0600)  Hemodynamic parameters for last 24 hours: CVP:  [0 mmHg-83 mmHg] 14 mmHg  Intake/Output from previous day: 08/05 0701 - 08/06 0700 In: 3777.6 [I.V.:2162.6; NG/GT:1435] Out: 6761 [Urine:4930; Stool:325] Intake/Output this shift: Total I/O In: 1199.2 [I.V.:699.2; NG/GT:500] Out: 950 [Urine:950]  EXAM Wet cough normal VAD hum Extremities warm, pink  Lab Results: Recent Labs    08/06/19 0458 08/07/19 0808  WBC 10.9* 12.1*  HGB 8.8* 8.9*  HCT 29.3* 28.8*  PLT 206 229   BMET:  Recent Labs    08/06/19 0456 08/07/19 0528  NA 137 139  K 4.4 4.3  CL 102 99  CO2 21* 26  GLUCOSE 172* 137*  BUN 87* 100*  CREATININE 1.87* 2.13*  CALCIUM 7.8* 8.7*    PT/INR:  Recent Labs    08/07/19 0528  LABPROT 15.0  INR 1.2   ABG    Component Value Date/Time   PHART 7.338 (L) 08/04/2019 1219   HCO3 25.1 08/04/2019 1219   TCO2 27 08/04/2019 1219   ACIDBASEDEF 1.0 08/04/2019 1219   O2SAT 78.4 08/07/2019 0528   CBG (last 3)  Recent Labs    08/07/19 0329 08/07/19 0726 08/07/19 1143  GLUCAP 117* 164* 105*    Assessment/Plan: S/P Procedure(s) (LRB): TEMPORARY PACEMAKER (N/A) IABP Insertion (N/A) Will follow CXR  in AM   LOS: 10 days    Tharon Aquas Trigt III 08/07/2019

## 2019-08-07 NOTE — Progress Notes (Addendum)
Patient ID: Aaron [name], male   DOB: November 04, 1965, 54 y.o.   MRN: [id number]   Advanced Heart Failure VAD Team Note  PCP-Cardiologist: No primary care provider on file.   Subjective:    - Had refractory VF throughout the night 7/28 w/ ~10 shocks. Now intubated.  - Developed bradycardia and further VF 7/30 am, TTVP + IABP placed 7/30 am.   - 8/1 completed cefepime, switched to ancef. Had VF 08/02/19 shock x1  - 8/3 Echo ramp, increased speed to 5400. VF x3. Failed IABP wean. Speed decreased to 5300.   - 08/05/19 VF shock x1 - 8/5 IABP removed.  VT shock x 3 when sedation weaned and patient agitated.   Klebsiella in sputum, treated w/ Ancef. Blood CX repeated 08/04/19, NGTD. mTemp 99.7.   IABP removed yesterday. Remains intubated.   Co-ox 78% today. Continues on Neo 20. Maps mid 70s by A-line.  No further VT/VF overnight. Continues on amio 30 + lidocaine 1. Lidocaine level pending (2.5 yesterday pm).  Good diuresis yesterday w/ IV Lasix + metolazone. -4.9 L in UOP.  Bump in SCr 1.87>>2.13. BUN 100.  CVP 12.     LVAD INTERROGATION:  HeartMate 3 LVAD:   Flow 4.7 liters/min, speed 5350, power 3.8 PI 3.3   20 PI events yesterday. No PI events today thus far.   Objective:    Vital Signs:   Temp:  [99.1 F (37.3 C)-99.7 F (37.6 C)] 99.7 F (37.6 C) (08/05 2300) Pulse Rate:  [55-83] 62 (08/06 0340) Resp:  [13-63] 20 (08/06 0340) BP: (81-95)/(62-73) 85/72 (08/06 0340) SpO2:  [91 %-100 %] 98 % (08/06 0341) Arterial Line BP: (76-91)/(54-76) 85/71 (08/05 2300) FiO2 (%):  [40 %-50 %] 50 % (08/06 0341) Weight:  [94.4 kg] 94.4 kg (08/06 0600) Last BM Date: 08/06/19 Mean arterial Pressure 70s   Intake/Output:   Intake/Output Summary (Last 24 hours) at 08/07/2019 0815 Last data filed at 08/07/2019 0600 Gross per 24 hour  Intake 3629.87 ml  Output 5195 ml  Net -1565.13 ml     Physical Exam  CVP 12 Physical Exam: GENERAL: Intubated and sedated  HEENT: + ETT NECK: Supple, JVP ~12  cm  .  2+ bilaterally, no bruits.  No lymphadenopathy or thyromegaly appreciated.   CARDIAC:  Mechanical heart sounds with LVAD hum present.  LUNGS:  Intubated. CTAB ABDOMEN:  Soft, round, nontender, positive bowel sounds x4.     LVAD exit site: Dressing dry and intact.  No erythema or drainage.  Stabilization device present and accurately applied.   EXTREMITIES:  Warm and dry, no cyanosis, clubbing, rash or 1+ bilateral LE edema. + bilateral SCDs +RUE PICC NEUROLOGIC:  Intubated and sedated   Telemetry   NSR 60s. No VT/VF over night  Labs   Basic Metabolic Panel: Recent Labs  Lab 08/04/19 1221 08/04/19 1655 08/04/19 1743 08/04/19 1743 08/05/19 0441 08/05/19 0441 08/05/19 1644 08/05/19 1644 08/05/19 1749 08/06/19 0456 08/07/19 0528  NA   < >  --  135   < > 136  --  135  --  136 137 139  K   < >  --  3.6   < > 3.6  --  5.2*  --  4.7 4.4 4.3  CL   < >  --  100   < > 103  --  101  --  101 102 99  CO2   < >  --  20*   < > 20*  --  24  --  23 21* 26  GLUCOSE   < >  --  162*   < > 168*  --  151*  --  133* 172* 137*  BUN   < >  --  77*   < > 77*  --  88*  --  84* 87* 100*  CREATININE   < >  --  1.83*   < > 1.83*  --  1.91*  --  1.94* 1.87* 2.13*  CALCIUM   < >  --  8.3*   < > 7.7*   < > 8.1*   < > 8.2* 7.8* 8.7*  MG  --  2.2 2.4  --  2.0  --   --   --   --  2.1 2.0  PHOS  --   --   --   --   --   --  4.2  --  4.1  --   --    < > = values in this interval not displayed.    Liver Function Tests: Recent Labs  Lab 08/01/19 0322  AST 60*  ALT 71*  ALKPHOS 157*  BILITOT 1.0  PROT 6.0*  ALBUMIN 2.2*   No results for input(s): LIPASE, AMYLASE in the last 168 hours. No results for input(s): AMMONIA in the last 168 hours.  CBC: Recent Labs  Lab 08/02/19 1546 08/02/19 1546 08/03/19 0257 08/03/19 0257 08/03/19 1130 08/04/19 0432 08/04/19 1219 08/05/19 0441 08/06/19 0458  WBC 9.7  --  9.0  --   --  8.0  --  8.8 10.9*  HGB 8.8*   < > 8.4*   < > 9.5* 8.5* 9.5* 8.4* 8.8*    HCT 27.9*   < > 27.5*   < > 28.0* 28.2* 28.0* 28.2* 29.3*  MCV 93.0  --  92.3  --   --  92.8  --  94.0 94.5  PLT 152  --  159  --   --  179  --  186 206   < > = values in this interval not displayed.    INR: Recent Labs  Lab 08/03/19 0257 08/04/19 0432 08/05/19 0441 08/06/19 0456 08/07/19 0528  INR 1.3* 1.3* 1.3* 1.2 1.2    Other results:    Imaging   No results found.   Medications:     Scheduled Medications: . alteplase  2 mg Intracatheter Once  . aspirin  81 mg Per Tube Daily  . atorvastatin  80 mg Per Tube Daily  . bisacodyl  10 mg Rectal Once  . chlorhexidine gluconate (MEDLINE KIT)  15 mL Mouth Rinse BID  . Chlorhexidine Gluconate Cloth  6 each Topical Daily  . clonazePAM  1 mg Per Tube BID  . docusate  100 mg Per Tube BID  . feeding supplement (PROSource TF)  90 mL Per Tube BID  . folic acid  1 mg Per Tube Daily  . free water  20 mL Per Tube Q4H  . gabapentin  300 mg Per Tube Q8H  . insulin aspart  0-15 Units Subcutaneous Q4H  . mouth rinse  15 mL Mouth Rinse 10 times per day  . midodrine  10 mg Oral TID WC  . multivitamin  15 mL Per Tube Daily  . pantoprazole sodium  40 mg Per Tube Daily  . polyethylene glycol  17 g Oral Daily  . potassium chloride  40 mEq Per Tube Q6H  . sildenafil  20 mg Per Tube TID  . sodium chloride flush  10-40  mL Intracatheter Q12H  . thiamine  100 mg Per Tube Daily    Infusions: . sodium chloride    . sodium chloride Stopped (08/05/19 1236)  . sodium chloride 20 mL/hr at 08/01/19 1700  . amiodarone 30 mg/hr (08/07/19 0600)  . dexmedetomidine (PRECEDEX) IV infusion    . feeding supplement (VITAL 1.5 CAL) 65 mL/hr at 08/07/19 0000  . fentaNYL infusion INTRAVENOUS 100 mcg/hr (08/07/19 0600)  . furosemide (LASIX) infusion 12 mg/hr (08/07/19 0600)  . heparin 1,150 Units/hr (08/07/19 0600)  . lidocaine 1 mg/min (08/07/19 0600)  . midazolam Stopped (08/05/19 1345)  . phenylephrine (NEO-SYNEPHRINE) Adult infusion 20 mcg/min  (08/07/19 0600)  . propofol (DIPRIVAN) infusion 25 mcg/kg/min (08/07/19 0600)  . vasopressin Stopped (07/09/2019 0051)    PRN Medications: Place/Maintain arterial line **AND** sodium chloride, sodium chloride, acetaminophen, fentaNYL, midazolam, sodium chloride flush   Assessment/Plan:    1. Refractory Ventricular Fibrillation  -multiple episodes ~10 of VF on 7/28 - multiple episodes 7/30 am => IABP placed as well as TTVP given relative bradycardia.  Continued to have VF 7/30 , 7/31, 8/1, 8/3, 8/4   - Ranexa and Mexiletine currently on hold (unable to crush and administer through tube). Plan to resume Ranexa 500 mg bid + Mexiletine 200 mg bid once extubated  - Continue lidocaine gtt at 1, Check lidocaine level now.  - Continue amiodarone gtt at 30  mg/hr.   - Sotalol not a good option given his history of bradycardia.  - Off dig.  - Keep K > 4.0 and Mg > 2.0. - Bedside echo 7/28 cannula ok.  MAP improved post-IABP, wonder if we may actually be able to increase LVAD speed eventually to improve output and decrease drive for VT.    2. Acute Systolic Heart Failure s/p VAD - EF 62%, RV systolic function mildly reduced - HMIII LVAD placed 06/25/19.  - RV looks good on echo, cannula ok  - Co-ox 78% - 5L in UOP yesterday on lasix gtt, now w/ AKI, SCr 1.8>>2.13. BUN 100.  - Hold diuretics today. Stop lasix gtt - avoid inotropes with VF - Off dig with bradycardia.  - continue Heparin gtt, off warfarin currently    3. Acute hypoxic resp failure - intubated in setting of VF - vent management per PCCM   4. CAD - S/p Acute STEMI 6/21.Emergent cath showed severe multivessel disease w/ 75% mid- distal LM, 100% prox LAD, not amendable to PCI (unable to cross w/ guide wire, probable CTO), 100% prox LCx (probable CTO) and 100% ostial RCA (probable CTO). There were mildto moderate collaterals right to left to the LAD, minimal right to right collateralsan minimalleft to left collaterals.LVEF  25% -Hs trop peaked 27,000. - No targets for CABG / PCI.  - S/P HMIII VAD - Continue ASA + statin   5. Renal mass = RCC - MRI abdomen- 1.8 cm right renal lesion, most consistent with renal cell carcinoma. No evidence of renal vein involvement or nodal metastasis. - Discussed w/ Urology and R RCC is small and will just require surveillance. Ok to proceed with VAD from their standpoint.   6. Urinary retention - Foley placed - UA + for UTI. On abx  7. ID:  - ?HCAP => Klebsiella in trach aspirate from 7/29.  - Completed cefepime 08/02/19, switched to ancef (stopped 8/3) - Blood CX repeated 08/04/19, NGTD  8. Anemia  -Hgb 8.8  - No obvious source of bleeding.  - Repeat CBC in am. Transfuse Hgb <  7.5   9. AKI -Creatinine has been trending up, 1.87>>2.13. BUN 100 - hold diuretics today. Stop lasix gtt   Lyda Jester, PA-C  8:15 AM  08/07/2019  Patient seen with NP, agree with the above note.   Co-ox 78% today with CVP 11-12. IABP removed yesterday. Good diuresis yesterday with IV Lasix + metolazone but BUN up to 100 with cr 2.13. He continues on Ancef (Klebsiella in sputum culture sensitive).  Had VT yesterday with sedation weaning and agitation.  Remains on amiodarone gtt + lidocaine with no VT overnight.    MAP stable on midodrine 10 mg tid, phenyephrine 20.   General: Sleeping on vent  HEENT: Normal. Neck: Supple, JVP 10 cm. Carotids OK.  Cardiac:  Mechanical heart sounds with LVAD hum present.  Lungs:  CTAB, normal effort.  Abdomen:  NT, ND, no HSM. No bruits or masses. +BS  LVAD exit site: Well-healed and incorporated. Dressing dry and intact. No erythema or drainage. Stabilization device present and accurately applied. Driveline dressing changed daily per sterile technique. Extremities:  Warm and dry. No cyanosis, clubbing, rash. 1+ ankle edema. Neuro:  Will awaken and follow commands.    Good diuresis yesterday.  Still volume overload with CVP 11-12.   Co-ox 78%. The RV looked only mildly hypokinetic on 8/3 echo.  BUN/creatinine continuing to rise.  - Will hold diuretics today with rise in BUN/creatinine.  - Will increase midodrine to 15 mg tid, wean down phenylephrine as tolerated.   While intubated, continue amiodarone gtt and lidocaine gtt.  Lidocaine level ok yesterday, pending this morning.  After extubation and taking po, stop lido and start mexiletine + ranolazine.    Discussed with Dr. Tamala Julian, possible attempt at extubation today.  Wean sedation.   He is off abx.  Tm 100.4 this morning, will send cultures.   CRITICAL CARE Performed by: Loralie Champagne  Total critical care time: 40 minutes  Critical care time was exclusive of separately billable procedures and treating other patients.  Critical care was necessary to treat or prevent imminent or life-threatening deterioration.  Critical care was time spent personally by me on the following activities: development of treatment plan with patient and/or surrogate as well as nursing, discussions with consultants, evaluation of patient's response to treatment, examination of patient, obtaining history from patient or surrogate, ordering and performing treatments and interventions, ordering and review of laboratory studies, ordering and review of radiographic studies, pulse oximetry and re-evaluation of patient's condition.  Loralie Champagne 08/07/2019 9:45 AM

## 2019-08-07 NOTE — Procedures (Signed)
Intubation Procedure Note  NATHANUEL CABREJA  [id number]  1965-08-01  Date:08/07/19  Time:4:13 PM   Provider Performing:Champion Corales  Alfonse Spruce with Dr. Tamala Julian   Procedure: Intubation (31500)  Indication(s) Respiratory Failure  Consent Unable to obtain consent due to emergent nature of procedure.   Anesthesia Etomidate Rocuronium    Time Out Verified patient identification, verified procedure, site/side was marked, verified correct patient position, special equipment/implants available, medications/allergies/relevant history reviewed, required imaging and test results available.   Sterile Technique Usual hand hygeine, masks, and gloves were used   Procedure Description Patient positioned in bed supine.  Sedation given as noted above.  Patient was intubated with endotracheal tube using Glidescope.  View was Grade 1 full glottis .  Number of attempts was 1.  Colorimetric CO2 detector was consistent with tracheal placement.   Complications/Tolerance None; patient tolerated the procedure well. Chest X-ray is ordered to verify placement.   EBL Minimal   Specimen(s) None

## 2019-08-07 NOTE — Progress Notes (Signed)
NAME:  Aaron [name], MRN:  [id number], DOB:  May 10, 1965, LOS: 63 ADMISSION DATE:  07/27/2019, CONSULTATION DATE:  07/30/19 REFERRING MD:  Heart Failure, CHIEF COMPLAINT:  Respiratory failure -- VF, LVAD   Brief History   54 yo PMH acute systolic heart failure s/p LVAD (06/25/19), recurrent VT/VF/Torsades, multivessel CAD, R renal mass c/f RCC, suspected OSA, who presents back to ICU after prolonged hospital course after which he was transferred to [facility] 7/23. At CIR, pt developed dyspnea, hypoxia following PT 7/27. ECG obtained and reveals VF rate 210 bpm. Transferred to CVICU 7/27 for emergent DCCV. Intubated by anesthesia. DCCV performed numerous times 7/27, 7/28. Remains intubated requiring frequent cardioversion.  PCCM consulted 7/29 for vent management   Past Medical History  Acute systolic heart failure s/p LVAD CAD Recurrent VF, VT, torsades  Renal mass Debility Sleep apnea   Significant Hospital Events   6/16 admitted with acute anterolateral STEMI, cardiogenic shock requiring IABP 6/25 LVAD placement 7/03 VT/VT/Torsades requiring multiple emergent DCCV  7/06 recurrent VT/VT/Torsades requiring multiple emergent DCCV  7/07 recurrent VT/VT/Torsades requiring multiple emergent DCCV  7/08 PNA, respiratory failure, required intubation 7/13 IABP removed, extubated 7/23 discharred to CIR 7/27 recurrent VF, admit to CVICU for DCCV. Intubated 7/28 recurrent VF, DCCV multiple times 7/29 PCCM consult for vent  7/30 vt overnight defib x2 7/31 DCCV x2, improved on versed / no further shocks  Consults:  EP PCCM  Procedures:  ETT 7/27 >> Art line 7/28 >> RUE PICC 6/28 >>  R Femoral Venous Sheath 7/30 >>  IABP 7/30 >>   Significant Diagnostic Tests:    Micro Data:  See micro summary in epic  Antimicrobials:  See fever summary in epice  Interim history/subjective:  Vtach during PS trial yesterday.  Objective   Blood pressure (!) 85/72, pulse 62, temperature 99.7 F  (37.6 C), resp. rate 20, height 5\' 9"  (1.753 m), weight 94.4 kg, SpO2 98 %. CVP:  [11 mmHg-83 mmHg] 12 mmHg  Vent Mode: PRVC FiO2 (%):  [40 %-50 %] 50 % Set Rate:  [18 bmp] 18 bmp Vt Set:  [560 mL] 560 mL PEEP:  [5 cmH20] 5 cmH20 Pressure Support:  [10 cmH20] 10 cmH20 Plateau Pressure:  [16 cmH20-27 cmH20] 27 cmH20   Intake/Output Summary (Last 24 hours) at 08/07/2019 0701 Last data filed at 08/07/2019 0600 Gross per 24 hour  Intake 3777.56 ml  Output 4955 ml  Net -1177.44 ml   Filed Weights   08/05/19 0600 08/06/19 0552 08/07/19 0600  Weight: 97.2 kg 96.1 kg 94.4 kg    Examination: GEN: no acute distress on vent HEENT: ETT in place, minimal secretions CV: LVAD hum, ext warm PULM: rhonci bilaterally, triggering vent GI: +BS, rectal tube in place EXT: diffuse anasarca NEURO: moves all 4 ext to command PSYCH: RASS -1 SKIN: No rashes  SvO2 78% K/Mg look good BUN/Cr slightly up No CBC today CBG okay Net even  Resolved Hospital Problem list   Shock  Sepsis due to UTI, Klebsiella Pneumoniae PNA-cefazolin, complete 7 days total (8/3 end date)  Assessment & Plan:   Acute Hypoxic Respiratory Failure in setting of Recurrent VF, volume overloaded state of heart -PRVC 8cc/kg -VAP prevention measures  -Will probably have to just pull ETT and hope today, will d/w Dr. Aundra Dubin, SAT/SBT results in coughing fits which re-triggers VT, other antiarrythmic options cannot be crushed so need to be PO   Sedation Needs for Mechanical Ventilation  -PAD protocol with fentanyl + propofol gtt  for RASS Goal -1 to -2  Recurrent VFib requiring repeated DCCV -Cardiology / EP following  -continue lidocaine, amiodarone, digoxin -optimize electrolytes, goal K>4, Mg>2  Acute systolic heart failure s/p LVAD placement (06/25/19) -continue supportive measures, diuresis as tolerated by renal function   -diuretic, inotrope management per primary  Coagulopathy, on warfarin PTA -heparin per pharmacy,  goal level 0.2-0.4  At Risk Malnutrition in setting of Critical Illness -continue TF   Best practice:  Diet: TF  Pain/Anxiety/Delirium protocol (if indicated): Fentanyl + propofol gtt, PO klonipin VAP protocol (if indicated): yes DVT prophylaxis: heparin per pharmacy  GI prophylaxis: protonix  Glucose control: SSI Mobility: BR Code Status: Full  Family Communication: Per primary Disposition: ICU   The patient is critically ill with multiple organ systems failure and requires high complexity decision making for assessment and support, frequent evaluation and titration of therapies, application of advanced monitoring technologies and extensive interpretation of multiple databases. Critical Care Time devoted to patient care services described in this note independent of APP/resident time (if applicable)  is 33 minutes.   Erskine Emery MD [facility] 08/07/2019 7:01 AM Personal pager: [phone] If unanswered, please page CCM On-call: [phone]

## 2019-08-07 NOTE — Progress Notes (Signed)
[place] for Heparin Indication: LVAD>IABP  Allergies  Allergen Reactions  . Codeine Hives    Patient Measurements: Height: 5\' 9"  (175.3 cm) Weight: 94.4 kg (208 lb 1.8 oz) IBW/kg (Calculated) : 70.7 Heparin Dosing Weight: ~ 92 kg  Vital Signs: Temp: 100.8 F (38.2 C) (08/06 1426) Temp Source: Bladder (08/06 1200) BP: 92/74 (08/06 1426) Pulse Rate: 63 (08/06 1426)  Labs: Recent Labs    08/05/19 0441 08/05/19 0441 08/05/19 1644 08/05/19 1749 08/06/19 0456 08/06/19 0458 08/07/19 0528 08/07/19 0808  HGB 8.4*   < >  --   --   --  8.8*  --  8.9*  HCT 28.2*  --   --   --   --  29.3*  --  28.8*  PLT 186  --   --   --   --  206  --  229  LABPROT 15.7*  --   --   --  14.9  --  15.0  --   INR 1.3*  --   --   --  1.2  --  1.2  --   HEPARINUNFRC 0.28*  --   --   --  0.24*  --  0.28*  --   CREATININE 1.83*  --    < > 1.94* 1.87*  --  2.13*  --    < > = values in this interval not displayed.    Estimated Creatinine Clearance: 44.5 mL/min (A) (by C-G formula based on SCr of 2.13 mg/dL (H)).  Assessment: 54 yo male s/p LVAD HM3 placement 6/24, followed by IABP 7/8-7/13. 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  Heparin level 0.28 at goal range this AM.   Urine with pink-tinge earlier in the week now cleared, Hgb stable   IABP removed yesterday, heparin restarted for LVAD.  Goal of Therapy:  Heparin level goal 0.2-0.5 Monitor platelets by anticoagulation protocol: Yes   Plan:  Continue IV heparin at current rate of 1150 units/hr F/u ability to tolerate po anticoagulation eventually.  [place] Crane, Roylene Reason, BCCP Clinical Pharmacist  08/07/2019 2:52 PM   [facility] pharmacy phone numbers are listed on [email or web address]

## 2019-08-08 ENCOUNTER — Inpatient Hospital Stay (HOSPITAL_COMMUNITY): Payer: BC Managed Care – PPO

## 2019-08-08 LAB — BASIC METABOLIC PANEL
Anion gap: 11 (ref 5–15)
BUN: 99 mg/dL — ABNORMAL HIGH (ref 6–20)
CO2: 25 mmol/L (ref 22–32)
Calcium: 8.7 mg/dL — ABNORMAL LOW (ref 8.9–10.3)
Chloride: 100 mmol/L (ref 98–111)
Creatinine, Ser: 1.95 mg/dL — ABNORMAL HIGH (ref 0.61–1.24)
GFR calc Af Amer: 44 mL/min — ABNORMAL LOW (ref >60)
GFR calc non Af Amer: 38 mL/min — ABNORMAL LOW (ref >60)
Glucose, Bld: 108 mg/dL — ABNORMAL HIGH (ref 70–99)
Potassium: 3.9 mmol/L (ref 3.5–5.1)
Sodium: 136 mmol/L (ref 135–145)

## 2019-08-08 LAB — CBC
HCT: 29.9 % — ABNORMAL LOW (ref 39.0–52.0)
Hemoglobin: 9 g/dL — ABNORMAL LOW (ref 13.0–17.0)
MCH: 27.7 pg (ref 26.0–34.0)
MCHC: 30.1 g/dL (ref 30.0–36.0)
MCV: 92 fL (ref 80.0–100.0)
Platelets: 245 10*3/uL (ref 150–400)
RBC: 3.25 MIL/uL — ABNORMAL LOW (ref 4.22–5.81)
RDW: 18.1 % — ABNORMAL HIGH (ref 11.5–15.5)
WBC: 14 10*3/uL — ABNORMAL HIGH (ref 4.0–10.5)
nRBC: 0 % (ref 0.0–0.2)

## 2019-08-08 LAB — GLUCOSE, CAPILLARY
Glucose-Capillary: 100 mg/dL — ABNORMAL HIGH (ref 70–99)
Glucose-Capillary: 104 mg/dL — ABNORMAL HIGH (ref 70–99)
Glucose-Capillary: 110 mg/dL — ABNORMAL HIGH (ref 70–99)
Glucose-Capillary: 120 mg/dL — ABNORMAL HIGH (ref 70–99)
Glucose-Capillary: 121 mg/dL — ABNORMAL HIGH (ref 70–99)
Glucose-Capillary: 121 mg/dL — ABNORMAL HIGH (ref 70–99)
Glucose-Capillary: 134 mg/dL — ABNORMAL HIGH (ref 70–99)

## 2019-08-08 LAB — COOXEMETRY PANEL
Carboxyhemoglobin: 1.1 % (ref 0.5–1.5)
Methemoglobin: 0.9 % (ref 0.0–1.5)
O2 Saturation: 81.2 %
Total hemoglobin: 9.5 g/dL — ABNORMAL LOW (ref 12.0–16.0)

## 2019-08-08 LAB — MAGNESIUM: Magnesium: 2.3 mg/dL (ref 1.7–2.4)

## 2019-08-08 LAB — PROTIME-INR
INR: 1.3 — ABNORMAL HIGH (ref 0.8–1.2)
Prothrombin Time: 16 seconds — ABNORMAL HIGH (ref 11.4–15.2)

## 2019-08-08 LAB — LACTATE DEHYDROGENASE: LDH: 242 U/L — ABNORMAL HIGH (ref 98–192)

## 2019-08-08 LAB — TRIGLYCERIDES: Triglycerides: 87 mg/dL (ref <150)

## 2019-08-08 LAB — LIDOCAINE LEVEL: Lidocaine Lvl: 2 ug/mL (ref 1.5–5.0)

## 2019-08-08 LAB — HEPARIN LEVEL (UNFRACTIONATED): Heparin Unfractionated: 0.39 IU/mL (ref 0.30–0.70)

## 2019-08-08 MED ORDER — FUROSEMIDE 10 MG/ML IJ SOLN
40.0000 mg | Freq: Once | INTRAMUSCULAR | Status: AC
  Administered 2019-08-08: 40 mg via INTRAVENOUS
  Filled 2019-08-08: qty 4

## 2019-08-08 NOTE — Progress Notes (Signed)
[place] for Heparin Indication: LVAD  Allergies  Allergen Reactions  . Codeine Hives    Patient Measurements: Height: 5\' 9"  (175.3 cm) Weight: 92.8 kg (204 lb 9.4 oz) (with controller) IBW/kg (Calculated) : 70.7 Heparin Dosing Weight: ~ 92 kg  Vital Signs: Temp: 98.2 F (36.8 C) (08/07 0800) Temp Source: Bladder (08/07 0400) BP: 91/76 (08/07 0800) Pulse Rate: 56 (08/07 0800)  Labs: Recent Labs    08/06/19 0456 08/06/19 0458 08/07/19 0528 08/07/19 0808 08/07/19 0808 08/07/19 1546 08/07/19 1946 08/08/19 0310  HGB  --    < >  --  8.9*   < > 9.7*  --  9.0*  HCT  --    < >  --  28.8*  --  31.6*  --  29.9*  PLT  --    < >  --  229  --  277  --  245  LABPROT 14.9  --  15.0  --   --   --   --  16.0*  INR 1.2  --  1.2  --   --   --   --  1.3*  HEPARINUNFRC 0.24*  --  0.28*  --   --   --   --  0.39  CREATININE 1.87*   < > 2.13*  --    < > 2.10* 2.11* 1.95*   < > = values in this interval not displayed.    Estimated Creatinine Clearance: 48.1 mL/min (A) (by C-G formula based on SCr of 1.95 mg/dL (H)).  Assessment: 54 yo male s/p LVAD HM3 placement 6/24, followed by IABP 7/8-7/13. 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  Heparin level 0.39 at goal range this AM.   Urine with pink-tinge earlier in the week now cleared, Hgb stable   IABP removed 8/5, heparin restarted for LVAD Extubated and reintubated 8/6  Goal of Therapy:  Heparin level goal 0.2-0.5 Monitor platelets by anticoagulation protocol: Yes   Plan:  Continue IV heparin at current rate of 1150 units/hr F/u ability to tolerate po anticoagulation eventually.  Bonnita Nasuti Pharm.D. CPP, BCPS Clinical Pharmacist [phone] 08/08/2019 8:59 AM     [facility] pharmacy phone numbers are listed on [email or web address]

## 2019-08-08 NOTE — Progress Notes (Signed)
Progress Note   Subjective   Ill, reintubated.  + VF yesterday  Inpatient Medications    Scheduled Meds:  alteplase  2 mg Intracatheter Once   aspirin  81 mg Per Tube Daily   atorvastatin  80 mg Per Tube Daily   bisacodyl  10 mg Rectal Once   chlorhexidine gluconate (MEDLINE KIT)  15 mL Mouth Rinse BID   Chlorhexidine Gluconate Cloth  6 each Topical Daily   clonazePAM  1 mg Per Tube BID   docusate  100 mg Per Tube BID   feeding supplement (PROSource TF)  90 mL Per Tube BID   folic acid  1 mg Per Tube Daily   free water  20 mL Per Tube Q4H   gabapentin  300 mg Per Tube Q8H   insulin aspart  0-15 Units Subcutaneous Q4H   mouth rinse  15 mL Mouth Rinse 10 times per day   midodrine  15 mg Oral TID WC   multivitamin  15 mL Per Tube Daily   pantoprazole sodium  40 mg Per Tube Daily   polyethylene glycol  17 g Oral Daily   potassium chloride  40 mEq Per Tube BID   sildenafil  20 mg Per Tube TID   sodium chloride flush  10-40 mL Intracatheter Q12H   thiamine  100 mg Per Tube Daily   Continuous Infusions:  sodium chloride     sodium chloride Stopped (08/05/19 1236)   sodium chloride 20 mL/hr at 08/01/19 1700   amiodarone 30 mg/hr (08/08/19 0700)   feeding supplement (VITAL 1.5 CAL) 1,000 mL (08/08/19 0030)   fentaNYL infusion INTRAVENOUS 100 mcg/hr (08/08/19 0700)   heparin 1,150 Units/hr (08/08/19 0700)   lidocaine 1 mg/min (08/08/19 0700)   phenylephrine (NEO-SYNEPHRINE) Adult infusion 25 mcg/min (08/08/19 0700)   propofol (DIPRIVAN) infusion 25 mcg/kg/min (08/08/19 0700)   PRN Meds: Place/Maintain arterial line **AND** sodium chloride, sodium chloride, acetaminophen, fentaNYL, midazolam, sodium chloride flush   Vital Signs    Vitals:   08/08/19 0729 08/08/19 0730 08/08/19 0745 08/08/19 0800  BP: 100/89   91/76  Pulse: (!) 56 (!) 55 (!) 56 (!) 56  Resp: '18 18 18 18  ' Temp:   98.2 F (36.8 C) 98.2 F (36.8 C)  TempSrc:      SpO2:  100% 100% 100% 100%  Weight:      Height:        Intake/Output Summary (Last 24 hours) at 08/08/2019 0849 Last data filed at 08/08/2019 0830 Gross per 24 hour  Intake 2705.96 ml  Output 3265 ml  Net -559.04 ml   Filed Weights   08/06/19 0552 08/07/19 0600 08/08/19 0400  Weight: 96.1 kg 94.4 kg 92.8 kg    Telemetry    Sinus today,  He did have VF yesterday - Personally Reviewed  Physical Exam   GEN- The patient is ill appearing, intubated  Head- normocephalic, atraumatic ETT in place Neck- supple, Lungs-  normal work of breathing Heart- Regular rate and rhythm  GI- soft  Extremities- no clubbing, cyanosis,+ dependant edema  MS- diffuse atrophy Skin- no rash or lesion Psych- sedated Neuro- sedated   Labs    Chemistry Recent Labs  Lab 08/07/19 1546 08/07/19 1946 08/08/19 0310  NA 135 138 136  K 4.3 4.0 3.9  CL 99 99 100  CO2 '22 26 25  ' GLUCOSE 169* 150* 108*  BUN 103* 103* 99*  CREATININE 2.10* 2.11* 1.95*  CALCIUM 8.7* 8.4* 8.7*  PROT 6.4*  --   --  ALBUMIN 2.2*  --   --   AST 87*  --   --   ALT 25  --   --   ALKPHOS 157*  --   --   BILITOT 0.6  --   --   GFRNONAA 34* 34* 38*  GFRAA 40* 40* 44*  ANIONGAP '14 13 11     ' Hematology Recent Labs  Lab 08/07/19 0808 08/07/19 1546 08/08/19 0310  WBC 12.1* 15.5* 14.0*  RBC 3.11* 3.49* 3.25*  HGB 8.9* 9.7* 9.0*  HCT 28.8* 31.6* 29.9*  MCV 92.6 90.5 92.0  MCH 28.6 27.8 27.7  MCHC 30.9 30.7 30.1  RDW 18.2* 18.2* 18.1*  PLT 229 277 245     Assessment & Plan    1.  Refractory ventricular fibrillation Episodes are ongoing despite VAD, IAPB, intubation, lidocaine and amiodarone. We have nearly exhausted efforts.  He has had marked decline over the past month. I have spent quite some time thinking about this patient and searching the literature.  I have spoken at length with Dr Aundra Dubin.   It may be that quinidine would be a reasonable option for him. I have spoken with pharmacy and we will investigate the  availability of quinidine for this patient. Though this is certainly not an ideal treatment for him acutely or chronically, I feel like it may be our only option.  The patients prognosis is very very poor.  If we cannot control his ventricular arrhythmias then he will die. On review of episodes, he does not appear to have PVC initiated VF.  I therefore do not feel that catheter ablation would offer him any benefit.  Critical care time was exclusive of separate billable procedures and treating other patients.  Critial care time was spent personally by me (independant of midlevel providers) on the following activities: development of treatment plan with Dr Aundra Dubin, Bonnita Nasuti  as well as nursing, discussions with sister (at bedside) evaluation of patients response to treatment, examining patient, ordering/ reviewing treatments/ interventions, lab studies, radiographic studies, pulse ox, and re-evaluation of patients condition.     The patient is critically ill with multiple organ systems failure and requires high complexity decision making for assessment and support, frequent evaluation and titration of therapies, application of advanced monitoring technologies and extensive interpretation of databases.   Critical care was necessary to treat or prevent immintent or life-threatening deterioration.    Total CCT spent directly with the patient today is 45 minutes  Thompson Grayer MD, [facility] 08/08/2019 8:54 AM

## 2019-08-08 NOTE — Progress Notes (Signed)
Patient ID: Aaron [name], male   DOB: 1965-11-30, 54 y.o.   MRN: [id number]   Advanced Heart Failure VAD Team Note  PCP-Cardiologist: No primary care provider on file.   Subjective:    - Had refractory VF throughout the night 7/28 w/ ~10 shocks. Now intubated.  - Developed bradycardia and further VF 7/30 am, TTVP + IABP placed 7/30 am.   - 8/1 completed cefepime, switched to ancef. Had VF 08/02/19 shock x1  - 8/3 Echo ramp, increased speed to 5400. VF x3. Failed IABP wean. Speed decreased to 5300.   - 08/05/19 VF shock x1 - 8/5 IABP removed.  VT shock x 3 when sedation weaned and patient agitated.  - 8/6 Extubated, VT with shock x 3, reintubated.   No further VT overnight.  Remains on amiodarone 30 and lidocaine 1, lidocaine level 2.   Co-ox 81% today, CVP 8-9.  He is on phenylephrine 25 with midodrine 15 tid, MAP in 70s.   Lasix held yesterday with rise in creatinine, I/Os even.  Creatinine 2.11 => 1.95.   Tm 100.8 yesterday pm, afebrile this morning.  Currently off abx.     LVAD INTERROGATION:  HeartMate 3 LVAD:   Flow 4.7 liters/min, speed 5300, power 3.8 PI 3.3   1 PI event.   Objective:    Vital Signs:   Temp:  [97.9 F (36.6 C)-101.1 F (38.4 C)] 98.2 F (36.8 C) (08/07 0800) Pulse Rate:  [32-191] 56 (08/07 0800) Resp:  [9-33] 18 (08/07 0800) BP: (74-104)/(54-92) 91/76 (08/07 0800) SpO2:  [84 %-100 %] 100 % (08/07 0800) Arterial Line BP: (73-101)/(59-85) 84/73 (08/07 0800) FiO2 (%):  [70 %-100 %] 100 % (08/07 0730) Weight:  [92.8 kg] 92.8 kg (08/07 0400) Last BM Date: 08/08/19 Mean arterial Pressure 70s   Intake/Output:   Intake/Output Summary (Last 24 hours) at 08/08/2019 0851 Last data filed at 08/08/2019 0830 Gross per 24 hour  Intake 2705.96 ml  Output 3265 ml  Net -559.04 ml     Physical Exam  CVP 8-9 General: Intubated/sedated HEENT: Normal. Neck: Supple, JVP 8 cm. Carotids OK.  Cardiac:  Mechanical heart sounds with LVAD hum present.  Lungs:  CTAB,  normal effort.  Abdomen:  NT, ND, no HSM. No bruits or masses. +BS  LVAD exit site: Well-healed and incorporated. Dressing dry and intact. No erythema or drainage. Stabilization device present and accurately applied. Driveline dressing changed daily per sterile technique. Extremities:  Warm and dry. No cyanosis, clubbing, rash. 1+ ankle edema.   Neuro:  Opens eyes but sedated.    Telemetry   NSR 50s. No VT/VF over night  Labs   Basic Metabolic Panel: Recent Labs  Lab 08/05/19 0441 08/05/19 0441 08/05/19 1644 08/05/19 1644 08/05/19 1749 08/05/19 1749 08/06/19 0456 08/06/19 0456 08/07/19 0528 08/07/19 0528 08/07/19 1546 08/07/19 1946 08/08/19 0310  NA 136   < > 135   < > 136   < > 137  --  139  --  135 138 136  K 3.6   < > 5.2*   < > 4.7   < > 4.4  --  4.3  --  4.3 4.0 3.9  CL 103   < > 101   < > 101   < > 102  --  99  --  99 99 100  CO2 20*   < > 24   < > 23   < > 21*  --  26  --  22 26 25  GLUCOSE 168*   < > 151*   < > 133*   < > 172*  --  137*  --  169* 150* 108*  BUN 77*   < > 88*   < > 84*   < > 87*  --  100*  --  103* 103* 99*  CREATININE 1.83*   < > 1.91*   < > 1.94*   < > 1.87*  --  2.13*  --  2.10* 2.11* 1.95*  CALCIUM 7.7*   < > 8.1*   < > 8.2*   < > 7.8*   < > 8.7*   < > 8.7* 8.4* 8.7*  MG 2.0  --   --   --   --   --  2.1  --  2.0  --  2.0  --  2.3  PHOS  --   --  4.2  --  4.1  --   --   --   --   --  4.7*  --   --    < > = values in this interval not displayed.    Liver Function Tests: Recent Labs  Lab 08/07/19 1546  AST 87*  ALT 25  ALKPHOS 157*  BILITOT 0.6  PROT 6.4*  ALBUMIN 2.2*   No results for input(s): LIPASE, AMYLASE in the last 168 hours. No results for input(s): AMMONIA in the last 168 hours.  CBC: Recent Labs  Lab 08/05/19 0441 08/06/19 0458 08/07/19 0808 08/07/19 1546 08/08/19 0310  WBC 8.8 10.9* 12.1* 15.5* 14.0*  NEUTROABS  --   --   --  13.2*  --   HGB 8.4* 8.8* 8.9* 9.7* 9.0*  HCT 28.2* 29.3* 28.8* 31.6* 29.9*  MCV 94.0  94.5 92.6 90.5 92.0  PLT 186 206 229 277 245    INR: Recent Labs  Lab 08/04/19 0432 08/05/19 0441 08/06/19 0456 08/07/19 0528 08/08/19 0310  INR 1.3* 1.3* 1.2 1.2 1.3*    Other results:    Imaging   DG Chest 1 View  Result Date: 08/07/2019 CLINICAL DATA:  Intubation EXAM: CHEST  1 VIEW COMPARISON:  A 03/2019 FINDINGS: The endotracheal tube terminates above the carina. The right-sided PICC line is stable in positioning. There is an LVAD in place. The heart size remains enlarged. There are moderate to large bilateral pleural effusions, right greater than left. Diffuse bilateral hazy airspace opacities with prominent interstitial lung markings are again noted. IMPRESSION: 1. Lines and tubes as above. 2. Persistent findings of congestive heart failure including bilateral pleural effusions, right greater than left. Electronically Signed   By: Constance Holster M.D.   On: 08/07/2019 16:31   DG Abd Portable 1V  Result Date: 08/07/2019 CLINICAL DATA:  OG tube placement EXAM: PORTABLE ABDOMEN - 1 VIEW COMPARISON:  08/04/2019 FINDINGS: The OG tube projects over the patient's stomach. There is an LVAD in place. The bowel gas pattern is nonspecific. There is gaseous distention of the colon. IMPRESSION: OG tube projects over the patient's stomach. Electronically Signed   By: Constance Holster M.D.   On: 08/07/2019 18:29     Medications:     Scheduled Medications:  alteplase  2 mg Intracatheter Once   aspirin  81 mg Per Tube Daily   atorvastatin  80 mg Per Tube Daily   bisacodyl  10 mg Rectal Once   chlorhexidine gluconate (MEDLINE KIT)  15 mL Mouth Rinse BID   Chlorhexidine Gluconate Cloth  6 each Topical Daily   clonazePAM  1 mg Per Tube BID   docusate  100 mg Per Tube BID   feeding supplement (PROSource TF)  90 mL Per Tube BID   folic acid  1 mg Per Tube Daily   free water  20 mL Per Tube Q4H   furosemide  40 mg Intravenous Once   gabapentin  300 mg Per Tube Q8H    insulin aspart  0-15 Units Subcutaneous Q4H   mouth rinse  15 mL Mouth Rinse 10 times per day   midodrine  15 mg Oral TID WC   multivitamin  15 mL Per Tube Daily   pantoprazole sodium  40 mg Per Tube Daily   polyethylene glycol  17 g Oral Daily   potassium chloride  40 mEq Per Tube BID   sildenafil  20 mg Per Tube TID   sodium chloride flush  10-40 mL Intracatheter Q12H   thiamine  100 mg Per Tube Daily    Infusions:  sodium chloride     sodium chloride Stopped (08/05/19 1236)   sodium chloride 20 mL/hr at 08/01/19 1700   amiodarone 30 mg/hr (08/08/19 0700)   feeding supplement (VITAL 1.5 CAL) 1,000 mL (08/08/19 0030)   fentaNYL infusion INTRAVENOUS 100 mcg/hr (08/08/19 0700)   heparin 1,150 Units/hr (08/08/19 0700)   lidocaine 1 mg/min (08/08/19 0700)   phenylephrine (NEO-SYNEPHRINE) Adult infusion 25 mcg/min (08/08/19 0700)   propofol (DIPRIVAN) infusion 25 mcg/kg/min (08/08/19 0700)    PRN Medications: Place/Maintain arterial line **AND** sodium chloride, sodium chloride, acetaminophen, fentaNYL, midazolam, sodium chloride flush   Assessment/Plan:    1. Refractory Ventricular Fibrillation  -multiple episodes ~10 of VF on 7/28 - multiple episodes 7/30 am => IABP placed as well as TTVP given relative bradycardia.  IABP and TTVP now out.  Continues to have VF 7/30 , 7/31, 8/1, 8/3, 8/4, 8/6.    - Ranexa and Mexiletine currently on hold (unable to crush and administer through tube). Plan to resume Ranexa 500 mg bid + Mexiletine 200 mg bid if we can extubate.  - Continue lidocaine gtt at 1, level ok today (follow daily).   - Continue amiodarone gtt at 30 mg/hr.   - Sotalol not a good option given his history of bradycardia.  - Keep K > 4.0 and Mg > 2.0. - Unable to tolerate extubation on 8/6 due to VT => occurs when he wakes up, presumably with stress.  Stable currently as intubated and sedated.  - Discussed again with Dr. Rayann Heman this morning, can we get  quinidine?   2. Acute Systolic Heart Failure s/p VAD - EF 84%, RV systolic function mildly reduced - HMIII LVAD placed 06/25/19.  - RV looks good on echo, cannula ok  - Co-ox 81% - avoid inotropes with VF, remains on low dose phenylephrine and midodrine 15 mg tid, should be able to titrate off phenylephrine today.  - Off digoxin with bradycardia/AKI.  - Continue Heparin gtt, off warfarin currently. LDH stable.  - Creatinine lower today, CVP 8-9. Will give dose of Lasix 40 mg IV x 1.   3. Acute hypoxic resp failure - intubated in setting of VF - vent management per PCCM  - CXR with somewhat improved pulmonary edema and pleural effusions.  FiO2 decreased to 0.5 this morning.   4. CAD - S/p Acute STEMI 6/21.Emergent cath showed severe multivessel disease w/ 75% mid- distal LM, 100% prox LAD, not amendable to PCI (unable to cross w/ guide wire, probable CTO), 100% prox LCx (probable CTO) and  100% ostial RCA (probable CTO). There were mildto moderate collaterals right to left to the LAD, minimal right to right collateralsan minimalleft to left collaterals.LVEF 25% -Hs trop peaked 27,000. - No targets for CABG / PCI.  - S/P HMIII VAD - Continue ASA + statin   5. Renal mass = RCC - MRI abdomen- 1.8 cm right renal lesion, most consistent with renal cell carcinoma. No evidence of renal vein involvement or nodal metastasis. - Discussed w/ Urology and R RCC is small and will just require surveillance. Ok to proceed with VAD from their standpoint.   6. Urinary retention - Foley   7. ID:  - ?HCAP => Klebsiella in trach aspirate from 7/29.  - Completed cefepime 08/02/19, switched to ancef (stopped 8/3) - Blood CX repeated 08/04/19, NGTD - Tm 100.8 yesterday, afebrile currently.  Discussed with CCM, no abx for now.   8. Anemia  - Hgb 9  - No obvious source of bleeding.  - Repeat CBC in am. Transfuse Hgb < 7.5   9. AKI - Creatinine 1.87>>2.13>>1.99 - Will give a dose of Lasix  today.   We are not making progress at this point. Any time sedation/vent weaned, further VT.  No obvious good options, we are going to see if quinidine would be available as last effort. Discussed situation with sister today.   CRITICAL CARE Performed by: Loralie Champagne  Total critical care time: 40 minutes  Critical care time was exclusive of separately billable procedures and treating other patients.  Critical care was necessary to treat or prevent imminent or life-threatening deterioration.  Critical care was time spent personally by me on the following activities: development of treatment plan with patient and/or surrogate as well as nursing, discussions with consultants, evaluation of patient's response to treatment, examination of patient, obtaining history from patient or surrogate, ordering and performing treatments and interventions, ordering and review of laboratory studies, ordering and review of radiographic studies, pulse oximetry and re-evaluation of patient's condition.  Loralie Champagne 08/08/2019 8:51 AM

## 2019-08-08 NOTE — Progress Notes (Signed)
NAME:  Aaron [name], MRN:  [id number], DOB:  Mar 11, 1965, LOS: 39 ADMISSION DATE:  07/27/2019, CONSULTATION DATE:  07/30/19 REFERRING MD:  Heart Failure, CHIEF COMPLAINT:  Respiratory failure -- VF, LVAD   Brief History   54 yo PMH acute systolic heart failure s/p LVAD (06/25/19), recurrent VT/VF/Torsades, multivessel CAD, R renal mass c/f RCC, suspected OSA, who presents back to ICU after prolonged hospital course after which he was transferred to [facility] 7/23. At CIR, pt developed dyspnea, hypoxia following PT 7/27. ECG obtained and reveals VF rate 210 bpm. Transferred to CVICU 7/27 for emergent DCCV. Intubated by anesthesia. DCCV performed numerous times 7/27, 7/28. Remains intubated requiring frequent cardioversion.  PCCM consulted 7/29 for vent management   Past Medical History  Acute systolic heart failure s/p LVAD CAD Recurrent VF, VT, torsades  Renal mass Debility Sleep apnea   Significant Hospital Events   6/16 admitted with acute anterolateral STEMI, cardiogenic shock requiring IABP 6/25 LVAD placement 7/03 VT/VT/Torsades requiring multiple emergent DCCV  7/06 recurrent VT/VT/Torsades requiring multiple emergent DCCV  7/07 recurrent VT/VT/Torsades requiring multiple emergent DCCV  7/08 PNA, respiratory failure, required intubation 7/13 IABP removed, extubated 7/23 discharred to CIR 7/27 recurrent VF, admit to CVICU for DCCV. Intubated 7/28 recurrent VF, DCCV multiple times 7/29 PCCM consult for vent  7/30 vt overnight defib x2 7/31 DCCV x2, improved on versed / no further shocks 7/31>> present, intermittent vtach/shocks correlating with agitation 8/6 failed extubation trial  Consults:  EP PCCM  Procedures:  ETT 7/27 >> Art line 7/28 >> RUE PICC 6/28 >>  R Femoral Venous Sheath 7/30 >>  IABP 7/30 >>   Significant Diagnostic Tests:    Micro Data:  See micro summary in epic  Antimicrobials:  See fever summary in epice  Interim history/subjective:  Failed  extubation trial. Back on vent, sedated.  Objective   Blood pressure 100/89, pulse (!) 56, temperature 98.2 F (36.8 C), resp. rate 18, height 5\' 9"  (1.753 m), weight 92.8 kg, SpO2 100 %. CVP:  [0 mmHg-34 mmHg] 9 mmHg  Vent Mode: PRVC FiO2 (%):  [40 %-100 %] 100 % Set Rate:  [18 bmp] 18 bmp Vt Set:  [560 mL] 560 mL PEEP:  [5 cmH20-6 cmH20] 5 cmH20 Pressure Support:  [6 cmH20] 6 cmH20 Plateau Pressure:  [18 cmH20-28 cmH20] 24 cmH20   Intake/Output Summary (Last 24 hours) at 08/08/2019 0759 Last data filed at 08/08/2019 0700 Gross per 24 hour  Intake 2895.64 ml  Output 2925 ml  Net -29.36 ml   Filed Weights   08/06/19 0552 08/07/19 0600 08/08/19 0400  Weight: 96.1 kg 94.4 kg 92.8 kg    Examination: GEN: no acute distress on vent HEENT: ETT in place, minimal secretions CV: LVAD hum, ext warm PULM: rhonci bilaterally, triggering vent GI: +BS, rectal tube in place EXT: muscle wasting, trace edema NEURO: too sedated to follow commands this am PSYCH: RASS -3 SKIN: No rashes  SvO2 81% K/Mg look good BUN/Cr stable CBC stable CBG okay Net even  Resolved Hospital Problem list   Shock  Sepsis due to UTI, Klebsiella Pneumoniae PNA-cefazolin, complete 7 days total (8/3 end date)  Assessment & Plan:   Acute Hypoxic Respiratory Failure in setting of Recurrent VF/VT, volume overloaded state of heart -PRVC 8cc/kg -VAP prevention measures  -Running out of options, not sure trach is going to fix his recurrent vtach, will likely require multidisciplinary discussion Monday  Sedation Needs for Mechanical Ventilation  -PAD protocol with fentanyl + propofol  gtt for RASS Goal -1 to -2  Recurrent VFib/Vtach requiring repeated DCCV -Cardiology / EP following  -continue lidocaine, amiodarone, digoxin -optimize electrolytes, goal K>4, Mg>2  Acute systolic heart failure s/p LVAD placement (06/25/19) -continue supportive measures, diuresis as tolerated by renal function   -diuretic,  inotrope management per primary  Coagulopathy, on warfarin PTA -heparin per pharmacy, goal level 0.2-0.4  At Risk Malnutrition in setting of Critical Illness -continue TF   Best practice:  Diet: TF  Pain/Anxiety/Delirium protocol (if indicated): Fentanyl + propofol gtt, PO klonipin VAP protocol (if indicated): yes DVT prophylaxis: heparin per pharmacy  GI prophylaxis: protonix  Glucose control: SSI Mobility: BR Code Status: Full  Family Communication: Per primary Disposition: ICU   The patient is critically ill with multiple organ systems failure and requires high complexity decision making for assessment and support, frequent evaluation and titration of therapies, application of advanced monitoring technologies and extensive interpretation of multiple databases. Critical Care Time devoted to patient care services described in this note independent of APP/resident time (if applicable)  is 31 minutes.   Erskine Emery MD [facility] 08/08/2019 7:59 AM Personal pager: [phone] If unanswered, please page CCM On-call: [phone]

## 2019-08-08 NOTE — Plan of Care (Signed)
  Problem: Cardiac: Goal: Ability to achieve and maintain adequate cardiopulmonary perfusion will improve Outcome: Progressing   Problem: Clinical Measurements: Goal: Will remain free from infection Outcome: Progressing Goal: Respiratory complications will improve Outcome: Progressing   Problem: Nutrition: Goal: Adequate nutrition will be maintained Outcome: Progressing   Problem: Elimination: Goal: Will not experience complications related to bowel motility Outcome: Progressing Goal: Will not experience complications related to urinary retention Outcome: Progressing

## 2019-08-09 ENCOUNTER — Inpatient Hospital Stay (HOSPITAL_COMMUNITY): Payer: BC Managed Care – PPO

## 2019-08-09 LAB — BASIC METABOLIC PANEL
Anion gap: 13 (ref 5–15)
BUN: 85 mg/dL — ABNORMAL HIGH (ref 6–20)
CO2: 26 mmol/L (ref 22–32)
Calcium: 8.5 mg/dL — ABNORMAL LOW (ref 8.9–10.3)
Chloride: 99 mmol/L (ref 98–111)
Creatinine, Ser: 1.61 mg/dL — ABNORMAL HIGH (ref 0.61–1.24)
GFR calc Af Amer: 55 mL/min — ABNORMAL LOW (ref >60)
GFR calc non Af Amer: 47 mL/min — ABNORMAL LOW (ref >60)
Glucose, Bld: 220 mg/dL — ABNORMAL HIGH (ref 70–99)
Potassium: 4.1 mmol/L (ref 3.5–5.1)
Sodium: 138 mmol/L (ref 135–145)

## 2019-08-09 LAB — CBC
HCT: 29.9 % — ABNORMAL LOW (ref 39.0–52.0)
Hemoglobin: 9.1 g/dL — ABNORMAL LOW (ref 13.0–17.0)
MCH: 28.4 pg (ref 26.0–34.0)
MCHC: 30.4 g/dL (ref 30.0–36.0)
MCV: 93.4 fL (ref 80.0–100.0)
Platelets: 237 10*3/uL (ref 150–400)
RBC: 3.2 MIL/uL — ABNORMAL LOW (ref 4.22–5.81)
RDW: 18.3 % — ABNORMAL HIGH (ref 11.5–15.5)
WBC: 11.1 10*3/uL — ABNORMAL HIGH (ref 4.0–10.5)
nRBC: 0.2 % (ref 0.0–0.2)

## 2019-08-09 LAB — CULTURE, BLOOD (ROUTINE X 2)
Culture: NO GROWTH
Culture: NO GROWTH
Special Requests: ADEQUATE
Special Requests: ADEQUATE

## 2019-08-09 LAB — MAGNESIUM: Magnesium: 2 mg/dL (ref 1.7–2.4)

## 2019-08-09 LAB — COOXEMETRY PANEL
Carboxyhemoglobin: 1.8 % — ABNORMAL HIGH (ref 0.5–1.5)
Methemoglobin: 1.4 % (ref 0.0–1.5)
O2 Saturation: 67.5 %
Total hemoglobin: 9.5 g/dL — ABNORMAL LOW (ref 12.0–16.0)

## 2019-08-09 LAB — LACTATE DEHYDROGENASE: LDH: 230 U/L — ABNORMAL HIGH (ref 98–192)

## 2019-08-09 LAB — GLUCOSE, CAPILLARY
Glucose-Capillary: 198 mg/dL — ABNORMAL HIGH (ref 70–99)
Glucose-Capillary: 129 mg/dL — ABNORMAL HIGH (ref 70–99)
Glucose-Capillary: 185 mg/dL — ABNORMAL HIGH (ref 70–99)
Glucose-Capillary: 128 mg/dL — ABNORMAL HIGH (ref 70–99)
Glucose-Capillary: 130 mg/dL — ABNORMAL HIGH (ref 70–99)

## 2019-08-09 LAB — LIDOCAINE LEVEL: Lidocaine Lvl: >30 ug/mL — ABNORMAL HIGH (ref 1.5–5.0)

## 2019-08-09 LAB — PROCALCITONIN: Procalcitonin: 0.25 ng/mL

## 2019-08-09 LAB — HEPARIN LEVEL (UNFRACTIONATED): Heparin Unfractionated: 0.3 IU/mL (ref 0.30–0.70)

## 2019-08-09 MED ORDER — INSULIN ASPART 100 UNIT/ML ~~LOC~~ SOLN
3.0000 [IU] | SUBCUTANEOUS | Status: DC
  Administered 2019-08-09 – 2019-08-10 (×8): 3 [IU] via SUBCUTANEOUS

## 2019-08-09 MED ORDER — PIPERACILLIN-TAZOBACTAM 3.375 G IVPB
3.3750 g | Freq: Three times a day (TID) | INTRAVENOUS | Status: DC
  Administered 2019-08-09 – 2019-08-12 (×8): 3.375 g via INTRAVENOUS
  Filled 2019-08-09 (×8): qty 50

## 2019-08-09 MED ORDER — FUROSEMIDE 10 MG/ML IJ SOLN
40.0000 mg | Freq: Two times a day (BID) | INTRAMUSCULAR | Status: DC
  Administered 2019-08-09 – 2019-08-13 (×9): 40 mg via INTRAVENOUS
  Filled 2019-08-09 (×9): qty 4

## 2019-08-09 MED ORDER — MIDODRINE HCL 5 MG PO TABS
15.0000 mg | ORAL_TABLET | Freq: Three times a day (TID) | ORAL | Status: DC
  Administered 2019-08-09 – 2019-08-19 (×33): 15 mg
  Filled 2019-08-09 (×32): qty 3

## 2019-08-09 MED ORDER — QUINIDINE SULFATE 300 MG PO TABS
150.0000 mg | ORAL_TABLET | Freq: Four times a day (QID) | ORAL | Status: DC
  Administered 2019-08-09 – 2019-08-20 (×45): 150 mg
  Filled 2019-08-09 (×50): qty 1

## 2019-08-09 NOTE — Progress Notes (Signed)
Pharmacy Antibiotic Note  Aaron [name] is a 54 y.o. male admitted on 07/18/2019 with aspiration pneumonia.  Pharmacy has been consulted for Zosyn dosing.  Plan: Zosyn 3.375g IV q 8hrs (extended interval infusion)  Height: 5\' 9"  (175.3 cm) Weight: 92.1 kg (203 lb 0.7 oz) IBW/kg (Calculated) : 70.7  Temp (24hrs), Avg:99.7 F (37.6 C), Min:98.2 F (36.8 C), Max:100.4 F (38 C)  Recent Labs  Lab 08/06/19 0456 08/06/19 0458 08/07/19 0528 08/07/19 0808 08/07/19 1546 08/07/19 1946 08/08/19 0310 08/09/19 0340  WBC  --  10.9*  --  12.1* 15.5*  --  14.0* 11.1*  CREATININE   < >  --  2.13*  --  2.10* 2.11* 1.95* 1.61*   < > = values in this interval not displayed.    Estimated Creatinine Clearance: 58.1 mL/min (A) (by C-G formula based on SCr of 1.61 mg/dL (H)).    Allergies  Allergen Reactions   Codeine Hives    Antimicrobials this admission: Cefepime 7/28>>8/1 Vanc 7/28>7/29 Cefazolin 8/1> 8/3  Dose adjustments this admission:  Microbiology results: 7/28 bldx2 - neg 7/28 urine - ng 7/29 TA: kleb pneumo - S ancef 8/3 BCx x 2: neg 8/8 Resp Cx: pending  Thank you for allowing pharmacy to be a part of this patients care.  [place] Crane, Roylene Reason, BCCP Clinical Pharmacist  08/09/2019 6:11 PM   [facility] pharmacy phone numbers are listed on [email or web address]

## 2019-08-09 NOTE — Progress Notes (Signed)
Progress Note   Subjective   intubated  Inpatient Medications    Scheduled Meds: . alteplase  2 mg Intracatheter Once  . aspirin  81 mg Per Tube Daily  . atorvastatin  80 mg Per Tube Daily  . bisacodyl  10 mg Rectal Once  . chlorhexidine gluconate (MEDLINE KIT)  15 mL Mouth Rinse BID  . Chlorhexidine Gluconate Cloth  6 each Topical Daily  . clonazePAM  1 mg Per Tube BID  . docusate  100 mg Per Tube BID  . feeding supplement (PROSource TF)  90 mL Per Tube BID  . folic acid  1 mg Per Tube Daily  . free water  20 mL Per Tube Q4H  . furosemide  40 mg Intravenous BID  . gabapentin  300 mg Per Tube Q8H  . insulin aspart  0-15 Units Subcutaneous Q4H  . insulin aspart  3 Units Subcutaneous Q4H  . mouth rinse  15 mL Mouth Rinse 10 times per day  . midodrine  15 mg Per Tube TID WC  . multivitamin  15 mL Per Tube Daily  . pantoprazole sodium  40 mg Per Tube Daily  . polyethylene glycol  17 g Oral Daily  . potassium chloride  40 mEq Per Tube BID  . quiNIDine sulfate  150 mg Per Tube Q6H  . sildenafil  20 mg Per Tube TID  . sodium chloride flush  10-40 mL Intracatheter Q12H  . thiamine  100 mg Per Tube Daily   Continuous Infusions: . sodium chloride    . sodium chloride Stopped (08/05/19 1236)  . sodium chloride 20 mL/hr at 08/01/19 1700  . amiodarone 30 mg/hr (08/09/19 0600)  . feeding supplement (VITAL 1.5 CAL) 1,000 mL (08/09/19 0826)  . fentaNYL infusion INTRAVENOUS 125 mcg/hr (08/09/19 0600)  . heparin 1,150 Units/hr (08/09/19 0600)  . lidocaine 1 mg/min (08/09/19 0600)  . phenylephrine (NEO-SYNEPHRINE) Adult infusion 10 mcg/min (08/08/19 1000)  . propofol (DIPRIVAN) infusion 25 mcg/kg/min (08/09/19 0600)   PRN Meds: Place/Maintain arterial line **AND** sodium chloride, sodium chloride, acetaminophen, fentaNYL, midazolam, sodium chloride flush   Vital Signs    Vitals:   08/09/19 0600 08/09/19 0717 08/09/19 0720 08/09/19 0730  BP: 94/72 91/78    Pulse: 67 68  67    Resp: (!) 22 (!) 25  20  Temp: 99.9 F (37.7 C) 100.2 F (37.9 C)  100.2 F (37.9 C)  TempSrc:      SpO2: (!) 89% (!) 89% 94% (!) 87%  Weight:      Height:        Intake/Output Summary (Last 24 hours) at 08/09/2019 0913 Last data filed at 08/09/2019 0600 Gross per 24 hour  Intake 2237.73 ml  Output 3380 ml  Net -1142.27 ml   Filed Weights   08/07/19 0600 08/08/19 0400 08/09/19 0500  Weight: 94.4 kg 92.8 kg 92.1 kg    Telemetry    sinus - Personally Reviewed  Physical Exam   GEN- The patient is ill appearing, intubated Head- normocephalic, atraumatic ETT in place Neck- supple, Lungs- on vent Heart- Regular rate and rhythm  GI- soft  Extremities-+ dependant edema  Psych- sedated Neuro- sedated   Labs    Chemistry Recent Labs  Lab 08/07/19 1546 08/07/19 1546 08/07/19 1946 08/08/19 0310 08/09/19 0340  NA 135   < > 138 136 138  K 4.3   < > 4.0 3.9 4.1  CL 99   < > 99 100 99  CO2 22   < >  _0 GLUCOSE 169*   < > 150* 108* 220*  BUN 103*   < > 103* 99* 85*  CREATININE 2.10*   < > 2.11* 1.95* 1.61*  CALCIUM 8.7*   < > 8.4* 8.7* 8.5*  PROT 6.4*  --   --   --   --   ALBUMIN 2.2*  --   --   --   --   AST 87*  --   --   --   --   ALT 25  --   --   --   --   ALKPHOS 157*  --   --   --   --   BILITOT 0.6  --   --   --   --   GFRNONAA 34*   < > 34* 38* 47*  GFRAA 40*   < > 40* 44* 55*  ANIONGAP 14   < > _1 < > = values in this interval not displayed.     Hematology Recent Labs  Lab 08/07/19 1546 08/08/19 0310 08/09/19 0340  WBC 15.5* 14.0* 11.1*  RBC 3.49* 3.25* 3.20*  HGB 9.7* 9.0* 9.1*  HCT 31.6* 29.9* 29.9*  MCV 90.5 92.0 93.4  MCH 27.8 27.7 28.4  MCHC 30.7 30.1 30.4  RDW 18.2* 18.1* 18.3*  PLT 277 245 237   Assessment & Plan    1.  Refractory VF We will try quinidine at this time. Give 196m q6h to start and follow PR (currently 280 msec) and QT (currently 470 msec) closely.  We can titrate medicine as ekg parameters  allow Stop lidocaine Though quinidine is not an ideal option for him, I feel like he is declining quickly and warrants a different AAD approach. Continue IV amiodarone BP does not allow beta blocker titration at this time Dr MAundra Dubin LBonnita Nasuti and I have discussed at length yesterday and again this am.  EP to continue to follow closely with you  Critical care time was exclusive of separate billable procedures and treating other patients.  Critial care time was spent personally by me (independant of midlevel providers) on the following activities: development of treatment plan with Dr Mclean/ LErasmo Downeras well as nursing, evaluation of patients response to treatment, examining patient,  ordering/ reviewing treatments/ interventions, lab studies, radiographic studies, pulse ox, and re-evaluation of patients condition.     The patient is critically ill with multiple organ systems failure and requires high complexity decision making for assessment and support, frequent evaluation and titration of therapies, application of advanced monitoring technologies and extensive interpretation of databases.   Critical care was necessary to treat or prevent immintent or life-threatening deterioration.    Total CCT spent directly with the patient today is 45 minutes  JThompson GrayerMD, F[facility]8/08/2019 9:16 AM     JThompson GrayerMD, FS[facility]8/08/2019 9:13 AM

## 2019-08-09 NOTE — Progress Notes (Signed)
Patient ID: Aaron [name], male   DOB: 1965/08/08, 54 y.o.   MRN: [id number]   Advanced Heart Failure VAD Team Note  PCP-Cardiologist: No primary care provider on file.   Subjective:    - Had refractory VF throughout the night 7/28 w/ ~10 shocks. Now intubated.  - Developed bradycardia and further VF 7/30 am, TTVP + IABP placed 7/30 am.   - 8/1 completed cefepime, switched to ancef. Had VF 08/02/19 shock x1  - 8/3 Echo ramp, increased speed to 5400. VF x3. Failed IABP wean. Speed decreased to 5300.   - 08/05/19 VF shock x1 - 8/5 IABP removed.  VT shock x 3 when sedation weaned and patient agitated.  - 8/6 Extubated, VT with shock x 3, reintubated.   No further VT 8/7-8/8.  Remains on amiodarone 30 and lidocaine 1, lidocaine level 2 yesterday.  Sedated on Propofol/Fentanyl.   Co-ox 68% today, CVP 15.  He is off phenylephrine, on midodrine 15 tid, MAP stable.  I/Os negative yesterday.  Creatinine 2.11 => 1.95 => 1.6.   Tm 100.2, WBCs 11.  Currently off abx.     LVAD INTERROGATION:  HeartMate 3 LVAD:   Flow 4.6 liters/min, speed 5300, power 3.8, PI 3.2.  No PI events.   Objective:    Vital Signs:   Temp:  [98.2 F (36.8 C)-100.2 F (37.9 C)] 100.2 F (37.9 C) (08/08 0730) Pulse Rate:  [54-68] 67 (08/08 0730) Resp:  [0-25] 20 (08/08 0730) BP: (82-99)/(65-79) 91/78 (08/08 0717) SpO2:  [87 %-100 %] 87 % (08/08 0730) Arterial Line BP: (70-89)/(56-77) 85/75 (08/08 0730) FiO2 (%):  [50 %] 50 % (08/08 0720) Weight:  [92.1 kg] 92.1 kg (08/08 0500) Last BM Date: 08/08/19 Mean arterial Pressure 70s   Intake/Output:   Intake/Output Summary (Last 24 hours) at 08/09/2019 0912 Last data filed at 08/09/2019 0600 Gross per 24 hour  Intake 2237.73 ml  Output 3380 ml  Net -1142.27 ml     Physical Exam  CVP 15 General: Sedated on vent.  HEENT: Normal. Neck: Supple, JVP 12 cm. Carotids OK.  Cardiac:  Mechanical heart sounds with LVAD hum present.  Lungs:  CTAB, normal effort.  Abdomen:   NT, ND, no HSM. No bruits or masses. +BS  LVAD exit site: Well-healed and incorporated. Dressing dry and intact. No erythema or drainage. Stabilization device present and accurately applied. Driveline dressing changed daily per sterile technique. Extremities:  Warm and dry. No cyanosis, clubbing, rash.  1+ ankle edema.  Neuro:  Sedated on vent.  Telemetry   NSR 60s. No VT/VF over night (personally reviewed)  Labs   Basic Metabolic Panel: Recent Labs  Lab 08/05/19 1644 08/05/19 1644 08/05/19 1749 08/05/19 1749 08/06/19 0456 08/06/19 0456 08/07/19 0528 08/07/19 0528 08/07/19 1546 08/07/19 1546 08/07/19 1946 08/08/19 0310 08/09/19 0340  NA 135   < > 136   < > 137   < > 139  --  135  --  138 136 138  K 5.2*   < > 4.7   < > 4.4   < > 4.3  --  4.3  --  4.0 3.9 4.1  CL 101   < > 101   < > 102   < > 99  --  99  --  99 100 99  CO2 24   < > 23   < > 21*   < > 26  --  22  --  _0 GLUCOSE 151*   < >  133*   < > 172*   < > 137*  --  169*  --  150* 108* 220*  BUN 88*   < > 84*   < > 87*   < > 100*  --  103*  --  103* 99* 85*  CREATININE 1.91*   < > 1.94*   < > 1.87*   < > 2.13*  --  2.10*  --  2.11* 1.95* 1.61*  CALCIUM 8.1*   < > 8.2*   < > 7.8*   < > 8.7*   < > 8.7*   < > 8.4* 8.7* 8.5*  MG  --   --   --   --  2.1  --  2.0  --  2.0  --   --  2.3 2.0  PHOS 4.2  --  4.1  --   --   --   --   --  4.7*  --   --   --   --    < > = values in this interval not displayed.    Liver Function Tests: Recent Labs  Lab 08/07/19 1546  AST 87*  ALT 25  ALKPHOS 157*  BILITOT 0.6  PROT 6.4*  ALBUMIN 2.2*   No results for input(s): LIPASE, AMYLASE in the last 168 hours. No results for input(s): AMMONIA in the last 168 hours.  CBC: Recent Labs  Lab 08/06/19 0458 08/07/19 0808 08/07/19 1546 08/08/19 0310 08/09/19 0340  WBC 10.9* 12.1* 15.5* 14.0* 11.1*  NEUTROABS  --   --  13.2*  --   --   HGB 8.8* 8.9* 9.7* 9.0* 9.1*  HCT 29.3* 28.8* 31.6* 29.9* 29.9*  MCV 94.5 92.6 90.5 92.0  93.4  PLT 206 229 277 245 237    INR: Recent Labs  Lab 08/04/19 0432 08/05/19 0441 08/06/19 0456 08/07/19 0528 08/08/19 0310  INR 1.3* 1.3* 1.2 1.2 1.3*    Other results:    Imaging   DG Chest 1 View  Result Date: 08/09/2019 CLINICAL DATA:  ARDS EXAM: CHEST  1 VIEW COMPARISON:  08/08/2019 FINDINGS: Endotracheal tube terminates 2.7 cm above the carina. Enteric tube terminates in the proximal gastric body. Right arm PICC terminates at the cavoatrial junction. Cardiomegaly.  LVAD. Defibrillator pads overlying the left hemithorax. Pulmonary vascular congestion with possible mild interstitial edema. Patchy bilateral lower lobe opacities, right greater than left. Possible small bilateral pleural effusions. No pneumothorax. This overall appearance is unchanged from the prior. IMPRESSION: Endotracheal tube terminates 2.7 cm above the carina. Additional support apparatus as above. Possible mild interstitial edema with small bilateral pleural effusions. Patchy bilateral lower lobe opacities, right greater than left. Overall appearance is unchanged from the prior. Electronically Signed   By: Julian Hy M.D.   On: 08/09/2019 08:02   DG Chest 1 View  Result Date: 08/07/2019 CLINICAL DATA:  Intubation EXAM: CHEST  1 VIEW COMPARISON:  A 03/2019 FINDINGS: The endotracheal tube terminates above the carina. The right-sided PICC line is stable in positioning. There is an LVAD in place. The heart size remains enlarged. There are moderate to large bilateral pleural effusions, right greater than left. Diffuse bilateral hazy airspace opacities with prominent interstitial lung markings are again noted. IMPRESSION: 1. Lines and tubes as above. 2. Persistent findings of congestive heart failure including bilateral pleural effusions, right greater than left. Electronically Signed   By: Constance Holster M.D.   On: 08/07/2019 16:31   DG Chest Port 1 View  Result Date:  08/08/2019 CLINICAL DATA:  Systolic  heart failure. EXAM: PORTABLE CHEST 1 VIEW COMPARISON:  08/07/2019 FINDINGS: Endotracheal tube tip is approximately 3.8 centimeters above the carina. Nasogastric tube is in place, tip beyond the gastroesophageal junction and off the image. RIGHT-sided PICC line tip overlies the superior vena cava. LEFT ventricular assist device is unchanged in position. Heart size is enlarged and stable in configuration. There are diffuse airspace filling opacities, slightly improved since prior study. Specifically there is improved aeration at the RIGHT lung base. IMPRESSION: Persistent bilateral airspace filling opacities, slightly improved since prior study. Electronically Signed   By: Nolon Nations M.D.   On: 08/08/2019 09:17   DG Abd Portable 1V  Result Date: 08/07/2019 CLINICAL DATA:  OG tube placement EXAM: PORTABLE ABDOMEN - 1 VIEW COMPARISON:  08/04/2019 FINDINGS: The OG tube projects over the patient's stomach. There is an LVAD in place. The bowel gas pattern is nonspecific. There is gaseous distention of the colon. IMPRESSION: OG tube projects over the patient's stomach. Electronically Signed   By: Constance Holster M.D.   On: 08/07/2019 18:29     Medications:     Scheduled Medications: . alteplase  2 mg Intracatheter Once  . aspirin  81 mg Per Tube Daily  . atorvastatin  80 mg Per Tube Daily  . bisacodyl  10 mg Rectal Once  . chlorhexidine gluconate (MEDLINE KIT)  15 mL Mouth Rinse BID  . Chlorhexidine Gluconate Cloth  6 each Topical Daily  . clonazePAM  1 mg Per Tube BID  . docusate  100 mg Per Tube BID  . feeding supplement (PROSource TF)  90 mL Per Tube BID  . folic acid  1 mg Per Tube Daily  . free water  20 mL Per Tube Q4H  . furosemide  40 mg Intravenous BID  . gabapentin  300 mg Per Tube Q8H  . insulin aspart  0-15 Units Subcutaneous Q4H  . insulin aspart  3 Units Subcutaneous Q4H  . mouth rinse  15 mL Mouth Rinse 10 times per day  . midodrine  15 mg Per Tube TID WC  . multivitamin   15 mL Per Tube Daily  . pantoprazole sodium  40 mg Per Tube Daily  . polyethylene glycol  17 g Oral Daily  . potassium chloride  40 mEq Per Tube BID  . quiNIDine sulfate  150 mg Per Tube Q6H  . sildenafil  20 mg Per Tube TID  . sodium chloride flush  10-40 mL Intracatheter Q12H  . thiamine  100 mg Per Tube Daily    Infusions: . sodium chloride    . sodium chloride Stopped (08/05/19 1236)  . sodium chloride 20 mL/hr at 08/01/19 1700  . amiodarone 30 mg/hr (08/09/19 0600)  . feeding supplement (VITAL 1.5 CAL) 1,000 mL (08/09/19 0826)  . fentaNYL infusion INTRAVENOUS 125 mcg/hr (08/09/19 0600)  . heparin 1,150 Units/hr (08/09/19 0600)  . lidocaine 1 mg/min (08/09/19 0600)  . phenylephrine (NEO-SYNEPHRINE) Adult infusion 10 mcg/min (08/08/19 1000)  . propofol (DIPRIVAN) infusion 25 mcg/kg/min (08/09/19 0600)    PRN Medications: Place/Maintain arterial line **AND** sodium chloride, sodium chloride, acetaminophen, fentaNYL, midazolam, sodium chloride flush   Assessment/Plan:    1. Refractory Ventricular Fibrillation  -multiple episodes ~10 of VF on 7/28 - multiple episodes 7/30 am => IABP placed as well as TTVP given relative bradycardia.  IABP and TTVP now out.  Continues to have VF 7/30 , 7/31, 8/1, 8/3, 8/4, 8/6.    - Ranexa and Mexiletine  currently on hold (unable to crush and administer through tube).  - On lidocaine gtt at 1, level ok yesterday and pending today (follow daily).   - Continue amiodarone gtt at 30 mg/hr.   - Sotalol not a good option given his history of bradycardia.  - Keep K > 4.0 and Mg > 2.0. - Unable to tolerate extubation on 8/6 due to VT => occurs when he wakes up, presumably with stress.  Stable currently as intubated and sedated.  - Discussed again with Dr. Rayann Heman this morning => Last ECG with QTc 471 msec and 1st degree AVB 280 msec.  We are going to try him on quinidine, will stop lidocaine when quinidine started. Will follow ECG closely.  If he  tolerates quinidine, will try weaning vent and extubating on quinidine.   2. Acute Systolic Heart Failure s/p VAD - EF 16%, RV systolic function mildly reduced - HMIII LVAD placed 06/25/19.  - RV looks good on echo, cannula ok  - Co-ox 68% - avoid inotropes with VF, remains on midodrine 15 mg tid and off phenylephrine.  - Off digoxin with bradycardia/AKI.  - Continue Heparin gtt, off warfarin currently. LDH stable at 230.   - Creatinine lower today 1.65 with CVP 15. Will give Lasix 40 mg IV bid.   3. Acute hypoxic resp failure - intubated in setting of VF - vent management per PCCM.  When vent is weaned and he is awake, has VT/VF.  As above, will see if we can keep arrhythmias quiet on quinidine + amiodarone and try again to extubate.   4. CAD - S/p Acute STEMI 6/21.Emergent cath showed severe multivessel disease w/ 75% mid- distal LM, 100% prox LAD, not amendable to PCI (unable to cross w/ guide wire, probable CTO), 100% prox LCx (probable CTO) and 100% ostial RCA (probable CTO). There were mildto moderate collaterals right to left to the LAD, minimal right to right collateralsan minimalleft to left collaterals.LVEF 25% -Hs trop peaked 27,000. - No targets for CABG / PCI.  - S/P HMIII VAD - Continue ASA + statin   5. Renal mass = RCC - MRI abdomen- 1.8 cm right renal lesion, most consistent with renal cell carcinoma. No evidence of renal vein involvement or nodal metastasis. - Discussed w/ Urology and R RCC is small and will just require surveillance. Ok to proceed with VAD from their standpoint.   6. Urinary retention - Foley   7. ID:  - ?HCAP => Klebsiella in trach aspirate from 7/29.  - Completed cefepime 08/02/19, switched to ancef (stopped 8/3) - Blood CX repeated 08/04/19, NGTD - Tm 100.2.  Discussed with CCM, no abx for now.   8. Anemia  - Hgb 9.1 - No obvious source of bleeding.  - Repeat CBC in am. Transfuse Hgb < 7.5   9. AKI - Creatinine  1.87>>2.13>>1.99>>1.65 - Lasix today.    CRITICAL CARE Performed by: Loralie Champagne  Total critical care time: 40 minutes  Critical care time was exclusive of separately billable procedures and treating other patients.  Critical care was necessary to treat or prevent imminent or life-threatening deterioration.  Critical care was time spent personally by me on the following activities: development of treatment plan with patient and/or surrogate as well as nursing, discussions with consultants, evaluation of patient's response to treatment, examination of patient, obtaining history from patient or surrogate, ordering and performing treatments and interventions, ordering and review of laboratory studies, ordering and review of radiographic studies, pulse oximetry and  re-evaluation of patient's condition.  Loralie Champagne 08/09/2019 9:12 AM

## 2019-08-09 NOTE — Progress Notes (Signed)
[place] for Heparin Indication: LVAD  Allergies  Allergen Reactions  . Codeine Hives    Patient Measurements: Height: 5\' 9"  (175.3 cm) Weight: 92.1 kg (203 lb 0.7 oz) IBW/kg (Calculated) : 70.7 Heparin Dosing Weight: ~ 92 kg  Vital Signs: Temp: 100.2 F (37.9 C) (08/08 1015) Temp Source: Bladder (08/08 0400) BP: 89/69 (08/08 1000) Pulse Rate: 65 (08/08 1015)  Labs: Recent Labs    08/07/19 0528 08/07/19 0808 08/07/19 1546 08/07/19 1546 08/07/19 1946 08/08/19 0310 08/09/19 0340  HGB  --    < > 9.7*   < >  --  9.0* 9.1*  HCT  --    < > 31.6*  --   --  29.9* 29.9*  PLT  --    < > 277  --   --  245 237  LABPROT 15.0  --   --   --   --  16.0*  --   INR 1.2  --   --   --   --  1.3*  --   HEPARINUNFRC 0.28*  --   --   --   --  0.39 0.30  CREATININE 2.13*   < > 2.10*   < > 2.11* 1.95* 1.61*   < > = values in this interval not displayed.    Estimated Creatinine Clearance: 58.1 mL/min (A) (by C-G formula based on SCr of 1.61 mg/dL (H)).  Assessment: 54 yo male s/p LVAD HM3 placement 6/24, followed by IABP 7/8-7/13. 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  Heparin level 0.3 at goal range this AM.   Urine with pink-tinge earlier in the week now cleared, Hgb stable   IABP removed 8/5, heparin restarted for LVAD Extubated and reintubated 8/6  Goal of Therapy:  Heparin level goal 0.2-0.5 Monitor platelets by anticoagulation protocol: Yes   Plan:  Continue IV heparin at current rate of 1150 units/hr F/u ability to tolerate po anticoagulation eventually.  Bonnita Nasuti Pharm.D. CPP, BCPS Clinical Pharmacist [phone] 08/09/2019 11:01 AM     [facility] pharmacy phone numbers are listed on [email or web address]

## 2019-08-09 NOTE — Progress Notes (Signed)
LVAD DRESSING CHANGE NOTE  Drive Line:  Existing dressing clean, dry, intact. Anchor intact. Existing VAD dressing removed and site care performed using sterile technique. Drive line exit site cleaned with sterile saline only, allowed to dry, and gauze dressing with silver strip  re-applied. Exit site partially incorporated, the velour is fully implanted at exit site. Exit site remains red, no tenderness, drainage, foul odor or rash noted. Drive line anchor re-applied.  Continue q 3 day dressing changes per bedside nurse. Next dressing change due: 08/06/2019.

## 2019-08-09 NOTE — Progress Notes (Addendum)
NAME:  Aaron [name], MRN:  [id number], DOB:  [birth year], LOS: 12 ADMISSION DATE:  07/26/2019, CONSULTATION DATE:  07/30/19 REFERRING MD:  Heart Failure, CHIEF COMPLAINT:  Respiratory failure -- VF, LVAD   Brief History   54 yo PMH acute systolic heart failure s/p LVAD (06/25/19), recurrent VT/VF/Torsades, multivessel CAD, R renal mass c/f RCC, suspected OSA, who presents back to ICU after prolonged hospital course after which he was transferred to [facility] 7/23. At CIR, pt developed dyspnea, hypoxia following PT 7/27. ECG obtained and reveals VF rate 210 bpm. Transferred to CVICU 7/27 for emergent DCCV. Intubated by anesthesia. DCCV performed numerous times 7/27, 7/28. Remains intubated requiring frequent cardioversion.  PCCM consulted 7/29 for vent management   Past Medical History  Acute systolic heart failure s/p LVAD CAD Recurrent VF, VT, torsades  Renal mass Debility Sleep apnea   Significant Hospital Events   6/16 admitted with acute anterolateral STEMI, cardiogenic shock requiring IABP 6/25 LVAD placement 7/03 VT/VT/Torsades requiring multiple emergent DCCV  7/06 recurrent VT/VT/Torsades requiring multiple emergent DCCV  7/07 recurrent VT/VT/Torsades requiring multiple emergent DCCV  7/08 PNA, respiratory failure, required intubation 7/13 IABP removed, extubated 7/23 discharred to CIR 7/27 recurrent VF, admit to CVICU for DCCV. Intubated 7/28 recurrent VF, DCCV multiple times 7/29 PCCM consult for vent  7/30 vt overnight defib x2 7/31 DCCV x2, improved on versed / no further shocks 7/31>> present, intermittent vtach/shocks correlating with agitation 8/6 failed extubation trial  Consults:  EP PCCM  Procedures:  ETT 7/27 >> Art line 7/28 >> RUE PICC 6/28 >>  R Femoral Venous Sheath 7/30 >>  IABP 7/30 >>   Significant Diagnostic Tests:    Micro Data:  See micro summary in epic  Antimicrobials:  See fever summary in epic  Interim history/subjective:  Remains  sedated on vent.  No vtach/vfib when heavily sedated.  Objective   Blood pressure 94/72, pulse 67, temperature 99.9 F (37.7 C), resp. rate (!) 22, height 5\' 9"  (1.753 m), weight 92.1 kg, SpO2 (!) 89 %. CVP:  [3 mmHg-16 mmHg] 12 mmHg  Vent Mode: PRVC FiO2 (%):  [50 %-100 %] 50 % Set Rate:  [18 bmp] 18 bmp Vt Set:  [560 mL] 560 mL PEEP:  [5 cmH20] 5 cmH20 Plateau Pressure:  [23 cmH20-25 cmH20] 25 cmH20   Intake/Output Summary (Last 24 hours) at 08/09/2019 0703 Last data filed at 08/09/2019 0600 Gross per 24 hour  Intake 2419.96 ml  Output 3720 ml  Net -1300.04 ml   Filed Weights   08/07/19 0600 08/08/19 0400 08/09/19 0500  Weight: 94.4 kg 92.8 kg 92.1 kg    Examination: GEN: no acute distress on vent HEENT: ETT in place, minimal secretions CV: LVAD hum, ext warm PULM: rhonci bilaterally, triggering vent GI: +BS, rectal tube in place EXT: muscle wasting, trace edema NEURO: too sedated to follow commands this am, opens eyes to painful stimuli and withdraws PSYCH: RASS -3 SKIN: No rashes  SvO2 67% BMP pending CBC stable anemia, plts. WBC trending down CBG okay Net -1.3L  Resolved Hospital Problem list   Shock  Sepsis due to UTI, Klebsiella Pneumoniae PNA-cefazolin, complete 7 days total (8/3 end date)  Assessment & Plan:   Acute Hypoxic Respiratory Failure in setting of Recurrent VF/VT, volume overloaded state of heart -PRVC 8cc/kg -VAP prevention measures  -Running out of options, not sure trach is going to fix his recurrent vtach, will likely require multidisciplinary discussion Monday  Sedation Needs for Mechanical Ventilation  -PAD  protocol with fentanyl + propofol gtt for RASS Goal -2 to -3  Recurrent VFib/Vtach requiring repeated DCCV -Cardiology / EP following  -continue lidocaine, amiodarone, digoxin -optimize electrolytes, goal K>4, Mg>2 - other options being explored, see EP note  Acute systolic heart failure s/p LVAD placement (06/25/19) -continue  supportive measures, diuresis as tolerated by renal function  -diuretic, inotrope management per primary  Coagulopathy, on warfarin PTA -heparin per pharmacy, goal level 0.2-0.4, back to warfarin once all invasive procedures are completed  At Risk Malnutrition in setting of Critical Illness -continue TF   Best practice:  Diet: TF  Pain/Anxiety/Delirium protocol (if indicated): Fentanyl + propofol gtt, PO klonipin VAP protocol (if indicated): yes DVT prophylaxis: heparin per pharmacy  GI prophylaxis: protonix  Glucose control: SSI Mobility: BR Code Status: Full  Family Communication: Per primary Disposition: ICU   The patient is critically ill with multiple organ systems failure and requires high complexity decision making for assessment and support, frequent evaluation and titration of therapies, application of advanced monitoring technologies and extensive interpretation of multiple databases. Critical Care Time devoted to patient care services described in this note independent of APP/resident time (if applicable)  is 33 minutes.   Erskine Emery MD [facility] 08/09/2019 7:03 AM Personal pager: [phone] If unanswered, please page CCM On-call: [phone]

## 2019-08-09 NOTE — Plan of Care (Signed)
?  Problem: Nutrition: ?Goal: Adequate nutrition will be maintained ?Outcome: Progressing ?  ?Problem: Elimination: ?Goal: Will not experience complications related to bowel motility ?Outcome: Progressing ?Goal: Will not experience complications related to urinary retention ?Outcome: Progressing ?  ?Problem: Pain Managment: ?Goal: General experience of comfort will improve ?Outcome: Progressing ?  ?  ?

## 2019-08-10 LAB — GLUCOSE, CAPILLARY
Glucose-Capillary: 119 mg/dL — ABNORMAL HIGH (ref 70–99)
Glucose-Capillary: 120 mg/dL — ABNORMAL HIGH (ref 70–99)
Glucose-Capillary: 121 mg/dL — ABNORMAL HIGH (ref 70–99)
Glucose-Capillary: 126 mg/dL — ABNORMAL HIGH (ref 70–99)
Glucose-Capillary: 128 mg/dL — ABNORMAL HIGH (ref 70–99)
Glucose-Capillary: 129 mg/dL — ABNORMAL HIGH (ref 70–99)
Glucose-Capillary: 134 mg/dL — ABNORMAL HIGH (ref 70–99)

## 2019-08-10 LAB — COOXEMETRY PANEL
Carboxyhemoglobin: 1.9 % — ABNORMAL HIGH (ref 0.5–1.5)
Methemoglobin: 1.3 % (ref 0.0–1.5)
O2 Saturation: 78.2 %
Total hemoglobin: 9.7 g/dL — ABNORMAL LOW (ref 12.0–16.0)

## 2019-08-10 LAB — CBC
HCT: 31.3 % — ABNORMAL LOW (ref 39.0–52.0)
HCT: 31.4 % — ABNORMAL LOW (ref 39.0–52.0)
Hemoglobin: 9.1 g/dL — ABNORMAL LOW (ref 13.0–17.0)
Hemoglobin: 9.2 g/dL — ABNORMAL LOW (ref 13.0–17.0)
MCH: 27.8 pg (ref 26.0–34.0)
MCH: 27.5 pg (ref 26.0–34.0)
MCHC: 29.1 g/dL — ABNORMAL LOW (ref 30.0–36.0)
MCHC: 29.3 g/dL — ABNORMAL LOW (ref 30.0–36.0)
MCV: 95.7 fL (ref 80.0–100.0)
MCV: 94 fL (ref 80.0–100.0)
Platelets: 246 10*3/uL (ref 150–400)
Platelets: 258 10*3/uL (ref 150–400)
RBC: 3.27 MIL/uL — ABNORMAL LOW (ref 4.22–5.81)
RBC: 3.34 MIL/uL — ABNORMAL LOW (ref 4.22–5.81)
RDW: 18.6 % — ABNORMAL HIGH (ref 11.5–15.5)
RDW: 18.7 % — ABNORMAL HIGH (ref 11.5–15.5)
WBC: 10.8 10*3/uL — ABNORMAL HIGH (ref 4.0–10.5)
WBC: 11.4 10*3/uL — ABNORMAL HIGH (ref 4.0–10.5)
nRBC: 0 % (ref 0.0–0.2)
nRBC: 0.2 % (ref 0.0–0.2)

## 2019-08-10 LAB — LACTATE DEHYDROGENASE: LDH: 235 U/L — ABNORMAL HIGH (ref 98–192)

## 2019-08-10 LAB — TYPE AND SCREEN
ABO/RH(D): O NEG
Antibody Screen: NEGATIVE

## 2019-08-10 LAB — COMPREHENSIVE METABOLIC PANEL
ALT: 47 U/L — ABNORMAL HIGH (ref 0–44)
AST: 87 U/L — ABNORMAL HIGH (ref 15–41)
Albumin: 2 g/dL — ABNORMAL LOW (ref 3.5–5.0)
Alkaline Phosphatase: 120 U/L (ref 38–126)
Anion gap: 9 (ref 5–15)
BUN: 76 mg/dL — ABNORMAL HIGH (ref 6–20)
CO2: 29 mmol/L (ref 22–32)
Calcium: 8.5 mg/dL — ABNORMAL LOW (ref 8.9–10.3)
Chloride: 105 mmol/L (ref 98–111)
Creatinine, Ser: 1.53 mg/dL — ABNORMAL HIGH (ref 0.61–1.24)
GFR calc Af Amer: 58 mL/min — ABNORMAL LOW (ref >60)
GFR calc non Af Amer: 50 mL/min — ABNORMAL LOW (ref >60)
Glucose, Bld: 137 mg/dL — ABNORMAL HIGH (ref 70–99)
Potassium: 4.2 mmol/L (ref 3.5–5.1)
Sodium: 143 mmol/L (ref 135–145)
Total Bilirubin: 0.7 mg/dL (ref 0.3–1.2)
Total Protein: 6 g/dL — ABNORMAL LOW (ref 6.5–8.1)

## 2019-08-10 LAB — PROCALCITONIN: Procalcitonin: 0.31 ng/mL

## 2019-08-10 LAB — HEPARIN LEVEL (UNFRACTIONATED): Heparin Unfractionated: 0.25 IU/mL — ABNORMAL LOW (ref 0.30–0.70)

## 2019-08-10 LAB — MAGNESIUM: Magnesium: 1.9 mg/dL (ref 1.7–2.4)

## 2019-08-10 MED ORDER — PANTOPRAZOLE SODIUM 40 MG IV SOLR
40.0000 mg | Freq: Two times a day (BID) | INTRAVENOUS | Status: DC
  Administered 2019-08-10 – 2019-08-19 (×20): 40 mg via INTRAVENOUS
  Filled 2019-08-10 (×20): qty 40

## 2019-08-10 MED ORDER — AMIODARONE HCL IN DEXTROSE 360-4.14 MG/200ML-% IV SOLN
30.0000 mg/h | INTRAVENOUS | Status: DC
  Administered 2019-08-10 – 2019-08-20 (×18): 30 mg/h via INTRAVENOUS
  Filled 2019-08-10 (×18): qty 200

## 2019-08-10 MED ORDER — MAGNESIUM SULFATE 2 GM/50ML IV SOLN
2.0000 g | Freq: Once | INTRAVENOUS | Status: AC
  Administered 2019-08-10: 2 g via INTRAVENOUS
  Filled 2019-08-10: qty 50

## 2019-08-10 MED ORDER — INSULIN ASPART 100 UNIT/ML ~~LOC~~ SOLN
3.0000 [IU] | SUBCUTANEOUS | Status: DC
  Administered 2019-08-10 – 2019-08-20 (×55): 3 [IU] via SUBCUTANEOUS

## 2019-08-10 NOTE — Progress Notes (Signed)
NAME:  Aaron [name], MRN:  [id number], DOB:  [birth year], LOS: 78 ADMISSION DATE:  07/14/2019, CONSULTATION DATE:  07/30/19 REFERRING MD:  Heart Failure, CHIEF COMPLAINT:  Respiratory failure -- VF, LVAD   Brief History   54 yo PMH acute systolic heart failure s/p LVAD (06/25/19), recurrent VT/VF/Torsades, multivessel CAD, R renal mass c/f RCC, suspected OSA, who presents back to ICU after prolonged hospital course after which he was transferred to [facility] 7/23. At CIR, pt developed dyspnea, hypoxia following PT 7/27. ECG obtained and reveals VF rate 210 bpm. Transferred to CVICU 7/27 for emergent DCCV. Intubated by anesthesia. DCCV performed numerous times 7/27, 7/28. Remains intubated requiring frequent cardioversion.  PCCM consulted 7/29 for vent management   Past Medical History  Acute systolic heart failure s/p LVAD CAD Recurrent VF, VT, torsades  Renal mass Debility Sleep apnea   Significant Hospital Events   6/16 admitted with acute anterolateral STEMI, cardiogenic shock requiring IABP 6/25 LVAD placement 7/03 VT/VT/Torsades requiring multiple emergent DCCV  7/06 recurrent VT/VT/Torsades requiring multiple emergent DCCV  7/07 recurrent VT/VT/Torsades requiring multiple emergent DCCV  7/08 PNA, respiratory failure, required intubation 7/13 IABP removed, extubated 7/23 discharred to CIR 7/27 recurrent VF, admit to CVICU for DCCV. Intubated 7/28 recurrent VF, DCCV multiple times 7/29 PCCM consult for vent  7/30 vt overnight defib x2 7/31 DCCV x2, improved on versed / no further shocks 7/31>> present, intermittent vtach/shocks correlating with agitation 8/6 failed extubation trial  Consults:  EP PCCM  Procedures:  ETT 7/27 >> Art line 7/28 >> RUE PICC 6/28 >>  R Femoral Venous Sheath 7/30 >> out IABP 7/30 >> out  Significant Diagnostic Tests:    Micro Data:  resp 7/29 >> klebsiella resp 8/8 >>GPC pairs >>   Antimicrobials:  Zosyn 8/8 >>  Interim  history/subjective:   Critically ill, intubated, sedated on propofol and fentanyl On low-dose Neo-Synephrine. No arrhythmias overnight    Objective   Blood pressure (!) 82/70, pulse 61, temperature 99.3 F (37.4 C), temperature source Bladder, resp. rate 19, height 5\' 9"  (1.753 m), weight 90.7 kg, SpO2 99 %. CVP:  [1 mmHg-17 mmHg] 11 mmHg  Vent Mode: PRVC FiO2 (%):  [40 %-50 %] 40 % Set Rate:  [18 bmp] 18 bmp Vt Set:  [560 mL] 560 mL PEEP:  [5 cmH20] 5 cmH20 Plateau Pressure:  [22 cmH20-23 cmH20] 22 cmH20   Intake/Output Summary (Last 24 hours) at 08/10/2019 0800 Last data filed at 08/10/2019 0700 Gross per 24 hour  Intake 3153.47 ml  Output 3440 ml  Net -286.53 ml   Filed Weights   08/08/19 0400 08/09/19 0500 08/10/19 0500  Weight: 92.8 kg 92.1 kg 90.7 kg    Examination: GEN: no acute distress, sedated on vent HEENT: ETT in place, minimal secretions CV: LVAD hum, ext warm PULM: rhonci bilaterally, triggering vent GI: +BS, rectal tube in place EXT: muscle wasting, trace edema NEURO: Sedated, does not follow commands PSYCH: RASS -3 SKIN: No rashes  SvO2 78% Labs show decrease in BUN and creatinine, stable anemia, no leukocytosis, low procalcitonin  Chest x-ray 8/8 personally reviewed shows bibasal patchy infiltrates  Resolved Hospital Problem list   Shock  Sepsis due to UTI, Klebsiella Pneumoniae PNA-cefazolin, complete 7 days total (8/3 end date)  Assessment & Plan:   Acute Hypoxic Respiratory Failure in setting of Recurrent VF/VT, volume overloaded state of heart -PRVC 8cc/kg -VAP prevention measures  -Not weaning currently since he seems to have arrhythmias when sedation is taken off,  await goals of care discussion with CHF service  HAP -started on Zosyn, await respiratory cultures, low procalcitonin reassuring  Sedation Needs for Mechanical Ventilation  -PAD protocol with fentanyl + propofol gtt for RASS Goal -2  -Would like to minimize fentanyl to ensure  that we are not over sedating -Continue oral clonazepam  Recurrent VFib/Vtach requiring repeated DCCV -Cardiology / EP following  -off lidocaine, now on quinidine and amiodarone, EP following -optimize electrolytes, goal K>4, Mg>2  Acute systolic heart failure s/p LVAD placement (06/25/19) -continue supportive measures, diuresis as tolerated by renal function , currently 40 every 12 -?  SpO2 improved, consider decreasing milrinone  Coagulopathy, on warfarin PTA -heparin per pharmacy, goal level 0.2-0.4, back to warfarin once all invasive procedures are completed  At Risk Malnutrition in setting of Critical Illness -continue TF   Best practice:  Diet: TF  Pain/Anxiety/Delirium protocol (if indicated): Fentanyl + propofol gtt, PO klonipin VAP protocol (if indicated): yes DVT prophylaxis: heparin per pharmacy  GI prophylaxis: protonix  Glucose control: SSI Mobility: BR Code Status: Full  Family Communication: Per primary Disposition: ICU     The patient is critically ill with multiple organ systems failure and requires high complexity decision making for assessment and support, frequent evaluation and titration of therapies, application of advanced monitoring technologies and extensive interpretation of multiple databases. Critical Care Time devoted to patient care services described in this note independent of APP/resident  time is 31 minutes.    Kara Mead MD. [facility]. [facility]  If no response to pager , please call [phone]   08/10/2019

## 2019-08-10 NOTE — Progress Notes (Addendum)
Patient ID: Aaron [name], male   DOB: 1965/06/13, 54 y.o.   MRN: [id number]   Advanced Heart Failure VAD Team Note  PCP-Cardiologist: No primary care provider on file.   Subjective:    - Had refractory VF throughout the night 7/28 w/ ~10 shocks. Now intubated.  - Developed bradycardia and further VF 7/30 am, TTVP + IABP placed 7/30 am.   - 8/1 completed cefepime, switched to ancef. Had VF 08/02/19 shock x1  - 8/3 Echo ramp, increased speed to 5400. VF x3. Failed IABP wean. Speed decreased to 5300.   - 08/05/19 VF shock x1 - 8/5 IABP removed.  VT shock x 3 when sedation weaned and patient agitated.  - 8/6 Extubated, VT with shock x 3, reintubated.  -8/8 Lidocaine stopped. Quinidine started   PR interval currently ~280 msec and QT currently ~460 msec this am, on quinidine.   Rhythm stable overnight. No VT/VF. NSR in the 60s. However developed hypotension w/ quinidine. Now back on NEO at 20 mcg/min.  MAPs now stable in the 70s.   K 4.2 Mg 1.9   Good UOP yesterday. -3.6L out. Wt down 4 lb. CVP 13 today. SCr improving, 1.95>>1.61>>1.53   RN reports that while pt was turned to be cleaned he was noted to have rectal bleeding w/ clot. Hgb however stable at 9.1. He is on IV heparin.    LVAD INTERROGATION:  HeartMate 3 LVAD:   Flow 4.6 liters/min, speed 5400, power 3.2, PI 3.2.  1 PI event today  Objective:    Vital Signs:   Temp:  [98.2 F (36.8 C)-100.4 F (38 C)] 99.3 F (37.4 C) (08/09 0400) Pulse Rate:  [57-72] 61 (08/09 0402) Resp:  [12-23] 19 (08/09 0400) BP: (73-95)/(54-77) 82/70 (08/09 0600) SpO2:  [95 %-99 %] 99 % (08/09 0402) Arterial Line BP: (63-97)/(55-85) 77/67 (08/09 0600) FiO2 (%):  [40 %-50 %] 40 % (08/09 0801) Weight:  [90.7 kg] 90.7 kg (08/09 0500) Last BM Date: 08/10/19 Mean arterial Pressure 70s   Intake/Output:   Intake/Output Summary (Last 24 hours) at 08/10/2019 0803 Last data filed at 08/10/2019 0800 Gross per 24 hour  Intake 3153.47 ml  Output 3615 ml    Net -461.53 ml     Physical Exam  CVP 13 General:  Intubated and sedated HEENT: + ETT otherwise Normal. Neck: Supple, JVP elevated to jawline. Carotids OK.  Cardiac:  Mechanical heart sounds with LVAD hum present.  Lungs:  Intubated and clear  Abdomen:  NT, ND, no HSM. No bruits or masses. +BS  LVAD exit site: Well-healed and incorporated. Dressing dry and intact. No erythema or drainage. Stabilization device present and accurately applied. Driveline dressing changed daily per sterile technique. Extremities:  Warm and dry. No cyanosis, clubbing, rash.  Trace bilateral LEE  Neuro:  Intubated and sedated   Telemetry   NSR 60s. No VT/VF overnight (personally reviewed)  Labs   Basic Metabolic Panel: Recent Labs  Lab 08/05/19 1644 08/05/19 1644 08/05/19 1749 08/06/19 0456 08/07/19 0528 08/07/19 0528 08/07/19 1546 08/07/19 1546 08/07/19 1946 08/07/19 1946 08/08/19 0310 08/09/19 0340 08/10/19 0336  NA 135   < > 136   < > 139   < > 135  --  138  --  136 138 143  K 5.2*   < > 4.7   < > 4.3   < > 4.3  --  4.0  --  3.9 4.1 4.2  CL 101   < > 101   < >  99   < > 99  --  99  --  100 99 105  CO2 24   < > 23   < > 26   < > 22  --  26  --  '25 26 29  ' GLUCOSE 151*   < > 133*   < > 137*   < > 169*  --  150*  --  108* 220* 137*  BUN 88*   < > 84*   < > 100*   < > 103*  --  103*  --  99* 85* 76*  CREATININE 1.91*   < > 1.94*   < > 2.13*   < > 2.10*  --  2.11*  --  1.95* 1.61* 1.53*  CALCIUM 8.1*   < > 8.2*   < > 8.7*   < > 8.7*   < > 8.4*   < > 8.7* 8.5* 8.5*  MG  --   --   --    < > 2.0  --  2.0  --   --   --  2.3 2.0 1.9  PHOS 4.2  --  4.1  --   --   --  4.7*  --   --   --   --   --   --    < > = values in this interval not displayed.    Liver Function Tests: Recent Labs  Lab 08/07/19 1546 08/10/19 0336  AST 87* 87*  ALT 25 47*  ALKPHOS 157* 120  BILITOT 0.6 0.7  PROT 6.4* 6.0*  ALBUMIN 2.2* 2.0*   No results for input(s): LIPASE, AMYLASE in the last 168 hours. No results  for input(s): AMMONIA in the last 168 hours.  CBC: Recent Labs  Lab 08/07/19 0808 08/07/19 1546 08/08/19 0310 08/09/19 0340 08/10/19 0336  WBC 12.1* 15.5* 14.0* 11.1* 10.8*  NEUTROABS  --  13.2*  --   --   --   HGB 8.9* 9.7* 9.0* 9.1* 9.1*  HCT 28.8* 31.6* 29.9* 29.9* 31.3*  MCV 92.6 90.5 92.0 93.4 95.7  PLT 229 277 245 237 246    INR: Recent Labs  Lab 08/04/19 0432 08/05/19 0441 08/06/19 0456 08/07/19 0528 08/08/19 0310  INR 1.3* 1.3* 1.2 1.2 1.3*    Other results:    Imaging   DG Chest 1 View  Result Date: 08/09/2019 CLINICAL DATA:  ARDS EXAM: CHEST  1 VIEW COMPARISON:  08/08/2019 FINDINGS: Endotracheal tube terminates 2.7 cm above the carina. Enteric tube terminates in the proximal gastric body. Right arm PICC terminates at the cavoatrial junction. Cardiomegaly.  LVAD. Defibrillator pads overlying the left hemithorax. Pulmonary vascular congestion with possible mild interstitial edema. Patchy bilateral lower lobe opacities, right greater than left. Possible small bilateral pleural effusions. No pneumothorax. This overall appearance is unchanged from the prior. IMPRESSION: Endotracheal tube terminates 2.7 cm above the carina. Additional support apparatus as above. Possible mild interstitial edema with small bilateral pleural effusions. Patchy bilateral lower lobe opacities, right greater than left. Overall appearance is unchanged from the prior. Electronically Signed   By: Julian Hy M.D.   On: 08/09/2019 08:02     Medications:     Scheduled Medications: . alteplase  2 mg Intracatheter Once  . aspirin  81 mg Per Tube Daily  . atorvastatin  80 mg Per Tube Daily  . bisacodyl  10 mg Rectal Once  . chlorhexidine gluconate (MEDLINE KIT)  15 mL Mouth Rinse BID  . Chlorhexidine Gluconate Cloth  6 each Topical Daily  . clonazePAM  1 mg Per Tube BID  . docusate  100 mg Per Tube BID  . feeding supplement (PROSource TF)  90 mL Per Tube BID  . folic acid  1 mg Per  Tube Daily  . free water  20 mL Per Tube Q4H  . furosemide  40 mg Intravenous BID  . gabapentin  300 mg Per Tube Q8H  . insulin aspart  0-15 Units Subcutaneous Q4H  . insulin aspart  3 Units Subcutaneous Q4H  . mouth rinse  15 mL Mouth Rinse 10 times per day  . midodrine  15 mg Per Tube TID WC  . multivitamin  15 mL Per Tube Daily  . pantoprazole sodium  40 mg Per Tube Daily  . polyethylene glycol  17 g Oral Daily  . potassium chloride  40 mEq Per Tube BID  . quiNIDine sulfate  150 mg Per Tube Q6H  . sildenafil  20 mg Per Tube TID  . sodium chloride flush  10-40 mL Intracatheter Q12H  . thiamine  100 mg Per Tube Daily    Infusions: . sodium chloride    . sodium chloride Stopped (08/05/19 1236)  . sodium chloride 20 mL/hr at 08/01/19 1700  . amiodarone 30 mg/hr (08/10/19 0700)  . feeding supplement (VITAL 1.5 CAL) 1,000 mL (08/10/19 0230)  . fentaNYL infusion INTRAVENOUS 100 mcg/hr (08/10/19 0700)  . heparin 1,150 Units/hr (08/10/19 0700)  . phenylephrine (NEO-SYNEPHRINE) Adult infusion 30 mcg/min (08/10/19 0700)  . piperacillin-tazobactam (ZOSYN)  IV 12.5 mL/hr at 08/10/19 0700  . propofol (DIPRIVAN) infusion 25 mcg/kg/min (08/10/19 0700)    PRN Medications: Place/Maintain arterial line **AND** sodium chloride, sodium chloride, acetaminophen, fentaNYL, midazolam, sodium chloride flush   Assessment/Plan:    1. Refractory Ventricular Fibrillation  -multiple episodes ~10 of VF on 7/28 - multiple episodes 7/30 am => IABP placed as well as TTVP given relative bradycardia.  IABP and TTVP now out.  Continued to have VF 7/30 , 7/31, 8/1, 8/3, 8/4, 8/6.    - Ranexa and Mexiletine currently on hold (unable to crush and administer through tube).  - Sotalol not a good option given his history of bradycardia.  - Lidocaine discontinued 8/8 - now on quinidine 150 mg q6h. No VTVF overnight.  - Continue amiodarone gtt at 30 mg/hr.   - Keep K > 4.0 and Mg > 2.0. - Unable to tolerate  extubation on 8/6 due to VT => occurs when he wakes up, presumably with stress.  Stable currently as intubated and sedated.  - Will follow ECG closely w/ quinidine.  If he continues to tolerate, will try weaning vent and extubating on quinidine.   - Appreciate EP's assistance   2. Acute Systolic Heart Failure s/p VAD - EF 09%, RV systolic function mildly reduced - HMIII LVAD placed 06/25/19.  - RV looks good on echo, cannula ok  - Co-ox 78% - avoid inotropes with VF, remains on midodrine 15 mg tid + phenylephrine 20 mcg/min.  - Off digoxin with bradycardia/AKI.  - Continue Heparin gtt, off warfarin currently. LDH stable at 235.   - Creatinine lower today 1.53 with CVP 13. Continue Lasix 40 mg IV bid.   3. Acute hypoxic resp failure - intubated in setting of VF - vent management per PCCM.  When vent is weaned and he is awake, has VT/VF.  As above, will see if we can keep arrhythmias quiet on quinidine + amiodarone and try again to extubate.  4. CAD - S/p Acute STEMI 6/21.Emergent cath showed severe multivessel disease w/ 75% mid- distal LM, 100% prox LAD, not amendable to PCI (unable to cross w/ guide wire, probable CTO), 100% prox LCx (probable CTO) and 100% ostial RCA (probable CTO). There were mildto moderate collaterals right to left to the LAD, minimal right to right collateralsan minimalleft to left collaterals.LVEF 25% -Hs trop peaked 27,000. - No targets for CABG / PCI.  - S/P HMIII VAD - Continue ASA + statin   5. Renal mass = RCC - MRI abdomen- 1.8 cm right renal lesion, most consistent with renal cell carcinoma. No evidence of renal vein involvement or nodal metastasis. - Discussed w/ Urology and R RCC is small and will just require surveillance. Ok to proceed with VAD from their standpoint.   6. Urinary retention - Foley   7. ID:  - ?HCAP => Klebsiella in trach aspirate from 7/29.  - Completed cefepime 08/02/19, switched to ancef (stopped 8/3) - Blood CX  repeated 08/04/19, NGTD - Tm 100.4. WBC trending down. Discussed with CCM, no abx for now.   8. Anemia  - Hgb 9.1, stable  - RN noted mild rectal bleeding 8/8. Monitor closely - Repeat CBC in am. Transfuse Hgb < 7.5   9. AKI - Creatinine 1.87>>2.13>>1.99>>1.65>>1.53 - follow BMP    Lyda Jester, PA-C 08/10/2019  8:03 AM  Agree with above.  Remains intubated and sedated. Switch to quinidine yesterday to try and suppress VT/vF. Rhythm stable overnight. QT acceptable. According to nurse had rectal bleeding overnight. No other evidence to suggest ischemic colitis. Volume status improving with IV lasix. VAD parameters stable Co-ox 78%  General:  Intubated/sedated HEENT: normal +ETT Neck: supple. JVP not elevated.  Carotids 2+ bilat; no bruits. No lymphadenopathy or thryomegaly appreciated. Cor: LVAD hum.  Lungs: Clear. Abdomen: obese soft, nontender, non-distended. No hepatosplenomegaly. No bruits or masses. Good bowel sounds. Driveline site clean. Anchor in place.  Extremities: no cyanosis, clubbing, rash. Warm no edema  Neuro: intubated/sedated  Will continue quinidine. Watch electrolytes. If VT/VF recurs on quinidine with vent wean will need to see if there are any last ditch ablation options. Continue heparin. Watch H/H.   CRITICAL CARE Performed by: Glori Bickers  Total critical care time: 35 minutes  Critical care time was exclusive of separately billable procedures and treating other patients.  Critical care was necessary to treat or prevent imminent or life-threatening deterioration.  Critical care was time spent personally by me (independent of midlevel providers or residents) on the following activities: development of treatment plan with patient and/or surrogate as well as nursing, discussions with consultants, evaluation of patient's response to treatment, examination of patient, obtaining history from patient or surrogate, ordering and performing treatments and  interventions, ordering and review of laboratory studies, ordering and review of radiographic studies, pulse oximetry and re-evaluation of patient's condition.   Glori Bickers, MD  8:49 AM

## 2019-08-10 NOTE — Progress Notes (Signed)
[place] for Heparin Indication: LVAD  Allergies  Allergen Reactions  . Codeine Hives    Patient Measurements: Height: 5\' 9"  (175.3 cm) Weight: 90.7 kg (199 lb 15.3 oz) IBW/kg (Calculated) : 70.7 Heparin Dosing Weight: ~ 92 kg  Vital Signs: Temp: 99.3 F (37.4 C) (08/09 0400) Temp Source: Bladder (08/09 0400) BP: 82/70 (08/09 0600) Pulse Rate: 61 (08/09 0402)  Labs: Recent Labs    08/08/19 0310 08/08/19 0310 08/09/19 0340 08/10/19 0336  HGB 9.0*   < > 9.1* 9.1*  HCT 29.9*  --  29.9* 31.3*  PLT 245  --  237 246  LABPROT 16.0*  --   --   --   INR 1.3*  --   --   --   HEPARINUNFRC 0.39  --  0.30 0.25*  CREATININE 1.95*  --  1.61* 1.53*   < > = values in this interval not displayed.    Estimated Creatinine Clearance: 60.7 mL/min (A) (by C-G formula based on SCr of 1.53 mg/dL (H)).  Assessment: 54 yo male s/p LVAD HM3 placement 6/24, followed by IABP 7/8-7/13. 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 IABP removed 8/5, heparin restarted for LVAD. Extubated and reintubated 8/6.   Heparin level is therapeutic at 0.25, on 1150 units/hr. Hgb 9.1, plt 246. LDH 235. No infusion issues - did have some rectal bleeding with clot when cleaned this AM; nothing else noted since.   Goal of Therapy:  Heparin level goal 0.2-0.5 w/ LVAD Monitor platelets by anticoagulation protocol: Yes   Plan:  Continue IV heparin at current rate of 1150 units/hr F/u ability to tolerate po anticoagulation eventually.  Antonietta Jewel, PharmD, [place] Clinical Pharmacist  Phone: [phone] 08/10/2019 8:47 AM  Please check AMION for all [facility] phone numbers After 10:00 PM, call [place] [phone]

## 2019-08-10 NOTE — Progress Notes (Signed)
LVAD Coordinator Rounding Note:  HM III LVAD implanted on 06/25/19 by Dr. Orvan Seen under Destination Therapy criteria due to recent smoking history.  Pt's transferred to CIR on 07/24/19. Had refractory VF 07/03/2019 and transferred back to [place].   Pt remains intubated, sedated. Has not had VT/VF since Friday. Started on Quinidine over the weekend. Intermittently febrile. WBC 14.8 today  Updated patient's sister on patient's status this morning.   Vital signs: Temp: 100.4 HR: 66 Art. Line: 80/62 (73) Cuff BP: 98/81 (88) Doppler: 68   O2 Sat: 97% on 40% FiO2 5 peep rate 18 TV 560 Wt: 189.6>196.4>200.8>209>209.6>210.7>214.1>214.2>211.8>208>199.9  lbs  LVAD parameters: Speed:  5300 Flow: 4.6 Power:  3.8 w PI: 3.1 Hct: 29  Alarms: none Events: none  Fixed speed: 5300 Low speed limit: 5000    Drive Line:  Existing dressing clean, dry, intact. Anchor intact and accurately applied. Continue q 3 day dressing changes per bedside nurse. Next dressing change due: 08/27/2019.   Labs:  LDH trend: 326>250>256>358>297>309>266>243>235>234>235  INR trend: 2.1>3.5>2.2>1.5>1.5>1.3>1.3>1.3>1.2>1.2>  WBC trend: 14.0>11.1>11.4  Anticoagulation Plan: -INR Goal:  2.0 - 2.5 -ASA Dose: 81 mg - will keep going for CAD for 3 mths stop date 09/25/19  Device: N/A  IABP: - placed 07/04/19 - dc'd 08/06/19  Drips: - Amiodarone 30 mg/hr - Fentanyl 75 mcg/hr - Lidocaine 1 mg/min>> off - Lasix 12 mg/hr>> off - Heparin 1150 mg/hr - Propofol 25 mcg/kg/min - Neo 20 mcg/kg/min  - Tube feed 65 ml/hr   Infection: - 07/19/2019 - BCs>>negative - 07/26/2019 - urine culture>>negative - 07/30/19 - respiratory culture>>Klebsiella pneumoniae - 08/04/19 - BCs>> NGTD final -08/09/19- respiratory culture>> Klebsiella pneumoniae  Arrythmias:  - 07/28/2019 Vfib while in CIR; emergently transferred back to 2H>> DCCV back to NSR; Amio gtt - 07/11/2019 recurrent VF>>increased amio gtt; [place] bolus/gtt; DC-CV 200J to NSR - 07/21/2019  recurrent VF>>8 separate DC-CV throughout night; required pressors for BP support and re-intubation - 07/26/2019 recurrent VT/VF>>7 separate DC-CV throughout am; IABP and pacer urgently placed in Cath Lab - 07/31/19 recurrent VT/VF>>2 additional DC-CV after IABP placment - 08/01/19 recurrent VT/VF>> pt had 3 episodes of VT/VR with 4 DC-CV - 08/02/19 recurrent VT/VF>>one DC-CV to SR - 08/04/19 recurrent VT/VF>>5 DC-CV to SR - 08/05/19 recurrent VT/VF>> one DC-CV to SR - 08/06/19 recurrent VT/VF>> episode required 3 DC-CV to SR  Respiratory:  - intubated 07/17/2019   Plan/Recommendations:  1. Call VAD pager if any VAD equipment or drive line issues. 2. Every three day dressing changes per bedside nurse.  Emerson Monte RN [place] Coordinator  Office: [phone]  24/7 Pager: [phone]

## 2019-08-10 NOTE — Progress Notes (Addendum)
Electrophysiology Rounding Note  Patient Name: Aaron [name] Date of Encounter: 08/10/2019  Primary Cardiologist: Dr. Haroldine Laws  Electrophysiologist: Dr. Rayann Heman   Subjective   Intubated/Sedated.  VT/VF quiescent.  Inpatient Medications    Scheduled Meds: . alteplase  2 mg Intracatheter Once  . aspirin  81 mg Per Tube Daily  . atorvastatin  80 mg Per Tube Daily  . bisacodyl  10 mg Rectal Once  . chlorhexidine gluconate (MEDLINE KIT)  15 mL Mouth Rinse BID  . Chlorhexidine Gluconate Cloth  6 each Topical Daily  . clonazePAM  1 mg Per Tube BID  . docusate  100 mg Per Tube BID  . feeding supplement (PROSource TF)  90 mL Per Tube BID  . folic acid  1 mg Per Tube Daily  . free water  20 mL Per Tube Q4H  . furosemide  40 mg Intravenous BID  . gabapentin  300 mg Per Tube Q8H  . insulin aspart  0-15 Units Subcutaneous Q4H  . insulin aspart  3 Units Subcutaneous Q4H  . mouth rinse  15 mL Mouth Rinse 10 times per day  . midodrine  15 mg Per Tube TID WC  . multivitamin  15 mL Per Tube Daily  . pantoprazole sodium  40 mg Per Tube Daily  . polyethylene glycol  17 g Oral Daily  . potassium chloride  40 mEq Per Tube BID  . quiNIDine sulfate  150 mg Per Tube Q6H  . sildenafil  20 mg Per Tube TID  . sodium chloride flush  10-40 mL Intracatheter Q12H  . thiamine  100 mg Per Tube Daily   Continuous Infusions: . sodium chloride    . sodium chloride Stopped (08/05/19 1236)  . sodium chloride 20 mL/hr at 08/01/19 1700  . amiodarone 30 mg/hr (08/10/19 0600)  . feeding supplement (VITAL 1.5 CAL) 1,000 mL (08/10/19 0230)  . fentaNYL infusion INTRAVENOUS 100 mcg/hr (08/10/19 0600)  . heparin 1,150 Units/hr (08/10/19 0600)  . phenylephrine (NEO-SYNEPHRINE) Adult infusion 30 mcg/min (08/10/19 0600)  . piperacillin-tazobactam (ZOSYN)  IV 12.5 mL/hr at 08/10/19 0600  . propofol (DIPRIVAN) infusion 25 mcg/kg/min (08/10/19 0600)   PRN Meds: Place/Maintain arterial line **AND** sodium  chloride, sodium chloride, acetaminophen, fentaNYL, midazolam, sodium chloride flush   Vital Signs    Vitals:   08/10/19 0400 08/10/19 0402 08/10/19 0500 08/10/19 0600  BP: (!) 79/65 (!) 79/65 (!) 89/73 (!) 82/70  Pulse: 61 61    Resp: 19     Temp: 99.3 F (37.4 C)     TempSrc: Bladder     SpO2: 97% 99%    Weight:   90.7 kg   Height:        Intake/Output Summary (Last 24 hours) at 08/10/2019 0658 Last data filed at 08/10/2019 0600 Gross per 24 hour  Intake 3166.58 ml  Output 3650 ml  Net -483.42 ml   Filed Weights   08/08/19 0400 08/09/19 0500 08/10/19 0500  Weight: 92.8 kg 92.1 kg 90.7 kg    Physical Exam    GEN- The patient is chronically ill appearing, intubated/sedated.  Head- normocephalic, atraumatic. ETT in place.  Neck- supple Lungs- + Mechanical breathing sounds on vent.  Heart- LVAD hum GI- soft Extremities- + dependent edema into thighs.  Skin- no rash or lesion Psych- sedated Neuro- sedated  Labs    CBC Recent Labs    08/07/19 1546 08/08/19 0310 08/09/19 0340 08/10/19 0336  WBC 15.5*   < > 11.1* 10.8*  NEUTROABS 13.2*  --   --   --  HGB 9.7*   < > 9.1* 9.1*  HCT 31.6*   < > 29.9* 31.3*  MCV 90.5   < > 93.4 95.7  PLT 277   < > 237 246   < > = values in this interval not displayed.   Basic Metabolic Panel Recent Labs    08/07/19 1546 08/07/19 1946 08/09/19 0340 08/10/19 0336  NA 135   < > 138 143  K 4.3   < > 4.1 4.2  CL 99   < > 99 105  CO2 22   < > 26 29  GLUCOSE 169*   < > 220* 137*  BUN 103*   < > 85* 76*  CREATININE 2.10*   < > 1.61* 1.53*  CALCIUM 8.7*   < > 8.5* 8.5*  MG 2.0   < > 2.0 1.9  PHOS 4.7*  --   --   --    < > = values in this interval not displayed.   Liver Function Tests Recent Labs    08/07/19 1546 08/10/19 0336  AST 87* 87*  ALT 25 47*  ALKPHOS 157* 120  BILITOT 0.6 0.7  PROT 6.4* 6.0*  ALBUMIN 2.2* 2.0*   No results for input(s): LIPASE, AMYLASE in the last 72 hours. Cardiac Enzymes No results for  input(s): CKTOTAL, CKMB, CKMBINDEX, TROPONINI in the last 72 hours. BNP Invalid input(s): POCBNP D-Dimer No results for input(s): DDIMER in the last 72 hours. Hemoglobin A1C No results for input(s): HGBA1C in the last 72 hours. Fasting Lipid Panel Recent Labs    08/08/19 1822  TRIG 87   Thyroid Function Tests No results for input(s): TSH, T4TOTAL, T3FREE, THYROIDAB in the last 72 hours.  Invalid input(s): FREET3  Telemetry    SB/NSR 50-60s (personally reviewed)  Radiology    DG Chest 1 View  Result Date: 08/09/2019 CLINICAL DATA:  ARDS EXAM: CHEST  1 VIEW COMPARISON:  08/08/2019 FINDINGS: Endotracheal tube terminates 2.7 cm above the carina. Enteric tube terminates in the proximal gastric body. Right arm PICC terminates at the cavoatrial junction. Cardiomegaly.  LVAD. Defibrillator pads overlying the left hemithorax. Pulmonary vascular congestion with possible mild interstitial edema. Patchy bilateral lower lobe opacities, right greater than left. Possible small bilateral pleural effusions. No pneumothorax. This overall appearance is unchanged from the prior. IMPRESSION: Endotracheal tube terminates 2.7 cm above the carina. Additional support apparatus as above. Possible mild interstitial edema with small bilateral pleural effusions. Patchy bilateral lower lobe opacities, right greater than left. Overall appearance is unchanged from the prior. Electronically Signed   By: Julian Hy M.D.   On: 08/09/2019 08:02     Patient Profile     Aaron [name] is a 54 y.o. male with a past medical history significant for CAD and recent LVAD placement. Post op course complicated by incessant VT. Quieted and transferred to CIR where he had recurrent VF. Re-admitted to ICU.   - Had refractory VF throughout the night 7/28 w/ ~10 shocks. Now intubated.  - Developed bradycardia and further VF 7/30 am, TTVP + IABP placed 7/30 am.   - 8/1 completed cefepime, switched to ancef. Had VF 08/02/19  shock x1  - 8/3 Echo ramp, increased speed to 5400. VF x3. Failed IABP wean. Speed decreased to 5300.   - 08/05/19 VF shock x1 - 8/5 IABP removed.  VT shock x 3 when sedation weaned and patient agitated.  - 8/6 Extubated, VT with shock x 3, reintubated.   Assessment & Plan  1.  Refractory VF Pt had refractory VF on amiodarone and lidocaine.  Lidocaine stopped.  Now trying quinidine 155m q6h to start and follow PR (currently ~280 msec) and QT (currently ~460 msec this am) Per Dr. ARayann Heman we can titrate medicine as ekg parameters allow Continue IV amiodarone BP does not allow beta blocker titration at this time  EP will continue to follow along.   For questions or updates, please contact [place] consult [email or web address] for contact info under Cardiology/STEMI.  Signed, MShirley Friar PA-C  08/10/2019, 6:58 AM   I have seen, examined the patient, and reviewed the above assessment and plan.  Changes to above are made where necessary.  On exam,  Inbutated, ill appearing  Arrhythmia is stable with addition of quinidine.  Lidocaine is now off.  No changes today.  Co Sign: JThompson Grayer MD

## 2019-08-11 ENCOUNTER — Inpatient Hospital Stay (HOSPITAL_COMMUNITY): Payer: BC Managed Care – PPO

## 2019-08-11 LAB — CULTURE, RESPIRATORY W GRAM STAIN: Gram Stain: NONE SEEN

## 2019-08-11 LAB — GLUCOSE, CAPILLARY
Glucose-Capillary: 120 mg/dL — ABNORMAL HIGH (ref 70–99)
Glucose-Capillary: 124 mg/dL — ABNORMAL HIGH (ref 70–99)
Glucose-Capillary: 137 mg/dL — ABNORMAL HIGH (ref 70–99)
Glucose-Capillary: 138 mg/dL — ABNORMAL HIGH (ref 70–99)
Glucose-Capillary: 139 mg/dL — ABNORMAL HIGH (ref 70–99)
Glucose-Capillary: 163 mg/dL — ABNORMAL HIGH (ref 70–99)

## 2019-08-11 LAB — CBC
HCT: 30.2 % — ABNORMAL LOW (ref 39.0–52.0)
Hemoglobin: 8.7 g/dL — ABNORMAL LOW (ref 13.0–17.0)
MCH: 27.7 pg (ref 26.0–34.0)
MCHC: 28.8 g/dL — ABNORMAL LOW (ref 30.0–36.0)
MCV: 96.2 fL (ref 80.0–100.0)
Platelets: 256 K/uL (ref 150–400)
RBC: 3.14 MIL/uL — ABNORMAL LOW (ref 4.22–5.81)
RDW: 18.8 % — ABNORMAL HIGH (ref 11.5–15.5)
WBC: 12 K/uL — ABNORMAL HIGH (ref 4.0–10.5)
nRBC: 0 % (ref 0.0–0.2)

## 2019-08-11 LAB — COMPREHENSIVE METABOLIC PANEL WITH GFR
ALT: 46 U/L — ABNORMAL HIGH (ref 0–44)
AST: 78 U/L — ABNORMAL HIGH (ref 15–41)
Albumin: 1.9 g/dL — ABNORMAL LOW (ref 3.5–5.0)
Alkaline Phosphatase: 105 U/L (ref 38–126)
Anion gap: 12 (ref 5–15)
BUN: 64 mg/dL — ABNORMAL HIGH (ref 6–20)
CO2: 28 mmol/L (ref 22–32)
Calcium: 8.2 mg/dL — ABNORMAL LOW (ref 8.9–10.3)
Chloride: 106 mmol/L (ref 98–111)
Creatinine, Ser: 1.48 mg/dL — ABNORMAL HIGH (ref 0.61–1.24)
GFR calc Af Amer: >60 mL/min
GFR calc non Af Amer: 53 mL/min — ABNORMAL LOW
Glucose, Bld: 178 mg/dL — ABNORMAL HIGH (ref 70–99)
Potassium: 3.9 mmol/L (ref 3.5–5.1)
Sodium: 146 mmol/L — ABNORMAL HIGH (ref 135–145)
Total Bilirubin: 0.8 mg/dL (ref 0.3–1.2)
Total Protein: 5.8 g/dL — ABNORMAL LOW (ref 6.5–8.1)

## 2019-08-11 LAB — HEPARIN LEVEL (UNFRACTIONATED): Heparin Unfractionated: 0.29 [IU]/mL — ABNORMAL LOW (ref 0.30–0.70)

## 2019-08-11 LAB — TRIGLYCERIDES: Triglycerides: 74 mg/dL (ref <150)

## 2019-08-11 LAB — PROCALCITONIN: Procalcitonin: 0.21 ng/mL

## 2019-08-11 LAB — COOXEMETRY PANEL
Carboxyhemoglobin: 1.5 % (ref 0.5–1.5)
Methemoglobin: 0.8 % (ref 0.0–1.5)
O2 Saturation: 70.7 %
Total hemoglobin: 11.1 g/dL — ABNORMAL LOW (ref 12.0–16.0)

## 2019-08-11 LAB — LACTATE DEHYDROGENASE: LDH: 238 U/L — ABNORMAL HIGH (ref 98–192)

## 2019-08-11 LAB — MAGNESIUM: Magnesium: 1.9 mg/dL (ref 1.7–2.4)

## 2019-08-11 MED ORDER — MAGNESIUM SULFATE 2 GM/50ML IV SOLN
2.0000 g | Freq: Once | INTRAVENOUS | Status: AC
  Administered 2019-08-11: 2 g via INTRAVENOUS
  Filled 2019-08-11: qty 50

## 2019-08-11 MED ORDER — SODIUM CHLORIDE 0.9 % IV SOLN: INTRAVENOUS | Status: DC

## 2019-08-11 MED ORDER — POTASSIUM CHLORIDE 20 MEQ/15ML (10%) PO SOLN
40.0000 meq | Freq: Three times a day (TID) | ORAL | Status: DC
  Administered 2019-08-11 – 2019-08-12 (×5): 40 meq
  Filled 2019-08-11 (×5): qty 30

## 2019-08-11 NOTE — Progress Notes (Signed)
[place] for Heparin Indication: LVAD  Allergies  Allergen Reactions  . Codeine Hives    Patient Measurements: Height: 5\' 9"  (175.3 cm) Weight: 88.2 kg (194 lb 7.1 oz) IBW/kg (Calculated) : 70.7 Heparin Dosing Weight: ~ 92 kg  Vital Signs: Temp: 97.9 F (36.6 C) (08/10 0200) Temp Source: Bladder (08/10 0000) BP: 85/74 (08/10 0342) Pulse Rate: 55 (08/10 0342)  Labs: Recent Labs    08/09/19 0340 08/09/19 0340 08/10/19 0336 08/10/19 0336 08/10/19 0910 08/11/19 0359  HGB 9.1*   < > 9.1*   < > 9.2* 8.7*  HCT 29.9*   < > 31.3*  --  31.4* 30.2*  PLT 237   < > 246  --  258 256  HEPARINUNFRC 0.30  --  0.25*  --   --  0.29*  CREATININE 1.61*  --  1.53*  --   --  1.48*   < > = values in this interval not displayed.    Estimated Creatinine Clearance: 62 mL/min (A) (by C-G formula based on SCr of 1.48 mg/dL (H)).  Assessment: 54 yo male s/p LVAD HM3 placement 6/24, followed by IABP 7/8-7/13. 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 IABP removed 8/5, heparin restarted for LVAD. Extubated and reintubated 8/6.   Heparin level is therapeutic at 0.29, on 1150 units/hr. Hgb 8.7, plt 256. LDH 238. No infusion issues - blood clot noted by nursing in nose, not further melena.   Goal of Therapy:  Heparin level goal 0.2-0.5 w/ LVAD Monitor platelets by anticoagulation protocol: Yes   Plan:  Continue IV heparin at current rate of 1150 units/hr F/u ability to tolerate po anticoagulation eventually.  Antonietta Jewel, PharmD, [facility] Clinical Pharmacist  Phone: [phone] 08/11/2019 7:15 AM  Please check AMION for all [place] phone numbers After 10:00 PM, call [place] [phone]

## 2019-08-11 NOTE — Progress Notes (Signed)
NAME:  Aaron [name], MRN:  [id number], DOB:  03/09/1965, LOS: 38 ADMISSION DATE:  07/24/2019, CONSULTATION DATE:  07/30/19 REFERRING MD:  Heart Failure, CHIEF COMPLAINT:  Respiratory failure -- VF, LVAD   Brief History   54 yo PMH acute systolic heart failure s/p LVAD (06/25/19), recurrent VT/VF/Torsades, multivessel CAD, R renal mass c/f RCC, suspected OSA, who presents back to ICU after prolonged hospital course after which he was transferred to [facility] 7/23. At CIR, pt developed dyspnea, hypoxia following PT 7/27. ECG obtained and reveals VF rate 210 bpm. Transferred to CVICU 7/27 for emergent DCCV. Intubated by anesthesia. DCCV performed numerous times 7/27, 7/28. Remains intubated requiring frequent cardioversion.  PCCM consulted 7/29 for vent management   Past Medical History  Acute systolic heart failure s/p LVAD CAD Recurrent VF, VT, torsades  Renal mass Debility Sleep apnea   Significant Hospital Events   6/16 admitted with acute anterolateral STEMI, cardiogenic shock requiring IABP 6/25 LVAD placement 7/03 VT/VT/Torsades requiring multiple emergent DCCV  7/06 recurrent VT/VT/Torsades requiring multiple emergent DCCV  7/07 recurrent VT/VT/Torsades requiring multiple emergent DCCV  7/08 PNA, respiratory failure, required intubation 7/13 IABP removed, extubated 7/23 discharred to CIR 7/27 recurrent VF, admit to CVICU for DCCV. Intubated 7/28 recurrent VF, DCCV multiple times 7/29 PCCM consult for vent  7/30 vt overnight defib x2 7/31 DCCV x2, improved on versed / no further shocks 7/31>> present, intermittent vtach/shocks correlating with agitation 8/6 failed extubation trial  Consults:  EP PCCM  Procedures:  ETT 7/27 >> Art line 7/28 >> RUE PICC 6/28 >>  R Femoral Venous Sheath 7/30 >> out IABP 7/30 >> out  Significant Diagnostic Tests:    Micro Data:  resp 7/29 >> klebsiella resp 8/8 >>>klebs >>   Antimicrobials:  Zosyn 8/8 >>  Interim  history/subjective:   Critically ill, intubated, sedated on propofol  Remains on low-dose Neo-Synephrine On amiodarone and quinidine for ventricular arrhythmias. Low-grade febrile Good urine output   Objective   Blood pressure 98/76, pulse (!) 56, temperature 98.1 F (36.7 C), resp. rate (!) 21, height 5\' 9"  (1.753 m), weight 88.2 kg, SpO2 100 %. CVP:  [8 mmHg-11 mmHg] 9 mmHg  Vent Mode: PRVC FiO2 (%):  [40 %] 40 % Set Rate:  [18 bmp] 18 bmp Vt Set:  [560 mL] 560 mL PEEP:  [5 cmH20] 5 cmH20 Plateau Pressure:  [18 cmH20-24 cmH20] 22 cmH20   Intake/Output Summary (Last 24 hours) at 08/11/2019 0947 Last data filed at 08/11/2019 0700 Gross per 24 hour  Intake 3052.34 ml  Output 4130 ml  Net -1077.66 ml   Filed Weights   08/10/19 0500 08/11/19 0000 08/11/19 0500  Weight: 90.7 kg 88.2 kg 88.2 kg    Examination: GEN: no acute distress, sedated on vent HEENT: ETT in place, minimal secretions CV: LVAD hum, ext warm PULM: rhonci bilaterally, triggering vent GI: +BS, rectal tube in place EXT: muscle wasting, trace edema NEURO: Sedated, does not follow commands PSYCH: RASS -3 SKIN: No rashes  SvO2 71% Labs show decrease in BUN and creatinine, stable anemia, mild leukocytosis, mild hyponatremia  Chest x-ray 8/8 personally reviewed shows bibasal patchy infiltrates  Resolved Hospital Problem list   Shock  Sepsis due to UTI, Klebsiella Pneumoniae PNA-cefazolin, complete 7 days total (8/3 end date)  Assessment & Plan:   Acute Hypoxic Respiratory Failure in setting of Recurrent VF/VT, volume overloaded state of heart -PRVC 8cc/kg -VAP prevention measures  -Drawing W UA today, if tolerates without arrhythmias proceed with  SBT's  HAP -started on Zosyn, await sensitivities for Klebsiella, was treated with cefazolin earlier in this course  Sedation Needs for Mechanical Ventilation  -PAD protocol with propofol gtt for RASS Goal -1 , change fentanyl to as needed -Continue oral  clonazepam  Recurrent VFib/Vtach requiring repeated DCCV -Cardiology / EP following  -off lidocaine, now on quinidine and amiodarone, EP following -Add beta-blocker once off neo- -optimize electrolytes, goal K>4, Mg>2  Acute systolic heart failure s/p LVAD placement (06/25/19) - diuresis as tolerated by renal function , currently 40 every 12 -off milrinone  Coagulopathy, on warfarin PTA -heparin per pharmacy, goal level 0.2-0.4, back to warfarin once all invasive procedures are completed  At Risk Malnutrition in setting of Critical Illness -continue TF   Best practice:  Diet: TF  Pain/Anxiety/Delirium protocol (if indicated): Fentanyl + propofol gtt, PO klonipin VAP protocol (if indicated): yes DVT prophylaxis: heparin per pharmacy  GI prophylaxis: protonix  Glucose control: SSI Mobility: BR Code Status: Full  Family Communication: Per primary Disposition: ICU     The patient is critically ill with multiple organ systems failure and requires high complexity decision making for assessment and support, frequent evaluation and titration of therapies, application of advanced monitoring technologies and extensive interpretation of multiple databases. Critical Care Time devoted to patient care services described in this note independent of APP/resident  time is 32 minutes.    Kara Mead MD. [place]. [facility]  If no response to pager , please call [phone]   08/11/2019

## 2019-08-11 NOTE — Progress Notes (Addendum)
Electrophysiology Rounding Note  Patient Name: Aaron [name] Date of Encounter: 08/11/2019  Primary Cardiologist: Dr. Haroldine Laws  Electrophysiologist: Dr. Rayann Heman   Subjective    Remains intubated, sedated.   VT/VF remains quiescent on quinidine.   Inpatient Medications    Scheduled Meds: . alteplase  2 mg Intracatheter Once  . aspirin  81 mg Per Tube Daily  . atorvastatin  80 mg Per Tube Daily  . bisacodyl  10 mg Rectal Once  . chlorhexidine gluconate (MEDLINE KIT)  15 mL Mouth Rinse BID  . Chlorhexidine Gluconate Cloth  6 each Topical Daily  . clonazePAM  1 mg Per Tube BID  . docusate  100 mg Per Tube BID  . feeding supplement (PROSource TF)  90 mL Per Tube BID  . folic acid  1 mg Per Tube Daily  . free water  20 mL Per Tube Q4H  . furosemide  40 mg Intravenous BID  . gabapentin  300 mg Per Tube Q8H  . insulin aspart  0-15 Units Subcutaneous Q4H  . insulin aspart  3 Units Subcutaneous Q4H  . mouth rinse  15 mL Mouth Rinse 10 times per day  . midodrine  15 mg Per Tube TID WC  . multivitamin  15 mL Per Tube Daily  . pantoprazole (PROTONIX) IV  40 mg Intravenous Q12H  . polyethylene glycol  17 g Oral Daily  . potassium chloride  40 mEq Per Tube BID  . quiNIDine sulfate  150 mg Per Tube Q6H  . sildenafil  20 mg Per Tube TID  . sodium chloride flush  10-40 mL Intracatheter Q12H  . thiamine  100 mg Per Tube Daily   Continuous Infusions: . sodium chloride    . sodium chloride Stopped (08/05/19 1236)  . sodium chloride 20 mL/hr at 08/01/19 1700  . amiodarone 30 mg/hr (08/11/19 0700)  . feeding supplement (VITAL 1.5 CAL) 1,000 mL (08/10/19 2000)  . fentaNYL infusion INTRAVENOUS Stopped (08/10/19 1256)  . heparin 1,150 Units/hr (08/11/19 0756)  . phenylephrine (NEO-SYNEPHRINE) Adult infusion 30 mcg/min (08/11/19 0700)  . piperacillin-tazobactam (ZOSYN)  IV 12.5 mL/hr at 08/11/19 0700  . propofol (DIPRIVAN) infusion 30 mcg/kg/min (08/11/19 0700)   PRN  Meds: Place/Maintain arterial line **AND** sodium chloride, sodium chloride, acetaminophen, fentaNYL, midazolam, sodium chloride flush   Vital Signs    Vitals:   08/11/19 0630 08/11/19 0645 08/11/19 0700 08/11/19 0736  BP: 98/76     Pulse: (!) 56     Resp: 19 (!) 21 (!) 21   Temp: 97.9 F (36.6 C) 98.1 F (36.7 C) 98.1 F (36.7 C)   TempSrc:      SpO2: 97%   100%  Weight:      Height:        Intake/Output Summary (Last 24 hours) at 08/11/2019 0912 Last data filed at 08/11/2019 0700 Gross per 24 hour  Intake 3052.34 ml  Output 4130 ml  Net -1077.66 ml   Filed Weights   08/10/19 0500 08/11/19 0000 08/11/19 0500  Weight: 90.7 kg 88.2 kg 88.2 kg    Physical Exam    GEN- The patient is chronically ill appearing. Intubated/sedated. Head- normocephalic, atraumatic Eyes-  Sclera clear, conjunctiva pink Ears- hearing intact Lungs- + Mechanical breathing sounds on vent.  Heart- LVAD hum GI- soft Extremities- + dependent edema into thighs Skin- no rash or lesion Psych- sedated.  Neuro- sedated.   Labs    CBC Recent Labs    08/10/19 0910 08/11/19 0359  WBC  11.4* 12.0*  HGB 9.2* 8.7*  HCT 31.4* 30.2*  MCV 94.0 96.2  PLT 258 536   Basic Metabolic Panel Recent Labs    08/10/19 0336 08/11/19 0359  NA 143 146*  K 4.2 3.9  CL 105 106  CO2 29 28  GLUCOSE 137* 178*  BUN 76* 64*  CREATININE 1.53* 1.48*  CALCIUM 8.5* 8.2*  MG 1.9 1.9   Liver Function Tests Recent Labs    08/10/19 0336 08/11/19 0359  AST 87* 78*  ALT 47* 46*  ALKPHOS 120 105  BILITOT 0.7 0.8  PROT 6.0* 5.8*  ALBUMIN 2.0* 1.9*   No results for input(s): LIPASE, AMYLASE in the last 72 hours. Cardiac Enzymes No results for input(s): CKTOTAL, CKMB, CKMBINDEX, TROPONINI in the last 72 hours.   Telemetry    SB/NSR 50-60s, occasional PVCs noted, more yesterday than today. (personally reviewed)  Radiology    DG Chest Port 1 View  Result Date: 08/11/2019 CLINICAL DATA:  Follow-up  endotracheal tube. EXAM: PORTABLE CHEST 1 VIEW COMPARISON:  08/09/2019 FINDINGS: Cardiac shadow is enlarged but stable. LVAD is noted in place. Endotracheal tube, gastric catheter and right-sided PICC line are noted and stable. Diffuse airspace opacities are again identified throughout both lungs slightly greater on the right than the left stable in appearance from the prior exam. No new focal infiltrate or effusion is noted. Linear density is noted over the left base new from the prior study likely related to extrinsic artifact. IMPRESSION: Stable appearance of the chest when compare with the previous day. No new focal abnormality is noted. Electronically Signed   By: Inez Catalina M.D.   On: 08/11/2019 08:05    Patient Profile     Aaron [name] is a 54 y.o. male with a past medical history significant for CAD and recent LVAD placement. Post op course complicated by incessant VT. Quieted and transferred to CIR where he had recurrent VF. Re-admitted to ICU.   - Had refractory VF throughout the night 7/28 w/ ~10 shocks. Now intubated.  - Developed bradycardia and further VF 7/30 am, TTVP + IABP placed 7/30 am.  - 8/1 completed cefepime, switched to ancef. Had VF 08/02/19 shock x1  - 8/3 Echo ramp, increased speed to 5400. VF x3. Failed IABP wean. Speed decreased to 5300.  - 08/05/19 VF shock x1 - 8/5 IABP removed. VT shock x 3 when sedation weaned and patient agitated.  - 8/6 Extubated, VT with shock x 3, reintubated.   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  Now OFF lidocaine.  Remains on IV amiodarone.  Tolerating quinidine thus far with stable PR and QT. Continue to follow EKGs (ordered for tomorrow) BP does not allow beta blocker titration at this time  EP will continue to follow along.    For questions or updates, please contact [place] Please consult [email or web address] for contact info under Cardiology/STEMI.  Signed, Shirley Friar, PA-C  08/11/2019, 9:12 AM    I have seen, examined the patient, and reviewed the above assessment and plan.  Changes to above are made where necessary.  On exam, ill appearing, intubated  Arrhythmia remains quiescent off of lidocaine with oral quinidine. Continue current treatment.  Hopefully, we can start weaning sedation soon.  He remains quite ill.   Co Sign: Thompson Grayer, MD 08/11/2019 9:22 AM

## 2019-08-11 NOTE — Progress Notes (Addendum)
Patient ID: Aaron [name], male   DOB: 1965-08-14, 54 y.o.   MRN: [id number]   Advanced Heart Failure VAD Team Note  PCP-Cardiologist: No primary care provider on file.   Subjective:    - Had refractory VF throughout the night 7/28 w/ ~10 shocks. Now intubated.  - Developed bradycardia and further VF 7/30 am, TTVP + IABP placed 7/30 am.   - 8/1 completed cefepime, switched to ancef. Had VF 08/02/19 shock x1  - 8/3 Echo ramp, increased speed to 5400. VF x3. Failed IABP wean. Speed decreased to 5300.   - 08/05/19 VF shock x1 - 8/5 IABP removed.  VT shock x 3 when sedation weaned and patient agitated.  - 8/6 Extubated, VT with shock x 3, reintubated.  -8/8 Lidocaine stopped. Quinidine started   Rhythm stable. No further VT/VF since starting quinidine. EP following  K 3.9 Mg 1.9   Continues to diurese well w/ IV Lasix. -3.7L out yesterday. SCr lower, 1.48 today. CVP down to 9.    Continues w/ bleeding. Had rectal clot yesterday. RN reports clot from mouth early this AM. Hgb trending down, 92.>>8.7.   Co-ox 71%.    On abx for UTI. mTemp past 24 hrs 101. WBC trending up slowly, 10>>11>>12. PCT 0.21 today.    LVAD INTERROGATION:  HeartMate 3 LVAD:   Flow 4.5 liters/min, speed 5350, power 3.9, PI 2.7.  No PI events   Objective:    Vital Signs:   Temp:  [97.5 F (36.4 C)-101.3 F (38.5 C)] 98.1 F (36.7 C) (08/10 0700) Pulse Rate:  [27-83] 56 (08/10 0630) Resp:  [16-33] 21 (08/10 0700) BP: (60-109)/(34-96) 98/76 (08/10 0630) SpO2:  [84 %-100 %] 100 % (08/10 0736) Arterial Line BP: (49-116)/(37-89) 85/75 (08/10 0700) FiO2 (%):  [40 %] 40 % (08/10 0736) Weight:  [88.2 kg] 88.2 kg (08/10 0500) Last BM Date: 08/10/19 Mean arterial Pressure 70s   Intake/Output:   Intake/Output Summary (Last 24 hours) at 08/11/2019 0812 Last data filed at 08/11/2019 0700 Gross per 24 hour  Intake 2869.63 ml  Output 4480 ml  Net -1610.37 ml     Physical Exam   CVP 9  General:  Intubated and  sedated HEENT: + ETTNormal. Neck: Supple, JVP ~9 cm. Carotids OK.  Cardiac:  Mechanical heart sounds with LVAD hum present.  Lungs:  Intubated and clear. No wheezing  Abdomen:  NT, ND, no HSM. No bruits or masses. +BS  LVAD exit site: Well-healed and incorporated. Dressing dry and intact. No erythema or drainage. Stabilization device present and accurately applied. Driveline dressing changed daily per sterile technique. Extremities:  Warm and dry. No cyanosis, clubbing, rash.  1+ bilateral LEE  Neuro:  Intubated and sedated  GU: Foley   Telemetry   NSR/sinus brady, upper 50s- 60s. No VT/VF in the last 24 hrs (personally reviewed)  Labs   Basic Metabolic Panel: Recent Labs  Lab 08/05/19 1644 08/05/19 1644 08/05/19 1749 08/06/19 0456 08/07/19 1546 08/07/19 1546 08/07/19 1946 08/07/19 1946 08/08/19 0310 08/08/19 0310 08/09/19 0340 08/10/19 0336 08/11/19 0359  NA 135   < > 136   < > 135   < > 138  --  136  --  138 143 146*  K 5.2*   < > 4.7   < > 4.3   < > 4.0  --  3.9  --  4.1 4.2 3.9  CL 101   < > 101   < > 99   < > 99  --  100  --  99 105 106  CO2 24   < > 23   < > 22   < > 26  --  25  --  '26 29 28  ' GLUCOSE 151*   < > 133*   < > 169*   < > 150*  --  108*  --  220* 137* 178*  BUN 88*   < > 84*   < > 103*   < > 103*  --  99*  --  85* 76* 64*  CREATININE 1.91*   < > 1.94*   < > 2.10*   < > 2.11*  --  1.95*  --  1.61* 1.53* 1.48*  CALCIUM 8.1*   < > 8.2*   < > 8.7*   < > 8.4*   < > 8.7*   < > 8.5* 8.5* 8.2*  MG  --   --   --    < > 2.0  --   --   --  2.3  --  2.0 1.9 1.9  PHOS 4.2  --  4.1  --  4.7*  --   --   --   --   --   --   --   --    < > = values in this interval not displayed.    Liver Function Tests: Recent Labs  Lab 08/07/19 1546 08/10/19 0336 08/11/19 0359  AST 87* 87* 78*  ALT 25 47* 46*  ALKPHOS 157* 120 105  BILITOT 0.6 0.7 0.8  PROT 6.4* 6.0* 5.8*  ALBUMIN 2.2* 2.0* 1.9*   No results for input(s): LIPASE, AMYLASE in the last 168 hours. No results  for input(s): AMMONIA in the last 168 hours.  CBC: Recent Labs  Lab 08/07/19 1546 08/07/19 1546 08/08/19 0310 08/09/19 0340 08/10/19 0336 08/10/19 0910 08/11/19 0359  WBC 15.5*   < > 14.0* 11.1* 10.8* 11.4* 12.0*  NEUTROABS 13.2*  --   --   --   --   --   --   HGB 9.7*   < > 9.0* 9.1* 9.1* 9.2* 8.7*  HCT 31.6*   < > 29.9* 29.9* 31.3* 31.4* 30.2*  MCV 90.5   < > 92.0 93.4 95.7 94.0 96.2  PLT 277   < > 245 237 246 258 256   < > = values in this interval not displayed.    INR: Recent Labs  Lab 08/05/19 0441 08/06/19 0456 08/07/19 0528 08/08/19 0310  INR 1.3* 1.2 1.2 1.3*    Other results:    Imaging   DG Chest Port 1 View  Result Date: 08/11/2019 CLINICAL DATA:  Follow-up endotracheal tube. EXAM: PORTABLE CHEST 1 VIEW COMPARISON:  08/09/2019 FINDINGS: Cardiac shadow is enlarged but stable. LVAD is noted in place. Endotracheal tube, gastric catheter and right-sided PICC line are noted and stable. Diffuse airspace opacities are again identified throughout both lungs slightly greater on the right than the left stable in appearance from the prior exam. No new focal infiltrate or effusion is noted. Linear density is noted over the left base new from the prior study likely related to extrinsic artifact. IMPRESSION: Stable appearance of the chest when compare with the previous day. No new focal abnormality is noted. Electronically Signed   By: Inez Catalina M.D.   On: 08/11/2019 08:05     Medications:     Scheduled Medications: . alteplase  2 mg Intracatheter Once  . aspirin  81 mg Per Tube Daily  .  atorvastatin  80 mg Per Tube Daily  . bisacodyl  10 mg Rectal Once  . chlorhexidine gluconate (MEDLINE KIT)  15 mL Mouth Rinse BID  . Chlorhexidine Gluconate Cloth  6 each Topical Daily  . clonazePAM  1 mg Per Tube BID  . docusate  100 mg Per Tube BID  . feeding supplement (PROSource TF)  90 mL Per Tube BID  . folic acid  1 mg Per Tube Daily  . free water  20 mL Per Tube Q4H    . furosemide  40 mg Intravenous BID  . gabapentin  300 mg Per Tube Q8H  . insulin aspart  0-15 Units Subcutaneous Q4H  . insulin aspart  3 Units Subcutaneous Q4H  . mouth rinse  15 mL Mouth Rinse 10 times per day  . midodrine  15 mg Per Tube TID WC  . multivitamin  15 mL Per Tube Daily  . pantoprazole (PROTONIX) IV  40 mg Intravenous Q12H  . polyethylene glycol  17 g Oral Daily  . potassium chloride  40 mEq Per Tube BID  . quiNIDine sulfate  150 mg Per Tube Q6H  . sildenafil  20 mg Per Tube TID  . sodium chloride flush  10-40 mL Intracatheter Q12H  . thiamine  100 mg Per Tube Daily    Infusions: . sodium chloride    . sodium chloride Stopped (08/05/19 1236)  . sodium chloride 20 mL/hr at 08/01/19 1700  . amiodarone 30 mg/hr (08/11/19 0700)  . feeding supplement (VITAL 1.5 CAL) 1,000 mL (08/10/19 2000)  . fentaNYL infusion INTRAVENOUS Stopped (08/10/19 1256)  . heparin 1,150 Units/hr (08/11/19 0756)  . phenylephrine (NEO-SYNEPHRINE) Adult infusion 30 mcg/min (08/11/19 0700)  . piperacillin-tazobactam (ZOSYN)  IV 12.5 mL/hr at 08/11/19 0700  . propofol (DIPRIVAN) infusion 30 mcg/kg/min (08/11/19 0700)    PRN Medications: Place/Maintain arterial line **AND** sodium chloride, sodium chloride, acetaminophen, fentaNYL, midazolam, sodium chloride flush   Assessment/Plan:    1. Refractory Ventricular Fibrillation  -multiple episodes ~10 of VF on 7/28 - multiple episodes 7/30 am => IABP placed as well as TTVP given relative bradycardia.  IABP and TTVP now out.  Continued to have VF 7/30 , 7/31, 8/1, 8/3, 8/4, 8/6.    - Ranexa and Mexiletine currently on hold (unable to crush and administer through tube).  - Sotalol not a good option given his history of bradycardia.  - Lidocaine discontinued 8/8 - now on quinidine 150 mg q6h. No VTVF overnight.  - Continue amiodarone gtt at 30 mg/hr.   - Keep K > 4.0 and Mg > 2.0. - Unable to tolerate extubation on 8/6 due to VT => occurs when  he wakes up, presumably with stress.  Stable currently as intubated and sedated.  - Will follow ECG closely w/ quinidine.  If he continues to tolerate, will try weaning vent and extubating on quinidine.   - Appreciate EP's assistance   2. Acute Systolic Heart Failure s/p VAD - EF 39%, RV systolic function mildly reduced - HMIII LVAD placed 06/25/19.  - RV looks good on echo, cannula ok  - Co-ox 71% - avoid inotropes with VF, remains on midodrine 15 mg tid + phenylephrine 30 mcg/min.  - Off digoxin with bradycardia/AKI.  - Continue Heparin gtt, off warfarin currently. LDH stable at 238.   - Volume status improved w/ IV Lasix, CVP 9 today. Continue 40 IV BID - Creatinine lower today 1.48   3. Acute hypoxic resp failure - intubated in setting of  VF - vent management per PCCM.  When vent is weaned and he is awake, has VT/VF.  As above, will see if we can keep arrhythmias quiet on quinidine + amiodarone and try again to extubate.   4. CAD - S/p Acute STEMI 6/21.Emergent cath showed severe multivessel disease w/ 75% mid- distal LM, 100% prox LAD, not amendable to PCI (unable to cross w/ guide wire, probable CTO), 100% prox LCx (probable CTO) and 100% ostial RCA (probable CTO). There were mildto moderate collaterals right to left to the LAD, minimal right to right collateralsan minimalleft to left collaterals.LVEF 25% -Hs trop peaked 27,000. - No targets for CABG / PCI.  - S/P HMIII VAD - Continue ASA + statin   5. Renal mass = RCC - MRI abdomen- 1.8 cm right renal lesion, most consistent with renal cell carcinoma. No evidence of renal vein involvement or nodal metastasis. - Discussed w/ Urology and R RCC is small and will just require surveillance. Ok to proceed with VAD from their standpoint.   6. Urinary retention/UA - continue abx - remove and replace foley   7. ID:  - ?HCAP => Klebsiella in trach aspirate from 7/29.  - Completed cefepime 08/02/19, switched to ancef  (stopped 8/3) - Blood CX repeated 08/04/19, NGTD - Tm 101.3 WBC trending up slowly, 10>>11>>12. Treating for UTI.  - continue abx for UTI, remove and replace foley, re-culture urine    8. Anemia w/ Concern for GI Source  - RN noted mild rectal bleeding 8/8 and blood clot from mouth today - hgb slightly lower today, 9.2>>8.7 - on anticoagulation for LVAD - obtain GI consult for possible scope while he is currently intubated  - transfuse if hgb drops <7. 0   9. AKI - Creatinine 1.87>>2.13>>1.99>>1.65>>1.53>>1.43 - follow BMP    Lyda Jester, PA-C 08/11/2019  8:12 AM   Agree with above.  Remains intubated/sedated on propofol. Diuresing well on IV lasix. VT/VF quiescent with quinidine and amio. QT ok. Had blood in esophagus overnight but no recurrent melena. Had urinary retention with UTI. No urinary culture sent. On vanc/zosyn. VAD interrogated personally. Parameters stable.  General:  Intubated/sedated HEENT: normal + ETT Neck: supple. JVP 9  Carotids 2+ bilat; no bruits. No lymphadenopathy or thryomegaly appreciated. Cor: LVAD hum.  Lungs: Coarse Abdomen: soft, nontender, non-distended. No hepatosplenomegaly. No bruits or masses. Good bowel sounds. Driveline site clean. Anchor in place.  Extremities: no cyanosis, clubbing, rash. Warm no edema  Neuro: intubated sedated   VT/VF currently stable. Continue amio and qunidine. D/w CCM. Will begin to lighten sedation today and try to keep him awake on vent to see if he tolerates stimulation without recurrent VT/VF.  Continue abx. Send ucx. Pull Foley. Watch for recurrent retention. Diurese one more day. VAD interrogated personally. Parameters stable. Continue heparin. Can likely restart warfarin soon but need to make sure he doesn't need trach first.   CRITICAL CARE Performed by: Glori Bickers  Total critical care time: 35 minutes  Critical care time was exclusive of separately billable procedures and treating other  patients.  Critical care was necessary to treat or prevent imminent or life-threatening deterioration.  Critical care was time spent personally by me (independent of midlevel providers or residents) on the following activities: development of treatment plan with patient and/or surrogate as well as nursing, discussions with consultants, evaluation of patient's response to treatment, examination of patient, obtaining history from patient or surrogate, ordering and performing treatments and interventions, ordering and review of  laboratory studies, ordering and review of radiographic studies, pulse oximetry and re-evaluation of patient's condition.  Glori Bickers, MD  9:39 AM

## 2019-08-11 NOTE — H&P (View-Only) (Signed)
Referring Provider: Ellen Henri, PA-C Primary Care Physician:  Patient, No Pcp Per Primary Gastroenterologist:  Althia Forts  Reason for Consultation: Upper GI Bleeding  HPI: Aaron [name] is a 54 y.o. male with past medical history noted below to include refractory V fib, acute systolic HF s/p VAD (9/70/26), acute hypoxic respiratory failure, CAD s/p acute STEMI (06/2019), on Heparin and ASA presenting with upper GI bleeding.  Pt is intubated and sedated and is unable to provide any subjective information.    Per RN, patient has had black stools today as well red blood and clot seen in patient's mouth today.  Spoke with patient's sister, Erick Colace, who states pt has not had any prior GI bleeding and has never had an EGD.  Past Medical History:  Diagnosis Date   Acute anterolateral wall MI (HCC)    CAD, multiple vessel    Hypertension    Ischemic cardiomyopathy    Systolic heart failure ([place])    Tobacco abuse     Past Surgical History:  Procedure Laterality Date   CARDIOVERSION N/A 07/02/2019   Procedure: CARDIOVERSION;  Surgeon: Jolaine Artist, MD;  Location: [place];  Service: Cardiovascular;  Laterality: N/A;   CARDIOVERSION N/A 08/01/2019   Procedure: CARDIOVERSION;  Surgeon: Jolaine Artist, MD;  Location: [facility] OR;  Service: Cardiovascular;  Laterality: N/A;   CORONARY/GRAFT ACUTE MI REVASCULARIZATION N/A 06/17/2019   Procedure: Coronary/Graft Acute MI Revascularization;  Surgeon: Yolonda Kida, MD;  Location: [place] [facility];  Service: Cardiovascular;  Laterality: N/A;   IABP INSERTION N/A 06/17/2019   Procedure: IABP Insertion;  Surgeon: Yolonda Kida, MD;  Location: [place] [facility];  Service: Cardiovascular;  Laterality: N/A;   IABP INSERTION N/A 06/24/2019   Procedure: IABP INSERTION;  Surgeon: Jolaine Artist, MD;  Location: [place] [facility];  Service: Cardiovascular;  Laterality: N/A;   IABP INSERTION N/A 07/09/2019    Procedure: IABP Insertion;  Surgeon: Jolaine Artist, MD;  Location: [place] [facility];  Service: Cardiovascular;  Laterality: N/A;   IABP INSERTION N/A 07/12/2019   Procedure: IABP Insertion;  Surgeon: Jolaine Artist, MD;  Location: [place] [facility];  Service: Cardiovascular;  Laterality: N/A;   INSERTION OF IMPLANTABLE LEFT VENTRICULAR ASSIST DEVICE N/A 06/25/2019   Procedure: INSERTION OF IMPLANTABLE LEFT VENTRICULAR ASSIST DEVICE USING THORATEC HEARTMATE 3;  Surgeon: Wonda Olds, MD;  Location: [place];  Service: Open Heart Surgery;  Laterality: N/A;   LEFT HEART CATH AND CORONARY ANGIOGRAPHY N/A 06/17/2019   Procedure: LEFT HEART CATH AND CORONARY ANGIOGRAPHY;  Surgeon: Yolonda Kida, MD;  Location: [place] [facility];  Service: Cardiovascular;  Laterality: N/A;   RIGHT HEART CATH N/A 06/17/2019   Procedure: RIGHT HEART CATH;  Surgeon: Yolonda Kida, MD;  Location: [place] [facility];  Service: Cardiovascular;  Laterality: N/A;   RIGHT HEART CATH N/A 06/24/2019   Procedure: RIGHT HEART CATH;  Surgeon: Jolaine Artist, MD;  Location: [place] [facility];  Service: Cardiovascular;  Laterality: N/A;   RIGHT HEART CATH N/A 07/09/2019   Procedure: RIGHT HEART CATH;  Surgeon: Jolaine Artist, MD;  Location: [place] [facility];  Service: Cardiovascular;  Laterality: N/A;   TEE WITHOUT CARDIOVERSION N/A 06/25/2019   Procedure: TRANSESOPHAGEAL ECHOCARDIOGRAM (TEE);  Surgeon: Wonda Olds, MD;  Location: [place];  Service: Open Heart Surgery;  Laterality: N/A;   TEMPORARY PACEMAKER N/A 07/18/2019   Procedure: TEMPORARY PACEMAKER;  Surgeon: Jolaine Artist,  MD;  Location: [place] [facility];  Service: Cardiovascular;  Laterality: N/A;    Prior to Admission medications   Medication Sig Start Date End Date Taking? Authorizing Provider  acetaminophen (TYLENOL) 325 MG tablet Take 2 tablets (650 mg total) by mouth every 4 (four) hours as needed for headache or  mild pain. 07/18/2019   Angiulli, Lavon Paganini, PA-C  ALPRAZolam Duanne Moron) 0.25 MG tablet Take 1 tablet (0.25 mg total) by mouth 3 (three) times daily as needed for anxiety. 07/09/2019   Angiulli, Lavon Paganini, PA-C  amiodarone (PACERONE) 400 MG tablet Take 1 tablet (400 mg total) by mouth 2 (two) times daily. 07/23/19   Consuelo Pandy, PA-C  aspirin 81 MG chewable tablet Chew 1 tablet (81 mg total) by mouth daily. 07/23/19   Lyda Jester M, PA-C  atorvastatin (LIPITOR) 80 MG tablet Take 1 tablet (80 mg total) by mouth daily. 07/23/19   Lyda Jester M, PA-C  clonazePAM (KLONOPIN) 0.5 MG tablet Take 1 tablet (0.5 mg total) by mouth 2 (two) times daily. 07/27/2019   Angiulli, Lavon Paganini, PA-C  digoxin (LANOXIN) 0.125 MG tablet Take 1 tablet (0.125 mg total) by mouth daily. 07/23/19   Lyda Jester M, PA-C  folic acid (FOLVITE) 1 MG tablet Take 1 tablet (1 mg total) by mouth daily. 07/23/19   Lyda Jester M, PA-C  gabapentin (NEURONTIN) 600 MG tablet Take 0.5 tablets (300 mg total) by mouth 3 (three) times daily. 07/23/19   Lyda Jester M, PA-C  melatonin 5 MG TABS Take 1 tablet (5 mg total) by mouth daily after supper. 07/24/2019   Angiulli, Lavon Paganini, PA-C  Multiple Vitamin (MULTIVITAMIN WITH MINERALS) TABS tablet Take 1 tablet by mouth daily. 07/24/2019   Angiulli, Lavon Paganini, PA-C  oxyCODONE (OXY IR/ROXICODONE) 5 MG immediate release tablet Take 1-2 tablets (5-10 mg total) by mouth every 3 (three) hours as needed for severe pain. 07/07/2019   Angiulli, Lavon Paganini, PA-C  pantoprazole (PROTONIX) 40 MG tablet Take 1 tablet (40 mg total) by mouth daily. 07/23/19   Lyda Jester M, PA-C  polyvinyl alcohol (LIQUIFILM TEARS) 1.4 % ophthalmic solution Place 1 drop into both eyes as needed for dry eyes. 07/02/2019   Angiulli, Lavon Paganini, PA-C  ranolazine (RANEXA) 500 MG 12 hr tablet Take 1 tablet (500 mg total) by mouth 2 (two) times daily. 07/23/19   Lyda Jester M, PA-C  sildenafil (REVATIO) 20 MG tablet Take  1 tablet (20 mg total) by mouth 3 (three) times daily. 07/24/19   Lyda Jester M, PA-C  thiamine 100 MG tablet Take 1 tablet (100 mg total) by mouth daily. 07/23/19   Lyda Jester M, PA-C  traMADol (ULTRAM) 50 MG tablet Take 1-2 tablets (50-100 mg total) by mouth every 4 (four) hours as needed for moderate pain. 07/27/2019   Angiulli, Lavon Paganini, PA-C  traZODone (DESYREL) 100 MG tablet Take 1 tablet (100 mg total) by mouth at bedtime as needed for sleep. 08/01/2019   Angiulli, Lavon Paganini, PA-C  warfarin (COUMADIN) 6 MG tablet Take 1 tablet (6 mg total) by mouth one time only at 4 PM. 07/02/2019   Angiulli, Lavon Paganini, PA-C    Scheduled Meds:  alteplase  2 mg Intracatheter Once   aspirin  81 mg Per Tube Daily   atorvastatin  80 mg Per Tube Daily   bisacodyl  10 mg Rectal Once   chlorhexidine gluconate (MEDLINE KIT)  15 mL Mouth Rinse BID   Chlorhexidine Gluconate Cloth  6 each Topical  Daily   clonazePAM  1 mg Per Tube BID   docusate  100 mg Per Tube BID   feeding supplement (PROSource TF)  90 mL Per Tube BID   folic acid  1 mg Per Tube Daily   free water  20 mL Per Tube Q4H   furosemide  40 mg Intravenous BID   gabapentin  300 mg Per Tube Q8H   insulin aspart  0-15 Units Subcutaneous Q4H   insulin aspart  3 Units Subcutaneous Q4H   mouth rinse  15 mL Mouth Rinse 10 times per day   midodrine  15 mg Per Tube TID WC   multivitamin  15 mL Per Tube Daily   pantoprazole (PROTONIX) IV  40 mg Intravenous Q12H   polyethylene glycol  17 g Oral Daily   potassium chloride  40 mEq Per Tube TID   quiNIDine sulfate  150 mg Per Tube Q6H   sildenafil  20 mg Per Tube TID   sodium chloride flush  10-40 mL Intracatheter Q12H   thiamine  100 mg Per Tube Daily   Continuous Infusions:  sodium chloride     sodium chloride Stopped (08/05/19 1236)   sodium chloride 20 mL/hr at 08/01/19 1700   amiodarone 30 mg/hr (08/11/19 1227)   feeding supplement (VITAL 1.5 CAL) 1,000 mL  (08/10/19 2000)   fentaNYL infusion INTRAVENOUS Stopped (08/10/19 1256)   heparin 1,150 Units/hr (08/11/19 0756)   phenylephrine (NEO-SYNEPHRINE) Adult infusion 30 mcg/min (08/11/19 0700)   piperacillin-tazobactam (ZOSYN)  IV 12.5 mL/hr at 08/11/19 0700   propofol (DIPRIVAN) infusion 10 mcg/kg/min (08/11/19 0959)   PRN Meds:.Place/Maintain arterial line **AND** sodium chloride, sodium chloride, acetaminophen, fentaNYL, midazolam, sodium chloride flush  Allergies as of 07/11/2019 - Review Complete 07/21/2019  Allergen Reaction Noted   Codeine Hives 06/18/2019    Family History  Problem Relation Age of Onset   CAD Father    Valvular heart disease Father     Social History   Socioeconomic History   Marital status: Married    Spouse name: Not on file   Number of children: Not on file   Years of education: Not on file   Highest education level: Not on file  Occupational History   Not on file  Tobacco Use   Smoking status: Current Every Day Smoker    Packs/day: 1.50    Years: 30.00    Pack years: 45.00   Smokeless tobacco: Never Used  Substance and Sexual Activity   Alcohol use: Yes    Alcohol/week: 7.0 - 14.0 standard drinks    Types: 7 - 14 Standard drinks or equivalent per week   Drug use: Never   Sexual activity: Not on file  Other Topics Concern   Not on file  Social History Narrative   Not on file   Social Determinants of Health   Financial Resource Strain:    Difficulty of Paying Living Expenses:   Food Insecurity:    Worried About Charity fundraiser in the Last Year:    Arboriculturist in the Last Year:   Transportation Needs:    Film/video editor (Medical):    Lack of Transportation (Non-Medical):   Physical Activity:    Days of Exercise per Week:    Minutes of Exercise per Session:   Stress:    Feeling of Stress :   Social Connections:    Frequency of Communication with Friends and Family:    Frequency of Social  Gatherings with Friends  and Family:    Attends Religious Services:    Active Member of Clubs or Organizations:    Attends Music therapist:    Marital Status:   Intimate Partner Violence:    Fear of Current or Ex-Partner:    Emotionally Abused:    Physically Abused:    Sexually Abused:     Review of Systems: Patient intubated and sedated and unable to provide any subjective data.  Physical Exam: Vital signs: Vitals:   08/11/19 0736 08/11/19 1101  BP:  (!) 86/77  Pulse:  67  Resp:  (!) 25  Temp:  99.1 F (37.3 C)  SpO2: 100% 98%   Last BM Date: 08/10/19  Physical Exam Vitals and nursing note reviewed.  Constitutional:      Interventions: He is sedated and intubated.  HENT:     Head: Normocephalic and atraumatic.     Nose: Nose normal.     Mouth/Throat:     Mouth: Mucous membranes are moist.     Pharynx: Oropharynx is clear.     Comments: OG tube in place with small amount of red blood in tube; small amount of red blood surrounding pt's mouth Eyes:     General: No scleral icterus.    Conjunctiva/sclera: Conjunctivae normal.  Cardiovascular:     Rate and Rhythm: Normal rate and regular rhythm.     Pulses: Normal pulses.  Pulmonary:     Effort: Pulmonary effort is normal. No respiratory distress. He is intubated.     Breath sounds: Normal breath sounds.  Abdominal:     General: Bowel sounds are normal. There is no distension.     Palpations: Abdomen is soft. There is no mass.     Tenderness: There is no abdominal tenderness (no grimace upon palpation). There is no guarding.     Hernia: No hernia is present.  Musculoskeletal:        General: No swelling or tenderness.     Cervical back: Normal range of motion and neck supple.     Comments: SCDs in place  Skin:    General: Skin is warm and dry.  Neurological:     Comments: Intubated/sedated  Psychiatric:     Comments: Unable to assess: intubated/sedated     GI:  Lab Results: Recent  Labs    08/10/19 0336 08/10/19 0910 08/11/19 0359  WBC 10.8* 11.4* 12.0*  HGB 9.1* 9.2* 8.7*  HCT 31.3* 31.4* 30.2*  PLT 246 258 256   BMET Recent Labs    08/09/19 0340 08/10/19 0336 08/11/19 0359  NA 138 143 146*  K 4.1 4.2 3.9  CL 99 105 106  CO2 '26 29 28  ' GLUCOSE 220* 137* 178*  BUN 85* 76* 64*  CREATININE 1.61* 1.53* 1.48*  CALCIUM 8.5* 8.5* 8.2*   LFT Recent Labs    08/11/19 0359  PROT 5.8*  ALBUMIN 1.9*  AST 78*  ALT 46*  ALKPHOS 105  BILITOT 0.8   PT/INR No results for input(s): LABPROT, INR in the last 72 hours.   Studies/Results: DG Chest Port 1 View  Result Date: 08/11/2019 CLINICAL DATA:  Follow-up endotracheal tube. EXAM: PORTABLE CHEST 1 VIEW COMPARISON:  08/09/2019 FINDINGS: Cardiac shadow is enlarged but stable. LVAD is noted in place. Endotracheal tube, gastric catheter and right-sided PICC line are noted and stable. Diffuse airspace opacities are again identified throughout both lungs slightly greater on the right than the left stable in appearance from the prior exam. No new focal infiltrate or  effusion is noted. Linear density is noted over the left base new from the prior study likely related to extrinsic artifact. IMPRESSION: Stable appearance of the chest when compare with the previous day. No new focal abnormality is noted. Electronically Signed   By: Inez Catalina M.D.   On: 08/11/2019 08:05    Impression: Suspected Upper GI Bleeding in the setting of Heparin and aspirin use -Melena and red blood in patient's mouth and OG tube today -Hgb 8.7 today, progressively decreasing over the past week.  Yesterday, Hgb 9.2.   AKI: BUN/Cr down-trending. Currently BUN 64/ Cr 1.48  Refractory V fib, acute systolic HF, acute hypoxic respiratory failure, CAD/STEMI  Plan: Bedside EGD tomorrow.  I called patient's sister, Erick Colace, and discussed the procedure thoroughly to include nature, alternatives, benefits, and risks (including but not limited  to bleeding, infection, perforation, cardiac/pulmonary complications).  Patient sister [name] verbalized understanding and gave verbal consent to proceed with EGD. Consent confirmed by RN.  OK per Dr. Haroldine Laws (cardiology) to hold heparin 4 hours prior to procedure.  EGD scheduled for 9:45am tomorrow, hold Heparin starting at 5:45am on 8/11.  Continue Protonix 40 mg IV twice daily.  Continue to monitor H&H with transfusion as needed to maintain hemoglobin greater than 8.  [facility] will follow.   LOS: 14 days   Salley Slaughter  PA-C 08/11/2019, 12:50 PM  Contact #  [phone]

## 2019-08-11 NOTE — Consult Note (Addendum)
Referring Provider: Ellen Henri, PA-C Primary Care Physician:  Patient, No Pcp Per Primary Gastroenterologist:  Althia Forts  Reason for Consultation: Upper GI Bleeding  HPI: Aaron [name] is a 54 y.o. male with past medical history noted below to include refractory V fib, acute systolic HF s/p VAD (3/55/97), acute hypoxic respiratory failure, CAD s/p acute STEMI (06/2019), on Heparin and ASA presenting with upper GI bleeding.  Pt is intubated and sedated and is unable to provide any subjective information.    Per RN, patient has had black stools today as well red blood and clot seen in patient's mouth today.  Spoke with patient's sister, Erick Colace, who states pt has not had any prior GI bleeding and has never had an EGD.  Past Medical History:  Diagnosis Date  . Acute anterolateral wall MI ([place])   . CAD, multiple vessel   . Hypertension   . Ischemic cardiomyopathy   . Systolic heart failure ([place])   . Tobacco abuse     Past Surgical History:  Procedure Laterality Date  . CARDIOVERSION N/A 07/02/2019   Procedure: CARDIOVERSION;  Surgeon: Jolaine Artist, MD;  Location: [place];  Service: Cardiovascular;  Laterality: N/A;  . CARDIOVERSION N/A 07/23/2019   Procedure: CARDIOVERSION;  Surgeon: Jolaine Artist, MD;  Location: [facility] OR;  Service: Cardiovascular;  Laterality: N/A;  . CORONARY/GRAFT ACUTE MI REVASCULARIZATION N/A 06/17/2019   Procedure: Coronary/Graft Acute MI Revascularization;  Surgeon: Yolonda Kida, MD;  Location: [place] CV LAB;  Service: Cardiovascular;  Laterality: N/A;  . IABP INSERTION N/A 06/17/2019   Procedure: IABP Insertion;  Surgeon: Yolonda Kida, MD;  Location: [place] [facility] LAB;  Service: Cardiovascular;  Laterality: N/A;  . IABP INSERTION N/A 06/24/2019   Procedure: IABP INSERTION;  Surgeon: Jolaine Artist, MD;  Location: [place] CV LAB;  Service: Cardiovascular;  Laterality: N/A;  . IABP INSERTION N/A 07/09/2019    Procedure: IABP Insertion;  Surgeon: Jolaine Artist, MD;  Location: [place] [facility] LAB;  Service: Cardiovascular;  Laterality: N/A;  . IABP INSERTION N/A 07/10/2019   Procedure: IABP Insertion;  Surgeon: Jolaine Artist, MD;  Location: [place] [facility] LAB;  Service: Cardiovascular;  Laterality: N/A;  . INSERTION OF IMPLANTABLE LEFT VENTRICULAR ASSIST DEVICE N/A 06/25/2019   Procedure: INSERTION OF IMPLANTABLE LEFT VENTRICULAR ASSIST DEVICE USING THORATEC HEARTMATE 3;  Surgeon: Wonda Olds, MD;  Location: [place];  Service: Open Heart Surgery;  Laterality: N/A;  . LEFT HEART CATH AND CORONARY ANGIOGRAPHY N/A 06/17/2019   Procedure: LEFT HEART CATH AND CORONARY ANGIOGRAPHY;  Surgeon: Yolonda Kida, MD;  Location: [place] CV LAB;  Service: Cardiovascular;  Laterality: N/A;  . RIGHT HEART CATH N/A 06/17/2019   Procedure: RIGHT HEART CATH;  Surgeon: Yolonda Kida, MD;  Location: [place] CV LAB;  Service: Cardiovascular;  Laterality: N/A;  . RIGHT HEART CATH N/A 06/24/2019   Procedure: RIGHT HEART CATH;  Surgeon: Jolaine Artist, MD;  Location: [place] CV LAB;  Service: Cardiovascular;  Laterality: N/A;  . RIGHT HEART CATH N/A 07/09/2019   Procedure: RIGHT HEART CATH;  Surgeon: Jolaine Artist, MD;  Location: [place] [facility] LAB;  Service: Cardiovascular;  Laterality: N/A;  . TEE WITHOUT CARDIOVERSION N/A 06/25/2019   Procedure: TRANSESOPHAGEAL ECHOCARDIOGRAM (TEE);  Surgeon: Wonda Olds, MD;  Location: [place];  Service: Open Heart Surgery;  Laterality: N/A;  . TEMPORARY PACEMAKER N/A 07/09/2019   Procedure: TEMPORARY PACEMAKER;  Surgeon: Jolaine Artist,  MD;  Location: [place] CV LAB;  Service: Cardiovascular;  Laterality: N/A;    Prior to Admission medications   Medication Sig Start Date End Date Taking? Authorizing Provider  acetaminophen (TYLENOL) 325 MG tablet Take 2 tablets (650 mg total) by mouth every 4 (four) hours as needed for headache or  mild pain. 08/01/2019   Angiulli, Lavon Paganini, PA-C  ALPRAZolam Duanne Moron) 0.25 MG tablet Take 1 tablet (0.25 mg total) by mouth 3 (three) times daily as needed for anxiety. 07/21/2019   Angiulli, Lavon Paganini, PA-C  amiodarone (PACERONE) 400 MG tablet Take 1 tablet (400 mg total) by mouth 2 (two) times daily. 07/23/19   Consuelo Pandy, PA-C  aspirin 81 MG chewable tablet Chew 1 tablet (81 mg total) by mouth daily. 07/23/19   Lyda Jester M, PA-C  atorvastatin (LIPITOR) 80 MG tablet Take 1 tablet (80 mg total) by mouth daily. 07/23/19   Lyda Jester M, PA-C  clonazePAM (KLONOPIN) 0.5 MG tablet Take 1 tablet (0.5 mg total) by mouth 2 (two) times daily. 07/16/2019   Angiulli, Lavon Paganini, PA-C  digoxin (LANOXIN) 0.125 MG tablet Take 1 tablet (0.125 mg total) by mouth daily. 07/23/19   Lyda Jester M, PA-C  folic acid (FOLVITE) 1 MG tablet Take 1 tablet (1 mg total) by mouth daily. 07/23/19   Lyda Jester M, PA-C  gabapentin (NEURONTIN) 600 MG tablet Take 0.5 tablets (300 mg total) by mouth 3 (three) times daily. 07/23/19   Lyda Jester M, PA-C  melatonin 5 MG TABS Take 1 tablet (5 mg total) by mouth daily after supper. 07/10/2019   Angiulli, Lavon Paganini, PA-C  Multiple Vitamin (MULTIVITAMIN WITH MINERALS) TABS tablet Take 1 tablet by mouth daily. 07/02/2019   Angiulli, Lavon Paganini, PA-C  oxyCODONE (OXY IR/ROXICODONE) 5 MG immediate release tablet Take 1-2 tablets (5-10 mg total) by mouth every 3 (three) hours as needed for severe pain. 07/24/2019   Angiulli, Lavon Paganini, PA-C  pantoprazole (PROTONIX) 40 MG tablet Take 1 tablet (40 mg total) by mouth daily. 07/23/19   Lyda Jester M, PA-C  polyvinyl alcohol (LIQUIFILM TEARS) 1.4 % ophthalmic solution Place 1 drop into both eyes as needed for dry eyes. 07/13/2019   Angiulli, Lavon Paganini, PA-C  ranolazine (RANEXA) 500 MG 12 hr tablet Take 1 tablet (500 mg total) by mouth 2 (two) times daily. 07/23/19   Lyda Jester M, PA-C  sildenafil (REVATIO) 20 MG tablet Take  1 tablet (20 mg total) by mouth 3 (three) times daily. 07/24/19   Lyda Jester M, PA-C  thiamine 100 MG tablet Take 1 tablet (100 mg total) by mouth daily. 07/23/19   Lyda Jester M, PA-C  traMADol (ULTRAM) 50 MG tablet Take 1-2 tablets (50-100 mg total) by mouth every 4 (four) hours as needed for moderate pain. 07/26/2019   Angiulli, Lavon Paganini, PA-C  traZODone (DESYREL) 100 MG tablet Take 1 tablet (100 mg total) by mouth at bedtime as needed for sleep. 07/26/2019   Angiulli, Lavon Paganini, PA-C  warfarin (COUMADIN) 6 MG tablet Take 1 tablet (6 mg total) by mouth one time only at 4 PM. 07/21/2019   Angiulli, Lavon Paganini, PA-C    Scheduled Meds: . alteplase  2 mg Intracatheter Once  . aspirin  81 mg Per Tube Daily  . atorvastatin  80 mg Per Tube Daily  . bisacodyl  10 mg Rectal Once  . chlorhexidine gluconate (MEDLINE KIT)  15 mL Mouth Rinse BID  . Chlorhexidine Gluconate Cloth  6 each Topical  Daily  . clonazePAM  1 mg Per Tube BID  . docusate  100 mg Per Tube BID  . feeding supplement (PROSource TF)  90 mL Per Tube BID  . folic acid  1 mg Per Tube Daily  . free water  20 mL Per Tube Q4H  . furosemide  40 mg Intravenous BID  . gabapentin  300 mg Per Tube Q8H  . insulin aspart  0-15 Units Subcutaneous Q4H  . insulin aspart  3 Units Subcutaneous Q4H  . mouth rinse  15 mL Mouth Rinse 10 times per day  . midodrine  15 mg Per Tube TID WC  . multivitamin  15 mL Per Tube Daily  . pantoprazole (PROTONIX) IV  40 mg Intravenous Q12H  . polyethylene glycol  17 g Oral Daily  . potassium chloride  40 mEq Per Tube TID  . quiNIDine sulfate  150 mg Per Tube Q6H  . sildenafil  20 mg Per Tube TID  . sodium chloride flush  10-40 mL Intracatheter Q12H  . thiamine  100 mg Per Tube Daily   Continuous Infusions: . sodium chloride    . sodium chloride Stopped (08/05/19 1236)  . sodium chloride 20 mL/hr at 08/01/19 1700  . amiodarone 30 mg/hr (08/11/19 1227)  . feeding supplement (VITAL 1.5 CAL) 1,000 mL  (08/10/19 2000)  . fentaNYL infusion INTRAVENOUS Stopped (08/10/19 1256)  . heparin 1,150 Units/hr (08/11/19 0756)  . phenylephrine (NEO-SYNEPHRINE) Adult infusion 30 mcg/min (08/11/19 0700)  . piperacillin-tazobactam (ZOSYN)  IV 12.5 mL/hr at 08/11/19 0700  . propofol (DIPRIVAN) infusion 10 mcg/kg/min (08/11/19 0959)   PRN Meds:.Place/Maintain arterial line **AND** sodium chloride, sodium chloride, acetaminophen, fentaNYL, midazolam, sodium chloride flush  Allergies as of 07/06/2019 - Review Complete 07/23/2019  Allergen Reaction Noted  . Codeine Hives 06/18/2019    Family History  Problem Relation Age of Onset  . CAD Father   . Valvular heart disease Father     Social History   Socioeconomic History  . Marital status: Married    Spouse name: Not on file  . Number of children: Not on file  . Years of education: Not on file  . Highest education level: Not on file  Occupational History  . Not on file  Tobacco Use  . Smoking status: Current Every Day Smoker    Packs/day: 1.50    Years: 30.00    Pack years: 45.00  . Smokeless tobacco: Never Used  Substance and Sexual Activity  . Alcohol use: Yes    Alcohol/week: 7.0 - 14.0 standard drinks    Types: 7 - 14 Standard drinks or equivalent per week  . Drug use: Never  . Sexual activity: Not on file  Other Topics Concern  . Not on file  Social History Narrative  . Not on file   Social Determinants of Health   Financial Resource Strain:   . Difficulty of Paying Living Expenses:   Food Insecurity:   . Worried About Charity fundraiser in the Last Year:   . Arboriculturist in the Last Year:   Transportation Needs:   . Film/video editor (Medical):   Marland Kitchen Lack of Transportation (Non-Medical):   Physical Activity:   . Days of Exercise per Week:   . Minutes of Exercise per Session:   Stress:   . Feeling of Stress :   Social Connections:   . Frequency of Communication with Friends and Family:   . Frequency of Social  Gatherings with Friends  and Family:   . Attends Religious Services:   . Active Member of Clubs or Organizations:   . Attends Archivist Meetings:   Marland Kitchen Marital Status:   Intimate Partner Violence:   . Fear of Current or Ex-Partner:   . Emotionally Abused:   Marland Kitchen Physically Abused:   . Sexually Abused:     Review of Systems: Patient intubated and sedated and unable to provide any subjective data.  Physical Exam: Vital signs: Vitals:   08/11/19 0736 08/11/19 1101  BP:  (!) 86/77  Pulse:  67  Resp:  (!) 25  Temp:  99.1 F (37.3 C)  SpO2: 100% 98%   Last BM Date: 08/10/19  Physical Exam Vitals and nursing note reviewed.  Constitutional:      Interventions: He is sedated and intubated.  HENT:     Head: Normocephalic and atraumatic.     Nose: Nose normal.     Mouth/Throat:     Mouth: Mucous membranes are moist.     Pharynx: Oropharynx is clear.     Comments: OG tube in place with small amount of red blood in tube; small amount of red blood surrounding pt's mouth Eyes:     General: No scleral icterus.    Conjunctiva/sclera: Conjunctivae normal.  Cardiovascular:     Rate and Rhythm: Normal rate and regular rhythm.     Pulses: Normal pulses.  Pulmonary:     Effort: Pulmonary effort is normal. No respiratory distress. He is intubated.     Breath sounds: Normal breath sounds.  Abdominal:     General: Bowel sounds are normal. There is no distension.     Palpations: Abdomen is soft. There is no mass.     Tenderness: There is no abdominal tenderness (no grimace upon palpation). There is no guarding.     Hernia: No hernia is present.  Musculoskeletal:        General: No swelling or tenderness.     Cervical back: Normal range of motion and neck supple.     Comments: SCDs in place  Skin:    General: Skin is warm and dry.  Neurological:     Comments: Intubated/sedated  Psychiatric:     Comments: Unable to assess: intubated/sedated     GI:  Lab Results: Recent  Labs    08/10/19 0336 08/10/19 0910 08/11/19 0359  WBC 10.8* 11.4* 12.0*  HGB 9.1* 9.2* 8.7*  HCT 31.3* 31.4* 30.2*  PLT 246 258 256   BMET Recent Labs    08/09/19 0340 08/10/19 0336 08/11/19 0359  NA 138 143 146*  K 4.1 4.2 3.9  CL 99 105 106  CO2 '26 29 28  ' GLUCOSE 220* 137* 178*  BUN 85* 76* 64*  CREATININE 1.61* 1.53* 1.48*  CALCIUM 8.5* 8.5* 8.2*   LFT Recent Labs    08/11/19 0359  PROT 5.8*  ALBUMIN 1.9*  AST 78*  ALT 46*  ALKPHOS 105  BILITOT 0.8   PT/INR No results for input(s): LABPROT, INR in the last 72 hours.   Studies/Results: DG Chest Port 1 View  Result Date: 08/11/2019 CLINICAL DATA:  Follow-up endotracheal tube. EXAM: PORTABLE CHEST 1 VIEW COMPARISON:  08/09/2019 FINDINGS: Cardiac shadow is enlarged but stable. LVAD is noted in place. Endotracheal tube, gastric catheter and right-sided PICC line are noted and stable. Diffuse airspace opacities are again identified throughout both lungs slightly greater on the right than the left stable in appearance from the prior exam. No new focal infiltrate or  effusion is noted. Linear density is noted over the left base new from the prior study likely related to extrinsic artifact. IMPRESSION: Stable appearance of the chest when compare with the previous day. No new focal abnormality is noted. Electronically Signed   By: Inez Catalina M.D.   On: 08/11/2019 08:05    Impression: Suspected Upper GI Bleeding in the setting of Heparin and aspirin use -Melena and red blood in patient's mouth and OG tube today -Hgb 8.7 today, progressively decreasing over the past week.  Yesterday, Hgb 9.2.   AKI: BUN/Cr down-trending. Currently BUN 64/ Cr 1.48  Refractory V fib, acute systolic HF, acute hypoxic respiratory failure, CAD/STEMI  Plan: Bedside EGD tomorrow.  I called patient's sister, Erick Colace, and discussed the procedure thoroughly to include nature, alternatives, benefits, and risks (including but not limited  to bleeding, infection, perforation, cardiac/pulmonary complications).  Patient sister [name] verbalized understanding and gave verbal consent to proceed with EGD. Consent confirmed by RN.  OK per Dr. Haroldine Laws (cardiology) to hold heparin 4 hours prior to procedure.  EGD scheduled for 9:45am tomorrow, hold Heparin starting at 5:45am on 8/11.  Continue Protonix 40 mg IV twice daily.  Continue to monitor H&H with transfusion as needed to maintain hemoglobin greater than 8.  [facility] will follow.   LOS: 14 days   Salley Slaughter  PA-C 08/11/2019, 12:50 PM  Contact #  [phone]

## 2019-08-11 NOTE — Progress Notes (Signed)
LVAD Coordinator Rounding Note:  HM III LVAD implanted on 06/25/19 by Dr. Orvan Seen under Destination Therapy criteria due to recent smoking history.  Pt's transferred to CIR on 07/24/19. Had refractory VF 07/10/2019 and transferred back to [place].   Pt remains intubated, sedated. Has not had VT/VF since Friday. Started on Quinidine over the weekend.   Per bedside RN, patient passes blood clot from his mouth this morning. No further rectal clots noted since yesterday. Plan for bedside EGD tomorrow.   Vital signs: Temp: 98.1 HR: 63 Art. Line: 86/75 (81) Cuff BP: 89/72 (79) Doppler: 82   O2 Sat: 100% on 40% FiO2 5 peep rate 18 TV 560 Wt: 189.6>196.4>200.8>209>209.6>210.7>214.1>214.2>211.8>208>199.9>194.4  lbs  LVAD parameters: Speed:  5300 Flow: 4.7 Power:  3.8 w PI: 3.0 Hct: 29  Alarms: none Events: none  Fixed speed: 5300 Low speed limit: 5000    Drive Line:  Existing dressing clean, dry, intact. Anchor intact and accurately applied. Continue q 3 day dressing changes per bedside nurse. Next dressing change due: 08/18/2019. Discussed with bedside RN- she will change today.   Labs:  LDH trend: 326>250>256>358>297>309>266>243>235>234>235>238  INR trend: 2.1>3.5>2.2>1.5>1.5>1.3>1.3>1.3>1.2>1.2>  WBC trend: 14.0>11.1>11.4>12.0  Anticoagulation Plan: -INR Goal:  2.0 - 2.5 -ASA Dose: 81 mg - will keep going for CAD for 3 mths stop date 09/25/19  Device: N/A  IABP: - placed 07/04/19 - dc'd 08/06/19  Drips: - Amiodarone 30 mg/hr - Fentanyl 75 mcg/hr>> off - Heparin 1150 mg/hr - Propofol 25 mcg/kg/min - Neo 30 mcg/kg/min  - Tube feed 65 ml/hr   Infection: - 07/11/2019 - BCs>>negative - 07/09/2019 - urine culture>>negative - 07/30/19 - respiratory culture>>Klebsiella pneumoniae - 08/04/19 - BCs>> NGTD final -08/09/19- respiratory culture>> Klebsiella pneumoniae  Arrythmias:  - 07/27/2019 Vfib while in CIR; emergently transferred back to 2H>> DCCV back to NSR; Amio gtt - 07/07/2019 recurrent  VF>>increased amio gtt; [place] bolus/gtt; DC-CV 200J to NSR - 07/20/2019 recurrent VF>>8 separate DC-CV throughout night; required pressors for BP support and re-intubation - 07/15/2019 recurrent VT/VF>>7 separate DC-CV throughout am; IABP and pacer urgently placed in Cath Lab - 07/28/2019 recurrent VT/VF>>2 additional DC-CV after IABP placment - 08/01/19 recurrent VT/VF>> pt had 3 episodes of VT/VR with 4 DC-CV - 08/02/19 recurrent VT/VF>>one DC-CV to SR - 08/04/19 recurrent VT/VF>>5 DC-CV to SR - 08/05/19 recurrent VT/VF>> one DC-CV to SR - 08/06/19 recurrent VT/VF>> episode required 3 DC-CV to SR  Respiratory:  - intubated 07/29/19   Plan/Recommendations:  1. Call VAD pager if any VAD equipment or drive line issues. 2. Every three day dressing changes per bedside nurse.  Emerson Monte RN [place] Coordinator  Office: [phone]  24/7 Pager: [phone]

## 2019-08-11 NOTE — Progress Notes (Signed)
Nutrition Follow-up  DOCUMENTATION CODES:   Severe malnutrition in context of acute illness/injury  INTERVENTION:   Continue tube feeding:  Vital 1.5 at 65 ml/hr via OG Pro-Source TF 90 mL BID Provides 2500 kcals, 149 g of protein, 1186 mL of free water Meets 100% estimated calorie and protein needs  NUTRITION DIAGNOSIS:   Severe Malnutrition related to acute illness (CHF with HM3 implantation) as evidenced by moderate fat depletion, moderate muscle depletion, energy intake < or equal to 50% for > or equal to 5 days.  Ongoing  GOAL:   Patient will meet greater than or equal to 90% of their needs  Addressed via TF  MONITOR:   Diet advancement, Vent status, Skin, TF tolerance, Weight trends, Labs, I & O's  REASON FOR ASSESSMENT:   Ventilator    ASSESSMENT:   Patient with PMH significant for MI, CAD, HTN, ischemic cardiomyopathy, CHF Recently admitted with acute STEMI, required multiple DCCV, IABP, and subsequent VAD for cardiogenic shock (HM3 placed 6/24). Presents from rehab with VF and readmitted to ICU.  7/30 IABP placed 8/05 IABP removed, VT shock x3 8/06 extubated, VT shock x3, reintubated   Pt discussed during ICU rounds and with RN.   No further VT/VF since starting quinidine. Remains on low dose pressor. Weaning sedation to see if he tolerates without arrhythmias. Had some blood in esophagus this am, but this has resolved. Plan to proceed with EGD tomorrow. Okay to continue feeding today per GI.   Admission weight: 86 kg  Current weight: 88.2 kg   I/O: -1,159 ml since admit UOP: 3,695 ml x 24 hrs Stool: 700 ml x 24 hrs   Drips: neosynephrine, propofol (5.83 ml/hr) Medications: dulcolax, colace, folic acid, 40 mg lasix BID, SS novolog, miralax, 20 mEq KCL TID, thiamine Labs: Na 146 (H) CBG 119-163  Diet Order:   Diet Order            Diet NPO time specified  Diet effective now                 EDUCATION NEEDS:   Not appropriate for education  at this time  Skin:  Skin Assessment: Skin Integrity Issues: Skin Integrity Issues:: Incisions Incisions: chest, abdomen  Last BM:  8/9  Height:   Ht Readings from Last 1 Encounters:  08/01/19 5\' 9"  (1.753 m)    Weight:   Wt Readings from Last 1 Encounters:  08/11/19 88.2 kg    BMI:  Body mass index is 28.71 kg/m.  Estimated Nutritional Needs:   Kcal:  2150-2580 kcal  Protein:  130-172 grams  Fluid:  >/= 2.1 L/day  Mariana Single RD, LDN Clinical Nutrition Pager listed in [place]

## 2019-08-12 ENCOUNTER — Inpatient Hospital Stay (HOSPITAL_COMMUNITY): Payer: BC Managed Care – PPO

## 2019-08-12 ENCOUNTER — Encounter (HOSPITAL_COMMUNITY): Admission: AD | Disposition: E | Payer: Self-pay | Source: Ambulatory Visit | Attending: Internal Medicine

## 2019-08-12 HISTORY — PX: ESOPHAGOGASTRODUODENOSCOPY: SHX5428

## 2019-08-12 LAB — COMPREHENSIVE METABOLIC PANEL
ALT: 50 U/L — ABNORMAL HIGH (ref 0–44)
AST: 87 U/L — ABNORMAL HIGH (ref 15–41)
Albumin: 1.9 g/dL — ABNORMAL LOW (ref 3.5–5.0)
Alkaline Phosphatase: 101 U/L (ref 38–126)
Anion gap: 9 (ref 5–15)
BUN: 59 mg/dL — ABNORMAL HIGH (ref 6–20)
CO2: 27 mmol/L (ref 22–32)
Calcium: 8.2 mg/dL — ABNORMAL LOW (ref 8.9–10.3)
Chloride: 110 mmol/L (ref 98–111)
Creatinine, Ser: 1.34 mg/dL — ABNORMAL HIGH (ref 0.61–1.24)
GFR calc Af Amer: >60 mL/min (ref >60)
GFR calc non Af Amer: 59 mL/min — ABNORMAL LOW (ref >60)
Glucose, Bld: 192 mg/dL — ABNORMAL HIGH (ref 70–99)
Potassium: 4.4 mmol/L (ref 3.5–5.1)
Sodium: 146 mmol/L — ABNORMAL HIGH (ref 135–145)
Total Bilirubin: 0.3 mg/dL (ref 0.3–1.2)
Total Protein: 5.9 g/dL — ABNORMAL LOW (ref 6.5–8.1)

## 2019-08-12 LAB — CBC
HCT: 31.3 % — ABNORMAL LOW (ref 39.0–52.0)
Hemoglobin: 9 g/dL — ABNORMAL LOW (ref 13.0–17.0)
MCH: 27.7 pg (ref 26.0–34.0)
MCHC: 28.8 g/dL — ABNORMAL LOW (ref 30.0–36.0)
MCV: 96.3 fL (ref 80.0–100.0)
Platelets: 284 10*3/uL (ref 150–400)
RBC: 3.25 MIL/uL — ABNORMAL LOW (ref 4.22–5.81)
RDW: 19.6 % — ABNORMAL HIGH (ref 11.5–15.5)
WBC: 13.2 10*3/uL — ABNORMAL HIGH (ref 4.0–10.5)
nRBC: 0 % (ref 0.0–0.2)

## 2019-08-12 LAB — GLUCOSE, CAPILLARY
Glucose-Capillary: 104 mg/dL — ABNORMAL HIGH (ref 70–99)
Glucose-Capillary: 115 mg/dL — ABNORMAL HIGH (ref 70–99)
Glucose-Capillary: 126 mg/dL — ABNORMAL HIGH (ref 70–99)
Glucose-Capillary: 132 mg/dL — ABNORMAL HIGH (ref 70–99)
Glucose-Capillary: 135 mg/dL — ABNORMAL HIGH (ref 70–99)
Glucose-Capillary: 149 mg/dL — ABNORMAL HIGH (ref 70–99)

## 2019-08-12 LAB — COOXEMETRY PANEL
Carboxyhemoglobin: 1.5 % (ref 0.5–1.5)
Methemoglobin: 0.7 % (ref 0.0–1.5)
O2 Saturation: 71.8 %
Total hemoglobin: 9.7 g/dL — ABNORMAL LOW (ref 12.0–16.0)

## 2019-08-12 LAB — PROTIME-INR
INR: 1.2 (ref 0.8–1.2)
Prothrombin Time: 14.9 seconds (ref 11.4–15.2)

## 2019-08-12 LAB — HEPARIN LEVEL (UNFRACTIONATED): Heparin Unfractionated: 0.29 IU/mL — ABNORMAL LOW (ref 0.30–0.70)

## 2019-08-12 LAB — MAGNESIUM: Magnesium: 2.2 mg/dL (ref 1.7–2.4)

## 2019-08-12 LAB — LACTATE DEHYDROGENASE: LDH: 247 U/L — ABNORMAL HIGH (ref 98–192)

## 2019-08-12 SURGERY — EGD (ESOPHAGOGASTRODUODENOSCOPY)
Anesthesia: Moderate Sedation

## 2019-08-12 MED ORDER — MIDAZOLAM HCL 2 MG/2ML IJ SOLN
2.0000 mg | Freq: Once | INTRAMUSCULAR | Status: AC
  Administered 2019-08-15: 2 mg via INTRAVENOUS
  Filled 2019-08-12: qty 2

## 2019-08-12 MED ORDER — MIDAZOLAM HCL 2 MG/2ML IJ SOLN
INTRAMUSCULAR | Status: AC
  Filled 2019-08-12: qty 2

## 2019-08-12 MED ORDER — FENTANYL CITRATE (PF) 100 MCG/2ML IJ SOLN
100.0000 ug | Freq: Once | INTRAMUSCULAR | Status: AC
  Administered 2019-08-14: 100 ug via INTRAVENOUS
  Filled 2019-08-12: qty 2

## 2019-08-12 MED ORDER — FENTANYL CITRATE (PF) 100 MCG/2ML IJ SOLN
INTRAMUSCULAR | Status: AC
  Filled 2019-08-12: qty 2

## 2019-08-12 MED ORDER — HEPARIN (PORCINE) 25000 UT/250ML-% IV SOLN
1150.0000 [IU]/h | INTRAVENOUS | Status: AC
  Administered 2019-08-12 – 2019-08-18 (×6): 1150 [IU]/h via INTRAVENOUS
  Filled 2019-08-12 (×6): qty 250

## 2019-08-12 MED ORDER — SODIUM CHLORIDE 0.9 % IV SOLN
2.0000 g | INTRAVENOUS | Status: DC
  Administered 2019-08-12: 2 g via INTRAVENOUS
  Filled 2019-08-12 (×2): qty 20

## 2019-08-12 MED ORDER — DEXMEDETOMIDINE HCL IN NACL 400 MCG/100ML IV SOLN
0.4000 ug/kg/h | INTRAVENOUS | Status: DC
  Administered 2019-08-12 – 2019-08-13 (×2): 0.4 ug/kg/h via INTRAVENOUS
  Administered 2019-08-13: 0.7 ug/kg/h via INTRAVENOUS
  Administered 2019-08-14: 0.6 ug/kg/h via INTRAVENOUS
  Administered 2019-08-15: 0.5 ug/kg/h via INTRAVENOUS
  Administered 2019-08-15 – 2019-08-16 (×2): 0.4 ug/kg/h via INTRAVENOUS
  Filled 2019-08-12 (×8): qty 100

## 2019-08-12 NOTE — Procedures (Signed)
Bronchoscopy Procedure Note  Aaron [name]  [id number]  July 05, 1965  Date:08/15/2019  Time:11:37 AM   Provider Performing:Bakari Nikolai V. Akayla Brass   Procedure(s):  Flexible bronchoscopy with bronchial alveolar lavage (40981)  Indication(s) hemoptysis  Consent Unable to obtain consent due to emergent nature of procedure.  Anesthesia Propofol gtt   Time Out Verified patient identification, verified procedure, site/side was marked, verified correct patient position, special equipment/implants available, medications/allergies/relevant history reviewed, required imaging and test results available.   Sterile Technique Usual hand hygiene, masks, gowns, and gloves were used   Procedure Description Bronchoscope advanced through endotracheal tube and into airway.  Airways were examined down to subsegmental level with findings noted below.   Following diagnostic evaluation, BAL(s) performed in 60cc  with normal saline and return of 20cc fluid  Findings: endobronchial mass ? Mucus + clot lodged in RML bronchus, blood around it was suctioned, no active bleeding areas noted. Mass was 'stringy' but could not be suctioned out entirely, small chunks were removed with multiple flushes, sent for cytology , no brush available to obtain   Complications/Tolerance None; patient tolerated the procedure well. Chest X-ray is not needed post procedure.   EBL Minimal   Specimen(s) BAL for culture & cytology  Roxan Yamamoto V. Elsworth Soho MD

## 2019-08-12 NOTE — Procedures (Signed)
Intubation Procedure Note  Aaron [name]  [id number]  1965-03-17  Date:08/30/2019  Time:11:35 AM   Provider Performing:Petra Dumler V. Mykenna Viele    Procedure: Intubation (42103)  Indication(s) Respiratory Failure  Consent Unable to obtain consent due to emergent nature of procedure.   Anesthesia Propofol   Time Out Verified patient identification, verified procedure, site/side was marked, verified correct patient position, special equipment/implants available, medications/allergies/relevant history reviewed, required imaging and test results available.   Sterile Technique Usual hand hygeine, masks, and gloves were used   Procedure Description Patient positioned in bed supine.  Sedation given  With propofol gtt. Tube exchanger used - 7.5 ETT placed over exchanger.  Number of attempts was 1.  Colorimetric CO2 detector was consistent with tracheal placement.   Complications/Tolerance None; patient tolerated the procedure well. Chest X-ray is ordered to verify placement.   EBL Minimal   Specimen(s) None   Madeline Pho V. Elsworth Soho MD

## 2019-08-12 NOTE — Brief Op Note (Signed)
07/24/2019 - 08/22/2019  11:03 AM  PATIENT:  Aaron [name]  54 y.o. male  PRE-OPERATIVE DIAGNOSIS:  upper GI bleeding  POST-OPERATIVE DIAGNOSIS: Gastritis , no active bleeding  PROCEDURE:  Procedure(s): ESOPHAGOGASTRODUODENOSCOPY (EGD) (N/A)  SURGEON:  Surgeon(s) and Role:    * Quy Lotts, MD - Primary  Findings ----------- -EGD showed gastritis and retained fluid in the stomach.  No evidence of active bleeding.  Images were not saved because of technical issues.  Recommendations ------------------------- -Okay to resume heparin drip -Okay to resume tube feeding -Continue Protonix 40 mg twice daily because of gastritis  while patient remains intubated.  Switch to Protonix 40 mg once a day after extubation -Monitor H&H -No further inpatient GI work-up planned.  GI will sign off.  Call us back if needed  Otis Brace MD, [place] 08/03/2019, 11:04 AM  Contact #  [phone]

## 2019-08-12 NOTE — Progress Notes (Signed)
RT NOTE: RT assisted MD with intubation using tube exchanger. ETCO2 was positive for color change and BBS equal. Tube secured 24@lip .

## 2019-08-12 NOTE — Progress Notes (Addendum)
[facility] for Heparin Indication: LVAD  Allergies  Allergen Reactions  . Codeine Hives    Patient Measurements: Height: 5\' 9"  (175.3 cm) Weight: 88 kg (194 lb 0.1 oz) IBW/kg (Calculated) : 70.7 Heparin Dosing Weight: ~ 92 kg  Vital Signs: Temp: 98.1 F (36.7 C) (08/11 0328) Temp Source: Oral (08/11 0328) BP: 105/75 (08/11 0800) Pulse Rate: 71 (08/11 0800)  Labs: Recent Labs    08/10/19 0336 08/10/19 0336 08/10/19 0910 08/10/19 0910 08/11/19 0359 08/24/2019 0321  HGB 9.1*   < > 9.2*   < > 8.7* 9.0*  HCT 31.3*   < > 31.4*  --  30.2* 31.3*  PLT 246   < > 258  --  256 284  LABPROT  --   --   --   --   --  14.9  INR  --   --   --   --   --  1.2  HEPARINUNFRC 0.25*  --   --   --  0.29* 0.29*  CREATININE 1.53*  --   --   --  1.48* 1.34*   < > = values in this interval not displayed.    Estimated Creatinine Clearance: 68.4 mL/min (A) (by C-G formula based on SCr of 1.34 mg/dL (H)).  Assessment: 54 yo male s/p LVAD HM3 placement 6/24, followed by IABP 7/8-7/13. 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 IABP removed 8/5, heparin restarted for LVAD. Extubated and reintubated 8/6.   Heparin level is therapeutic at 0.29, on 1150 units/hr - stopped this morning. Hgb 9, plt 284. LDH 247. No infusion issues - no bleeding this morning - however frank blood noted by trach site. Scope completed today finding gastritis without active bleeding. Discussed with teams - hold for another hour to monitor for any additional bleeding.   Goal of Therapy:  Heparin level goal 0.2-0.5 w/ LVAD Monitor platelets by anticoagulation protocol: Yes   Plan:  Restart heparin infusion at previously therapeutic rate of 1150 on 8/11@1230  F/u ability to tolerate po anticoagulation eventually.  Antonietta Jewel, PharmD, [place] Clinical Pharmacist  Phone: [phone] 08/24/2019 9:12 AM  Please check AMION  for all [place] phone numbers After 10:00 PM, call [place] [phone]

## 2019-08-12 NOTE — Interval H&P Note (Signed)
History and Physical Interval Note:  08/10/2019 8:38 AM  Aaron [name]  has presented today for surgery, with the diagnosis of upper GI bleeding.  The various methods of treatment have been discussed with the patient and family. After consideration of risks, benefits and other options for treatment, the patient has consented to  Procedure(s): ESOPHAGOGASTRODUODENOSCOPY (EGD) (N/A) as a surgical intervention.  The patient's history has been reviewed, patient examined, no change in status, stable for surgery.  I have reviewed the patient's chart and labs.  Questions were answered to the patient's satisfaction.    Patient seen and examined at bedside.  Chart reviewed briefly.  Discussed with RN at bedside.  No acute events overnight.  No evidence of melanotic stools.  Hemoglobin stable.  Heparin drip on hold since this morning.  Proceed with EGD as scheduled.   [name]

## 2019-08-12 NOTE — Plan of Care (Signed)
  Problem: Education: Goal: Ability to demonstrate management of disease process will improve Outcome: Not Progressing Goal: Ability to verbalize understanding of medication therapies will improve Outcome: Not Progressing Goal: Individualized Educational Video(s) Outcome: Not Progressing   Problem: Activity: Goal: Capacity to carry out activities will improve Outcome: Not Progressing   Problem: Education: Goal: Knowledge of General Education information will improve Description: Including pain rating scale, medication(s)/side effects and non-pharmacologic comfort measures Outcome: Not Progressing

## 2019-08-12 NOTE — Op Note (Signed)
[facility] Patient Name: Aaron [name] Procedure Date : 08/07/2019 MRN: [id number] Attending MD: Otis Brace , MD Date of Birth: [birth year] CSN: [id number] Age: 54 Admit Type: Inpatient Procedure:                Upper GI endoscopy Indications:              Suspected upper gastrointestinal bleeding Providers:                Otis Brace, MD Referring MD:              Medicines:                None Complications:            No immediate complications. Estimated Blood Loss:     Estimated blood loss: none. Procedure:                Pre-Anesthesia Assessment:                           - Prior to the procedure, a History and Physical                            was performed, and patient medications and                            allergies were reviewed. The patient's tolerance of                            previous anesthesia was also reviewed. The risks                            and benefits of the procedure and the sedation                            options and risks were discussed with the patient.                            All questions were answered, and informed consent                            was obtained. Prior Anticoagulants: The patient has                            taken heparin, last dose was day of procedure. ASA                            Grade Assessment: III - A patient with severe                            systemic disease. After reviewing the risks and                            benefits, the patient was deemed in satisfactory  condition to undergo the procedure.                           After obtaining informed consent, the endoscope was                            passed under direct vision. Throughout the                            procedure, the patient's blood pressure, pulse, and                            oxygen saturations were monitored continuously. The                            GIF-H190 ([id number])  Olympus gastroscope was                            introduced through the mouth, and advanced to the                            second part of duodenum. The upper GI endoscopy was                            technically difficult and complex due to the                            patient's body habitus. The patient tolerated the                            procedure well. Images were not saved because of                            technical issues. Findings:      There is no endoscopic evidence of bleeding in the entire esophagus.      Diffuse mild inflammation characterized by congestion (edema) and       erythema was found in the entire examined stomach.      The cardia and gastric fundus were normal on retroflexion.      Retained fluid was found in the gastric body.      There is no endoscopic evidence of bleeding in the entire examined       stomach.      The duodenal bulb, first portion of the duodenum and second portion of       the duodenum were normal.      -Images were not saved because of technical issues. Impression:               - Gastritis.                           - Retained gastric fluid.                           - Normal duodenal bulb, first portion of the  duodenum and second portion of the duodenum.                           - No specimens collected.                           - Images were not saved because of technical issues. Recommendation:           - Return patient to hospital ward for ongoing care.                           - Resume previous diet.                           - Continue present medications. Procedure Code(s):        --- Professional ---                           [phone], Esophagogastroduodenoscopy, flexible,                            transoral; diagnostic, including collection of                            specimen(s) by brushing or washing, when performed                            (separate procedure) Diagnosis Code(s):         --- Professional ---                           K29.70, Gastritis, unspecified, without bleeding CPT copyright 2019 [employer]. All rights reserved. The codes documented in this report are preliminary and upon coder review may  be revised to meet current compliance requirements. Otis Brace, MD Otis Brace, MD 08/08/2019 11:03:30 AM Number of Addenda: 0

## 2019-08-12 NOTE — Progress Notes (Addendum)
Electrophysiology Rounding Note  Patient Name: Aaron [name] Date of Encounter: 08/29/2019  Primary Cardiologist: Dr. Haroldine Laws  Electrophysiologist: Dr. Rayann Heman   Subjective   Remains intubated sedated.   Rhythm stable. No further VT/VF on quinidine + amio.    Inpatient Medications    Scheduled Meds: . alteplase  2 mg Intracatheter Once  . aspirin  81 mg Per Tube Daily  . atorvastatin  80 mg Per Tube Daily  . bisacodyl  10 mg Rectal Once  . chlorhexidine gluconate (MEDLINE KIT)  15 mL Mouth Rinse BID  . Chlorhexidine Gluconate Cloth  6 each Topical Daily  . clonazePAM  1 mg Per Tube BID  . docusate  100 mg Per Tube BID  . feeding supplement (PROSource TF)  90 mL Per Tube BID  . folic acid  1 mg Per Tube Daily  . free water  20 mL Per Tube Q4H  . furosemide  40 mg Intravenous BID  . gabapentin  300 mg Per Tube Q8H  . insulin aspart  0-15 Units Subcutaneous Q4H  . insulin aspart  3 Units Subcutaneous Q4H  . mouth rinse  15 mL Mouth Rinse 10 times per day  . midodrine  15 mg Per Tube TID WC  . multivitamin  15 mL Per Tube Daily  . pantoprazole (PROTONIX) IV  40 mg Intravenous Q12H  . polyethylene glycol  17 g Oral Daily  . potassium chloride  40 mEq Per Tube TID  . quiNIDine sulfate  150 mg Per Tube Q6H  . sildenafil  20 mg Per Tube TID  . sodium chloride flush  10-40 mL Intracatheter Q12H  . thiamine  100 mg Per Tube Daily   Continuous Infusions: . sodium chloride    . sodium chloride Stopped (08/05/19 1236)  . sodium chloride 20 mL/hr at 08/01/19 1700  . sodium chloride    . amiodarone 30 mg/hr (08/09/2019 0700)  . feeding supplement (VITAL 1.5 CAL) 1,000 mL (08/11/19 1712)  . fentaNYL infusion INTRAVENOUS Stopped (08/10/19 1256)  . phenylephrine (NEO-SYNEPHRINE) Adult infusion 10 mcg/min (08/03/2019 0700)  . piperacillin-tazobactam (ZOSYN)  IV 12.5 mL/hr at 08/24/2019 0700  . propofol (DIPRIVAN) infusion 10 mcg/kg/min (08/18/2019 0700)   PRN Meds: Place/Maintain  arterial line **AND** sodium chloride, sodium chloride, acetaminophen, fentaNYL, midazolam, sodium chloride flush   Vital Signs    Vitals:   08/15/2019 0645 08/27/2019 0700 08/22/2019 0715 08/07/2019 0729  BP:  (!) 88/76    Pulse: (!) 138 (!) 139 (!) 142 70  Resp: (!) 24 (!) 26 (!) 22 (!) 30  Temp:      TempSrc:      SpO2: 95% 93% 93% 98%  Weight:      Height:        Intake/Output Summary (Last 24 hours) at 08/30/2019 0749 Last data filed at 08/17/2019 0747 Gross per 24 hour  Intake 2893.9 ml  Output 4195 ml  Net -1301.1 ml   Filed Weights   08/11/19 0000 08/11/19 0500 08/21/2019 0426  Weight: 88.2 kg 88.2 kg 88 kg    Physical Exam    GEN- The patient is chronically ill appearing, intubated and sedated.  Head- normocephalic, atraumatic. ET tube in place Neck- supple Lungs-+ Mechanical breathing sounds on vent.  Heart- + LVAD hum.  GI- soft Extremities- no clubbing or cyanosis. + dependent edema into thighs.  Neuro- intubated/sedated.  Labs    CBC Recent Labs    08/11/19 0359 08/30/2019 0321  WBC 12.0* 13.2*  HGB  8.7* 9.0*  HCT 30.2* 31.3*  MCV 96.2 96.3  PLT 256 276   Basic Metabolic Panel Recent Labs    08/11/19 0359 08/18/2019 0321  NA 146* 146*  K 3.9 4.4  CL 106 110  CO2 28 27  GLUCOSE 178* 192*  BUN 64* 59*  CREATININE 1.48* 1.34*  CALCIUM 8.2* 8.2*  MG 1.9 2.2   Liver Function Tests Recent Labs    08/11/19 0359 08/06/2019 0321  AST 78* 87*  ALT 46* 50*  ALKPHOS 105 101  BILITOT 0.8 0.3  PROT 5.8* 5.9*  ALBUMIN 1.9* 1.9*   No results for input(s): LIPASE, AMYLASE in the last 72 hours. Cardiac Enzymes No results for input(s): CKTOTAL, CKMB, CKMBINDEX, TROPONINI in the last 72 hours.   Telemetry    SB/NSR 50-60s (personally reviewed)  Radiology    DG Chest Port 1 View  Result Date: 08/11/2019 CLINICAL DATA:  Follow-up endotracheal tube. EXAM: PORTABLE CHEST 1 VIEW COMPARISON:  08/09/2019 FINDINGS: Cardiac shadow is enlarged but stable. LVAD  is noted in place. Endotracheal tube, gastric catheter and right-sided PICC line are noted and stable. Diffuse airspace opacities are again identified throughout both lungs slightly greater on the right than the left stable in appearance from the prior exam. No new focal infiltrate or effusion is noted. Linear density is noted over the left base new from the prior study likely related to extrinsic artifact. IMPRESSION: Stable appearance of the chest when compare with the previous day. No new focal abnormality is noted. Electronically Signed   By: Inez Catalina M.D.   On: 08/11/2019 08:05    Patient Profile     Aaron Debruyne Noguesis a 54 y.o.malewith a past medical history significant for CAD and recent LVAD placement. Post op course complicated by incessant VT. Quieted and transferred to CIR where he had recurrent VF. Re-admitted to ICU.  - Had refractory VF throughout the night 7/28 w/ ~10 shocks. Now intubated.  - Developed bradycardia and further VF 7/30 am, TTVP + IABP placed 7/30 am.  - 8/1 completed cefepime, switched to ancef. Had VF 08/02/19 shock x1  - 8/3 Echo ramp, increased speed to 5400. VF x3. Failed IABP wean. Speed decreased to 5300.  - 08/05/19 VF shock x1 - 8/5 IABP removed. VT shock x 3 when sedation weaned and patient agitated.  - 8/6 Extubated, VT with shock x 3, reintubated.  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  Rhythm currently stable on quinidine and amiodarone.  BP does not allow titration of BB at this time.   EKG today with auto calculate with QTc of 612. Manual measurement shows QT ~ 440 and QTc ~ 470 -> even less when corrected for QRS.    PR interval relatively stable at ~270.   Auto-calculate is significantly confounded by noise from LVAD.   For questions or updates, please contact [place] Please consult [email or web address] for contact info under Cardiology/STEMI.  Signed, Shirley Friar, PA-C    08/15/2019, 7:49 AM   I have reviewed the above assessment and plan and discussed with EP PA.  PR and QT are stable.  No further VT since 08/07/19.  Hopefully we rhythm will remain stable and we can try to wake him up soon.  EP to continue to follow.  Co Sign: Thompson Grayer, MD 08/14/2019 11:42 AM

## 2019-08-12 NOTE — Progress Notes (Addendum)
Patient ID: Aaron [name], male   DOB: November 12, 1965, 54 y.o.   MRN: [id number]   Advanced Heart Failure VAD Team Note  PCP-Cardiologist: No primary care provider on file.   Subjective:    - Had refractory VF throughout the night 7/28 w/ ~10 shocks. Now intubated.  - Developed bradycardia and further VF 7/30 am, TTVP + IABP placed 7/30 am.   - 8/1 completed cefepime, switched to ancef. Had VF 08/02/19 shock x1  - 8/3 Echo ramp, increased speed to 5400. VF x3. Failed IABP wean. Speed decreased to 5300.   - 08/05/19 VF shock x1 - 8/5 IABP removed.  VT shock x 3 when sedation weaned and patient agitated.  - 8/6 Extubated, VT with shock x 3, reintubated.  -8/8 Lidocaine stopped. Quinidine started   No events overnight.   Rhythm stable. No further VT/VF since starting quinidine. NSR HR 70s.  K 4.0 Mg 2.2   Co-ox 72%. On 10 mcg of NEO. MAPs in the 80s.   -3.6L in UOP w/ IV Lasix yesterday. SCr stable, 1.34. CVP 9-10  Hgb stable at 9.0. On for bedside endoscopy today by GI given concerns for GIB.   mTemp past 24 hrs 101.1 WBC trending up slowly, 10>>11>>12>>13. PCT 0.21. On Zosyn for UTI. Tracheal aspiratate also growing Klebsiella Pneumoniae.      LVAD INTERROGATION:  HeartMate 3 LVAD:   Flow 4.5 liters/min, speed 5300, power 3.8, PI 2.8.  No PI events   Objective:    Vital Signs:   Temp:  [98.1 F (36.7 C)-101.1 F (38.4 C)] 98.1 F (36.7 C) (08/11 0328) Pulse Rate:  [27-139] 139 (08/11 0700) Resp:  [15-34] 26 (08/11 0700) BP: (75-109)/(61-93) 88/76 (08/11 0700) SpO2:  [89 %-100 %] 93 % (08/11 0700) Arterial Line BP: (77-105)/(69-96) 84/74 (08/11 0700) FiO2 (%):  [40 %] 40 % (08/11 0400) Weight:  [88 kg] 88 kg (08/11 0426) Last BM Date: 08/11/19 Mean arterial Pressure 70s   Intake/Output:   Intake/Output Summary (Last 24 hours) at 08/25/2019 0713 Last data filed at 08/05/2019 0700 Gross per 24 hour  Intake 2893.9 ml  Output 4120 ml  Net -1226.1 ml     Physical Exam    CVP 9-10 General:  Intubated and sedated HEENT: + ETTNormal. Neck: Supple, JVP ~8 cm. Carotids OK.  Cardiac:  Mechanical heart sounds with LVAD hum present.  Lungs:  Intubated. CTAB.  Abdomen:  Soft, no HSM. No bruits or masses. Active bowel sounds  LVAD exit site: Well-healed and incorporated. Dressing dry and intact. No erythema or drainage. Stabilization device present and accurately applied. Driveline dressing changed daily per sterile technique. Extremities:  Warm and dry. No cyanosis, clubbing, rash.  No edema  Neuro:  Intubated and sedated  GU: + Foley   Telemetry   NSR HR 70s. No VT/VF in the last 48 hrs (personally reviewed)  Labs   Basic Metabolic Panel: Recent Labs  Lab 08/05/19 1644 08/05/19 1644 08/05/19 1749 08/06/19 0456 08/07/19 1546 08/07/19 1946 08/08/19 0310 08/08/19 0310 08/09/19 0340 08/09/19 0340 08/10/19 0336 08/11/19 0359 09/01/2019 0321  NA 135   < > 136   < > 135   < > 136  --  138  --  143 146* 146*  K 5.2*   < > 4.7   < > 4.3   < > 3.9  --  4.1  --  4.2 3.9 4.4  CL 101   < > 101   < > 99   < >  100  --  99  --  105 106 110  CO2 24   < > 23   < > 22   < > 25  --  26  --  '29 28 27  ' GLUCOSE 151*   < > 133*   < > 169*   < > 108*  --  220*  --  137* 178* 192*  BUN 88*   < > 84*   < > 103*   < > 99*  --  85*  --  76* 64* 59*  CREATININE 1.91*   < > 1.94*   < > 2.10*   < > 1.95*  --  1.61*  --  1.53* 1.48* 1.34*  CALCIUM 8.1*   < > 8.2*   < > 8.7*   < > 8.7*   < > 8.5*   < > 8.5* 8.2* 8.2*  MG  --   --   --    < > 2.0  --  2.3  --  2.0  --  1.9 1.9 2.2  PHOS 4.2  --  4.1  --  4.7*  --   --   --   --   --   --   --   --    < > = values in this interval not displayed.    Liver Function Tests: Recent Labs  Lab 08/07/19 1546 08/10/19 0336 08/11/19 0359 08/05/2019 0321  AST 87* 87* 78* 87*  ALT 25 47* 46* 50*  ALKPHOS 157* 120 105 101  BILITOT 0.6 0.7 0.8 0.3  PROT 6.4* 6.0* 5.8* 5.9*  ALBUMIN 2.2* 2.0* 1.9* 1.9*   No results for input(s):  LIPASE, AMYLASE in the last 168 hours. No results for input(s): AMMONIA in the last 168 hours.  CBC: Recent Labs  Lab 08/07/19 1546 08/08/19 0310 08/09/19 0340 08/10/19 0336 08/10/19 0910 08/11/19 0359 08/30/2019 0321  WBC 15.5*   < > 11.1* 10.8* 11.4* 12.0* 13.2*  NEUTROABS 13.2*  --   --   --   --   --   --   HGB 9.7*   < > 9.1* 9.1* 9.2* 8.7* 9.0*  HCT 31.6*   < > 29.9* 31.3* 31.4* 30.2* 31.3*  MCV 90.5   < > 93.4 95.7 94.0 96.2 96.3  PLT 277   < > 237 246 258 256 284   < > = values in this interval not displayed.    INR: Recent Labs  Lab 08/06/19 0456 08/07/19 0528 08/08/19 0310 08/25/2019 0321  INR 1.2 1.2 1.3* 1.2    Other results:    Imaging   DG Chest Port 1 View  Result Date: 08/11/2019 CLINICAL DATA:  Follow-up endotracheal tube. EXAM: PORTABLE CHEST 1 VIEW COMPARISON:  08/09/2019 FINDINGS: Cardiac shadow is enlarged but stable. LVAD is noted in place. Endotracheal tube, gastric catheter and right-sided PICC line are noted and stable. Diffuse airspace opacities are again identified throughout both lungs slightly greater on the right than the left stable in appearance from the prior exam. No new focal infiltrate or effusion is noted. Linear density is noted over the left base new from the prior study likely related to extrinsic artifact. IMPRESSION: Stable appearance of the chest when compare with the previous day. No new focal abnormality is noted. Electronically Signed   By: Inez Catalina M.D.   On: 08/11/2019 08:05     Medications:     Scheduled Medications: . alteplase  2 mg Intracatheter Once  .  aspirin  81 mg Per Tube Daily  . atorvastatin  80 mg Per Tube Daily  . bisacodyl  10 mg Rectal Once  . chlorhexidine gluconate (MEDLINE KIT)  15 mL Mouth Rinse BID  . Chlorhexidine Gluconate Cloth  6 each Topical Daily  . clonazePAM  1 mg Per Tube BID  . docusate  100 mg Per Tube BID  . feeding supplement (PROSource TF)  90 mL Per Tube BID  . folic acid  1 mg  Per Tube Daily  . free water  20 mL Per Tube Q4H  . furosemide  40 mg Intravenous BID  . gabapentin  300 mg Per Tube Q8H  . insulin aspart  0-15 Units Subcutaneous Q4H  . insulin aspart  3 Units Subcutaneous Q4H  . mouth rinse  15 mL Mouth Rinse 10 times per day  . midodrine  15 mg Per Tube TID WC  . multivitamin  15 mL Per Tube Daily  . pantoprazole (PROTONIX) IV  40 mg Intravenous Q12H  . polyethylene glycol  17 g Oral Daily  . potassium chloride  40 mEq Per Tube TID  . quiNIDine sulfate  150 mg Per Tube Q6H  . sildenafil  20 mg Per Tube TID  . sodium chloride flush  10-40 mL Intracatheter Q12H  . thiamine  100 mg Per Tube Daily    Infusions: . sodium chloride    . sodium chloride Stopped (08/05/19 1236)  . sodium chloride 20 mL/hr at 08/01/19 1700  . sodium chloride    . amiodarone 30 mg/hr (08/08/2019 0700)  . feeding supplement (VITAL 1.5 CAL) 1,000 mL (08/11/19 1712)  . fentaNYL infusion INTRAVENOUS Stopped (08/10/19 1256)  . phenylephrine (NEO-SYNEPHRINE) Adult infusion 10 mcg/min (08/18/2019 0700)  . piperacillin-tazobactam (ZOSYN)  IV 12.5 mL/hr at 08/17/2019 0700  . propofol (DIPRIVAN) infusion 10 mcg/kg/min (08/15/2019 0700)    PRN Medications: Place/Maintain arterial line **AND** sodium chloride, sodium chloride, acetaminophen, fentaNYL, midazolam, sodium chloride flush   Assessment/Plan:    1. Refractory Ventricular Fibrillation  -multiple episodes ~10 of VF on 7/28 - multiple episodes 7/30 am => IABP placed as well as TTVP given relative bradycardia.  IABP and TTVP now out.  Continued to have VF 7/30 , 7/31, 8/1, 8/3, 8/4, 8/6.    - Ranexa and Mexiletine currently on hold (unable to crush and administer through tube).  - Sotalol not a good option given his history of bradycardia.  - Lidocaine discontinued 8/8 - now on quinidine 150 mg q6h. No further VTVF  - Continue amiodarone gtt at 30 mg/hr.   - Keep K > 4.0 and Mg > 2.0. - Unable to tolerate extubation on 8/6  due to VT => occurs when he wakes up, presumably with stress.  Stable currently as intubated and sedated.  - Will follow ECG closely w/ quinidine.  If he continues to tolerate, will try weaning vent and extubating on quinidine.   - Appreciate EP's assistance   2. Acute Systolic Heart Failure s/p VAD - EF 69%, RV systolic function mildly reduced - HMIII LVAD placed 06/25/19.  - RV looks good on echo, cannula ok  - Co-ox 72% - avoid inotropes with VF, remains on midodrine 15 mg tid + phenylephrine 10 mcg/min.  - Off digoxin with bradycardia/AKI.  - Continue Heparin gtt, off warfarin currently. LDH stable at 247.   - Volume status improved w/ IV Lasix, CVP 9-10 today. Continue 40 IV BID - Creatinine lower today 1.34   3. Acute hypoxic  resp failure - intubated in setting of VF - vent management per PCCM.  When vent is weaned and he is awake, has VT/VF.  As above, will see if we can keep arrhythmias quiet on quinidine + amiodarone and try again to extubate.   4. CAD - S/p Acute STEMI 6/21.Emergent cath showed severe multivessel disease w/ 75% mid- distal LM, 100% prox LAD, not amendable to PCI (unable to cross w/ guide wire, probable CTO), 100% prox LCx (probable CTO) and 100% ostial RCA (probable CTO). There were mildto moderate collaterals right to left to the LAD, minimal right to right collateralsan minimalleft to left collaterals.LVEF 25% -Hs trop peaked 27,000. - No targets for CABG / PCI.  - S/P HMIII VAD - Continue ASA + statin   5. Renal mass = RCC - MRI abdomen- 1.8 cm right renal lesion, most consistent with renal cell carcinoma. No evidence of renal vein involvement or nodal metastasis. - Discussed w/ Urology and R RCC is small and will just require surveillance. Ok to proceed with VAD from their standpoint.   6. Urinary retention/UA - continue abx - foley replaced 8/10   7. ID:  - ?HCAP => Klebsiella in trach aspirate from 7/29.  - Completed cefepime 08/02/19,  switched to ancef (stopped 8/3) - now on Zosyn  - Blood CX repeated 08/04/19, NGTD - Tm 101.1 WBC trending up slowly, 10>>11>>12>>13. Treating for UTI.  - continue abx for UTI,  re-culture urine    8. Anemia w/ Concern for GI Source  - RN noted mild rectal bleeding 8/8 and blood clot from mouth 8/10 - hgb stable today, 9.2>>8.7>>9.0 - on anticoagulation for LVAD - Plan bedside EGD by GI today (IV heparin currently on hold)  - transfuse if hgb drops <7. 0   9. AKI - Improving  - Creatinine 1.87>>2.13>>1.99>>1.65>>1.53>>1.43>>1.34 - follow BMP w/ diuresis    Lyda Jester, PA-C 08/09/2019  7:13 AM   Agree with above.   Remains sedated on vent. No further VT/VF since starting quinidine. Co-ox looks good. Remains febrile to 101 despite broad spectrum abx. WBC trending up. Sputum cx with klebsiella. Zosyn sensitive. BCx negative. Diuresing well. Weight stable at 194 but still up from baseline. Plan for EGD today  General:  Intubated/sedated.  HEENT: normal  + ETT Neck: supple. JVP 9-10  Carotids 2+ bilat; no bruits. No lymphadenopathy or thryomegaly appreciated. Cor: LVAD hum.  Lungs: Clear. Abdomen: obese soft, nontender, non-distended. No hepatosplenomegaly. No bruits or masses. Good bowel sounds. Driveline site clean. Anchor in place.  Extremities: no cyanosis, clubbing, rash. Warm tr-1+ edema  Neuro: intubated sedated   VT/VF quiescent on quinidine. QT ok. Will plan EGD this am and then try to reduce sedation and wake him up on vent. If tolerates without recurrent VT/VF will plan extubation in next 24-48 hours. Continue IV lasix. Can increase to 80 IV bid as needed. VAD interrogated personally. Parameters stable. Restart warfarin after EGD. Watch fever curve.    CRITICAL CARE Performed by: Glori Bickers  Total critical care time: 35 minutes  Critical care time was exclusive of separately billable procedures and treating other patients.  Critical care was necessary to  treat or prevent imminent or life-threatening deterioration.  Critical care was time spent personally by me (independent of midlevel providers or residents) on the following activities: development of treatment plan with patient and/or surrogate as well as nursing, discussions with consultants, evaluation of patient's response to treatment, examination of patient, obtaining history from patient or  surrogate, ordering and performing treatments and interventions, ordering and review of laboratory studies, ordering and review of radiographic studies, pulse oximetry and re-evaluation of patient's condition.   Glori Bickers, MD  9:48 AM

## 2019-08-12 NOTE — Progress Notes (Signed)
CSW visited bedside and patient intubated. No family present at the time of visit. CSW available as needed. Raquel Sarna, [place], [place]

## 2019-08-12 NOTE — Progress Notes (Signed)
100 mcg of Fentanyl and 2mg  of Versed wasted with Bo Merino RN.

## 2019-08-12 NOTE — Progress Notes (Signed)
NAME:  Aaron [name], MRN:  [id number], DOB:  [birth year], LOS: 19 ADMISSION DATE:  07/31/2019, CONSULTATION DATE:  07/30/19 REFERRING MD:  Heart Failure, CHIEF COMPLAINT:  Respiratory failure -- VF, LVAD   Brief History   54 yo PMH acute systolic heart failure s/p LVAD (06/25/19), recurrent VT/VF/Torsades, multivessel CAD, R renal mass c/f RCC, suspected OSA, who presents back to ICU after prolonged hospital course after which he was transferred to [facility] 7/23. At CIR, pt developed dyspnea, hypoxia following PT 7/27. ECG obtained and reveals VF rate 210 bpm. Transferred to CVICU 7/27 for emergent DCCV. Intubated by anesthesia. DCCV performed numerous times 7/27, 7/28. Remains intubated requiring frequent cardioversion.  PCCM consulted 7/29 for vent management   Past Medical History  Acute systolic heart failure s/p LVAD CAD Recurrent VF, VT, torsades  Renal mass Debility Sleep apnea   Significant Hospital Events   6/16 admitted with acute anterolateral STEMI, cardiogenic shock requiring IABP 6/25 LVAD placement 7/03 VT/VT/Torsades requiring multiple emergent DCCV  7/06 recurrent VT/VT/Torsades requiring multiple emergent DCCV  7/07 recurrent VT/VT/Torsades requiring multiple emergent DCCV  7/08 PNA, respiratory failure, required intubation 7/13 IABP removed, extubated 7/23 discharred to CIR 7/27 recurrent VF, admit to CVICU for DCCV. Intubated 7/28 recurrent VF, DCCV multiple times 7/29 PCCM consult for vent  7/30 vt overnight defib x2 7/31 DCCV x2, improved on versed / no further shocks 7/31>> present, intermittent vtach/shocks correlating with agitation 8/6 failed extubation trial  Consults:  EP PCCM  Procedures:  ETT 7/27 >> Art line 7/28 >> RUE PICC 6/28 >>  R Femoral Venous Sheath 7/30 >> out IABP 7/30 >> out  Significant Diagnostic Tests:    Micro Data:  resp 7/29 >> klebsiella resp 8/8 >>>klebs >> R to ampi/ unasyn   Antimicrobials:  Cefepime 7/28-8/1 Ancef  8/1 -8/3 Zosyn 8/8 >> 8/11 ceftx 8/11 >>  Interim history/subjective:   Critically ill, intubated, sedated on propofol  He is on low-dose Neo-Synephrine Developed cuff leak yesterday Afebrile Good urine output   Objective   Blood pressure 105/75, pulse 71, temperature 98.1 F (36.7 C), temperature source Oral, resp. rate (!) 31, height 5\' 9"  (1.753 m), weight 88 kg, SpO2 99 %. CVP:  [8 mmHg-46 mmHg] 10 mmHg  Vent Mode: PSV;CPAP FiO2 (%):  [40 %] 40 % Set Rate:  [18 bmp] 18 bmp Vt Set:  [560 mL] 560 mL PEEP:  [5 cmH20] 5 cmH20 Pressure Support:  [12 cmH20] 12 cmH20 Plateau Pressure:  [20 cmH20-23 cmH20] 21 cmH20   Intake/Output Summary (Last 24 hours) at 08/16/2019 4128 Last data filed at 08/28/2019 0900 Gross per 24 hour  Intake 2824.88 ml  Output 4445 ml  Net -1620.12 ml   Filed Weights   08/11/19 0000 08/11/19 0500 08/11/2019 0426  Weight: 88.2 kg 88.2 kg 88 kg    Examination: GEN: no acute distress, sedated on vent HEENT: ETT in place, minimal secretions CV: LVAD hum, ext warm PULM: rhonci bilaterally, triggering vent GI: +BS, rectal tube in place EXT: muscle wasting, trace edema NEURO: Sedated, does not follow commands PSYCH: RASS -3 SKIN: No rashes  SvO2 72% Labs show decrease in BUN and creatinine, stable anemia, mild increase leukocytosis  Chest x-ray independently reviewed shows bilateral airspace disease stable  Resolved Hospital Problem list   Shock  Sepsis due to UTI, Klebsiella Pneumoniae PNA-cefazolin, complete 7 days total (8/3 end date)  Assessment & Plan:   Acute Hypoxic Respiratory Failure in setting of Recurrent VF/VT -PRVC 8cc/kg -VAP  prevention measures  -Tolerating W UA, but not spontaneous breathing trial, attempt again after EGD today -If loses tidal volumes due to cuff leak, may need change of ET tube  HAP -change from Zosyn to ceftriaxone to complete course of 7 days until 8/14  Sedation Needs for Mechanical Ventilation  -PAD  protocol with propofol gtt for RASS Goal -1 , intermittent fentanyl -Continue oral clonazepam  Recurrent VFib/Vtach requiring repeated DCCV -Cardiology / EP following  - on quinidine and amiodarone, EP following -Add beta-blocker once off neo- -optimize electrolytes, goal K>4, Mg>2  Acute systolic heart failure s/p LVAD placement (06/25/19) - diuresis as tolerated by renal function , currently 40 every 12 -off milrinone  UGI bleed/acute blood loss anemia -EGD planned today Coagulopathy, on warfarin PTA -heparin per pharmacy, goal level 0.2-0.4, back to warfarin once all invasive procedures are completed  At Risk Malnutrition in setting of Critical Illness -continue TF   Best practice:  Diet: TF  Pain/Anxiety/Delirium protocol (if indicated): Fentanyl + propofol gtt, PO klonipin VAP protocol (if indicated): yes DVT prophylaxis: heparin per pharmacy  GI prophylaxis: protonix  Glucose control: SSI Mobility: BR Code Status: Full  Family Communication: Per primary Disposition: ICU   The patient is critically ill with multiple organ systems failure and requires high complexity decision making for assessment and support, frequent evaluation and titration of therapies, application of advanced monitoring technologies and extensive interpretation of multiple databases. Critical Care Time devoted to patient care services described in this note independent of APP/resident  time is 32 minutes.      Kara Mead MD. [place] Flood. [facility]  If no response to pager , please call [phone]   08/25/2019

## 2019-08-12 NOTE — Progress Notes (Signed)
Patient had bedside EGD at 1100. All vitals and VAD numbers remained stable through procedure. Patient had a known cuff leak that started effecting his volumes. Therefore, following the EGD a tube exchange was done followed by a bronch due to excess blood in ETT tube.

## 2019-08-12 NOTE — Progress Notes (Signed)
LVAD Coordinator Rounding Note:  HM III LVAD implanted on 06/25/19 by Dr. Orvan Seen under Destination Therapy criteria due to recent smoking history.  Pt's transferred to CIR on 07/24/19. Had refractory VF 07/26/2019 and transferred back to [place].   Pt remains intubated, sedated. Has not had VT/VF since last week.   Plan for bedside EGD later this morning per GI. Plan for ETT exchange and bronch later this morning per CCM.   Vital signs: Temp: 98.1  HR: 63 Art. Line: 92/82 (87) Cuff BP:  Doppler: 78  O2 Sat: 100% on 40% FiO2 5 peep rate 18 TV 560 Wt: 189.6>196.4>200.8>209>209.6>210.7>214.1>214.2>211.8>208>199.9>194.4>194.0  lbs  LVAD parameters: Speed:  5300 Flow: 4.6 Power:  3.8 w PI: 2.9 Hct: 29  Alarms: none Events: none  Fixed speed: 5300 Low speed limit: 5000    Drive Line:  Existing dressing clean, dry, intact. Anchor intact and accurately applied. Existing VAD dressing removed and site care performed using sterile technique. Drive line exit site cleaned with sterile saline only, allowed to dry, and gauze dressing with NO SILVER STRIP re-applied. Exit site partially incorporated, the velour is fully implanted at exit site. Exit site remains red, no tenderness, scant drainage, no foul odor or rash noted. Drive line anchor re-applied. Pt denies fever or chills. Continue q 3 day dressing changes per bedside nurse. Next dressing change due: 08/15/19.       Labs:  LDH trend: 326>250>256>358>297>309>266>243>235>234>235>238>247  INR trend: 2.1>3.5>2.2>1.5>1.5>1.3>1.3>1.3>1.2>1.2>1.2  WBC trend: 14.0>11.1>11.4>12.0>13.2   Anticoagulation Plan: -INR Goal:  2.0 - 2.5 -ASA Dose: 81 mg - will keep going for CAD for 3 mths stop date 09/25/19  Device: N/A  IABP: - placed 07/04/19 - dc'd 08/06/19  Drips: - Amiodarone 30 mg/hr - Heparin 1150 mg/hr>> on hold for EGD - Propofol 20 mcg/kg/min - Neo 8 mcg/kg/min  - Tube feed 65 ml/hr>> on hold for EGD   Infection: - 07/23/2019 -  BCs>>negative - 08/01/2019 - urine culture>>negative - 07/30/19 - respiratory culture>>Klebsiella pneumoniae - 08/04/19 - BCs>> NGTD final -08/09/19- respiratory culture>> Klebsiella pneumoniae  Arrythmias:  - 07/27/2019 Vfib while in CIR; emergently transferred back to 2H>> DCCV back to NSR; Amio gtt - 07/20/2019 recurrent VF>>increased amio gtt; [place]ido bolus/gtt; DC-CV 200J to NSR - 07/02/2019 recurrent VF>>8 separate DC-CV throughout night; required pressors for BP support and re-intubation - 07/16/2019 recurrent VT/VF>>7 separate DC-CV throughout am; IABP and pacer urgently placed in Cath Lab - 07/12/2019 recurrent VT/VF>>2 additional DC-CV after IABP placment - 08/01/19 recurrent VT/VF>> pt had 3 episodes of VT/VR with 4 DC-CV - 08/02/19 recurrent VT/VF>>one DC-CV to SR - 08/04/19 recurrent VT/VF>>5 DC-CV to SR - 08/05/19 recurrent VT/VF>> one DC-CV to SR - 08/06/19 recurrent VT/VF>> episode required 3 DC-CV to SR  Respiratory:  - intubated 07/29/19   Plan/Recommendations:  1. Call VAD pager if any VAD equipment or drive line issues. 2. Every three day dressing changes per bedside nurse.  Emerson Monte RN [place] Coordinator  Office: [phone]  24/7 Pager: [phone]

## 2019-08-13 DIAGNOSIS — Y95 Nosocomial condition: Secondary | ICD-10-CM

## 2019-08-13 DIAGNOSIS — J189 Pneumonia, unspecified organism: Secondary | ICD-10-CM

## 2019-08-13 LAB — COMPREHENSIVE METABOLIC PANEL
ALT: 75 U/L — ABNORMAL HIGH (ref 0–44)
AST: 121 U/L — ABNORMAL HIGH (ref 15–41)
Albumin: 2.1 g/dL — ABNORMAL LOW (ref 3.5–5.0)
Alkaline Phosphatase: 92 U/L (ref 38–126)
Anion gap: 12 (ref 5–15)
BUN: 54 mg/dL — ABNORMAL HIGH (ref 6–20)
CO2: 25 mmol/L (ref 22–32)
Calcium: 8.5 mg/dL — ABNORMAL LOW (ref 8.9–10.3)
Chloride: 114 mmol/L — ABNORMAL HIGH (ref 98–111)
Creatinine, Ser: 1.45 mg/dL — ABNORMAL HIGH (ref 0.61–1.24)
GFR calc Af Amer: >60 mL/min (ref >60)
GFR calc non Af Amer: 54 mL/min — ABNORMAL LOW (ref >60)
Glucose, Bld: 163 mg/dL — ABNORMAL HIGH (ref 70–99)
Potassium: 4.9 mmol/L (ref 3.5–5.1)
Sodium: 151 mmol/L — ABNORMAL HIGH (ref 135–145)
Total Bilirubin: 0.7 mg/dL (ref 0.3–1.2)
Total Protein: 6.4 g/dL — ABNORMAL LOW (ref 6.5–8.1)

## 2019-08-13 LAB — GLUCOSE, CAPILLARY
Glucose-Capillary: 109 mg/dL — ABNORMAL HIGH (ref 70–99)
Glucose-Capillary: 117 mg/dL — ABNORMAL HIGH (ref 70–99)
Glucose-Capillary: 128 mg/dL — ABNORMAL HIGH (ref 70–99)
Glucose-Capillary: 113 mg/dL — ABNORMAL HIGH (ref 70–99)
Glucose-Capillary: 147 mg/dL — ABNORMAL HIGH (ref 70–99)
Glucose-Capillary: 113 mg/dL — ABNORMAL HIGH (ref 70–99)

## 2019-08-13 LAB — COOXEMETRY PANEL
Carboxyhemoglobin: 1.7 % — ABNORMAL HIGH (ref 0.5–1.5)
Methemoglobin: 0.9 % (ref 0.0–1.5)
O2 Saturation: 64.6 %
Total hemoglobin: 18.3 g/dL — ABNORMAL HIGH (ref 12.0–16.0)

## 2019-08-13 LAB — HEPARIN LEVEL (UNFRACTIONATED)
Heparin Unfractionated: 0.13 IU/mL — ABNORMAL LOW (ref 0.30–0.70)
Heparin Unfractionated: 0.26 IU/mL — ABNORMAL LOW (ref 0.30–0.70)

## 2019-08-13 LAB — LACTATE DEHYDROGENASE: LDH: 307 U/L — ABNORMAL HIGH (ref 98–192)

## 2019-08-13 LAB — PROTIME-INR
INR: 1.3 — ABNORMAL HIGH (ref 0.8–1.2)
Prothrombin Time: 15.2 seconds (ref 11.4–15.2)

## 2019-08-13 LAB — CBC
HCT: 34.6 % — ABNORMAL LOW (ref 39.0–52.0)
Hemoglobin: 9.8 g/dL — ABNORMAL LOW (ref 13.0–17.0)
MCH: 28.8 pg (ref 26.0–34.0)
MCHC: 28.3 g/dL — ABNORMAL LOW (ref 30.0–36.0)
MCV: 101.8 fL — ABNORMAL HIGH (ref 80.0–100.0)
Platelets: 323 10*3/uL (ref 150–400)
RBC: 3.4 MIL/uL — ABNORMAL LOW (ref 4.22–5.81)
RDW: 20.1 % — ABNORMAL HIGH (ref 11.5–15.5)
WBC: 15.2 10*3/uL — ABNORMAL HIGH (ref 4.0–10.5)
nRBC: 0.2 % (ref 0.0–0.2)

## 2019-08-13 LAB — MAGNESIUM: Magnesium: 1.9 mg/dL (ref 1.7–2.4)

## 2019-08-13 LAB — CYTOLOGY - NON PAP

## 2019-08-13 MED ORDER — FREE WATER
200.0000 mL | Status: DC
  Administered 2019-08-13 – 2019-08-14 (×6): 200 mL

## 2019-08-13 MED ORDER — SODIUM CHLORIDE 0.9 % IV SOLN
1.0000 g | Freq: Three times a day (TID) | INTRAVENOUS | Status: DC
  Administered 2019-08-13 – 2019-08-19 (×19): 1 g via INTRAVENOUS
  Filled 2019-08-13 (×21): qty 1

## 2019-08-13 MED ORDER — POTASSIUM CHLORIDE 20 MEQ/15ML (10%) PO SOLN: 40.0000 meq | Freq: Two times a day (BID) | ORAL | Status: DC

## 2019-08-13 MED ORDER — WARFARIN SODIUM 2 MG PO TABS
2.0000 mg | ORAL_TABLET | Freq: Once | ORAL | Status: AC
  Administered 2019-08-13: 2 mg via ORAL
  Filled 2019-08-13: qty 1

## 2019-08-13 MED ORDER — WARFARIN - PHARMACIST DOSING INPATIENT: Freq: Every day | Status: DC

## 2019-08-13 NOTE — Plan of Care (Signed)
  Problem: Education: Goal: Ability to demonstrate management of disease process will improve Outcome: Progressing   Problem: Education: Goal: Ability to verbalize understanding of medication therapies will improve Outcome: Progressing   Problem: Cardiac: Goal: Ability to achieve and maintain adequate cardiopulmonary perfusion will improve Outcome: Progressing   Problem: Clinical Measurements: Goal: Ability to maintain clinical measurements within normal limits will improve Outcome: Progressing

## 2019-08-13 NOTE — Progress Notes (Addendum)
Electrophysiology Rounding Note  Patient Name: Aaron [name] Date of Encounter: 08/13/2019  Primary Cardiologist: Dr. Haroldine Laws  Electrophysiologist: Dr. Rayann Heman  Subjective   Remains intubated and sedated.   Rhythm remains stable on quinidine + amiodarone. (last VT 8/6)  Now having recurrent fevers -> transitioned to meropenem.   Inpatient Medications    Scheduled Meds: . alteplase  2 mg Intracatheter Once  . aspirin  81 mg Per Tube Daily  . atorvastatin  80 mg Per Tube Daily  . bisacodyl  10 mg Rectal Once  . chlorhexidine gluconate (MEDLINE KIT)  15 mL Mouth Rinse BID  . Chlorhexidine Gluconate Cloth  6 each Topical Daily  . clonazePAM  1 mg Per Tube BID  . docusate  100 mg Per Tube BID  . feeding supplement (PROSource TF)  90 mL Per Tube BID  . fentaNYL (SUBLIMAZE) injection  100 mcg Intravenous Once  . folic acid  1 mg Per Tube Daily  . free water  20 mL Per Tube Q4H  . furosemide  40 mg Intravenous BID  . gabapentin  300 mg Per Tube Q8H  . insulin aspart  0-15 Units Subcutaneous Q4H  . insulin aspart  3 Units Subcutaneous Q4H  . mouth rinse  15 mL Mouth Rinse 10 times per day  . midazolam  2 mg Intravenous Once  . midodrine  15 mg Per Tube TID WC  . multivitamin  15 mL Per Tube Daily  . pantoprazole (PROTONIX) IV  40 mg Intravenous Q12H  . polyethylene glycol  17 g Oral Daily  . potassium chloride  40 mEq Per Tube TID  . quiNIDine sulfate  150 mg Per Tube Q6H  . sildenafil  20 mg Per Tube TID  . sodium chloride flush  10-40 mL Intracatheter Q12H  . thiamine  100 mg Per Tube Daily   Continuous Infusions: . sodium chloride    . sodium chloride 10 mL/hr at 08/27/2019 1100  . sodium chloride 20 mL/hr at 08/01/19 1700  . amiodarone 30 mg/hr (08/13/19 0700)  . dexmedetomidine (PRECEDEX) IV infusion 0.2 mcg/kg/hr (08/13/19 0700)  . feeding supplement (VITAL 1.5 CAL) 65 mL/hr at 08/13/19 0500  . fentaNYL infusion INTRAVENOUS Stopped (08/10/19 1256)  . heparin  1,150 Units/hr (08/13/19 0700)  . meropenem (MERREM) IV    . phenylephrine (NEO-SYNEPHRINE) Adult infusion 40 mcg/min (08/13/19 0712)   PRN Meds: Place/Maintain arterial line **AND** sodium chloride, sodium chloride, acetaminophen, fentaNYL, midazolam, sodium chloride flush   Vital Signs    Vitals:   08/13/19 0500 08/13/19 0600 08/13/19 0700 08/13/19 0728  BP: (!) 84/75 (!) 86/75 90/67   Pulse: (!) 59 62    Resp: (!) 25 (!) 29 17   Temp:    99.9 F (37.7 C)  TempSrc:      SpO2: 100% 100%    Weight: 86.1 kg     Height:        Intake/Output Summary (Last 24 hours) at 08/13/2019 0735 Last data filed at 08/13/2019 0700 Gross per 24 hour  Intake 2637.66 ml  Output 3870 ml  Net -1232.34 ml   Filed Weights   08/11/19 0500 08/02/2019 0426 08/13/19 0500  Weight: 88.2 kg 88 kg 86.1 kg    Physical Exam    GEN- The patient is chronically ill appearing, intubated and sedated.  Head- normocephalic, atraumatic. + ET tube in place.  Neck- supple Lungs- + Mechanical breathing sounds on vent.  Heart- +LVAD hum.  GI- soft Extremities- no clubbing  or cyanosis. + dependent edema into thighs.  Neuro- Intubated/sedated.   Labs    CBC Recent Labs    09/01/2019 0321 08/13/19 0446  WBC 13.2* 15.2*  HGB 9.0* 9.8*  HCT 31.3* 34.6*  MCV 96.3 101.8*  PLT 284 670   Basic Metabolic Panel Recent Labs    08/13/2019 0321 08/13/19 0446  NA 146* 151*  K 4.4 4.9  CL 110 114*  CO2 27 25  GLUCOSE 192* 163*  BUN 59* 54*  CREATININE 1.34* 1.45*  CALCIUM 8.2* 8.5*  MG 2.2 1.9   Liver Function Tests Recent Labs    08/26/2019 0321 08/13/19 0446  AST 87* 121*  ALT 50* 75*  ALKPHOS 101 92  BILITOT 0.3 0.7  PROT 5.9* 6.4*  ALBUMIN 1.9* 2.1*   No results for input(s): LIPASE, AMYLASE in the last 72 hours. Cardiac Enzymes No results for input(s): CKTOTAL, CKMB, CKMBINDEX, TROPONINI in the last 72 hours.   Telemetry    SB/NSR 50-60s (personally reviewed)  Radiology    DG CHEST PORT  1 VIEW  Result Date: 08/11/2019 CLINICAL DATA:  Status post endotracheal tube placement. EXAM: PORTABLE CHEST 1 VIEW COMPARISON:  Single-view of the chest 08/11/2019. FINDINGS: Endotracheal tube is in place with the tip in good position 3 cm above the carina. Right PICC with its tip in the lower superior vena cava and NG tube coursing into the stomach are again noted. Left ventricular assist device in place. Cardiomegaly and pulmonary edema persist. There is increased right basilar atelectasis. No pneumothorax. IMPRESSION: ETT in good position. Cardiomegaly and pulmonary edema. Increased right basilar atelectasis. Electronically Signed   By: Inge Rise M.D.   On: 08/17/2019 12:11   DG Abd Portable 1V  Result Date: 08/18/2019 CLINICAL DATA:  Enteric tube placement. EXAM: PORTABLE ABDOMEN - 1 VIEW COMPARISON:  Abdominal x-ray dated August 07, 2019. FINDINGS: Enteric tube in the stomach. The bowel gas pattern is normal. No radio-opaque calculi or other significant radiographic abnormality are seen. LVAD noted. IMPRESSION: 1. Enteric tube in the stomach. Electronically Signed   By: Titus Dubin M.D.   On: 08/25/2019 15:13    Patient Profile     Aaron [name] a 54 y.o.malewith a past medical history significant for CAD and recent LVAD placement. Post op course complicated by incessant VT. Quieted and transferred to CIR where he had recurrent VF. Re-admitted to ICU.  - Had refractory VF throughout the night 7/28 w/ ~10 shocks. Now intubated.  - Developed bradycardia and further VF 7/30 am, TTVP + IABP placed 7/30 am.  - 8/1 completed cefepime, switched to ancef. Had VF 08/02/19 shock x1  - 8/3 Echo ramp, increased speed to 5400. VF x3. Failed IABP wean. Speed decreased to 5300.  - 08/05/19 VF shock x1 - 8/5 IABP removed. VT shock x 3 when sedation weaned and patient agitated.  - 8/6 Extubated, VT with shock x 3, reintubated.  Assessment & Plan    1.Refractory VF Pt had refractory  VF on amiodarone and lidocaine -> now stable on quinidine 150 mg q6 hrs and amiodarone gtt.  No further VT since 8/6. BP does not allow titration of BB at this time.  Pt remains critically ill; Now with recurrent fever and transition to meropenem.  Check EKG in am.   Prognosis remains guarded.   For questions or updates, please contact [facility] Please consult [email or web address] for contact info under Cardiology/STEMI.  Signed, Shirley Friar, PA-C  08/13/2019, 7:35 AM  I have seen, examined the patient, and reviewed the above assessment and plan.  Changes to above are made where necessary.  On exam, ill appearing, ventilated  Qt appears stable, artifact limits ekg interpretation.  Continue current therapy.  Ep to follow closely   Co Sign: Thompson Grayer, MD 08/13/2019 9:31 AM

## 2019-08-13 NOTE — Progress Notes (Signed)
NAME:  Aaron [name], MRN:  [id number], DOB:  [birth year], LOS: 64 ADMISSION DATE:  07/31/2019, CONSULTATION DATE:  07/30/19 REFERRING MD:  Heart Failure, CHIEF COMPLAINT:  Respiratory failure -- VF, LVAD   Brief History   54 yo PMH acute systolic heart failure s/p LVAD (06/25/19), recurrent VT/VF/Torsades, multivessel CAD, R renal mass c/f RCC, suspected OSA, who presents back to ICU after prolonged hospital course after which he was transferred to [facility] 7/23. At CIR, pt developed dyspnea, hypoxia following PT 7/27. ECG obtained and reveals VF rate 210 bpm. Transferred to CVICU 7/27 for emergent DCCV. Intubated by anesthesia. DCCV performed numerous times 7/27, 7/28. Remains intubated requiring frequent cardioversion.  PCCM consulted 7/29 for vent management   Past Medical History  Acute systolic heart failure s/p LVAD CAD Recurrent VF, VT, torsades  Renal mass Debility Sleep apnea   Significant Hospital Events   6/16 admitted with acute anterolateral STEMI, cardiogenic shock requiring IABP 6/25 LVAD placement 7/03 VT/VT/Torsades requiring multiple emergent DCCV  7/06 recurrent VT/VT/Torsades requiring multiple emergent DCCV  7/07 recurrent VT/VT/Torsades requiring multiple emergent DCCV  7/08 PNA, respiratory failure, required intubation 7/13 IABP removed, extubated 7/23 discharred to CIR 7/27 recurrent VF, admit to CVICU for DCCV. Intubated 7/28 recurrent VF, DCCV multiple times 7/29 PCCM consult for vent  7/30 vt overnight defib x2 7/31 DCCV x2, improved on versed / no further shocks 7/31>> present, intermittent vtach/shocks correlating with agitation 8/6 failed extubation trial  Consults:  EP PCCM  Procedures:  ETT 7/27 >> exchanged 8/11 (cuff leak ) >> Art line 7/28 >> RUE PICC 6/28 >>  R Femoral Venous Sheath 7/30 >> out IABP 7/30 >> out  Significant Diagnostic Tests:  EGD 8/11 neg  Bscopy 8/11 >> for hemoptysis, mucus mixed with blood especially right-sided  airways, stringy endobronchial mucoid masslike lesion right middle lobe, could not be fully suctioned out Micro Data:  resp 7/29 >> klebsiella resp 8/8 >>>klebs >> R to ampi/ unasyn   Antimicrobials:  Cefepime 7/28-8/1 Ancef 8/1 -8/3 Zosyn 8/8 >> 8/11 ceftx 8/11 >> 8/12 Meropenem 8/12 >>  Interim history/subjective:   Critically ill, intubated Febrile 101 Good urine output  Objective   Blood pressure (!) 86/73, pulse 64, temperature 99.2 F (37.3 C), resp. rate (!) 24, height 5\' 9"  (1.753 m), weight 86.1 kg, SpO2 100 %. CVP:  [6 mmHg-10 mmHg] 6 mmHg  Vent Mode: PRVC FiO2 (%):  [40 %] 40 % Set Rate:  [18 bmp] 18 bmp Vt Set:  [560 mL] 560 mL PEEP:  [5 cmH20] 5 cmH20 Pressure Support:  [8 cmH20] 8 cmH20 Plateau Pressure:  [20 cmH20-22 cmH20] 21 cmH20   Intake/Output Summary (Last 24 hours) at 08/13/2019 1519 Last data filed at 08/13/2019 1500 Gross per 24 hour  Intake 2907.17 ml  Output 4295 ml  Net -1387.83 ml   Filed Weights   08/11/19 0500 08/31/2019 0426 08/13/19 0500  Weight: 88.2 kg 88 kg 86.1 kg    Examination: Gen:    intubated, no distress  HEENT:  EOMI, sclera anicteric, mild pallor Neck:     No JVD; no thyromegaly Lungs:    Basal crackles , rhonchi CV:         LVAD hum Abd:      + bowel sounds; soft, non-tender; no palpable masses, no distension Ext:    No edema; adequate peripheral perfusion Skin:      Warm and dry; no rash Neuro: alert and oriented x 3   SvO2 65%  Labs 8/11 show decrease in BUN and creatinine, stable anemia, mild increase leukocytosis  Chest x-ray 8/11 independently reviewed, stable bilateral airspace disease  Resolved Hospital Problem list   Shock  Sepsis due to UTI, Klebsiella Pneumoniae PNA-cefazolin, complete 7 days total (8/3 end date)  Assessment & Plan:   Acute Hypoxic Respiratory Failure in setting of Recurrent VF/VT -VAP prevention measures  -Tolerating W UA , good tidal volumes on pressure support 8/5 but got  tachypneic and had to abort SBT  Klebsiella HAP -narrowed from Zosyn to ceftriaxone 8/11, changed to meropenem by CHF service today due to fever Plan to complete course of 7 days until 8/14 Await repeat blood cultures  Endobronchial lung mass -note that CT chest did not demonstrate 06/21/2019 and 07/21/2019, hence favor bronchoscopic finding was due to mucus mixed with blood rather than malignancy, unable to be suctioned out by disposable bronchoscope , await cytology  Sedation Needs for Mechanical Ventilation  -PAD protocol with Precedex RASS Goal -1 , intermittent fentanyl -Continue oral clonazepam  Recurrent VFib/Vtach requiring repeated DCCV -Cardiology / EP following  - on quinidine and amiodarone, EP following -Add beta-blocker now that off neo- -optimize electrolytes, goal K>4, Mg>2  Acute systolic heart failure s/p LVAD placement (06/25/19) - diuresis being held  UGI bleed/acute blood loss anemia -EGD neg Coagulopathy, on warfarin PTA -heparin per pharmacy, goal level 0.2-0.4, hold off on warfarin until certain that he does not require tracheostomy  At Risk Malnutrition in setting of Critical Illness -continue TF   Best practice:  Diet: TF  Pain/Anxiety/Delirium protocol (if indicated): Fentanyl + propofol gtt, PO klonipin VAP protocol (if indicated): yes DVT prophylaxis: heparin per pharmacy  GI prophylaxis: protonix  Glucose control: SSI Mobility: BR Code Status: Full  Family Communication: Per primary Disposition: ICU   The patient is critically ill with multiple organ systems failure and requires high complexity decision making for assessment and support, frequent evaluation and titration of therapies, application of advanced monitoring technologies and extensive interpretation of multiple databases. Critical Care Time devoted to patient care services described in this note independent of APP/resident  time is 32 minutes.      Kara Mead MD. [place] Flood. [place]  [facility]  If no response to pager , please call [phone]   08/13/2019

## 2019-08-13 NOTE — Progress Notes (Addendum)
[facility] for Heparin Indication: LVAD  Allergies  Allergen Reactions  . Codeine Hives    Patient Measurements: Height: 5\' 9"  (175.3 cm) Weight: 86.1 kg (189 lb 13.1 oz) IBW/kg (Calculated) : 70.7 Heparin Dosing Weight: ~ 92 kg  Vital Signs: Temp: 99.9 F (37.7 C) (08/12 0728) Temp Source: Oral (08/12 0300) BP: 90/67 (08/12 0700) Pulse Rate: 64 (08/12 0742)  Labs: Recent Labs    08/11/19 0359 08/11/19 0359 08/11/2019 0321 08/13/19 0446  HGB 8.7*   < > 9.0* 9.8*  HCT 30.2*  --  31.3* 34.6*  PLT 256  --  284 323  LABPROT  --   --  14.9 15.2  INR  --   --  1.2 1.3*  HEPARINUNFRC 0.29*  --  0.29* 0.13*  CREATININE 1.48*  --  1.34* 1.45*   < > = values in this interval not displayed.    Estimated Creatinine Clearance: 62.6 mL/min (A) (by C-G formula based on SCr of 1.45 mg/dL (H)).  Assessment: 54 yo male s/p LVAD HM3 placement 6/24, followed by IABP 7/8-7/13. 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 IABP removed 8/5, heparin restarted for LVAD. Extubated and reintubated 8/6.   Heparin level is subtherapeutic at 0.13, on 1150 units/hr (unclear why lower). Hgb 9.8, plt 323. LDH 307. No infusion issues - no bleeding noted today per nursing.    Goal of Therapy:  Heparin level goal 0.2-0.5 w/ LVAD Monitor platelets by anticoagulation protocol: Yes   Plan:  Continue heparin infusion at 1150 units/hr - has been therapeutic for a while, will get level 6 hrs later and adjust pending result F/u ability to tolerate po anticoagulation eventually.  Antonietta Jewel, PharmD, [place] Clinical Pharmacist  Phone: [phone] 08/13/2019 8:19 AM  Please check AMION for all [facility] phone numbers After 10:00 PM, call [facility] [phone]  ADDENDUM Heparin level came back therapeutic at 0.26, on 1150 units/hr, on recheck. No s/sx of bleeding or infusion issues. Continue at same  rate and get level daily.  Okay per MD to restart warfarin. INR today is 1.3. Warfarin 2 mg tonight. Monitor daily INR.   Antonietta Jewel, PharmD, [facility] Clinical Pharmacist

## 2019-08-13 NOTE — Progress Notes (Addendum)
   Called by RN about fever of 101.3 after Tylenol dose. Came to see patient and he is diaphoretic. Following commands. Discussed with Dr. Haroldine Laws - will recheck blood and urine cultures and switch antibiotics to Meropenem. Ordered cultures and will talk with Pharmacy to help with ordering Meropenem.  Darreld Mclean, PA-C 08/13/2019 7:19 AM

## 2019-08-13 NOTE — Progress Notes (Addendum)
Patient ID: Aaron [name], male   DOB: 1965/04/09, 54 y.o.   MRN: [id number]   Advanced Heart Failure VAD Team Note  PCP-Cardiologist: No primary care provider on file.   Subjective:    - Had refractory VF throughout the night 7/28 w/ ~10 shocks. Now intubated.  - Developed bradycardia and further VF 7/30 am, TTVP + IABP placed 7/30 am.   - 8/1 completed cefepime, switched to ancef. Had VF 08/02/19 shock x1  - 8/3 Echo ramp, increased speed to 5400. VF x3. Failed IABP wean. Speed decreased to 5300.   - 08/05/19 VF shock x1 - 8/5 IABP removed.  VT shock x 3 when sedation weaned and patient agitated.  - 8/6 Extubated, VT with shock x 3, reintubated.  -8/8 Lidocaine stopped. Quinidine started  -8/11 EGD negative for bleed. + gastritis   Rhythm stable. On quinidine.  No further VT/VF. NSR HR 70s.  K 4.4 Mg 1.9   Co-ox 65%. On 20 mcg of NEO. MAPs in the 80s.   CVP 9.   Continues w/ persistent fevers. mTemp 101.3. WBC up to 15K.  Tracheal aspiratate also growing Klebsiella Pneumoniae. Repeat urine and blood cultures collected.   BAL cultures pending.    LVAD INTERROGATION:  HeartMate 3 LVAD:   Flow 4.5 liters/min, speed 5300, power 3.8, PI 3.2.  No PI events   Objective:    Vital Signs:   Temp:  [99 F (37.2 C)-101.3 F (38.5 C)] 101.3 F (38.5 C) (08/12 0300) Pulse Rate:  [29-142] 62 (08/12 0600) Resp:  [16-39] 17 (08/12 0700) BP: (84-114)/(50-92) 90/67 (08/12 0700) SpO2:  [91 %-100 %] 100 % (08/12 0600) Arterial Line BP: (67-103)/(57-93) 95/83 (08/12 0700) FiO2 (%):  [40 %] 40 % (08/12 0400) Weight:  [86.1 kg] 86.1 kg (08/12 0500) Last BM Date: 08/28/2019 Mean arterial Pressure 70s   Intake/Output:   Intake/Output Summary (Last 24 hours) at 08/13/2019 0714 Last data filed at 08/13/2019 0700 Gross per 24 hour  Intake 2637.74 ml  Output 3870 ml  Net -1232.26 ml     Physical Exam   CVP 9 General:  Intubated and sedated HEENT: + ETT otherwise Normal. Neck: Supple, JVP  ~10 cm. Carotids OK.  Cardiac:  Mechanical heart sounds with LVAD hum present.  Lungs:  Intubated. Clear bilaterally. No wheezing  Abdomen:  Soft, no HSM. No bruits or masses. Active bowel sounds  LVAD exit site: Well-healed and incorporated. Dressing dry and intact. No erythema or drainage. Stabilization device present and accurately applied. Driveline dressing changed daily per sterile technique. Extremities:  Warm and dry. No cyanosis, clubbing, rash. 1+ bilateral LE edema  Neuro:  Intubated and sedated  GU: + Foley   Telemetry   NSR HR 70s. No VT/VF  (personally reviewed)  Labs   Basic Metabolic Panel: Recent Labs  Lab 08/07/19 1546 08/07/19 1946 08/09/19 0340 08/09/19 0340 08/10/19 0336 08/10/19 0336 08/11/19 0359 08/30/2019 0321 08/13/19 0446  NA 135   < > 138  --  143  --  146* 146* 151*  K 4.3   < > 4.1  --  4.2  --  3.9 4.4 4.9  CL 99   < > 99  --  105  --  106 110 114*  CO2 22   < > 26  --  29  --  _0 GLUCOSE 169*   < > 220*  --  137*  --  178* 192* 163*  BUN 103*   < >  85*  --  76*  --  64* 59* 54*  CREATININE 2.10*   < > 1.61*  --  1.53*  --  1.48* 1.34* 1.45*  CALCIUM 8.7*   < > 8.5*   < > 8.5*   < > 8.2* 8.2* 8.5*  MG 2.0   < > 2.0  --  1.9  --  1.9 2.2 1.9  PHOS 4.7*  --   --   --   --   --   --   --   --    < > = values in this interval not displayed.    Liver Function Tests: Recent Labs  Lab 08/07/19 1546 08/10/19 0336 08/11/19 0359 08/03/2019 0321 08/13/19 0446  AST 87* 87* 78* 87* 121*  ALT 25 47* 46* 50* 75*  ALKPHOS 157* 120 105 101 92  BILITOT 0.6 0.7 0.8 0.3 0.7  PROT 6.4* 6.0* 5.8* 5.9* 6.4*  ALBUMIN 2.2* 2.0* 1.9* 1.9* 2.1*   No results for input(s): LIPASE, AMYLASE in the last 168 hours. No results for input(s): AMMONIA in the last 168 hours.  CBC: Recent Labs  Lab 08/07/19 1546 08/08/19 0310 08/10/19 0336 08/10/19 0910 08/11/19 0359 08/17/2019 0321 08/13/19 0446  WBC 15.5*   < > 10.8* 11.4* 12.0* 13.2* 15.2*  NEUTROABS  13.2*  --   --   --   --   --   --   HGB 9.7*   < > 9.1* 9.2* 8.7* 9.0* 9.8*  HCT 31.6*   < > 31.3* 31.4* 30.2* 31.3* 34.6*  MCV 90.5   < > 95.7 94.0 96.2 96.3 101.8*  PLT 277   < > 246 258 256 284 323   < > = values in this interval not displayed.    INR: Recent Labs  Lab 08/07/19 0528 08/08/19 0310 08/27/2019 0321 08/13/19 0446  INR 1.2 1.3* 1.2 1.3*    Other results:    Imaging   DG CHEST PORT 1 VIEW  Result Date: 08/22/2019 CLINICAL DATA:  Status post endotracheal tube placement. EXAM: PORTABLE CHEST 1 VIEW COMPARISON:  Single-view of the chest 08/11/2019. FINDINGS: Endotracheal tube is in place with the tip in good position 3 cm above the carina. Right PICC with its tip in the lower superior vena cava and NG tube coursing into the stomach are again noted. Left ventricular assist device in place. Cardiomegaly and pulmonary edema persist. There is increased right basilar atelectasis. No pneumothorax. IMPRESSION: ETT in good position. Cardiomegaly and pulmonary edema. Increased right basilar atelectasis. Electronically Signed   By: Inge Rise M.D.   On: 08/22/2019 12:11   DG Abd Portable 1V  Result Date: 09/01/2019 CLINICAL DATA:  Enteric tube placement. EXAM: PORTABLE ABDOMEN - 1 VIEW COMPARISON:  Abdominal x-ray dated August 07, 2019. FINDINGS: Enteric tube in the stomach. The bowel gas pattern is normal. No radio-opaque calculi or other significant radiographic abnormality are seen. LVAD noted. IMPRESSION: 1. Enteric tube in the stomach. Electronically Signed   By: Titus Dubin M.D.   On: 08/27/2019 15:13     Medications:     Scheduled Medications: . alteplase  2 mg Intracatheter Once  . aspirin  81 mg Per Tube Daily  . atorvastatin  80 mg Per Tube Daily  . bisacodyl  10 mg Rectal Once  . chlorhexidine gluconate (MEDLINE KIT)  15 mL Mouth Rinse BID  . Chlorhexidine Gluconate Cloth  6 each Topical Daily  . clonazePAM  1 mg Per Tube BID  .  docusate  100 mg Per  Tube BID  . feeding supplement (PROSource TF)  90 mL Per Tube BID  . fentaNYL (SUBLIMAZE) injection  100 mcg Intravenous Once  . folic acid  1 mg Per Tube Daily  . free water  20 mL Per Tube Q4H  . furosemide  40 mg Intravenous BID  . gabapentin  300 mg Per Tube Q8H  . insulin aspart  0-15 Units Subcutaneous Q4H  . insulin aspart  3 Units Subcutaneous Q4H  . mouth rinse  15 mL Mouth Rinse 10 times per day  . midazolam  2 mg Intravenous Once  . midodrine  15 mg Per Tube TID WC  . multivitamin  15 mL Per Tube Daily  . pantoprazole (PROTONIX) IV  40 mg Intravenous Q12H  . polyethylene glycol  17 g Oral Daily  . potassium chloride  40 mEq Per Tube TID  . quiNIDine sulfate  150 mg Per Tube Q6H  . sildenafil  20 mg Per Tube TID  . sodium chloride flush  10-40 mL Intracatheter Q12H  . thiamine  100 mg Per Tube Daily    Infusions: . sodium chloride    . sodium chloride 10 mL/hr at 08/14/2019 1100  . sodium chloride 20 mL/hr at 08/01/19 1700  . amiodarone 30 mg/hr (08/13/19 0700)  . cefTRIAXone (ROCEPHIN)  IV Stopped (08/11/2019 1600)  . dexmedetomidine (PRECEDEX) IV infusion 0.2 mcg/kg/hr (08/13/19 0700)  . feeding supplement (VITAL 1.5 CAL) 65 mL/hr at 08/13/19 0500  . fentaNYL infusion INTRAVENOUS Stopped (08/10/19 1256)  . heparin 1,150 Units/hr (08/13/19 0700)  . phenylephrine (NEO-SYNEPHRINE) Adult infusion 40 mcg/min (08/13/19 0712)    PRN Medications: Place/Maintain arterial line **AND** sodium chloride, sodium chloride, acetaminophen, fentaNYL, midazolam, sodium chloride flush   Assessment/Plan:    1. Refractory Ventricular Fibrillation  -multiple episodes ~10 of VF on 7/28 - multiple episodes 7/30 am => IABP placed as well as TTVP given relative bradycardia.  IABP and TTVP now out.  Continued to have VF 7/30 , 7/31, 8/1, 8/3, 8/4, 8/6.    - Ranexa and Mexiletine currently on hold (unable to crush and administer through tube).  - Sotalol not a good option given his history  of bradycardia.  - Lidocaine discontinued 8/8 - now on quinidine 150 mg q6h. No further VTVF  - Continue amiodarone gtt at 30 mg/hr.   - Keep K > 4.0 and Mg > 2.0. - Unable to tolerate extubation on 8/6 due to VT => occurs when he wakes up, presumably with stress.  Stable currently as intubated and sedated.  - Will follow ECG closely w/ quinidine.  If he continues to tolerate, will try weaning vent and extubating on quinidine.   - Appreciate EP's assistance   2. Acute Systolic Heart Failure s/p VAD - EF 06%, RV systolic function mildly reduced - HMIII LVAD placed 06/25/19.  - RV looks good on echo, cannula ok  - Co-ox 65% - avoid inotropes with VF, remains on midodrine 15 mg tid + phenylephrine 20 mcg/min.  - Off digoxin with bradycardia/AKI.  - Continue Heparin gtt, off warfarin currently. LDH up at 307 (247 yesterday).   - Volume status improved w/ IV Lasix, CVP 9 today. Continue 40 IV BID - Creatinine stable at 1.45  3. Acute hypoxic resp failure - intubated in setting of VF - vent management per PCCM.  When vent is weaned and he is awake, has VT/VF.  As above, will see if we can keep arrhythmias quiet  on quinidine + amiodarone and try again to extubate.   4. CAD - S/p Acute STEMI 6/21.Emergent cath showed severe multivessel disease w/ 75% mid- distal LM, 100% prox LAD, not amendable to PCI (unable to cross w/ guide wire, probable CTO), 100% prox LCx (probable CTO) and 100% ostial RCA (probable CTO). There were mildto moderate collaterals right to left to the LAD, minimal right to right collateralsan minimalleft to left collaterals.LVEF 25% -Hs trop peaked 27,000. - No targets for CABG / PCI.  - S/P HMIII VAD - Continue ASA + statin   5. Renal mass = RCC - MRI abdomen- 1.8 cm right renal lesion, most consistent with renal cell carcinoma. No evidence of renal vein involvement or nodal metastasis. - Discussed w/ Urology and R RCC is small and will just require  surveillance. Ok to proceed with VAD from their standpoint.   6. Urinary retention/UA - continue abx - foley replaced 8/10  - repeat urine culture given persistent fevers and leukocytosis   7. ID:  - ?HCAP => Klebsiella in trach aspirate from 7/29.  - Completed cefepime 08/02/19, switched to ancef (stopped 8/3) - now on Zosyn  - Blood CX repeated 08/04/19, NGTD - Continues w/ persistent fevers. Tm 101.3 WBC trending up slowly, 10>>11>>12>>13>>15.  - re-culture urine. Obtain repeat blood cultures. BAL cultures pending  - start meropenum    8. Anemia w/ Concern for GI Source  - RN noted mild rectal bleeding 8/8 and blood clot from mouth 8/10 - hgb stable today, 9.2>>8.7>>9.0>9.8 - on anticoagulation for LVAD - EGD 8/11 unremarkable. No bleeding  - transfuse if hgb drops <7. 0   9. AKI - Improving  - Creatinine 1.87>>2.13>>1.99>>1.65>>1.53>>1.43>>1.34>>1.45 - follow BMP w/ diuresis    Lyda Jester, PA-C 08/13/2019  7:14 AM   Agree with above.   Awake on vent and will follow commands. VT/VF quiescent on quinidine. Now with fevers to 103. WBC climbing. VAD interrogated personally. Parameters stable.  General:  Ill appearing on vent. Awake. Following commands  HEENT: normal + ETT Neck: supple. JVP not elevated.  Carotids 2+ bilat; no bruits. No lymphadenopathy or thryomegaly appreciated. Cor: LVAD hum.  Lungs: Coarse Abdomen: soft, nontender, non-distended. No hepatosplenomegaly. No bruits or masses. Good bowel sounds. Driveline site clean. Anchor in place.  Extremities: no cyanosis, clubbing, rash. Warm trace edema  Neuro: awake on vent follows commands   VT/VF quiescent. EGD with no obvious bleeding. Now with worsening fevers and leukocytosis. Reculture. Switch to meropenem. May need fungal coverage. Hold off on extubation. Hold diuretics today with risk of sepsis. Can resume warfarin.   CRITICAL CARE Performed by: Glori Bickers  Total critical care time: 35  minutes  Critical care time was exclusive of separately billable procedures and treating other patients.  Critical care was necessary to treat or prevent imminent or life-threatening deterioration.  Critical care was time spent personally by me (independent of midlevel providers or residents) on the following activities: development of treatment plan with patient and/or surrogate as well as nursing, discussions with consultants, evaluation of patient's response to treatment, examination of patient, obtaining history from patient or surrogate, ordering and performing treatments and interventions, ordering and review of laboratory studies, ordering and review of radiographic studies, pulse oximetry and re-evaluation of patient's condition.  Glori Bickers, MD  11:31 AM

## 2019-08-13 NOTE — Progress Notes (Signed)
Patient bladder scanned at 1350 with a volume of 689 in his bladder. CCM paged for orders to in and out. 800cc collected with in and out. Patient due to void by 2000.

## 2019-08-14 LAB — CBC
HCT: 32.4 % — ABNORMAL LOW (ref 39.0–52.0)
Hemoglobin: 9.3 g/dL — ABNORMAL LOW (ref 13.0–17.0)
MCH: 28.5 pg (ref 26.0–34.0)
MCHC: 28.7 g/dL — ABNORMAL LOW (ref 30.0–36.0)
MCV: 99.4 fL (ref 80.0–100.0)
Platelets: 268 10*3/uL (ref 150–400)
RBC: 3.26 MIL/uL — ABNORMAL LOW (ref 4.22–5.81)
RDW: 20.3 % — ABNORMAL HIGH (ref 11.5–15.5)
WBC: 13.7 10*3/uL — ABNORMAL HIGH (ref 4.0–10.5)
nRBC: 0 % (ref 0.0–0.2)

## 2019-08-14 LAB — GLUCOSE, CAPILLARY
Glucose-Capillary: 115 mg/dL — ABNORMAL HIGH (ref 70–99)
Glucose-Capillary: 118 mg/dL — ABNORMAL HIGH (ref 70–99)
Glucose-Capillary: 122 mg/dL — ABNORMAL HIGH (ref 70–99)
Glucose-Capillary: 122 mg/dL — ABNORMAL HIGH (ref 70–99)
Glucose-Capillary: 122 mg/dL — ABNORMAL HIGH (ref 70–99)
Glucose-Capillary: 142 mg/dL — ABNORMAL HIGH (ref 70–99)

## 2019-08-14 LAB — COMPREHENSIVE METABOLIC PANEL
ALT: 144 U/L — ABNORMAL HIGH (ref 0–44)
AST: 219 U/L — ABNORMAL HIGH (ref 15–41)
Albumin: 2.2 g/dL — ABNORMAL LOW (ref 3.5–5.0)
Alkaline Phosphatase: 81 U/L (ref 38–126)
Anion gap: 11 (ref 5–15)
BUN: 58 mg/dL — ABNORMAL HIGH (ref 6–20)
CO2: 25 mmol/L (ref 22–32)
Calcium: 8.5 mg/dL — ABNORMAL LOW (ref 8.9–10.3)
Chloride: 117 mmol/L — ABNORMAL HIGH (ref 98–111)
Creatinine, Ser: 1.58 mg/dL — ABNORMAL HIGH (ref 0.61–1.24)
GFR calc Af Amer: 56 mL/min — ABNORMAL LOW (ref >60)
GFR calc non Af Amer: 49 mL/min — ABNORMAL LOW (ref >60)
Glucose, Bld: 151 mg/dL — ABNORMAL HIGH (ref 70–99)
Potassium: 4.4 mmol/L (ref 3.5–5.1)
Sodium: 153 mmol/L — ABNORMAL HIGH (ref 135–145)
Total Bilirubin: 0.8 mg/dL (ref 0.3–1.2)
Total Protein: 6.3 g/dL — ABNORMAL LOW (ref 6.5–8.1)

## 2019-08-14 LAB — COOXEMETRY PANEL
Carboxyhemoglobin: 1.7 % — ABNORMAL HIGH (ref 0.5–1.5)
Methemoglobin: 1 % (ref 0.0–1.5)
O2 Saturation: 66.1 %
Total hemoglobin: 15 g/dL (ref 12.0–16.0)

## 2019-08-14 LAB — PROTIME-INR
INR: 1.3 — ABNORMAL HIGH (ref 0.8–1.2)
Prothrombin Time: 15.6 seconds — ABNORMAL HIGH (ref 11.4–15.2)

## 2019-08-14 LAB — URINE CULTURE: Culture: NO GROWTH

## 2019-08-14 LAB — LACTATE DEHYDROGENASE: LDH: 402 U/L — ABNORMAL HIGH (ref 98–192)

## 2019-08-14 LAB — HEPARIN LEVEL (UNFRACTIONATED): Heparin Unfractionated: 0.37 IU/mL (ref 0.30–0.70)

## 2019-08-14 LAB — MAGNESIUM: Magnesium: 2 mg/dL (ref 1.7–2.4)

## 2019-08-14 MED ORDER — SODIUM CHLORIDE 0.9 % IV SOLN
100.0000 mg | INTRAVENOUS | Status: AC
  Administered 2019-08-15 – 2019-08-17 (×3): 100 mg via INTRAVENOUS
  Filled 2019-08-14 (×3): qty 100

## 2019-08-14 MED ORDER — FENTANYL BOLUS VIA INFUSION
25.0000 ug | INTRAVENOUS | Status: DC | PRN
  Filled 2019-08-14: qty 100

## 2019-08-14 MED ORDER — FREE WATER
300.0000 mL | Status: DC
  Administered 2019-08-14 – 2019-08-15 (×5): 300 mL

## 2019-08-14 MED ORDER — WARFARIN SODIUM 2 MG PO TABS: 2.0000 mg | ORAL_TABLET | Freq: Once | ORAL | Status: DC

## 2019-08-14 MED ORDER — OXYBUTYNIN CHLORIDE 5 MG PO TABS
5.0000 mg | ORAL_TABLET | Freq: Two times a day (BID) | ORAL | Status: DC
  Administered 2019-08-14 – 2019-08-18 (×10): 5 mg via ORAL
  Filled 2019-08-14 (×11): qty 1

## 2019-08-14 MED ORDER — CHLORHEXIDINE GLUCONATE 0.12 % MT SOLN
OROMUCOSAL | Status: AC
  Administered 2019-08-14: 15 mL via OROMUCOSAL
  Filled 2019-08-14: qty 15

## 2019-08-14 MED ORDER — SODIUM CHLORIDE 0.9 % IV SOLN
200.0000 mg | Freq: Once | INTRAVENOUS | Status: AC
  Administered 2019-08-14: 200 mg via INTRAVENOUS
  Filled 2019-08-14: qty 200

## 2019-08-14 NOTE — Progress Notes (Addendum)
NAME:  Aaron [name], MRN:  [id number], DOB:  01/29/1965, LOS: 75 ADMISSION DATE:  07/07/2019, CONSULTATION DATE:  07/30/19 REFERRING MD:  Heart Failure, CHIEF COMPLAINT:  Respiratory failure -- VF, LVAD   Brief History   54 yo PMH acute systolic heart failure s/p LVAD (06/25/19), recurrent VT/VF/Torsades, multivessel CAD, R renal mass c/f RCC, suspected OSA, who presents back to ICU after prolonged hospital course after which he was transferred to [facility] 7/23. At CIR, pt developed dyspnea, hypoxia following PT 7/27. ECG obtained and reveals VF rate 210 bpm. Transferred to CVICU 7/27 for emergent DCCV. Intubated by anesthesia. DCCV performed numerous times 7/27, 7/28. Remains intubated requiring frequent cardioversion.  PCCM consulted 7/29 for vent management   Past Medical History  Acute systolic heart failure s/p LVAD CAD Recurrent VF, VT, torsades  Renal mass Debility Sleep apnea   Significant Hospital Events   6/16 admitted with acute anterolateral STEMI, cardiogenic shock requiring IABP 6/25 LVAD placement 7/03 VT/VT/Torsades requiring multiple emergent DCCV  7/06 recurrent VT/VT/Torsades requiring multiple emergent DCCV  7/07 recurrent VT/VT/Torsades requiring multiple emergent DCCV  7/08 PNA, respiratory failure, required intubation 7/13 IABP removed, extubated 7/23 discharred to CIR 7/27 recurrent VF, admit to CVICU for DCCV. Intubated 7/28 recurrent VF, DCCV multiple times 7/29 PCCM consult for vent  7/30 vt overnight defib x2 7/31 DCCV x2, improved on versed / no further shocks 7/31>> present, intermittent vtach/shocks correlating with agitation 8/6 failed extubation trial 8/12 fever  Consults:  EP PCCM  Procedures:  ETT 7/27 >> exchanged 8/11 (cuff leak ) >> Art line 7/28 >> RUE PICC 6/28 >>  R Femoral Venous Sheath 7/30 >> out IABP 7/30 >> out  Significant Diagnostic Tests:  EGD 8/11 neg  Bscopy 8/11 >> for hemoptysis, mucus mixed with blood especially  right-sided airways, stringy endobronchial mucoid masslike lesion right middle lobe, could not be fully suctioned out Micro Data:  resp 7/29 >> klebsiella resp 8/8 >>>klebs >> R to ampi/ unasyn 8/12 blood >> 8/12 resp >>   Antimicrobials:  Cefepime 7/28-8/1 Ancef 8/1 -8/3 Zosyn 8/8 >> 8/11 ceftx 8/11 >> 8/12 Meropenem 8/12 >>  Interim history/subjective:   Febrile 102 Critically ill, intubated, on low-dose Neo-Synephrine Good urine output with Lasix  Objective   Blood pressure 117/77, pulse 76, temperature (!) 102.1 F (38.9 C), temperature source Oral, resp. rate (!) 36, height 5\' 9"  (1.753 m), weight 87 kg, SpO2 100 %. CVP:  [6 mmHg-9 mmHg] 9 mmHg  Vent Mode: CPAP;PSV FiO2 (%):  [40 %] 40 % Set Rate:  [18 bmp] 18 bmp Vt Set:  [560 mL] 560 mL PEEP:  [5 cmH20] 5 cmH20 Pressure Support:  [10 cmH20] 10 cmH20 Plateau Pressure:  [17 cmH20-23 cmH20] 17 cmH20   Intake/Output Summary (Last 24 hours) at 08/14/2019 1022 Last data filed at 08/14/2019 0700 Gross per 24 hour  Intake 2575.2 ml  Output 2620 ml  Net -44.8 ml   Filed Weights   08/04/2019 0426 08/13/19 0500 08/14/19 0500  Weight: 88 kg 86.1 kg 87 kg    Examination: Gen:      elderly man, no distress  HEENT:  EOMI, sclera anicteric, mild pallor Neck:     No JVD; no thyromegaly Lungs:    Basal crackles , bilateral scattered rhonchi CV:         LVAD hum Abd:      + bowel sounds; soft, non-tender; no palpable masses, no distension Ext:    No edema; adequate peripheral perfusion  Skin:      Warm and dry; no rash Neuro: Awake, agitated, RASS +2, on low-dose Precedex    SvO2 66% Labs 8/13 show hypernatremia, hyperchloremia, slight increase in creatinine, decreasing leukocytosis  Chest x-ray 8/11 independently reviewed, increased right basal infiltrate  Resolved Hospital Problem list   Shock  Sepsis due to UTI, Klebsiella Pneumoniae PNA-cefazolin, complete 7 days total (8/3 end date)  Assessment & Plan:   Acute  Hypoxic Respiratory Failure in setting of Recurrent VF/VT -VAP prevention measures  -Has made some progress with pressure support weaning but very tachypneic on lower pressure support -If does not make progress over weekend, may plan for trach next week  Klebsiella HAP -narrowed from Zosyn to ceftriaxone 8/11, changed to meropenem by CHF service 8/12 due to fever Plan to complete course of 7 days until 8/14 Persistent fever although WBC decreasing, Klebsiella being treated, follow-up cultures from 8/12 , if negative consider drug fever, empirically adding Diflucan  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-note that CT chest did not demonstrate 06/21/2019 and 07/21/2019, hence favor bronchoscopic finding was due to mucus mixed with blood rather than malignancy, unable to be suctioned out by disposable bronchoscope , neg cytology  Sedation Needs for Mechanical Ventilation  -PAD protocol with Precedex RASS Goal -1 , intermittent fentanyl -Continue oral clonazepam  Recurrent VFib/Vtach requiring repeated DCCV -Cardiology / EP following  - on quinidine and amiodarone, EP following -Holding beta-blocker while on Neo-Synephrine -optimize electrolytes, goal K>4, Mg>2  Acute systolic heart failure s/p LVAD placement (06/25/19) - diuresis being held  UGI bleed/acute blood loss anemia -EGD neg Coagulopathy, on warfarin PTA -heparin per pharmacy, goal level 0.2-0.4, would hold warfarin in case  Tracheostomy required  At Risk Malnutrition in setting of Critical Illness -continue TF   Best practice:  Diet: TF  Pain/Anxiety/Delirium protocol (if indicated): Fentanyl + propofol gtt, PO klonipin VAP protocol (if indicated): yes DVT prophylaxis: heparin per pharmacy  GI prophylaxis: protonix  Glucose control: SSI Mobility: BR Code Status: Full  Family Communication: Per primary Disposition: ICU    The patient is critically ill with multiple organ systems failure and requires high  complexity decision making for assessment and support, frequent evaluation and titration of therapies, application of advanced monitoring technologies and extensive interpretation of multiple databases. Critical Care Time devoted to patient care services described in this note independent of APP/resident  time is 32 minutes.    Kara Mead MD. [facility] Flood. [facility]  If no response to pager , please call [phone]   08/14/2019

## 2019-08-14 NOTE — Progress Notes (Signed)
Electrophysiology Rounding Note  Patient Name: Aaron [name] Date of Encounter: 08/14/2019  Primary Cardiologist: Dr. Haroldine Laws  Electrophysiologist: Dr. Rayann Heman   Subjective   Remains intubated and sedated.   Rhythm remains stable.  Inpatient Medications    Scheduled Meds: . alteplase  2 mg Intracatheter Once  . aspirin  81 mg Per Tube Daily  . atorvastatin  80 mg Per Tube Daily  . bisacodyl  10 mg Rectal Once  . chlorhexidine gluconate (MEDLINE KIT)  15 mL Mouth Rinse BID  . Chlorhexidine Gluconate Cloth  6 each Topical Daily  . clonazePAM  1 mg Per Tube BID  . docusate  100 mg Per Tube BID  . feeding supplement (PROSource TF)  90 mL Per Tube BID  . fentaNYL (SUBLIMAZE) injection  100 mcg Intravenous Once  . folic acid  1 mg Per Tube Daily  . free water  200 mL Per Tube Q4H  . gabapentin  300 mg Per Tube Q8H  . insulin aspart  0-15 Units Subcutaneous Q4H  . insulin aspart  3 Units Subcutaneous Q4H  . mouth rinse  15 mL Mouth Rinse 10 times per day  . midazolam  2 mg Intravenous Once  . midodrine  15 mg Per Tube TID WC  . multivitamin  15 mL Per Tube Daily  . pantoprazole (PROTONIX) IV  40 mg Intravenous Q12H  . polyethylene glycol  17 g Oral Daily  . quiNIDine sulfate  150 mg Per Tube Q6H  . sildenafil  20 mg Per Tube TID  . sodium chloride flush  10-40 mL Intracatheter Q12H  . thiamine  100 mg Per Tube Daily  . Warfarin - Pharmacist Dosing Inpatient   Does not apply q1600   Continuous Infusions: . sodium chloride    . sodium chloride 10 mL/hr at 08/28/2019 1100  . sodium chloride 20 mL/hr at 08/01/19 1700  . amiodarone 30 mg/hr (08/14/19 0700)  . dexmedetomidine (PRECEDEX) IV infusion 0.1 mcg/kg/hr (08/14/19 0700)  . feeding supplement (VITAL 1.5 CAL) 65 mL/hr at 08/13/19 1900  . fentaNYL infusion INTRAVENOUS Stopped (08/10/19 1256)  . heparin 1,150 Units/hr (08/14/19 0700)  . meropenem (MERREM) IV 200 mL/hr at 08/14/19 0700  . phenylephrine  (NEO-SYNEPHRINE) Adult infusion 20 mcg/min (08/14/19 0700)   PRN Meds: Place/Maintain arterial line **AND** sodium chloride, sodium chloride, acetaminophen, fentaNYL, midazolam, sodium chloride flush   Vital Signs    Vitals:   08/14/19 0500 08/14/19 0600 08/14/19 0700 08/14/19 0718  BP:   (!) 84/48   Pulse:  (!) 31    Resp: (!) 24 (!) 30 (!) 28   Temp:    (!) 100.8 F (38.2 C)  TempSrc:      SpO2:  99%    Weight: 87 kg     Height:        Intake/Output Summary (Last 24 hours) at 08/14/2019 0723 Last data filed at 08/14/2019 0700 Gross per 24 hour  Intake 2991.89 ml  Output 3370 ml  Net -378.11 ml   Filed Weights   08/08/2019 0426 08/13/19 0500 08/14/19 0500  Weight: 88 kg 86.1 kg 87 kg    Physical Exam    GEN- The patient is intubated and sedated. Responds to voice.  HEENT- + ET tube.  Lungs- +mechanical breathing sounds.  Heart- Regular rate and rhythm, no murmurs, rubs or gallops GI- soft Extremities- no clubbing or cyanosis. No edema Skin- no rash or lesion Neuro- intubated and sedated.    Labs  CBC Recent Labs    08/13/19 0446 08/14/19 0443  WBC 15.2* 13.7*  HGB 9.8* 9.3*  HCT 34.6* 32.4*  MCV 101.8* 99.4  PLT 323 256   Basic Metabolic Panel Recent Labs    08/13/19 0446 08/14/19 0443  NA 151* 153*  K 4.9 4.4  CL 114* 117*  CO2 25 25  GLUCOSE 163* 151*  BUN 54* 58*  CREATININE 1.45* 1.58*  CALCIUM 8.5* 8.5*  MG 1.9 2.0   Liver Function Tests Recent Labs    08/13/19 0446 08/14/19 0443  AST 121* 219*  ALT 75* 144*  ALKPHOS 92 81  BILITOT 0.7 0.8  PROT 6.4* 6.3*  ALBUMIN 2.1* 2.2*   No results for input(s): LIPASE, AMYLASE in the last 72 hours. Cardiac Enzymes No results for input(s): CKTOTAL, CKMB, CKMBINDEX, TROPONINI in the last 72 hours.   Telemetry    NSR 60-80s (personally reviewed)  Radiology    DG CHEST PORT 1 VIEW  Result Date: 08/29/2019 CLINICAL DATA:  Status post endotracheal tube placement. EXAM: PORTABLE CHEST  1 VIEW COMPARISON:  Single-view of the chest 08/11/2019. FINDINGS: Endotracheal tube is in place with the tip in good position 3 cm above the carina. Right PICC with its tip in the lower superior vena cava and NG tube coursing into the stomach are again noted. Left ventricular assist device in place. Cardiomegaly and pulmonary edema persist. There is increased right basilar atelectasis. No pneumothorax. IMPRESSION: ETT in good position. Cardiomegaly and pulmonary edema. Increased right basilar atelectasis. Electronically Signed   By: Inge Rise M.D.   On: 08/28/2019 12:11   DG Abd Portable 1V  Result Date: 08/17/2019 CLINICAL DATA:  Enteric tube placement. EXAM: PORTABLE ABDOMEN - 1 VIEW COMPARISON:  Abdominal x-ray dated August 07, 2019. FINDINGS: Enteric tube in the stomach. The bowel gas pattern is normal. No radio-opaque calculi or other significant radiographic abnormality are seen. LVAD noted. IMPRESSION: 1. Enteric tube in the stomach. Electronically Signed   By: Titus Dubin M.D.   On: 09/01/2019 15:13    Patient Profile     Aaron Loescher Noguesis a 54 y.o.malewith a past medical history significant for CAD and recent LVAD placement. Post op course complicated by incessant VT. Quieted and transferred to CIR where he had recurrent VF. Re-admitted to ICU.  - Had refractory VF throughout the night 7/28 w/ ~10 shocks. Now intubated.  - Developed bradycardia and further VF 7/30 am, TTVP + IABP placed 7/30 am.  - 8/1 completed cefepime, switched to ancef. Had VF 08/02/19 shock x1  - 8/3 Echo ramp, increased speed to 5400. VF x3. Failed IABP wean. Speed decreased to 5300.  - 08/05/19 VF shock x1 - 8/5 IABP removed. VT shock x 3 when sedation weaned and patient agitated.  - 8/6 Extubated, VT with shock x 3, reintubated.  Assessment & Plan    1. Refractory VF Rhythm remains stable on IV amiodarone and quinidine 150 mg q 6 hrs.  No further VT since 08/07/19 No BB yet with BP and shock.    Pt remains critically ill.   2. Acute hypoxic respiratory failure in setting of recurrent VF/VT Remains febrile this am. On Meropenem. CCM following as well.  +Klebsiella HAP -> Narrowed from Zosyn to ceftriaxone 8/11 -> Meropenem 8/12 with recurrent fever.  Blood cultures pending.   For questions or updates, please contact [facility] Please consult [email or web address] for contact info under Cardiology/STEMI.  Signed, Shirley Friar, PA-C  08/14/2019, 7:23 AM

## 2019-08-14 NOTE — Progress Notes (Addendum)
[facility] for Heparin, warfarin Indication: LVAD  Allergies  Allergen Reactions  . Codeine Hives    Patient Measurements: Height: 5\' 9"  (175.3 cm) Weight: 87 kg (191 lb 12.8 oz) IBW/kg (Calculated) : 70.7 Heparin Dosing Weight: ~ 92 kg  Vital Signs: Temp: 100.8 F (38.2 C) (08/13 0718) Temp Source: Axillary (08/13 0400) BP: 84/48 (08/13 0700) Pulse Rate: 31 (08/13 0600)  Labs: Recent Labs    08/03/2019 0321 08/06/2019 0321 08/13/19 0446 08/13/19 1126 08/14/19 0443  HGB 9.0*   < > 9.8*  --  9.3*  HCT 31.3*  --  34.6*  --  32.4*  PLT 284  --  323  --  268  LABPROT 14.9  --  15.2  --  15.6*  INR 1.2  --  1.3*  --  1.3*  HEPARINUNFRC 0.29*   < > 0.13* 0.26* 0.37  CREATININE 1.34*  --  1.45*  --  1.58*   < > = values in this interval not displayed.    Estimated Creatinine Clearance: 57.7 mL/min (A) (by C-G formula based on SCr of 1.58 mg/dL (H)).  Assessment: 54 yo male s/p LVAD HM3 placement 6/24, followed by IABP 7/8-7/13. 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 IABP removed 8/5, heparin restarted for LVAD. Extubated and reintubated 8/6. Warfarin restarted on 8/12.  Heparin level is therapeutic at 0.37, on 1150 units/hr. INR 1.3. Hgb 9.3, plt 268. LDH 402. No infusion issues - no bleeding reported.    Goal of Therapy:  Heparin level goal 0.2-0.5 w/ LVAD Monitor platelets by anticoagulation protocol: Yes   Plan:  Continue heparin infusion at 1150 units/hr No warfarin today per MD Daily heparin level, CBC, INR Monitor for bleeding  Vertis Kelch, PharmD, BCPS Phone [phone] 08/14/2019       8:36 AM  Please check [email or web address] for unit-specific pharmacist phone numbers

## 2019-08-14 NOTE — Progress Notes (Addendum)
Patient ID: Aaron [name], male   DOB: 11-11-1965, 54 y.o.   MRN: [id number]   Advanced Heart Failure VAD Team Note  PCP-Cardiologist: No primary care provider on file.   Subjective:    - Had refractory VF throughout the night 7/28 w/ ~10 shocks. Now intubated.  - Developed bradycardia and further VF 7/30 am, TTVP + IABP placed 7/30 am.   - 8/1 completed cefepime, switched to ancef. Had VF 08/02/19 shock x1  - 8/3 Echo ramp, increased speed to 5400. VF x3. Failed IABP wean. Speed decreased to 5300.   - 08/05/19 VF shock x1 - 8/5 IABP removed.  VT shock x 3 when sedation weaned and patient agitated.  - 8/6 Extubated, VT with shock x 3, reintubated.  -8/8 Lidocaine stopped. Quinidine started  -8/11 EGD negative for bleed. + gastritis  -8/12 started on meropenum for persistent fevers and worsening leukocytosis   Remains febrile. mTemp 100.8. WBC trending back down w/ meropenum. 15>>13K today.   Urine, blood and BAL cultures all NGTD.   VT/VF quiescent on quinidine  Co-ox 66%.   Continues w/ urine retention, requiring frequent I/Os caths. Currently w/ condom cath.   He is alert on vent and following commands.  Diuretics currently on hold. CVP 8-9. Wt up +3 lb.   LDH trending up, 247>>307>>402 (? Due to infection). VAD parameters stable.    LVAD INTERROGATION:  HeartMate 3 LVAD:   Flow 4.5 liters/min, speed 5300, power 3.8, PI 3.8.  No PI events   Objective:    Vital Signs:   Temp:  [98.4 F (36.9 C)-101.1 F (38.4 C)] 100.8 F (38.2 C) (08/13 0718) Pulse Rate:  [27-74] 31 (08/13 0600) Resp:  [19-40] 28 (08/13 0700) BP: (70-108)/(48-80) 84/48 (08/13 0700) SpO2:  [93 %-100 %] 99 % (08/13 0600) Arterial Line BP: (75-97)/(53-85) 95/83 (08/13 0700) FiO2 (%):  [40 %] 40 % (08/13 0400) Weight:  [87 kg] 87 kg (08/13 0500) Last BM Date: 08/13/19 Mean arterial Pressure 70s   Intake/Output:   Intake/Output Summary (Last 24 hours) at 08/14/2019 0833 Last data filed at 08/14/2019  0700 Gross per 24 hour  Intake 2864.53 ml  Output 3370 ml  Net -505.47 ml     Physical Exam   CVP 8-9 General:  Intubated, awake on vent, follows commands. No distress  HEENT: + ETT otherwise Normal. Neck: Supple, JVP ~8 cm. Carotids OK.  Cardiac:  Mechanical heart sounds with LVAD hum present.  Lungs:  Intubated. CTAB  Abdomen:  Soft, no HSM. No bruits or masses. Active bowel sounds  LVAD exit site: Well-healed and incorporated. Dressing dry and intact. No erythema or drainage. Stabilization device present and accurately applied. Driveline dressing changed daily per sterile technique. Extremities:  Warm and dry. No cyanosis, clubbing, rash. 1+ bilateral LE edema  Neuro: awake on vent, follows commands   Telemetry   NSR. No futher No VT/VF  (personally reviewed)  Labs   Basic Metabolic Panel: Recent Labs  Lab 08/07/19 1546 08/07/19 1946 08/10/19 0336 08/10/19 0336 08/11/19 0359 08/11/19 0359 08/03/2019 0321 08/13/19 0446 08/14/19 0443  NA 135   < > 143  --  146*  --  146* 151* 153*  K 4.3   < > 4.2  --  3.9  --  4.4 4.9 4.4  CL 99   < > 105  --  106  --  110 114* 117*  CO2 22   < > 29  --  28  --  27  25 25  GLUCOSE 169*   < > 137*  --  178*  --  192* 163* 151*  BUN 103*   < > 76*  --  64*  --  59* 54* 58*  CREATININE 2.10*   < > 1.53*  --  1.48*  --  1.34* 1.45* 1.58*  CALCIUM 8.7*   < > 8.5*   < > 8.2*   < > 8.2* 8.5* 8.5*  MG 2.0   < > 1.9  --  1.9  --  2.2 1.9 2.0  PHOS 4.7*  --   --   --   --   --   --   --   --    < > = values in this interval not displayed.    Liver Function Tests: Recent Labs  Lab 08/10/19 0336 08/11/19 0359 08/21/2019 0321 08/13/19 0446 08/14/19 0443  AST 87* 78* 87* 121* 219*  ALT 47* 46* 50* 75* 144*  ALKPHOS 120 105 101 92 81  BILITOT 0.7 0.8 0.3 0.7 0.8  PROT 6.0* 5.8* 5.9* 6.4* 6.3*  ALBUMIN 2.0* 1.9* 1.9* 2.1* 2.2*   No results for input(s): LIPASE, AMYLASE in the last 168 hours. No results for input(s): AMMONIA in the last  168 hours.  CBC: Recent Labs  Lab 08/07/19 1546 08/08/19 0310 08/10/19 0910 08/11/19 0359 08/14/2019 0321 08/13/19 0446 08/14/19 0443  WBC 15.5*   < > 11.4* 12.0* 13.2* 15.2* 13.7*  NEUTROABS 13.2*  --   --   --   --   --   --   HGB 9.7*   < > 9.2* 8.7* 9.0* 9.8* 9.3*  HCT 31.6*   < > 31.4* 30.2* 31.3* 34.6* 32.4*  MCV 90.5   < > 94.0 96.2 96.3 101.8* 99.4  PLT 277   < > 258 256 284 323 268   < > = values in this interval not displayed.    INR: Recent Labs  Lab 08/08/19 0310 08/06/2019 0321 08/13/19 0446 08/14/19 0443  INR 1.3* 1.2 1.3* 1.3*    Other results:    Imaging   DG CHEST PORT 1 VIEW  Result Date: 08/21/2019 CLINICAL DATA:  Status post endotracheal tube placement. EXAM: PORTABLE CHEST 1 VIEW COMPARISON:  Single-view of the chest 08/11/2019. FINDINGS: Endotracheal tube is in place with the tip in good position 3 cm above the carina. Right PICC with its tip in the lower superior vena cava and NG tube coursing into the stomach are again noted. Left ventricular assist device in place. Cardiomegaly and pulmonary edema persist. There is increased right basilar atelectasis. No pneumothorax. IMPRESSION: ETT in good position. Cardiomegaly and pulmonary edema. Increased right basilar atelectasis. Electronically Signed   By: Inge Rise M.D.   On: 08/03/2019 12:11   DG Abd Portable 1V  Result Date: 08/08/2019 CLINICAL DATA:  Enteric tube placement. EXAM: PORTABLE ABDOMEN - 1 VIEW COMPARISON:  Abdominal x-ray dated August 07, 2019. FINDINGS: Enteric tube in the stomach. The bowel gas pattern is normal. No radio-opaque calculi or other significant radiographic abnormality are seen. LVAD noted. IMPRESSION: 1. Enteric tube in the stomach. Electronically Signed   By: Titus Dubin M.D.   On: 08/11/2019 15:13     Medications:     Scheduled Medications: . alteplase  2 mg Intracatheter Once  . aspirin  81 mg Per Tube Daily  . atorvastatin  80 mg Per Tube Daily  .  bisacodyl  10 mg Rectal Once  . chlorhexidine gluconate (  MEDLINE KIT)  15 mL Mouth Rinse BID  . Chlorhexidine Gluconate Cloth  6 each Topical Daily  . clonazePAM  1 mg Per Tube BID  . docusate  100 mg Per Tube BID  . feeding supplement (PROSource TF)  90 mL Per Tube BID  . fentaNYL (SUBLIMAZE) injection  100 mcg Intravenous Once  . folic acid  1 mg Per Tube Daily  . free water  200 mL Per Tube Q4H  . gabapentin  300 mg Per Tube Q8H  . insulin aspart  0-15 Units Subcutaneous Q4H  . insulin aspart  3 Units Subcutaneous Q4H  . mouth rinse  15 mL Mouth Rinse 10 times per day  . midazolam  2 mg Intravenous Once  . midodrine  15 mg Per Tube TID WC  . multivitamin  15 mL Per Tube Daily  . pantoprazole (PROTONIX) IV  40 mg Intravenous Q12H  . polyethylene glycol  17 g Oral Daily  . quiNIDine sulfate  150 mg Per Tube Q6H  . sildenafil  20 mg Per Tube TID  . sodium chloride flush  10-40 mL Intracatheter Q12H  . thiamine  100 mg Per Tube Daily  . Warfarin - Pharmacist Dosing Inpatient   Does not apply q1600    Infusions: . sodium chloride    . sodium chloride 10 mL/hr at 08/18/2019 1100  . sodium chloride 20 mL/hr at 08/01/19 1700  . amiodarone 30 mg/hr (08/14/19 0700)  . dexmedetomidine (PRECEDEX) IV infusion 0.1 mcg/kg/hr (08/14/19 0700)  . feeding supplement (VITAL 1.5 CAL) 65 mL/hr at 08/13/19 1900  . fentaNYL infusion INTRAVENOUS Stopped (08/10/19 1256)  . heparin 1,150 Units/hr (08/14/19 0700)  . meropenem (MERREM) IV 200 mL/hr at 08/14/19 0700  . phenylephrine (NEO-SYNEPHRINE) Adult infusion 20 mcg/min (08/14/19 0700)    PRN Medications: Place/Maintain arterial line **AND** sodium chloride, sodium chloride, acetaminophen, fentaNYL, midazolam, sodium chloride flush   Assessment/Plan:    1. Refractory Ventricular Fibrillation  -multiple episodes ~10 of VF on 7/28 - multiple episodes 7/30 am => IABP placed as well as TTVP given relative bradycardia.  IABP and TTVP now out.   Continued to have VF 7/30 , 7/31, 8/1, 8/3, 8/4, 8/6.    - Ranexa and Mexiletine currently on hold (unable to crush and administer through tube).  - Sotalol not a good option given his history of bradycardia.  - Lidocaine discontinued 8/8 - now on quinidine 150 mg q6h. No further VTVF  - Continue amiodarone gtt at 30 mg/hr.   - Keep K > 4.0 and Mg > 2.0. - Unable to tolerate extubation on 8/6 due to VT => occurs when he wakes up, presumably with stress. Rhythm remains stable thus far off sedation.   - Will follow ECG closely w/ quinidine.  If he continues to tolerate, will try weaning vent and extubating on quinidine.   - Appreciate EP's assistance   2. Acute Systolic Heart Failure s/p VAD - EF 85%, RV systolic function mildly reduced - HMIII LVAD placed 06/25/19.  - RV looks good on echo, cannula ok  - Co-ox 66% - avoid inotropes with VF, remains on midodrine 15 mg tid + phenylephrine 20 mcg/min.  - Off digoxin with bradycardia/AKI.  - Continue Heparin gtt, off warfarin currently. LDH trending up, 402 today. VAD parameters stable. Heparin levels therapeutic.  - Volume status ok. CVP 8-9. Holding diuretics for now w/ risk of sepsis.   3. Acute hypoxic resp failure - intubated in setting of VF - vent  management per PCCM.  When vent is weaned and he is awake, has VT/VF.  As above, will see if we can keep arrhythmias quiet on quinidine + amiodarone and try again to extubate.   4. CAD - S/p Acute STEMI 6/21.Emergent cath showed severe multivessel disease w/ 75% mid- distal LM, 100% prox LAD, not amendable to PCI (unable to cross w/ guide wire, probable CTO), 100% prox LCx (probable CTO) and 100% ostial RCA (probable CTO). There were mildto moderate collaterals right to left to the LAD, minimal right to right collateralsan minimalleft to left collaterals.LVEF 25% -Hs trop peaked 27,000. - No targets for CABG / PCI.  - S/P HMIII VAD - Continue ASA + statin   5. Renal mass =  RCC - MRI abdomen- 1.8 cm right renal lesion, most consistent with renal cell carcinoma. No evidence of renal vein involvement or nodal metastasis. - Discussed w/ Urology and R RCC is small and will just require surveillance. Ok to proceed with VAD from their standpoint.   6. Urinary retention - continue abx - repeat urine culture 8/12 NGTD - currently w/ condom cath but requiring frequent I/O caths - defer new foley cath placement to MD  7. ID:  - ?HCAP => Klebsiella in trach aspirate from 7/29.  - Completed cefepime 08/02/19, switched to ancef - now on meropenum (started 8/12) - Blood CX repeated 08/13/19, NGTD - Continues w/ persistent fevers. Tm 100. WBC trending back down w/ mero - repeat Urine and BCs pending. BAL cultures pending    8. Anemia w/ Concern for GI Source  - RN noted mild rectal bleeding 8/8 and blood clot from mouth 8/10 - hgb stable today, 9.2>>8.7>>9.0>9.8>9.3 - on anticoagulation for LVAD - EGD 8/11 unremarkable. No bleeding  - transfuse if hgb drops <7. 0   9. AKI -overall improved (SCr peaked to 2.13)  - SCr now trending back up, 1.58 today  - monitor   Lyda Jester, PA-C 08/14/2019  8:33 AM   Agree with above  Remains awake on vent. Weaned for 2 hours today then became tachypneic and put back on. Febrile to 102 despite addition of meropenem. WBC coming down slowly. No further VT on quinidine.   General:  Ill appearing on vent  HEENT: normal  + ETT Neck: supple. JVP not elevated.  Carotids 2+ bilat; no bruits. No lymphadenopathy or thryomegaly appreciated. Cor: LVAD hum.  Lungs: Coarse Abdomen: obese soft, nontender, non-distended. No hepatosplenomegaly. No bruits or masses. Good bowel sounds. Driveline site clean. Anchor in place.  Extremities: no cyanosis, clubbing, rash. Warm no edema  Neuro: awake on vent. Follows commands   VT quiescent. Remains febrile. Failing vent wean.   Plan d/w CCM - Continue quinidine - Continue broad  spectrum abx. Add empiric fungal coverage - Continue weaning attempts over weekend but suspect trach Monday - Hold coumadin. Continue heparin.  - Hold diuretics again today.   CRITICAL CARE Performed by: Glori Bickers  Total critical care time: 35 minutes  Critical care time was exclusive of separately billable procedures and treating other patients.  Critical care was necessary to treat or prevent imminent or life-threatening deterioration.  Critical care was time spent personally by me (independent of midlevel providers or residents) on the following activities: development of treatment plan with patient and/or surrogate as well as nursing, discussions with consultants, evaluation of patient's response to treatment, examination of patient, obtaining history from patient or surrogate, ordering and performing treatments and interventions, ordering and review of laboratory  studies, ordering and review of radiographic studies, pulse oximetry and re-evaluation of patient's condition.  Glori Bickers, MD  10:56 AM

## 2019-08-14 NOTE — Progress Notes (Addendum)
LVAD Coordinator Rounding Note:  HM III LVAD implanted on 06/25/19 by Dr. Orvan Seen under Destination Therapy criteria due to recent smoking history.  Pt's transferred to CIR on 07/24/19. Had refractory VF 07/03/2019 and transferred back to [facility]. IABP and temporary pacer placed 07/12/2019. Had frequent recurrent VT/VF. IABP and temp pacer d/c 08/04/19. Extubated 8/6. VT x 3, required reintubation. Passed blood clot from mouth & rectum; EGD completed 08/17/2019 with no signs of bleeding; gastritis noted. Poor tolerance of SBTs.     Pt remains intubated, sedated. VT/VF quiescent. Per bedside RN, pt weaned for 2 hours, but became tachypneic and had to go back to full vent support. + follows commands. She also reports urinary retention that has required I & O cath x 3. Will defer foley replacement decision to Dr Haroldine Laws. Temp 102 this AM; on broad spectrum antibiotics, will add fungal coverage per Dr Haroldine Laws.   Spoke with pt's sister [name] and updated on pt's status.   Vital signs: Temp: 101.7 HR: 63 Art. Line: 76/61 (69) Cuff BP: 89/76 (83) Doppler: not done  O2 Sat: 100% on 40% FiO2 5 peep rate 18 TV 560 Wt: 189.6>196.4>200.8>209>209.6>210.7>214.1>214.2>211.8>208>199.9>194.4>194.0>191.8  lbs  LVAD parameters: Speed:  5300 Flow: 4.6 Power:  3.8 w PI: 3.0 Hct: 29  Alarms: none Events: none  Fixed speed: 5300 Low speed limit: 5000    Drive Line:  Existing dressing clean, dry, intact. Anchor intact and accurately applied. Continue q 3 day dressing changes per bedside nurse. Next dressing change due: 08/15/19.      Labs:  LDH trend: 326>250>256>358>297>309>266>243>235>234>235>238>247>402  INR trend: 2.1>3.5>2.2>1.5>1.5>1.3>1.3>1.3>1.2>1.2>1.2>1.3  WBC trend: 14.0>11.1>11.4>12.0>13.2>13.7   Anticoagulation Plan: -INR Goal:  2.0 - 2.5 -ASA Dose: 81 mg - will keep going for CAD for 3 mths stop date 09/25/19  Device: N/A  IABP: - placed 07/04/19 - dc'd 08/06/19  Drips: - Amiodarone 30 mg/hr -  Heparin 1150 mg/hr  - Neo 40 mcg/kg/min - Precedex  - Tube feed 65 ml/hr   Infection: - 07/16/2019 - BCs>>negative - 07/06/2019 - urine culture>>negative - 07/30/19 - respiratory culture>>Klebsiella pneumoniae - 08/04/19 - BCs>> NGTD final -08/09/19- respiratory culture>> Klebsiella pneumoniae  Arrythmias:  - 08/01/2019 Vfib while in CIR; emergently transferred back to 2H>> DCCV back to NSR; Amio gtt - 07/14/2019 recurrent VF>>increased amio gtt; Lido bolus/gtt; DC-CV 200J to NSR - 07/15/2019 recurrent VF>>8 separate DC-CV throughout night; required pressors for BP support and re-intubation - 07/08/2019 recurrent VT/VF>>7 separate DC-CV throughout am; IABP and pacer urgently placed in Cath Lab - 07/31/19 recurrent VT/VF>>2 additional DC-CV after IABP placment - 08/01/19 recurrent VT/VF>> pt had 3 episodes of VT/VR with 4 DC-CV - 08/02/19 recurrent VT/VF>>one DC-CV to SR - 08/04/19 recurrent VT/VF>>5 DC-CV to SR - 08/05/19 recurrent VT/VF>> one DC-CV to SR - 08/06/19 recurrent VT/VF>> episode required 3 DC-CV to SR - 08/07/19 recurrent VT/VF>> 3 DCCV to SR  Respiratory:  - intubated 07/11/2019 ? Need trach  Plan/Recommendations:  1. Call VAD pager if any VAD equipment or drive line issues. 2. Every three day dressing changes per bedside nurse.  Emerson Monte RN [facility] Coordinator  Office: [phone]  24/7 Pager: [phone]

## 2019-08-15 ENCOUNTER — Inpatient Hospital Stay (HOSPITAL_COMMUNITY): Payer: BC Managed Care – PPO

## 2019-08-15 DIAGNOSIS — J150 Pneumonia due to Klebsiella pneumoniae: Secondary | ICD-10-CM

## 2019-08-15 LAB — LACTATE DEHYDROGENASE: LDH: 402 U/L — ABNORMAL HIGH (ref 98–192)

## 2019-08-15 LAB — BASIC METABOLIC PANEL
Anion gap: 10 (ref 5–15)
BUN: 52 mg/dL — ABNORMAL HIGH (ref 6–20)
CO2: 20 mmol/L — ABNORMAL LOW (ref 22–32)
Calcium: 8.2 mg/dL — ABNORMAL LOW (ref 8.9–10.3)
Chloride: 120 mmol/L — ABNORMAL HIGH (ref 98–111)
Creatinine, Ser: 1.4 mg/dL — ABNORMAL HIGH (ref 0.61–1.24)
GFR calc Af Amer: >60 mL/min (ref >60)
GFR calc non Af Amer: 56 mL/min — ABNORMAL LOW (ref >60)
Glucose, Bld: 162 mg/dL — ABNORMAL HIGH (ref 70–99)
Potassium: 4.1 mmol/L (ref 3.5–5.1)
Sodium: 150 mmol/L — ABNORMAL HIGH (ref 135–145)

## 2019-08-15 LAB — COOXEMETRY PANEL
Carboxyhemoglobin: 1.8 % — ABNORMAL HIGH (ref 0.5–1.5)
Methemoglobin: 1.2 % (ref 0.0–1.5)
O2 Saturation: 63.6 %
Total hemoglobin: 9.4 g/dL — ABNORMAL LOW (ref 12.0–16.0)

## 2019-08-15 LAB — HEPARIN LEVEL (UNFRACTIONATED): Heparin Unfractionated: 0.44 IU/mL (ref 0.30–0.70)

## 2019-08-15 LAB — CULTURE, BAL-QUANTITATIVE W GRAM STAIN: Culture: 3000 — AB

## 2019-08-15 LAB — GLUCOSE, CAPILLARY
Glucose-Capillary: 104 mg/dL — ABNORMAL HIGH (ref 70–99)
Glucose-Capillary: 109 mg/dL — ABNORMAL HIGH (ref 70–99)
Glucose-Capillary: 115 mg/dL — ABNORMAL HIGH (ref 70–99)
Glucose-Capillary: 124 mg/dL — ABNORMAL HIGH (ref 70–99)
Glucose-Capillary: 125 mg/dL — ABNORMAL HIGH (ref 70–99)
Glucose-Capillary: 131 mg/dL — ABNORMAL HIGH (ref 70–99)

## 2019-08-15 LAB — MAGNESIUM: Magnesium: 2.1 mg/dL (ref 1.7–2.4)

## 2019-08-15 LAB — PROTIME-INR
INR: 1.2 (ref 0.8–1.2)
Prothrombin Time: 15 seconds (ref 11.4–15.2)

## 2019-08-15 LAB — CBC
HCT: 33.3 % — ABNORMAL LOW (ref 39.0–52.0)
Hemoglobin: 9.4 g/dL — ABNORMAL LOW (ref 13.0–17.0)
MCH: 28.3 pg (ref 26.0–34.0)
MCHC: 28.2 g/dL — ABNORMAL LOW (ref 30.0–36.0)
MCV: 100.3 fL — ABNORMAL HIGH (ref 80.0–100.0)
Platelets: 260 10*3/uL (ref 150–400)
RBC: 3.32 MIL/uL — ABNORMAL LOW (ref 4.22–5.81)
RDW: 19.9 % — ABNORMAL HIGH (ref 11.5–15.5)
WBC: 14.1 10*3/uL — ABNORMAL HIGH (ref 4.0–10.5)
nRBC: 0 % (ref 0.0–0.2)

## 2019-08-15 MED ORDER — VANCOMYCIN HCL 1750 MG/350ML IV SOLN
1750.0000 mg | Freq: Once | INTRAVENOUS | Status: AC
  Administered 2019-08-15: 1750 mg via INTRAVENOUS
  Filled 2019-08-15: qty 350

## 2019-08-15 MED ORDER — VANCOMYCIN HCL IN DEXTROSE 1-5 GM/200ML-% IV SOLN
1000.0000 mg | Freq: Two times a day (BID) | INTRAVENOUS | Status: DC
  Administered 2019-08-16 – 2019-08-17 (×3): 1000 mg via INTRAVENOUS
  Filled 2019-08-15 (×3): qty 200

## 2019-08-15 MED ORDER — FREE WATER
300.0000 mL | Status: DC
  Administered 2019-08-15 – 2019-08-17 (×22): 300 mL

## 2019-08-15 NOTE — Progress Notes (Signed)
Patient ID: Aaron [name], male   DOB: 1965-02-17, 54 y.o.   MRN: [id number]   Advanced Heart Failure VAD Team Note  PCP-Cardiologist: No primary care provider on file.   Subjective:    - Had refractory VF throughout the night 7/28 w/ ~10 shocks. Now intubated.  - Developed bradycardia and further VF 7/30 am, TTVP + IABP placed 7/30 am.   - 8/1 completed cefepime, switched to ancef. Had VF 08/02/19 shock x1  - 8/3 Echo ramp, increased speed to 5400. VF x3. Failed IABP wean. Speed decreased to 5300.   - 08/05/19 VF shock x1 - 8/5 IABP removed.  VT shock x 3 when sedation weaned and patient agitated.  - 8/6 Extubated, VT with shock x 3, reintubated.  -8/8 Lidocaine stopped. Quinidine started  -8/11 EGD negative for bleed. + gastritis  -8/12 started on meropenum for persistent fevers and worsening leukocytosis  - 8/13 started Eraxis  Remains on vent. Was on PS for 2 hours yesterday then failed.   Remains febrile. Tm 102.1 . WBC 13.7 -> 14.1. Remains on meropenem. Eraxis added yesterday.   Has klebsiella in sputum. All other cx negative   VT/VF quiescent on quinidine,   Co-ox 64%.   Awake on vent. Follows commands but weak. Off lasix. On free water for hypernatremia  LDH trending stable at 402    LVAD INTERROGATION:  HeartMate 3 LVAD:   Flow 4.5 liters/min, speed 5300, power 4.0, PI 2.7.  VAD interrogated personally. Parameters stable.  Objective:    Vital Signs:   Temp:  [99.1 F (37.3 C)-102.1 F (38.9 C)] 101.2 F (38.4 C) (08/14 0755) Pulse Rate:  [27-67] 58 (08/14 1000) Resp:  [20-37] 23 (08/14 1000) BP: (75-109)/(55-92) 93/71 (08/14 1000) SpO2:  [82 %-100 %] 100 % (08/14 1000) Arterial Line BP: (74-99)/(46-85) 86/72 (08/14 1000) FiO2 (%):  [40 %] 40 % (08/14 0400) Weight:  [86.5 kg] 86.5 kg (08/14 0416) Last BM Date: 08/14/19 Mean arterial Pressure 70s   Intake/Output:   Intake/Output Summary (Last 24 hours) at 08/15/2019 1134 Last data filed at 08/15/2019  1000 Gross per 24 hour  Intake 3463.25 ml  Output 3145 ml  Net 318.25 ml     Physical Exam   CVP 10 General:  Intubated, awake on vent, follows commands. No distress  HEENT: normal  + ETT Neck: supple. JVP 10.  Carotids 2+ bilat; no bruits. No lymphadenopathy or thryomegaly appreciated. Cor: LVAD hum.  Lungs: Coarse [place]:  soft, nontender, non-distended. No hepatosplenomegaly. No bruits or masses. Good bowel sounds. Driveline site clean. Anchor in place.  Extremities: no cyanosis, clubbing, rash. Warm no edema  Neuro: awake on vent follows commands  Telemetry   S brady/NSR 40-60s . No futher No VT/VF  Personally reviewed  Labs   Basic Metabolic Panel: Recent Labs  Lab 08/10/19 0336 08/10/19 0336 08/11/19 0359 08/11/19 0359 08/23/2019 0321 08/13/19 0446 08/14/19 0443 08/15/19 0319  NA 143  --  146*  --  146* 151* 153*  --   K 4.2  --  3.9  --  4.4 4.9 4.4  --   CL 105  --  106  --  110 114* 117*  --   CO2 29  --  28  --  _0 --   GLUCOSE 137*  --  178*  --  192* 163* 151*  --   BUN 76*  --  64*  --  59* 54* 58*  --   CREATININE  1.53*  --  1.48*  --  1.34* 1.45* 1.58*  --   CALCIUM 8.5*   < > 8.2*   < > 8.2* 8.5* 8.5*  --   MG 1.9   < > 1.9  --  2.2 1.9 2.0 2.1   < > = values in this interval not displayed.    Liver Function Tests: Recent Labs  Lab 08/10/19 0336 08/11/19 0359 08/28/2019 0321 08/13/19 0446 08/14/19 0443  AST 87* 78* 87* 121* 219*  ALT 47* 46* 50* 75* 144*  ALKPHOS 120 105 101 92 81  BILITOT 0.7 0.8 0.3 0.7 0.8  PROT 6.0* 5.8* 5.9* 6.4* 6.3*  ALBUMIN 2.0* 1.9* 1.9* 2.1* 2.2*   No results for input(s): LIPASE, AMYLASE in the last 168 hours. No results for input(s): AMMONIA in the last 168 hours.  CBC: Recent Labs  Lab 08/11/19 0359 08/03/2019 0321 08/13/19 0446 08/14/19 0443 08/15/19 0319  WBC 12.0* 13.2* 15.2* 13.7* 14.1*  HGB 8.7* 9.0* 9.8* 9.3* 9.4*  HCT 30.2* 31.3* 34.6* 32.4* 33.3*  MCV 96.2 96.3 101.8* 99.4 100.3*  PLT  256 284 323 268 260    INR: Recent Labs  Lab 08/03/2019 0321 08/13/19 0446 08/14/19 0443 08/15/19 0319  INR 1.2 1.3* 1.3* 1.2    Other results:    Imaging   DG Chest Port 1 View  Result Date: 08/15/2019 CLINICAL DATA:  Acute respiratory failure EXAM: PORTABLE CHEST 1 VIEW COMPARISON:  08/21/2019 FINDINGS: Cardiac shadow remains enlarged. LVAD is again identified and stable. Postsurgical changes are noted. Endotracheal tube, gastric catheter and right-sided PICC line are again noted and stable. The lungs are well aerated bilaterally. Vascular congestion and mild pulmonary edema is again identified but slightly improved when compare with the prior exam. Increased right basilar opacity is again noted and stable. IMPRESSION: Slight improved aeration when compared with the prior exam. Electronically Signed   By: Inez Catalina M.D.   On: 08/15/2019 06:04     Medications:     Scheduled Medications:  alteplase  2 mg Intracatheter Once   aspirin  81 mg Per Tube Daily   atorvastatin  80 mg Per Tube Daily   bisacodyl  10 mg Rectal Once   chlorhexidine gluconate (MEDLINE KIT)  15 mL Mouth Rinse BID   Chlorhexidine Gluconate Cloth  6 each Topical Daily   clonazePAM  1 mg Per Tube BID   docusate  100 mg Per Tube BID   feeding supplement (PROSource TF)  90 mL Per Tube BID   folic acid  1 mg Per Tube Daily   free water  300 mL Per Tube Q3H   gabapentin  300 mg Per Tube Q8H   insulin aspart  0-15 Units Subcutaneous Q4H   insulin aspart  3 Units Subcutaneous Q4H   mouth rinse  15 mL Mouth Rinse 10 times per day   midodrine  15 mg Per Tube TID WC   multivitamin  15 mL Per Tube Daily   oxybutynin  5 mg Oral BID   pantoprazole (PROTONIX) IV  40 mg Intravenous Q12H   polyethylene glycol  17 g Oral Daily   quiNIDine sulfate  150 mg Per Tube Q6H   sildenafil  20 mg Per Tube TID   sodium chloride flush  10-40 mL Intracatheter Q12H   thiamine  100 mg Per Tube Daily     Infusions:  sodium chloride     sodium chloride 10 mL/hr at 08/09/2019 1100   sodium chloride 20  mL/hr at 08/01/19 1700   amiodarone 30 mg/hr (08/15/19 1000)   anidulafungin     dexmedetomidine (PRECEDEX) IV infusion 0.5 mcg/kg/hr (08/15/19 1000)   feeding supplement (VITAL 1.5 CAL) 1,000 mL (08/15/19 0811)   fentaNYL infusion INTRAVENOUS Stopped (08/14/19 1119)   heparin 1,150 Units/hr (08/15/19 1000)   meropenem (MERREM) IV Stopped (08/15/19 0604)   phenylephrine (NEO-SYNEPHRINE) Adult infusion 35 mcg/min (08/15/19 1000)    PRN Medications: Place/Maintain arterial line **AND** sodium chloride, sodium chloride, acetaminophen, fentaNYL, midazolam, sodium chloride flush   Assessment/Plan:    1. Refractory Ventricular Fibrillation  -multiple episodes ~10 of VF on 7/28 - multiple episodes 7/30 am => IABP placed as well as TTVP given relative bradycardia.  IABP and TTVP now out.  Continued to have VF 7/30 , 7/31, 8/1, 8/3, 8/4, 8/6.    - Ranexa and Mexiletine currently on hold (unable to crush and administer through tube).  - Sotalol not a good option given his history of bradycardia.  - Lidocaine discontinued 8/8 - now on quinidine 150 mg q6h.  - Continue amiodarone gtt at 30 mg/hr.   - Keep K > 4.0 and Mg > 2.0. - Unable to tolerate extubation on 8/6 due to VT => occurs when he wakes up, presumably with stress. Rhythm remains stable thus far off sedation.   - VT/VF quiescent on current regimen. D/w EP   2. Acute Systolic Heart Failure s/p VAD - EF 93%, RV systolic function mildly reduced - HMIII LVAD placed 06/25/19.  - RV looks good on echo, cannula ok  - Co-ox 64% - avoid inotropes with VF, remains on midodrine 15 mg tid + low dose NEO for BP support  - Off digoxin with bradycardia/AKI.  - Continue Heparin gtt, off warfarin currently for trach. LDH stable today at 402. VAD interrogated personally. Parameters stable. - Volume status up slightly CVP 10.  Holding diuretics for now w/ risk of sepsis and hypernatremia    3. Acute hypoxic resp failure - intubated in setting of VF - failing vent wean. Will likely need trach on Monday - vent management per PCCM.    4. CAD - S/p Acute STEMI 6/21.Emergent cath showed severe multivessel disease w/ 75% mid- distal LM, 100% prox LAD, not amendable to PCI (unable to cross w/ guide wire, probable CTO), 100% prox LCx (probable CTO) and 100% ostial RCA (probable CTO). There were mildto moderate collaterals right to left to the LAD, minimal right to right collateralsan minimalleft to left collaterals.LVEF 25% -Hs trop peaked 27,000. - No targets for CABG / PCI.  - S/P HMIII VAD - Continue ASA + statin  - No change  5. Renal mass = RCC - MRI abdomen- 1.8 cm right renal lesion, most consistent with renal cell carcinoma. No evidence of renal vein involvement or nodal metastasis. - Discussed w/ Urology and R RCC is small and will just require surveillance. Ok to proceed with VAD from their standpoint.   6. Urinary retention - continue abx - repeat urine culture 8/12 NGTD - currently w/ condom cath but requiring frequent I/O caths  7. ID:  - ?HCAP => Klebsiella in trach aspirate from 7/29.  - Completed cefepime 08/02/19, switched to ancef - now on meropenum (started 8/12) and Eraxis (start 8/13) - Blood CX repeated 08/13/19, NGTD - Continues w/ persistent fevers. Tm 102. ? Drug fever - repeat Urine and BCs pending. BAL cultures pending - continue current regimen    8. Anemia w/ Concern for GI Source  -  RN noted mild rectal bleeding 8/8 and blood clot from mouth 8/10 - hgb stable today, 9.2>>8.7>>9.0>9.8>9.3 - on anticoagulation for LVAD - EGD 8/11 unremarkable. No bleeding  - transfuse if hgb drops <7. 0   9. AKI -overall improved (SCr peaked to 2.13)  - will check BMET today  10 Hypernatremia - getting free water - repeat BMET  CRITICAL CARE Performed by: Glori Bickers  Total critical care time: 35 minutes  Critical care time was exclusive of separately billable procedures and treating other patients.  Critical care was necessary to treat or prevent imminent or life-threatening deterioration.  Critical care was time spent personally by me (independent of midlevel providers or residents) on the following activities: development of treatment plan with patient and/or surrogate as well as nursing, discussions with consultants, evaluation of patient's response to treatment, examination of patient, obtaining history from patient or surrogate, ordering and performing treatments and interventions, ordering and review of laboratory studies, ordering and review of radiographic studies, pulse oximetry and re-evaluation of patient's condition.     Glori Bickers, MD 08/15/2019  11:34 AM

## 2019-08-15 NOTE — Progress Notes (Addendum)
NAME:  Aaron [name], MRN:  [id number], DOB:  Jun 15, 1965, LOS: 47 ADMISSION DATE:  07/24/2019, CONSULTATION DATE:  07/30/19 REFERRING MD:  Heart Failure, CHIEF COMPLAINT:  Respiratory failure -- VF, LVAD   Brief History   54 yo PMH acute systolic heart failure s/p LVAD (06/25/19), recurrent VT/VF/Torsades, multivessel CAD, R renal mass c/f RCC, suspected OSA, who presents back to ICU after prolonged hospital course after which he was transferred to [facility] 7/23. At CIR, pt developed dyspnea, hypoxia following PT 7/27. ECG obtained and reveals VF rate 210 bpm. Transferred to CVICU 7/27 for emergent DCCV. Intubated by anesthesia. DCCV performed numerous times 7/27, 7/28. Remains intubated requiring frequent cardioversion.  PCCM consulted 7/29 for vent management   Past Medical History  Acute systolic heart failure s/p LVAD CAD Recurrent VF, VT, torsades  Renal mass Debility Sleep apnea   Significant Hospital Events   6/16 admitted with acute anterolateral STEMI, cardiogenic shock requiring IABP 6/25 LVAD placement 7/03 VT/VT/Torsades requiring multiple emergent DCCV  7/06 recurrent VT/VT/Torsades requiring multiple emergent DCCV  7/07 recurrent VT/VT/Torsades requiring multiple emergent DCCV  7/08 PNA, respiratory failure, required intubation 7/13 IABP removed, extubated 7/23 discharred to CIR 7/27 recurrent VF, admit to CVICU for DCCV. Intubated 7/28 recurrent VF, DCCV multiple times 7/29 PCCM consult for vent  7/30 vt overnight defib x2 7/31 DCCV x2, improved on versed / no further shocks 7/31>> present, intermittent vtach/shocks correlating with agitation 8/6 failed extubation trial 8/12 fever 8/13 Tolerated 2 hours trach collar however was re-sedated for reported anxiety and distress  Consults:  EP PCCM  Procedures:  ETT 7/27 >8/6> exchanged 8/11 (cuff leak ) >> Art line 7/28 >> RUE PICC 6/28 >>  R Femoral Venous Sheath 7/30 >> out IABP 7/30 >> out  Significant  Diagnostic Tests:  CT Chest 07/09/19 - 1. Interval placement LVAD with loculated appearing fluid in the anterior mediastinum and surrounding the outflow graft. This is nonspecific and although may represent postsurgical seroma, infection is not excluded. Clinical correlation is recommended. 2. Ill-defined faint high attenuating content above the aortic valve leaflets suboptimally characterized. Blood clot is not excluded. 3. Small bilateral pleural effusions and findings of asymmetric edema. Pneumonia is not excluded. 4. No bowel obstruction. Normal appendix. 5. A 4 mm nonobstructing right renal upper pole calculus. No hydronephrosis. 6. Aortic Atherosclerosis (ICD10-I70.0).  EGD 8/11 neg  Bscopy 8/11 >> for hemoptysis, mucus mixed with blood especially right-sided airways, stringy endobronchial mucoid masslike lesion right middle lobe, could not be fully suctioned out Micro Data:  resp 7/29 >> klebsiella resp 8/8 >>>klebs >> R to ampi/ unasyn BAL Cx 8/11>>3K colonies GNR 8/12 blood >> 8/12 resp >>   Antimicrobials:  Cefepime 7/28-8/1 Ancef 8/1 -8/3 Zosyn 8/8 >> 8/11 ceftx 8/11 >> 8/12 Meropenem 8/12 >>  Interim history/subjective:  Continues to be febrile to 102. Awake and follows simple commands however very weak in upper extremities On low-dose Neo for hypotension  Tolerated 2 hours on PS however became agitated and required sedation  Objective   Blood pressure (!) 75/60, pulse 61, temperature (!) 102 F (38.9 C), temperature source Axillary, resp. rate (!) 29, height _0  (1.753 m), weight 86.5 kg, SpO2 96 %. CVP:  [6 mmHg-11 mmHg] 6 mmHg  Vent Mode: PRVC FiO2 (%):  [40 %] 40 % Set Rate:  [18 bmp] 18 bmp Vt Set:  [560 mL] 560 mL PEEP:  [5 cmH20] 5 cmH20 Pressure Support:  [10 cmH20] 10 cmH20 Plateau Pressure:  [17  UXL24-40 cmH20] 20 cmH20   Intake/Output Summary (Last 24 hours) at 08/15/2019 0721 Last data filed at 08/15/2019 0600 Gross per 24 hour  Intake  3671.82 ml  Output 2975 ml  Net 696.82 ml   Filed Weights   08/13/19 0500 08/14/19 0500 08/15/19 0416  Weight: 86.1 kg 87 kg 86.5 kg   Physical Exam: General: Appears older than stated age, ill-appearing, no acute distress HENT: [place], AT, ETT in place Eyes: EOMI, no scleral icterus Respiratory: Diminished breath sounds bilaterally. No crackles, wheezing or rales Cardiovascular: LVAD hum GI: BS+, soft, nontender Extremities: Upper extremity edema Neuro: Awake, alert, follows commands, strength 1/5 in bilateral upper extremities, 5/5 in lower extremities   CBC Latest Ref Rng & Units 08/15/2019 08/14/2019 08/13/2019  WBC 4.0 - 10.5 K/uL 14.1(H) 13.7(H) 15.2(H)  Hemoglobin 13.0 - 17.0 g/dL 9.4(L) 9.3(L) 9.8(L)  Hematocrit 39 - 52 % 33.3(L) 32.4(L) 34.6(L)  Platelets 150 - 400 K/uL 260 268 323   CMP Latest Ref Rng & Units 08/14/2019 08/13/2019 08/02/2019  Glucose 70 - 99 mg/dL 151(H) 163(H) 192(H)  BUN 6 - 20 mg/dL 58(H) 54(H) 59(H)  Creatinine 0.61 - 1.24 mg/dL 1.58(H) 1.45(H) 1.34(H)  Sodium 135 - 145 mmol/L 153(H) 151(H) 146(H)  Potassium 3.5 - 5.1 mmol/L 4.4 4.9 4.4  Chloride 98 - 111 mmol/L 117(H) 114(H) 110  CO2 22 - 32 mmol/L _0 Calcium 8.9 - 10.3 mg/dL 8.5(L) 8.5(L) 8.2(L)  Total Protein 6.5 - 8.1 g/dL 6.3(L) 6.4(L) 5.9(L)  Total Bilirubin 0.3 - 1.2 mg/dL 0.8 0.7 0.3  Alkaline Phos 38 - 126 U/L 81 92 101  AST 15 - 41 U/L 219(H) 121(H) 87(H)  ALT 0 - 44 U/L 144(H) 75(H) 50(H)   SvO2 63% CXR 08/15/19 - Cardiomegaly, LVAD, ETT and PICC line in place, improved pulmonary edema, similar right basilar infiltrate Imaging, labs and test noted above have been reviewed independently by me.  Resolved Hospital Problem list   Sepsis due to UTI, Klebsiella Pneumoniae PNA-cefazolin, complete 7 days total (8/3 end date) Acute blood los anemia - EGD neg  Assessment & Plan:  54 year old s/p LVAD in 06/2019. Admitted 7/27 for VF requiring multiple DCCV and intubation. Failed extubation  8/6. Difficulty weaning due to agitation and weakness and may eventually require trach. Worsening respiratory status in the last 48 hours, now on low-dose pressors for septic shock secondary to presumed pneumonia. Previously on Zosyn>Ceftriaxone x 4d for Klebsiella however re-broadened to Meropenem for for new onset fevers.  Septic Shock 2/2 Klebsiella HAP Fever secondary to new infection vs drugs Antibiotics broadened to Meropenem 8/12. Day 3 Cardiology started anidulafungin 8/13. Day 1 Follow-up final cultures. BAL with 3K GNR   Acute Hypoxic Respiratory Failure: Intubated and re-intubated for recurrent VT/VF. Difficulty weaning d/t weakness and agitation RML endobronchial lesion visualized on 8/11, favor mixed mucous plug vs malignancy. Neg cytology Wean PSV as tolerated. Full vent support at night. Pending this weekend, patient will likely require tracheostomy. VAP Will need repeat bronchoscopy to re-evaluate endobronchial lesion when stable  Sedation Needs for Mechanical Ventilation  -PAD protocol with Precedex RASS Goal -1 , intermittent fentanyl and versed -Continue oral clonazepam 1 mg BID -Gabapentin q8h  Recurrent VFib/Vtach requiring repeated DCCV: No episodes since 8/6 -Cardiology / EP following  -On quinidine and amiodarone, EP following -Holding beta-blocker while on Neo-Synephrine -optimize electrolytes, goal K>4, Mg>2  Acute systolic heart failure s/p LVAD placement (06/25/19) -Diuresis held -Continue heparin per Cardiology  Coagulopathy, on warfarin  PTA -heparin per pharmacy, goal level 0.2-0.4, would hold warfarin in case  Tracheostomy required  Hypernatremia -Increase FWF  At Risk Malnutrition in setting of Critical Illness -continue TF   Best practice:  Diet: TF  Pain/Anxiety/Delirium protocol (if indicated): Precedex gt, PRN fentanyl and versed, PO klonipin VAP protocol (if indicated): yes DVT prophylaxis: heparin per pharmacy  GI prophylaxis: protonix    Glucose control: SSI Mobility: BR Code Status: Full  Family Communication: Per primary Disposition: ICU   The patient is critically ill with multiple organ systems failure and requires high complexity decision making for assessment and support, frequent evaluation and titration of therapies, application of advanced monitoring technologies and extensive interpretation of multiple databases.  Independent Critical Care Time: 43 Minutes.   Rodman Pickle, M.D. [facility] Pulmonary/Critical Care Medicine 08/15/2019 7:22 AM   Please see Amion for pager number to reach on-call Pulmonary and Critical Care Team.

## 2019-08-15 NOTE — Progress Notes (Addendum)
ANTICOAGULATION & Antibiotic CONSULT NOTE   Pharmacy Consult for Heparin, warfarin, Vancomycin, Meropenem Indication: LVAD  Allergies  Allergen Reactions  . Codeine Hives    Patient Measurements: Height: _0  (175.3 cm) Weight: 86.5 kg (190 lb 11.2 oz) IBW/kg (Calculated) : 70.7 Heparin Dosing Weight: ~ 92 kg  Vital Signs: Temp: 99.6 F (37.6 C) (08/14 1200) Temp Source: Oral (08/14 1200) BP: 83/66 (08/14 1200) Pulse Rate: 57 (08/14 1100)  Labs: Recent Labs    08/13/19 0446 08/13/19 0446 08/13/19 1126 08/14/19 0443 08/15/19 0319  HGB 9.8*   < >  --  9.3* 9.4*  HCT 34.6*  --   --  32.4* 33.3*  PLT 323  --   --  268 260  LABPROT 15.2  --   --  15.6* 15.0  INR 1.3*  --   --  1.3* 1.2  HEPARINUNFRC 0.13*   < > 0.26* 0.37 0.44  CREATININE 1.45*  --   --  1.58*  --    < > = values in this interval not displayed.    Estimated Creatinine Clearance: 57.5 mL/min (A) (by C-G formula based on SCr of 1.58 mg/dL (H)).  Assessment: 54 yo male s/p LVAD HM3 placement 6/24, followed by IABP 7/8-7/13. 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 IABP removed 8/5, heparin restarted for LVAD. Extubated and reintubated 8/6. Warfarin restarted on 8/12.  Heparin level is therapeutic at 0.44, on 1150 units/hr. INR 1.2. Hgb 9.4, plt 260. LDH 402. No infusion issues - no bleeding reported.    Antibiotics: Patient now growing coag-negative staph in BAL. Currently on meropenem for klebsiella pneumoniae in BAL and empiric Eraxis. Pharmacy asked to add vancomycin. WBC elevated at 14, febrile with Tm 102, Scr trending down to 1.4.  Goal of Therapy:  Heparin level goal 0.2-0.5 w/ LVAD Monitor platelets by anticoagulation protocol: Yes   Plan:  Continue heparin infusion at 1150 units/hr No warfarin today per MD Daily heparin level, CBC, INR Monitor for bleeding  Start Vancomycin 1784m IV once, then 10065mIV Q12  hrs Continue meropenem and Eraxis Monitor renal function, clinical progression Check vanc trough level at steady state  ChRichardine ServicePharmD PGY2 Cardiology Pharmacy Resident Phone: [phone]/14/2021  12:20 PM  Please check [email or web address] for unit-specific pharmacy phone numbers.

## 2019-08-16 LAB — GLUCOSE, CAPILLARY
Glucose-Capillary: 101 mg/dL — ABNORMAL HIGH (ref 70–99)
Glucose-Capillary: 118 mg/dL — ABNORMAL HIGH (ref 70–99)
Glucose-Capillary: 126 mg/dL — ABNORMAL HIGH (ref 70–99)
Glucose-Capillary: 130 mg/dL — ABNORMAL HIGH (ref 70–99)
Glucose-Capillary: 130 mg/dL — ABNORMAL HIGH (ref 70–99)
Glucose-Capillary: 95 mg/dL (ref 70–99)

## 2019-08-16 LAB — CBC
HCT: 32.6 % — ABNORMAL LOW (ref 39.0–52.0)
Hemoglobin: 9.1 g/dL — ABNORMAL LOW (ref 13.0–17.0)
MCH: 28 pg (ref 26.0–34.0)
MCHC: 27.9 g/dL — ABNORMAL LOW (ref 30.0–36.0)
MCV: 100.3 fL — ABNORMAL HIGH (ref 80.0–100.0)
Platelets: 214 10*3/uL (ref 150–400)
RBC: 3.25 MIL/uL — ABNORMAL LOW (ref 4.22–5.81)
RDW: 19.9 % — ABNORMAL HIGH (ref 11.5–15.5)
WBC: 13.1 10*3/uL — ABNORMAL HIGH (ref 4.0–10.5)
nRBC: 0 % (ref 0.0–0.2)

## 2019-08-16 LAB — BASIC METABOLIC PANEL WITH GFR
Anion gap: 9 (ref 5–15)
BUN: 49 mg/dL — ABNORMAL HIGH (ref 6–20)
CO2: 20 mmol/L — ABNORMAL LOW (ref 22–32)
Calcium: 8.2 mg/dL — ABNORMAL LOW (ref 8.9–10.3)
Chloride: 116 mmol/L — ABNORMAL HIGH (ref 98–111)
Creatinine, Ser: 1.31 mg/dL — ABNORMAL HIGH (ref 0.61–1.24)
GFR calc Af Amer: >60 mL/min
GFR calc non Af Amer: >60 mL/min
Glucose, Bld: 152 mg/dL — ABNORMAL HIGH (ref 70–99)
Potassium: 4.2 mmol/L (ref 3.5–5.1)
Sodium: 145 mmol/L (ref 135–145)

## 2019-08-16 LAB — LACTATE DEHYDROGENASE: LDH: 368 U/L — ABNORMAL HIGH (ref 98–192)

## 2019-08-16 LAB — COOXEMETRY PANEL
Carboxyhemoglobin: 1.7 % — ABNORMAL HIGH (ref 0.5–1.5)
Methemoglobin: 1 % (ref 0.0–1.5)
O2 Saturation: 63 %
Total hemoglobin: 9.6 g/dL — ABNORMAL LOW (ref 12.0–16.0)

## 2019-08-16 LAB — MAGNESIUM: Magnesium: 1.9 mg/dL (ref 1.7–2.4)

## 2019-08-16 LAB — HEPARIN LEVEL (UNFRACTIONATED): Heparin Unfractionated: 0.23 IU/mL — ABNORMAL LOW (ref 0.30–0.70)

## 2019-08-16 LAB — PROTIME-INR
INR: 1.3 — ABNORMAL HIGH (ref 0.8–1.2)
Prothrombin Time: 15.5 s — ABNORMAL HIGH (ref 11.4–15.2)

## 2019-08-16 MED ORDER — VASOPRESSIN 20 UNITS/100 ML INFUSION FOR SHOCK
0.0000 [IU]/min | INTRAVENOUS | Status: DC
  Administered 2019-08-16 – 2019-08-17 (×4): 0.03 [IU]/min via INTRAVENOUS
  Filled 2019-08-16 (×6): qty 100

## 2019-08-16 MED ORDER — DOCUSATE SODIUM 50 MG/5ML PO LIQD
100.0000 mg | Freq: Two times a day (BID) | ORAL | Status: DC
  Administered 2019-08-16 – 2019-08-18 (×3): 100 mg via ORAL
  Filled 2019-08-16 (×4): qty 10

## 2019-08-16 MED ORDER — FENTANYL 2500MCG IN NS 250ML (10MCG/ML) PREMIX INFUSION
50.0000 ug/h | INTRAVENOUS | Status: DC
  Administered 2019-08-17 (×2): 175 ug/h via INTRAVENOUS
  Administered 2019-08-18: 100 ug/h via INTRAVENOUS
  Administered 2019-08-19: 125 ug/h via INTRAVENOUS
  Administered 2019-08-20: 100 ug/h via INTRAVENOUS
  Filled 2019-08-16 (×5): qty 250

## 2019-08-16 MED ORDER — POLYETHYLENE GLYCOL 3350 17 G PO PACK: 17.0000 g | PACK | Freq: Every day | ORAL | Status: DC

## 2019-08-16 MED ORDER — LACTATED RINGERS IV BOLUS
500.0000 mL | Freq: Once | INTRAVENOUS | Status: AC
  Administered 2019-08-16: 500 mL via INTRAVENOUS

## 2019-08-16 MED ORDER — CHLORHEXIDINE GLUCONATE 0.12 % MT SOLN
OROMUCOSAL | Status: AC
  Administered 2019-08-16: 15 mL via OROMUCOSAL
  Filled 2019-08-16: qty 15

## 2019-08-16 MED ORDER — FENTANYL BOLUS VIA INFUSION
50.0000 ug | INTRAVENOUS | Status: DC | PRN
  Filled 2019-08-16: qty 50

## 2019-08-16 MED ORDER — FENTANYL CITRATE (PF) 100 MCG/2ML IJ SOLN
50.0000 ug | Freq: Once | INTRAMUSCULAR | Status: AC
  Administered 2019-08-16: 50 ug via INTRAVENOUS

## 2019-08-16 MED ORDER — MAGNESIUM SULFATE 2 GM/50ML IV SOLN
2.0000 g | Freq: Once | INTRAVENOUS | Status: AC
  Administered 2019-08-16: 2 g via INTRAVENOUS
  Filled 2019-08-16: qty 50

## 2019-08-16 NOTE — Progress Notes (Signed)
[place] for Heparin, warfarin Indication: LVAD  Allergies  Allergen Reactions  . Codeine Hives    Patient Measurements: Height: 5\' 9"  (175.3 cm) Weight: 92 kg (202 lb 13.2 oz) IBW/kg (Calculated) : 70.7 Heparin Dosing Weight: ~ 92 kg  Vital Signs: Temp: 100.4 F (38 C) (08/15 0734) Temp Source: Oral (08/15 0734) BP: 96/84 (08/15 0800) Pulse Rate: 56 (08/15 0735)  Labs: Recent Labs    08/14/19 0443 08/14/19 0443 08/15/19 0319 08/15/19 1213 08/16/19 0323  HGB 9.3*   < > 9.4*  --  9.1*  HCT 32.4*  --  33.3*  --  32.6*  PLT 268  --  260  --  214  LABPROT 15.6*  --  15.0  --  15.5*  INR 1.3*  --  1.2  --  1.3*  HEPARINUNFRC 0.37  --  0.44  --  0.23*  CREATININE 1.58*  --   --  1.40* 1.31*   < > = values in this interval not displayed.    Estimated Creatinine Clearance: 71.4 mL/min (A) (by C-G formula based on SCr of 1.31 mg/dL (H)).  Assessment: 54 yo male s/p LVAD HM3 placement 6/24, followed by IABP 7/8-7/13. 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 IABP removed 8/5, heparin restarted for LVAD. Extubated and reintubated 8/6. Warfarin restarted on 8/12.  Heparin level is therapeutic at 0.23 but down from 0.44 yesterday, on 1150 units/hr. He has been therapeutic on this drip rate for many days. RN does report that he has some bleeding from his mouth and with suctioning. INR 1.3. Hgb 9.1, plt 214. LDH 368. Renal function improving. No infusion issues.  Goal of Therapy:  Heparin level goal 0.2-0.5 w/ LVAD Monitor platelets by anticoagulation protocol: Yes   Plan:  Continue heparin infusion at 1150 units/hr No warfarin today per MD Daily heparin level, CBC, INR Monitor for bleeding  Richardine Service, PharmD PGY2 Cardiology Pharmacy Resident Phone: [phone] 08/16/2019  9:21 AM  Please check [email or web address] for unit-specific pharmacy phone numbers.

## 2019-08-16 NOTE — Progress Notes (Signed)
Patient ID: Aaron [name], male   DOB: 05/25/1965, 54 y.o.   MRN: [id number]   Advanced Heart Failure VAD Team Note  PCP-Cardiologist: No primary care provider on file.   Subjective:    - Had refractory VF throughout the night 7/28 w/ ~10 shocks. Now intubated.  - Developed bradycardia and further VF 7/30 am, TTVP + IABP placed 7/30 am.   - 8/1 completed cefepime, switched to ancef. Had VF 08/02/19 shock x1  - 8/3 Echo ramp, increased speed to 5400. VF x3. Failed IABP wean. Speed decreased to 5300.   - 08/05/19 VF shock x1 - 8/5 IABP removed.  VT shock x 3 when sedation weaned and patient agitated.  - 8/6 Extubated, VT with shock x 3, reintubated.  -8/8 Lidocaine stopped. Quinidine started  -8/11 EGD negative for bleed. + gastritis  -8/12 started on meropenum for persistent fevers and worsening leukocytosis  - 8/13 started Eraxis  Awake on vent.Follows commands   Remains febrile. Tm down 102.1 -> 100.4 . WBC 13.7 -> 14.1 -> 14.1 Remains on meropenem and Eraxis. Vanc added 8/14 for CNS in sputum  Has klebsiella/CNS in sputum. All other cx negative   VT/VF quiescent on quinidine,   Co-ox 63%. Off lasix. On free water for hypernatremia On Neo for low BP.   LDH 368   LVAD INTERROGATION:  HeartMate 3 LVAD:   Flow 4.3 liters/min, speed 5300, power 3.7, PI 2.7.  VAD interrogated personally. Parameters stable.  Objective:    Vital Signs:   Temp:  [97.7 F (36.5 C)-100.4 F (38 C)] 100.4 F (38 C) (08/15 0734) Pulse Rate:  [27-62] 47 (08/15 1000) Resp:  [24-34] 24 (08/15 1000) BP: (87-104)/(53-91) 96/81 (08/15 1000) SpO2:  [92 %-100 %] 94 % (08/15 1000) Arterial Line BP: (82-98)/(29-82) 86/69 (08/15 1000) FiO2 (%):  [40 %] 40 % (08/15 0735) Weight:  [92 kg] 92 kg (08/15 0500) Last BM Date: 08/15/19 Mean arterial Pressure 70s   Intake/Output:   Intake/Output Summary (Last 24 hours) at 08/16/2019 1206 Last data filed at 08/16/2019 1100 Gross per 24 hour  Intake 3775.03 ml    Output 3160 ml  Net 615.03 ml     Physical Exam   General:  Intubated, awake on vent, follows commands. No distress  HEENT: normal  + ETT Neck: supple. JVP to jaw Carotids 2+ bilat; no bruits. No lymphadenopathy or thryomegaly appreciated. Cor: LVAD hum.  Lungs: Coarse Abdomen: obese soft, nontender, non-distended. No hepatosplenomegaly. No bruits or masses. Good bowel sounds. Driveline site clean. Anchor in place.  Extremities: no cyanosis, clubbing, rash. Warm 2-3+ edema  Neuro: awake on vent follows commands   Telemetry   S brady/NSR 40-60s . No further VT/VF Personally reviewed  Labs   Basic Metabolic Panel: Recent Labs  Lab 08/09/2019 0321 08/04/2019 0321 08/13/19 0446 08/13/19 0446 08/14/19 0443 08/15/19 0319 08/15/19 1213 08/16/19 0323  NA 146*  --  151*  --  153*  --  150* 145  K 4.4  --  4.9  --  4.4  --  4.1 4.2  CL 110  --  114*  --  117*  --  120* 116*  CO2 27  --  25  --  25  --  20* 20*  GLUCOSE 192*  --  163*  --  151*  --  162* 152*  BUN 59*  --  54*  --  58*  --  52* 49*  CREATININE 1.34*  --  1.45*  --  1.58*  --  1.40* 1.31*  CALCIUM 8.2*   < > 8.5*   < > 8.5*  --  8.2* 8.2*  MG 2.2  --  1.9  --  2.0 2.1  --  1.9   < > = values in this interval not displayed.    Liver Function Tests: Recent Labs  Lab 08/10/19 0336 08/11/19 0359 08/26/2019 0321 08/13/19 0446 08/14/19 0443  AST 87* 78* 87* 121* 219*  ALT 47* 46* 50* 75* 144*  ALKPHOS 120 105 101 92 81  BILITOT 0.7 0.8 0.3 0.7 0.8  PROT 6.0* 5.8* 5.9* 6.4* 6.3*  ALBUMIN 2.0* 1.9* 1.9* 2.1* 2.2*   No results for input(s): LIPASE, AMYLASE in the last 168 hours. No results for input(s): AMMONIA in the last 168 hours.  CBC: Recent Labs  Lab 08/18/2019 0321 08/13/19 0446 08/14/19 0443 08/15/19 0319 08/16/19 0323  WBC 13.2* 15.2* 13.7* 14.1* 13.1*  HGB 9.0* 9.8* 9.3* 9.4* 9.1*  HCT 31.3* 34.6* 32.4* 33.3* 32.6*  MCV 96.3 101.8* 99.4 100.3* 100.3*  PLT 284 323 268 260 214     INR: Recent Labs  Lab 08/13/2019 0321 08/13/19 0446 08/14/19 0443 08/15/19 0319 08/16/19 0323  INR 1.2 1.3* 1.3* 1.2 1.3*    Other results:    Imaging   DG Chest Port 1 View  Result Date: 08/15/2019 CLINICAL DATA:  Acute respiratory failure EXAM: PORTABLE CHEST 1 VIEW COMPARISON:  08/08/2019 FINDINGS: Cardiac shadow remains enlarged. LVAD is again identified and stable. Postsurgical changes are noted. Endotracheal tube, gastric catheter and right-sided PICC line are again noted and stable. The lungs are well aerated bilaterally. Vascular congestion and mild pulmonary edema is again identified but slightly improved when compare with the prior exam. Increased right basilar opacity is again noted and stable. IMPRESSION: Slight improved aeration when compared with the prior exam. Electronically Signed   By: Inez Catalina M.D.   On: 08/15/2019 06:04     Medications:     Scheduled Medications:  alteplase  2 mg Intracatheter Once   aspirin  81 mg Per Tube Daily   atorvastatin  80 mg Per Tube Daily   bisacodyl  10 mg Rectal Once   chlorhexidine gluconate (MEDLINE KIT)  15 mL Mouth Rinse BID   Chlorhexidine Gluconate Cloth  6 each Topical Daily   clonazePAM  1 mg Per Tube BID   docusate  100 mg Oral BID   feeding supplement (PROSource TF)  90 mL Per Tube BID   fentaNYL (SUBLIMAZE) injection  50 mcg Intravenous Once   folic acid  1 mg Per Tube Daily   free water  300 mL Per Tube Q3H   gabapentin  300 mg Per Tube Q8H   insulin aspart  0-15 Units Subcutaneous Q4H   insulin aspart  3 Units Subcutaneous Q4H   mouth rinse  15 mL Mouth Rinse 10 times per day   midodrine  15 mg Per Tube TID WC   multivitamin  15 mL Per Tube Daily   oxybutynin  5 mg Oral BID   pantoprazole (PROTONIX) IV  40 mg Intravenous Q12H   polyethylene glycol  17 g Oral Daily   quiNIDine sulfate  150 mg Per Tube Q6H   sildenafil  20 mg Per Tube TID   sodium chloride flush  10-40 mL  Intracatheter Q12H   thiamine  100 mg Per Tube Daily    Infusions:  sodium chloride     sodium chloride 10 mL/hr at 08/03/2019 1100  sodium chloride 20 mL/hr at 08/01/19 1700   amiodarone 30 mg/hr (08/16/19 1100)   anidulafungin Stopped (08/15/19 1320)   dexmedetomidine (PRECEDEX) IV infusion 0.5 mcg/kg/hr (08/16/19 1100)   feeding supplement (VITAL 1.5 CAL) 1,000 mL (08/16/19 0130)   fentaNYL infusion INTRAVENOUS     heparin 1,150 Units/hr (08/16/19 1100)   lactated ringers     meropenem (MERREM) IV 1 g (08/16/19 0601)   phenylephrine (NEO-SYNEPHRINE) Adult infusion 25 mcg/min (08/16/19 1100)   vancomycin Stopped (08/16/19 0257)   vasopressin      PRN Medications: Place/Maintain arterial line **AND** sodium chloride, sodium chloride, acetaminophen, fentaNYL, midazolam, sodium chloride flush   Assessment/Plan:    1. Refractory Ventricular Fibrillation  -multiple episodes ~10 of VF on 7/28 - multiple episodes 7/30 am => IABP placed as well as TTVP given relative bradycardia.  IABP and TTVP now out.  Continued to have VF 7/30 , 7/31, 8/1, 8/3, 8/4, 8/6.    - Ranexa and Mexiletine currently on hold (unable to crush and administer through tube).  - Sotalol not a good option given his history of bradycardia.  - Lidocaine discontinued 8/8 - now on quinidine 150 mg q6h. No VT/VF since addition of quinidine. QT ok - Continue amiodarone gtt at 30 mg/hr.   - Keep K > 4.0 and Mg > 2.0. - Avoid inotropes   2. Acute Systolic Heart Failure s/p VAD - EF 20%, RV systolic function mildly reduced - HMIII LVAD placed 06/25/19.  - RV looks good on echo, cannula ok  - Co-ox 63% - avoid inotropes with VF, remains on midodrine 15 mg tid + low dose NEO for BP support. Will add VP in setting of possible sepsis - Off digoxin with bradycardia/AKI.  - Continue Heparin gtt, off warfarin currently for trach. LDH stable today at 368  - VAD interrogated personally. Parameters  stable. - Volume status up. Holding diuretics for now d/t possible sepsis. Having increasing edema.D/w CCM. Wil hold off on diuretics one more day. Likely start tomorrow    3. Acute hypoxic resp failure - intubated in setting of VF - failing vent wean. Will need trach when more stable from infection standpoint - vent management per PCCM.    4. CAD - S/p Acute STEMI 6/21.Emergent cath showed severe multivessel disease w/ 75% mid- distal LM, 100% prox LAD, not amendable to PCI (unable to cross w/ guide wire, probable CTO), 100% prox LCx (probable CTO) and 100% ostial RCA (probable CTO). There were mildto moderate collaterals right to left to the LAD, minimal right to right collateralsan minimalleft to left collaterals.LVEF 25% -Hs trop peaked 27,000. - No targets for CABG / PCI.  - S/P HMIII VAD - Continue ASA + statin  - No change  5. Renal mass = RCC - MRI abdomen- 1.8 cm right renal lesion, most consistent with renal cell carcinoma. No evidence of renal vein involvement or nodal metastasis. - Discussed w/ Urology and R RCC is small and will just require surveillance. Ok to proceed with VAD from their standpoint.   6. Urinary retention - continue abx - repeat urine culture 8/12 NGTD - currently w/ condom cath but requiring frequent I/O caths  7. ID:  - ?HCAP => Klebsiella in trach aspirate from 7/29.  Sputum now also growing CNS - Completed cefepime 08/02/19, switched to ancef - now on meropenum (started 8/12), Eraxis (start 8/13) and vanc (started 8/14) - Blood CX repeated 08/13/19, NGTD - Fever curve improving - continue current regimen  8. Anemia w/ Concern for GI Source  -mild rectal bleeding 8/8 and blood clot from mouth 8/10 - hgb stable at 9.1 - on heparin anticoagulation for LVAD - EGD 8/11 unremarkable. No bleeding  - transfuse if hgb drops <7. 0   9. AKI -overall improved (SCr peaked to 2.13)  - Cr 1.3 today  10 Hypernatremia - getting free water -  Na 145 today   CRITICAL CARE Performed by: Glori Bickers  Total critical care time: 35 minutes  Critical care time was exclusive of separately billable procedures and treating other patients.  Critical care was necessary to treat or prevent imminent or life-threatening deterioration.  Critical care was time spent personally by me (independent of midlevel providers or residents) on the following activities: development of treatment plan with patient and/or surrogate as well as nursing, discussions with consultants, evaluation of patient's response to treatment, examination of patient, obtaining history from patient or surrogate, ordering and performing treatments and interventions, ordering and review of laboratory studies, ordering and review of radiographic studies, pulse oximetry and re-evaluation of patient's condition.     Glori Bickers, MD 08/16/2019  12:06 PM

## 2019-08-16 NOTE — Progress Notes (Signed)
NAME:  Aaron [name], MRN:  [id number], DOB:  August 31, 1965, LOS: 44 ADMISSION DATE:  07/23/2019, CONSULTATION DATE:  07/30/19 REFERRING MD:  Heart Failure, CHIEF COMPLAINT:  Respiratory failure -- VF, LVAD   Brief History   54 yo PMH acute systolic heart failure s/p LVAD (06/25/19), recurrent VT/VF/Torsades, multivessel CAD, R renal mass c/f RCC, suspected OSA, who presents back to ICU after prolonged hospital course after which he was transferred to [facility] 7/23. At CIR, pt developed dyspnea, hypoxia following PT 7/27. ECG obtained and reveals VF rate 210 bpm. Transferred to CVICU 7/27 for emergent DCCV. Intubated by anesthesia. DCCV performed numerous times 7/27, 7/28. Remains intubated requiring frequent cardioversion.  PCCM consulted 7/29 for vent management   Past Medical History  Acute systolic heart failure s/p LVAD CAD Recurrent VF, VT, torsades  Renal mass Debility Sleep apnea   Significant Hospital Events   6/16 admitted with acute anterolateral STEMI, cardiogenic shock requiring IABP 6/25 LVAD placement 7/03 VT/VT/Torsades requiring multiple emergent DCCV  7/06 recurrent VT/VT/Torsades requiring multiple emergent DCCV  7/07 recurrent VT/VT/Torsades requiring multiple emergent DCCV  7/08 PNA, respiratory failure, required intubation 7/13 IABP removed, extubated 7/23 discharred to CIR 7/27 recurrent VF, admit to CVICU for DCCV. Intubated 7/28 recurrent VF, DCCV multiple times 7/29 PCCM consult for vent  7/30 vt overnight defib x2 7/31 DCCV x2, improved on versed / no further shocks 7/31>> present, intermittent vtach/shocks correlating with agitation 8/6 failed extubation trial 8/12 fever 8/13 Tolerated 2 hours trach collar however was re-sedated for reported anxiety and distress 8/14 Unable to tolerate PS due to tachypnea   Consults:  EP PCCM  Procedures:  ETT 7/27 >8/6> exchanged 8/11 (cuff leak ) >> Art line 7/28 >> RUE PICC 6/28 >>  R Femoral Venous Sheath 7/30 >>  out IABP 7/30 >> out  Significant Diagnostic Tests:  CT Chest 07/09/19 - 1. Interval placement LVAD with loculated appearing fluid in the anterior mediastinum and surrounding the outflow graft. This is nonspecific and although may represent postsurgical seroma, infection is not excluded. Clinical correlation is recommended. 2. Ill-defined faint high attenuating content above the aortic valve leaflets suboptimally characterized. Blood clot is not excluded. 3. Small bilateral pleural effusions and findings of asymmetric edema. Pneumonia is not excluded. 4. No bowel obstruction. Normal appendix. 5. A 4 mm nonobstructing right renal upper pole calculus. No hydronephrosis. 6. Aortic Atherosclerosis (ICD10-I70.0).  EGD 8/11 neg  Bscopy 8/11 >> for hemoptysis, mucus mixed with blood especially right-sided airways, stringy endobronchial mucoid masslike lesion right middle lobe, could not be fully suctioned out Micro Data:  resp 7/29 >> klebsiella resp 8/8 >>>klebs >> R to ampi/ unasyn BAL Cx 8/11>>3K colonies GNR, coag neg staph 8/12 blood >> 8/12 resp >>   Antimicrobials:  Cefepime 7/28-8/1 Ancef 8/1 -8/3 Zosyn 8/8 >> 8/11 ceftx 8/11 >> 8/12 Meropenem 8/12 >>  Interim history/subjective:  Remains on low-dose Neo.   Objective   Blood pressure 96/84, pulse (!) 56, temperature (!) 100.4 F (38 C), temperature source Oral, resp. rate (!) 34, height _0  (1.753 m), weight 92 kg, SpO2 100 %. CVP:  [8 mmHg-13 mmHg] 10 mmHg  Vent Mode: PRVC FiO2 (%):  [40 %] 40 % Set Rate:  [18 bmp] 18 bmp Vt Set:  [560 mL] 560 mL PEEP:  [5 cmH20] 5 cmH20 Plateau Pressure:  [18 cmH20-22 cmH20] 22 cmH20   Intake/Output Summary (Last 24 hours) at 08/16/2019 0954 Last data filed at 08/16/2019 0800 Gross per 24 hour  Intake 3828.18 ml  Output 3120 ml  Net 708.18 ml   Filed Weights   08/14/19 0500 08/15/19 0416 08/16/19 0500  Weight: 87 kg 86.5 kg 92 kg   Physical Exam: General: Appears older  than stated age, ill-appearing, no acute distress HENT: [place], AT, ETT in place Eyes: EOMI, no scleral icterus Respiratory: Diminshed breath sounds bilaterally.  No crackles, wheezing or rales Cardiovascular: LVAD hum GI: BS+, soft, nontender Extremities:1-2+ pitting edema in extremities Neuro: Awake, alert, follows commands, strength 1/5 in bilateral upper extremities, 5/5 in lower extremities  CBC Latest Ref Rng & Units 08/16/2019 08/15/2019 08/14/2019  WBC 4.0 - 10.5 K/uL 13.1(H) 14.1(H) 13.7(H)  Hemoglobin 13.0 - 17.0 g/dL 9.1(L) 9.4(L) 9.3(L)  Hematocrit 39 - 52 % 32.6(L) 33.3(L) 32.4(L)  Platelets 150 - 400 K/uL 214 260 268   CMP Latest Ref Rng & Units 08/16/2019 08/15/2019 08/14/2019  Glucose 70 - 99 mg/dL 152(H) 162(H) 151(H)  BUN 6 - 20 mg/dL 49(H) 52(H) 58(H)  Creatinine 0.61 - 1.24 mg/dL 1.31(H) 1.40(H) 1.58(H)  Sodium 135 - 145 mmol/L 145 150(H) 153(H)  Potassium 3.5 - 5.1 mmol/L 4.2 4.1 4.4  Chloride 98 - 111 mmol/L 116(H) 120(H) 117(H)  CO2 22 - 32 mmol/L 20(L) 20(L) 25  Calcium 8.9 - 10.3 mg/dL 8.2(L) 8.2(L) 8.5(L)  Total Protein 6.5 - 8.1 g/dL - - 6.3(L)  Total Bilirubin 0.3 - 1.2 mg/dL - - 0.8  Alkaline Phos 38 - 126 U/L - - 81  AST 15 - 41 U/L - - 219(H)  ALT 0 - 44 U/L - - 144(H)   SvO2 63%  Imaging, labs and test noted above have been reviewed independently by me.  Resolved Hospital Problem list   Sepsis due to UTI, Klebsiella Pneumoniae PNA-cefazolin, complete 7 days total (8/3 end date) Acute blood loss anemia - EGD neg  Assessment & Plan:  54 year old s/p LVAD in 06/2019. Admitted 7/27 for VF requiring multiple DCCV and intubation. Failed extubation 8/6. Difficulty weaning due to agitation and weakness and may eventually require trach. Worsening respiratory status in the last 48 hours, now on low-dose pressors for septic shock secondary to presumed pneumonia. Previously on Zosyn>Ceftriaxone x 4d for Klebsiella however re-broadened to Meropenem for for new onset  fevers.  Septic Shock 2/2 Klebsiella HAP and CoNStaph Fever secondary to new infection vs drugs Antibiotics broadened to Vanc and Meropenem  Maintain MAP >65. On Neo. Add Vasopressin Cardiology started anidulafungin 8/13. Day 2 Follow-up final cultures. BAL with Kleb pneumoniae and and CoNStaph  Acute Hypoxic Respiratory Failure: Intubated on 8/27 and re-intubated on 8/6 for failed extubation. Difficulty weaning d/t weakness and agitation Will need trach. Will plan when patient is no longer septic and clinically stable Wean PSV as tolerated. Full vent support at night. Pending this weekend, patient will likely require tracheostomy. VAP  RML endobronchial lesion visualized on 8/11, favor mixed mucous plug vs malignancy. Neg cytology Will need repeat bronchoscopy to re-evaluate endobronchial lesion when stable  Sedation Needs for Mechanical Ventilation  -PAD protocol RASS Goal -1 , intermittent fentanyl and versed. Start continuous Fentanyl gtt. DC Precedex for bradycardia and hypotension -Continue oral clonazepam 1 mg BID -Gabapentin q8h  Recurrent VFib/Vtach requiring repeated DCCV: No episodes since 8/6 -Cardiology / EP following  -On quinidine and amiodarone, EP following -Holding beta-blocker while on Neo-Synephrine -optimize electrolytes, goal K>4, Mg>2  Acute systolic heart failure s/p LVAD placement (06/25/19) -Diuresis held -Continue heparin per Cardiology  AKI, likely hypoperfuson from shock and/or  cardiorenal -Resume diuresis when pressures improve  Coagulopathy, on warfarin PTA -heparin per pharmacy, goal level 0.2-0.4, would hold warfarin in case  Tracheostomy required  Hypernatremia -Increase FWF  At Risk Malnutrition in setting of Critical Illness -continue TF   Best practice:  Diet: TF  Pain/Anxiety/Delirium protocol (if indicated): As above VAP protocol (if indicated): yes DVT prophylaxis: heparin per pharmacy  GI prophylaxis: protonix  Glucose control:  SSI Mobility: BR Code Status: Full  Family Communication: Per primary Disposition: ICU   The patient is critically ill with multiple organ systems failure and requires high complexity decision making for assessment and support, frequent evaluation and titration of therapies, application of advanced monitoring technologies and extensive interpretation of multiple databases.  Independent Critical Care Time: 3 Minutes.   Rodman Pickle, M.D. [facility] Pulmonary/Critical Care Medicine 08/16/2019 9:54 AM   Please see Amion for pager number to reach on-call Pulmonary and Critical Care Team.

## 2019-08-17 ENCOUNTER — Inpatient Hospital Stay (HOSPITAL_COMMUNITY): Payer: BC Managed Care – PPO

## 2019-08-17 ENCOUNTER — Encounter (HOSPITAL_COMMUNITY): Payer: Self-pay | Admitting: Gastroenterology

## 2019-08-17 LAB — BASIC METABOLIC PANEL
Anion gap: 11 (ref 5–15)
BUN: 45 mg/dL — ABNORMAL HIGH (ref 6–20)
CO2: 19 mmol/L — ABNORMAL LOW (ref 22–32)
Calcium: 8.2 mg/dL — ABNORMAL LOW (ref 8.9–10.3)
Chloride: 108 mmol/L (ref 98–111)
Creatinine, Ser: 1.23 mg/dL (ref 0.61–1.24)
GFR calc Af Amer: >60 mL/min (ref >60)
GFR calc non Af Amer: >60 mL/min (ref >60)
Glucose, Bld: 183 mg/dL — ABNORMAL HIGH (ref 70–99)
Potassium: 4.4 mmol/L (ref 3.5–5.1)
Sodium: 138 mmol/L (ref 135–145)

## 2019-08-17 LAB — MAGNESIUM: Magnesium: 2 mg/dL (ref 1.7–2.4)

## 2019-08-17 LAB — COOXEMETRY PANEL
Carboxyhemoglobin: 1.8 % — ABNORMAL HIGH (ref 0.5–1.5)
Methemoglobin: 1.1 % (ref 0.0–1.5)
O2 Saturation: 65.3 %
Total hemoglobin: 9.4 g/dL — ABNORMAL LOW (ref 12.0–16.0)

## 2019-08-17 LAB — PROTIME-INR
INR: 1.1 (ref 0.8–1.2)
Prothrombin Time: 14.2 seconds (ref 11.4–15.2)

## 2019-08-17 LAB — CBC
HCT: 31 % — ABNORMAL LOW (ref 39.0–52.0)
Hemoglobin: 8.7 g/dL — ABNORMAL LOW (ref 13.0–17.0)
MCH: 28.2 pg (ref 26.0–34.0)
MCHC: 28.1 g/dL — ABNORMAL LOW (ref 30.0–36.0)
MCV: 100.3 fL — ABNORMAL HIGH (ref 80.0–100.0)
Platelets: 171 10*3/uL (ref 150–400)
RBC: 3.09 MIL/uL — ABNORMAL LOW (ref 4.22–5.81)
RDW: 19.4 % — ABNORMAL HIGH (ref 11.5–15.5)
WBC: 11.7 10*3/uL — ABNORMAL HIGH (ref 4.0–10.5)
nRBC: 0 % (ref 0.0–0.2)

## 2019-08-17 LAB — GLUCOSE, CAPILLARY
Glucose-Capillary: 117 mg/dL — ABNORMAL HIGH (ref 70–99)
Glucose-Capillary: 121 mg/dL — ABNORMAL HIGH (ref 70–99)
Glucose-Capillary: 127 mg/dL — ABNORMAL HIGH (ref 70–99)
Glucose-Capillary: 133 mg/dL — ABNORMAL HIGH (ref 70–99)
Glucose-Capillary: 138 mg/dL — ABNORMAL HIGH (ref 70–99)
Glucose-Capillary: 143 mg/dL — ABNORMAL HIGH (ref 70–99)

## 2019-08-17 LAB — VANCOMYCIN, TROUGH: Vancomycin Tr: 27 ug/mL (ref 15–20)

## 2019-08-17 LAB — HEPARIN LEVEL (UNFRACTIONATED): Heparin Unfractionated: 0.22 IU/mL — ABNORMAL LOW (ref 0.30–0.70)

## 2019-08-17 LAB — LACTATE DEHYDROGENASE: LDH: 326 U/L — ABNORMAL HIGH (ref 98–192)

## 2019-08-17 MED ORDER — MIDAZOLAM HCL 2 MG/2ML IJ SOLN
5.0000 mg | Freq: Once | INTRAMUSCULAR | Status: AC
  Administered 2019-08-18: 4 mg via INTRAVENOUS
  Filled 2019-08-17: qty 6

## 2019-08-17 MED ORDER — ETOMIDATE 2 MG/ML IV SOLN
40.0000 mg | Freq: Once | INTRAVENOUS | Status: AC
  Administered 2019-08-18: 20 mg via INTRAVENOUS
  Filled 2019-08-17: qty 20

## 2019-08-17 MED ORDER — FUROSEMIDE 10 MG/ML IJ SOLN: 40.0000 mg | Freq: Once | INTRAMUSCULAR | Status: DC

## 2019-08-17 MED ORDER — FUROSEMIDE 10 MG/ML IJ SOLN
40.0000 mg | Freq: Two times a day (BID) | INTRAMUSCULAR | Status: DC
  Administered 2019-08-17 – 2019-08-18 (×3): 40 mg via INTRAVENOUS
  Filled 2019-08-17 (×3): qty 4

## 2019-08-17 MED ORDER — FENTANYL CITRATE (PF) 100 MCG/2ML IJ SOLN: 200.0000 ug | Freq: Once | INTRAMUSCULAR | Status: DC

## 2019-08-17 MED ORDER — VANCOMYCIN HCL 750 MG/150ML IV SOLN
750.0000 mg | Freq: Two times a day (BID) | INTRAVENOUS | Status: DC
  Administered 2019-08-17 – 2019-08-18 (×3): 750 mg via INTRAVENOUS
  Filled 2019-08-17 (×4): qty 150

## 2019-08-17 MED ORDER — VECURONIUM BROMIDE 10 MG IV SOLR
10.0000 mg | Freq: Once | INTRAVENOUS | Status: AC
  Administered 2019-08-18: 10 mg via INTRAVENOUS
  Filled 2019-08-17: qty 10

## 2019-08-17 NOTE — Progress Notes (Signed)
Pharmacy Antibiotic Note  Aaron [name] is a 54 y.o. male admitted on 07/23/2019 with pneumonia.  Pharmacy has been consulted for vancomycin, meropenem dosing. Currently on meropenem for klebsiella pneumoniae in BAL and empiric Eraxis. Vancomycin added for Coag neg staph in BAL. WBC elevated but improving, afebrile today, Scr trending down to 1.23  Vancomycin trough today = 27 on 1g q 12 hrs  Plan: 1. Decrease vancomycin to 750 mg q 12 hrs.  Planning total 14d course 2. Continue Meropenem 1g q 8 hrs (stop date in place of 8/22). 3. F/u cultures, renal function and clinical course.  Height: _0  (175.3 cm) Weight: 95.3 kg (210 lb 1.6 oz) IBW/kg (Calculated) : 70.7  Temp (24hrs), Avg:98.3 F (36.8 C), Min:97.7 F (36.5 C), Max:99 F (37.2 C)  Recent Labs  Lab 08/13/19 0446 08/14/19 0443 08/15/19 0319 08/15/19 1213 08/16/19 0323 08/17/19 0334 08/17/19 1300  WBC 15.2* 13.7* 14.1*  --  13.1* 11.7*  --   CREATININE 1.45* 1.58*  --  1.40* 1.31* 1.23  --   VANCOTROUGH  --   --   --   --   --   --  27*    Estimated Creatinine Clearance: 77.3 mL/min (by C-G formula based on SCr of 1.23 mg/dL).    Allergies  Allergen Reactions  . Codeine Hives    Antimicrobials this admission: Cefepime 7/28>>8/1 Vanc 7/28>7/29, 8/14 >> Cefazolin 8/1> 8/3 Zosyn 8/8>> 8/11 Ceftriaxone 8/11 x1 Meropenem 8/12>> Eraxis 8/13>>  Dose adjustments this admission: 8/16- VT = 27 on 1g q 12 hrs; dec to 750 q 12 hrs  Microbiology results: 7/28 bldx2 - neg 7/28 urine - ng 7/29 TA: kleb pneumo - S ancef 8/3 BCx x 2: neg 8/8 TA: rare GPC in pairs >> few Kleb pneumoniae (R amp/unasyn) 8/11 BAL: kleb pneumo + CoNS (S to only vanc + rifampin) 8/12 Bcx x2: ngtd 8/12 Ucx: ng  Thank you for allowing pharmacy to be a part of this patient's care.  [place] Crane, Roylene Reason, BCCP Clinical Pharmacist  08/17/2019 2:41 PM   [facility] pharmacy phone numbers are listed on [email or web address]

## 2019-08-17 NOTE — Progress Notes (Addendum)
NAME:  Aaron [name], MRN:  [id number], DOB:  Jun 06, 1965, LOS: 14 ADMISSION DATE:  07/27/2019, CONSULTATION DATE:  07/30/19 REFERRING MD:  Heart Failure, CHIEF COMPLAINT:  Respiratory failure -- VF, LVAD   Brief History   54 yo PMH acute systolic heart failure s/p LVAD (06/25/19), recurrent VT/VF/Torsades, multivessel CAD, R renal mass c/f RCC, suspected OSA, who presents back to ICU after prolonged hospital course after which he was transferred to [facility] 7/23. At CIR, pt developed dyspnea, hypoxia following PT 7/27. ECG obtained and reveals VF rate 210 bpm. Transferred to CVICU 7/27 for emergent DCCV. Intubated by anesthesia. DCCV performed numerous times 7/27, 7/28. Remains intubated requiring frequent cardioversion.  PCCM consulted 7/29 for vent management   Past Medical History  Acute systolic heart failure s/p LVAD CAD Recurrent VF, VT, torsades  Renal mass Debility Sleep apnea   Significant Hospital Events   6/16 admitted with acute anterolateral STEMI, cardiogenic shock requiring IABP 6/25 LVAD placement 7/03 VT/VT/Torsades requiring multiple emergent DCCV  7/06 recurrent VT/VT/Torsades requiring multiple emergent DCCV  7/07 recurrent VT/VT/Torsades requiring multiple emergent DCCV  7/08 PNA, respiratory failure, required intubation 7/13 IABP removed, extubated 7/23 discharred to CIR 7/27 recurrent VF, admit to CVICU for DCCV. Intubated 7/28 recurrent VF, DCCV multiple times 7/29 PCCM consult for vent  7/30 vt overnight defib x2 7/31 DCCV x2, improved on versed / no further shocks 7/31>> present, intermittent vtach/shocks correlating with agitation 8/6 failed extubation trial 8/12 fever 8/13 Tolerated 2 hours trach collar however was re-sedated for reported anxiety and distress 8/14 Unable to tolerate PS due to tachypnea  8/16: Off Neo-Synephrine. Fever curve fairly stable white blood cell count coming down volume overloaded. Resuming low-dose diuresis. Tolerating pressure  support of 10 however 2 weeks for extubation Consults:  EP PCCM  Procedures:  ETT 7/27 >8/6> exchanged 8/11 (cuff leak ) >> Art line 7/28 >> RUE PICC 6/28 >>  R Femoral Venous Sheath 7/30 >> out IABP 7/30 >> out  Significant Diagnostic Tests:  CT Chest 07/09/19 - 1. Interval placement LVAD with loculated appearing fluid in the anterior mediastinum and surrounding the outflow graft. This is nonspecific and although may represent postsurgical seroma, infection is not excluded. Clinical correlation is recommended. 2. Ill-defined faint high attenuating content above the aortic valve leaflets suboptimally characterized. Blood clot is not excluded. 3. Small bilateral pleural effusions and findings of asymmetric edema. Pneumonia is not excluded. 4. No bowel obstruction. Normal appendix. 5. A 4 mm nonobstructing right renal upper pole calculus. No hydronephrosis. 6. Aortic Atherosclerosis (ICD10-I70.0).  EGD 8/11 neg  Bscopy 8/11 >> for hemoptysis, mucus mixed with blood especially right-sided airways, stringy endobronchial mucoid masslike lesion right middle lobe, could not be fully suctioned out Micro Data:  resp 7/29 >> klebsiella resp 8/8 >>>klebs >> R to ampi/ unasyn BAL Cx 8/11>>3K Klebsiella, coag neg staph 8/12 blood >> 8/12 resp >>   Antimicrobials:  Cefepime 7/28-8/1 Ancef 8/1 -8/3 Zosyn 8/8 >> 8/11 ceftx 8/11 >> 8/12 Meropenem 8/12 >>  Interim history/subjective:  Improving  Objective   Blood pressure 104/90, pulse 79, temperature 98.4 F (36.9 C), temperature source Oral, resp. rate 15, height _0  (1.753 m), weight 95.3 kg, SpO2 98 %. CVP:  [8 mmHg-17 mmHg] 17 mmHg  Vent Mode: CPAP;PSV FiO2 (%):  [40 %] 40 % Set Rate:  [18 bmp] 18 bmp Vt Set:  [560 mL] 560 mL PEEP:  [5 cmH20] 5 cmH20 Pressure Support:  [10 cmH20] 10 cmH20 Plateau Pressure:  [  20 cmH20-26 cmH20] 26 cmH20   Intake/Output Summary (Last 24 hours) at 08/17/2019 0820 Last data filed at  08/17/2019 0600 Gross per 24 hour  Intake 3639.02 ml  Output 2115 ml  Net 1524.02 ml   Filed Weights   08/15/19 0416 08/16/19 0500 08/17/19 0500  Weight: 86.5 kg 92 kg 95.3 kg   Physical Exam: General 54 year old white male currently on pressure support ventilation he is very weak and debilitated, currently in no distress HEENT normocephalic atraumatic orally intubated. Does have some faint bloody secretions in his mouth. His sclera are slightly icteric, neck veins flat Cardiac current on LVAD Pulmonary diffuse scattered rhonchi. Currently pressure support of 10, PEEP of 5. Tidal volumes vary from 300-500 range. Does exhibit accessory use pulse oximetry in the mid nineties Extremities diffuse anasarca warm pulses palpable Abdomen soft not tender GU decreased urine output Neuro profoundly weak, barely able to lift extremities against gravity following commands, orientation unclear otherwise  Resolved Hospital Problem list   Sepsis due to UTI, Klebsiella Pneumoniae PNA-cefazolin, complete 7 days total (8/3 end date) Acute blood loss anemia - EGD neg  Assessment & Plan:  54 year old s/p LVAD in 06/2019. Admitted 7/27 for VF requiring multiple DCCV and intubation. Failed extubation 8/6. Difficulty weaning due to agitation and weakness and may eventually require trach. Worsening respiratory status in the last 48 hours, now on low-dose pressors for septic shock secondary to presumed pneumonia. Previously on Zosyn>Ceftriaxone x 4d for Klebsiella however re-broadened to Meropenem for for new onset fevers.    Acute Hypoxic Respiratory Failure: Intubated on 8/27 and re-intubated on 8/6 for failed extubation w/ course c/b Klebsiella and CoNStaph HCAP Pcxr: Last chest x-ray was obtained 9 8/14 endotracheal tube was in satisfactory position.  There is right greater than left airspace disease.  Cardiomegaly noted. Max temperature 100.4, white blood cell count trending down Currently essentially  euvolemic volume status Plan Day 5 meropenem, Day 3 vanc, Day 4 Eraxis  -->all cultures have been negative other than what is mentioned above. Based on what sensitivity data we have would be reasonable to narrow coverage to Vanc and CTX w/ plan to complete 7 total days. Will ck PCT and d/w primary team.  Repeat chest x-ray today Continue pressure support ventilation Agree with heart failure team reasonable to try diuresis VAP bundle PAD protocol, RASS goal 0 to -1 (intolerant of precedex d/t bradycardia and hypotension) Continue to trend fever and white blood cell curve I think he would benefit from tracheostomy we will discuss with Dr. Tamala Julian in regards to timing  RML endobronchial lesion visualized on 8/11, favor mixed mucous plug vs malignancy. Neg cytology Has history of renal mass, worrisome for right renal cell carcinoma Plan  We will eventually need repeat bronchoscopy to reevaluate endobronchial lesion  Continue observation in regards to renal cell mass  Acute systolic heart failure s/p LVAD placement (0/62/37); complicated by septic shock from HCAP -weaning pressors -now off Neo-->I think could come off vasopressin Plan Per heart failure team On midodrine 15 mg via tube 3 times daily, would dc vasopressin  Lasix X 1 Not a digoxin candidate given bradycardia Trying to avoid inotropes given difficulty with ventricular arrhythmias  Recurrent VFib/Vtach requiring repeated DCCV: No episodes since 8/6 Plan Quinidine and amiodarone per electrophysiology Holding beta-blockade given ongoing shock Potassium goal greater than 4 magnesium goal greater than 2 Continue telemetry monitoring Avoiding inotropic support given predisposition for ventricular arrhythmia   AKI, likely hypoperfuson from shock and/or cardiorenal,  serum creatinine has normalized/is normalizing Plan Daily assessment for diuresis Would continue daily chemistry monitoring especially with  diuresis   Coagulopathy, on warfarin PTA Plan Continue IV heparin, holding warfarin for now in case patient needs tracheostomy  Hypernatremia: now normalized Plan Cont current free water dosing   At Risk Malnutrition in setting of Critical Illness Plan tubefeeds  Best practice:  Diet: TF  Pain/Anxiety/Delirium protocol (if indicated): As above VAP protocol (if indicated): yes DVT prophylaxis: heparin per pharmacy  GI prophylaxis: protonix  Glucose control: SSI Mobility: BR Code Status: Full  Family Communication: Per primary Disposition: ICU   45 minutes critical care time  Erick Colace ACNP-BC [place] Pager # [phone] OR # [phone] if no answer

## 2019-08-17 NOTE — Progress Notes (Addendum)
Patient ID: Aaron [name], male   DOB: 1965-07-12, 54 y.o.   MRN: [id number]   Advanced Heart Failure VAD Team Note  PCP-Cardiologist: No primary care provider on file.   Subjective:    - Had refractory VF throughout the night 7/28 w/ ~10 shocks. Now intubated.  - Developed bradycardia and further VF 7/30 am, TTVP + IABP placed 7/30 am.   - 8/1 completed cefepime, switched to ancef. Had VF 08/02/19 shock x1  - 8/3 Echo ramp, increased speed to 5400. VF x3. Failed IABP wean. Speed decreased to 5300.   - 08/05/19 VF shock x1 - 8/5 IABP removed.  VT shock x 3 when sedation weaned and patient agitated.  - 8/6 Extubated, VT with shock x 3, reintubated.  -8/8 Lidocaine stopped. Quinidine started  -8/11 EGD negative for bleed. + gastritis  -8/12 started on meropenum for persistent fevers and worsening leukocytosis  - 8/13 started Eraxis  Awake on vent.   Remains on neo + vasopressin.  Remains febrile. Tm down 100.4 . WBC 11.7. Remains on meropenem +Eraxis+ vanc   Has klebsiella/CNS in sputum. All other cx negative   VT/VF quiescent on quinidine + amio drip.   Co-ox 65%. Off lasix. On free water for hypernatremia   LDH 326  LVAD INTERROGATION:  HeartMate 3 LVAD:   Flow 4.4 liters/min, speed 5300, power 4, PI 2.5.  No PI events. VAD interrogated personally. Parameters stable.  Objective:    Vital Signs:   Temp:  [97.7 F (36.5 C)-100.4 F (38 C)] 98 F (36.7 C) (08/16 0330) Pulse Rate:  [29-140] 71 (08/16 0630) Resp:  [6-34] 6 (08/16 0630) BP: (82-104)/(51-90) 99/83 (08/16 0630) SpO2:  [92 %-100 %] 98 % (08/16 0630) Arterial Line BP: (78-101)/(65-89) 100/88 (08/16 0630) FiO2 (%):  [40 %] 40 % (08/16 0407) Weight:  [95.3 kg] 95.3 kg (08/16 0500) Last BM Date: 08/16/19 Mean arterial Pressure 90s   Intake/Output:   Intake/Output Summary (Last 24 hours) at 08/17/2019 0647 Last data filed at 08/17/2019 0600 Gross per 24 hour  Intake 3758.18 ml  Output 2215 ml  Net 1543.18 ml      Physical Exam  CVP 15-16  Physical Exam: GENERAL: No acute distress. HEENT:ETT  NECK: Supple, JVP difficult to assess .  2+ bilaterally, no bruits.  No lymphadenopathy or thyromegaly appreciated.   CARDIAC:  Mechanical heart sounds with LVAD hum present.  LUNGS:  Rhonchi throughout.  ABDOMEN:  Soft, round, nontender, positive bowel sounds x4.     LVAD exit site:   Dressing dry and intact.  No erythema or drainage.  Stabilization device present and accurately applied.  Driveline dressing is being changed daily per sterile technique. EXTREMITIES:  Warm and dry, no cyanosis, clubbing, rash. R and LLE 1-2+ edema  NEUROLOGIC:  Awake on vent. MAE x4.       Telemetry    SR 60-70s No VT.   Labs   Basic Metabolic Panel: Recent Labs  Lab 08/13/19 0446 08/13/19 0446 08/14/19 0443 08/14/19 0443 08/15/19 0319 08/15/19 1213 08/16/19 0323 08/17/19 0334  NA 151*  --  153*  --   --  150* 145 138  K 4.9  --  4.4  --   --  4.1 4.2 4.4  CL 114*  --  117*  --   --  120* 116* 108  CO2 25  --  25  --   --  20* 20* 19*  GLUCOSE 163*  --  151*  --   --  162* 152* 183*  BUN 54*  --  58*  --   --  52* 49* 45*  CREATININE 1.45*  --  1.58*  --   --  1.40* 1.31* 1.23  CALCIUM 8.5*   < > 8.5*   < >  --  8.2* 8.2* 8.2*  MG 1.9  --  2.0  --  2.1  --  1.9 2.0   < > = values in this interval not displayed.    Liver Function Tests: Recent Labs  Lab 08/11/19 0359 08/16/2019 0321 08/13/19 0446 08/14/19 0443  AST 78* 87* 121* 219*  ALT 46* 50* 75* 144*  ALKPHOS 105 101 92 81  BILITOT 0.8 0.3 0.7 0.8  PROT 5.8* 5.9* 6.4* 6.3*  ALBUMIN 1.9* 1.9* 2.1* 2.2*   No results for input(s): LIPASE, AMYLASE in the last 168 hours. No results for input(s): AMMONIA in the last 168 hours.  CBC: Recent Labs  Lab 08/13/19 0446 08/14/19 0443 08/15/19 0319 08/16/19 0323 08/17/19 0334  WBC 15.2* 13.7* 14.1* 13.1* 11.7*  HGB 9.8* 9.3* 9.4* 9.1* 8.7*  HCT 34.6* 32.4* 33.3* 32.6* 31.0*  MCV 101.8* 99.4  100.3* 100.3* 100.3*  PLT 323 268 260 214 171    INR: Recent Labs  Lab 08/13/19 0446 08/14/19 0443 08/15/19 0319 08/16/19 0323 08/17/19 0334  INR 1.3* 1.3* 1.2 1.3* 1.1    Other results:    Imaging   No results found.   Medications:     Scheduled Medications: . alteplase  2 mg Intracatheter Once  . aspirin  81 mg Per Tube Daily  . atorvastatin  80 mg Per Tube Daily  . bisacodyl  10 mg Rectal Once  . chlorhexidine gluconate (MEDLINE KIT)  15 mL Mouth Rinse BID  . Chlorhexidine Gluconate Cloth  6 each Topical Daily  . clonazePAM  1 mg Per Tube BID  . docusate  100 mg Oral BID  . feeding supplement (PROSource TF)  90 mL Per Tube BID  . folic acid  1 mg Per Tube Daily  . free water  300 mL Per Tube Q3H  . gabapentin  300 mg Per Tube Q8H  . insulin aspart  0-15 Units Subcutaneous Q4H  . insulin aspart  3 Units Subcutaneous Q4H  . mouth rinse  15 mL Mouth Rinse 10 times per day  . midodrine  15 mg Per Tube TID WC  . multivitamin  15 mL Per Tube Daily  . oxybutynin  5 mg Oral BID  . pantoprazole (PROTONIX) IV  40 mg Intravenous Q12H  . polyethylene glycol  17 g Oral Daily  . quiNIDine sulfate  150 mg Per Tube Q6H  . sildenafil  20 mg Per Tube TID  . sodium chloride flush  10-40 mL Intracatheter Q12H  . thiamine  100 mg Per Tube Daily    Infusions: . sodium chloride    . sodium chloride 10 mL/hr at 08/15/2019 1100  . sodium chloride 20 mL/hr at 08/01/19 1700  . amiodarone 30 mg/hr (08/17/19 0600)  . anidulafungin Stopped (08/16/19 1417)  . dexmedetomidine (PRECEDEX) IV infusion Stopped (08/16/19 1145)  . feeding supplement (VITAL 1.5 CAL) 65 mL/hr at 08/17/19 0500  . fentaNYL infusion INTRAVENOUS 175 mcg/hr (08/17/19 0600)  . heparin 1,150 Units/hr (08/17/19 0600)  . meropenem (MERREM) IV Stopped (08/17/19 0539)  . phenylephrine (NEO-SYNEPHRINE) Adult infusion Stopped (08/17/19 0252)  . vancomycin Stopped (08/17/19 0322)  . vasopressin 0.03 Units/min (08/17/19  0600)    PRN Medications: Place/Maintain arterial  line **AND** sodium chloride, sodium chloride, acetaminophen, fentaNYL, midazolam, sodium chloride flush   Assessment/Plan:    1. Refractory Ventricular Fibrillation  -multiple episodes ~10 of VF on 7/28 - multiple episodes 7/30 am => IABP placed as well as TTVP given relative bradycardia.  IABP and TTVP now out.  Continued to have VF 7/30 , 7/31, 8/1, 8/3, 8/4, 8/6.    - Ranexa and Mexiletine currently on hold (unable to crush and administer through tube).  - Sotalol not a good option given his history of bradycardia.  - Lidocaine discontinued 8/8 - now on quinidine 150 mg q6h. No VT/VF since addition of quinidine. QT ok - Continue amiodarone gtt at 30 mg/hr.   - Keep K > 4.0 and Mg > 2.0. - Avoid inotropes   2. Acute Systolic Heart Failure s/p VAD - EF 18%, RV systolic function mildly reduced - HMIII LVAD placed 06/25/19.  - RV looks good on echo, cannula ok  - Co-ox 65%  - avoid inotropes with VF, remains on midodrine 15 mg tid +VP+ neo in setting of possible sepsis. Hopefully can wean.  - Off digoxin with bradycardia/AKI.  - Continue Heparin gtt, off warfarin currently for trach. LDH stable today at 326  - VAD interrogated personally. Parameters stable. - CVP trending up 15-16. Diuretics have been on hold due to possible sepsis.  - Will need restart lasix today if ok with CCM.    3. Acute hypoxic resp failure - intubated in setting of VF - failing vent wean. Will need trach when more stable from infection standpoint - vent management per PCCM.    4. CAD - S/p Acute STEMI 6/21.Emergent cath showed severe multivessel disease w/ 75% mid- distal LM, 100% prox LAD, not amendable to PCI (unable to cross w/ guide wire, probable CTO), 100% prox LCx (probable CTO) and 100% ostial RCA (probable CTO). There were mildto moderate collaterals right to left to the LAD, minimal right to right collateralsan minimalleft to left  collaterals.LVEF 25% -Hs trop peaked 27,000. - No targets for CABG / PCI.  - S/P HMIII VAD - Continue ASA + statin  - No change  5. Renal mass = RCC - MRI abdomen- 1.8 cm right renal lesion, most consistent with renal cell carcinoma. No evidence of renal vein involvement or nodal metastasis. - Discussed w/ Urology and R RCC is small and will just require surveillance. Ok to proceed with VAD from their standpoint.   6. Urinary retention - continue abx - repeat urine culture 8/12 NGTD - currently w/ condom cath but requiring frequent I/O caths  7. ID:  - ?HCAP => Klebsiella in trach aspirate from 7/29.  Sputum now also growing CNS - Completed cefepime 08/02/19, switched to ancef - now on meropenum (started 8/12), Eraxis (start 8/13) and vanc (started 8/14) - Blood CX repeated 08/13/19, NGTD - Fever curve improving - continue current regimen   8. Anemia w/ Concern for GI Source  -mild rectal bleeding 8/8 and blood clot from mouth 8/10 - hgb stable at 8.7  - on heparin anticoagulation for LVAD - EGD 8/11 unremarkable. No bleeding  - transfuse if hgb drops <7. 0   9. AKI -overall improved (SCr peaked to 2.13)  - Cr 1.23 today   10 Hypernatremia - getting free water - Na down to 138   of radiographic studies, pulse oximetry and re-evaluation of patient's condition.   Darrick Grinder, NP-C  08/17/2019  6:47 AM    Agree with above.  VT/VF quiescent on quinidine. Remains on neo and VP for PNA/sepsis. Volume status way up. VAD parameters stable.   General:  Awake on vent HEENT: normal  Neck: supple. JVP elevated Carotids 2+ bilat; no bruits. No lymphadenopathy or thryomegaly appreciated. Cor: LVAD hum.  Lungs: Coarse Abdomen: obese soft, nontender, non-distended. No hepatosplenomegaly. No bruits or masses. Good bowel sounds. Driveline site clean. Anchor in place.  Extremities: no cyanosis, clubbing, rash. Warm 3+ edema  Neuro: alert & oriented x 3. No focal deficits.  Moves all 4 without problem   VT/VF quiescent. Continue quinidine. Continue abx for PNA/sepsis. Seems to be headed in the right direction. Wean neo and then VP as tolerated. Begin diuresis. Plan trach likely tomorrow. Continue heparin. Hold warfarin until after trach.   CRITICAL CARE Performed by: Glori Bickers  Total critical care time: 35 minutes  Critical care time was exclusive of separately billable procedures and treating other patients.  Critical care was necessary to treat or prevent imminent or life-threatening deterioration.  Critical care was time spent personally by me (independent of midlevel providers or residents) on the following activities: development of treatment plan with patient and/or surrogate as well as nursing, discussions with consultants, evaluation of patient's response to treatment, examination of patient, obtaining history from patient or surrogate, ordering and performing treatments and interventions, ordering and review of laboratory studies, ordering and review of radiographic studies, pulse oximetry and re-evaluation of patient's condition.  Glori Bickers, MD  7:44 AM

## 2019-08-17 NOTE — Progress Notes (Signed)
CSW visited bedside although patient was sedated and intubated. No family present at the time of visit. CSW continues to be available as needed.  Raquel Sarna, [place], [place]

## 2019-08-17 NOTE — Progress Notes (Signed)
[facility] for Heparin, warfarin (hold) Indication: LVAD  Allergies  Allergen Reactions   Codeine Hives    Patient Measurements: Height: 5\' 9"  (175.3 cm) Weight: 95.3 kg (210 lb 1.6 oz) IBW/kg (Calculated) : 70.7 Heparin Dosing Weight: ~ 92 kg  Vital Signs: Temp: 98.3 F (36.8 C) (08/16 1200) Temp Source: Other (Comment) (08/16 1200) BP: 84/57 (08/16 1400) Pulse Rate: 75 (08/16 1400)  Labs: Recent Labs    08/15/19 0319 08/15/19 0319 08/15/19 1213 08/16/19 0323 08/17/19 0334  HGB 9.4*   < >  --  9.1* 8.7*  HCT 33.3*  --   --  32.6* 31.0*  PLT 260  --   --  214 171  LABPROT 15.0  --   --  15.5* 14.2  INR 1.2  --   --  1.3* 1.1  HEPARINUNFRC 0.44  --   --  0.23* 0.22*  CREATININE  --   --  1.40* 1.31* 1.23   < > = values in this interval not displayed.    Estimated Creatinine Clearance: 77.3 mL/min (by C-G formula based on SCr of 1.23 mg/dL).  Assessment: 54 yo male s/p LVAD HM3 placement 6/24, followed by IABP 7/8-7/13. 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 IABP removed 8/5, heparin restarted for LVAD. Extubated and reintubated 8/6. Warfarin restarted on 8/12.  Heparin level is therapeutic at 0.22 on 1150 units/hr. He has been therapeutic on this drip rate for many days. RN does report that he has some bleeding from his mouth and with suctioning. INR 1.3. Hgb 8.7, plt 171. LDH 326. Renal function improving. No infusion issues.  Goal of Therapy:  Heparin level goal 0.2-0.5 w/ LVAD Monitor platelets by anticoagulation protocol: Yes   Plan:  Continue heparin infusion at 1150 units/hr - will hold at 0900 on 8/17. No warfarin today per MD- holding for trach 8/17. Daily heparin level, CBC, INR Monitor for bleeding  [place] Crane, Roylene Reason, BCCP Clinical Pharmacist  08/17/2019 2:34 PM   [facility] pharmacy phone numbers are listed on [email or web address]

## 2019-08-17 NOTE — Progress Notes (Signed)
Orthopedic Tech Progress Note Patient Details:  Aaron [name] [birth year] [id number]  Ortho Devices Type of Ortho Device: Haematologist Ortho Device/Splint Location: BLE Ortho Device/Splint Interventions: Ordered, Application   Post Interventions Patient Tolerated: Well Instructions Provided: Care of device   Aaron [name] 08/17/2019, 11:55 AM

## 2019-08-17 NOTE — Progress Notes (Signed)
CSW visited bedside and patient is intubated. No family at bedside. Raquel Sarna, [place], [place]

## 2019-08-17 NOTE — Procedures (Signed)
Cortrak  Person Inserting Tube:  Maylon Peppers C, RD Tube Type:  Cortrak - 43 inches Tube Location:  Left nare Initial Placement:  Stomach Secured by: Bridle Technique Used to Measure Tube Placement:  Documented cm marking at nare/ corner of mouth Cortrak Secured At:  72 cm    Cortrak Tube Team Note:  Consult received to place a Cortrak feeding tube.   X-ray is required. Pt has LVAD. Abdominal x-ray has been ordered by the Cortrak team. Please confirm tube placement before using the Cortrak tube.   If the tube becomes dislodged please keep the tube and contact the Cortrak team at [email or web address] ([email or web address]) for replacement.  If after hours and replacement cannot be delayed, place a NG tube and confirm placement with an abdominal x-ray.    Lockie Pares., RD, LDN, CNSC See AMiON for contact information

## 2019-08-17 NOTE — Progress Notes (Addendum)
LVAD Coordinator Rounding Note:  HM III LVAD implanted on 06/25/19 by Dr. Orvan Seen under Destination Therapy criteria due to recent smoking history.  Pt's transferred to CIR on 07/24/19. Had refractory VF 07/30/2019 and transferred back to [place]. IABP and temporary pacer placed 07/22/2019. Had frequent recurrent VT/VF. IABP and temp pacer d/c 08/04/19. Extubated 8/6. VT x 3, required reintubation. Passed blood clot from mouth & rectum; EGD completed 08/17/2019 with no signs of bleeding; gastritis noted. Poor tolerance of SBTs.     Pt remains intubated, on Fentanyl drip. VT/VF quiescent on quinidine. Remains on neo and VP for PNA/sepsis. Volume status way up. VAD parameters stable.   Vital signs: Temp: 98.4 HR: 79 Art. Line: 105/80 (57) Cuff BP: 89/76 (83) Doppler: 100 O2 Sat: 99% on 40% FiO2 5  peep rate 18 TV 560 Wt: 189.6>196.4>200.8>209>....2>211.8>208>199.9>194.4>194.0>191.8>210lbs  LVAD parameters: Speed:  5300 Flow: 4.4 Power:  3.8 w PI: 2.9 Hct: 31  Alarms: none Events: none  Fixed speed: 5300 Low speed limit: 5000    Drive Line:  Existing VAD dressing removed and site care performed using sterile technique. Drive line exit site cleaned with Chlora prep applicators x 2, allowed to dry, and gauze dressing withOUT silver strip applied. Exit site healed and unincorporated, the velour is fully implanted at exit site. Scant amount of yellow drainage. No redness, tenderness, foul odor or rash noted. Drive line anchor secure. Next dressing change due Thursday: 08-30-19.     Labs:  LDH trend: 326>250>...>234>235>238>247>402>326  INR trend: 2.1>3.5>2.2>1.5>1.5>1.3>1.3>1.3>1.2>1.2>1.2>1.3>1.1  WBC trend: 14.0>11.1>11.4>12.0>13.2>13.7 >11.8>11.7  Anticoagulation Plan: -INR Goal:  2.0 - 2.5 -ASA Dose: 81 mg - will keep going for CAD for 3 mths stop date 09/25/19  Device: N/A  IABP: - placed 07/04/19 - dc'd 08/06/19  Drips: - Amiodarone 30 mg/hr - Heparin 1150 mg/hr  - Neo 15 mcg/kg/min -  Fentanyl 100 mcg/hr - Tube feed 65 ml/hr - Vasopressin 0.03 u/hr   Infection: - 07/25/2019 - BCs>>negative - 07/28/2019 - urine culture>>negative - 07/30/19 - respiratory culture>>Klebsiella pneumoniae - 08/04/19 - BCs>> NGTD final -08/09/19- respiratory culture>> Klebsiella pneumoniae  Arrythmias:  - 07/16/2019 Vfib while in CIR; emergently transferred back to 2H>> DCCV back to NSR; Amio gtt - 07/03/2019 recurrent VF>>increased amio gtt; Lido bolus/gtt; DC-CV 200J to NSR - 07/25/2019 recurrent VF>>8 separate DC-CV throughout night; required pressors for BP support and re-intubation - 07/08/2019 recurrent VT/VF>>7 separate DC-CV throughout am; IABP and pacer urgently placed in Cath Lab - 07/11/2019 recurrent VT/VF>>2 additional DC-CV after IABP placment - 08/01/19 recurrent VT/VF>> pt had 3 episodes of VT/VR with 4 DC-CV - 08/02/19 recurrent VT/VF>>one DC-CV to SR - 08/04/19 recurrent VT/VF>>5 DC-CV to SR - 08/05/19 recurrent VT/VF>> one DC-CV to SR - 08/06/19 recurrent VT/VF>> episode required 3 DC-CV to SR - 08/07/19 recurrent VT/VF>> 3 DCCV to SR  Respiratory:  - intubated 07/21/2019 - possible trach placement tomorrow  Plan/Recommendations:  1. Call VAD pager if any VAD equipment or drive line issues. 2. Dressing changes Monday/Thursday  Tanda Rockers RN [place] Coordinator  Office: [phone]  24/7 Pager: [phone]

## 2019-08-18 ENCOUNTER — Inpatient Hospital Stay (HOSPITAL_COMMUNITY): Payer: BC Managed Care – PPO

## 2019-08-18 LAB — CBC
HCT: 31.3 % — ABNORMAL LOW (ref 39.0–52.0)
Hemoglobin: 8.7 g/dL — ABNORMAL LOW (ref 13.0–17.0)
MCH: 28.4 pg (ref 26.0–34.0)
MCHC: 27.8 g/dL — ABNORMAL LOW (ref 30.0–36.0)
MCV: 102.3 fL — ABNORMAL HIGH (ref 80.0–100.0)
Platelets: 164 10*3/uL (ref 150–400)
RBC: 3.06 MIL/uL — ABNORMAL LOW (ref 4.22–5.81)
RDW: 19.1 % — ABNORMAL HIGH (ref 11.5–15.5)
WBC: 12.7 10*3/uL — ABNORMAL HIGH (ref 4.0–10.5)
nRBC: 0.2 % (ref 0.0–0.2)

## 2019-08-18 LAB — GLUCOSE, CAPILLARY
Glucose-Capillary: 104 mg/dL — ABNORMAL HIGH (ref 70–99)
Glucose-Capillary: 112 mg/dL — ABNORMAL HIGH (ref 70–99)
Glucose-Capillary: 124 mg/dL — ABNORMAL HIGH (ref 70–99)
Glucose-Capillary: 133 mg/dL — ABNORMAL HIGH (ref 70–99)
Glucose-Capillary: 166 mg/dL — ABNORMAL HIGH (ref 70–99)
Glucose-Capillary: 92 mg/dL (ref 70–99)

## 2019-08-18 LAB — PROTIME-INR
INR: 1.3 — ABNORMAL HIGH (ref 0.8–1.2)
Prothrombin Time: 15.4 seconds — ABNORMAL HIGH (ref 11.4–15.2)

## 2019-08-18 LAB — MAGNESIUM: Magnesium: 2.3 mg/dL (ref 1.7–2.4)

## 2019-08-18 LAB — LACTATE DEHYDROGENASE: LDH: 351 U/L — ABNORMAL HIGH (ref 98–192)

## 2019-08-18 LAB — BASIC METABOLIC PANEL
Anion gap: 11 (ref 5–15)
BUN: 54 mg/dL — ABNORMAL HIGH (ref 6–20)
CO2: 18 mmol/L — ABNORMAL LOW (ref 22–32)
Calcium: 8.3 mg/dL — ABNORMAL LOW (ref 8.9–10.3)
Chloride: 110 mmol/L (ref 98–111)
Creatinine, Ser: 1.53 mg/dL — ABNORMAL HIGH (ref 0.61–1.24)
GFR calc Af Amer: 58 mL/min — ABNORMAL LOW (ref >60)
GFR calc non Af Amer: 50 mL/min — ABNORMAL LOW (ref >60)
Glucose, Bld: 192 mg/dL — ABNORMAL HIGH (ref 70–99)
Potassium: 4.7 mmol/L (ref 3.5–5.1)
Sodium: 139 mmol/L (ref 135–145)

## 2019-08-18 LAB — CULTURE, BLOOD (ROUTINE X 2)
Culture: NO GROWTH
Culture: NO GROWTH
Special Requests: ADEQUATE
Special Requests: ADEQUATE

## 2019-08-18 LAB — COOXEMETRY PANEL
Carboxyhemoglobin: 1.6 % — ABNORMAL HIGH (ref 0.5–1.5)
Methemoglobin: 1 % (ref 0.0–1.5)
O2 Saturation: 67.6 %
Total hemoglobin: 15.1 g/dL (ref 12.0–16.0)

## 2019-08-18 LAB — HEPARIN LEVEL (UNFRACTIONATED)
Heparin Unfractionated: 0.22 IU/mL — ABNORMAL LOW (ref 0.30–0.70)
Heparin Unfractionated: 0.13 IU/mL — ABNORMAL LOW (ref 0.30–0.70)

## 2019-08-18 MED ORDER — FREE WATER
300.0000 mL | Freq: Four times a day (QID) | Status: DC
  Administered 2019-08-18: 300 mL

## 2019-08-18 MED ORDER — WARFARIN SODIUM 2.5 MG PO TABS
2.5000 mg | ORAL_TABLET | Freq: Once | ORAL | Status: AC
  Administered 2019-08-18: 2.5 mg via ORAL
  Filled 2019-08-18: qty 1

## 2019-08-18 MED ORDER — FUROSEMIDE 10 MG/ML IJ SOLN
80.0000 mg | Freq: Two times a day (BID) | INTRAMUSCULAR | Status: DC
  Administered 2019-08-18: 80 mg via INTRAVENOUS
  Filled 2019-08-18: qty 8

## 2019-08-18 MED ORDER — HEPARIN (PORCINE) 25000 UT/250ML-% IV SOLN
1350.0000 [IU]/h | INTRAVENOUS | Status: DC
  Administered 2019-08-19: 1350 [IU]/h via INTRAVENOUS
  Filled 2019-08-18 (×2): qty 250

## 2019-08-18 MED ORDER — VASOPRESSIN 20 UNITS/100 ML INFUSION FOR SHOCK
0.0000 [IU]/min | INTRAVENOUS | Status: DC
  Filled 2019-08-18: qty 100

## 2019-08-18 MED ORDER — WARFARIN - PHARMACIST DOSING INPATIENT: Freq: Every day | Status: DC

## 2019-08-18 MED ORDER — METOLAZONE 2.5 MG PO TABS
2.5000 mg | ORAL_TABLET | Freq: Every day | ORAL | Status: DC
  Administered 2019-08-18: 2.5 mg via ORAL
  Filled 2019-08-18: qty 1

## 2019-08-18 NOTE — Progress Notes (Signed)
[facility] for Heparin Indication: LVAD  Allergies  Allergen Reactions  . Codeine Hives    Patient Measurements: Height: 5\' 9"  (175.3 cm) Weight: 98.8 kg (217 lb 13 oz) IBW/kg (Calculated) : 70.7 Heparin Dosing Weight: ~ 92 kg  Vital Signs: Temp: 98.7 F (37.1 C) (08/17 1503) Temp Source: Oral (08/17 1503) BP: 82/68 (08/17 1700) Pulse Rate: 72 (08/17 1800)  Labs: Recent Labs    08/16/19 0323 08/16/19 0323 08/17/19 0334 08/18/19 0423 08/18/19 1844  HGB 9.1*   < > 8.7* 8.7*  --   HCT 32.6*  --  31.0* 31.3*  --   PLT 214  --  171 164  --   LABPROT 15.5*  --  14.2 15.4*  --   INR 1.3*  --  1.1 1.3*  --   HEPARINUNFRC 0.23*   < > 0.22* 0.22* 0.13*  CREATININE 1.31*  --  1.23 1.53*  --    < > = values in this interval not displayed.    Estimated Creatinine Clearance: 63.2 mL/min (A) (by C-G formula based on SCr of 1.53 mg/dL (H)).  Assessment: 54 yo male s/p LVAD HM3 placement 6/24, followed by IABP 7/8-7/13. 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 IABP removed 8/5, heparin restarted for LVAD. Extubated and reintubated 8/6. Warfarin restarted on 8/12.  Heparin level is subtherapeutic at 0.13 after restart this afternoon. Warfarin resumed earlier tonight.  Goal of Therapy:  Heparin level goal 0.2-0.5 w/ LVAD Monitor platelets by anticoagulation protocol: Yes  INR 2-2.5   Plan:  -Increase heparin to 1250 units/h -Recheck heparin level with daily labs   Arrie Senate, PharmD, BCPS Clinical Pharmacist [phone] Please check AMION for all [facility] numbers 08/18/2019

## 2019-08-18 NOTE — Progress Notes (Signed)
CSW visited bedside with no family present at the time of visit. Patient trached today and continues on ventilator. CSW continues to follow for support as needed.  Raquel Sarna, [place], [place]

## 2019-08-18 NOTE — Progress Notes (Signed)
NAME:  KASHAWN DIRR, MRN:  [id number], DOB:  November 01, 1965, LOS: 21 ADMISSION DATE:  07/14/2019, CONSULTATION DATE:  07/30/19 REFERRING MD:  Heart Failure, CHIEF COMPLAINT:  Respiratory failure -- VF, LVAD   Brief History   54 yo PMH acute systolic heart failure s/p LVAD (06/25/19), recurrent VT/VF/Torsades, multivessel CAD, R renal mass c/f RCC, suspected OSA, who presents back to ICU after prolonged hospital course after which he was transferred to [facility] 7/23. At CIR, pt developed dyspnea, hypoxia following PT 7/27. ECG obtained and reveals VF rate 210 bpm. Transferred to CVICU 7/27 for emergent DCCV. Intubated by anesthesia. DCCV performed numerous times 7/27, 7/28. Remains intubated requiring frequent cardioversion.  PCCM consulted 7/29 for vent management   Past Medical History  Acute systolic heart failure s/p LVAD CAD Recurrent VF, VT, torsades  Renal mass Debility Sleep apnea   Significant Hospital Events   6/16 admitted with acute anterolateral STEMI, cardiogenic shock requiring IABP 6/25 LVAD placement 7/03 VT/VT/Torsades requiring multiple emergent DCCV  7/06 recurrent VT/VT/Torsades requiring multiple emergent DCCV  7/07 recurrent VT/VT/Torsades requiring multiple emergent DCCV  7/08 PNA, respiratory failure, required intubation 7/13 IABP removed, extubated 7/23 discharred to CIR 7/27 recurrent VF, admit to CVICU for DCCV. Intubated 7/28 recurrent VF, DCCV multiple times 7/29 PCCM consult for vent  7/30 vt overnight defib x2 7/31 DCCV x2, improved on versed / no further shocks 7/31>> present, intermittent vtach/shocks correlating with agitation 8/6 failed extubation trial 8/12 fever 8/13 Tolerated 2 hours trach collar however was re-sedated for reported anxiety and distress 8/14 Unable to tolerate PS due to tachypnea  8/16: Off Neo-Synephrine. Fever curve fairly stable white blood cell count coming down volume overloaded. Resuming low-dose diuresis. Tolerating pressure  support of 10 however 2 weeks for extubation Consults:  EP PCCM  Procedures:  ETT 7/27 >8/6> exchanged 8/11 (cuff leak ) >> Art line 7/28 >> RUE PICC 6/28 >>  R Femoral Venous Sheath 7/30 >> out IABP 7/30 >> out  Significant Diagnostic Tests:  CT Chest 07/09/19 - 1. Interval placement LVAD with loculated appearing fluid in the anterior mediastinum and surrounding the outflow graft. This is nonspecific and although may represent postsurgical seroma, infection is not excluded. Clinical correlation is recommended. 2. Ill-defined faint high attenuating content above the aortic valve leaflets suboptimally characterized. Blood clot is not excluded. 3. Small bilateral pleural effusions and findings of asymmetric edema. Pneumonia is not excluded. 4. No bowel obstruction. Normal appendix. 5. A 4 mm nonobstructing right renal upper pole calculus. No hydronephrosis. 6. Aortic Atherosclerosis (ICD10-I70.0).  EGD 8/11 neg  Bscopy 8/11 >> for hemoptysis, mucus mixed with blood especially right-sided airways, stringy endobronchial mucoid masslike lesion right middle lobe, could not be fully suctioned out Micro Data:  resp 7/29 >> klebsiella resp 8/8 >>>klebs >> R to ampi/ unasyn BAL Cx 8/11>>3K Klebsiella, coag neg staph 8/12 blood >> 8/12 resp >>   Antimicrobials:  Cefepime 7/28-8/1 Ancef 8/1 -8/3 Zosyn 8/8 >> 8/11 ceftx 8/11 >> 8/12 Meropenem 8/12 >>  Interim history/subjective:  Improving  Objective   Blood pressure 90/86, pulse 80, temperature 98.1 F (36.7 C), temperature source Oral, resp. rate (Abnormal) 23, height _0  (1.753 m), weight 98.8 kg, SpO2 99 %. CVP:  [14 mmHg-20 mmHg] 14 mmHg  Vent Mode: PRVC FiO2 (%):  [40 %] 40 % Set Rate:  [18 bmp] 18 bmp Vt Set:  [560 mL] 560 mL PEEP:  [5 cmH20] 5 cmH20 Plateau Pressure:  [12 cmH20-26 cmH20] 12 cmH20  Intake/Output Summary (Last 24 hours) at 08/18/2019 0932 Last data filed at 08/18/2019 0600 Gross per 24 hour    Intake 2914.67 ml  Output 1630 ml  Net 1284.67 ml   Filed Weights   08/16/19 0500 08/17/19 0500 08/18/19 0500  Weight: 92 kg 95.3 kg 98.8 kg   Physical Exam: General this is a 54 year old white male he is currently on pressure support ventilation but very debilitated HEENT normocephalic atraumatic no jugular venous distention appreciated orally intubated Pulmonary: Scattered rhonchi equal chest rise no accessory use currently on full ventilatory support Cardiac regular rate and rhythm Abdomen soft nontender Extremities are notable for diffuse anasarca pulses palpable Neuro awake, anxious, moves all extremities but very very weak GU clear yellow  Resolved Hospital Problem list   Sepsis due to UTI, Klebsiella Pneumoniae PNA-cefazolin, complete 7 days total (8/3 end date) Acute blood loss anemia - EGD neg  Assessment & Plan:  54 year old s/p LVAD in 06/2019. Admitted 7/27 for VF requiring multiple DCCV and intubation. Failed extubation 8/6. Difficulty weaning due to agitation and weakness and may eventually require trach. Worsening respiratory status in the last 48 hours, now on low-dose pressors for septic shock secondary to presumed pneumonia. Previously on Zosyn>Ceftriaxone x 4d for Klebsiella however re-broadened to Meropenem for for new onset fevers.    Acute Hypoxic Respiratory Failure: Intubated on 8/27 and re-intubated on 8/6 for failed extubation w/ course c/b Klebsiella and CoNStaph HCAP major barrier to extubation at this point is profound deconditioning following prolonged critical illness as well as element of delirium Plan Day #6 of meropenem, day #4 of vancomycin.  We will complete 10 days total therapy  Eraxis was discontinued yesterday on 8/16  Continue pressure support ventilation as tolerated, however he will need tracheostomy, this is scheduled for later today  Repeat chest x-ray a.m.  PAD protocol RASS goal 0-1 (he note he is intolerant of Precedex due to  bradycardia)  Continue VAP bundle    RML endobronchial lesion visualized on 8/11, favor mixed mucous plug vs malignancy. Neg cytology Has history of renal mass, worrisome for right renal cell carcinoma Plan  We will reevaluate right middle lobe during tracheostomy today, would not proceed with any sort of biopsy however if this was mucous plugging hopefully will be gone  Observation in regards to renal cell mass   Acute metabolic encephalopathy Plan Currently on fentanyl infusion, scheduled clonazepam taper as needed Versed. Following tracheostomy we will need to assess for tapering off fentanyl.  Suspect he will need slow taper from this  Acute systolic heart failure s/p LVAD placement (2/68/34); complicated by septic shock from HCAP Now off all pressors Plan Continue midodrine 15 mg via tube 3 times a day  Discontinue vasopressin  Additional Per heart failure team recommendations/interventions per cardiology  Recurrent VFib/Vtach requiring repeated DCCV: No episodes since 8/6 Plan Continue quinidine and amiodarone per electrophysiology, I wonder if the amiodarone needs to be continued Holding beta-blockade in the direction of heart failure team Ensure potassium greater than 4 and magnesium greater than 2  Continue telemetry monitoring  Holding inotropic agents  Watching QTC    AKI, likely hypoperfuson from shock and/or cardiorenal, serum creatinine had normalized, however bumped with diuretics  Plan Daily assessment for diuresis, would hold off today given bump in BUN/creatinine   Coagulopathy, on warfarin PTA Plan We will hold IV heparin this morning planning on tracheostomy eventually we can resume warfarin or oral therapy as directed by primary team  Hypernatremia:  now normalized Plan Continue current free water dosing, this is been adjusted by her primary team  At Risk Malnutrition in setting of Critical Illness Plan He will need prolonged tube feeds and likely  PEG tube  Best practice:  Diet: TF  Pain/Anxiety/Delirium protocol (if indicated): As above VAP protocol (if indicated): yes DVT prophylaxis: heparin per pharmacy  GI prophylaxis: protonix  Glucose control: SSI Mobility: BR Code Status: Full  Family Communication: Per primary Disposition: ICU    cct 34 minutes  Erick Colace ACNP-BC [place] Pager # [phone] OR # [phone] if no answer

## 2019-08-18 NOTE — Progress Notes (Signed)
[facility] for Heparin, warfarin (hold) Indication: LVAD  Allergies  Allergen Reactions   Codeine Hives    Patient Measurements: Height: 5\' 9"  (175.3 cm) Weight: 98.8 kg (217 lb 13 oz) IBW/kg (Calculated) : 70.7 Heparin Dosing Weight: ~ 92 kg  Vital Signs: Temp: 98.7 F (37.1 C) (08/17 0000) Temp Source: Oral (08/17 0000) BP: 97/81 (08/17 0630) Pulse Rate: 78 (08/17 0630)  Labs: Recent Labs    08/16/19 0323 08/16/19 0323 08/17/19 0334 08/18/19 0423  HGB 9.1*   < > 8.7* 8.7*  HCT 32.6*  --  31.0* 31.3*  PLT 214  --  171 164  LABPROT 15.5*  --  14.2 15.4*  INR 1.3*  --  1.1 1.3*  HEPARINUNFRC 0.23*  --  0.22* 0.22*  CREATININE 1.31*  --  1.23 1.53*   < > = values in this interval not displayed.    Estimated Creatinine Clearance: 63.2 mL/min (A) (by C-G formula based on SCr of 1.53 mg/dL (H)).  Assessment: 54 yo male s/p LVAD HM3 placement 6/24, followed by IABP 7/8-7/13. 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 IABP removed 8/5, heparin restarted for LVAD. Extubated and reintubated 8/6. Warfarin restarted on 8/12.  Heparin level is therapeutic at 0.22 on 1150 units/hr. He has been therapeutic on this drip rate for many days. .CBC, INR, LDH stable. No infusion issues.  Goal of Therapy:  Heparin level goal 0.2-0.5 w/ LVAD Monitor platelets by anticoagulation protocol: Yes   Plan:  Continue heparin infusion at 1150 units/hr - will hold at 0900 today for trach. F/u plans to resume heparin/Coumadin after trach. Daily heparin level, CBC, INR Monitor for bleeding  [place] Crane, [place]ena [place] BCPS, BCCP Clinical Pharmacist  08/18/2019 7:30 AM   [facility] pharmacy phone numbers are listed on [email or web address]

## 2019-08-18 NOTE — Procedures (Signed)
Percutaneous Tracheostomy Procedure Note   Aaron [name]  [id number]  February 26, 1965  Date:08/18/19  Time:11:06 AM   Provider Performing:Ketty Bitton Gilford Rile  Procedure: Percutaneous Tracheostomy with Bronchoscopic Guidance (31600)  Indication(s) Prolonged Mechanical Ventilation   Consent Risks of the procedure as well as the alternatives and risks of each were explained to the patient and/or caregiver.  Consent for the procedure was obtained.  Anesthesia Etomidate, Versed, Fentanyl, Vecuronium   Time Out Verified patient identification, verified procedure, site/side was marked, verified correct patient position, special equipment/implants available, medications/allergies/relevant history reviewed, required imaging and test results available.   Sterile Technique Maximal sterile technique including sterile barrier drape, hand hygiene, sterile gown, sterile gloves, mask, hair covering.    Procedure Description Appropriate anatomy identified by palpation.  Patient's neck prepped and draped in sterile fashion.  1% lidocaine with epinephrine was used to anesthetize skin overlying neck.  1.5cm incision made and blunt dissection performed until tracheal rings could be easily palpated.   Then a size 6 Shiley tracheostomy was placed under bronchoscopic visualization using usual Seldinger technique and serial dilation.   Bronchoscope confirmed placement above the carina.  Tracheostomy was sutured in place with adhesive pad to protect skin under pressure.    Patient connected to ventilator.   Complications/Tolerance None; patient tolerated the procedure well. Chest X-ray is ordered to confirm no post-procedural complication.   EBL Minimal   Specimen(s) None

## 2019-08-18 NOTE — Procedures (Signed)
Diagnostic Bronchoscopy  ANTHONE PRIEUR  [id number]  Feb 18, 1965  Date:08/18/19  Time:12:53 PM   Provider Performing:Altovise Wahler C Tamala Julian   Procedure: Diagnostic Bronchoscopy (02637)  Indication(s) Assist with direct visualization of tracheostomy placement  Consent Risks of the procedure as well as the alternatives and risks of each were explained to the patient and/or caregiver.  Consent for the procedure was obtained.   Anesthesia See separate tracheostomy note   Time Out Verified patient identification, verified procedure, site/side was marked, verified correct patient position, special equipment/implants available, medications/allergies/relevant history reviewed, required imaging and test results available.   Sterile Technique Usual hand hygiene, masks, gowns, and gloves were used   Procedure Description Bronchoscope advanced through endotracheal tube and into airway.  After suctioning out tracheal secretions, bronchoscope used to provide direct visualization of tracheostomy placement.  Following tracheostomy placement, RML inspected: clear as were all bronchi on R down to subsegmental level, suspect prior RML abnormality was a blood clot which has since been cleared.   Complications/Tolerance None; patient tolerated the procedure well.   EBL None  Specimen(s) None

## 2019-08-18 NOTE — Procedures (Signed)
Diagnostic Bronchoscopy  STROTHER EVERITT  [id number]  1965-09-21  Date:08/18/19  Time:1:26 PM   Provider Performing:Pete E Kary Kos   Procedure: Diagnostic Bronchoscopy (33832)  Indication(s) Assist with direct visualization of tracheostomy placement  Consent Risks of the procedure as well as the alternatives and risks of each were explained to the patient and/or caregiver.  Consent for the procedure was obtained.   Anesthesia See separate tracheostomy note   Time Out Verified patient identification, verified procedure, site/side was marked, verified correct patient position, special equipment/implants available, medications/allergies/relevant history reviewed, required imaging and test results available.   Sterile Technique Usual hand hygiene, masks, gowns, and gloves were used   Procedure Description Bronchoscope advanced through endotracheal tube and into airway.  After suctioning out tracheal secretions, bronchoscope used to provide direct visualization of tracheostomy placement.   Complications/Tolerance None; patient tolerated the procedure well.   EBL None  Specimen(s) None  Erick Colace ACNP-BC [place] Pager # [phone] OR # [phone] if no answer

## 2019-08-18 NOTE — Progress Notes (Signed)
LVAD Coordinator Rounding Note:  HM III LVAD implanted on 06/25/19 by Dr. Orvan Seen under Destination Therapy criteria due to recent smoking history.  Pt's transferred to CIR on 07/24/19. Had refractory VF 07/10/2019 and transferred back to [place]. IABP and temporary pacer placed 07/22/2019. Had frequent recurrent VT/VF. IABP and temp pacer d/c 08/04/19. Extubated 8/6. VT x 3, required reintubation. Passed blood clot from mouth & rectum; EGD completed 08/11/2019 with no signs of bleeding; gastritis noted. Poor tolerance of SBTs.     Awake on vent. Yesterday diuresed with IV lasix. Remains positive.    Afebrile. WBC 12.7. Remains on meropenem +Eraxis+ vanc   Has klebsiella/CNS in sputum. All other cx negative   VT/VF quiescent on quinidine + amio drip.   Vital signs: Temp: 98.1 HR: 80 Art. Line: 73/69 (71) Cuff BP: 90/86  Doppler: 92 O2 Sat: 99% on 40% FiO2 5  peep rate 18 TV 560 Wt: 189.6>196.4>200.8>209>....2>211.8>208>199.9>194.4>194.0>191.8>210>217.8lbs  LVAD parameters: Speed:  5300 Flow: 4.4 Power:  3.7 w PI: 2.7 Hct: 31  Alarms: none Events: none  Fixed speed: 5300 Low speed limit: 5000    Drive Line:  CDI. Next dressing change due Thursday: 09-05-2019.     Labs:  LDH trend: 326>250>...>234>235>238>247>402>326>351  INR trend: 2.1>3.5>2.2>1.5>1.5>1.3>1.3>1.3>1.2>1.2>1.2>1.3>1.1>1.3  WBC trend: 14.0>11.1>11.4>12.0>13.2>13.7 >11.8>11.7>12.7  Anticoagulation Plan: -INR Goal:  2.0 - 2.5 -ASA Dose: 81 mg - will keep going for CAD for 3 mths stop date 09/25/19  Device: N/A  IABP: - placed 07/04/19 - dc'd 08/06/19  Drips: - Amiodarone 30 mg/hr - Heparin 1150 mg/hr  - Neo 10 mcg/kg/min - Fentanyl 100 mcg/hr - Tube feed 65 ml/hr-holding for trach today - Vasopressin 0.03 u/hr-off today  Infection: - 07/25/2019 - BCs>>negative - 07/27/2019 - urine culture>>negative - 07/30/19 - respiratory culture>>Klebsiella pneumoniae - 08/04/19 - BCs>> NGTD final -08/09/19- respiratory culture>>  Klebsiella pneumoniae  Arrythmias:  - 07/18/2019 Vfib while in CIR; emergently transferred back to 2H>> DCCV back to NSR; Amio gtt - 07/07/2019 recurrent VF>>increased amio gtt; Lido bolus/gtt; DC-CV 200J to NSR - 07/08/2019 recurrent VF>>8 separate DC-CV throughout night; required pressors for BP support and re-intubation - 07/22/2019 recurrent VT/VF>>7 separate DC-CV throughout am; IABP and pacer urgently placed in Cath Lab - 07/27/2019 recurrent VT/VF>>2 additional DC-CV after IABP placment - 08/01/19 recurrent VT/VF>> pt had 3 episodes of VT/VR with 4 DC-CV - 08/02/19 recurrent VT/VF>>one DC-CV to SR - 08/04/19 recurrent VT/VF>>5 DC-CV to SR - 08/05/19 recurrent VT/VF>> one DC-CV to SR - 08/06/19 recurrent VT/VF>> episode required 3 DC-CV to SR - 08/07/19 recurrent VT/VF>> 3 DCCV to SR  Respiratory:  - intubated 07/13/2019 - trach placement today  Plan/Recommendations:  1. Call VAD pager if any VAD equipment or drive line issues. 2. Dressing changes Monday/Thursday  Tanda Rockers RN [place] Coordinator  Office: [phone]  24/7 Pager: [phone]

## 2019-08-18 NOTE — Progress Notes (Signed)
Nutrition Follow-up  DOCUMENTATION CODES:   Severe malnutrition in context of acute illness/injury  INTERVENTION:   Recommend consideration of PEG tube placement for long term nutrition support  Tube Feeding:  Continue Vital 1.5 at 65 ml/hr via Cortrak Pro-Source TF 90 mL BID Provides 2500 kcals, 149 g of protein, 1186 mL of free water Meets 100% estimated calorie and protein needs   NUTRITION DIAGNOSIS:   Severe Malnutrition related to acute illness (CHF with HM3 implantation) as evidenced by moderate fat depletion, moderate muscle depletion, energy intake < or equal to 50% for > or equal to 5 days.  Being addressed via TF   GOAL:   Patient will meet greater than or equal to 90% of their needs  Progressing  MONITOR:   Diet advancement, Vent status, Skin, TF tolerance, Weight trends, Labs, I & O's  REASON FOR ASSESSMENT:   Ventilator    ASSESSMENT:   Patient with PMH significant for MI, CAD, HTN, ischemic cardiomyopathy, CHF Recently admitted with acute STEMI, required multiple DCCV, IABP, and subsequent VAD for cardiogenic shock (HM3 placed 6/24). Presents from rehab with VF and readmitted to ICU.  7/30 IABP placed 8/05 IABP removed, VT shock x3 8/06 extubated, VT shock x3, reintubated  8/11 EGD: gastritis, normal duodenum 8/16 Cortrak placed 8/17 Diagnostic Bronch, Trach  Pt remains on vent support, trach today. Sedated on fentanyl gtt; off vasopressin, started on phenylephrine  Tolerating Vital 1.5 at 65 ml/hr, 90 mL Pro-Source TF BID via OG, Free water 300 mL   No pressure injuries indicate  Current weight 98.8 kg; Lowest weight 86 kg. Net negative 3.7 L  Labs: reviewed Meds:  Lasix, folic acid, ss novolog, novolog q 4 hours, miralax, thiamine    Diet Order:   Diet Order            Diet NPO time specified  Diet effective midnight                 EDUCATION NEEDS:   Not appropriate for education at this time  Skin:  Skin Assessment: Skin  Integrity Issues: Skin Integrity Issues:: Incisions Incisions: chest, abdomen  Last BM:  8/16 rectal tube  Height:   Ht Readings from Last 1 Encounters:  08/01/19 5\' 9"  (1.753 m)    Weight:   Wt Readings from Last 1 Encounters:  08/18/19 98.8 kg    BMI:  Body mass index is 32.17 kg/m.  Estimated Nutritional Needs:   Kcal:  2150-2580 kcal  Protein:  130-172 grams  Fluid:  >/= 2.1 L/day   Kerman Passey MS, RDN, LDN, CNSC Registered Dietitian III Clinical Nutrition RD Pager and On-Call Pager Number Located in [place]

## 2019-08-18 NOTE — Progress Notes (Addendum)
Electrophysiology Rounding Note  Patient Name: Aaron [name] Date of Encounter: 08/18/2019  Primary Cardiologist: Dr. Haroldine [name]  Electrophysiologist:  Dr. Rayann [name]  Subjective   Remains intubated, awake.   Remains on vasopressin and meropenem + eraxis + vanc.   Inpatient Medications    Scheduled Meds: . alteplase  2 mg Intracatheter Once  . aspirin  81 mg Per Tube Daily  . atorvastatin  80 mg Per Tube Daily  . bisacodyl  10 mg Rectal Once  . chlorhexidine gluconate (MEDLINE KIT)  15 mL Mouth Rinse BID  . Chlorhexidine Gluconate Cloth  6 each Topical Daily  . clonazePAM  1 mg Per Tube BID  . docusate  100 mg Oral BID  . etomidate  40 mg Intravenous Once  . feeding supplement (PROSource TF)  90 mL Per Tube BID  . fentaNYL (SUBLIMAZE) injection  200 mcg Intravenous Once  . folic acid  1 mg Per Tube Daily  . free water  300 mL Per Tube Q6H  . furosemide  40 mg Intravenous BID  . gabapentin  300 mg Per Tube Q8H  . insulin aspart  0-15 Units Subcutaneous Q4H  . insulin aspart  3 Units Subcutaneous Q4H  . mouth rinse  15 mL Mouth Rinse 10 times per day  . midazolam  5 mg Intravenous Once  . midodrine  15 mg Per Tube TID WC  . multivitamin  15 mL Per Tube Daily  . oxybutynin  5 mg Oral BID  . pantoprazole (PROTONIX) IV  40 mg Intravenous Q12H  . polyethylene glycol  17 g Oral Daily  . quiNIDine sulfate  150 mg Per Tube Q6H  . sildenafil  20 mg Per Tube TID  . sodium chloride flush  10-40 mL Intracatheter Q12H  . thiamine  100 mg Per Tube Daily  . vecuronium  10 mg Intravenous Once   Continuous Infusions: . sodium chloride    . sodium chloride 10 mL/hr at 08/09/2019 1100  . sodium chloride 20 mL/hr at 08/01/19 1700  . amiodarone 30 mg/hr (08/18/19 0600)  . dexmedetomidine (PRECEDEX) IV infusion Stopped (08/16/19 1145)  . feeding supplement (VITAL 1.5 CAL) 1,000 mL (08/17/19 1246)  . fentaNYL infusion INTRAVENOUS 175 mcg/hr (08/18/19 0600)  . heparin 1,150 Units/hr  (08/18/19 0600)  . meropenem (MERREM) IV Stopped (08/18/19 0548)  . phenylephrine (NEO-SYNEPHRINE) Adult infusion Stopped (08/17/19 1818)  . vancomycin Stopped (08/17/19 2339)   PRN Meds: Place/Maintain arterial line **AND** sodium chloride, sodium chloride, acetaminophen, fentaNYL, midazolam, sodium chloride flush   Vital Signs    Vitals:   08/18/19 0600 08/18/19 0630 08/18/19 0733 08/18/19 0746  BP: 92/77 97/81  90/86  Pulse: 74 78  80  Resp: 13 19  (!) 23  Temp:   98.1 F (36.7 C)   TempSrc:   Oral   SpO2: 99% 98%  99%  Weight:      Height:        Intake/Output Summary (Last 24 hours) at 08/18/2019 0845 Last data filed at 08/18/2019 0600 Gross per 24 hour  Intake 2962.2 ml  Output 1630 ml  Net 1332.2 ml   Filed Weights   08/16/19 0500 08/17/19 0500 08/18/19 0500  Weight: 92 kg 95.3 kg 98.8 kg    Physical Exam    GEN- The patient is intubated. Responds to voice.  HEENT- + ET tube.  Lungs- +mechanical breathing sounds.  Heart- Regular rate and rhythm, no murmurs, rubs or gallops GI- soft Extremities- no clubbing or cyanosis.  No edema Skin- no rash or lesion Neuro- intubated. Responds to voice    Labs    CBC Recent Labs    08/17/19 0334 08/18/19 0423  WBC 11.7* 12.7*  HGB 8.7* 8.7*  HCT 31.0* 31.3*  MCV 100.3* 102.3*  PLT 171 976   Basic Metabolic Panel Recent Labs    08/17/19 0334 08/18/19 0423  NA 138 139  K 4.4 4.7  CL 108 110  CO2 19* 18*  GLUCOSE 183* 192*  BUN 45* 54*  CREATININE 1.23 1.53*  CALCIUM 8.2* 8.3*  MG 2.0 2.3   Liver Function Tests No results for input(s): AST, ALT, ALKPHOS, BILITOT, PROT, ALBUMIN in the last 72 hours. No results for input(s): LIPASE, AMYLASE in the last 72 hours. Cardiac Enzymes No results for input(s): CKTOTAL, CKMB, CKMBINDEX, TROPONINI in the last 72 hours.   Telemetry    NSR 60-70s, occasional ectopy including trigeminy.  (personally reviewed)  Radiology    DG Chest Port 1 View  Result  Date: 08/17/2019 CLINICAL DATA:  Pneumonia.  Left ventricular assist device. EXAM: PORTABLE CHEST 1 VIEW COMPARISON:  08/15/2019 FINDINGS: The heart is enlarged but stable. Stable left ventricular assist device. No complicating features are identified. The endotracheal tube is 2.7 cm above the carina. The NG tube is coursing down the esophagus and into the stomach. Right PICC line tip is stable in the distal SVC. Persistent and perhaps slightly progressive vascular congestion and interstitial edema. Persistent patchy bibasilar atelectasis, left greater than right. No large pleural effusions or pneumothorax. IMPRESSION: 1. Stable support apparatus. 2. Persistent and perhaps slightly progressive vascular congestion and interstitial edema. 3. Persistent bibasilar atelectasis, left greater than right. Electronically Signed   By: Aaron [name] M.D.   On: 08/17/2019 09:36   DG Abd Portable 1V  Result Date: 08/17/2019 CLINICAL DATA:  Feeding tube placement. EXAM: PORTABLE ABDOMEN - 1 VIEW COMPARISON:  08/13/2019 FINDINGS: The previous enteric tube has been removed, and a feeding tube has been placed with tip projecting in the expected region of the distal stomach or proximal duodenum. No dilated loops of bowel are seen in the included upper portion of the abdomen. A left ventricular assist device is noted. Bilateral lung opacities were more fully evaluated on today's earlier chest radiograph. IMPRESSION: Feeding tube tip in the distal stomach or proximal duodenum. Electronically Signed   By: Aaron [name] M.D.   On: 08/17/2019 16:16    Patient Profile     Aaron Dolores Noguesis a 54 y.o.malewith a past medical history significant for CAD and recent LVAD placement. Post op course complicated by incessant VT. Quieted and transferred to CIR where he had recurrent VF. Re-admitted to ICU.  - Had refractory VF throughout the night 7/28 w/ ~10 shocks. Now intubated.  - Developed bradycardia and further VF 7/30 am, TTVP  + IABP placed 7/30 am.  - 8/1 completed cefepime, switched to ancef. Had VF 08/02/19 shock x1  - 8/3 Echo ramp, increased speed to 5400. VF x3. Failed IABP wean. Speed decreased to 5300.  - 08/05/19 VF shock x1 - 8/5 IABP removed. VT shock x 3 when sedation weaned and patient agitated.  - 8/6 Extubated, VT with shock x 3, reintubated.  Assessment & Plan    1. Refractory VF Rhythm remains stable on IV amio and quinidine 150 mg q 6 hrs.  No further VT since 08/07/19 No BB with BP and shock.  Pt remains critically ill but now waking up on vent.  QTc ~  470 when corrected for QRS (or even less). PR interval stale around 250-260 ms.   2. Acute hypoxic respiratory failure in setting of recurrent VF/VT Afebrile ABX per CCM and primary.  +Klebsiella HAP -> Narrowed from Zosyn to ceftriaxone 8/11 -> Meropenem 8/12 with recurrent fever.  Fungal coverage started as well.  + Klebsiella/CNS in sputum. All other Cx negative.    For questions or updates, please contact [place] Please consult [email or web address] for contact info under Cardiology/STEMI.  Signed, Shirley Friar, PA-C  08/18/2019, 8:45 AM    I have seen, examined the patient, and reviewed the above assessment and plan.  Changes to above are made where necessary.  On exam, ill appearing,  RRR.  Rhythm remains stable.  Difficulty with respiratory failure noted. Continue current antiarrhythmic therapy.  Co Sign: Thompson Grayer, MD 08/18/2019 10:08 AM

## 2019-08-18 NOTE — Progress Notes (Addendum)
Aaron [name] for Heparin, warfarin (hold) Indication: LVAD  Allergies  Allergen Reactions  . Codeine Hives    Patient Measurements: Height: 5\' 9"  (175.3 cm) Weight: 98.8 kg (217 lb 13 oz) IBW/kg (Calculated) : 70.7 Heparin Dosing Weight: ~ 92 kg  Vital Signs: Temp: 98.9 F (37.2 C) (08/17 1110) Temp Source: Oral (08/17 1110) BP: 82/63 (08/17 1200) Pulse Rate: 70 (08/17 1200)  Labs: Recent Labs    08/16/19 0323 08/16/19 0323 08/17/19 0334 08/18/19 0423  HGB 9.1*   < > 8.7* 8.7*  HCT 32.6*  --  31.0* 31.3*  PLT 214  --  171 164  LABPROT 15.5*  --  14.2 15.4*  INR 1.3*  --  1.1 1.3*  HEPARINUNFRC 0.23*  --  0.22* 0.22*  CREATININE 1.31*  --  1.23 1.53*   < > = values in this interval not displayed.    Estimated Creatinine Clearance: 63.2 mL/min (A) (by C-G formula based on SCr of 1.53 mg/dL (H)).  Assessment: 54 yo male s/p LVAD HM3 placement 6/24, followed by IABP 7/8-7/13. 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 IABP removed 8/5, heparin restarted for LVAD. Extubated and reintubated 8/6. Warfarin restarted on 8/12.  Heparin level is therapeutic at 0.22 on 1150 units/hr. He has been therapeutic on this drip rate for many days. .CBC, INR, LDH stable. No infusion issues.  Patient now s/p trach - discussed with Dr. Tamala Julian, okay to resume heparin now.  Discussed with Dr. Haroldine Laws, okay to resume Coumadin tonight as well.  Goal of Therapy:  Heparin level goal 0.2-0.5 w/ LVAD Monitor platelets by anticoagulation protocol: Yes  INR 2-2.5   Plan:  Restart heparin infusion at 1150 units/hr. Check heparin level in 6 hrs to confirm. Resume Coumadin tonight with 2.5 mg x 1 Daily heparin level, CBC, INR Monitor for bleeding  [place] Crane, Vena [place], BCPS, BCCP Clinical Pharmacist  08/18/2019 12:42 PM   [facility] pharmacy phone numbers are listed on [email or web address]

## 2019-08-18 NOTE — Progress Notes (Addendum)
Patient ID: Aaron [name], male   DOB: 08/22/1965, 54 y.o.   MRN: [id number]   Advanced Heart Failure VAD Team Note  PCP-Cardiologist: No primary care provider on file.   Subjective:    - Had refractory VF throughout the night 7/28 w/ ~10 shocks. Now intubated.  - Developed bradycardia and further VF 7/30 am, TTVP + IABP placed 7/30 am.   - 8/1 completed cefepime, switched to ancef. Had VF 08/02/19 shock x1  - 8/3 Echo ramp, increased speed to 5400. VF x3. Failed IABP wean. Speed decreased to 5300.   - 08/05/19 VF shock x1 - 8/5 IABP removed.  VT shock x 3 when sedation weaned and patient agitated.  - 8/6 Extubated, VT with shock x 3, reintubated.  -8/8 Lidocaine stopped. Quinidine started  -8/11 EGD negative for bleed. + gastritis  -8/12 started on meropenum for persistent fevers and worsening leukocytosis  - 8/13 started Eraxis  Awake on vent. Yesterday diuresed with IV lasix. Remains positive.   Remains on vasopressin.   Afebrile. WBC 12.7. Remains on meropenem +Eraxis+ vanc   Has klebsiella/CNS in sputum. All other cx negative   VT/VF quiescent on quinidine + amio drip.   Co-ox 67%.   LDH 351   LVAD INTERROGATION:  HeartMate 3 LVAD:   Flow 4.3 liters/min, speed 5300, power 4, PI 2.8.  No PI events. VAD interrogated personally. Parameters stable.  Objective:    Vital Signs:   Temp:  [98.3 F (36.8 C)-98.7 F (37.1 C)] 98.7 F (37.1 C) (08/17 0000) Pulse Rate:  [32-86] 78 (08/17 0630) Resp:  [9-34] 19 (08/17 0630) BP: (70-107)/(57-93) 97/81 (08/17 0630) SpO2:  [95 %-100 %] 98 % (08/17 0630) Arterial Line BP: (78-110)/(65-98) 78/65 (08/17 0630) FiO2 (%):  [40 %] 40 % (08/17 0411) Weight:  [98.8 kg] 98.8 kg (08/17 0500) Last BM Date: 08/17/19 Mean arterial Pressure 90s   Intake/Output:   Intake/Output Summary (Last 24 hours) at 08/18/2019 0703 Last data filed at 08/18/2019 0600 Gross per 24 hour  Intake 3071.42 ml  Output 1630 ml  Net 1441.42 ml     Physical  Exam  CVP 13 Physical Exam: GENERAL: No acute distress. HEENT: ETT NECK: Supple, JVP 12-13  .  2+ bilaterally, no bruits.  No lymphadenopathy or thyromegaly appreciated.   CARDIAC:  Mechanical heart sounds with LVAD hum present.  LUNGS:  Clear to auscultation bilaterally.  ABDOMEN:  Soft, round, nontender, positive bowel sounds x4.     LVAD exit site: well-healed and incorporated.  Dressing dry and intact.  No erythema or drainage.  Stabilization device present and accurately applied.  Driveline dressing is being changed daily per sterile technique. EXTREMITIES:  Warm and dry, no cyanosis, clubbing, rash. R and LLE 2+  edema  NEUROLOGIC:  Awake on vent. Follows commands.       Telemetry   SR 60-70s with PVCs  Labs   Basic Metabolic Panel: Recent Labs  Lab 08/14/19 0443 08/14/19 0443 08/15/19 0319 08/15/19 1213 08/15/19 1213 08/16/19 0323 08/17/19 0334 08/18/19 0423  NA 153*  --   --  150*  --  145 138 139  K 4.4  --   --  4.1  --  4.2 4.4 4.7  CL 117*  --   --  120*  --  116* 108 110  CO2 25  --   --  20*  --  20* 19* 18*  GLUCOSE 151*  --   --  162*  --  152*  183* 192*  BUN 58*  --   --  52*  --  49* 45* 54*  CREATININE 1.58*  --   --  1.40*  --  1.31* 1.23 1.53*  CALCIUM 8.5*   < >  --  8.2*   < > 8.2* 8.2* 8.3*  MG 2.0  --  2.1  --   --  1.9 2.0 2.3   < > = values in this interval not displayed.    Liver Function Tests: Recent Labs  Lab 08/21/2019 0321 08/13/19 0446 08/14/19 0443  AST 87* 121* 219*  ALT 50* 75* 144*  ALKPHOS 101 92 81  BILITOT 0.3 0.7 0.8  PROT 5.9* 6.4* 6.3*  ALBUMIN 1.9* 2.1* 2.2*   No results for input(s): LIPASE, AMYLASE in the last 168 hours. No results for input(s): AMMONIA in the last 168 hours.  CBC: Recent Labs  Lab 08/14/19 0443 08/15/19 0319 08/16/19 0323 08/17/19 0334 08/18/19 0423  WBC 13.7* 14.1* 13.1* 11.7* 12.7*  HGB 9.3* 9.4* 9.1* 8.7* 8.7*  HCT 32.4* 33.3* 32.6* 31.0* 31.3*  MCV 99.4 100.3* 100.3* 100.3* 102.3*    PLT 268 260 214 171 164    INR: Recent Labs  Lab 08/14/19 0443 08/15/19 0319 08/16/19 0323 08/17/19 0334 08/18/19 0423  INR 1.3* 1.2 1.3* 1.1 1.3*    Other results:    Imaging   DG Chest Port 1 View  Result Date: 08/17/2019 CLINICAL DATA:  Pneumonia.  Left ventricular assist device. EXAM: PORTABLE CHEST 1 VIEW COMPARISON:  08/15/2019 FINDINGS: The heart is enlarged but stable. Stable left ventricular assist device. No complicating features are identified. The endotracheal tube is 2.7 cm above the carina. The NG tube is coursing down the esophagus and into the stomach. Right PICC line tip is stable in the distal SVC. Persistent and perhaps slightly progressive vascular congestion and interstitial edema. Persistent patchy bibasilar atelectasis, left greater than right. No large pleural effusions or pneumothorax. IMPRESSION: 1. Stable support apparatus. 2. Persistent and perhaps slightly progressive vascular congestion and interstitial edema. 3. Persistent bibasilar atelectasis, left greater than right. Electronically Signed   By: Marijo Sanes M.D.   On: 08/17/2019 09:36   DG Abd Portable 1V  Result Date: 08/17/2019 CLINICAL DATA:  Feeding tube placement. EXAM: PORTABLE ABDOMEN - 1 VIEW COMPARISON:  08/07/2019 FINDINGS: The previous enteric tube has been removed, and a feeding tube has been placed with tip projecting in the expected region of the distal stomach or proximal duodenum. No dilated loops of bowel are seen in the included upper portion of the abdomen. A left ventricular assist device is noted. Bilateral lung opacities were more fully evaluated on today's earlier chest radiograph. IMPRESSION: Feeding tube tip in the distal stomach or proximal duodenum. Electronically Signed   By: Logan Bores M.D.   On: 08/17/2019 16:16     Medications:     Scheduled Medications: . alteplase  2 mg Intracatheter Once  . aspirin  81 mg Per Tube Daily  . atorvastatin  80 mg Per Tube Daily   . bisacodyl  10 mg Rectal Once  . chlorhexidine gluconate (MEDLINE KIT)  15 mL Mouth Rinse BID  . Chlorhexidine Gluconate Cloth  6 each Topical Daily  . clonazePAM  1 mg Per Tube BID  . docusate  100 mg Oral BID  . etomidate  40 mg Intravenous Once  . feeding supplement (PROSource TF)  90 mL Per Tube BID  . fentaNYL (SUBLIMAZE) injection  200 mcg Intravenous Once  .  folic acid  1 mg Per Tube Daily  . free water  300 mL Per Tube Q3H  . furosemide  40 mg Intravenous BID  . gabapentin  300 mg Per Tube Q8H  . insulin aspart  0-15 Units Subcutaneous Q4H  . insulin aspart  3 Units Subcutaneous Q4H  . mouth rinse  15 mL Mouth Rinse 10 times per day  . midazolam  5 mg Intravenous Once  . midodrine  15 mg Per Tube TID WC  . multivitamin  15 mL Per Tube Daily  . oxybutynin  5 mg Oral BID  . pantoprazole (PROTONIX) IV  40 mg Intravenous Q12H  . polyethylene glycol  17 g Oral Daily  . quiNIDine sulfate  150 mg Per Tube Q6H  . sildenafil  20 mg Per Tube TID  . sodium chloride flush  10-40 mL Intracatheter Q12H  . thiamine  100 mg Per Tube Daily  . vecuronium  10 mg Intravenous Once    Infusions: . sodium chloride    . sodium chloride 10 mL/hr at 08/02/2019 1100  . sodium chloride 20 mL/hr at 08/01/19 1700  . amiodarone 30 mg/hr (08/18/19 0600)  . dexmedetomidine (PRECEDEX) IV infusion Stopped (08/16/19 1145)  . feeding supplement (VITAL 1.5 CAL) 1,000 mL (08/17/19 1246)  . fentaNYL infusion INTRAVENOUS 175 mcg/hr (08/18/19 0600)  . heparin 1,150 Units/hr (08/18/19 0600)  . meropenem (MERREM) IV Stopped (08/18/19 0548)  . phenylephrine (NEO-SYNEPHRINE) Adult infusion Stopped (08/17/19 1818)  . vancomycin Stopped (08/17/19 2339)  . vasopressin 0.02 Units/min (08/18/19 0600)    PRN Medications: Place/Maintain arterial line **AND** sodium chloride, sodium chloride, acetaminophen, fentaNYL, midazolam, sodium chloride flush   Assessment/Plan:    1. Refractory Ventricular Fibrillation   -multiple episodes ~10 of VF on 7/28 - multiple episodes 7/30 am => IABP placed as well as TTVP given relative bradycardia.  IABP and TTVP now out.  Continued to have VF 7/30 , 7/31, 8/1, 8/3, 8/4, 8/6.    - Ranexa and Mexiletine currently on hold (unable to crush and administer through tube).  - Sotalol not a good option given his history of bradycardia.  - Lidocaine discontinued 8/8 - now on quinidine 150 mg q6h. No VT/VF since addition of quinidine. QT ok - Continue amiodarone gtt at 30 mg/hr.   - Keep K > 4.0 and Mg > 2.0.Stable.  - Avoid inotropes   2. Acute Systolic Heart Failure s/p VAD - EF 84%, RV systolic function mildly reduced - HMIII LVAD placed 06/25/19.  - RV looks good on echo, cannula ok  - Co-ox 68%. Off Neo.  - avoid inotropes with VF, remains on midodrine 15 mg tid +VP. Hopefully can wean.  - maps 90s.  - Off digoxin with bradycardia/AKI.  - Continue Heparin gtt, off warfarin currently for trach.  -LDH stable today at 351 - VAD interrogated personally. Parameters stable. No events.  - on ASA 81 mg daily.  - CVP 13-14. Continue lasix 40 mg twice a day.   3. Acute hypoxic resp failure - intubated in setting of VF - failing vent wean. Plan for trach today.  - vent management per PCCM.    4. CAD - S/p Acute STEMI 6/21.Emergent cath showed severe multivessel disease w/ 75% mid- distal LM, 100% prox LAD, not amendable to PCI (unable to cross w/ guide wire, probable CTO), 100% prox LCx (probable CTO) and 100% ostial RCA (probable CTO). There were mildto moderate collaterals right to left to the LAD, minimal right to  right collateralsan minimalleft to left collaterals.LVEF 25% -Hs trop peaked 27,000. - No targets for CABG / PCI.  - S/P HMIII VAD - Continue ASA + statin  - No change  5. Renal mass = RCC - MRI abdomen- 1.8 cm right renal lesion, most consistent with renal cell carcinoma. No evidence of renal vein involvement or nodal metastasis. -  Discussed w/ Urology and R RCC is small and will just require surveillance. Ok to proceed with VAD from their standpoint.   6. Urinary retention - continue abx - repeat urine culture 8/12 NGTD - currently w/ condom cath but requiring frequent I/O caths  7. ID:  - ?HCAP => Klebsiella in trach aspirate from 7/29.  Sputum now also growing CNS - Completed cefepime 08/02/19, switched to ancef - now on meropenum (started 8/12), Eraxis (start 8/13) and vanc (started 8/14) - Blood CX repeated 08/13/19, NGTD - Afebrile.  - continue current regimen   8. Anemia w/ Concern for GI Source  -mild rectal bleeding 8/8 and blood clot from mouth 8/10. Has had some bloody secretions.  - hgb stable at 8.7  - on heparin anticoagulation for LVAD - EGD 8/11 unremarkable. No bleeding  - transfuse if hgb drops <7. 0   9. AKI -overall improved (SCr peaked to 2.13)  - Cr trending up 1.2>1.5    10 Hypernatremia - Cut back free water to q6 hours.  - Na down to 139    of radiographic studies, pulse oximetry and re-evaluation of patient's condition.  Will need to PT/OT involved again.    Darrick Grinder, NP-C  08/18/2019  7:03 AM   Agree with above.   Underwent trach today. Remains hypotensive and required increased neo. Was having increased PVCs earlier today but no VT. Remains on broad spectrum abx  General:  Awake on vent through trach HEENT: normal  Neck: supple. JVP to ear Carotids 2+ bilat; no bruits. No lymphadenopathy or thryomegaly appreciated. Cor: LVAD hum.  Lungs: Coarse Abdomen: obese soft, nontender, non-distended. No hepatosplenomegaly. No bruits or masses. Good bowel sounds. Driveline site clean. Anchor in place.  Extremities: no cyanosis, clubbing, rash. Warm 3+ edema  Neuro: alert & oriented x 3. No focal deficits. Moves all 4 without problem   S/p trach. Looks better today. Markedly volume overloaded. Will increase diuretics and add metolazone. Having increased PVCs but not VT.  Continue quinidine. Continue neo for BP support. Can add VP as needed. VAD interrogated personally. Parameters stable. Back on heparin. Start warfarin.   CRITICAL CARE Performed by: Glori Bickers  Total critical care time: 35 minutes  Critical care time was exclusive of separately billable procedures and treating other patients.  Critical care was necessary to treat or prevent imminent or life-threatening deterioration.  Critical care was time spent personally by me (independent of midlevel providers or residents) on the following activities: development of treatment plan with patient and/or surrogate as well as nursing, discussions with consultants, evaluation of patient's response to treatment, examination of patient, obtaining history from patient or surrogate, ordering and performing treatments and interventions, ordering and review of laboratory studies, ordering and review of radiographic studies, pulse oximetry and re-evaluation of patient's condition.  Glori Bickers, MD  4:22 PM

## 2019-08-19 ENCOUNTER — Inpatient Hospital Stay (HOSPITAL_COMMUNITY): Payer: BC Managed Care – PPO

## 2019-08-19 LAB — CBC
HCT: 30.2 % — ABNORMAL LOW (ref 39.0–52.0)
Hemoglobin: 8.6 g/dL — ABNORMAL LOW (ref 13.0–17.0)
MCH: 29.5 pg (ref 26.0–34.0)
MCHC: 28.5 g/dL — ABNORMAL LOW (ref 30.0–36.0)
MCV: 103.4 fL — ABNORMAL HIGH (ref 80.0–100.0)
Platelets: 162 10*3/uL (ref 150–400)
RBC: 2.92 MIL/uL — ABNORMAL LOW (ref 4.22–5.81)
RDW: 19.4 % — ABNORMAL HIGH (ref 11.5–15.5)
WBC: 15.1 10*3/uL — ABNORMAL HIGH (ref 4.0–10.5)
nRBC: 0 % (ref 0.0–0.2)

## 2019-08-19 LAB — HEPARIN LEVEL (UNFRACTIONATED)
Heparin Unfractionated: 0.11 IU/mL — ABNORMAL LOW (ref 0.30–0.70)
Heparin Unfractionated: 0.22 IU/mL — ABNORMAL LOW (ref 0.30–0.70)

## 2019-08-19 LAB — BASIC METABOLIC PANEL
Anion gap: 13 (ref 5–15)
BUN: 71 mg/dL — ABNORMAL HIGH (ref 6–20)
CO2: 16 mmol/L — ABNORMAL LOW (ref 22–32)
Calcium: 8.1 mg/dL — ABNORMAL LOW (ref 8.9–10.3)
Chloride: 109 mmol/L (ref 98–111)
Creatinine, Ser: 2.08 mg/dL — ABNORMAL HIGH (ref 0.61–1.24)
GFR calc Af Amer: 40 mL/min — ABNORMAL LOW (ref >60)
GFR calc non Af Amer: 35 mL/min — ABNORMAL LOW (ref >60)
Glucose, Bld: 208 mg/dL — ABNORMAL HIGH (ref 70–99)
Potassium: 4.9 mmol/L (ref 3.5–5.1)
Sodium: 138 mmol/L (ref 135–145)

## 2019-08-19 LAB — GLUCOSE, CAPILLARY
Glucose-Capillary: 113 mg/dL — ABNORMAL HIGH (ref 70–99)
Glucose-Capillary: 121 mg/dL — ABNORMAL HIGH (ref 70–99)
Glucose-Capillary: 144 mg/dL — ABNORMAL HIGH (ref 70–99)
Glucose-Capillary: 133 mg/dL — ABNORMAL HIGH (ref 70–99)
Glucose-Capillary: 129 mg/dL — ABNORMAL HIGH (ref 70–99)

## 2019-08-19 LAB — COOXEMETRY PANEL
Carboxyhemoglobin: 2 % — ABNORMAL HIGH (ref 0.5–1.5)
Methemoglobin: 1.3 % (ref 0.0–1.5)
O2 Saturation: 75.5 %
Total hemoglobin: 8.7 g/dL — ABNORMAL LOW (ref 12.0–16.0)

## 2019-08-19 LAB — LACTATE DEHYDROGENASE: LDH: 355 U/L — ABNORMAL HIGH (ref 98–192)

## 2019-08-19 LAB — MAGNESIUM: Magnesium: 2.2 mg/dL (ref 1.7–2.4)

## 2019-08-19 LAB — PROTIME-INR
INR: 1.2 (ref 0.8–1.2)
Prothrombin Time: 15.2 seconds (ref 11.4–15.2)

## 2019-08-19 MED ORDER — WARFARIN SODIUM 2.5 MG PO TABS
2.5000 mg | ORAL_TABLET | Freq: Once | ORAL | Status: AC
  Administered 2019-08-19: 2.5 mg
  Filled 2019-08-19: qty 1

## 2019-08-19 MED ORDER — FUROSEMIDE 10 MG/ML IJ SOLN
40.0000 mg | Freq: Once | INTRAMUSCULAR | Status: AC
  Administered 2019-08-19: 40 mg via INTRAVENOUS
  Filled 2019-08-19: qty 4

## 2019-08-19 MED ORDER — VASOPRESSIN 20 UNITS/100 ML INFUSION FOR SHOCK
0.0000 [IU]/min | INTRAVENOUS | Status: DC
  Administered 2019-08-19 (×2): 0.03 [IU]/min via INTRAVENOUS
  Filled 2019-08-19 (×4): qty 100

## 2019-08-19 MED ORDER — FUROSEMIDE 10 MG/ML IJ SOLN
40.0000 mg | Freq: Two times a day (BID) | INTRAMUSCULAR | Status: DC
  Administered 2019-08-19: 40 mg via INTRAVENOUS
  Filled 2019-08-19: qty 4

## 2019-08-19 MED ORDER — POLYETHYLENE GLYCOL 3350 17 G PO PACK
17.0000 g | PACK | Freq: Every day | ORAL | Status: DC
  Administered 2019-08-19: 17 g
  Filled 2019-08-19: qty 1

## 2019-08-19 MED ORDER — SODIUM CHLORIDE 0.9 % IV SOLN
1.0000 g | Freq: Two times a day (BID) | INTRAVENOUS | Status: DC
  Administered 2019-08-19: 1 g via INTRAVENOUS
  Filled 2019-08-19 (×3): qty 1

## 2019-08-19 MED ORDER — OXYBUTYNIN CHLORIDE 5 MG PO TABS
5.0000 mg | ORAL_TABLET | Freq: Two times a day (BID) | ORAL | Status: DC
  Administered 2019-08-19 (×2): 5 mg
  Filled 2019-08-19 (×4): qty 1

## 2019-08-19 MED ORDER — FUROSEMIDE 10 MG/ML IJ SOLN
80.0000 mg | Freq: Two times a day (BID) | INTRAMUSCULAR | Status: DC
  Administered 2019-08-19: 80 mg via INTRAVENOUS
  Filled 2019-08-19: qty 8

## 2019-08-19 MED ORDER — DOCUSATE SODIUM 50 MG/5ML PO LIQD
100.0000 mg | Freq: Two times a day (BID) | ORAL | Status: DC
  Administered 2019-08-19 (×2): 100 mg
  Filled 2019-08-19 (×2): qty 10

## 2019-08-19 MED ORDER — PHENYLEPHRINE CONCENTRATED 100MG/250ML (0.4 MG/ML) INFUSION SIMPLE
0.0000 ug/min | INTRAVENOUS | Status: DC
  Administered 2019-08-19: 60 ug/min via INTRAVENOUS
  Filled 2019-08-19: qty 250

## 2019-08-19 MED ORDER — VANCOMYCIN HCL 1250 MG/250ML IV SOLN
1250.0000 mg | INTRAVENOUS | Status: DC
  Administered 2019-08-19: 1250 mg via INTRAVENOUS
  Filled 2019-08-19: qty 250

## 2019-08-19 NOTE — Progress Notes (Signed)
Electrophysiology Rounding Note  Patient Name: Aaron [name] Date of Encounter: 08/19/2019  Primary Cardiologist: Dr. Haroldine Laws  Electrophysiologist: Dr. Rayann Heman   Subjective   Remains intubated, awake at times.   Remains on vasopressin.   Inpatient Medications    Scheduled Meds: . alteplase  2 mg Intracatheter Once  . aspirin  81 mg Per Tube Daily  . atorvastatin  80 mg Per Tube Daily  . bisacodyl  10 mg Rectal Once  . chlorhexidine gluconate (MEDLINE KIT)  15 mL Mouth Rinse BID  . Chlorhexidine Gluconate Cloth  6 each Topical Daily  . clonazePAM  1 mg Per Tube BID  . docusate  100 mg Per Tube BID  . feeding supplement (PROSource TF)  90 mL Per Tube BID  . fentaNYL (SUBLIMAZE) injection  200 mcg Intravenous Once  . folic acid  1 mg Per Tube Daily  . furosemide  40 mg Intravenous BID  . gabapentin  300 mg Per Tube Q8H  . insulin aspart  0-15 Units Subcutaneous Q4H  . insulin aspart  3 Units Subcutaneous Q4H  . mouth rinse  15 mL Mouth Rinse 10 times per day  . midodrine  15 mg Per Tube TID WC  . multivitamin  15 mL Per Tube Daily  . oxybutynin  5 mg Per Tube BID  . pantoprazole (PROTONIX) IV  40 mg Intravenous Q12H  . polyethylene glycol  17 g Per Tube Daily  . quiNIDine sulfate  150 mg Per Tube Q6H  . sildenafil  20 mg Per Tube TID  . sodium chloride flush  10-40 mL Intracatheter Q12H  . thiamine  100 mg Per Tube Daily  . warfarin  2.5 mg Per Tube ONCE-1600  . Warfarin - Pharmacist Dosing Inpatient   Does not apply q1600   Continuous Infusions: . sodium chloride    . sodium chloride 10 mL/hr at 08/29/2019 1100  . sodium chloride 20 mL/hr at 08/01/19 1700  . amiodarone 30 mg/hr (08/19/19 0600)  . dexmedetomidine (PRECEDEX) IV infusion Stopped (08/16/19 1145)  . feeding supplement (VITAL 1.5 CAL) 1,000 mL (08/18/19 1158)  . fentaNYL infusion INTRAVENOUS 125 mcg/hr (08/19/19 0622)  . heparin 1,350 Units/hr (08/19/19 0748)  . meropenem (MERREM) IV    .  phenylephrine (NEO-SYNEPHRINE) Adult infusion 20 mcg/min (08/19/19 0749)  . vancomycin    . vasopressin Stopped (08/18/19 1725)   PRN Meds: Place/Maintain arterial line **AND** sodium chloride, sodium chloride, acetaminophen, fentaNYL, midazolam, sodium chloride flush   Vital Signs    Vitals:   08/19/19 0726 08/19/19 0741 08/19/19 0800 08/19/19 0826  BP:  (!) 83/72 (!) 82/73   Pulse:  74  83  Resp:  16  (!) 21  Temp: 99.5 F (37.5 C)     TempSrc: Oral     SpO2:  100%  94%  Weight:      Height:        Intake/Output Summary (Last 24 hours) at 08/19/2019 0838 Last data filed at 08/19/2019 0600 Gross per 24 hour  Intake 3056.69 ml  Output 1290 ml  Net 1766.69 ml   Filed Weights   08/17/19 0500 08/18/19 0500 08/19/19 0500  Weight: 95.3 kg 98.8 kg 102.7 kg    Physical Exam    GEN- The patient is intubated and sedated. Responds to voice.  HEENT- + ET tube.  Lungs- +mechanical breathing sounds.  Heart- Regular rate and rhythm, no murmurs, rubs or gallops GI- soft Extremities- no clubbing or cyanosis. No edema Skin- no  rash or lesion Neuro- intubated and sedated.   Labs    CBC Recent Labs    08/18/19 0423 08/19/19 0419  WBC 12.7* 15.1*  HGB 8.7* 8.6*  HCT 31.3* 30.2*  MCV 102.3* 103.4*  PLT 164 694   Basic Metabolic Panel Recent Labs    08/18/19 0423 08/19/19 0419  NA 139 138  K 4.7 4.9  CL 110 109  CO2 18* 16*  GLUCOSE 192* 208*  BUN 54* 71*  CREATININE 1.53* 2.08*  CALCIUM 8.3* 8.1*  MG 2.3 2.2   Liver Function Tests No results for input(s): AST, ALT, ALKPHOS, BILITOT, PROT, ALBUMIN in the last 72 hours. No results for input(s): LIPASE, AMYLASE in the last 72 hours. Cardiac Enzymes No results for input(s): CKTOTAL, CKMB, CKMBINDEX, TROPONINI in the last 72 hours.   Telemetry    NSR 70-80s (personally reviewed)  Radiology    DG CHEST PORT 1 VIEW  Result Date: 08/18/2019 CLINICAL DATA:  LVAD.  Tracheostomy placement EXAM: PORTABLE CHEST 1  VIEW COMPARISON:  Yesterday FINDINGS: Tracheostomy tube in place. Feeding tube which at least reaches the diaphragm. LVAD. Right PICC with tip in stable good position. Unchanged generalized interstitial and airspace opacity which may be failure or infection. No evidence of air leak. Stable cardiomegaly IMPRESSION: 1. New tracheostomy tube without complicating feature. 2. Otherwise stable chest. Electronically Signed   By: Monte Fantasia M.D.   On: 08/18/2019 11:22   DG Chest Port 1 View  Result Date: 08/17/2019 CLINICAL DATA:  Pneumonia.  Left ventricular assist device. EXAM: PORTABLE CHEST 1 VIEW COMPARISON:  08/15/2019 FINDINGS: The heart is enlarged but stable. Stable left ventricular assist device. No complicating features are identified. The endotracheal tube is 2.7 cm above the carina. The NG tube is coursing down the esophagus and into the stomach. Right PICC line tip is stable in the distal SVC. Persistent and perhaps slightly progressive vascular congestion and interstitial edema. Persistent patchy bibasilar atelectasis, left greater than right. No large pleural effusions or pneumothorax. IMPRESSION: 1. Stable support apparatus. 2. Persistent and perhaps slightly progressive vascular congestion and interstitial edema. 3. Persistent bibasilar atelectasis, left greater than right. Electronically Signed   By: Marijo Sanes M.D.   On: 08/17/2019 09:36   DG Abd Portable 1V  Result Date: 08/17/2019 CLINICAL DATA:  Feeding tube placement. EXAM: PORTABLE ABDOMEN - 1 VIEW COMPARISON:  08/06/2019 FINDINGS: The previous enteric tube has been removed, and a feeding tube has been placed with tip projecting in the expected region of the distal stomach or proximal duodenum. No dilated loops of bowel are seen in the included upper portion of the abdomen. A left ventricular assist device is noted. Bilateral lung opacities were more fully evaluated on today's earlier chest radiograph. IMPRESSION: Feeding tube tip in  the distal stomach or proximal duodenum. Electronically Signed   By: Logan Bores M.D.   On: 08/17/2019 16:16    Patient Profile     Aaron [name] a 54 y.o.malewith a past medical history significant for CAD and recent LVAD placement. Post op course complicated by incessant VT. Quieted and transferred to CIR where he had recurrent VF. Re-admitted to ICU.  - Had refractory VF throughout the night 7/28 w/ ~10 shocks. Now intubated.  - Developed bradycardia and further VF 7/30 am, TTVP + IABP placed 7/30 am.  - 8/1 completed cefepime, switched to ancef. Had VF 08/02/19 shock x1  - 8/3 Echo ramp, increased speed to 5400. VF x3. Failed IABP wean.  Speed decreased to 5300.  - 08/05/19 VF shock x1 - 8/5 IABP removed. VT shock x 3 when sedation weaned and patient agitated.  - 8/6 Extubated, VT with shock x 3, reintubated.  Assessment & Plan    1. Refractory VF Rhythm remains stable on IV amio and quinidine 150 mg q 6 hrs.  No further VT since 08/07/19 No BB with BP and shock.  Pt remains critically ill on vent. Waking up.  QTc ~470 when corrected for QRS (or even less). PR interval stable around 250-260 ms on 8/17. Will recheck tomorrow and continue to follow q 2-3 days.    2. Acute hypoxic respiratory failure in setting of recurrent VF/VT Afebrile ABX per CCM and primary.  +Klebsiella HAP ->Narrowed from Zosyn to ceftriaxone 8/11 ->Meropenem 8/12 with recurrent fever.  Fungal coverage started as well.  + Klebsiella/CNS in sputum. All other Cx negative.  CCM following.  For questions or updates, please contact [place] Please consult [email or web address] for contact info under Cardiology/STEMI.  Signed, Shirley Friar, PA-C  08/19/2019, 8:38 AM

## 2019-08-19 NOTE — Progress Notes (Signed)
[place] for Heparin, warfarin (hold) Indication: LVAD  Allergies  Allergen Reactions  . Codeine Hives    Patient Measurements: Height: 5\' 9"  (175.3 cm) Weight: 102.7 kg (226 lb 6.6 oz) IBW/kg (Calculated) : 70.7 Heparin Dosing Weight: ~ 92 kg  Vital Signs: Temp: 99.3 F (37.4 C) (08/18 0334) Temp Source: Oral (08/18 0334) BP: 90/71 (08/18 0630) Pulse Rate: 73 (08/18 0645)  Labs: Recent Labs    08/17/19 0334 08/17/19 0334 08/18/19 0423 08/18/19 1844 08/19/19 0419  HGB 8.7*   < > 8.7*  --  8.6*  HCT 31.0*  --  31.3*  --  30.2*  PLT 171  --  164  --  162  LABPROT 14.2  --  15.4*  --  15.2  INR 1.1  --  1.3*  --  1.2  HEPARINUNFRC 0.22*   < > 0.22* 0.13* 0.11*  CREATININE 1.23  --  1.53*  --  2.08*   < > = values in this interval not displayed.    Estimated Creatinine Clearance: 47.4 mL/min (A) (by C-G formula based on SCr of 2.08 mg/dL (H)).  Assessment: 54 yo male s/p LVAD HM3 placement 6/24, followed by IABP 7/8-7/13. 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 IABP removed 8/5, heparin restarted for LVAD. Extubated and reintubated 8/6. Warfarin restarted on 8/12.  Heparin level is still subtherapeutic at 0.11 on 1250 units/hr. CBC, INR, LDH stable. No infusion issues.  Warfarin restarted yesterday, INR still low. Will continue titrating dose. No overt bleeding noted. CBC stable.   Goal of Therapy:  Heparin level goal 0.2-0.5 w/ LVAD Monitor platelets by anticoagulation protocol: Yes  INR 2-2.5   Plan:  Increase heparin infusion to 1350 units/hr. Check heparin level in 6 hrs. Resume Coumadin tonight with 2.5 mg x 1 Daily heparin level, CBC, INR Monitor for bleeding  Erin Hearing PharmD., BCPS Clinical Pharmacist 08/19/2019 7:33 AM

## 2019-08-19 NOTE — Progress Notes (Signed)
Pt went into Vtach for brief period. View ecg strip. RN at bedside when occurred. Code cart brought to bedside by RT. RN getting pads ready to place on pt and pt converted back into NSR on his own without intervention. MD at bedside. Will continue to monitor  [place]

## 2019-08-19 NOTE — Progress Notes (Addendum)
NAME:  Aaron [name], MRN:  [id number], DOB:  19-Mar-1965, LOS: 43 ADMISSION DATE:  07/25/2019, CONSULTATION DATE:  07/30/19 REFERRING MD:  Heart Failure, CHIEF COMPLAINT:  Respiratory failure -- VF, LVAD   Brief History   54 yo PMH acute systolic heart failure s/p LVAD (06/25/19), recurrent VT/VF/Torsades, multivessel CAD, R renal mass c/f RCC, suspected OSA, who presents back to ICU after prolonged hospital course after which he was transferred to [facility] 7/23. At CIR, pt developed dyspnea, hypoxia following PT 7/27. ECG obtained and reveals VF rate 210 bpm. Transferred to CVICU 7/27 for emergent DCCV. Intubated by anesthesia. DCCV performed numerous times 7/27, 7/28. Remains intubated requiring frequent cardioversion.  PCCM consulted 7/29 for vent management   Past Medical History  Acute systolic heart failure s/p LVAD CAD Recurrent VF, VT, torsades  Renal mass Debility Sleep apnea   Significant Hospital Events   6/16 admitted with acute anterolateral STEMI, cardiogenic shock requiring IABP 6/25 LVAD placement 7/03 VT/VT/Torsades requiring multiple emergent DCCV  7/06 recurrent VT/VT/Torsades requiring multiple emergent DCCV  7/07 recurrent VT/VT/Torsades requiring multiple emergent DCCV  7/08 PNA, respiratory failure, required intubation 7/13 IABP removed, extubated 7/23 discharred to CIR 7/27 recurrent VF, admit to CVICU for DCCV. Intubated 7/28 recurrent VF, DCCV multiple times 7/29 PCCM consult for vent  7/30 vt overnight defib x2 7/31 DCCV x2, improved on versed / no further shocks 7/31>> present, intermittent vtach/shocks correlating with agitation 8/6 failed extubation trial 8/12 fever 8/13 Tolerated 2 hours trach collar however was re-sedated for reported anxiety and distress 8/14 Unable to tolerate PS due to tachypnea  8/16: Off Neo-Synephrine. Fever curve fairly stable white blood cell count coming down volume overloaded. Resuming low-dose diuresis. Tolerating pressure  support of 10 however 2 weeks for extubation 8/17: Trach Consults:  EP PCCM  Procedures:  ETT 7/27 >8/6> exchanged 8/11 (cuff leak ) >> Art line 7/28 >> RUE PICC 6/28 >>  R Femoral Venous Sheath 7/30 >> out IABP 7/30 >> out 8/17> Tracheostomy   Significant Diagnostic Tests:  CT Chest 07/09/19 - 1. Interval placement LVAD with loculated appearing fluid in the anterior mediastinum and surrounding the outflow graft. This is nonspecific and although may represent postsurgical seroma, infection is not excluded. Clinical correlation is recommended. 2. Ill-defined faint high attenuating content above the aortic valve leaflets suboptimally characterized. Blood clot is not excluded. 3. Small bilateral pleural effusions and findings of asymmetric edema. Pneumonia is not excluded. 4. No bowel obstruction. Normal appendix. 5. A 4 mm nonobstructing right renal upper pole calculus. No hydronephrosis. 6. Aortic Atherosclerosis (ICD10-I70.0).  EGD 8/11 neg  Bscopy 8/11 >> for hemoptysis, mucus mixed with blood especially right-sided airways, stringy endobronchial mucoid masslike lesion right middle lobe, could not be fully suctioned out Micro Data:  resp 7/29 >> klebsiella resp 8/8 >>>klebs >> R to ampi/ unasyn BAL Cx 8/11>>3K Klebsiella, coag neg staph 8/12 blood >>NG 8/12 urine>>NG   Antimicrobials:  Cefepime 7/28-8/1 Ancef 8/1 -8/3 Zosyn 8/8 >> 8/11 ceftx 8/11 >> 8/12 Meropenem 8/12 >> Vanc 8/15> Interim history/subjective:  Awake, able to follow some intermittent commands at bedside.   Objective   Blood pressure (!) 82/73, pulse 83, temperature 99.5 F (37.5 C), temperature source Oral, resp. rate (!) 21, height _0  (1.753 m), weight 102.7 kg, SpO2 94 %. CVP:  [13 mmHg-22 mmHg] 13 mmHg  Vent Mode: PRVC FiO2 (%):  [40 %] 40 % Set Rate:  [18 bmp] 18 bmp Vt Set:  [560 mL] 560  mL PEEP:  [5 cmH20] 5 cmH20 Pressure Support:  [8 cmH20] 8 cmH20 Plateau Pressure:  [22 cmH20-29  cmH20] 29 cmH20   Intake/Output Summary (Last 24 hours) at 08/19/2019 0913 Last data filed at 08/19/2019 0800 Gross per 24 hour  Intake 3077.4 ml  Output 1380 ml  Net 1697.4 ml   Filed Weights   08/17/19 0500 08/18/19 0500 08/19/19 0500  Weight: 95.3 kg 98.8 kg 102.7 kg   Physical Exam: Physical Exam Constitutional:      Comments: Trach in place, unable to speak, but follows commands intermittently.   Neck:     Comments: Trach in place, no drainage, erythema, or purulence noted at site of insertion.  Cardiovascular:     Rate and Rhythm: Normal rate and regular rhythm.     Pulses: Normal pulses.     Heart sounds: Normal heart sounds. No murmur heard.  No friction rub. No gallop.   Pulmonary:     Comments: Coarse breath sounds auscultated bilaterally. tachypneic Abdominal:     General: Abdomen is flat. Bowel sounds are normal.  Musculoskeletal:     Right lower leg: Edema present.     Left lower leg: Edema present.  Neurological:     Comments: Able to intermittently follow commands     Resolved Hospital Problem list   Sepsis due to UTI, Klebsiella Pneumoniae PNA-cefazolin, complete 7 days total (8/3 end date) Acute blood loss anemia - EGD neg Hypernatremia  Assessment & Plan:  54 year old s/p LVAD in 06/2019. Admitted 7/27 for VF requiring multiple DCCV and intubation. Failed extubation 8/6. Difficulty weaning due to agitation and weakness and may eventually require trach. Worsening respiratory status in the last 48 hours, now on low-dose pressors for septic shock secondary to presumed pneumonia. Previously on Zosyn>Ceftriaxone x 4d for Klebsiella however re-broadened to Meropenem for for new onset fevers.  Acute Hypoxic Respiratory Failure Likely 2/2 to Klebsiella and CoN Staph HCAP Initially intubated on 7/27, and again re-intubated on 8/6 after failed extubation. Major barrier to extubation at this point is profound deconditioning following prolonged critical illness as well  as element of delirium. Tracheostomy placed yesterday. WBC increased from 12.7 to 15 today, no fevers overnight likely 2/2 to procedure yesterday. Vitals stable.  Plan - Day #7 of meropenem, day #4 of vancomycin to complete 14 day course - Continue pressure support ventilation as tolerated. - PAD protocol RASS goal 0-1 (he note he is intolerant of Precedex due to bradycardia)  - Continue VAP bundle   RML endobronchial lesion visualized on 8/11 Has history of renal mass, worrisome for right renal cell carcinoma Plan  - RML visualized on 8/17 and was clear as were the bronchi on R down to subsegmental level, likely prior abnormality a blood clot which has since resolved.  - Observation in regards to renal cell mass   Acute metabolic encephalopathy Plan - Currently on fentanyl infusion, scheduled clonazepam taper as needed Versed for taper.  Acute systolic heart failure s/p LVAD placement (7/79/39); complicated by septic shock from HCAP Plan - On Neo, wean as tolerated.  - Continue midodrine 15 mg via tube TID - Additional Per heart failure team recommendations/interventions per cardiology  Recurrent VFib/Vtach requiring repeated DCCV: No episodes since 8/6 Plan - Continue quinidine and amiodarone per electrophysiology - Holding beta-blockade per heart failure team - Replete potassium to > 4 and magnesium > 2  - Continue telemetry monitoring  - Holding inotropic agents  - Watching QTC   AKI, likely  multifactorial 2/2 to hypoperfuson from shock and/or cardiorenal, medication.  Increase in sCr to 2.08, has peaked at 2.13 during this admission. On 950 mL UOP yesterday/ON, 3.1L/1.2 I/O. Up about 17 pounds.  - CHF holding metolazone today, plan to continue Lasix.   Coagulopathy, on warfarin PTA - Continuing Heparin, and can resume warfarin or oral therapy as directed by primary team  At Risk Malnutrition in setting of Critical Illness Plan  He will need prolonged tube feeds and  likely PEG tube  Best practice:  Diet: TF  Pain/Anxiety/Delirium protocol (if indicated): As above VAP protocol (if indicated): yes DVT prophylaxis: heparin per pharmacy  GI prophylaxis: protonix  Glucose control: SSI Mobility: BR Code Status: Full  Family Communication: Per primary Disposition: ICU    Maudie Mercury, MD IMTS, PGY-2 08/19/2019,12:57 PM

## 2019-08-19 NOTE — Progress Notes (Addendum)
Patient ID: Aaron [name], male   DOB: [birth year], 54 y.o.   MRN: [id number]   Advanced Heart Failure VAD Team Note  PCP-Cardiologist: No primary care provider on file.   Subjective:    - Had refractory VF throughout the night 7/28 w/ ~10 shocks. Now intubated.  - Developed bradycardia and further VF 7/30 am, TTVP + IABP placed 7/30 am.   - 8/1 completed cefepime, switched to ancef. Had VF 08/02/19 shock x1  - 8/3 Echo ramp, increased speed to 5400. VF x3. Failed IABP wean. Speed decreased to 5300.   - 08/05/19 VF shock x1 - 8/5 IABP removed.  VT shock x 3 when sedation weaned and patient agitated.  - 8/6 Extubated, VT with shock x 3, reintubated.  -8/8 Lidocaine stopped. Quinidine started  -8/11 EGD negative for bleed. + gastritis  -8/12 started on meropenum for persistent fevers and worsening leukocytosis  - 8/13 started Eraxis - 8/17 S/P Trach    Yesterday diuresed with IV lasix + metolazone. Creatinine trending up 1.5>2. CVP down to 12.   Remains on neo 20 mcg.    Afebrile. WBC 15.1 . Remains on meropenem +vanc   Has klebsiella/CNS in sputum. All other cx negative   VT/VF quiescent on quinidine + amio drip.   Co-ox 75%   LDH 355  LVAD INTERROGATION:  HeartMate 3 LVAD:   Flow 4.6 liters/min, speed 5300, power 4, PI 2.3.  No PI events. VAD interrogated personally. Parameters stable.  Objective:    Vital Signs:   Temp:  [98.1 F (36.7 C)-99.3 F (37.4 C)] 99.3 F (37.4 C) (08/18 0334) Pulse Rate:  [69-81] 73 (08/18 0645) Resp:  [6-25] 13 (08/18 0645) BP: (43-95)/(15-86) 90/71 (08/18 0630) SpO2:  [96 %-100 %] 97 % (08/18 0645) Arterial Line BP: (69-98)/(60-86) 76/68 (08/18 0645) FiO2 (%):  [40 %] 40 % (08/18 0400) Weight:  [102.7 kg] 102.7 kg (08/18 0500) Last BM Date: 08/18/19 Mean arterial Pressure 70-80s   Intake/Output:   Intake/Output Summary (Last 24 hours) at 08/19/2019 0709 Last data filed at 08/19/2019 0600 Gross per 24 hour  Intake 3145.21 ml  Output  1290 ml  Net 1855.21 ml     Physical Exam  CVP 12 Physical Exam: GENERAL: No acute distress. HEENT: normal  NECK: Trach , Supple, JVP difficult to assess Carotids  2+ bilaterally, no bruits.  No lymphadenopathy or thyromegaly appreciated.   CARDIAC:  Mechanical heart sounds with LVAD hum present.  LUNGS:  Rhonchi throughout.  ABDOMEN:  Soft, round, nontender, positive bowel sounds x4.     LVAD exit site:  Dressing dry and intact.  No erythema or drainage.  Stabilization device present and accurately applied.  Driveline dressing is being changed daily per sterile technique. EXTREMITIES:  Warm and dry, no cyanosis, clubbing, rash. R and LLE 1+ edema  NEUROLOGIC:  Sedated   Telemetry   SR 70s with NSVT   Labs   Basic Metabolic Panel: Recent Labs  Lab 08/14/19 0443 08/15/19 0319 08/15/19 1213 08/15/19 1213 08/16/19 0323 08/16/19 0323 08/17/19 0334 08/18/19 0423 08/19/19 0419  NA   < >  --  150*  --  145  --  138 139 138  K   < >  --  4.1  --  4.2  --  4.4 4.7 4.9  CL   < >  --  120*  --  116*  --  108 110 109  CO2   < >  --  20*  --  20*  --  19* 18* 16*  GLUCOSE   < >  --  162*  --  152*  --  183* 192* 208*  BUN   < >  --  52*  --  49*  --  45* 54* 71*  CREATININE   < >  --  1.40*  --  1.31*  --  1.23 1.53* 2.08*  CALCIUM   < >  --  8.2*   < > 8.2*   < > 8.2* 8.3* 8.1*  MG  --  2.1  --   --  1.9  --  2.0 2.3 2.2   < > = values in this interval not displayed.    Liver Function Tests: Recent Labs  Lab 08/13/19 0446 08/14/19 0443  AST 121* 219*  ALT 75* 144*  ALKPHOS 92 81  BILITOT 0.7 0.8  PROT 6.4* 6.3*  ALBUMIN 2.1* 2.2*   No results for input(s): LIPASE, AMYLASE in the last 168 hours. No results for input(s): AMMONIA in the last 168 hours.  CBC: Recent Labs  Lab 08/15/19 0319 08/16/19 0323 08/17/19 0334 08/18/19 0423 08/19/19 0419  WBC 14.1* 13.1* 11.7* 12.7* 15.1*  HGB 9.4* 9.1* 8.7* 8.7* 8.6*  HCT 33.3* 32.6* 31.0* 31.3* 30.2*  MCV 100.3*  100.3* 100.3* 102.3* 103.4*  PLT 260 214 171 164 162    INR: Recent Labs  Lab 08/15/19 0319 08/16/19 0323 08/17/19 0334 08/18/19 0423 08/19/19 0419  INR 1.2 1.3* 1.1 1.3* 1.2    Other results:    Imaging   DG CHEST PORT 1 VIEW  Result Date: 08/18/2019 CLINICAL DATA:  LVAD.  Tracheostomy placement EXAM: PORTABLE CHEST 1 VIEW COMPARISON:  Yesterday FINDINGS: Tracheostomy tube in place. Feeding tube which at least reaches the diaphragm. LVAD. Right PICC with tip in stable good position. Unchanged generalized interstitial and airspace opacity which may be failure or infection. No evidence of air leak. Stable cardiomegaly IMPRESSION: 1. New tracheostomy tube without complicating feature. 2. Otherwise stable chest. Electronically Signed   By: Monte Fantasia M.D.   On: 08/18/2019 11:22   DG Chest Port 1 View  Result Date: 08/17/2019 CLINICAL DATA:  Pneumonia.  Left ventricular assist device. EXAM: PORTABLE CHEST 1 VIEW COMPARISON:  08/15/2019 FINDINGS: The heart is enlarged but stable. Stable left ventricular assist device. No complicating features are identified. The endotracheal tube is 2.7 cm above the carina. The NG tube is coursing down the esophagus and into the stomach. Right PICC line tip is stable in the distal SVC. Persistent and perhaps slightly progressive vascular congestion and interstitial edema. Persistent patchy bibasilar atelectasis, left greater than right. No large pleural effusions or pneumothorax. IMPRESSION: 1. Stable support apparatus. 2. Persistent and perhaps slightly progressive vascular congestion and interstitial edema. 3. Persistent bibasilar atelectasis, left greater than right. Electronically Signed   By: Marijo Sanes M.D.   On: 08/17/2019 09:36   DG Abd Portable 1V  Result Date: 08/17/2019 CLINICAL DATA:  Feeding tube placement. EXAM: PORTABLE ABDOMEN - 1 VIEW COMPARISON:  08/29/2019 FINDINGS: The previous enteric tube has been removed, and a feeding tube  has been placed with tip projecting in the expected region of the distal stomach or proximal duodenum. No dilated loops of bowel are seen in the included upper portion of the abdomen. A left ventricular assist device is noted. Bilateral lung opacities were more fully evaluated on today's earlier chest radiograph. IMPRESSION: Feeding tube tip in the distal stomach or proximal duodenum. Electronically Signed  By: Logan Bores M.D.   On: 08/17/2019 16:16     Medications:     Scheduled Medications: . alteplase  2 mg Intracatheter Once  . aspirin  81 mg Per Tube Daily  . atorvastatin  80 mg Per Tube Daily  . bisacodyl  10 mg Rectal Once  . chlorhexidine gluconate (MEDLINE KIT)  15 mL Mouth Rinse BID  . Chlorhexidine Gluconate Cloth  6 each Topical Daily  . clonazePAM  1 mg Per Tube BID  . docusate  100 mg Oral BID  . feeding supplement (PROSource TF)  90 mL Per Tube BID  . fentaNYL (SUBLIMAZE) injection  200 mcg Intravenous Once  . folic acid  1 mg Per Tube Daily  . furosemide  80 mg Intravenous BID  . gabapentin  300 mg Per Tube Q8H  . insulin aspart  0-15 Units Subcutaneous Q4H  . insulin aspart  3 Units Subcutaneous Q4H  . mouth rinse  15 mL Mouth Rinse 10 times per day  . metolazone  2.5 mg Oral Daily  . midodrine  15 mg Per Tube TID WC  . multivitamin  15 mL Per Tube Daily  . oxybutynin  5 mg Oral BID  . pantoprazole (PROTONIX) IV  40 mg Intravenous Q12H  . polyethylene glycol  17 g Oral Daily  . quiNIDine sulfate  150 mg Per Tube Q6H  . sildenafil  20 mg Per Tube TID  . sodium chloride flush  10-40 mL Intracatheter Q12H  . thiamine  100 mg Per Tube Daily  . Warfarin - Pharmacist Dosing Inpatient   Does not apply q1600    Infusions: . sodium chloride    . sodium chloride 10 mL/hr at 08/13/2019 1100  . sodium chloride 20 mL/hr at 08/01/19 1700  . amiodarone 30 mg/hr (08/19/19 0600)  . dexmedetomidine (PRECEDEX) IV infusion Stopped (08/16/19 1145)  . feeding supplement (VITAL  1.5 CAL) 1,000 mL (08/18/19 1158)  . fentaNYL infusion INTRAVENOUS 125 mcg/hr (08/19/19 0622)  . heparin 1,250 Units/hr (08/19/19 0600)  . meropenem (MERREM) IV 1 g (08/19/19 6761)  . phenylephrine (NEO-SYNEPHRINE) Adult infusion 20 mcg/min (08/19/19 0600)  . vancomycin Stopped (08/19/19 0034)  . vasopressin Stopped (08/18/19 1725)    PRN Medications: Place/Maintain arterial line **AND** sodium chloride, sodium chloride, acetaminophen, fentaNYL, midazolam, sodium chloride flush   Assessment/Plan:    1. Refractory Ventricular Fibrillation  -multiple episodes ~10 of VF on 7/28 - multiple episodes 7/30 am => IABP placed as well as TTVP given relative bradycardia.  IABP and TTVP now out.  Continued to have VF 7/30 , 7/31, 8/1, 8/3, 8/4, 8/6.    - Ranexa and Mexiletine currently on hold (unable to crush and administer through tube).  - Sotalol not a good option given his history of bradycardia.  - Lidocaine discontinued 8/8 - now on quinidine 150 mg q6h. No VT/VF since addition of quinidine. QT ok - Continue amiodarone gtt at 30 mg/hr.   - Keep K > 4.0 and Mg > 2.0.Stable.  - Avoid inotropes  2. Acute Systolic Heart Failure s/p VAD - EF 95%, RV systolic function mildly reduced - HMIII LVAD placed 06/25/19.  - RV looks good on echo, cannula ok  - Co-ox 75% . On Neo 20 mcg.  - CVP 12. Volume status improving but creatinine trending up. Hold metolazone today. Continue lasix 40 mg IV twice daily.  - avoid inotropes with VF, remains on midodrine 15 mg tid.  - maps 70-80s  - Off  digoxin with bradycardia/AKI.  - Continue Heparin gtt, back on warfarin. INR 1.2.   -LDH stable today at 355 - VAD interrogated personally. Parameters stable. No events.  - on ASA 81 mg daily.   3. Acute hypoxic resp failure - intubated in setting of VF - failing vent wean.  -S/P Trach  - vent management per PCCM.    4. CAD - S/p Acute STEMI 6/21.Emergent cath showed severe multivessel disease w/ 75%  mid- distal LM, 100% prox LAD, not amendable to PCI (unable to cross w/ guide wire, probable CTO), 100% prox LCx (probable CTO) and 100% ostial RCA (probable CTO). There were mildto moderate collaterals right to left to the LAD, minimal right to right collateralsan minimalleft to left collaterals.LVEF 25% -Hs trop peaked 27,000. - No targets for CABG / PCI.  - S/P HMIII VAD - Continue ASA + statin  - No change  5. Renal mass = RCC - MRI abdomen- 1.8 cm right renal lesion, most consistent with renal cell carcinoma. No evidence of renal vein involvement or nodal metastasis. - Discussed w/ Urology and R RCC is small and will just require surveillance. Ok to proceed with VAD from their standpoint.   6. Urinary retention - continue abx - repeat urine culture 8/12 NGTD - currently w/ condom cath but requiring frequent I/O caths  7. ID:  - ?HCAP => Klebsiella in trach aspirate from 7/29.  Sputum now also growing CNS - Completed cefepime 08/02/19, switched to ancef - now on meropenum (started 8/12), Eraxis (start 8/13) and vanc (started 8/14) - Blood CX repeated 08/13/19, NGTD - Afebrile.  - continue current regimen   8. Anemia w/ Concern for GI Source  -mild rectal bleeding 8/8 and blood clot from mouth 8/10. Has had some bloody secretions.  - hgb stable at 8.6  - on heparin anticoagulation for LVAD - EGD 8/11 unremarkable. No bleeding  - transfuse if hgb drops <7.0  9. AKI -overall improved (SCr peaked to 2.13)  - Cr trending up 1.2>1.5>2.1 - Hold metolazone today    10 Hypernatremia -Free water stopped.     Darrick Grinder, NP-C  08/19/2019  7:09 AM   Agree with above.  He underwent trach yesterday. Now on vent through trach. Sedated this am. Renal function continues to worsen. Markedly volume overloaded. Had a brief episode of VT today which terminated spontaneously. Remains on neo.  General:  Ill appearing on vent through trach  HEENT: normal  Neck: supple. JVP to  ear. + trach   Cor: LVAD hum.  Lungs: Coarse Abdomen: obese soft, nontender, non-distended. No hepatosplenomegaly. No bruits or masses. Good bowel sounds. Driveline site clean. Anchor in place.  Extremities: no cyanosis, clubbing, rash. Warm no 3+  edema  Neuro: awake on vent through trach follows commands  Remains critically ill. Has developed ATN with AKI and volume overload. Will add vasopressin. Keep MAPS 80-90. Avoid hypotension. Increase lasix to 80 IV bid. Had recurrent VT today for the first time in a week. Continue quinidine. Keep K > 4.0 Mg > 2.0. Continue heparin. On warfarin. Discussed dosing with PharmD personally.  CRITICAL CARE Performed by: Glori Bickers  Total critical care time: 35 minutes  Critical care time was exclusive of separately billable procedures and treating other patients.  Critical care was necessary to treat or prevent imminent or life-threatening deterioration.  Critical care was time spent personally by me (independent of midlevel providers or residents) on the following activities: development of treatment plan with patient  and/or surrogate as well as nursing, discussions with consultants, evaluation of patient's response to treatment, examination of patient, obtaining history from patient or surrogate, ordering and performing treatments and interventions, ordering and review of laboratory studies, ordering and review of radiographic studies, pulse oximetry and re-evaluation of patient's condition.  Glori Bickers, MD  10:44 PM

## 2019-08-19 NOTE — Progress Notes (Signed)
[place] for Heparin, warfarin (hold) Indication: LVAD  Allergies  Allergen Reactions  . Codeine Hives    Patient Measurements: Height: 5\' 9"  (175.3 cm) Weight: 102.7 kg (226 lb 6.6 oz) IBW/kg (Calculated) : 70.7 Heparin Dosing Weight: ~ 92 kg  Vital Signs: Temp: 100.7 F (38.2 C) (08/18 1521) Temp Source: Oral (08/18 1521) BP: 93/79 (08/18 1500) Pulse Rate: 74 (08/18 1500)  Labs: Recent Labs    08/17/19 0334 08/17/19 0334 08/18/19 0423 08/18/19 0423 08/18/19 1844 08/19/19 0419 08/19/19 1531  HGB 8.7*   < > 8.7*  --   --  8.6*  --   HCT 31.0*  --  31.3*  --   --  30.2*  --   PLT 171  --  164  --   --  162  --   LABPROT 14.2  --  15.4*  --   --  15.2  --   INR 1.1  --  1.3*  --   --  1.2  --   HEPARINUNFRC 0.22*   < > 0.22*   < > 0.13* 0.11* 0.22*  CREATININE 1.23  --  1.53*  --   --  2.08*  --    < > = values in this interval not displayed.    Estimated Creatinine Clearance: 47.4 mL/min (A) (by C-G formula based on SCr of 2.08 mg/dL (H)).  Assessment: 54 yo male s/p LVAD HM3 placement 6/24, followed by IABP 7/8-7/13. 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 IABP removed 8/5, heparin restarted for LVAD. Extubated and reintubated 8/6. Warfarin restarted on 8/12. -heparin level = 0.22   Goal of Therapy:  Heparin level goal 0.2-0.5 w/ LVAD Monitor platelets by anticoagulation protocol: Yes  INR 2-2.5   Plan:  No heparin changes needed Daily heparin level, CBC, INR Monitor for bleeding  Hildred Laser, PharmD Clinical Pharmacist **Pharmacist phone directory can now be found on [email or web address] ([email or web address]).  Listed under [place].

## 2019-08-19 NOTE — Progress Notes (Signed)
LVAD Coordinator Rounding Note:  HM III LVAD implanted on 06/25/19 by Dr. Orvan Seen under Destination Therapy criteria due to recent smoking history.  Pt's transferred to CIR on 07/24/19. Had refractory VF 07/11/2019 and transferred back to [place]. IABP and temporary pacer placed 07/14/2019. Had frequent recurrent VT/VF. IABP and temp pacer d/c 08/04/19. Extubated 8/6. VT x 3, required reintubation. Passed blood clot from mouth & rectum; EGD completed 08/03/2019 with no signs of bleeding; gastritis noted. Poor tolerance of SBTs.     Yesterday diuresed with IV lasix + metolazone. Creatinine trending up 1.5>2. CVP down to 12.   Remains on neo 20 mcg.    Afebrile. WBC 15.1 . Remains on meropenem +vanc   Has klebsiella/CNS in sputum. All other cx negative   VT/VF quiescent on quinidine + amio drip.   Vital signs: Temp: 99.5 HR: 80 Art. Line: 80/70 (75) Cuff BP: 82/73 (77)  Doppler: 76 O2 Sat: 94% on 40% FiO2 5  peep rate 18 TV 560 Wt: 189.6>196.4>200.8>209>....>194.0>191.8>210>217.8>226.4lbs  LVAD parameters: Speed:  5300 Flow: 4.6 Power:  3.8 w PI: 2.6 Hct: 30  Alarms: none Events: none  Fixed speed: 5300 Low speed limit: 5000    Drive Line:  CDI. Next dressing change due Thursday: 2019/09/12.     Labs:  LDH trend: 326>250>...>234>235>238>247>402>326>351>355  INR trend: 2.1>3.5>2.2>1.5>1.5>1.3>1.3>1.3>1.2>1.2>1.2>1.3>1.1>1.3>1.2  WBC trend: 14.0>11.1>11.4>12.0>13.2>13.7 >11.8>11.7>12.7>15.1  Anticoagulation Plan: -INR Goal:  2.0 - 2.5 -ASA Dose: 81 mg - will keep going for CAD for 3 mths stop date 09/25/19  Device: N/A  IABP: - placed 07/04/19 - dc'd 08/06/19  Drips: - Amiodarone 30 mg/hr - Heparin 1350 mg/hr  - Neo 20 mcg/kg/min - Fentanyl 125 mcg/hr - Tube feed 65 ml/hr   Infection: - 07/13/2019 - BCs>>negative - 07/05/2019 - urine culture>>negative - 07/30/19 - respiratory culture>>Klebsiella pneumoniae - 08/04/19 - BCs>> NGTD final -08/09/19- respiratory culture>> Klebsiella  pneumoniae  Arrythmias:  - 07/20/2019 Vfib while in CIR; emergently transferred back to 2H>> DCCV back to NSR; Amio gtt - 07/04/2019 recurrent VF>>increased amio gtt; [place] bolus/gtt; DC-CV 200J to NSR - 07/14/2019 recurrent VF>>8 separate DC-CV throughout night; required pressors for BP support and re-intubation - 07/02/2019 recurrent VT/VF>>7 separate DC-CV throughout am; IABP and pacer urgently placed in Cath Lab - 07/18/2019 recurrent VT/VF>>2 additional DC-CV after IABP placment - 08/01/19 recurrent VT/VF>> pt had 3 episodes of VT/VR with 4 DC-CV - 08/02/19 recurrent VT/VF>>one DC-CV to SR - 08/04/19 recurrent VT/VF>>5 DC-CV to SR - 08/05/19 recurrent VT/VF>> one DC-CV to SR - 08/06/19 recurrent VT/VF>> episode required 3 DC-CV to SR - 08/07/19 recurrent VT/VF>> 3 DCCV to SR  Respiratory:  - intubated 07/20/2019 - trach placed 08/18/19  Plan/Recommendations:  1. Call VAD pager if any VAD equipment or drive line issues. 2. Dressing changes Monday/Thursday  Tanda Rockers RN [place] Coordinator  Office: [phone]  24/7 Pager: [phone]

## 2019-08-20 LAB — BASIC METABOLIC PANEL
Anion gap: 13 (ref 5–15)
BUN: 96 mg/dL — ABNORMAL HIGH (ref 6–20)
CO2: 16 mmol/L — ABNORMAL LOW (ref 22–32)
Calcium: 7.8 mg/dL — ABNORMAL LOW (ref 8.9–10.3)
Chloride: 109 mmol/L (ref 98–111)
Creatinine, Ser: 3 mg/dL — ABNORMAL HIGH (ref 0.61–1.24)
GFR calc Af Amer: 26 mL/min — ABNORMAL LOW (ref >60)
GFR calc non Af Amer: 22 mL/min — ABNORMAL LOW (ref >60)
Glucose, Bld: 179 mg/dL — ABNORMAL HIGH (ref 70–99)
Potassium: 6 mmol/L — ABNORMAL HIGH (ref 3.5–5.1)
Sodium: 138 mmol/L (ref 135–145)

## 2019-08-20 LAB — GLUCOSE, CAPILLARY
Glucose-Capillary: 122 mg/dL — ABNORMAL HIGH (ref 70–99)
Glucose-Capillary: 134 mg/dL — ABNORMAL HIGH (ref 70–99)
Glucose-Capillary: 136 mg/dL — ABNORMAL HIGH (ref 70–99)

## 2019-08-20 LAB — CBC
HCT: 31 % — ABNORMAL LOW (ref 39.0–52.0)
Hemoglobin: 8.6 g/dL — ABNORMAL LOW (ref 13.0–17.0)
MCH: 28.8 pg (ref 26.0–34.0)
MCHC: 27.7 g/dL — ABNORMAL LOW (ref 30.0–36.0)
MCV: 103.7 fL — ABNORMAL HIGH (ref 80.0–100.0)
Platelets: 205 10*3/uL (ref 150–400)
RBC: 2.99 MIL/uL — ABNORMAL LOW (ref 4.22–5.81)
RDW: 19.5 % — ABNORMAL HIGH (ref 11.5–15.5)
WBC: 18 10*3/uL — ABNORMAL HIGH (ref 4.0–10.5)
nRBC: 0 % (ref 0.0–0.2)

## 2019-08-20 LAB — COOXEMETRY PANEL
Carboxyhemoglobin: 1.8 % — ABNORMAL HIGH (ref 0.5–1.5)
Methemoglobin: 1.1 % (ref 0.0–1.5)
O2 Saturation: 78.9 %
Total hemoglobin: 9.2 g/dL — ABNORMAL LOW (ref 12.0–16.0)

## 2019-08-20 LAB — MAGNESIUM: Magnesium: 2.4 mg/dL (ref 1.7–2.4)

## 2019-08-20 LAB — HEPARIN LEVEL (UNFRACTIONATED): Heparin Unfractionated: 0.24 IU/mL — ABNORMAL LOW (ref 0.30–0.70)

## 2019-08-20 LAB — LACTATE DEHYDROGENASE: LDH: 410 U/L — ABNORMAL HIGH (ref 98–192)

## 2019-08-20 LAB — PROTIME-INR
INR: 1.3 — ABNORMAL HIGH (ref 0.8–1.2)
Prothrombin Time: 15.5 seconds — ABNORMAL HIGH (ref 11.4–15.2)

## 2019-08-20 MED ORDER — SODIUM ZIRCONIUM CYCLOSILICATE 10 G PO PACK
10.0000 g | PACK | Freq: Once | ORAL | Status: AC
  Administered 2019-08-20: 10 g
  Filled 2019-08-20: qty 1

## 2019-08-20 MED ORDER — NOREPINEPHRINE 16 MG/250ML-% IV SOLN
0.0000 ug/min | INTRAVENOUS | Status: DC
  Administered 2019-08-20: 20 ug/min via INTRAVENOUS
  Filled 2019-08-20: qty 250

## 2019-08-20 MED ORDER — MIDAZOLAM HCL 2 MG/2ML IJ SOLN
INTRAMUSCULAR | Status: AC
  Administered 2019-08-20: 4 mg
  Filled 2019-08-20: qty 4

## 2019-08-20 MED ORDER — VANCOMYCIN VARIABLE DOSE PER UNSTABLE RENAL FUNCTION (PHARMACIST DOSING): Status: DC

## 2019-08-20 MED ORDER — MIDAZOLAM HCL 2 MG/2ML IJ SOLN: 4.0000 mg | Freq: Once | INTRAMUSCULAR | Status: DC

## 2019-08-20 MED ORDER — CALCIUM CHLORIDE 10 % IV SOLN: 1.0000 g | Freq: Once | INTRAVENOUS | Status: AC

## 2019-08-20 MED ORDER — SODIUM ZIRCONIUM CYCLOSILICATE 10 G PO PACK
10.0000 g | PACK | Freq: Every day | ORAL | Status: DC
  Filled 2019-08-20: qty 1

## 2019-08-20 MED ORDER — CALCIUM CHLORIDE 10 % IV SOLN
INTRAVENOUS | Status: AC
  Administered 2019-08-20: 1 g via INTRAVENOUS
  Filled 2019-08-20: qty 10

## 2019-08-20 MED ORDER — WARFARIN SODIUM 2 MG PO TABS: 4.0000 mg | ORAL_TABLET | Freq: Once | ORAL | Status: DC

## 2019-08-20 MED FILL — Medication: Qty: 1 | Status: AC

## 2019-09-02 NOTE — Discharge Summary (Signed)
Advanced Heart Failure Death Summary  Death Summary   Patient ID: Aaron [name] MRN: [id number], DOB/AGE: [birth year] 54 y.o. Admit date: 07/21/2019 D/C date:     08/27/19   Primary Discharge Diagnoses:  Refractory Ventricular Fibrillation  Cardiogenic Shock -->Acute Systolic Heart Failure   S/P VAD  Acute Hypoxic Respiratory Failure CAD Renal Cell Carcinoma  Urinary Retention  ID  Anemia with concern for GI source AKI  Hypernatremia  Hyperkalemia     Hospital Course:   Mr Schertzer was a 54 year old with a history of recent anterolateral STEMI and cardiogenic shock requiring mechanical support w/ IABP. Cath showed severe multivessel CAD not amendable to PCI. Also felt to be poor candidate for CABG given poor targets. Echo showed severe LV dysfunction, EF <20%, RV mildly reduced. On 06/2419 he underwent LVAD under DT criteria. Post operative course was complicated by refractory VT/VF/Torsades requiring multiple cardioversions. Once he improved he was discharged to inpatient rehab on 07/24/19.   Readmitted to ICU from inpatient rehab on 07/27/2019 with refractory V Fib requiring cardioversion and intubation.  EP consulted and he was placed on amiodarone drip but continued to have ongoing episodes VF with bradycardia. Antiarrhythmic options adjusted per EP and had IABP + TTVP placed. IABP/TTVP removed several days later.  He had multiple episodes of VF on 7/27, 7/30 , 7/31, 8/1, 8/3, 8/4, 8/6, and 8/19 requiring multiple shocks.  He remained intubated and was trached on  08/18/19 but was not able to be weaned from the ventilator. He developed AKI and had marked volume overload. Due to hypotension pressors increased but developed recurrent VF on 8/19 with shock x1 followed by asystole. Briefly went in sinus brady then had recurrent VF. Dr Haroldine Laws had multiple conversations with his sister throughout his hospital course. On  08-27-2019 she was contacted due to worsening condition and she requested  no further treatment. LVAD disconnected 2019/08/27 at 09:21.   Hospital complicated by VF, respiratory failure, and MSOF.   1. Refractory Ventricular Fibrillation  -multiple episodes ~10 of VF on 7/28 - multiple episodes 7/30 am => IABP placed as well as TTVP given relative bradycardia.  IABP and TTVP now out.  Continued to have VF 7/30 , 7/31, 8/1, 8/3, 8/4, 8/6, 8/19.    2. Cardiogenic Shock-->Acute Systolic Heart Failure s/p VAD - EF 37%, RV systolic function mildly reduced. HMIII LVAD placed 06/25/19.  - On Vasopressin+ neo+ norepi.  3. Acute hypoxic resp failure - intubated in setting of VF. CCM consulted.  - failing vent wean and had trach on 08/18/19 4. CAD - S/p Acute STEMI 6/21.Emergent cath showed severe multivessel disease w/ 75% mid- distal LM, 100% prox LAD, not amendable to PCI (unable to cross w/ guide wire, probable CTO), 100% prox LCx (probable CTO) and 100% ostial RCA (probable CTO). There were mildto moderate collaterals right to left to the LAD, minimal right to right collateralsan minimalleft to left collaterals.LVEF 25% -Hs trop peaked 27,000. - No targets for CABG / PCI.  - S/P HMIII VAD 5. Renal mass = RCC - MRI abdomen- 1.8 cm right renal lesion, most consistent with renal cell carcinoma. No evidence of renal vein involvement or nodal metastasis. - Discussed w/ Urology and R RCC is small and will just require surveillance. Ok to proceed with VAD from their standpoint.  6. Urinary retention - continue abx - repeat urine culture 8/12 NGTD 7. ID:  - ?HCAP => Klebsiella in trach aspirate from 7/29.  Sputum now  also growing CNS - Completed cefepime 08/02/19, switched to ancef - Placed on  meropenum (started 8/12), Eraxis (start 8/13- 8/16) and vanc (started 8/14-8/18) - Blood CX repeated 08/13/19, NGTD 8. Anemia w/ Concern for GI Source  GI consulted. Mild rectal bleeding 8/8 and blood clot from mouth 8/10. Has had some bloody secretions.  8/11  unremarkable. No bleeding  9. AKI Now with AKI in the setting hypotension.  Creatinine up to 3 and K 6 with minimal urine output.  10 Hypernatremia -Free water stopped. 11. Hyperkalemia  - K 6--> given Lokelma    Consultations  CCM  GI EP   Duration of Discharge Encounter: Greater than 35 minutes   Signed, Darrick Grinder, NP 09/15/19, 9:31 AM

## 2019-09-02 NOTE — Progress Notes (Signed)
Chaplain offered support to Mrs. Massiah at Mccormick's bedside.  She shared with chaplain about Hikaru, their family and their life together.  Chaplain learned that Chadrick was a very meticulous and hardworking man.  Brix was great with his hands, was a Building surveyor and was great at understanding how things worked.  Mrs. Errickson noted that all of their sons are a lot like Jahsir in regard to their talents and physical appearance. Chaplain celebrated and affirmed the life the Coach have been able to build and lead.   Chaplain offered the ministries of presence and listening.  Chaplain is available to provide support as needed.

## 2019-09-02 NOTE — Progress Notes (Signed)
Electrophysiology Rounding Note  Patient Name: Aaron [name] Date of Encounter: Sep 04, 2019  Primary Cardiologist: Dr. Haroldine [name]  Electrophysiologist: Dr. Rayann [name]  Subjective   On vent through trach (8/17)  Short VF that spontaneously resolved yesterday am, and then recurrent this am requiring shock.   He has had marked decrease of UOP with sharp rise in creatinine. K 6.0 this am.   Inpatient Medications    Scheduled Meds: . alteplase  2 mg Intracatheter Once  . aspirin  81 mg Per Tube Daily  . atorvastatin  80 mg Per Tube Daily  . bisacodyl  10 mg Rectal Once  . chlorhexidine gluconate (MEDLINE KIT)  15 mL Mouth Rinse BID  . Chlorhexidine Gluconate Cloth  6 each Topical Daily  . clonazePAM  1 mg Per Tube BID  . docusate  100 mg Per Tube BID  . feeding supplement (PROSource TF)  90 mL Per Tube BID  . fentaNYL (SUBLIMAZE) injection  200 mcg Intravenous Once  . folic acid  1 mg Per Tube Daily  . furosemide  80 mg Intravenous BID  . gabapentin  300 mg Per Tube Q8H  . insulin aspart  0-15 Units Subcutaneous Q4H  . insulin aspart  3 Units Subcutaneous Q4H  . mouth rinse  15 mL Mouth Rinse 10 times per day  . midodrine  15 mg Per Tube TID WC  . multivitamin  15 mL Per Tube Daily  . oxybutynin  5 mg Per Tube BID  . pantoprazole (PROTONIX) IV  40 mg Intravenous Q12H  . polyethylene glycol  17 g Per Tube Daily  . quiNIDine sulfate  150 mg Per Tube Q6H  . sildenafil  20 mg Per Tube TID  . sodium chloride flush  10-40 mL Intracatheter Q12H  . sodium zirconium cyclosilicate  10 g Per Tube Once  . thiamine  100 mg Per Tube Daily  . Warfarin - Pharmacist Dosing Inpatient   Does not apply q1600   Continuous Infusions: . sodium chloride    . sodium chloride 10 mL/hr at 08/15/2019 1100  . sodium chloride 20 mL/hr at 08/01/19 1700  . amiodarone 30 mg/hr (09/04/2019 0600)  . dexmedetomidine (PRECEDEX) IV infusion Stopped (08/16/19 1145)  . feeding supplement (VITAL 1.5 CAL) 1,000 mL  (08/18/19 1158)  . fentaNYL infusion INTRAVENOUS 100 mcg/hr (2019/09/04 0600)  . heparin 1,350 Units/hr (Sep 04, 2019 0600)  . meropenem (MERREM) IV Stopped (08/19/19 1840)  . phenylephrine (NEO-SYNEPHRINE) Adult infusion 60 mcg/min (September 04, 2019 0600)  . vancomycin Stopped (08/19/19 2332)  . vasopressin 0.03 Units/min (Sep 04, 2019 0600)   PRN Meds: Place/Maintain arterial line **AND** sodium chloride, sodium chloride, acetaminophen, fentaNYL, midazolam, sodium chloride flush   Vital Signs    Vitals:   September 04, 2019 0500 2019/09/04 0515 09-04-19 0530 09-04-19 0545  BP: 92/78  (!) 84/71   Pulse: 66 67 65 67  Resp: _0 Temp: 98.8 F (37.1 C)     TempSrc: Axillary     SpO2: 98% 98% 98% 98%  Weight:      Height:        Intake/Output Summary (Last 24 hours) at 04-Sep-2019 0733 Last data filed at 09-04-19 0600 Gross per 24 hour  Intake 2086.34 ml  Output 370 ml  Net 1716.34 ml   Filed Weights   08/17/19 0500 08/18/19 0500 08/19/19 0500  Weight: 95.3 kg 98.8 kg 102.7 kg    Physical Exam    GEN- The patient is on vent through trach. Responds to voice.  HEENT- + vent through trach Lungs- +mechanical breathing sounds.  Heart- Regular rate and rhythm, no murmurs, rubs or gallops GI- soft Extremities- no clubbing or cyanosis. Dependent edema. Skin- no rash or lesion Neuro- vent through trach.   Labs    CBC Recent Labs    08/19/19 0419 09/18/2019 0458  WBC 15.1* 18.0*  HGB 8.6* 8.6*  HCT 30.2* 31.0*  MCV 103.4* 103.7*  PLT 162 213   Basic Metabolic Panel Recent Labs    08/19/19 0419 09-18-2019 0458  NA 138 138  K 4.9 6.0*  CL 109 109  CO2 16* 16*  GLUCOSE 208* 179*  BUN 71* 96*  CREATININE 2.08* 3.00*  CALCIUM 8.1* 7.8*  MG 2.2 2.4   Liver Function Tests No results for input(s): AST, ALT, ALKPHOS, BILITOT, PROT, ALBUMIN in the last 72 hours. No results for input(s): LIPASE, AMYLASE in the last 72 hours. Cardiac Enzymes No results for input(s): CKTOTAL, CKMB,  CKMBINDEX, TROPONINI in the last 72 hours.   Telemetry    Sinus brady to NSR 40-70s VF yesterday am just after 1030 (resolved spontaneously) and again this am requiring shock (personally reviewed)  Radiology    DG CHEST PORT 1 VIEW  Result Date: 08/19/2019 CLINICAL DATA:  Respiratory failure EXAM: PORTABLE CHEST 1 VIEW COMPARISON:  August 18, 2019 FINDINGS: Tracheostomy catheter tip is 4.8 cm above the carina. Feeding tube tip below the diaphragm. Peripherally inserted right central catheter tip is in the superior vena cava. No pneumothorax. There is a left ventricular assist device present. There is diffuse interstitial and patchy alveolar opacity with mild consolidation in the left base. There is a small left pleural effusion. Heart is enlarged with pulmonary venous hypertension. No adenopathy. Status post median sternotomy. IMPRESSION: Tube and catheter positions as described without pneumothorax. Left ventricular assist device present. There is cardiomegaly with pulmonary vascular congestion. Suspect interstitial and patchy alveolar edema. Superimposed pneumonia in the left base cannot be excluded. Small left pleural effusion. The overall appearance is felt to be most indicative of congestive heart failure. Electronically Signed   By: Lowella Grip III M.D.   On: 08/19/2019 09:38   DG CHEST PORT 1 VIEW  Result Date: 08/18/2019 CLINICAL DATA:  LVAD.  Tracheostomy placement EXAM: PORTABLE CHEST 1 VIEW COMPARISON:  Yesterday FINDINGS: Tracheostomy tube in place. Feeding tube which at least reaches the diaphragm. LVAD. Right PICC with tip in stable good position. Unchanged generalized interstitial and airspace opacity which may be failure or infection. No evidence of air leak. Stable cardiomegaly IMPRESSION: 1. New tracheostomy tube without complicating feature. 2. Otherwise stable chest. Electronically Signed   By: Monte Fantasia M.D.   On: 08/18/2019 11:22    Patient Profile     Aaron [name]  [name] a 54 y.o.malewith a past medical history significant for CAD and recent LVAD placement. Post op course complicated by incessant VT. Quieted and transferred to CIR where he had recurrent VF. Re-admitted to ICU.  - Had refractory VF throughout the night 7/28 w/ ~10 shocks. Now intubated.  - Developed bradycardia and further VF 7/30 am, TTVP + IABP placed 7/30 am.  - 8/1 completed cefepime, switched to ancef. Had VF 08/02/19 shock x1  - 8/3 Echo ramp, increased speed to 5400. VF x3. Failed IABP wean. Speed decreased to 5300.  - 08/05/19 VF shock x1 - 8/5 IABP removed. VT shock x 3 when sedation weaned and patient agitated.  - 8/6 Extubated, VT with shock x 3, reintubated. - 8/17  Trach - 8/18 short VF in am that resolved spontaneously - 8/19 am VF requiring shock.   Assessment & Plan    1. Refractory VF Pt now with worsening and recurrent VF yesterday and this am despite IV amio and quinidine 150 mg q 6 hrs. No BB with BP and shock. EKG pending. Last on 8/17 showed QTc ~470 when corrected for QRS (or even less). PR interval stable around 250-260 ms.  He has worsened and have no additional AAD options. Hemodynamics and renal function worsening.   2. Acute hypoxic respiratory failure in setting of recurrent VF/VT Tmax 100.7 ABX per CCM and primary. +Klebsiella HAP ->Narrowed from Zosyn to ceftriaxone 8/11 ->Meropenem 8/12 with recurrent fever. Fungal coverage started as well.  + Klebsiella/CNS in sputum. All other Cx negative. CCM following.  3. ARF Decreasing UOP and worsening creatinine over past 2 days.   Prognosis increasingly poor at this point. Dr. Rayann [name] to see, but from previous discussions he does not have any other AAD options. His PR interval and QTc have been borderline at times on quinidine, so likely no room to increase this either.   For questions or updates, please contact [place] Please consult [email or web address] for contact info under  Cardiology/STEMI.  Signed, Shirley Friar, PA-C  2019-09-01, 7:33 AM

## 2019-09-02 NOTE — Progress Notes (Signed)
Expiration Note;  Dr. Haroldine Laws at bedside for LVAD disconnect (See MD Note). Patient expired with MD, RNs Berniece Salines and Beckie Salts and other support staff at bedside at [phone].  [facility] notified of expiration  (Ref # [id number]).  Family in route.

## 2019-09-02 NOTE — Progress Notes (Signed)
NAME:  JUN OSMENT, MRN:  [id number], DOB:  [birth year], LOS: 41 ADMISSION DATE:  07/22/2019, CONSULTATION DATE:  07/30/19 REFERRING MD:  Heart Failure, CHIEF COMPLAINT:  Respiratory failure -- VF, LVAD   Brief History   54 yo PMH acute systolic heart failure s/p LVAD (06/25/19), recurrent VT/VF/Torsades, multivessel CAD, R renal mass c/f RCC, suspected OSA, who presents back to ICU after prolonged hospital course after which he was transferred to [facility] 7/23. At CIR, pt developed dyspnea, hypoxia following PT 7/27. ECG obtained and reveals VF rate 210 bpm. Transferred to CVICU 7/27 for emergent DCCV. Intubated by anesthesia. DCCV performed numerous times 7/27, 7/28. Remains intubated requiring frequent cardioversion.  PCCM consulted 7/29 for vent management   **ADDENDUM** 09:28 Mr. Yabut went into VF, met with congestive heart failure team at bedside, and cardioversion was attempted. Dr. Haroldine Laws spoke with Mr. Turton family and family agreed to go full comfort care.  Time of Death: 09:21  Past Medical History  Acute systolic heart failure s/p LVAD CAD Recurrent VF, VT, torsades  Renal mass Debility Sleep apnea   Significant Hospital Events   6/16 admitted with acute anterolateral STEMI, cardiogenic shock requiring IABP 6/25 LVAD placement 7/03 VT/VT/Torsades requiring multiple emergent DCCV  7/06 recurrent VT/VT/Torsades requiring multiple emergent DCCV  7/07 recurrent VT/VT/Torsades requiring multiple emergent DCCV  7/08 PNA, respiratory failure, required intubation 7/13 IABP removed, extubated 7/23 discharred to CIR 7/27 recurrent VF, admit to CVICU for DCCV. Intubated 7/28 recurrent VF, DCCV multiple times 7/29 PCCM consult for vent  7/30 vt overnight defib x2 7/31 DCCV x2, improved on versed / no further shocks 7/31>> present, intermittent vtach/shocks correlating with agitation 8/6 failed extubation trial 8/12 fever 8/13 Tolerated 2 hours trach collar however was  re-sedated for reported anxiety and distress 8/14 Unable to tolerate PS due to tachypnea  8/16: Off Neo-Synephrine. Fever curve fairly stable white blood cell count coming down volume overloaded. Resuming low-dose diuresis. Tolerating pressure support of 10 however 2 weeks for extubation 8/17: s/p Trach 8/18> short duration VF, spontaneously resolved.  8/19> VF episode requiring 1 shock Consults:  EP PCCM  Procedures:  ETT 7/27 >8/6> exchanged 8/11 (cuff leak ) >> Art line 7/28 >> RUE PICC 6/28 >>  R Femoral Venous Sheath 7/30 >> out IABP 7/30 >> out 8/17> Tracheostomy   Significant Diagnostic Tests:  CT Chest 07/09/19 - 1. Interval placement LVAD with loculated appearing fluid in the anterior mediastinum and surrounding the outflow graft. This is nonspecific and although may represent postsurgical seroma, infection is not excluded. Clinical correlation is recommended. 2. Ill-defined faint high attenuating content above the aortic valve leaflets suboptimally characterized. Blood clot is not excluded. 3. Small bilateral pleural effusions and findings of asymmetric edema. Pneumonia is not excluded. 4. No bowel obstruction. Normal appendix. 5. A 4 mm nonobstructing right renal upper pole calculus. No hydronephrosis. 6. Aortic Atherosclerosis (ICD10-I70.0).  EGD 8/11 neg  Bscopy 8/11 >> for hemoptysis, mucus mixed with blood especially right-sided airways, stringy endobronchial mucoid masslike lesion right middle lobe, could not be fully suctioned out Micro Data:  resp 7/29 >> klebsiella resp 8/8 >>>klebs >> R to ampi/ unasyn BAL Cx 8/11>>3K Klebsiella, coag neg staph 8/12 blood >>NG 8/12 urine>>NG   Antimicrobials:  Cefepime 7/28-8/1 Ancef 8/1 -8/3 Zosyn 8/8 >> 8/11 ceftx 8/11 >> 8/12 Meropenem 8/12 >> Vanc 8/14> Interim history/subjective:  Patient seen at bedside this AM. Had an episode of VF at the beginning of the shift that required 1  shock to bring him to NSR.  Responding to voice.  Objective   Blood pressure (!) 84/71, pulse 67, temperature 98.8 F (37.1 C), temperature source Axillary, resp. rate 19, height _0  (1.753 m), weight 102.7 kg, SpO2 98 %. CVP:  [13 mmHg-22 mmHg] 22 mmHg  Vent Mode: PRVC FiO2 (%):  [40 %] 40 % Set Rate:  [18 bmp] 18 bmp Vt Set:  [560 mL] 560 mL PEEP:  [5 cmH20] 5 cmH20 Pressure Support:  [8 cmH20] 8 cmH20 Plateau Pressure:  [18 cmH20-29 cmH20] 23 cmH20   Intake/Output Summary (Last 24 hours) at August 22, 2019 0703 Last data filed at 08-22-19 0600 Gross per 24 hour  Intake 2086.34 ml  Output 370 ml  Net 1716.34 ml   Filed Weights   08/17/19 0500 08/18/19 0500 08/19/19 0500  Weight: 95.3 kg 98.8 kg 102.7 kg   Physical Exam: Physical Exam Vitals and nursing note reviewed.  Constitutional:      General: He is not in acute distress.    Appearance: He is ill-appearing.     Comments: opens eyes to voice.   Neck:     Comments: Trach tube in place, no leaking, purulence, erythema noted at site of insertion.  Cardiovascular:     Rate and Rhythm: Normal rate and regular rhythm.     Pulses: Normal pulses.     Heart sounds: Normal heart sounds. No murmur heard.  No friction rub. No gallop.   Musculoskeletal:     Comments: 1+ pitting edema in lower extremities bilaterally, upper extremities bilaterally 1+ edema.   Skin:    General: Skin is warm.      Resolved Hospital Problem list   Sepsis due to UTI, Klebsiella Pneumoniae PNA-cefazolin, complete 7 days total (8/3 end date) Acute blood loss anemia - EGD neg Hypernatremia  Assessment & Plan:  54 year old s/p LVAD in 06/2019. Admitted 7/27 for VF requiring multiple DCCV and intubation. Failed extubation 8/6. Difficulty weaning due to agitation and weakness and may eventually require trach. Worsening respiratory status in the last 48 hours, now on low-dose pressors for septic shock secondary to presumed pneumonia. Previously on Zosyn>Ceftriaxone x 4d for  Klebsiella however re-broadened to Meropenem for for new onset fevers.  Acute Hypoxic Respiratory Failure Likely 2/2 to Klebsiella and CoN Staph HCAP Initially intubated on 7/27, and again re-intubated on 8/6 after failed extubation. Major barrier to extubation at this point is profound deconditioning following prolonged critical illness as well as element of delirium.  Leukocytosis 18 today from 15, Tmax 100.7 overnight.  Plan - Day #8 of meropenem, day #6 of vancomycin to complete 14 day course - PAD protocol RASS goal 0-1 (Intolerant to Precedex due to bradycardia)  - Continue VAP   Hx renal mass, worrisome for right renal cell carcinoma - Observation in regards to renal cell mass, work up in outpatient setting.   Acute metabolic encephalopathy Plan - Currently on fentanyl infusion, scheduled clonazepam taper as needed Versed for taper.  Acute systolic heart failure s/p LVAD placement (6/64/40); complicated by septic shock from HCAP - On Neo 60 vaso added back yesterday on 0.03 units, wean as tolerated.  - Continue midodrine 15 mg via tube TID - Additional Per heart failure team recommendations/interventions per cardiology  Recurrent VFib/Vtach requiring repeated DCCV:  Had one episode of VF on 8/18, but self terminated. - Continue quinidine and amiodarone per electrophysiology - Holding beta-blockade per heart failure team - Replete potassium to > 4 and magnesium > 2  -  Continue telemetry monitoring  - Holding inotropic agents  - Watching QTC   AKI, likely multifactorial 2/2 to hypoperfuson from shock and/or cardiorenal, medication.  sCr continues to climb with a Cr of 3 today, oliguric. Hyperkalemic with K of 6.0 today. May warrant discussing with nephrology, as patient appears hypervolemic on examination.  - Agree with nephrology consult per primary.   Coagulopathy, on warfarin PTA - Continuing Heparin, and can resume warfarin or oral therapy as directed by primary team  At  Risk Malnutrition in setting of Critical Illness Plan  He will need prolonged tube feeds and likely PEG tube  Best practice:  Diet: TF  Pain/Anxiety/Delirium protocol (if indicated): As above VAP protocol (if indicated): yes DVT prophylaxis: heparin per pharmacy  GI prophylaxis: protonix  Glucose control: SSI Mobility: BR Code Status: Comfort Care Family Communication: Per primary Disposition: ICU    Maudie Mercury, MD IMTS, PGY-2 2019/08/25,7:03 AM

## 2019-09-02 NOTE — Progress Notes (Signed)
  Patient developed recurrent VF and hypotension. Shock x 1.   Developed asystole then eventually sinus brady for a minute or so then recurrent fine VF.  I spoke with his sister and we discussed the fact that he had no meaningful chance of survival.   His VAD was disconnected and he passed at 921a.   His sister was notified.   Additional 22min CCT.   Glori Bickers, MD  9:25 AM

## 2019-09-02 NOTE — Progress Notes (Signed)
[place] for Heparin, warfarin (hold) Indication: LVAD  Allergies  Allergen Reactions  . Codeine Hives    Patient Measurements: Height: 5\' 9"  (175.3 cm) Weight: 102.7 kg (226 lb 6.6 oz) IBW/kg (Calculated) : 70.7 Heparin Dosing Weight: ~ 92 kg  Vital Signs: Temp: 98.8 F (37.1 C) (08/19 0500) Temp Source: Axillary (08/19 0500) BP: 84/71 (08/19 0530) Pulse Rate: 67 (08/19 0545)  Labs: Recent Labs    08/18/19 0423 08/18/19 1844 08/19/19 0419 08/19/19 1531 12-Sep-2019 0458 09/12/2019 0459  HGB 8.7*  --  8.6*  --  8.6*  --   HCT 31.3*  --  30.2*  --  31.0*  --   PLT 164  --  162  --  205  --   LABPROT 15.4*  --  15.2  --  15.5*  --   INR 1.3*  --  1.2  --  1.3*  --   HEPARINUNFRC 0.22*   < > 0.11* 0.22*  --  0.24*  CREATININE 1.53*  --  2.08*  --  3.00*  --    < > = values in this interval not displayed.    Estimated Creatinine Clearance: 32.9 mL/min (A) (by C-G formula based on SCr of 3 mg/dL (H)).  Assessment: 54 yo male s/p LVAD HM3 placement 6/24, followed by IABP 7/8-7/13. 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 IABP removed 8/5, heparin restarted for LVAD. Extubated and reintubated 8/6. Warfarin restarted on 8/12. -heparin level = 0.24 -INR up to 1.3   Goal of Therapy:  Heparin level goal 0.2-0.5 w/ LVAD Monitor platelets by anticoagulation protocol: Yes  INR 2-2.5   Plan:  No heparin changes needed Warfarin 4mg  tonight Daily heparin level, CBC, INR Monitor for bleeding  Erin Hearing PharmD., BCPS Clinical Pharmacist 2019-09-12 7:32 AM

## 2019-09-02 NOTE — Progress Notes (Addendum)
Patient ID: Aaron [name], male   DOB: 1965/06/07, 54 y.o.   MRN: [id number]   Advanced Heart Failure VAD Team Note  PCP-Cardiologist: No primary care provider on file.   Subjective:    - Had refractory VF throughout the night 7/28 w/ ~10 shocks. Now intubated.  - Developed bradycardia and further VF 7/30 am, TTVP + IABP placed 7/30 am.   - 8/1 completed cefepime, switched to ancef. Had VF 08/02/19 shock x1  - 8/3 Echo ramp, increased speed to 5400. VF x3. Failed IABP wean. Speed decreased to 5300.   - 08/05/19 VF shock x1 - 8/5 IABP removed.  VT shock x 3 when sedation weaned and patient agitated.  - 8/6 Extubated, VT with shock x 3, reintubated.  -8/8 Lidocaine stopped. Quinidine started  -8/11 EGD negative for bleed. + gastritis  -8/12 started on meropenum for persistent fevers and worsening leukocytosis  - 8/13 started Eraxis (stopped 8/16) - 8/17 S/P Trach  - 8/19 VF shock x1   Yesterday diuresed with IV lasix.  Sluggish urine output. Weight up another 9 pounds. Creatinine trending up 1.5>2>3 . CVP up 20.   Remains on neo + vasopressin.     Afebrile. WBC 15.1 >18. Remains on meropenem.   Has klebsiella/CNS in sputum. All other cx negative   VF 8/18 self terminated. In VF this morning-->shock x1 200 J   Remains on quinidine + amio drip.    Co-ox 75%   LDH 355>410  LVAD INTERROGATION:  HeartMate 3 LVAD:   Flow 4.6 liters/min, speed 5300, power 4, PI 2.3.  No PI events. VAD interrogated personally. Parameters stable.  Objective:    Vital Signs:   Temp:  [97.5 F (36.4 C)-100.7 F (38.2 C)] 98.8 F (37.1 C) (08/19 0500) Pulse Rate:  [32-83] 74 (08/18 1500) Resp:  [6-21] 20 (08/18 1930) BP: (80-97)/(69-84) 80/70 (08/18 1930) SpO2:  [90 %-100 %] 99 % (08/19 0334) Arterial Line BP: (76-85)/(68-72) 80/70 (08/18 0815) FiO2 (%):  [40 %] 40 % (08/19 0334) Last BM Date: 08/19/19 Mean arterial Pressure 50-70s   Intake/Output:   Intake/Output Summary (Last 24 hours) at  2019-09-12 0553 Last data filed at 2019/09/12 0000 Gross per 24 hour  Intake 2966.07 ml  Output 660 ml  Net 2306.07 ml     Physical Exam  CVP 19-20  Physical Exam: GENERAL: Sedated on Vent through trach. No acute distress. HEENT: normal  NECK: Trach, Supple, JVP to jaw  2+ bilaterally, no bruits.  No lymphadenopathy or thyromegaly appreciated.   CARDIAC:  Mechanical heart sounds with LVAD hum present.  LUNGS:  Rhonchi throughout.  ABDOMEN:  Soft, round, nontender, positive bowel sounds x4.     LVAD exit site:  No erythema or drainage.  Stabilization device present and accurately applied.  Driveline dressing is being changed daily per sterile technique. EXTREMITIES:  Warm and dry, no cyanosis, clubbing, rash. R and LLE 2+ edema . RUE a line.  NEUROLOGIC:  Sedated on vent.    Telemetry   Sinus Brady - VF this morning.   Labs   Basic Metabolic Panel: Recent Labs  Lab 08/16/19 0323 08/16/19 0323 08/17/19 0334 08/17/19 0334 08/18/19 0423 08/19/19 0419 09-12-19 0458  NA 145  --  138  --  139 138 138  K 4.2  --  4.4  --  4.7 4.9 6.0*  CL 116*  --  108  --  110 109 109  CO2 20*  --  19*  --  18*  16* 16*  GLUCOSE 152*  --  183*  --  192* 208* 179*  BUN 49*  --  45*  --  54* 71* 96*  CREATININE 1.31*  --  1.23  --  1.53* 2.08* 3.00*  CALCIUM 8.2*   < > 8.2*   < > 8.3* 8.1* 7.8*  MG 1.9  --  2.0  --  2.3 2.2 2.4   < > = values in this interval not displayed.    Liver Function Tests: Recent Labs  Lab 08/14/19 0443  AST 219*  ALT 144*  ALKPHOS 81  BILITOT 0.8  PROT 6.3*  ALBUMIN 2.2*   No results for input(s): LIPASE, AMYLASE in the last 168 hours. No results for input(s): AMMONIA in the last 168 hours.  CBC: Recent Labs  Lab 08/16/19 0323 08/17/19 0334 08/18/19 0423 08/19/19 0419 2019-08-26 0458  WBC 13.1* 11.7* 12.7* 15.1* 18.0*  HGB 9.1* 8.7* 8.7* 8.6* 8.6*  HCT 32.6* 31.0* 31.3* 30.2* 31.0*  MCV 100.3* 100.3* 102.3* 103.4* 103.7*  PLT 214 171 164 162 205     INR: Recent Labs  Lab 08/16/19 0323 08/17/19 0334 08/18/19 0423 08/19/19 0419 08/26/2019 0458  INR 1.3* 1.1 1.3* 1.2 1.3*    Other results:    Imaging   DG CHEST PORT 1 VIEW  Result Date: 08/19/2019 CLINICAL DATA:  Respiratory failure EXAM: PORTABLE CHEST 1 VIEW COMPARISON:  August 18, 2019 FINDINGS: Tracheostomy catheter tip is 4.8 cm above the carina. Feeding tube tip below the diaphragm. Peripherally inserted right central catheter tip is in the superior vena cava. No pneumothorax. There is a left ventricular assist device present. There is diffuse interstitial and patchy alveolar opacity with mild consolidation in the left base. There is a small left pleural effusion. Heart is enlarged with pulmonary venous hypertension. No adenopathy. Status post median sternotomy. IMPRESSION: Tube and catheter positions as described without pneumothorax. Left ventricular assist device present. There is cardiomegaly with pulmonary vascular congestion. Suspect interstitial and patchy alveolar edema. Superimposed pneumonia in the left base cannot be excluded. Small left pleural effusion. The overall appearance is felt to be most indicative of congestive heart failure. Electronically Signed   By: Lowella Grip III M.D.   On: 08/19/2019 09:38   DG CHEST PORT 1 VIEW  Result Date: 08/18/2019 CLINICAL DATA:  LVAD.  Tracheostomy placement EXAM: PORTABLE CHEST 1 VIEW COMPARISON:  Yesterday FINDINGS: Tracheostomy tube in place. Feeding tube which at least reaches the diaphragm. LVAD. Right PICC with tip in stable good position. Unchanged generalized interstitial and airspace opacity which may be failure or infection. No evidence of air leak. Stable cardiomegaly IMPRESSION: 1. New tracheostomy tube without complicating feature. 2. Otherwise stable chest. Electronically Signed   By: Monte Fantasia M.D.   On: 08/18/2019 11:22     Medications:     Scheduled Medications: . alteplase  2 mg Intracatheter  Once  . aspirin  81 mg Per Tube Daily  . atorvastatin  80 mg Per Tube Daily  . bisacodyl  10 mg Rectal Once  . chlorhexidine gluconate (MEDLINE KIT)  15 mL Mouth Rinse BID  . Chlorhexidine Gluconate Cloth  6 each Topical Daily  . clonazePAM  1 mg Per Tube BID  . docusate  100 mg Per Tube BID  . feeding supplement (PROSource TF)  90 mL Per Tube BID  . fentaNYL (SUBLIMAZE) injection  200 mcg Intravenous Once  . folic acid  1 mg Per Tube Daily  . furosemide  80 mg Intravenous BID  . gabapentin  300 mg Per Tube Q8H  . insulin aspart  0-15 Units Subcutaneous Q4H  . insulin aspart  3 Units Subcutaneous Q4H  . mouth rinse  15 mL Mouth Rinse 10 times per day  . midodrine  15 mg Per Tube TID WC  . multivitamin  15 mL Per Tube Daily  . oxybutynin  5 mg Per Tube BID  . pantoprazole (PROTONIX) IV  40 mg Intravenous Q12H  . polyethylene glycol  17 g Per Tube Daily  . quiNIDine sulfate  150 mg Per Tube Q6H  . sildenafil  20 mg Per Tube TID  . sodium chloride flush  10-40 mL Intracatheter Q12H  . thiamine  100 mg Per Tube Daily  . Warfarin - Pharmacist Dosing Inpatient   Does not apply q1600    Infusions: . sodium chloride    . sodium chloride 10 mL/hr at 08/21/2019 1100  . sodium chloride 20 mL/hr at 08/01/19 1700  . amiodarone 30 mg/hr (09-04-2019 0439)  . dexmedetomidine (PRECEDEX) IV infusion Stopped (08/16/19 1145)  . feeding supplement (VITAL 1.5 CAL) 1,000 mL (08/18/19 1158)  . fentaNYL infusion INTRAVENOUS 100 mcg/hr (2019-09-04 0438)  . heparin 1,350 Units/hr (2019-09-04 0000)  . meropenem (MERREM) IV Stopped (08/19/19 1840)  . phenylephrine (NEO-SYNEPHRINE) Adult infusion 60 mcg/min (2019-09-04 0000)  . vancomycin Stopped (08/19/19 2332)  . vasopressin 0.03 Units/min (09-04-2019 0000)    PRN Medications: Place/Maintain arterial line **AND** sodium chloride, sodium chloride, acetaminophen, fentaNYL, midazolam, sodium chloride flush   Assessment/Plan:    1. Refractory Ventricular  Fibrillation  -multiple episodes ~10 of VF on 7/28 - multiple episodes 7/30 am => IABP placed as well as TTVP given relative bradycardia.  IABP and TTVP now out.  Continued to have VF 7/30 , 7/31, 8/1, 8/3, 8/4, 8/6, 8/19.    - Had VF 08/19/19 but self terminated. - Back in VF this morning. Shock x1.   - Ranexa and Mexiletine currently on hold (unable to crush and administer through tube).  - Sotalol not a good option given his history of bradycardia.  - Lidocaine discontinued 8/8 - now on quinidine 150 mg q6h. No VT/VF since addition of quinidine. QT ok - Continue amiodarone gtt at 30 mg/hr.   - Keep K > 4.0 and Mg > 2.0.   2. Acute Systolic Heart Failure s/p VAD - EF 40%, RV systolic function mildly reduced - HMIII LVAD placed 06/25/19.  - RV looks good on echo, cannula ok  - Co-ox 79% . Maps 50-70s . Titrated Neo to 150 mcg. May need to restart norepi to keep Maps up.  -Continue on vasopressin 0.03 units. Keep Maps80-90s - CVP  19-20 .  Poor response to IV lasix with worsening renal function.  - Remains on midodrine 15 mg tid.  - Off digoxin with bradycardia/AKI.  - Continue Heparin gtt, back on warfarin. INR 1.3   -LDH 355>410   - VAD interrogated personally. Parameters stable. No events.  - on ASA 81 mg daily.   3. Acute hypoxic resp failure - intubated in setting of VF - failing vent wean.  -S/P Trach 08/18/19 - vent management per PCCM.    4. CAD - S/p Acute STEMI 6/21.Emergent cath showed severe multivessel disease w/ 75% mid- distal LM, 100% prox LAD, not amendable to PCI (unable to cross w/ guide wire, probable CTO), 100% prox LCx (probable CTO) and 100% ostial RCA (probable CTO). There were mildto moderate collaterals right to  left to the LAD, minimal right to right collateralsan minimalleft to left collaterals.LVEF 25% -Hs trop peaked 27,000. - No targets for CABG / PCI.  - S/P HMIII VAD - Continue ASA + statin  - No change  5. Renal mass = RCC - MRI  abdomen- 1.8 cm right renal lesion, most consistent with renal cell carcinoma. No evidence of renal vein involvement or nodal metastasis. - Discussed w/ Urology and R RCC is small and will just require surveillance. Ok to proceed with VAD from their standpoint.   6. Urinary retention - continue abx - repeat urine culture 8/12 NGTD - currently w/ condom cath but requiring frequent I/O caths  7. ID:  - ?HCAP => Klebsiella in trach aspirate from 7/29.  Sputum now also growing CNS - Completed cefepime 08/02/19, switched to ancef - now on meropenum (started 8/12), Eraxis (start 8/13- 8/16) and vanc (started 8/14-8/18) - Blood CX repeated 08/13/19, NGTD - continue current regimen   8. Anemia w/ Concern for GI Source  -mild rectal bleeding 8/8 and blood clot from mouth 8/10. Has had some bloody secretions.  - hgb stable 8.6  - on heparin anticoagulation for LVAD - EGD 8/11 unremarkable. No bleeding  - transfuse if hgb drops <7.0  9. AKI - Had AKI but resolved. Now with AKI in the setting hypotension.  Creatinine up to 3 and K 6.  Marked volume overload.  - Consult nephrology. Suspect he will need CVVHD. Will need to discuss with his sister.   10 Hypernatremia -Free water stopped.    11. Hyperkalemia  - K 6 - Give dose of Lokelma now.   Went into SB--> VF. Given 100 mcg fentanyl bolus  + 4 mg versed. Once sedated shocked x1 200j . Back in Sinus Brady. Increased neo and vasopressin. Need to keep Maps up. Would like to hold off on norepi but may need need to add if we are unable to keep Maps iup   I personally called his [name] to discuss events from this morning. She would to talk to Dr Haroldine Laws later this morning.    Darrick Grinder, NP-C  08-29-19  5:53 AM   Agree with above.   Had recurrent VF this am requiring a shock. Now essentially anuric despite lasix. K 6.0. Bradycardic after shock   General:  Ill appearing. Sedated. On vent throught trach HEENT: normal Neck: supple. JVP  to ear + trachCarotids 2+ bilat; no bruits.  Cor: PMI nondisplaced. Regular rate & rhythm. No rubs, gallops or murmurs. Lungs: coarse Abdomen: soft, nontender, nondistended. No hepatosplenomegaly. No bruits or masses. Good bowel sounds. Extremities: no cyanosis, clubbing, rash, 3+ edema Neuro: sedated on vent  Now with recurrent VF and AKI due to ATN. Remains on neo and VP but still hypotensive. Will add NE. Would be unable to tolerate CVVHD at this point.  Will discuss next steps with family regarding GOC.   CRITICAL CARE Performed by: Glori Bickers  Total critical care time: 45 minutes  Critical care time was exclusive of separately billable procedures and treating other patients.  Critical care was necessary to treat or prevent imminent or life-threatening deterioration.  Critical care was time spent personally by me (independent of midlevel providers or residents) on the following activities: development of treatment plan with patient and/or surrogate as well as nursing, discussions with consultants, evaluation of patient's response to treatment, examination of patient, obtaining history from patient or surrogate, ordering and performing treatments and interventions, ordering and review of laboratory  studies, ordering and review of radiographic studies, pulse oximetry and re-evaluation of patient's condition.  Glori Bickers, MD  8:37 AM

## 2019-09-02 NOTE — Progress Notes (Signed)
CSW met with family at bedside to offer support and condolences. Family appreciative of care and support of the VAD team. CSW available for grief resources as needed. Raquel Sarna, [place], [place]

## 2019-09-02 NOTE — Progress Notes (Signed)
Per [facility]/[place]) Patient no Tissue/Eye candidate. New referral [phone].

## 2019-09-02 DEATH — deceased

## 2022-04-10 IMAGING — DX DG CHEST 2V
2 series · 2 of 2 positions shown · non-contrast
Comparison: June 18, 2019.

CLINICAL DATA: Preoperative examination.

EXAM:
CHEST - 2 VIEW

[chest pa]
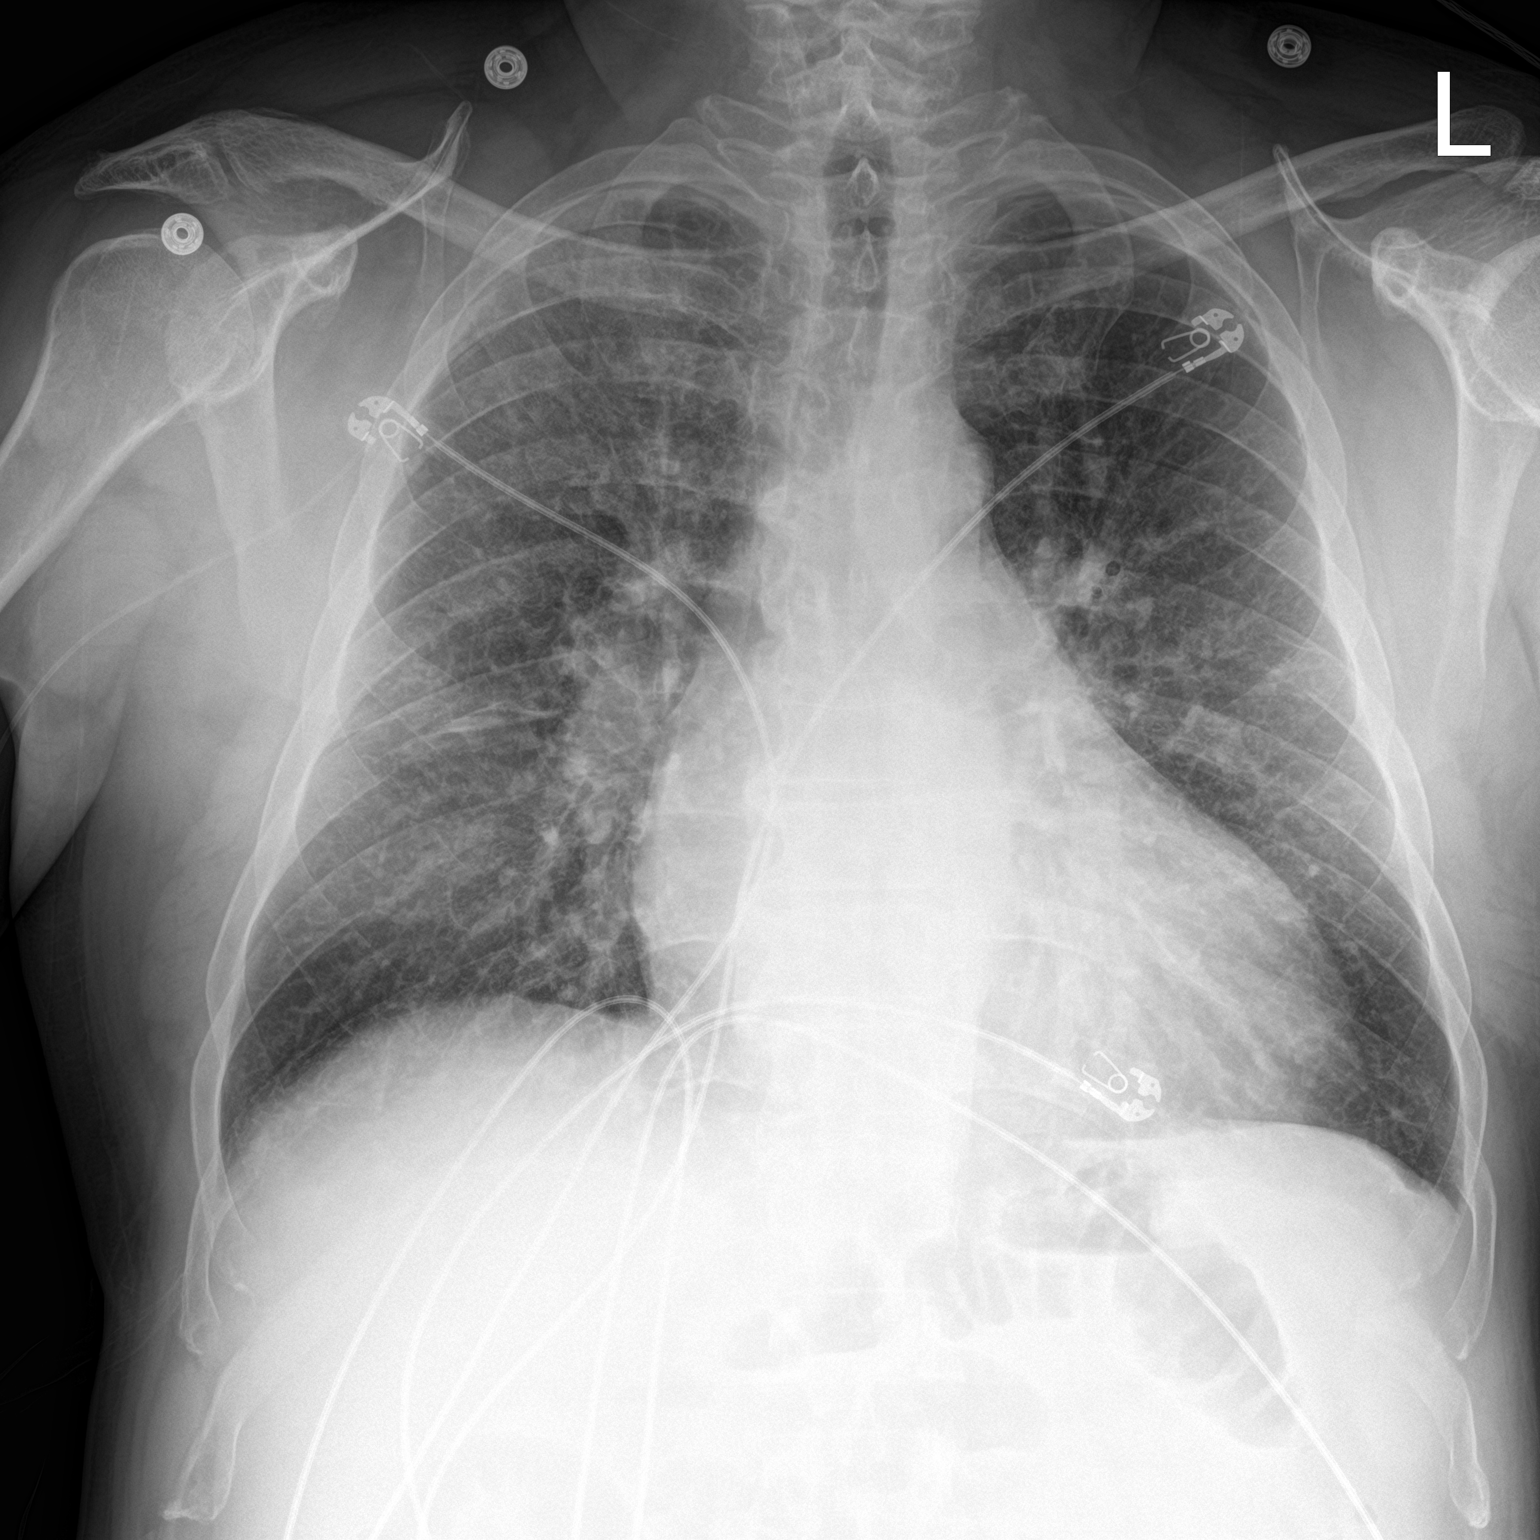

[chest lat]
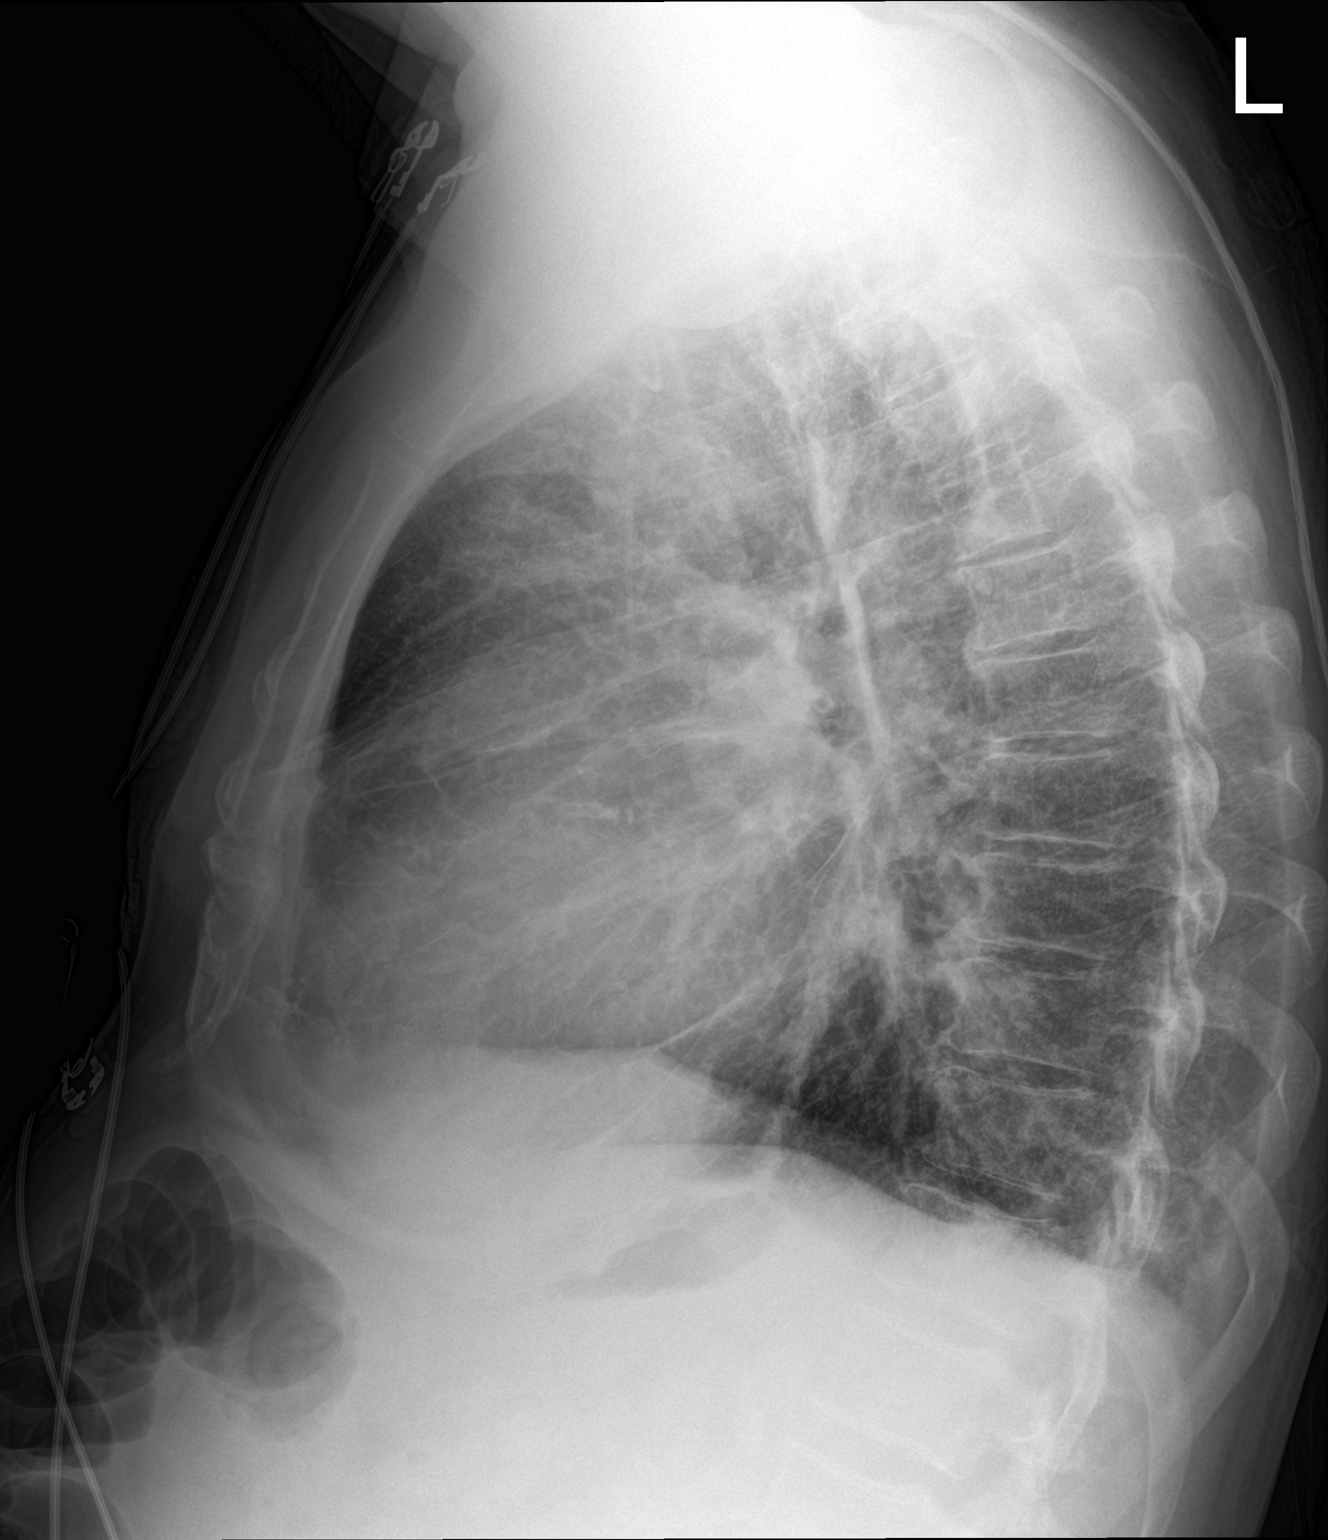

[2 of 2 positions shown; findings below may reference images not displayed]

FINDINGS: Stable cardiomegaly with mild central pulmonary vascular congestion.
No pneumothorax or pleural effusion is noted. No consolidative
process is noted. Right-sided PICC line was noted with distal tip in
expected position of the SVC. Bony thorax is unremarkable.
IMPRESSION: Stable cardiomegaly with mild central pulmonary vascular congestion.

## 2022-04-13 IMAGING — DX DG CHEST 1V PORT
1 series · 1 of 1 positions shown · non-contrast
Comparison: Radiograph yesterday.

CLINICAL DATA: Intra-aortic balloon pump.

EXAM:
PORTABLE CHEST 1 VIEW

[chest]
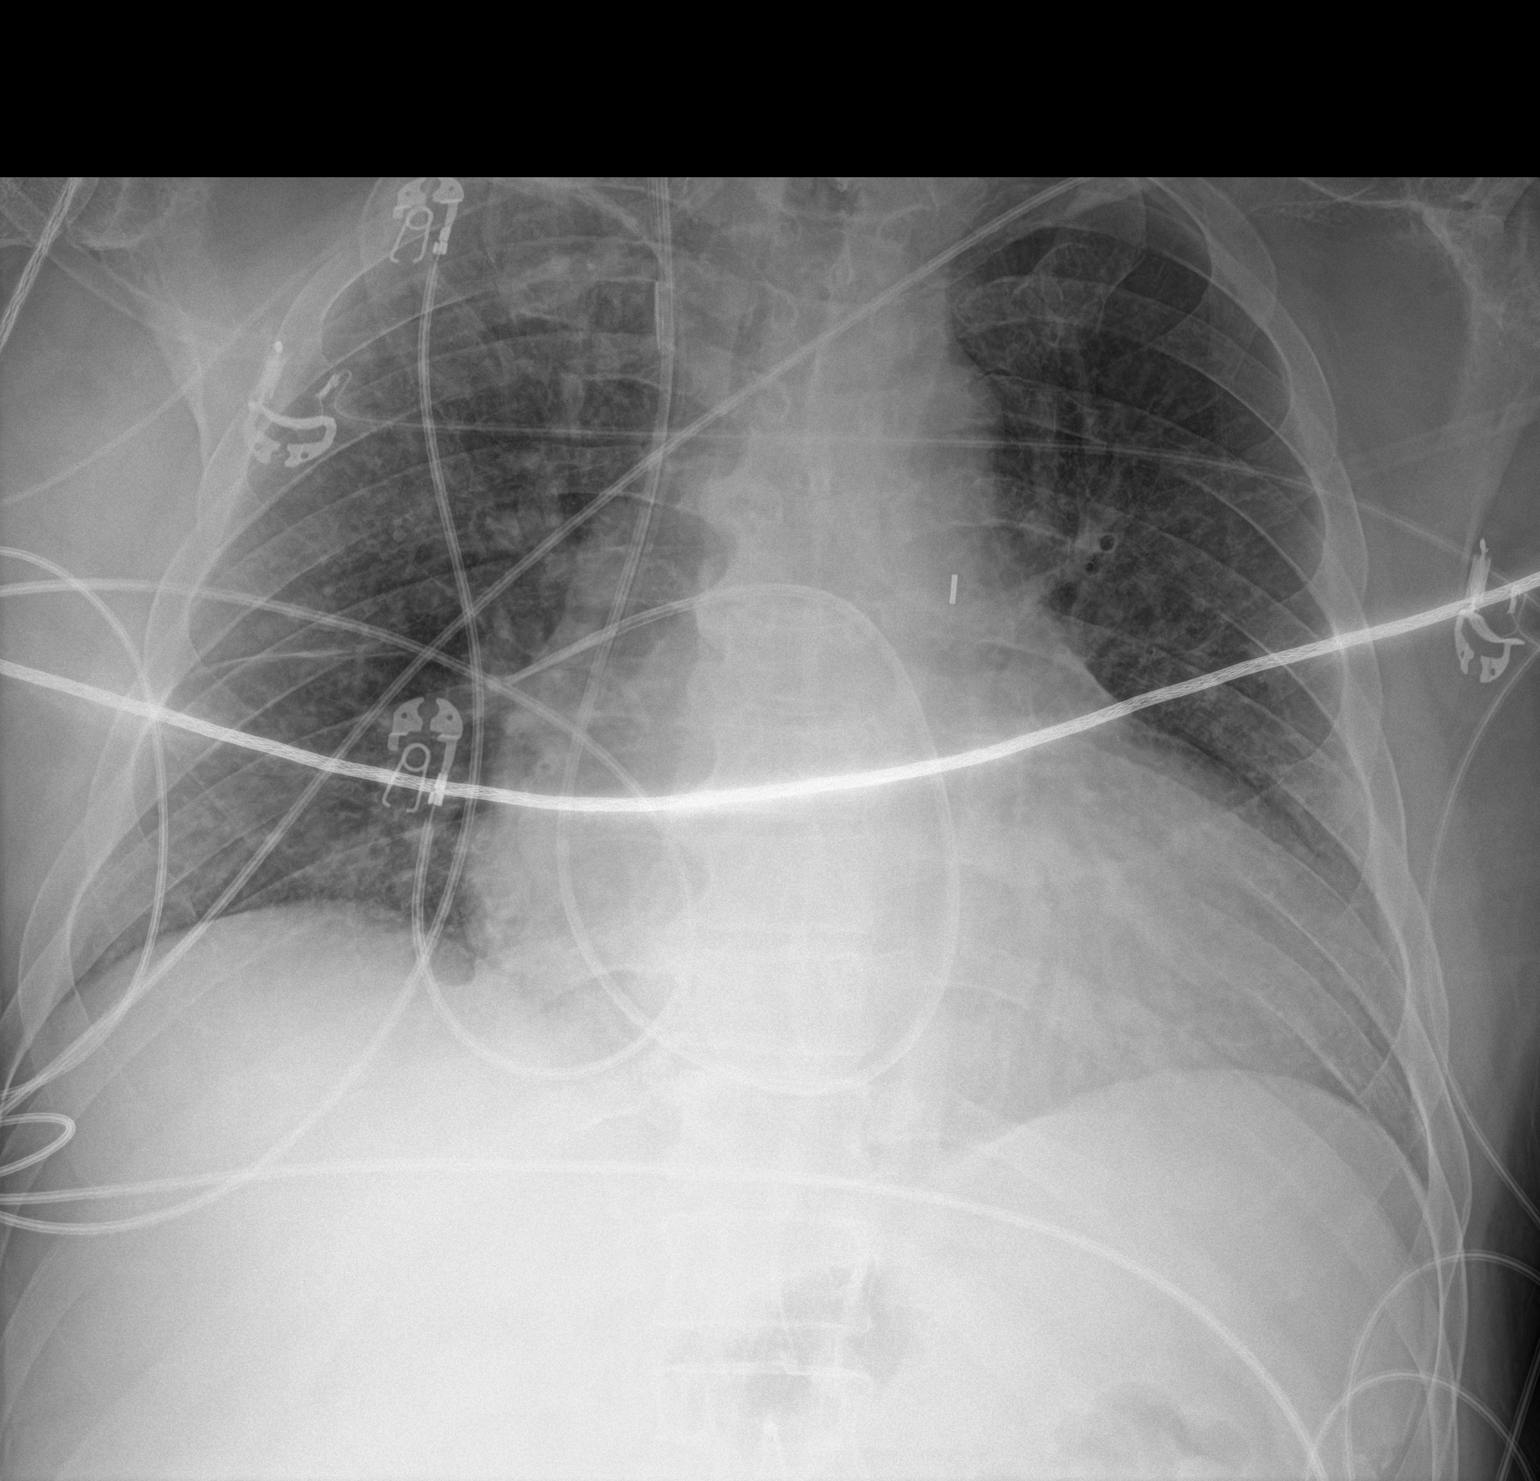

[1 of 1 positions shown; findings below may reference images not displayed]

FINDINGS: Tip of the intra-aortic balloon pump projects over the upper
descending aorta at the level of T6. This is unchanged from prior
exam. Right internal jugular Swan-Ganz catheter tip in the region of
the distal main pulmonary outflow tract. Cardiomegaly is stable.
Improved pulmonary edema, residual asymmetric greater on the right.
Trace fluid in the right minor fissure without large subpulmonic
effusion. No pneumothorax.
IMPRESSION: 1. Unchanged positioning of the intra-aortic balloon pump with tip
over the descending aorta at the level of T6.
2. Stable positioning of right internal jugular Swan-Ganz catheter.
3. Improved pulmonary edema, mild residual greater on the right.
Stable cardiomegaly.

## 2022-04-14 IMAGING — DX DG CHEST 1V PORT
1 series · 1 of 1 positions shown · non-contrast
Comparison: Radiograph 06/25/2019

CLINICAL DATA: ETT, LVAD

EXAM:
PORTABLE CHEST 1 VIEW

[chest ap]
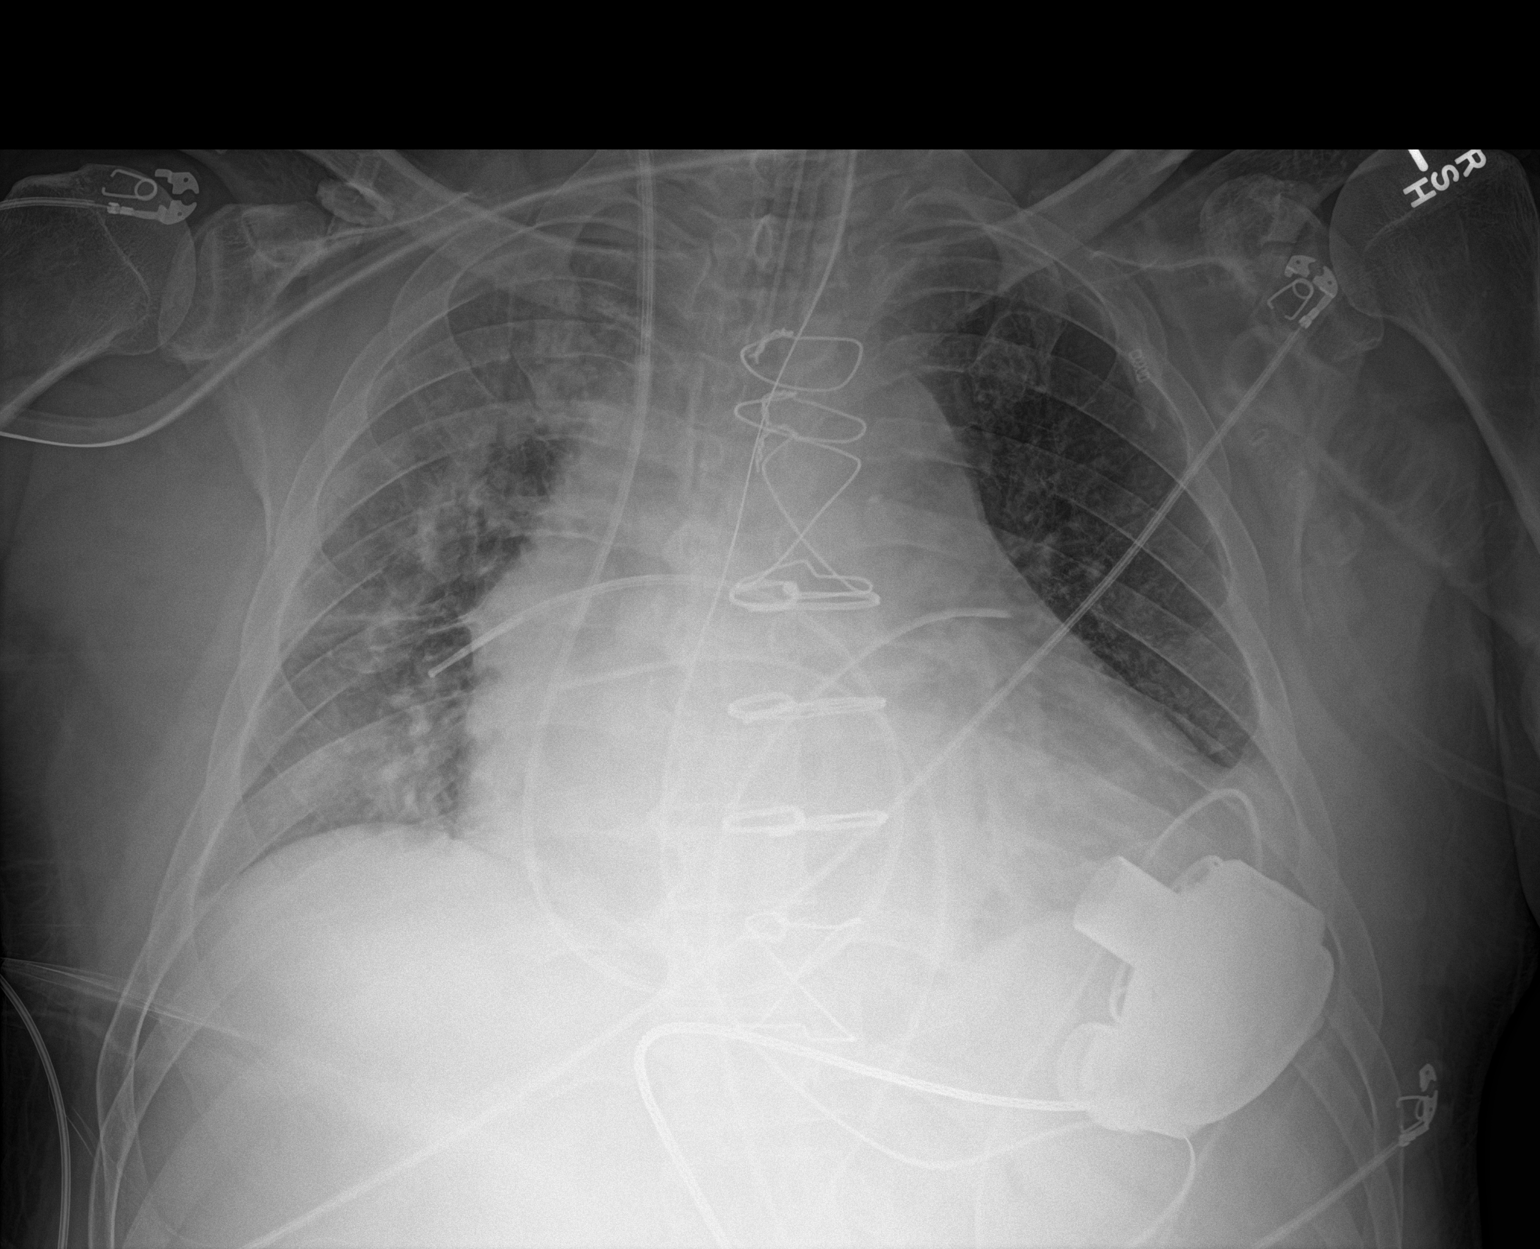

[1 of 1 positions shown; findings below may reference images not displayed]

FINDINGS: Endotracheal tube terminates in the mid trachea, approximately 5 cm
from the level of the carina. Transesophageal tube tip terminates
below the level of imaging, beyond the GE junction. Right IJ
catheter sheath with a Swan-Ganz catheter positioned likely in a
distal branch of the right pulmonary artery. Mediastinal drains in
place. Postsurgical changes from sternotomy. LVAD projects over the
cardiac apex with drive lead intact and coursing towards the midline
abdomen. Additional support devices overlie the chest.

There is some persistent heterogeneous opacity throughout the right
lung likely with some increasing volume loss in the right hemithorax
as well. Could reflect some edema or airspace disease likely with
superimposed atelectatic changes. Mild retrocardiac opacity could
reflect further atelectatic changes in the left lung, left lung
being otherwise clear. No pneumothorax or effusion. No other acute
osseous or soft tissue abnormality. Degenerative changes are present
in the imaged spine and shoulders.
IMPRESSION: 1. Persistent heterogeneous opacity throughout the right lung likely
with some increasing volume loss in the right hemithorax as well.
Could reflect some edema or airspace disease with superimposed
atelectatic changes.
2. Mild retrocardiac opacity could reflect further atelectatic
changes in the left lung.
3. Lines and tubes as above with LVAD at the cardiac apex.

## 2022-04-15 IMAGING — DX DG CHEST 1V PORT
1 series · 1 of 1 positions shown · non-contrast
Comparison: June 26, 2019

CLINICAL DATA: Evaluate pleural effusion and pneumothorax.

EXAM:
PORTABLE CHEST 1 VIEW

[chest]
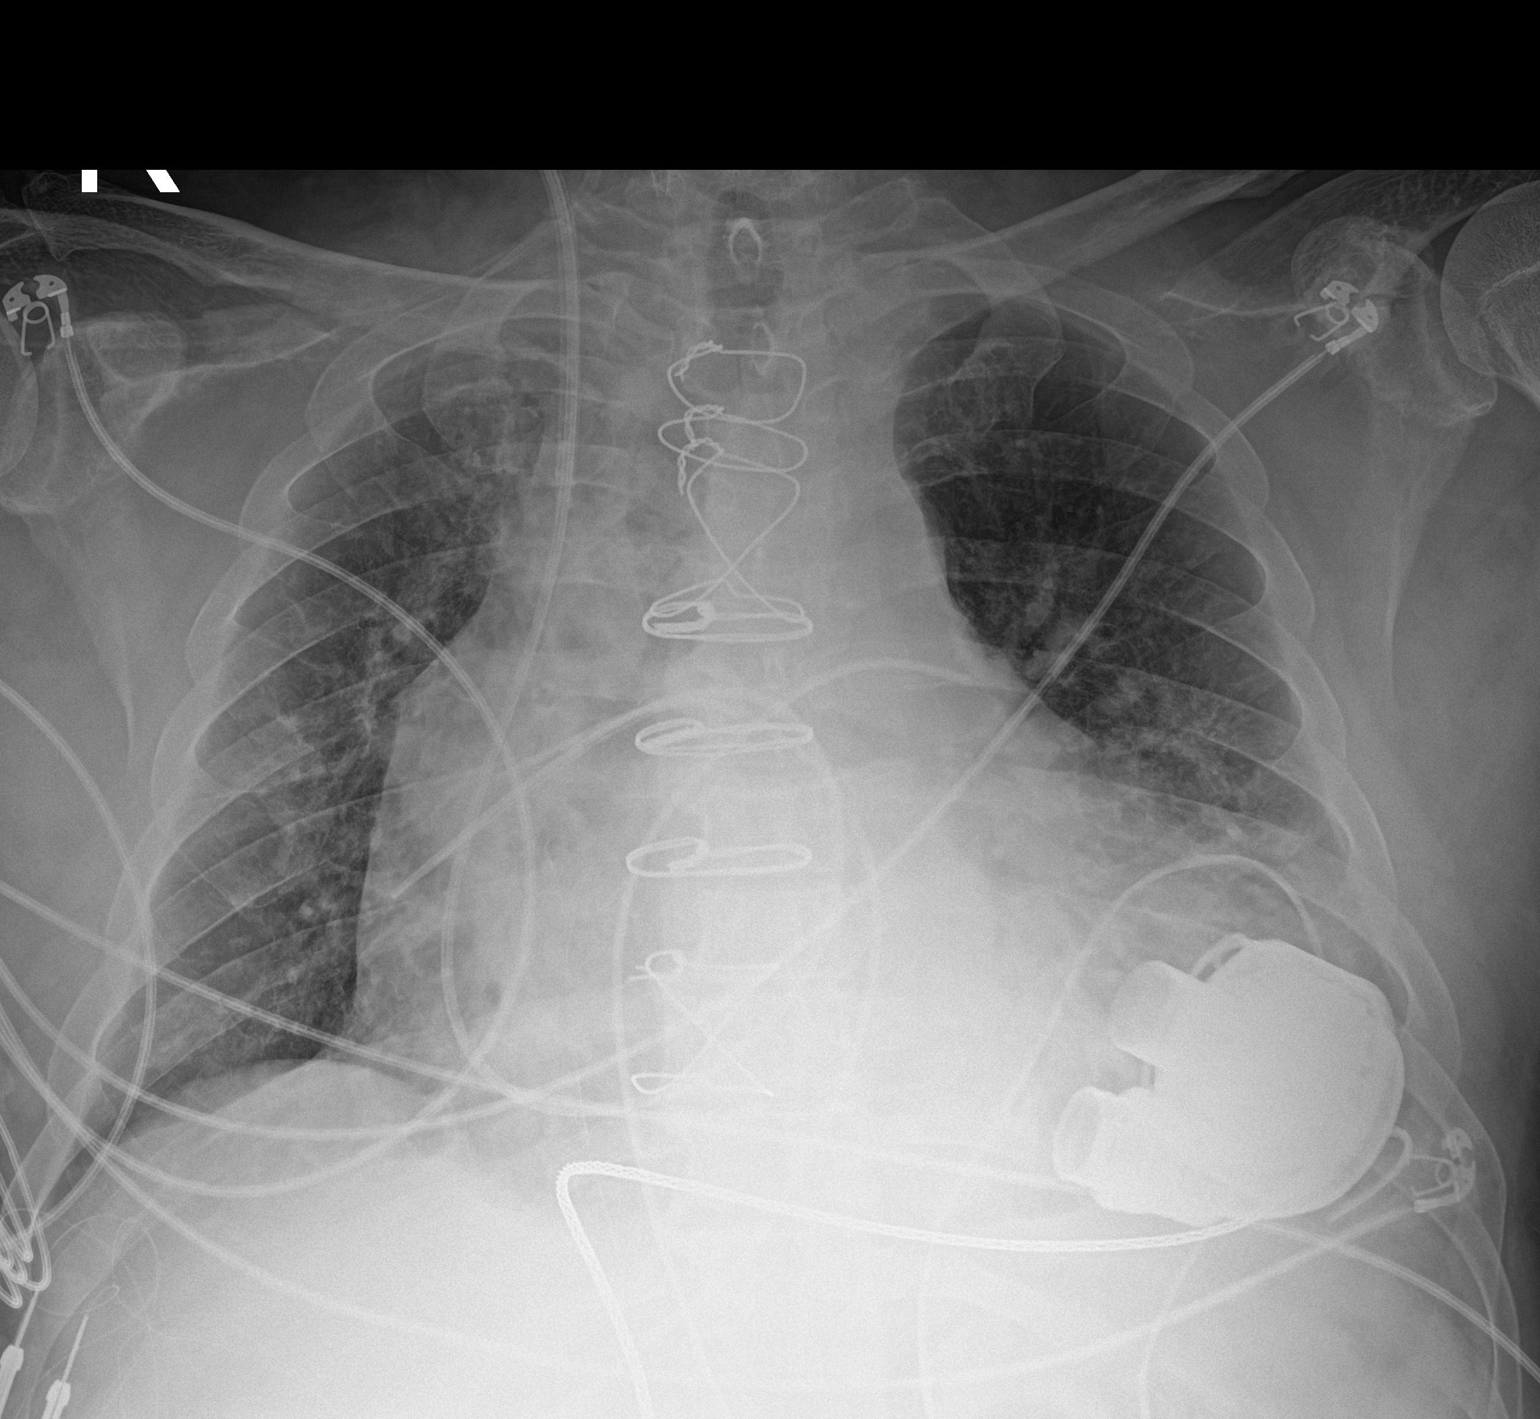

[1 of 1 positions shown; findings below may reference images not displayed]

FINDINGS: The ET and NG tubes have been removed. An LVAD remains in place. The
PA catheter terminates in the right lower lobe pulmonary artery,
unchanged. No pneumothorax. Mild opacity in left base is probably
atelectasis. Stable cardiomegaly. No other change.
IMPRESSION: 1. Support apparatus as above.
2. Mild opacity in left base is probably atelectasis. Recommend
attention on follow-up.
